# Patient Record
Sex: Female | Born: 1955 | Race: Black or African American | Hispanic: No | Smoking: Former smoker
Health system: Southern US, Community
[De-identification: ages and names within clinical notes are randomized; demographics above are authoritative.]

## PROBLEM LIST (undated history)

## (undated) DIAGNOSIS — N184 Chronic kidney disease, stage 4 (severe): Secondary | ICD-10-CM

## (undated) DIAGNOSIS — M1990 Unspecified osteoarthritis, unspecified site: Secondary | ICD-10-CM

## (undated) DIAGNOSIS — J449 Chronic obstructive pulmonary disease, unspecified: Secondary | ICD-10-CM

## (undated) DIAGNOSIS — E785 Hyperlipidemia, unspecified: Secondary | ICD-10-CM

## (undated) DIAGNOSIS — R05 Cough: Secondary | ICD-10-CM

## (undated) DIAGNOSIS — J209 Acute bronchitis, unspecified: Secondary | ICD-10-CM

## (undated) DIAGNOSIS — R059 Cough, unspecified: Secondary | ICD-10-CM

## (undated) DIAGNOSIS — J45909 Unspecified asthma, uncomplicated: Secondary | ICD-10-CM

## (undated) DIAGNOSIS — E039 Hypothyroidism, unspecified: Secondary | ICD-10-CM

## (undated) DIAGNOSIS — I10 Essential (primary) hypertension: Secondary | ICD-10-CM

## (undated) DIAGNOSIS — G4733 Obstructive sleep apnea (adult) (pediatric): Secondary | ICD-10-CM

## (undated) DIAGNOSIS — Z8669 Personal history of other diseases of the nervous system and sense organs: Secondary | ICD-10-CM

## (undated) DIAGNOSIS — K219 Gastro-esophageal reflux disease without esophagitis: Secondary | ICD-10-CM

## (undated) DIAGNOSIS — Z860100 Personal history of colon polyps, unspecified: Secondary | ICD-10-CM

## (undated) DIAGNOSIS — E119 Type 2 diabetes mellitus without complications: Secondary | ICD-10-CM

## (undated) DIAGNOSIS — D649 Anemia, unspecified: Secondary | ICD-10-CM

## (undated) DIAGNOSIS — G629 Polyneuropathy, unspecified: Secondary | ICD-10-CM

## (undated) DIAGNOSIS — I209 Angina pectoris, unspecified: Secondary | ICD-10-CM

## (undated) HISTORY — DX: Acute bronchitis, unspecified: J20.9

## (undated) HISTORY — PX: COLONOSCOPY: SHX174

## (undated) HISTORY — DX: Obstructive sleep apnea (adult) (pediatric): G47.33

## (undated) HISTORY — PX: TUBAL LIGATION: SHX77

## (undated) HISTORY — DX: Type 2 diabetes mellitus without complications: E11.9

## (undated) HISTORY — PX: ANTERIOR FUSION CERVICAL SPINE: SUR626

## (undated) HISTORY — DX: Cough: R05

## (undated) HISTORY — DX: Cough, unspecified: R05.9

## (undated) HISTORY — PX: CORONARY ANGIOPLASTY: SHX604

## (undated) HISTORY — PX: CARPAL TUNNEL RELEASE: SHX101

## (undated) HISTORY — DX: Hypothyroidism, unspecified: E03.9

## (undated) HISTORY — PX: WISDOM TOOTH EXTRACTION: SHX21

---

## 1988-08-08 HISTORY — PX: TUBAL LIGATION: SHX77

## 1999-08-23 ENCOUNTER — Other Ambulatory Visit: Admission: RE | Admit: 1999-08-23 | Discharge: 1999-08-23 | Payer: Self-pay | Admitting: General Practice

## 1999-10-05 ENCOUNTER — Other Ambulatory Visit: Admission: RE | Admit: 1999-10-05 | Discharge: 1999-10-05 | Payer: Self-pay | Admitting: Gynecology

## 1999-10-05 ENCOUNTER — Encounter (INDEPENDENT_AMBULATORY_CARE_PROVIDER_SITE_OTHER): Payer: Self-pay | Admitting: Specialist

## 2001-10-10 ENCOUNTER — Encounter: Admission: RE | Admit: 2001-10-10 | Discharge: 2001-10-10 | Payer: Self-pay | Admitting: Internal Medicine

## 2003-01-17 ENCOUNTER — Ambulatory Visit (HOSPITAL_BASED_OUTPATIENT_CLINIC_OR_DEPARTMENT_OTHER): Admission: RE | Admit: 2003-01-17 | Discharge: 2003-01-17 | Payer: Self-pay | Admitting: Internal Medicine

## 2003-03-07 ENCOUNTER — Ambulatory Visit (HOSPITAL_COMMUNITY): Admission: RE | Admit: 2003-03-07 | Discharge: 2003-03-07 | Payer: Self-pay | Admitting: Gastroenterology

## 2003-03-07 ENCOUNTER — Ambulatory Visit (HOSPITAL_BASED_OUTPATIENT_CLINIC_OR_DEPARTMENT_OTHER): Admission: RE | Admit: 2003-03-07 | Discharge: 2003-03-07 | Payer: Self-pay | Admitting: Internal Medicine

## 2003-03-07 ENCOUNTER — Encounter (INDEPENDENT_AMBULATORY_CARE_PROVIDER_SITE_OTHER): Payer: Self-pay | Admitting: *Deleted

## 2003-12-22 ENCOUNTER — Ambulatory Visit (HOSPITAL_COMMUNITY): Admission: RE | Admit: 2003-12-22 | Discharge: 2003-12-22 | Payer: Self-pay | Admitting: Gastroenterology

## 2003-12-23 ENCOUNTER — Encounter: Admission: RE | Admit: 2003-12-23 | Discharge: 2003-12-23 | Payer: Self-pay | Admitting: Gastroenterology

## 2004-09-07 ENCOUNTER — Ambulatory Visit: Payer: Self-pay | Admitting: Internal Medicine

## 2004-09-24 ENCOUNTER — Encounter: Admission: RE | Admit: 2004-09-24 | Discharge: 2004-09-24 | Payer: Self-pay | Admitting: Endocrinology

## 2004-11-08 ENCOUNTER — Ambulatory Visit: Payer: Self-pay | Admitting: Internal Medicine

## 2004-12-17 ENCOUNTER — Ambulatory Visit: Payer: Self-pay | Admitting: Internal Medicine

## 2004-12-17 LAB — PULMONARY FUNCTION TEST

## 2005-01-14 ENCOUNTER — Ambulatory Visit: Payer: Self-pay | Admitting: Internal Medicine

## 2005-07-08 ENCOUNTER — Ambulatory Visit: Payer: Self-pay | Admitting: Internal Medicine

## 2005-08-08 HISTORY — PX: CORONARY STENT INTERVENTION: CATH118234

## 2005-12-09 ENCOUNTER — Ambulatory Visit: Payer: Self-pay | Admitting: Internal Medicine

## 2006-05-18 ENCOUNTER — Ambulatory Visit (HOSPITAL_COMMUNITY): Admission: AD | Admit: 2006-05-18 | Discharge: 2006-05-19 | Payer: Self-pay | Admitting: Cardiovascular Disease

## 2006-06-09 ENCOUNTER — Ambulatory Visit: Payer: Self-pay | Admitting: Internal Medicine

## 2006-12-07 ENCOUNTER — Ambulatory Visit: Payer: Self-pay | Admitting: Internal Medicine

## 2007-05-09 ENCOUNTER — Ambulatory Visit: Payer: Self-pay | Admitting: Pulmonary Disease

## 2007-05-28 DIAGNOSIS — I2510 Atherosclerotic heart disease of native coronary artery without angina pectoris: Secondary | ICD-10-CM | POA: Insufficient documentation

## 2007-05-28 DIAGNOSIS — E119 Type 2 diabetes mellitus without complications: Secondary | ICD-10-CM | POA: Insufficient documentation

## 2007-05-28 DIAGNOSIS — J209 Acute bronchitis, unspecified: Secondary | ICD-10-CM | POA: Insufficient documentation

## 2007-05-28 DIAGNOSIS — J302 Other seasonal allergic rhinitis: Secondary | ICD-10-CM | POA: Insufficient documentation

## 2007-05-28 DIAGNOSIS — R059 Cough, unspecified: Secondary | ICD-10-CM | POA: Insufficient documentation

## 2007-05-28 DIAGNOSIS — R05 Cough: Secondary | ICD-10-CM | POA: Insufficient documentation

## 2007-05-28 DIAGNOSIS — J3089 Other allergic rhinitis: Secondary | ICD-10-CM

## 2007-05-28 DIAGNOSIS — G4733 Obstructive sleep apnea (adult) (pediatric): Secondary | ICD-10-CM | POA: Insufficient documentation

## 2007-07-04 ENCOUNTER — Ambulatory Visit: Payer: Self-pay | Admitting: Internal Medicine

## 2007-08-09 HISTORY — PX: ANTERIOR FUSION CERVICAL SPINE: SUR626

## 2007-11-08 ENCOUNTER — Encounter: Payer: Self-pay | Admitting: Internal Medicine

## 2007-11-09 ENCOUNTER — Telehealth: Payer: Self-pay | Admitting: Internal Medicine

## 2007-12-10 ENCOUNTER — Ambulatory Visit: Payer: Self-pay | Admitting: Internal Medicine

## 2007-12-10 DIAGNOSIS — E039 Hypothyroidism, unspecified: Secondary | ICD-10-CM | POA: Insufficient documentation

## 2008-01-04 ENCOUNTER — Encounter: Payer: Self-pay | Admitting: Internal Medicine

## 2008-03-26 ENCOUNTER — Ambulatory Visit (HOSPITAL_COMMUNITY): Admission: RE | Admit: 2008-03-26 | Discharge: 2008-03-27 | Payer: Self-pay | Admitting: Neurological Surgery

## 2008-03-28 ENCOUNTER — Telehealth: Payer: Self-pay | Admitting: Internal Medicine

## 2008-04-29 ENCOUNTER — Encounter: Admission: RE | Admit: 2008-04-29 | Discharge: 2008-04-29 | Payer: Self-pay | Admitting: Neurological Surgery

## 2008-05-31 ENCOUNTER — Inpatient Hospital Stay (HOSPITAL_COMMUNITY): Admission: EM | Admit: 2008-05-31 | Discharge: 2008-06-05 | Payer: Self-pay | Admitting: Internal Medicine

## 2008-05-31 ENCOUNTER — Encounter: Payer: Self-pay | Admitting: Emergency Medicine

## 2008-05-31 ENCOUNTER — Ambulatory Visit: Payer: Self-pay | Admitting: Internal Medicine

## 2008-06-05 ENCOUNTER — Encounter: Payer: Self-pay | Admitting: Internal Medicine

## 2008-06-09 ENCOUNTER — Ambulatory Visit: Payer: Self-pay | Admitting: Internal Medicine

## 2008-06-24 ENCOUNTER — Encounter: Admission: RE | Admit: 2008-06-24 | Discharge: 2008-06-24 | Payer: Self-pay | Admitting: Neurological Surgery

## 2008-06-28 ENCOUNTER — Encounter: Admission: RE | Admit: 2008-06-28 | Discharge: 2008-06-28 | Payer: Self-pay | Admitting: Neurological Surgery

## 2008-07-11 ENCOUNTER — Telehealth: Payer: Self-pay | Admitting: Internal Medicine

## 2008-08-07 ENCOUNTER — Other Ambulatory Visit: Admission: RE | Admit: 2008-08-07 | Discharge: 2008-08-07 | Payer: Self-pay | Admitting: Gynecology

## 2008-09-15 ENCOUNTER — Telehealth (INDEPENDENT_AMBULATORY_CARE_PROVIDER_SITE_OTHER): Payer: Self-pay | Admitting: *Deleted

## 2008-11-06 ENCOUNTER — Telehealth: Payer: Self-pay | Admitting: Internal Medicine

## 2008-12-08 ENCOUNTER — Ambulatory Visit: Payer: Self-pay | Admitting: Internal Medicine

## 2009-01-07 ENCOUNTER — Encounter: Payer: Self-pay | Admitting: Internal Medicine

## 2009-01-13 ENCOUNTER — Telehealth: Payer: Self-pay | Admitting: Internal Medicine

## 2009-01-22 ENCOUNTER — Telehealth (INDEPENDENT_AMBULATORY_CARE_PROVIDER_SITE_OTHER): Payer: Self-pay | Admitting: *Deleted

## 2009-01-22 ENCOUNTER — Ambulatory Visit: Payer: Self-pay | Admitting: Internal Medicine

## 2009-02-12 ENCOUNTER — Encounter: Admission: RE | Admit: 2009-02-12 | Discharge: 2009-02-12 | Payer: Self-pay | Admitting: Cardiology

## 2009-02-13 ENCOUNTER — Ambulatory Visit (HOSPITAL_COMMUNITY): Admission: RE | Admit: 2009-02-13 | Discharge: 2009-02-13 | Payer: Self-pay | Admitting: Cardiovascular Disease

## 2009-04-03 ENCOUNTER — Telehealth: Payer: Self-pay | Admitting: Internal Medicine

## 2009-04-07 ENCOUNTER — Ambulatory Visit: Payer: Self-pay | Admitting: Internal Medicine

## 2009-04-07 DIAGNOSIS — J019 Acute sinusitis, unspecified: Secondary | ICD-10-CM | POA: Insufficient documentation

## 2009-05-04 ENCOUNTER — Telehealth: Payer: Self-pay | Admitting: Internal Medicine

## 2009-05-11 ENCOUNTER — Ambulatory Visit: Payer: Self-pay | Admitting: Internal Medicine

## 2009-06-19 ENCOUNTER — Ambulatory Visit: Payer: Self-pay | Admitting: Diagnostic Radiology

## 2009-06-19 ENCOUNTER — Ambulatory Visit (HOSPITAL_BASED_OUTPATIENT_CLINIC_OR_DEPARTMENT_OTHER): Admission: RE | Admit: 2009-06-19 | Discharge: 2009-06-19 | Payer: Self-pay | Admitting: Endocrinology

## 2009-07-01 ENCOUNTER — Telehealth: Payer: Self-pay | Admitting: Internal Medicine

## 2009-07-10 ENCOUNTER — Telehealth (INDEPENDENT_AMBULATORY_CARE_PROVIDER_SITE_OTHER): Payer: Self-pay | Admitting: *Deleted

## 2009-08-10 ENCOUNTER — Telehealth (INDEPENDENT_AMBULATORY_CARE_PROVIDER_SITE_OTHER): Payer: Self-pay | Admitting: *Deleted

## 2009-09-08 ENCOUNTER — Telehealth (INDEPENDENT_AMBULATORY_CARE_PROVIDER_SITE_OTHER): Payer: Self-pay | Admitting: *Deleted

## 2009-09-25 ENCOUNTER — Telehealth: Payer: Self-pay | Admitting: Internal Medicine

## 2009-11-09 ENCOUNTER — Ambulatory Visit: Payer: Self-pay | Admitting: Internal Medicine

## 2010-01-07 ENCOUNTER — Telehealth (INDEPENDENT_AMBULATORY_CARE_PROVIDER_SITE_OTHER): Payer: Self-pay | Admitting: *Deleted

## 2010-02-03 ENCOUNTER — Telehealth: Payer: Self-pay | Admitting: Internal Medicine

## 2010-03-08 ENCOUNTER — Telehealth (INDEPENDENT_AMBULATORY_CARE_PROVIDER_SITE_OTHER): Payer: Self-pay | Admitting: *Deleted

## 2010-05-10 ENCOUNTER — Ambulatory Visit: Payer: Self-pay | Admitting: Internal Medicine

## 2010-07-09 ENCOUNTER — Telehealth: Payer: Self-pay | Admitting: Internal Medicine

## 2010-08-29 ENCOUNTER — Encounter: Payer: Self-pay | Admitting: Endocrinology

## 2010-09-07 NOTE — Progress Notes (Signed)
Summary: wanting cough syrup  Phone Note Call from Patient Call back at Home Phone [phone]   Caller: Patient Call For: young Reason for Call: Refill Medication Summary of Call: patient is wanting her cough syrup refilled.  hydromet Initial call taken by: Adin Hector,  September 08, 2009 12:29 PM  Follow-up for Phone Call        pt last had rx filled 08-10-2009 for # 363ml x 0 refills.  Please advise. Matthew Folks LPN  February  1, 624THL 1:27 PM   Additional Follow-up for Phone Call Additional follow up Details #1::        Ok this time and one refill, but she must make this last until her next appt in April. Additional Follow-up by: Deneise Lever MD,  September 08, 2009 1:47 PM    Additional Follow-up for Phone Call Additional follow up Details #2::    called, spoke with pt.  Pt informed of above statement per CY and requesting med to be called into [facility].  Med called in-pt aware.  Raymondo Band RN  September 08, 2009 2:02 PM   Prescriptions: HYDROMET 5-1.5 MG/5ML  SYRP (HYDROCODONE-HOMATROPINE) take one teaspoonful every 6 hours as needed  #300 ML x 1   Entered by:   Raymondo Band RN   Authorized by:   Deneise Lever MD   Signed by:   Raymondo Band RN on 09/08/2009   Method used:   Telephoned to ...       [id number] (retail)       [address]       [place],   [address]       Ph: [phone] or [phone]       Fax: [phone]   RxID:   [id number]

## 2010-09-07 NOTE — Progress Notes (Signed)
Summary: rx  Phone Note Call from Patient Call back at Home Phone [phone]   Caller: Patient Call For: Armanie Martine Reason for Call: Talk to Nurse Summary of Call: pt would like her Hydromet cough syrup called in.  She realizes it's a little early, but she doesn't want to wait due to Holiday and she doesnt want to run out over w/e. [facility] [place] Initial call taken by: Zigmund Gottron,  February 03, 2010 10:24 AM  Follow-up for Phone Call        Pt just had hydromet filled on 6/2 # 300 ml.  She is requesting early refill so she does not run out over the w/e since it's a holiday.  Please advise thanks Follow-up by: Tilden Dome,  February 03, 2010 10:31 AM  Additional Follow-up for Phone Call Additional follow up Details #1::        per CY---ok for refill of the hydromet.  this has been called into the pharmacy. Elita Boone CMA  February 03, 2010 1:25 PM     Prescriptions: HYDROMET 5-1.5 MG/5ML  SYRP (HYDROCODONE-HOMATROPINE) take one teaspoonful every 6 hours as needed  #300 ML x 0   Entered by:   Elita Boone CMA   Authorized by:   Deneise Lever MD   Signed by:   Elita Boone CMA on 02/03/2010   Method used:   Telephoned to ...       [id number] (retail)       [address]       [place], [place]  [address]       Ph: [phone] or [phone]       Fax: [phone]   RxID:   [id number]

## 2010-09-07 NOTE — Assessment & Plan Note (Signed)
Summary: rov 6 months///kp   Primary Provider/Referring Provider:  [place]  CC:  6 month follow up visit-sinus headache.  History of Present Illness: 01/22/09- Chronic cough, allergic rhinitis, asthmatic bronchitis, hx OSA 2 weeks dry cough, nasal congestion, burning in chest "like getting bronchitis" without palpitation, radiation or exertional component, fever or change in her chronic hormonal flashes. Denies nausea or vomiting and disputes my questions that this could be reflux.. Easily winded with exertion. She usedup her cough syrup prematurely- discussed. April 07, 2009--Presents for an acute office visit. Complains of hoarseness, sinus congestion/pressure with dark green mucus tinged with bright red blood, PND with sore throat and a sensation that she has something in her throat x10days. Using mucinex without much help. Dizziness w/ bending over. Denies chest pain.  May 11, 2009-  Chronic cough, allergic rhinitis, asthmatic bronchitis, hx OSA Since treated here in August with Augmentin by the NP, she still feels tender pressure ache left paranasal area and can pull a little bloody tinged mucus form nose. No longer blowing brown. Some pain behind left ear and mild vertigo if she blows her nose. Cough persists. If she doesn't take cough syrup she feels a tickle low throat and rough "bronchitis" sense in mid chest. Easily dyspneic climbing stairs without change over past year. She had another negative heart cath by Dr Claiborne Billings because of unexplained left anterior chest pains. She denies any sense of reflux or heart burn.  November 09, 2009 - Chronic cough, allergic rhinitis, asthmatic bronchitis, hx OSA Cpap compliance good- every night-13 cwp. Good control. Uses Advair daily and hasn't needed rescue inhaler in 2 months. Got through winter without significant respiratory infection. Today complains of left maxillary headache/ pressure with little discharge. She is using mucinex and neti  pot. Chronic cough persists. pattern has not changed. Currently it is less obtrusive and she is making cough syrup last.  Current Medications (verified): 1)  Synthroid 175 Mcg  Tabs (Levothyroxine Sodium) .... One Once Daily 2)  Verapamil Hcl Cr 240 Mg  Cp24 (Verapamil Hcl) .... Take 1 Tablet By Mouth Once A Day 3)  Voltaren 75 Mg  Tbec (Diclofenac Sodium) .... 2 Times Weekly As Needed 4)  Advair Diskus 100-50 Mcg/dose  Misc (Fluticasone-Salmeterol) .... Use Twice Daily = Rinse Mouth Well After Use 5)  Cpap 13 Cwp 6)  Nexium 40 Mg  Cpdr (Esomeprazole Magnesium) .Marland Kitchen.. 1 Once Daily 7)  Vytorin 10-80 Mg Tabs (Ezetimibe-Simvastatin) .... Take 1 By Mouth Once Daily 8)  Proair Hfa 108 (90 Base) Mcg/act Aers (Albuterol Sulfate) .... Inhale 2 Puffs Every Four Hours As Needed 9)  Hydromet 5-1.5 Mg/42ml  Syrp (Hydrocodone-Homatropine) .... Take One Teaspoonful Every 6 Hours As Needed 10)  Plavix 75 Mg  Tabs (Clopidogrel Bisulfate) .... Once Daily 11)  Triamterene-Hctz 37.5-25 Mg Tabs (Triamterene-Hctz) .... Take 1 By Mouth Once Daily 12)  Fexofenadine Hcl 180 Mg Tabs (Fexofenadine Hcl) .... Take 1 Tablet By Mouth Once A Day 13)  Mucinex 600 Mg Xr12h-Tab (Guaifenesin) .... Take As Directed 14)  Toprol Xl 25 Mg Xr24h-Tab (Metoprolol Succinate) .... Take 1 By Mouth At Bedtime 15)  Fish Oil 1000 Mg Caps (Omega-3 Fatty Acids) .... 2 By Mouth Two Times A Day 16)  Metformin Hcl 1000 Mg Tabs (Metformin Hcl) .Marland Kitchen.. 1 Two Times A Day 17)  Augmentin 875-125 Mg Tabs (Amoxicillin-Pot Clavulanate) .Marland Kitchen.. 1 By Mouth Two Times A Day 18)  Victoza 18 Mg/74ml Soln (Liraglutide) .... Use As Directed  Allergies (verified): 1)  !  Sulfa 2)  ! 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  Risk Factors: Smoking Status: quit (12/10/2007)  Family History: Mother- died hypertensive heart disease age 43 Father- died hip fracture and pneumonia age 110, with prostate cancer  Review of Systems      See HPI       The patient complains of prolonged cough and headaches.  The patient denies anorexia, fever, weight loss, weight gain, vision loss, decreased hearing, hoarseness, chest pain, syncope, dyspnea on exertion, peripheral edema, hemoptysis, abdominal pain, and severe indigestion/heartburn.    Vital Signs:  Patient profile:   55 year old female Height:      62 inches Weight:      168.13 pounds BMI:     30.86 O2 Sat:      98 % on Room air Pulse rate:   81 / minute BP sitting:   126 / 76  (left arm) Cuff size:   regular  Vitals Entered By: Clayborne Dana CMA December 04, 2009 10:05 AM)  O2 Flow:  Room air  Physical Exam  Additional Exam:  General: A/Ox3; pleasant and cooperative, NAD, overweight SKIN: no rash, lesions NODES: no lymphadenopathy HEENT: /AT, EOM- WNL, M-dull, especially on right, but not retracted, don't see fluid., Nose- turbinates pale, swollen, shiney,  Throat- .voice normal without stridor or erythema, Mallampati III, Tongue and mucosa look normal, NECK: Supple w/ fair ROM, JVD- none, normal carotid impulses w/o  bruits Thyroid-  CHEST: Clear to P&A, without wheeze or rhonchi heard, no cough while here HEART: RRR, no m/g/r heard ABDOMEN:soft [id number], nl pulses, no edema  NEURO: Grossly intact to observation      Impression & Recommendations:  Problem # 1:  SINUSITIS, ACUTE (ICD-461.9)  Acute maxillary sinusitis. Suspect original problem was allergic rhinitis causing obstruction. We will give neb and depo with augmentin. Her updated medication list for this problem includes:    Hydromet 5-1.5 Mg/42ml Syrp (Hydrocodone-homatropine) .Marland Kitchen... Take one teaspoonful every 6 hours as needed    Mucinex 600 Mg Xr12h-tab (Guaifenesin) .Marland Kitchen... Take as directed    Augmentin 875-125 Mg Tabs (Amoxicillin-pot clavulanate) .Marland Kitchen... 1 by mouth two times a day    Augmentin 875-125 Mg Tabs (Amoxicillin-pot clavulanate) .Marland Kitchen... 1 twice daily  Problem # 2:  OBSTRUCTIVE SLEEP APNEA (ICD-327.23) Compliant with CPAP with good control.  Problem # 3:  COUGH, CHRONIC (ICD-786.2) Chronic cough, multifactorial and nonspecific by previous workup. We continue to emphasize careful use of narcotic cough syrup.  Medications Added to Medication List This Visit: 1)  Vytorin 10-80 Mg Tabs (Ezetimibe-simvastatin) .... Take 1 by mouth once daily 2)  Triamterene-hctz 37.5-25 Mg Tabs (Triamterene-hctz) .... Take 1 by mouth once daily 3)  Augmentin 875-125 Mg Tabs (Amoxicillin-pot clavulanate) .Marland Kitchen.. 1 twice daily  Other Orders: Admin of  Therapeutic Inj  intramuscular or subcutaneous [id number]) Depo- Medrol 80mg  (J1040) Nebulizer Tx [id number])  Patient Instructions: 1)  Please schedule a follow-up appointment in 6 months. 2)  Neb neo 3)  Script for cough syrup 4)  Script for antibiotic sent to your drug store 5)  Continue with Neti pot and mucinex. A decongestant like otc Sudafed-PE may also help. Prescriptions: HYDROMET 5-1.5 MG/5ML  SYRP (HYDROCODONE-HOMATROPINE) take one teaspoonful every 6 hours as needed  #300 ML x 1   Entered and  Authorized by:   Deneise Lever MD   Signed by:   Deneise Lever MD on 11/09/2009   Method used:   Print then Give to Patient   RxID:   [id number] AUGMENTIN 875-125 MG TABS (AMOXICILLIN-POT CLAVULANATE) 1 twice daily  #14 x 0   Entered and Authorized by:   Deneise Lever MD   Signed by:   Deneise Lever MD on 11/09/2009   Method used:   Electronically to        [id number] (retail)       [address]       [place], [place]  [address]       Ph: [phone] or [phone]       Fax: [phone]   RxID:   [id number]      Medication Administration  Injection # 1:    Medication: Depo- Medrol 80mg     Diagnosis: SINUSITIS, ACUTE (ICD-461.9)    Route: SQ    Site: RUOQ gluteus    Exp Date: 06/2012    Lot #: [id number]    Mfr: Pharmacia    Patient tolerated injection without complications    Given by: Clayborne Dana CMA (November 09, 2009 1:47 PM)  Medication # 1:    Medication: EMR miscellaneous medications    Diagnosis: SINUSITIS, ACUTE (ICD-461.9)    Dose: 3 drops    Route: intranasal    Exp Date: 12/2010    Lot #: [id number]    Mfr: bayer    Comments: Neo-Synephrine.    Patient tolerated medication without complications    Given by: Clayborne Dana CMA (November 09, 2009 1:47 PM)  Orders Added: 1)  Admin of Therapeutic Inj  intramuscular or subcutaneous [[id number]] 2)  Depo- Medrol 80mg  [J1040] 3)  Nebulizer Tx [id number]

## 2010-09-07 NOTE — Progress Notes (Signed)
Summary: rx  Phone Note Call from Patient Call back at Home Phone [phone]   Caller: Patient Call For: young Reason for Call: Refill Medication, Talk to Nurse Summary of Call: wants a refill on Hydrocodone cough syrup. [facility] [phone] Initial call taken by: Zigmund Gottron,  August 10, 2009 11:37 AM  Follow-up for Phone Call        please advise if Okay to refill. Last filled on 07-01-09. Monterey Bing CMA  August 10, 2009 11:39 AM   Additional Follow-up for Phone Call Additional follow up Details #1::        Per CDY- ok to fill this time. Clayborne Dana CMA  August 10, 2009 12:42 PM   cough syrup called to pharmacy and pt aware Additional Follow-up by: Maryann Conners CMA,  August 10, 2009 2:00 PM    Prescriptions: HYDROMET 5-1.5 MG/5ML  SYRP (HYDROCODONE-HOMATROPINE) take one teaspoonful every 6 hours as needed  #300 ML x 0   Entered by:   Maryann Conners CMA   Authorized by:   Deneise Lever MD   Signed by:   Maryann Conners CMA on 08/10/2009   Method used:   Telephoned to ...       [id number] (retail)       [address]       [place], [place]  [address]       Ph: [phone] or [phone]       Fax: [phone]   RxID:   [id number]

## 2010-09-07 NOTE — Progress Notes (Signed)
Summary: rx sub  Phone Note Call from Patient Call back at Home Phone [phone]   Caller: Patient Call For: Khrystyne Arpin Summary of Call: pt states that the allegra D is no longer covered by her insurance. says that they will cover fexofena HCL. [facility] [place].  Initial call taken by: Cooper Render, CNA,  September 25, 2009 2:54 PM  Follow-up for Phone Call        please advise if ok to change from allegra to fexofenadine hcl. If so what strength and directions. Thanks. Noble Bing CMA  September 25, 2009 3:29 PM   Additional Follow-up for Phone Call Additional follow up Details #1::        Per CDY- fexofenadine 180mg  #30 take 1 by mouth once daily refill as needed Clayborne Dana CMA  September 25, 2009 4:33 PM    called and spoke with pt and she is aware of rx sent to her pharmacy [facility]  September 25, 2009 4:36 PM     New/Updated Medications: FEXOFENADINE HCL 180 MG TABS (FEXOFENADINE HCL) Take 1 tablet by mouth once a day Prescriptions: FEXOFENADINE HCL 180 MG TABS (FEXOFENADINE HCL) Take 1 tablet by mouth once a day  #30 x 5   Entered by:   Elita Boone CMA   Authorized by:   Deneise Lever MD   Signed by:   Elita Boone CMA on 09/25/2009   Method used:   Electronically to        [id number] (retail)       [address]       [place], [place]  [address]       Ph: [phone] or [phone]       Fax: [phone]   RxID:   [id number]

## 2010-09-07 NOTE — Progress Notes (Signed)
Summary: hydromet refill//  Phone Note Call from Patient Call back at Home Phone [phone]   Caller: Patient Call For: young Reason for Call: Refill Medication Summary of Call: Requests refill on hydrocodone ok to [facility].//[facility] Initial call taken by: Netta Neat,  July 09, 2010 1:33 PM  Follow-up for Phone Call        Called, spoke with pt.  She states since last OV on 05/10/10 with CY, she is still having problems with bronchitis.  States she was seen by PCP at the end of oct and was given another abx and a prednisone shot and in the middle of November, PCP called in another abx.  States she is still coughing despite this.  States at times "I cough and can't quit."  Cough is prod at times with green mucus.  States she has appt with PCP next wk and is planning on discussing this with him then but in the meantime requesting rx for hydromet.  Dr. Annamaria Boots, pls advise if this is ok.  Thanks! Follow-up by: Raymondo Band RN,  July 09, 2010 3:44 PM  Additional Follow-up for Phone Call Additional follow up Details #1::        Per CDY-okay to refill Hydromet cough syrup 3360ml 1 tsp qid as needed no refills.Clayborne Dana CMA  July 09, 2010 4:00 PM   pt aware rx sent. Verdon Bing CMA  July 09, 2010 4:30 PM     Prescriptions: HYDROMET 5-1.5 MG/5ML  SYRP (HYDROCODONE-HOMATROPINE) take one teaspoonful every 6 hours as needed  #300 ML x 0   Entered by:   Charma Igo   Authorized by:   Deneise Lever MD   Signed by:   Charma Igo on 07/09/2010   Method used:   Historical   [id number]  [facility]. attempt to call pt to inform her of this. lmomtcb x1 Mindy Silva  July 09, 2010 4:14 PM

## 2010-09-07 NOTE — Progress Notes (Signed)
Summary: rx req  Phone Note Call from Patient Call back at Home Phone [phone]   Caller: Patient Call For: [name] Summary of Call: pt requests a refill of hydromet cough syrup. also requests a rx for diflucan- yeast in mouth/ tongue. [facility] on [address] in [place].  Initial call taken by: Cooper Render, CNA,  January 07, 2010 1:07 PM  Follow-up for Phone Call        called and spoke with pt.  pt requesting refill on Hydromet.  Last filled on 11-09-2009 for # 353ml x 1 refill.  Pt c/o non-productive cough but will occ cough up clear sputum.  Pt also requests rx for Diflucan.   Pt states she has thrush in her mouth d/t Advair.  pt c/o tongue sore and white patches on tongue.  Please advise.  Thanks.  Megan Reynolds LPN  June  2, 624THL D34-534 PM     Additional Follow-up for Phone Call Additional follow up Details #1::        1) OK to refill Hydromet x 1  2) OK to send diflucan generic, 150 mg, # 7,  1 daily x 7 days, no refill. Additional Follow-up by: Deneise Lever MD,  January 07, 2010 1:33 PM    Additional Follow-up for Phone Call Additional follow up Details #2::    Rxs were phoned to [facility] in [place].  Spoke with pt and advised that this was done. Follow-up by: Tilden Dome,  January 07, 2010 1:58 PM  New/Updated Medications: FLUCONAZOLE 150 MG TABS (FLUCONAZOLE) 1 by mouth once daily x 7 days Prescriptions: FLUCONAZOLE 150 MG TABS (FLUCONAZOLE) 1 by mouth once daily x 7 days  #7 x 0   Entered by:   Tilden Dome   Authorized by:   Deneise Lever MD   Signed by:   Tilden Dome on 01/07/2010   Method used:   Telephoned to ...       [facility] (retail)       [address]       [place], [place]  [address]       Ph: [phone] or [phone]       Fax: [phone]   RxID:   [id number] HYDROMET 5-1.5 MG/5ML  SYRP (HYDROCODONE-HOMATROPINE) take one teaspoonful every 6 hours as needed  #300 ML x 0   Entered by:   Tilden Dome  Authorized by:   Deneise Lever MD   Signed by:   Tilden Dome on 01/07/2010   Method used:   Telephoned to ...       [facility] (retail)       [address]       [place], [place]  [address]       Ph: [phone] or [phone]       Fax: [phone]   RxID:   [id number]

## 2010-09-07 NOTE — Assessment & Plan Note (Signed)
Summary: rov 6 months///kp   Primary Provider/Referring Provider:  [place]  CC:  6 month follow up visit-recent Bronchitis.Marland Kitchen  History of Present Illness: May 11, 2009-  Chronic cough, allergic rhinitis, asthmatic bronchitis, hx OSA Since treated here in August with Augmentin by the NP, she still feels tender pressure ache left paranasal area and can pull a little bloody tinged mucus form nose. No longer blowing brown. Some pain behind left ear and mild vertigo if she blows her nose. Cough persists. If she doesn't take cough syrup she feels a tickle low throat and rough "bronchitis" sense in mid chest. Easily dyspneic climbing stairs without change over past year. She had another negative heart cath by Dr Claiborne Billings because of unexplained left anterior chest pains. She denies any sense of reflux or heart burn.  November 09, 2009 - Chronic cough, allergic rhinitis, asthmatic bronchitis, hx OSA Cpap compliance good- every night-13 cwp. Good control. Uses Advair daily and hasn't needed rescue inhaler in 2 months. Got through winter without significant respiratory infection. Today complains of left maxillary headache/ pressure with little discharge. She is using mucinex and neti pot. Chronic cough persists. pattern has not changed. Currently it is less obtrusive and she is making cough syrup last.  May 10, 2010- Chronic cough, allergic rhinitis, asthmatic bronchitis Acute visit- cc Bronchitis. She is driving a school bus. Child was coughing on her all week. By Thursday 5 days ago she felt a cold coming on. Next day she went to her CP, Dr Forde Dandy and was givien augmentin, a neb tx, prednisone taper. Chest tight and burns, nonproductive, no GI upset. Scant suput and sinus mucus are green. No fever.   Preventive Screening-Counseling & Management  Alcohol-Tobacco     Smoking Status: quit     Year Quit: 2007  Current Medications (verified): 1)  Synthroid 175 Mcg  Tabs (Levothyroxine Sodium) .... One  Once Daily 2)  Verapamil Hcl Cr 240 Mg  Cp24 (Verapamil Hcl) .... Take 1 Tablet By Mouth Once A Day 3)  Voltaren 75 Mg  Tbec (Diclofenac Sodium) .... 2 Times Weekly As Needed 4)  Advair Diskus 100-50 Mcg/dose  Misc (Fluticasone-Salmeterol) .... Use Twice Daily = Rinse Mouth Well After Use 5)  Cpap 13 Cwp 6)  Nexium 40 Mg  Cpdr (Esomeprazole Magnesium) .Marland Kitchen.. 1 Once Daily 7)  Vytorin 10-80 Mg Tabs (Ezetimibe-Simvastatin) .... Take 1 By Mouth Once Daily 8)  Proair Hfa 108 (90 Base) Mcg/act Aers (Albuterol Sulfate) .... Inhale 2 Puffs Every Four Hours As Needed 9)  Hydromet 5-1.5 Mg/14ml  Syrp (Hydrocodone-Homatropine) .... Take One Teaspoonful Every 6 Hours As Needed 10)  Plavix 75 Mg  Tabs (Clopidogrel Bisulfate) .... Once Daily 11)  Triamterene-Hctz 37.5-25 Mg Tabs (Triamterene-Hctz) .... Take 1 By Mouth Once Daily 12)  Fexofenadine Hcl 180 Mg Tabs (Fexofenadine Hcl) .... Take 1 Tablet By Mouth Once A Day 13)  Mucinex 600 Mg Xr12h-Tab (Guaifenesin) .... Take As Directed 14)  Toprol Xl 25 Mg Xr24h-Tab (Metoprolol Succinate) .... Take 1 By Mouth At Bedtime 15)  Fish Oil 1000 Mg Caps (Omega-3 Fatty Acids) .... 2 By Mouth Two Times A Day 16)  Metformin Hcl 1000 Mg Tabs (Metformin Hcl) .Marland Kitchen.. 1 Two Times A Day 17)  Victoza 18 Mg/23ml Soln (Liraglutide) .... Use As Directed  Allergies (verified): 1)  ! Sulfa 2)  ! Codeine  Past History:  Past Medical History: Last updated: 06/09/2008 HYPOTHYROIDISM (ICD-244.9) COUGH, CHRONIC (ICD-786.2) ASTHMATIC BRONCHITIS, ACUTE (ICD-466.0) CAD (ICD-414.00) OBSTRUCTIVE  SLEEP APNEA (ICD-327.23) DIABETES MELLITUS, TYPE II (ICD-250.00) ALLERGIC RHINITIS (ICD-477.9) degenerative disk disease in the cervical spine requiring Neurontin Hosp pneumonia 10/09  Past Surgical History: Last updated: 06/09/2008 tubal ligation surgery on her toes wisdom teeth extracted Anterior cervical spine fusion- 03/26/08  Family History: Last updated: 11/23/2009 Mother- died  hypertensive heart disease age 29 Father- died hip fracture and pneumonia age 61, with prostate cancer  Social History: Last updated: 04/07/2009 Patient states former smoker; x42yrs quit in 2005; 1ppd.  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  Risk Factors: Smoking Status: quit (05/10/2010)  Review of Systems      See HPI       The patient complains of shortness of breath with activity, non-productive cough, sore throat, and nasal congestion/difficulty breathing through nose.  The patient denies shortness of breath at rest, coughing up blood, chest pain, irregular heartbeats, acid heartburn, indigestion, loss of appetite, weight change, abdominal pain, difficulty swallowing, tooth/dental problems, headaches, sneezing, itching, ear ache, and change in color of mucus.    Vital Signs:  Patient profile:   55 year old female Height:      62 inches Weight:      156.50 pounds BMI:     28.73 O2 Sat:      97 % on Room air Pulse rate:   78 / minute BP sitting:   150 / 80  (right arm) Cuff size:   regular  Vitals Entered By: Clayborne Dana CMA (May 10, 2010 9:29 AM)  O2 Flow:  Room air CC: 6 month follow up visit-recent Bronchitis.   Physical Exam  Additional Exam:  General: A/Ox3; pleasant and cooperative, NAD, overweight SKIN: no rash, lesions NODES: no lymphadenopathy HEENT: [place]/AT, EOM- WNL, M-dull, especially on right, but not retracted, don't see fluid., Nose- turbinates pale, mucus bridging,  Throat- .voice normal without stridor or erythema, Mallampati III, Tongue and mucosa look normal, NECK: Supple w/ fair ROM, JVD- none, normal carotid impulses w/o bruits Thyroid-  CHEST: Bilateral upper zone wheeze, unlabored. No rhonchii HEART: RRR, no m/g/r heard ABDOMEN:soft [id number], nl pulses, no edema  NEURO: Grossly intact to observation      Impression & Recommendations:  Problem #  1:  ASTHMATIC BRONCHITIS, ACUTE (ICD-466.0)  This began as a viral illness, but the green suggests she might respond to doxycycline and a neb with depo. She is prone to yeast vaginitis and I will give diflucan to hold.  The following medications were removed from the medication list:    Augmentin 875-125 Mg Tabs (Amoxicillin-pot clavulanate) .Marland Kitchen... 1 by mouth two times a day    Augmentin 875-125 Mg Tabs (Amoxicillin-pot clavulanate) .Marland Kitchen... 1 twice daily Her updated medication list for this problem includes:    Advair Diskus 100-50 Mcg/dose Misc (Fluticasone-salmeterol) ..... Use twice daily = rinse mouth well after use    Proair Hfa 108 (90 Base) Mcg/act Aers (Albuterol sulfate) ..... Inhale 2 puffs every four hours as needed    Hydromet 5-1.5 Mg/37ml Syrp (Hydrocodone-homatropine) .Marland Kitchen... Take one teaspoonful every 6 hours as needed    Mucinex 600 Mg Xr12h-tab (Guaifenesin) .Marland Kitchen... Take as directed    Doxycycline Hyclate 100 Mg Caps (Doxycycline hyclate) .Marland Kitchen... 2 today then one daily  Problem # 2:  SINUSITIS, ACUTE (ICD-461.9)  She is headed toward a sinusitis but not definitie yet. Discussed need someties to treat longer , depending how she does.  The following medications were  removed from the medication list:    Augmentin 875-125 Mg Tabs (Amoxicillin-pot clavulanate) .Marland Kitchen... 1 by mouth two times a day    Augmentin 875-125 Mg Tabs (Amoxicillin-pot clavulanate) .Marland Kitchen... 1 twice daily Her updated medication list for this problem includes:    Hydromet 5-1.5 Mg/37ml Syrp (Hydrocodone-homatropine) .Marland Kitchen... Take one teaspoonful every 6 hours as needed    Mucinex 600 Mg Xr12h-tab (Guaifenesin) .Marland Kitchen... Take as directed    Doxycycline Hyclate 100 Mg Caps (Doxycycline hyclate) .Marland Kitchen... 2 today then one daily  Problem # 3:  OBSTRUCTIVE SLEEP APNEA (ICD-327.23)  She continues to use CPAP alll night every night  Medications Added to Medication List This Visit: 1)  Doxycycline Hyclate 100 Mg Caps (Doxycycline hyclate)  .... 2 today then one daily 2)  Fluconazole 150 Mg Tabs (Fluconazole) .Marland Kitchen.. 1 daily for yeast  Other Orders: Est. Patient Level IV [id number]) Prescription Created Electronically [phone]) Admin of Therapeutic Inj  intramuscular or subcutaneous [id number]) Depo- Medrol 80mg  (J1040) Nebulizer Tx [id number])  Patient Instructions: 1)  Please schedule a follow-up appointment in 6 months. 2)  neb xop 1.25 3)  depo 80 4)  Sent script for antibiotic doxycycline 5)  Print scripts for diflucan/ fluconazole, and for cough syrup Prescriptions: HYDROMET 5-1.5 MG/5ML  SYRP (HYDROCODONE-HOMATROPINE) take one teaspoonful every 6 hours as needed  #300 ML x 0   Entered and Authorized by:   Deneise Lever MD   Signed by:   Deneise Lever MD on 05/10/2010   Method used:   Print then Give to Patient   [id number] FLUCONAZOLE 150 MG TABS (FLUCONAZOLE) 1 daily for yeast  #7 x 0   Entered and Authorized by:   Deneise Lever MD   Signed by:   Deneise Lever MD on 05/10/2010   Method used:   Print then Give to Patient   [id number] DOXYCYCLINE HYCLATE 100 MG CAPS (DOXYCYCLINE HYCLATE) 2 today then one daily  #8 x 1   Entered and Authorized by:   Deneise Lever MD   Signed by:   Deneise Lever MD on 05/10/2010   Method used:   Electronically to        [id number] (retail)       [address]       [place], [place]  [address]       Ph: [phone] or [phone]       Fax: [phone]   RxID:   [id number]      Medication Administration  Injection # 1:    Medication: Depo- Medrol 80mg     Diagnosis: ASTHMATIC BRONCHITIS, ACUTE (ICD-466.0)    Route: SQ    Site: RUOQ gluteus    Exp Date: 11/2012    Lot #: [id number]    Mfr: Pharmacia    Patient tolerated injection without complications    Given by: Clayborne Dana CMA (May 10, 2010 12:30 PM)  Medication # 1:    Medication: Xopenex 1.25mg     Diagnosis: ASTHMATIC BRONCHITIS, ACUTE  (ICD-466.0)    Dose: 1 vial    Route: inhaled    Exp Date: 03/2011    Lot #: [id number]    Mfr: Sepracor    Patient tolerated medication without complications    Given by: Clayborne Dana CMA (May 10, 2010 12:31 PM)  Orders Added: 1)  Est. Patient Level IV [id number] 2)  Prescription Created  Electronically [G8553] 3)  Admin of Therapeutic Inj  intramuscular or subcutaneous [[id number]] 4)  Depo- Medrol 80mg  [J1040] 5)  Nebulizer Tx [id number]

## 2010-09-07 NOTE — Progress Notes (Signed)
Summary: cough syrup  Phone Note Call from Patient   Caller: Patient Call For: young Summary of Call: need hydrocodone cough syrup refilled [place] Initial call taken by: Gustavus Bryant,  March 08, 2010 2:28 PM  Follow-up for Phone Call        Spoke with pt.  She states her cough is "the same"- no better or worse.  She would like a refill on her hydromet cough syrup.  Please advise if okay thanks! allergic to sulfa and codiene Follow-up by: Tilden Dome,  March 08, 2010 3:48 PM  Additional Follow-up for Phone Call Additional follow up Details #1::        OK to refill. Ask her to please try to stretch it out as far as possible each time. Additional Follow-up by: Deneise Lever MD,  March 08, 2010 5:16 PM    Additional Follow-up for Phone Call Additional follow up Details #2::    Rx was called to pharm.  Spoke with pt and made aware that this was done and advised per Dr Annamaria Boots, to try ro stretch this out each time she has ot filled.  Pt verbalized understanding. Follow-up by: Tilden Dome,  March 08, 2010 5:27 PM  Prescriptions: HYDROMET 5-1.5 MG/5ML  SYRP (HYDROCODONE-HOMATROPINE) take one teaspoonful every 6 hours as needed  #300 ML x 0   Entered by:   Tilden Dome   Authorized by:   Deneise Lever MD   Signed by:   Tilden Dome on 03/08/2010   Method used:   Telephoned to ...       [id number] (retail)       [address]       [place], [place]  [address]       Ph: [phone] or [phone]       Fax: [phone]   RxID:   [id number]

## 2010-09-22 ENCOUNTER — Telehealth: Payer: Self-pay | Admitting: Internal Medicine

## 2010-09-29 NOTE — Progress Notes (Signed)
Summary: yeast in mouth-requesting meds  Phone Note Call from Patient Call back at Home Phone [phone]   Caller: Patient Call For: Hilja Kintzel Summary of Call: pt c/o yeast in mouth (from advair). requests rx for diflucan. [facility] [place] [facility] [address].  Initial call taken by: Cooper Render, CNA,  September 22, 2010 9:04 AM  Follow-up for Phone Call        Pt c/o mouth feeling raw, burning, sores with white patches on insides and roof of mouth, and tongue x 1 week. Requesting medication called to pharmacy. Please advise. Thanks. Iran Planas CMA  September 22, 2010 9:50 AM  Allergies (verified):  1)  ! Sulfa 2)  ! Codeine  Additional Follow-up for Phone Call Additional follow up Details #1::        Per CDY-okay to give Diflucan 150mg  #3 take 1 by mouth daily x 3 days no refills .Clayborne Dana CMA  September 22, 2010 10:31 AM   Rx sent to pharmacy. lmomtcb x 1. Iran Planas CMA  September 22, 2010 10:48 AM   Pt informed. Iran Planas CMA  September 22, 2010 11:40 AM     New/Updated Medications: FLUCONAZOLE 150 MG TABS (FLUCONAZOLE) Take 1 tablet by mouth once a day x 3 days Prescriptions: FLUCONAZOLE 150 MG TABS (FLUCONAZOLE) Take 1 tablet by mouth once a day x 3 days  #3 x 0   Entered by:   Iran Planas CMA   Authorized by:   Deneise Lever MD   Signed by:   Iran Planas CMA on 09/22/2010   Method used:   Electronically to        [id number] (retail)       [address]       [place], [place]  [address]       Ph: [phone] or [phone]       Fax: [phone]   RxID:   [id number]

## 2010-11-04 ENCOUNTER — Encounter: Payer: Self-pay | Admitting: Internal Medicine

## 2010-11-08 ENCOUNTER — Ambulatory Visit (INDEPENDENT_AMBULATORY_CARE_PROVIDER_SITE_OTHER): Payer: 59 | Admitting: Internal Medicine

## 2010-11-08 ENCOUNTER — Encounter: Payer: Self-pay | Admitting: Internal Medicine

## 2010-11-08 VITALS — BP 112/68 | HR 68 | Ht 62.0 in | Wt 156.4 lb

## 2010-11-08 DIAGNOSIS — R05 Cough: Secondary | ICD-10-CM

## 2010-11-08 DIAGNOSIS — R059 Cough, unspecified: Secondary | ICD-10-CM

## 2010-11-08 DIAGNOSIS — G4733 Obstructive sleep apnea (adult) (pediatric): Secondary | ICD-10-CM

## 2010-11-08 DIAGNOSIS — J209 Acute bronchitis, unspecified: Secondary | ICD-10-CM

## 2010-11-08 MED ORDER — HYDROCODONE-HOMATROPINE 5-1.5 MG/5ML PO SYRP: 5.0000 mL | ORAL_SOLUTION | Freq: Four times a day (QID) | ORAL | Status: AC | PRN

## 2010-11-08 MED ORDER — FLUTICASONE-SALMETEROL 250-50 MCG/DOSE IN AEPB: 1.0000 | INHALATION_SPRAY | Freq: Two times a day (BID) | RESPIRATORY_TRACT | Status: DC

## 2010-11-08 MED ORDER — DOXYCYCLINE HYCLATE 100 MG PO TABS: ORAL_TABLET | ORAL | Status: AC

## 2010-11-08 NOTE — Progress Notes (Signed)
  Subjective:    Patient ID: [name], female    DOB: Oct 24, 1955, 55 y.o.   MRN: [id number]  HPI 55 yo F former smoker, followed here for obstructive sleep apnea and for hx bronchitis.  She was recently treated by her PCP for acute bronchitis. Onset after mowing grass. Still coughing up thick greenish yellow mucus after augmentin they gave her. She thinks she does better with doxycycline. Denies fever, swollen glands, sore throat.  Last seen here May 10, 2010. Hx of allergy testing but never on vaccine. Pollen is bothering with nasal congestion and wheezey cough more this year.  Continues CPAP at 13 cwp all night every night.  Sleeps ok. She asks about alternatives and we discussed palatal surgeries, oral appliances and Provent nasal valves. Fraternal Twin sister had MI.  Review of Systems See HPI Constitutional:   No weight loss, night sweats,  Fevers, chills, fatigue, lassitude. HEENT:   No headaches,  Difficulty swallowing,  Tooth/dental problems,  Sore throat,                No sneezing, itching, ear ache, nasal congestion, post nasal drip,   CV:  No chest pain,  Orthopnea, PND, swelling in lower extremities, anasarca, dizziness, palpitations  GI  No heartburn, indigestion, abdominal pain, nausea, vomiting, diarrhea, change in bowel habits, loss of appetite  Resp: No shortness of breath with exertion or at rest.  No excess mucus, no productive cough,  No non-productive cough,  No coughing up of blood.  No change in color of mucus.  No wheezing.  No chest wall deformity  Skin: no rash or lesions.  GU: no dysuria, change in color of urine, no urgency or frequency.  No flank pain.  MS:  No joint pain or swelling.  No decreased range of motion.  No back pain.  Psych:  No change in mood or affect. No depression or anxiety.  No memory loss.     Objective:   Physical Exam General- Alert, Oriented, Affect-appropriate, Distress- none acute, WDWN  Skin- rash-none, lesions-  none, excoriation- none  Lymphadenopathy- none  Head- atraumatic  Eyes- Gross vision intact, PERRLA, conjunctivae clear, secretions  Ears- Normal-  Hearing, canals, Tm L ,   R ,  Nose- Clear, No- Septal dev, mucus, polyps, erosion, perforation   Throat- Mallampati II , mucosa clear , drainage- none, tonsils- atrophic  Neck- flexible , trachea midline, no stridor , thyroid nl, carotid no bruit  Chest - symmetrical excursion , unlabored     Heart/CV- RRR , no murmur , no gallop  , no rub, nl s1 s2                     - JVD- none , edema- none, stasis changes- none, varices- none     Lung- wheeze heard best over left scapula, cough- none , dullness-none, rub- none     Chest wall-   Abd- tender-no, distended-no, bowel sounds-present, HSM- no  Br/ Gen/ Rectal- Not done, not indicated  Extrem- cyanosis- none, clubbing, none, atrophy- none, strength- nl  Neuro- grossly intact to observation          Assessment & Plan:

## 2010-11-08 NOTE — Patient Instructions (Addendum)
Try increasing Advair to the 250/50 strength, 1 puff and rinse, twice daily  Sample and script  Script for doxycycline  Antihistamine as needed for runny nose and sneeze.

## 2010-11-08 NOTE — Assessment & Plan Note (Signed)
This seems more prersistent or frequently recurrent through this winter. A seasonal pattern is not well defined  All though she does blame pollen currently. We will try increasint her Advair ot 250 to stabilixze.

## 2010-11-08 NOTE — Assessment & Plan Note (Signed)
Good compliance and  Control on CPAP 13

## 2010-11-10 NOTE — Assessment & Plan Note (Signed)
Chronic complaint often contolled with narcotic cough syrup. Common etiologies- allergic rhinitis/ drip, reflux, reactive airway/ bronchitis/ asthma have all been explored.

## 2010-11-26 ENCOUNTER — Encounter: Payer: Self-pay | Admitting: Internal Medicine

## 2010-11-29 ENCOUNTER — Encounter: Payer: Self-pay | Admitting: Internal Medicine

## 2010-12-17 ENCOUNTER — Other Ambulatory Visit: Payer: Self-pay | Admitting: Neurological Surgery

## 2010-12-17 DIAGNOSIS — M542 Cervicalgia: Secondary | ICD-10-CM

## 2010-12-21 NOTE — Cardiovascular Report (Signed)
[name], [name]             ACCOUNT NO.:  [id number]   MEDICAL RECORD NO.:  [id number]          PATIENT TYPE:  OIB   LOCATION:  2899                         FACILITY:  [facility]   PHYSICIAN:  Shelva Majestic, M.D.     DATE OF BIRTH:  18-Dec-1955   DATE OF PROCEDURE:  02/13/2009  DATE OF DISCHARGE:  02/13/2009                            CARDIAC CATHETERIZATION   REFERRING PHYSICIAN:  Annie Main A. [place], MD   INDICATIONS:  [name] is a very pleasant 55 year old African  American female, who has a history of known coronary artery disease.  She is status post stenting of her proximal right coronary artery in  2007 with insertion of a 3.0 x 13 mm Cypher stent.  At that time, she  had a concomitant coronary artery disease of 60-70% with mild  calcification in her LAD and 20% circumflex stenosis.  Essentially,  these other lesions were unchanged from 2003.  Apparently yesterday, she  was seen as an add-on by Dr. Felton Clinton per referral of Dr. Forde Dandy for  recurrent chest pain.  Dr. Felton Clinton felt definitive cardiac  catheterization should be performed, and she is now scheduled for this  procedure today.  She does have a history of a well-controlled  hypertension, type 2 diabetes mellitus, and dyslipidemia on treatment.   PROCEDURE:  After premedication with Versed 2 mg plus fentanyl 25 mcg,  the patient was prepped and draped in usual fashion.  Her right femoral  artery was punctured anteriorly and a 5-French sheath was inserted  without difficulty.  Diagnostic cardiac catheterization was done  utilizing 5-French Judkins 4 left and right coronary catheters.  A 5-  French pigtail catheter was used for biplane cine left ventriculography.  The patient tolerated the procedure well.  Prior to completing the  procedure, her old films were brought up in the catheterization  laboratory and the present films were compared side by side with the old  films, which did not show any difference in  the previous concomitant CAD  involving the LAD and circumflex.  She tolerated the procedure well.  Hemostasis was obtained by direct manual pressure.   HEMODYNAMIC DATA:  Central aortic pressure was 150/64.  Left ventricular  pressure was 150/21.   ANGIOGRAPHIC DATA:  Left main coronary artery was angiographically  normal and bifurcated into an LAD and left circumflex system.   The LAD gave rise to a very proximal small first diagonal vessel.  After  the proximal septal perforating artery, there was previously noted mild  calcification with 60 to at most 60-70% narrowing, somewhat  eccentrically.  There was brisk TIMI 3 flow.  The vessel supplied a  large diagonal vessel beyond this and several small septal perforating  arteries.  The LAD wrapped around the apex.  When compared to the 2003  and 2007 studies, this lesion was identical in appearance and  angiographically, there was not any evidence for progression.   The circumflex vessel is a moderate-sized vessel that gave rise to 2  marginal vessels.  There was mild 20% narrowing in the AV groove  circumflex after the OM1 takeoff.  The right coronary artery was moderate-sized vessel that had a widely  patent stent Cypher stent in its proximal segment without renarrowing.  After a small anterior RV marginal branch, there was mild 20% narrowing.   Biplane cine left ventriculography revealed normal LV contractility  without focal segmental wall motion abnormalities.   IMPRESSION:  1. Normal left ventricular function.  2. Widely patent stent in the proximal right coronary artery with mild      20% mid right coronary artery narrowing.  3. No change in the previously noted 60 to less than 70% mildly      calcified and mildly eccentric stenosis in the left anterior      descending after the first septal perforating artery and evidence      for 20% narrowing in the atrioventricular groove circumflex.   RECOMMENDATION:  Medical  therapy.           ______________________________  Shelva Majestic, M.D.     TK/MEDQ  D:  02/13/2009  T:  02/13/2009  Job:  [id number]   cc:   Annie Main A. Forde Dandy, M.D.  Satira Anis. Amedeo Plenty, M.D.  Quincy Carnes, MD

## 2010-12-21 NOTE — H&P (Signed)
[name], [name]             ACCOUNT NO.:  [id number]   MEDICAL RECORD NO.:  [id number]          PATIENT TYPE:  INP   LOCATION:  6529                         FACILITY:  [facility]   PHYSICIAN:  Berneta Sages, M.D.        DATE OF BIRTH:  1956/05/26   DATE OF ADMISSION:  05/31/2008  DATE OF DISCHARGE:                              HISTORY & PHYSICAL   CURRENT LOCATION:  [facility] [phone], to be moved to step-down unit  this evening.   CHIEF COMPLAINT:  Fever, cough, and shortness of breath.   HISTORY OF PRESENT ILLNESS:  This is a 55 year old African American  female who has multiple comorbidities as dictated below, but include  coronary artery disease, type 2 diabetes mellitus, ongoing tobacco abuse  with asthmatic bronchitis, history of cervical spine fusion, and  obstructive sleep apnea who complains of cough x1 day with increasing  symptoms consisting of body aches, fever, pleuritic discomfort, and  sputum that is somewhat purulent.  The patient's primary care [name],  Dr. Reynold [name] started the patient on Avelox as well as an albuterol  metered-dose inhaler on May 30, 2008, but despite these  interventions, the patient's symptoms progressed with high fever this  morning, and the patient subsequently presented to [facility]  Emergency Room secondary to her increased work of breathing and fever.  At the emergency room, nebulizers for prolonged duration were  administered along with Tylenol and benzodiazepine use.  Despite these  interventions, the patient continued to have increased work of breathing  with shortness of breath and high fevers and the patient was  subsequently transferred to [facility] for further medical  management.  En route the patient's work of breathing worsened requiring  additional nebulizer treatments and 100% face mask using a non-  rebreather.  The patient is now present on telemetry floor given her  improved clinical  disposition prior to leaving [facility]  [facility] Room.   REVIEW OF SYSTEMS:  Positive for compliance with her Advair and recent  worsening of her CBG monitoring, positive for fevers, chills, and mild  rhinitis.  No headaches, no chest pain, no palpitations.  No nausea,  vomiting, diarrhea, constipation, blood per rectum or in urine.  Negative for focal neurological deficits.  Positive for continuation of  a half-pack per day tobacco abuse history.   PROBLEM LIST:  1. Coronary artery disease, status post percutaneous transluminal      coronary angioplasty in October 2007 and history of asthmatic      bronchitis.  2. Hypertension.  3. Type 2 diabetes mellitus.  4. Gastroesophageal reflux disease.  5. Hypothyroidism.  6. Cervical spinal fusion in August 2008 by Dr. Ronnald [name] secondary to      herniated disk and cervical spine instability.  7. Hyperlipidemia.  8. Obstructive sleep apnea with a history of CPAP usage.  9. Multifactorial chronic cough, followed by Dr. Baird [name].   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  Drinks socially.  Smokes half a pack per  day  by her history.   FAMILY HISTORY:  Significant for diabetes and early coronary artery  disease.   CURRENT MEDICATIONS:  1. Advair, dose unknown.  2. Imdur 120 mg p.o. daily.  3. Metformin 1000 mg p.o. b.i.d.  4. Plavix 75 mg p.o. daily.  5. Synthroid 200 mcg p.o. daily.  6. Maxzide 37.5/25 one p.o. daily.  7. Verapamil Extended Release 240 mg p.o. daily.  8. Vytorin 10/40 one p.o. daily.  9. Toprol-XL, dose unknown, half a tablet each day.  10.Aspirin 81 mg each day.  11.Lantus insulin 30 units each day with sliding scale of unknown      quantities.   ALLERGIES:  The patient has allergies to CODEINE and SULFA.   CURRENT LABS:  Sodium 136, potassium 3.6, BUN 12, creatinine 0.9, serum  CO2 of 25, glucose 143, calcium 8.6.  White blood cell  count 5.9,  hemoglobin 10.2, relatively stable from labs 2 years ago, hematocrit  31.6%, platelet count 227.  Chest x-ray reveals increased perihilar  markings consistent with viral pneumonitis.   PHYSICAL EXAMINATION:  GENERAL:  The patient is alert, appropriate, but  very short sentences with increased work of breathing and mild  intercostal retractions.  VITAL SIGNS:  Temperature max with 101.6 degrees Fahrenheit, current  temperature 100.1 degrees Fahrenheit, blood pressure 122/54, pulse 114  and regular, respirations 32-34 on 100% rebreather, oxygen saturation  98% on this 100% rebreather.  HEENT:  Sclerae anicteric.  Extraocular movements are intact.  NECK:  Supple.  There is no cervical lymphadenopathy.  There is moderate  expiratory wheezing bilaterally with sensory muscle usage and mild  retractions.  CARDIOVASCULAR:  Regular rate with tachycardiac rhythm.  ABDOMEN:  Soft, nontender, and nondistended abdomen.  Bowel sounds  present.  EXTREMITIES:  No edema.  Pedal pulses are intact.  There is no active  synovitis.  NEUROLOGIC:  Grossly nonfocal with the patient having short responses to  all sentences asked of her, but is able to follow commands and is  appropriate.   ASSESSMENT/PLAN:  1. Presumed viral pneumonitis plus/minus evolving pneumonia and the      patient with multiple comorbidities including coronary artery      disease, continued tobacco abuse, diabetes, obstructive sleep      apnea, and history of asthmatic bronchitis who presents with      increased work of breathing, bronchospasm, increased respiratory      rate, and worsening hypoxia that has progressed since transfer from      [facility] Emergency Room to this floor.  We will      transfer the patient to the step-down unit for close monitoring.      We will provide intravenous steroids, nebulizers, and oxygen as      needed.  We will monitor closely and entertain the use of BiPAP and      or  intubation if necessary, but hopefully this can be deferred      especially given her cervical spine issues.  Critical care medicine      has agreed to see the patient.  2. Infectious disease.  We will change the Avelox to azithromycin and      Rocephin and we will start Tamiflu given presumed viral pneumonitis      and the high potential of H1N1 given this patient's multiple      comorbidities.  3. Type 2 diabetes mellitus.  We will continue Lantus with sliding      scale insulin and  hold metformin given the current clinical      situation.  4. Coronary artery disease.  We will continue nitrates, calcium      channel blockade, and Plavix therapy.  We will watch for volume      overload with steroid use and given the fact that we are holding      diuretics for the time being, we will also change her Toprol-XL to      Bystolic for increased beta 1 selectivity and defer any potential      issues      with bronchospasm.  5. Hypothyroidism.  We will continue replacement.  6. We will provide deep vein thrombosis and gastroesophageal reflux      disease prophylaxis.      Berneta Sages, M.D.  Electronically Signed     RA/MEDQ  D:  05/31/2008  T:  06/01/2008  Job:  [id number]   cc:   Shelva Majestic, M.D.  Clinton D. Annamaria Boots, MD, FCCP, FACP

## 2010-12-21 NOTE — Op Note (Signed)
[name], [name]             ACCOUNT NO.:  [id number]   MEDICAL RECORD NO.:  [id number]          PATIENT TYPE:  OIB   LOCATION:  [id number]                         FACILITY:  [facility]   PHYSICIAN:  Eustace Moore, MD     DATE OF BIRTH:  12-27-1955   DATE OF PROCEDURE:  03/26/2008  DATE OF DISCHARGE:                               OPERATIVE REPORT   PREOPERATIVE DIAGNOSES:  1. Cervical spondylosis with cervical disk herniation, C4-C5, C5-C6,      and C6-C7.  2. Segmental instability, C4-C5 with anterior listhesis of C4 on C5.  3. Neck and right arm pain.   POSTOPERATIVE DIAGNOSES:  1. Cervical spondylosis with cervical disk herniation, C4-C5, C5-C6,      and C6-C7.  2. Segmental instability, C4-C5 with anterior listhesis of C4 on C5.  3. Neck and right arm pain.   PROCEDURES:  1. Decompressive anterior cervical diskectomy C4-C5, C5-C6, and C6-7      for central canal and nerve root decompression.  2. Anterior cervical arthrodesis C4-C5, C5-C6, and C6-C7 utilizing a 6-      mm corticocancellous allograft at C4-C5, C5-C6, and a 7-mm graft at      C6-C7.  3. Anterior cervical plating, C4-C7 utilizing Atlantis Vision plate.   SURGEON:  Eustace Moore, MD   ASSISTANT:  Kary Kos, MD   ANESTHESIA:  General endotracheal.   COMPLICATIONS:  None apparent.   INDICATIONS FOR PROCEDURE:  [name] is a 55 year old female who was  referred with neck and right arm pain.  She had seen a previous surgeon  who recommended a 3-level ACDF with plating at C4-C5, C5-C6 and C6-C7.  She had an MRI which showed spondylosis at C4-C5 and C5-C6 with midline  disk herniation at C4-C5, right foraminal stenosis at C5-C6 with a broad-  based osteophytic ridge, and a large paracentral disk protrusion to the  right at C6-C7.  I recommended a 3-level ACDF with plating at C4-C5, C5-  C6 and C6-C7.  She understood the risks, benefits, expected outcome, and  wished to proceed.   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  This  was causing significant stenosis and knuckling of the dura into the  cord.  We spent considerable time trying to peel this away from the  surface of the dura.  We never saw CSF leak but at several points left  some of this behind in order to prevent complications given her lack of  preoperative myelopathy and lack of critical stenosis.  We did not feel  that 100% removal of this was absolutely necessary.  We simply wanted to  address her radicular pain on the right with foraminotomies at each  level.  We also felt that significant removal of this with decompression  of central canal followed by fusion would prevent progression of this  and this was necessarily a stenosis case.  We also considered a  corpectomy of C6 but decided against  that as the dura continued to relax  during the surgery signifying adequate decompression of the central  canal at each level.  Therefore, we used our judgment and decided  against corpectomy at C6.   DESCRIPTION OF PROCEDURE:  The patient was taken to the operating room.  After induction of adequate generalized endotracheal anesthesia, she was  placed in supine position on the operating room table.  Her right  anterior cervical region was prepped with DuraPrep and then draped in  usual sterile fashion.  A 5 mL of local anesthesia was injected and  transverse incision was made to the right of midline and carried down to  the platysma.  The platysma was opened and then undermined with  Metzenbaum scissors.  I then dissected a plane medial to the  sternocleidomastoid muscle, internal carotid artery, and lateral to the  trachea and esophagus to expose C4-C5, C5-C6 and C6-C7.  Intraoperative  fluoroscopy confirmed my level at each level.  We took down the longus  colli  muscles and placed the Shadow-Line retractors under this.  We  removed the anterior osteophytes with a Leksell rongeur and then used  the 15 blade scalpel to incise the annulus at each level.  Each disk was  very collapsed and degenerated.  We did initial diskectomy with curved  curettes and pituitary rongeurs.  We then used the high-speed drill to  drill the endplates to prepare for later arthrodesis.  We drilled at C4-  C5, and C5-C6 in a rectangular fashion from foramen to foramen to a  height of 6 mm drilling down to the level over the posterior  longitudinal ligament and posterior osteophytes.  At C6-C7, we knew we  had to widen this even further drilling up under the body of C6 and up  under the body of C7 as the disk herniation preoperatively appeared to  lift the posterior longitudinal ligament both cephalad and caudad.  We  drilled down to the level of the posterior longitudinal ligament once  again.  We then started at C6-C7.  We opened the posterior longitudinal  ligament laterally with a nerve hook and then started removing this with  undercutting the body of C5 and C6.  However, once we got paracentral to  the right, there was a large partially calcified disk herniation and  possible ossification of the posterior longitudinal ligament which was  knuckling into the dura, causing significant cord compression, and  spinal stenosis at this level.  We pulled gentle traction on this with  pituitary rongeur and swept up under this with a micro nerve hook to try  to detach this from its inflammatory attachment to the underlying dura.  No CSF was seen at any time.  We continued to peel this away and bite it  away very carefully trying to make sure we got her dura nicely  decompressed.  Once we were able to undercut the body of C6 and C7 and  removed the majority of this calcified disk herniation and/or  ossification of the posterior longitudinal ligament, the dura was nicely  relaxed,  full and capacious all the way across.  We palpated with a  nerve hook in a circumferential fashion and felt we had an adequate  decompression of the central canal.  We considered a C6 corpectomy, but  decided against this as we felt that our decompression was adequate by  visualization and palpation.  We then turned our attention to C5-C6 and  performed the exact same decompression,  opened the posterior  longitudinal ligament, removing while undercutting the bodies of C5 and  C6.  The posterior longitudinal ligament did not appear ossified here  and there was no calcification or disk herniation.  There was simple  osteophytic material to the right causing foraminal stenosis.  We  marched along the C6 superior endplate to identify the pedicles  bilaterally, marched along the pedicles, decompressed the C6 nerve  roots, and then we undercut the bodies of C5 and C6.  We then palpated  with a nerve hook into the foramen and into the central canal to assure  adequate decompression both visually and with palpation.  We then turned  our attention to C4-C5 and performed the exact same decompression  opening the posterior longitudinal ligament with the nerve hook and then  undercutting the bodies of C4 and C5 and performing bilateral  foraminotomies by marching along the pedicle, identifying the nerve  roots, and decompressing them into the foramen.  We then palpated with a  nerve hook once again in a circumferential fashion to assure adequate  decompression.  We then measured interspace to be 6 mm at C4-C5, C5-C6  and 7 mm at C6-C7.  We used corresponding corticocancellous allograft  and tapped these into position at each level.  We then tried to use a  translational plate, but could not find one short enough in stature to  use and therefore we used the Atlantis Vision plate.  It was about 2 mm  longer than we normally would have used, but it was the shortest 3-level  plate that we could find in  the set.  Therefore, we used this plate and  placed two 13-mm variable angled screws in the bodies of C4 and C7, and  two 12-mm variable angled screws in the bodies of C5 and C6 and we  locked in position with a locking mechanism on the plate.  We then  irrigated with saline solution containing bacitracin, dried up all  bleeding points with bipolar cautery and with Surgifoam.  I spent  considerable time drying the surgical bed and then placed a #7 flat JP  drain through a separate stab incision.  Inspected the wound once again  to assure adequate hemostasis, then closed the platysma with 3-0 Vicryl,  closed the subcuticular tissue with 3-0  Vicryl, and closed the skin with Benzoin and Steri-Strips.  The drapes  were removed.  A sterile dressing was applied.  The patient was awakened  from general anesthesia and transferred to recovery room in stable  condition.  At the end of the procedure, all sponge, needle, and  instrument counts were correct.      Eustace Moore, MD  Electronically Signed     DSJ/MEDQ  D:  03/26/2008  T:  03/26/2008  Job:  [id number]

## 2010-12-21 NOTE — Assessment & Plan Note (Signed)
[name]                             PULMONARY OFFICE NOTE   [name], [name]                    MRN:          [id number]  DATE:07/04/2007                            DOB:          [birth year]    PROBLEM:  1. Obstructive sleep apnea/CPAP.  2. Coronary disease/stents.  3. Allergic rhinitis.  4. Asthmatic bronchitis.  5. Diabetes.  6. Multifactorial chronic cough.  7. GERD.   HISTORY:  She saw Dr. Lenna Gilford October 1 with sinusitis, resolved with  Augmentin.  Continues CPAP at 13 CWP and sleeps well.  Now, in the past  week or two, has had productive cough, brown to green sputum,  occasionally it burns.  I talked again with her about reflux symptoms  and it does sound as though she is feeling that occasionally.  We felt  reflux was the likely cause of at least part of her chronic cough and I  have discussed reflux precautions again.  She continues Nexium.   Medications were reviewed.  She has had flu shot.   OBJECTIVE:  Weight 170 pounds, BP 116/72, pulse 79, room air saturation  100%.  Hoarse.  Throat is not red.  There is no stridor or neck vein  distention, no thyromegaly or adenopathy.  Mild dry cough on forced expiration, a few scattered crackles, no  definite rhonchi or wheeze.  Heart sounds are regular, without murmur.  No edema.   IMPRESSION:  1. Allergic rhinitis.  2. Bronchitis with exacerbation.  3. Obstructive sleep apnea.  4. GERD.  5. Chronic cough, including cyclical cough.   PLAN:  1. Reflux precautions and management were emphasized.  2. Doxycycline 100 mg twice today, then daily for a total of seven      days.  3. Nebulizer treatments.  4. Xopenex 1.25 mg.  5. Depo-Medrol 80 mg IM with steroid talk.  6. Refill cough syrup, Hydromet 300 mL 5 mL q. 6h p.r.n. cough, refill      once.  7. Schedule return six months, earlier p.r.n.     Clinton D. Annamaria Boots, MD, [place], [place]  Electronically Signed    CDY/MedQ  DD:  07/04/2007  DT: 07/04/2007  Job #: [id number]   cc:   Annie Main A. Forde Dandy, M.D.  Shelva Majestic, M.D.

## 2010-12-21 NOTE — Discharge Summary (Signed)
[name], [name]             ACCOUNT NO.:  [id number]   MEDICAL RECORD NO.:  [id number]          PATIENT TYPE:  INP   LOCATION:  5005                         FACILITY:  [facility]   PHYSICIAN:  Berneta Sages, M.D.        DATE OF BIRTH:  1955/10/03   DATE OF ADMISSION:  05/31/2008  DATE OF DISCHARGE:  06/05/2008                               DISCHARGE SUMMARY   LOCATION:  [facility]   DISCHARGE DIAGNOSES:  1. Presumed viral versus bacterial bronchitis associated with      respiratory distress, bronchospasm, and hypoxia, now resolved with      residual dyspnea on exertion and complicated by tobacco abuse.  2. Tobacco abuse with the patient having it packed with her sister to      discontinue any further tobacco upon discharge and indeed without      any evidence of tobacco withdrawal during this hospitalization.  3. Hypertension, controlled albeit slightly high at times, complicated      by prednisone use.  4. Type 2 diabetes mellitus, historically uncontrolled, but improved      with higher doses of Lantus insulin and sliding scale.  5. Coronary artery disease, now hemodynamically stable with no      evidence of unstable angina.  6. Hypokalemia, to be repleted prior to discharge.  7. Anemia consistent with iron deficiency, to be initiated on iron      supplementation and further workup on an outpatient basis   SECONDARY DIAGNOSES:  1. Gastroesophageal reflux disease.  2. Hypothyroidism.  3. History of cervical spine fusion in August 2008 by Dr. Ronnald Ramp      secondary to herniated disk and cervical spine instability.  4. Hyperlipidemia.  5. Obstructive sleep apnea with history of CPAP usage.  6. Multifactorial chronic cough followed by Dr. Baird Lyons.   DISCHARGE MEDICATIONS:  1. Advair inhaler 1 puff twice a day.  2. Isosorbide mononitrate 120 mg daily.  3. Metformin 1000 mg 2 times daily.  4. Plavix 75 mg daily.  5. Synthroid 200 mcg daily.  6.  Triamterene/hydrochlorothiazide 37.5/25 one p.o. daily.  7. Verapamil extended release 240 mg daily.  8. Vytorin 10/40 one p.o. daily.  9. Aspirin 81 mg daily.  10.Toprol-XL 25 mg daily.  11.Lantus insulin 30 units each day.  12.Sliding scale with NovoLog per home regimen.   NEW MEDICATIONS:  1. Levaquin 750 mg each day for 5 days.  2. Mucinex 1 tablet twice daily.  3. Nu-Iron 150 mg each day for anemia.  4. Prednisone 20 mg p.o. q.a.m. for 2 days and then decrease to 10 mg      q.a.m. for 2 days and then to be discontinued.   DISCHARGE LABORATORY DATA:  White blood cell count 8.7, hemoglobin 10.3,  hematocrit 31.4%, and platelet count 265.  Sodium 137, potassium 3.2  prior to his supplementation, BUN 9, creatinine 0.86, glucose 87, serum  CO2 26, ferritin low at 19, serum iron low at 19, total iron-binding  capacity 310, and oxygen saturation low at 6%.  Chest x-ray on June 02, 2008, reveals severe perihilar  thickening consistent with bronchitis  presumably viral versus pneumonia versus asthma with improved aeration  compared to admission chest x-ray.   FOLLOWUP:  The patient will see Dr. Annamaria Boots next Monday for followup and  will see Dr. Forde Dandy in approximately 2-4 weeks for followup of her blood  pressure, type 2 diabetes mellitus, and iron deficiency anemia.   HISTORY OF PRESENT ILLNESS:  Briefly, this is 55 year old African  American female with multiple medical comorbidities as above and also  including coronary artery disease, status post stent placement in  October 2007, who presents with significant respiratory distress  complicated by bronchospasm, hypoxia, and high fever despite prolonged  administration of nebulizers at the [facility] Emergency  Room.  Despite interventions at that outside ER and increased work of  breathing, she was transferred to a telemetry bed at [facility]  where her work of breathing worsened significantly en route with the   need for 100% face mask using a non-rebreather.  She was subsequently  transferred to a step-down unit and given aggressive pulmonary toilet,  IV steroids, IV antibiotics as well as Tamiflu for presumed H1N1 and  monitored closely.   HOSPITAL COURSE:  The patient was seen by both myself as well as  Pulmonary Critical Care Medicine, who helped in her management and also  increased her gastroesophageal reflux disease regimen and followed  closely for any worsening of her clinical picture and need for invasive  management.  Over the next 24 hours, she improved significantly with  chest x-ray never revealing an actual infiltrate, but consistent with  bronchitis.  Her blood pressure did elevate as well as blood sugar  secondary to steroid use, but this did improve significantly with  adjustment of her medication regimen.  Of note, her beta-blocker was  changed over to [facility] given its presumed improved beta-1 selectivity.  This may need to be reinitiated on an outpatient basis.  She was also  continued on DVT and gastroesophageal reflux disease prophylaxis  throughout her hospitalization.  She slowly, but surely improved with  decreased oxygen usage, decreased dyspnea on exertion, and decreased  bronchospasm given aggressive pulmonary toilet and prescribed treatment.  She was deemed appropriate for discharge on room air on June 05, 2008, on the steroid taper, continuation of empiric outpatient oral  antibiotics consisting of Levaquin.  Of note, she was on Rocephin and  azithromycin as well as Tamiflu during her hospitalization.  She is  ambulating in the room without difficulty and will be discharged with  the assistance of her sister.      Berneta Sages, M.D.  Electronically Signed     RA/MEDQ  D:  06/05/2008  T:  06/05/2008  Job:  [id number]   cc:   Annie Main A. Forde Dandy, M.D.  Clinton D. Annamaria Boots, MD, FCCP, FACP

## 2010-12-24 ENCOUNTER — Telehealth: Payer: Self-pay | Admitting: Internal Medicine

## 2010-12-24 MED ORDER — DOXYCYCLINE HYCLATE 100 MG PO TABS: ORAL_TABLET | ORAL | Status: DC

## 2010-12-24 MED ORDER — FLUCONAZOLE 150 MG PO TABS: 150.0000 mg | ORAL_TABLET | Freq: Once | ORAL | Status: AC

## 2010-12-24 MED ORDER — HYDROCODONE-HOMATROPINE 5-1.5 MG/5ML PO SYRP: 5.0000 mL | ORAL_SOLUTION | Freq: Four times a day (QID) | ORAL | Status: AC | PRN

## 2010-12-24 NOTE — Assessment & Plan Note (Signed)
[name]                             PULMONARY OFFICE NOTE   CARAN, MILLSON                    MRN:          [id number]  DATE:12/07/2006                            DOB:          1956/04/10    PROBLEM LIST:  1. Obstructive sleep apnea/CPAP.  2. Coronary disease/stents.  3. Allergic rhinitis.  4. Asthmatic bronchitis.  5. Diabetes.  6. Multifactorial chronic cough.   HISTORY OF PRESENT ILLNESS:  She finds that she needs to stay on Advair  every day, or otherwise she will start to wheeze and cough but with it,  she feels very comfortable. The CPAP remains comfortable, used every  night at 13. She feels her allergic rhinitis is well under control now  at peak spring season. She has had positive allergy skin tests  previously. We have again, reviewed environmental precautions, avoiding  pollen, dust, and mold as able.   MEDICATIONS:  Synthroid, Verapamil, Voltaren, Advair 100/50, Toprol XR,  Triam/HCT, Glucophage, Avapro 150, CPAP 13, Plavix, Vytorin, Nexium 40  mg, p.r.n. use of Flonase, albuterol inhaler, and Hydromet cough syrup.   ALLERGIES:  SULFA, CODEINE.   OBJECTIVE:  VITAL SIGNS:  Weight 173 pounds, blood pressure 126/72,  pulse regular at 82, room air saturation 100%.  GENERAL:  She is alert with no pressure marks on her face from the mask.  HEENT:  There is mucous bridging in the nose without much edema and no  visible polyps. Pharynx is clear.  LUNGS:  Clear to auscultation and percussion.  HEART:  Sounds regular without murmur.   IMPRESSION:  Adequate control of allergic rhinitis, allergic  asthma/bronchitis, and sleep apnea.   PLAN:  Schedule return in 6 months, earlier p.r.n.     Clinton D. Annamaria Boots, MD, [facility] Flood, [place]  Electronically Signed    CDY/MedQ  DD: 12/09/2006  DT: 12/09/2006  Job #: [id number]   cc:   Ishmael Holter. Forde Dandy, M.D.  Shelva Majestic, M.D.

## 2010-12-24 NOTE — Telephone Encounter (Signed)
Spoke with pt. She is c/o sinus pressure/drainage, green nasal d/c and prod cough with minimal green sputum. States onset was 4 days ago, taking saline ns and mucinex w/o relief. Requests rxs for diflucan, doxy and cough med. Pls advise thanks. Pt states if call back and NA okay to [facility]. Allergies  Allergen Reactions  . Codeine     REACTION: hallucinations/"loopy"  . Sulfonamide Derivatives     REACTION: hives

## 2010-12-24 NOTE — Discharge Summary (Signed)
[name], CADIENTE             ACCOUNT NO.:  [id number]   MEDICAL RECORD NO.:  [id number]          PATIENT TYPE:  INP   LOCATION:  6527                         FACILITY:  [facility]   PHYSICIAN:  Shelva Majestic, M.D.     DATE OF BIRTH:  02-18-1956   DATE OF ADMISSION:  05/18/2006  DATE OF DISCHARGE:  05/19/2006                                 DISCHARGE SUMMARY   DISCHARGE DIAGNOSES:  1. Coronary artery disease status post intervention during this admission.  2. Hypertension.  3. Hyperlipidemia.  4. Diabetes mellitus.  5. Hypothyroidism.   HOSPITAL COURSE:  Ms. Goodenough is a 55 year old African American female  patient of Dr. Claiborne Billings presented to the office with complaints of chest pain.  We adjusted her medications, increased her dose of Imdur to 90 mg daily.  A  recent nuclear study was unrevealing, but given her diabetes mellitus and  other risk factors, Dr. Claiborne Billings decided to proceed with catheterization to  delineate her anatomy.  The patient was admitted through short stay unit on  05/18/2006, for cardiac catheterization.   PROCEDURE:  Cardiac catheterization performed by Dr. Claiborne Billings on 05/18/2006,  and it revealed 80% stenosis of the RCA and 60-70% stenosis of the proximal  to mid LAD.  Dr. Claiborne Billings performed intervention with deployment of a Cypher  stent into the proximal RCA with reduction of the lesion from 80 to 0%.  The  patient tolerated the procedure well.   The next morning the patient was evaluated by Dr. Claiborne Billings and found to be in  stable condition for discharge home.  Her groin did not have any signs of  ecchymosis or hematoma, there was no oozing or bleeding from groin puncture  site.  She tolerated ambulation without difficulty.   LABORATORY DATA:  White blood cell count 8.6, hemoglobin 9.7, hematocrit  29.8, platelet count 150,000, sodium 139, potassium 3.5, chloride 106, CO2  of 25, BUN 5, creatinine 1.0, glucose 98.   DISCHARGE MEDICATIONS:  1. Aspirin 325 mg daily.  2. Plavix 75 mg daily.  3. Synthroid 175 mcg daily.  4. Toprol XL 50 mg.  5. __________  150 mg daily.  6. Verapamil 240 mg daily.  7. Imdur 90 mg daily.  8. __________  40 mg daily.  9. Triamterene/HCTZ 87.5/25 mg, half a pill daily.  10.Vytorin 10/20 mg daily.  11.Glucophage 1000 mg daily resumed on Sunday, 05/21/2006.   DISCHARGE DIET:  Low fat, low salt, low cholesterol diet.   DISCHARGE ACTIVITIES:  No driving, no lifting greater than 5 pounds for 3  days post cath.   FOLLOWUP:  Dr. Claiborne Billings will see patient on 06/22/2006, at 11:45.      York Grice, P.A.    ______________________________  Shelva Majestic, M.D.    MK/MEDQ  D:  05/19/2006  T:  05/19/2006  Job:  [id number]   cc:   Ray Church

## 2010-12-24 NOTE — Telephone Encounter (Signed)
Left detailed msg to let pt know prescriptions have been called to [facility] on [address] and she should call the pharmacy to see if prescriptions are ready. She was instructed to call if her symptoms do not improve or get worse.

## 2010-12-24 NOTE — Op Note (Signed)
   NAMEMINAHIL, [name]                       ACCOUNT NO.:  [id number]   MEDICAL RECORD NO.:  [id number]                   PATIENT TYPE:  AMB   LOCATION:  ENDO                                 FACILITY:  [facility]   PHYSICIAN:  Nelwyn Salisbury, M.D.               DATE OF BIRTH:  May 14, 1956   DATE OF PROCEDURE:  03/07/2003  DATE OF DISCHARGE:                                 OPERATIVE REPORT   PROCEDURE PERFORMED:  Esophagogastroduodenoscopy with biopsies.   ENDOSCOPIST:  Nelwyn Salisbury, M.D.   INSTRUMENT USED:  Olympus video panendoscope.   INDICATIONS FOR PROCEDURE:  A 55 year old [place] American female with iron-  deficiency anemia, rule out peptic ulcer disease, esophagitis, gastritis,  etc.   PREPROCEDURE PREPARATION:  Informed consent was procured from the patient.  The patient fasted for eight hours prior to the procedure.   PREPROCEDURE PHYSICAL:  Patient with stable vital signs.  Neck supple.  Chest clear to auscultation.  S1 and S2 regular.  Abdomen soft with normal  bowel sounds.   DESCRIPTION OF PROCEDURE:  The patient was placed in the left lateral  decubitus position and sedated with 100 mg of Demerol and 10 mg of Versed  intravenously.  Once the patient was adequately sedated and maintained on  low flow oxygen and continuous cardiac monitoring, the Olympus video  panendoscope was advanced through the mouthpiece, over the tongue, into the  esophagus under direct vision.  The entire esophagus appeared normal with no  evidence of ring, stricture, masses, esophagitis, or Barrett's mucosa.  The  scope was then advanced into the stomach.  Severe antral gastritis was  noted.  Biopsies were done to rule out Helicobacter pylori by pathology.  Retroflexion in the high cardia revealed no abnormalities.  The cardia and  the mid body of the stomach appeared healthy.  The duodenal bulb and the  proximal small bowel distal to the bulb up to 60 cm appeared normal.   IMPRESSION:   Severe antral gastritis; otherwise normal  esophagogastroduodenoscopy.  Biopsies sent fresh.   RECOMMENDATIONS:  1. Await pathology results.  2.     Proton pump inhibitor of choice.  3. Treat with antibodies if H. pylori present.  4. Proceed with the colonoscopy at this time.                                               Nelwyn Salisbury, M.D.    JNM/MEDQ  D:  03/07/2003  T:  03/08/2003  Job:  [id number]   cc:   Annie Main A. Forde Dandy, M.D.  [address]  [place]  [name] [address]  Fax: [phone]

## 2010-12-24 NOTE — Assessment & Plan Note (Signed)
[facility]                               PULMONARY OFFICE NOTE   LOUANN, HUEBERT                    MRN:          [id number]  DATE:06/09/2006                            DOB:          October 24, 1955    PROBLEMS:  1. Obstructive sleep apnea/CPAP.  2. Coronary disease/stents.  3. Allergic rhinitis.  4. Asthmatic bronchitis.  5. Diabetes.  6. Multifactorial chronic cough.   HISTORY:  Six-month followup.  She has previously demonstrated that a  component of her chronic cough was due to ACE inhibitors.  Since last year,  she has had a stent placed.  It did relieve chest pain and also increased  her sense of strength in her legs.  She is pleased with the result.  She  sneezes occasionally but cough has been better.  It comes and goes, affected  by the weather.  She watches for reflux symptoms. CPAP is used regularly at  13 CWP.  Dr. Forde Dandy treated a sinus infection earlier this year.  Rare need  for albuterol.  Chest x-ray at [facility] she understands was okay.  We will pull  that report.  Has had flu shot and has had a pneumococcal booster.  We  discussed her use of cough syrup which she continues to call for every few  months.   MEDICATIONS:  1. Synthroid.  2. Verapamil.  3. Voltaren.  4. Advair 100/50.  5. Toprol XL.  6. Triamterene/HCTZ.  7. Avandamet.  8. Glucophage.  9. Avapro 150.  10.CPAP 13.  11.Plavix.  12.Vytorin.  13.Nexium 40 mg.  14.Flonase.  15.Allegra D used occasionally.  16.Albuterol, used rarely.  17.Hydromet cough syrup.   DRUG INTOLERANCE:  SULFA AND CODEINE.   OBJECTIVE:  VITAL SIGNS:  Weight 176 pounds.  Blood pressure 120/68.  Pulse  regular, 83.  Room air saturation 100%.  NOSE AND THROAT:  Clear.  LUNGS:  Clear with no cough on deep inspiration or forced expiration.  No  wheeze.  HEART SOUNDS:  Regular without murmur or gallop.  NECK:  There is no neck vein distention.  EXTREMITIES:  No peripheral edema.   IMPRESSION:  Problem list as above.  Best management of multi-factorial  cough was again discussed.   PLAN:  1. We will pull chest x-ray report.  2. Schedule return in six months, earlier p.r.n.     Clinton D. Annamaria Boots, MD, [place] [facility], [place]  Electronically Signed    CDY/MedQ  DD: 06/10/2006  DT: 06/11/2006  Job #: [id number]   cc:   Annie Main A. Forde Dandy, M.D.  Shelva Majestic, M.D.

## 2010-12-24 NOTE — Telephone Encounter (Signed)
Per CY--doxycycline 100mg   #8   2 today then 1 daily until gone, diflucan 150mg    #7  1 daily, hydromet 277ml   1 tsp every 6 hours prn for cough.  No refill. thanks

## 2010-12-24 NOTE — Op Note (Signed)
NAMECLOVER, [name]                       ACCOUNT NO.:  [id number]   MEDICAL RECORD NO.:  [id number]                   PATIENT TYPE:  AMB   LOCATION:  ENDO                                 FACILITY:  [facility]   PHYSICIAN:  Nelwyn Salisbury, M.D.               DATE OF BIRTH:  March 07, 1956   DATE OF PROCEDURE:  03/07/2003  DATE OF DISCHARGE:                                 OPERATIVE REPORT   PROCEDURE PERFORMED:  Colonoscopy.   ENDOSCOPIST:  Nelwyn Salisbury, M.D.   INSTRUMENT USED:  Olympus video colonoscope.   INDICATIONS FOR PROCEDURE:  Iron-deficiency anemia in a 55 year old [place]  American female with a history of diabetes, rule out colonic polyps, masses,  hemorrhoids, etc.   PREPROCEDURE PREPARATION:  Informed consent was procured from the patient.  The patient fasted for eight hours prior to the procedure and prepped with a  bottle of magnesium citrate and a gallon of GoLYTELY the night prior to the  procedure.   PREPROCEDURE PHYSICAL:  Patient had stable vital signs.  Neck supple.  Chest  clear to auscultation.  S1 and S2 regular.  Abdomen soft with normal bowel  sounds.   DESCRIPTION OF THE PROCEDURE:  The patient was placed in the left lateral  decubitus position, sedated with an additional 2 mg of Versed intravenously.  She had received 100 mg of Demerol and 10 mg of Versed for the EGD.  Once  the patient was adequately sedated and maintained on low flow oxygen and  continuous cardiac monitoring, the Olympus video colonoscope was advanced  from the rectum to the cecum with difficulty.  There was a large amount of  residual stool in the colon.  Multiple washings were done.  A small sessile  polyp was removed by cold snare at 20 cm.  Two small sessile polyps were  biopsied of this area as well.  There were internal hemorrhoids seen on  retroflexion in the rectum.  The rest of the colonic mucosa appeared healthy  and without lesions.  Small lesions could have been  missed secondary to  relatively poor prep.   The patient tolerated the procedure well without complications.   IMPRESSION:  1. Small nonbleeding internal hemorrhoid.  2. Two small polyps biopsied at 20 cm.  Another polyp snared at 20 cm.  3. Otherwise unrevealing colonoscopy.   RECOMMENDATIONS:  1. Await pathology results.  2.     Avoid all nonsteroidals including aspirin for the next 2-3 weeks.  3. Outpatient followup in the next two weeks for further recommendations.  4. Serial CBCs will be done and iron supplementation advised as needed.                                               Nelwyn Salisbury, M.D.  JNM/MEDQ  D:  03/07/2003  T:  03/08/2003  Job:  [id number]   cc:   Ishmael Holter. Forde Dandy, M.D.  [address]  [place]  [name] [address]  Fax: [phone]

## 2010-12-24 NOTE — Cardiovascular Report (Signed)
Denise, [name]             ACCOUNT NO.:  [id number]   MEDICAL RECORD NO.:  [id number]          PATIENT TYPE:  INP   LOCATION:  2807                         FACILITY:  [facility]   PHYSICIAN:  Shelva Majestic, M.D.     DATE OF BIRTH:  [birth year]   DATE OF PROCEDURE:  DATE OF DISCHARGE:                              CARDIAC CATHETERIZATION   INDICATIONS:  [name] is a very pleasant 55 year old female who  has history of known coronary artery disease with initial catheterization in  November 2003 revealing a 20% ostial LAD stenosis, 60% proximal LAD  stenosis, 20-30% narrowing in the mid portion of the OM1 vessel, and 50-60%  proximal RCA stenosis with 20% posterolateral stenosis.  Her last Cardiolite  in December 2006 revealed fairly normal perfusion, although mild thinning  was noted anteriorly felt to be due to breast attenuation.   Recently, the patient has developed increasing episodes of exertional chest  tightness, typically relieved with rest.  She was seen, apparently in the  office yesterday, and in light of symptomatology suggestive of increased  angina, catheterization was recommended.   PROCEDURE:  After premedication with Versed 2 mg intravenously, the patient  was prepped and draped __________ .  The right femoral artery was punctured  anteriorly and a 5-French sheath was inserted without difficulty.   Diagnostic catheterization was done utilizing a 5-French Judkins 4 left  coronary catheter.  A 5-French no torque right was used for selective  angiography into the right coronary artery.  A 5-French pigtail catheter was  inserted for biplane selective arteriography as well as distal aortography.   At this time, I broke scrub and reviewed the angiograms.  Although there was  previously noted disease in the LAD and mild disease in the circumflex, it  appeared that the proximal RCA had significantly progressed to approximately  80%.  During the diagnostic  procedure, also 200 mcg of  intracoronary  nitroglycerin had been administered down the RCA without improvement in the  proximal stenosis.  Attempts were therefore made to perform percutaneous  coronary intervention to this right coronary artery.  The sheath was  upgraded to a 6-French sheath.  A bivalve root was administered and the  patient received 600 mg of oral Plavix in addition to Pepcid  drip.  An FR-4  guide with side holes was used for the interventional procedure.  After  documentation of therapeutic anticoagulation, a Forte marker wire was  advanced down the right coronary artery which helped for sizing of lesion  length.  Primary stenting was done with a 3.0 x 13-mm drug-eluting Cypher  stent dilated to 16 atmospheres x2.  A 3.25 x 12 mm Quantum balloon was used  for post stent dilatation with dilatation up to 3.27 mm.  Scout angiography  confirmed an excellent angiographic result.  The patient tolerated procedure  well.  Arterial sheath was sutured in place with plans for sheath removal  later today.   HEMODYNAMIC DATA:  Central aortic pressure is 126/67, biventricular pressure  is 126/11, post __________ 24.   ANGIOGRAPHIC DATA:  Left main coronary artery was angiographically  normal  and bifurcated into an LAD and left circumflex system.   The LAD had calcification proximally in the region of the first septal  perforator takeoff.  There was previously noted 60% to perhaps 60-70%  narrowing which was smooth in this site of calcification.  There was brisk  TIMI III flow beyond this.  The remainder of the LAD was angiographically  normal and gave rise to two additional diagonal vessels.   The circumflex vessel gave rise to two marginal vessels and ended in a  distal marginal/PLA type vessel.  There was some mild 20% tandem  irregularities in the AV groove circumflex between the OM1 and OM2 vessel.   The right coronary artery had evidence for progressive 80% stenosis   proximally.  The initial view also suggested mild narrowing prior to this,  but following IC nitroglycerin, this was spasm and improved, but the 80%  stenosis remained fixed following several doses of intracoronary  nitroglycerin.  There was mild 20% lumen irregularity of the mid RCA, the  PDA and PLA vessel.   Biplane selective arteriography revealed normal aortoiliac system with no  evidence for renal artery stenosis.  Biplane selective arteriography  revealed normal global LV contractility without significant focal wall  motion abnormalities.   Following percutaneous coronary intervention of the right coronary artery  with ultimate insertion of a 3.0 x 13-mm drug-eluting Cypher stent post  dilated with a 3.25 x 12 mm Quantum balloon to 3.27 mm, the 80% stenosis was  reduced to 0%.  There was TIMI III flow.  There was no evidence for  dissection.  The patient tolerated the procedure well.   IMPRESSION:  1. Normal left ventricular function by RAO and LAO ventriculography.  2. Calcification of the proximal left anterior descending with an      eccentric smooth 60-70% narrowing, not significantly changed from prior      study.  3. Mild lumen irregularity of 20% in the AV groove circumflex between the      OM1 and OM-2 vessel.  4. Progressive 80% stenosis in the proximal right coronary artery.  5. Successful primary stenting of the proximal right coronary artery with      insertion of a 3.0 x 13 mm Cypher stent post dilated to 3.27 mm, done      with bivalve __________ [name] mg oral Plavix load.           ______________________________  Shelva Majestic, M.D.     TK/MEDQ  D:  05/18/2006  T:  05/19/2006  Job:  [id number]   cc:   Annie Main A. Forde Dandy, M.D.

## 2011-01-14 ENCOUNTER — Ambulatory Visit
Admission: RE | Admit: 2011-01-14 | Discharge: 2011-01-14 | Disposition: A | Discharge status: Home / Self Care | Payer: 59 | Source: Ambulatory Visit | Attending: Neurological Surgery | Admitting: Neurological Surgery

## 2011-01-14 DIAGNOSIS — M542 Cervicalgia: Secondary | ICD-10-CM

## 2011-04-18 ENCOUNTER — Telehealth: Payer: Self-pay | Admitting: Internal Medicine

## 2011-04-18 MED ORDER — HYDROCODONE-HOMATROPINE 5-1.5 MG/5ML PO SYRP: 5.0000 mL | ORAL_SOLUTION | Freq: Four times a day (QID) | ORAL | Status: AC | PRN

## 2011-04-18 MED ORDER — AZITHROMYCIN 250 MG PO TABS: ORAL_TABLET | ORAL | Status: AC

## 2011-04-18 NOTE — Telephone Encounter (Signed)
Per CY-okay to give Zpak #1 take as directed no refills and Hydromet cough syrup # 297ml 1 tsp every 6 hours prn cough no refills.

## 2011-04-18 NOTE — Telephone Encounter (Signed)
Spoke with pt. She is c/o cough x 2 days, hard to cough up sputum but when does it's greenish yellow. She states that she has had chills, but unsure of fever, wheezing a little. I offered appt and she refused, stating that she will be at work all day. Please advise, thanks Allergies  Allergen Reactions  . Codeine     REACTION: hallucinations/"loopy"  . Sulfonamide Derivatives     REACTION: hives

## 2011-04-18 NOTE — Telephone Encounter (Signed)
rxs called to pharm. LMOM for pt to be made aware per her request.

## 2011-05-06 LAB — APTT: aPTT: 25

## 2011-05-06 LAB — DIFFERENTIAL
Basophils Absolute: 0.1
Basophils Relative: 1
Eosinophils Absolute: 0.3
Monocytes Relative: 7
Neutro Abs: 5.4
Neutrophils Relative %: 54

## 2011-05-06 LAB — BASIC METABOLIC PANEL
BUN: 5 — ABNORMAL LOW
CO2: 26
Calcium: 9.6
Chloride: 102
Creatinine, Ser: 0.79
GFR calc Af Amer: >60

## 2011-05-06 LAB — CBC
MCHC: 32.5
MCV: 80.1
Platelets: 281
RBC: 4.77
RDW: 14.6

## 2011-05-06 LAB — PROTIME-INR
INR: 0.9
Prothrombin Time: 12.7

## 2011-05-09 LAB — CBC
HCT: 30.7 — ABNORMAL LOW
HCT: 31.4 — ABNORMAL LOW
HCT: 31.7 — ABNORMAL LOW
HCT: 32.3 — ABNORMAL LOW
Hemoglobin: 10 — ABNORMAL LOW
Hemoglobin: 10.2 — ABNORMAL LOW
Hemoglobin: 9.9 — ABNORMAL LOW
MCHC: 32.2
MCHC: 32.2
MCHC: 32.2
MCV: 74.7 — ABNORMAL LOW
MCV: 75.7 — ABNORMAL LOW
MCV: 76.2 — ABNORMAL LOW
MCV: 76.4 — ABNORMAL LOW
Platelets: 206
Platelets: 224
Platelets: 265
RBC: 4.1
RBC: 4.17
RDW: 16.2 — ABNORMAL HIGH
RDW: 16.4 — ABNORMAL HIGH
WBC: 8.7

## 2011-05-09 LAB — BASIC METABOLIC PANEL
BUN: 15
BUN: 9
CO2: 26
CO2: 26
Calcium: 8.5
Chloride: 100
Chloride: 103
Chloride: 105
GFR calc Af Amer: >60
GFR calc Af Amer: >60
GFR calc non Af Amer: >60
Glucose, Bld: 208 — ABNORMAL HIGH
Glucose, Bld: 87
Potassium: 3.2 — ABNORMAL LOW
Potassium: 3.7
Potassium: 4
Sodium: 134 — ABNORMAL LOW
Sodium: 139

## 2011-05-09 LAB — GLUCOSE, CAPILLARY
Glucose-Capillary: 114 — ABNORMAL HIGH
Glucose-Capillary: 134 — ABNORMAL HIGH
Glucose-Capillary: 150 — ABNORMAL HIGH
Glucose-Capillary: 155 — ABNORMAL HIGH
Glucose-Capillary: 178 — ABNORMAL HIGH
Glucose-Capillary: 231 — ABNORMAL HIGH
Glucose-Capillary: 286 — ABNORMAL HIGH
Glucose-Capillary: 328 — ABNORMAL HIGH
Glucose-Capillary: 350 — ABNORMAL HIGH
Glucose-Capillary: 357 — ABNORMAL HIGH
Glucose-Capillary: 367 — ABNORMAL HIGH

## 2011-05-09 LAB — COMPREHENSIVE METABOLIC PANEL
Albumin: 3.3 — ABNORMAL LOW
BUN: 7
Calcium: 8 — ABNORMAL LOW
Glucose, Bld: 261 — ABNORMAL HIGH
Sodium: 128 — ABNORMAL LOW
Total Protein: 7.4

## 2011-05-09 LAB — FERRITIN: Ferritin: 19 (ref 10–291)

## 2011-05-09 LAB — SEDIMENTATION RATE: Sed Rate: 44 — ABNORMAL HIGH

## 2011-05-09 LAB — B-NATRIURETIC PEPTIDE (CONVERTED LAB): Pro B Natriuretic peptide (BNP): 55

## 2011-05-10 ENCOUNTER — Ambulatory Visit (INDEPENDENT_AMBULATORY_CARE_PROVIDER_SITE_OTHER): Payer: 59 | Admitting: Internal Medicine

## 2011-05-10 ENCOUNTER — Encounter: Payer: Self-pay | Admitting: Internal Medicine

## 2011-05-10 ENCOUNTER — Ambulatory Visit (INDEPENDENT_AMBULATORY_CARE_PROVIDER_SITE_OTHER)
Admission: RE | Admit: 2011-05-10 | Discharge: 2011-05-10 | Disposition: A | Discharge status: Home / Self Care | Payer: 59 | Source: Ambulatory Visit | Attending: Internal Medicine | Admitting: Internal Medicine

## 2011-05-10 VITALS — BP 100/68 | HR 72 | Ht 62.0 in | Wt 160.8 lb

## 2011-05-10 DIAGNOSIS — G4733 Obstructive sleep apnea (adult) (pediatric): Secondary | ICD-10-CM

## 2011-05-10 DIAGNOSIS — Z23 Encounter for immunization: Secondary | ICD-10-CM

## 2011-05-10 DIAGNOSIS — J209 Acute bronchitis, unspecified: Secondary | ICD-10-CM

## 2011-05-10 LAB — DIFFERENTIAL
Basophils Absolute: 0
Basophils Relative: 0
Eosinophils Absolute: 0.1
Eosinophils Relative: 1
Lymphocytes Relative: 10 — ABNORMAL LOW

## 2011-05-10 LAB — BASIC METABOLIC PANEL
BUN: 12
CO2: 25
GFR calc non Af Amer: >60
Glucose, Bld: 143 — ABNORMAL HIGH
Potassium: 3.6

## 2011-05-10 LAB — CBC
HCT: 31.6 — ABNORMAL LOW
MCHC: 32.4
MCV: 74.4 — ABNORMAL LOW
Platelets: 229
RDW: 15.4

## 2011-05-10 MED ORDER — ALBUTEROL SULFATE (2.5 MG/3ML) 0.083% IN NEBU
2.5000 mg | INHALATION_SOLUTION | Freq: Once | RESPIRATORY_TRACT | Status: AC
  Administered 2011-05-10: 2.5 mg via RESPIRATORY_TRACT

## 2011-05-10 MED ORDER — METHYLPREDNISOLONE ACETATE 80 MG/ML IJ SUSP
80.0000 mg | Freq: Once | INTRAMUSCULAR | Status: AC
  Administered 2011-05-10: 80 mg via INTRAMUSCULAR

## 2011-05-10 MED ORDER — MONTELUKAST SODIUM 10 MG PO TABS: 10.0000 mg | ORAL_TABLET | Freq: Every day | ORAL | Status: DC

## 2011-05-10 MED ORDER — HYDROCODONE-HOMATROPINE 5-1.5 MG/5ML PO SYRP: 5.0000 mL | ORAL_SOLUTION | Freq: Four times a day (QID) | ORAL | Status: DC | PRN

## 2011-05-10 NOTE — Assessment & Plan Note (Signed)
Asks why she feels "raw" mid chest. No overt reflux on nexium, but we will have her try adding otc omeprazole for a month to see if she is feeling esophagitis.  Already on Advair 250. We will give neb and depo today and see if she can feel response.  Flu vax

## 2011-05-10 NOTE — Assessment & Plan Note (Signed)
Good compliance and control on CPAP 13/[facility]. She will take her old mask to [facility] to refit.

## 2011-05-10 NOTE — Progress Notes (Signed)
Patient ID: [name], female    DOB: 25-Jun-1956, 55 y.o.   MRN: [id number]  HPI 11/08/10- 55 yo F former smoker, followed here for obstructive sleep apnea and for hx bronchitis.  She was recently treated by her PCP for acute bronchitis. Onset after mowing grass. Still coughing up thick greenish yellow mucus after augmentin they gave her. She thinks she does better with doxycycline. Denies fever, swollen glands, sore throat.  Last seen here May 10, 2010. Hx of allergy testing but never on vaccine. Pollen is bothering with nasal congestion and wheezey cough more this year.  Continues CPAP at 13 cwp all night every night.  Sleeps ok. She asks about alternatives and we discussed palatal surgeries, oral appliances and Provent nasal valves. [name] Twin sister had MI.  05/10/11- 63 yo F former smoker, followed here for obstructive sleep apnea and for hx bronchitis, allergic rhinitis, complicated by DM, hypothyroid, CAD  Since last here, Dr. Claiborne Billings placed a coronary stent. 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  CPAP compliance is good all night every night. She isn't sure about mask and we discussed going to [facility] to look at alternatives.  Wants flu vac. She still feels tight in the chest that she continues Advair 250. Some wheeze. Uses rescue inhaler and asks about getting a nebulizer machine. We had called in a Z-Pak which she says was not strong enough. She denies discolored sputum or fever. We discussed using cortisone and anticipated impact on her insulin-dependent diabetes temporarily. She complains of a "raw" substernal sensation but thinks Nexium controls her reflux well.   Review of Systems See HPI Constitutional:   No-   weight loss, night sweats, fevers, chills, fatigue, lassitude. HEENT:   No-  headaches, difficulty swallowing, tooth/dental problems, sore throat,       No-  sneezing, itching, ear ache, nasal congestion, post nasal drip,  CV:   No-   chest pain, orthopnea, PND, swelling in lower extremities, anasarca, dizziness, palpitations Resp: No- acute  shortness of breath with exertion or at rest.              No-   productive cough,  No non-productive cough,  No-  coughing up of blood.              No-   change in color of mucus. Occasional No- wheezing.   Skin: No-   rash or lesions. GI:  No-   heartburn, indigestion, abdominal pain, nausea, vomiting, diarrhea,                 change in bowel habits, loss of appetite GU: No-   dysuria, change in color of urine, no urgency or frequency.  No- flank pain. MS:  No-   joint pain or swelling.  No- decreased range of motion.  No- back pain. Neuro-   Psych:  No- change in mood or affect. No depression or anxiety.  No memory loss.      Objective:   Physical Exam General- Alert, Oriented, Affect-appropriate, Distress- none acute, well-developed well-nourished Skin- rash-none, lesions- none, excoriation- none Lymphadenopathy- none Head- atraumatic            Eyes- Gross vision intact, PERRLA, conjunctivae clear secretions            Ears- Hearing, canals            Nose- Clear, Septal dev, mucus, polyps, erosion, perforation  Throat- Mallampati IV , mucosa clear , drainage- none, tonsils- atrophic Neck- flexible , trachea midline, no stridor , thyroid nl, carotid no bruit Chest - symmetrical excursion , unlabored           Heart/CV- RRR , no murmur , no gallop  , no rub, nl s1 s2                           - JVD- none , edema- none, stasis changes- none, varices- none           Lung- clear to P&A, wheeze- trace, cough- none , dullness-none, rub- none           Chest wall-  Abd- tender-no, distended-no, bowel sounds-present, HSM- no Br/ Gen/ Rectal- Not done, not indicated Extrem- cyanosis- none, clubbing, none, atrophy- none, strength- nl Neuro- grossly intact to observation

## 2011-05-10 NOTE — Patient Instructions (Signed)
Flu vax  Order CXR   Dx bronchitis  Neb albut  Depo 80  Script sent for Singulair/ montelukast airway anti-inflammatory- once every day- try it for a month Script for cough syrup  You can try more aggressive acid blocker therapy by adding otc omeprazole 20 mg, each day- for instance take nexium before breakfast and omeprazole before supper. If you try it for a month, you should see improvement if the burning feeling in your chest is from reflux.

## 2011-05-12 ENCOUNTER — Telehealth: Payer: Self-pay | Admitting: Internal Medicine

## 2011-05-12 NOTE — Telephone Encounter (Signed)
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

## 2011-05-12 NOTE — Telephone Encounter (Signed)
LMOMTCB x 1 

## 2011-05-13 MED ORDER — FLUCONAZOLE 150 MG PO TABS: 150.0000 mg | ORAL_TABLET | Freq: Once | ORAL | Status: DC

## 2011-05-13 NOTE — Telephone Encounter (Signed)
Per CY-okay to give Diflucan 150mg  #7 take 1 po qd x 7 days no refills.

## 2011-05-13 NOTE — Telephone Encounter (Signed)
I spoke with pt and she states she was given a depo medrol injection at her ov on 10/2 and now she has a vaginal yeast infection. Pt is requesting diflucan be called into the pharmacy. Please advise Dr. Annamaria Boots. Thanks  Allergies  Allergen Reactions  . Codeine     REACTION: hallucinations/"loopy"  . Sulfonamide Derivatives     REACTION: hives    Charma Igo, CMA

## 2011-05-13 NOTE — Telephone Encounter (Signed)
Pt aware rx has been sent to pharmacy and nothing further was needed

## 2011-05-18 NOTE — Progress Notes (Signed)
Quick Note:  LMTCB 05-18-11 ______

## 2011-05-19 ENCOUNTER — Telehealth: Payer: Self-pay | Admitting: Internal Medicine

## 2011-05-19 NOTE — Telephone Encounter (Signed)
PT RETURNED CALL AGAIN. [name]

## 2011-05-19 NOTE — Telephone Encounter (Signed)
LMTCB

## 2011-05-19 NOTE — Telephone Encounter (Signed)
Pt is aware of her CXR results.

## 2011-05-19 NOTE — Progress Notes (Signed)
Quick Note:  Pt aware of results. ______ 

## 2011-07-04 ENCOUNTER — Telehealth: Payer: Self-pay | Admitting: Internal Medicine

## 2011-07-04 MED ORDER — DOXYCYCLINE HYCLATE 100 MG PO TABS: ORAL_TABLET | ORAL | Status: DC

## 2011-07-04 MED ORDER — HYDROCODONE-HOMATROPINE 5-1.5 MG/5ML PO SYRP: 5.0000 mL | ORAL_SOLUTION | Freq: Four times a day (QID) | ORAL | Status: DC | PRN

## 2011-07-04 NOTE — Telephone Encounter (Signed)
I spoke with pt and she states she forgot to mention that she would like a refill on her hycodan cough syrup as well. Please advise Dr. Annamaria Boots, thanks

## 2011-07-04 NOTE — Telephone Encounter (Signed)
Please offer doxycycline 100 mg, # 8,  2 today then one daily

## 2011-07-04 NOTE — Telephone Encounter (Signed)
Pt aware rx's has been sent to pharmacy

## 2011-07-04 NOTE — Telephone Encounter (Signed)
I spoke with pt and she c/o cough w/ brown to green phlem in the mornings and then just turns to green as the day proceeds. Pt is blowing out green phlem, chest congestion, wheezing, sinus congestion, sinus pressure x last wed. Pt states she has tried nasonex, delsym, Theraflu, coracidium, different types of antihistamines. Pt is requesting further recs from Dr. Annamaria Boots. Please advise, thanks  Allergies  Allergen Reactions  . Codeine     REACTION: hallucinations/"loopy"  . Sulfonamide Derivatives     REACTION: hives

## 2011-07-04 NOTE — Telephone Encounter (Signed)
Ok to refill her hycodan like last time. Thanks.

## 2011-08-06 ENCOUNTER — Other Ambulatory Visit: Payer: Self-pay | Admitting: Internal Medicine

## 2011-08-24 ENCOUNTER — Other Ambulatory Visit: Payer: Self-pay | Admitting: *Deleted

## 2011-08-24 MED ORDER — HYDROCODONE-HOMATROPINE 5-1.5 MG/5ML PO SYRP: 5.0000 mL | ORAL_SOLUTION | Freq: Four times a day (QID) | ORAL | Status: DC | PRN

## 2011-08-24 NOTE — Telephone Encounter (Signed)
Received a fax requesting a refill on patients hycodan, directions take one teaspoon every 6 hours as needed #220ml. Last fill date was 07-05-11. Please advise if ok to refill. Thanks. [place] Bing, CMA

## 2011-08-24 NOTE — Telephone Encounter (Signed)
Er CY ok to refill x 1. Refill sent. [place] Bing, CMA

## 2011-08-25 ENCOUNTER — Telehealth: Payer: Self-pay | Admitting: Internal Medicine

## 2011-08-25 MED ORDER — DOXYCYCLINE HYCLATE 100 MG PO TABS: ORAL_TABLET | ORAL | Status: DC

## 2011-08-25 NOTE — Telephone Encounter (Signed)
Called and spoke with pt.  Informed her of CY's recs.  And rx sent to pharmacy.  Pt verbalized understanding.

## 2011-08-25 NOTE — Telephone Encounter (Signed)
Per CY-okay to give Doxycycline 100 mg #8 take 2 today then 1 daily until gone no refills.

## 2011-08-25 NOTE — Telephone Encounter (Signed)
Called spoke with patient who c/o chills/sweats, prod cough with small amounts of yellow/green mucus, burning in chest, increased SOB, some wheezing, sinus congestion with thick green nasal drainage, PND x4days.  Has been taking coricidin, mucinex DM 1200mg  BID, delsym. *advised pt that mucinex dm and delsym are the same, and to only take one or the other.  Per pt's chart, was given refill on hycodan 1.16.13.  [facility] in [place].  Okay to LM on VM.  Allergies  Allergen Reactions  . Codeine     REACTION: hallucinations/"loopy"  . Sulfonamide Derivatives     REACTION: hives   Dr Annamaria Boots please advise, thanks.  Last ov: 05/2011, next 11/2011.

## 2011-08-26 ENCOUNTER — Telehealth: Payer: Self-pay | Admitting: Internal Medicine

## 2011-08-26 NOTE — Telephone Encounter (Signed)
Spoke with patient-was told by pharmacy that no RX was ever called in. I called and spoke with [facility] pharmacist and was told the same thing; I gave verbal order for RX and patient is aware that pharmacist is working on this order now; if any other questions or concerns patient will call our office.

## 2011-08-27 ENCOUNTER — Emergency Department (HOSPITAL_COMMUNITY): Payer: 59

## 2011-08-27 ENCOUNTER — Emergency Department (INDEPENDENT_AMBULATORY_CARE_PROVIDER_SITE_OTHER): Payer: 59

## 2011-08-27 ENCOUNTER — Emergency Department (HOSPITAL_BASED_OUTPATIENT_CLINIC_OR_DEPARTMENT_OTHER): Payer: 59

## 2011-08-27 ENCOUNTER — Encounter (HOSPITAL_BASED_OUTPATIENT_CLINIC_OR_DEPARTMENT_OTHER): Payer: Self-pay | Admitting: *Deleted

## 2011-08-27 ENCOUNTER — Emergency Department (HOSPITAL_BASED_OUTPATIENT_CLINIC_OR_DEPARTMENT_OTHER)
Admission: EM | Admit: 2011-08-27 | Discharge: 2011-08-27 | Disposition: A | Discharge status: Home / Self Care | Payer: 59 | Attending: Emergency Medicine | Admitting: Emergency Medicine

## 2011-08-27 DIAGNOSIS — Z79899 Other long term (current) drug therapy: Secondary | ICD-10-CM | POA: Insufficient documentation

## 2011-08-27 DIAGNOSIS — J209 Acute bronchitis, unspecified: Secondary | ICD-10-CM

## 2011-08-27 DIAGNOSIS — J42 Unspecified chronic bronchitis: Secondary | ICD-10-CM | POA: Insufficient documentation

## 2011-08-27 DIAGNOSIS — R509 Fever, unspecified: Secondary | ICD-10-CM

## 2011-08-27 DIAGNOSIS — J45901 Unspecified asthma with (acute) exacerbation: Secondary | ICD-10-CM | POA: Insufficient documentation

## 2011-08-27 DIAGNOSIS — E119 Type 2 diabetes mellitus without complications: Secondary | ICD-10-CM | POA: Insufficient documentation

## 2011-08-27 DIAGNOSIS — G4733 Obstructive sleep apnea (adult) (pediatric): Secondary | ICD-10-CM | POA: Insufficient documentation

## 2011-08-27 DIAGNOSIS — E039 Hypothyroidism, unspecified: Secondary | ICD-10-CM | POA: Insufficient documentation

## 2011-08-27 DIAGNOSIS — J3489 Other specified disorders of nose and nasal sinuses: Secondary | ICD-10-CM | POA: Insufficient documentation

## 2011-08-27 DIAGNOSIS — R05 Cough: Secondary | ICD-10-CM

## 2011-08-27 DIAGNOSIS — R059 Cough, unspecified: Secondary | ICD-10-CM

## 2011-08-27 MED ORDER — PREDNISONE 50 MG PO TABS
60.0000 mg | ORAL_TABLET | Freq: Once | ORAL | Status: AC
  Administered 2011-08-27: 60 mg via ORAL
  Filled 2011-08-27: qty 1

## 2011-08-27 MED ORDER — ALBUTEROL SULFATE (5 MG/ML) 0.5% IN NEBU
5.0000 mg | INHALATION_SOLUTION | Freq: Once | RESPIRATORY_TRACT | Status: AC
  Administered 2011-08-27: 5 mg via RESPIRATORY_TRACT

## 2011-08-27 MED ORDER — IPRATROPIUM BROMIDE 0.02 % IN SOLN
RESPIRATORY_TRACT | Status: AC
  Administered 2011-08-27: 0.5 mg via RESPIRATORY_TRACT
  Filled 2011-08-27: qty 2.5

## 2011-08-27 MED ORDER — ALBUTEROL SULFATE (5 MG/ML) 0.5% IN NEBU
INHALATION_SOLUTION | RESPIRATORY_TRACT | Status: AC
  Administered 2011-08-27: 5 mg via RESPIRATORY_TRACT
  Filled 2011-08-27: qty 1

## 2011-08-27 MED ORDER — IPRATROPIUM BROMIDE 0.02 % IN SOLN
0.5000 mg | Freq: Once | RESPIRATORY_TRACT | Status: AC
  Administered 2011-08-27: 0.5 mg via RESPIRATORY_TRACT

## 2011-08-27 MED ORDER — PREDNISONE 20 MG PO TABS: ORAL_TABLET | ORAL | Status: AC

## 2011-08-27 MED ORDER — ALBUTEROL SULFATE (5 MG/ML) 0.5% IN NEBU
INHALATION_SOLUTION | RESPIRATORY_TRACT | Status: AC
  Filled 2011-08-27: qty 1

## 2011-08-27 NOTE — ED Notes (Signed)
Pt has an hx of asthma and uses Advair and Albuterol MDI as needed to tx her asthma.

## 2011-08-27 NOTE — ED Notes (Signed)
Peak flow assessment 20 mins after tx 550 and FEV1-3.2. Pt states that she feels somewhat better but still has some SHOB.

## 2011-08-27 NOTE — ED Notes (Signed)
Pt with cough and congestion states that sx are similar to previous dx of bronchitis

## 2011-08-27 NOTE — ED Notes (Signed)
Pt states that she feels a lot better and that she is no longer feels so tight but still has some expiratory wheezing.

## 2011-08-27 NOTE — ED Provider Notes (Signed)
History    Scribed for Babette Relic, MD, the patient was seen in room MH07/MH07. This chart was scribed by Lyndee Hensen.   CSN: [id number]  Arrival date & time 08/27/11  2151   First MD Initiated Contact with Patient 08/27/11 2310      Chief Complaint  Patient presents with  . Cough  . Nasal Congestion    (Consider location/radiation/quality/duration/timing/severity/associated sxs/prior treatment) HPI [name] is a 56 y.o. female who presents to the Emergency Department complaining of worsening persistent chronic cough and nasal congestion that began 3 to 4 days ago.  Productive cough with yellow green sputum.  Patient reports similar symptoms with bronchitis.  Patient is followed by Pulmonologist Dr. Annamaria Boots.  Patient quit smoking a few months ago.  Patient began taking doxycycline yesterday for worsening of chronic cough.  Patient has used inhaler.  There is no current steroid use.  Patient reports relief with long acting steroid shot.  Symptoms are associated with wheezing, fever 101 F and shortness of breath.  Patient reports vomiting that resolved several days ago.  Symptoms are not associated with abdominal pain or weakness.     Past Medical History  Diagnosis Date  . Unspecified hypothyroidism   . Cough   . Acute bronchitis   . Obstructive sleep apnea (adult) (pediatric)   . Coronary atherosclerosis   . Type II or unspecified type diabetes mellitus without mention of complication, not stated as uncontrolled     Past Surgical History  Procedure Date  . Tubal ligation   . Anterior fusion cervical spine     Family History  Problem Relation Age of Onset  . Hypertension Mother   . Heart disease Mother   . Prostate cancer Father     History  Substance Use Topics  . Smoking status: Former Smoker -- 0.1 packs/day for 30 years    Quit date: 08/09/2003  . Smokeless tobacco: Never Used  . Alcohol Use: Not on file    OB History    Grav Para Term Preterm  Abortions TAB SAB Ect Mult Living                  Review of Systems  Constitutional: Positive for fever.       10 Systems reviewed and are negative for acute change except as noted in the HPI.  HENT: Positive for congestion.   Eyes: Negative for discharge and redness.  Respiratory: Positive for cough, shortness of breath and wheezing.   Cardiovascular: Negative for chest pain.  Gastrointestinal: Negative for vomiting and abdominal pain.  Musculoskeletal: Negative for back pain.  Skin: Negative for rash.  Neurological: Negative for syncope, numbness and headaches.  Psychiatric/Behavioral:       No behavior change.    Allergies  Codeine and Sulfonamide derivatives  Home Medications   Current Outpatient Rx  Name Route Sig Dispense Refill  . ALBUTEROL SULFATE HFA 108 (90 BASE) MCG/ACT IN AERS Inhalation Inhale 2 puffs into the lungs every 6 (six) hours as needed. For shortness of breath and wheezing    . LIPITOR PO Oral Take 1 tablet by mouth daily.    Marland Kitchen CLOPIDOGREL BISULFATE 75 MG PO TABS Oral Take 75 mg by mouth daily.      Marland Kitchen DICLOFENAC SODIUM 75 MG PO TBEC Oral Take 75 mg by mouth daily as needed. For muscle aches    . DOXYCYCLINE HYCLATE 100 MG PO TABS  Take 2 today then 1 daily until gone 8 tablet  0  . ESOMEPRAZOLE MAGNESIUM 40 MG PO CPDR Oral Take 40 mg by mouth daily before breakfast.      . FERROUS SULFATE 325 (65 FE) MG PO TABS Oral Take 325 mg by mouth once a week.    . OMEGA-3 FATTY ACIDS 1000 MG PO CAPS Oral Take 2 g by mouth 2 (two) times daily.      Marland Kitchen FLUTICASONE-SALMETEROL 250-50 MCG/DOSE IN AEPB Inhalation Inhale 1 puff into the lungs 2 (two) times daily. Rinse mouth 1 each prn  . FUROSEMIDE 40 MG PO TABS Oral Take 40 mg by mouth daily as needed. For fluid retention    . GUAIFENESIN ER 600 MG PO TB12 Oral Take 1,200 mg by mouth 2 (two) times daily.      Marland Kitchen HYDROCODONE-HOMATROPINE 5-1.5 MG/5ML PO SYRP Oral Take 5 mLs by mouth every 6 (six) hours as needed for cough.  200 mL 0  . ISOSORBIDE MONONITRATE ER 120 MG PO TB24 Oral Take 120 mg by mouth daily.     Marland Kitchen LEVOTHYROXINE SODIUM 175 MCG PO TABS Oral Take 175 mcg by mouth daily.      Marland Kitchen LIRAGLUTIDE 18 MG/3ML [place] SOLN Subcutaneous Inject 1.2 mLs into the skin daily. Use as directed    . METFORMIN HCL ER (MOD) 1000 MG PO TB24 Oral Take 1,000 mg by mouth 2 (two) times daily with a meal.      . METOPROLOL SUCCINATE ER 25 MG PO TB24 Oral Take 25 mg by mouth daily.      Marland Kitchen MONTELUKAST SODIUM 10 MG PO TABS      . NOVOTWIST 32G X 5 MM MISC      . ONETOUCH ULTRA BLUE VI STRP      . TRIAMTERENE-HCTZ 37.5-25 MG PO CAPS Oral Take 1 capsule by mouth every morning.      Marland Kitchen VERAPAMIL HCL 240 MG (CO) PO TB24 Oral Take 240 mg by mouth daily.     Marland Kitchen VITAMIN B-12 1000 MCG PO TABS Oral Take 1,000 mcg by mouth daily.    Marland Kitchen PREDNISONE 20 MG PO TABS  2 tabs po daily x 4 days 8 tablet 0    BP 106/56  Pulse 72  Temp(Src) 97.6 F (36.4 C) (Oral)  Resp 20  SpO2 95%  Physical Exam  Nursing note and vitals reviewed. Constitutional:       Awake, alert, nontoxic appearance.  HENT:  Head: Atraumatic.  Eyes: Right eye exhibits no discharge. Left eye exhibits no discharge.  Neck: Neck supple.  Cardiovascular: Normal rate, regular rhythm and normal heart sounds.   Pulmonary/Chest: Effort normal. She has wheezes. She has no rales. She exhibits tenderness.       No retractions, no accessory muscle use diffuse wheezing present but able to speak full sentences. Reproducible chest wall tenderness   Abdominal: Soft. There is no tenderness. There is no rebound.  Musculoskeletal: She exhibits no tenderness.       Upper back tender, legs non tender  Lymphadenopathy:    She has no cervical adenopathy.  Neurological:       Mental status and motor strength appears baseline for patient and situation.  Skin: No rash noted.  Psychiatric: She has a normal mood and affect.    ED Course  Procedures (including critical care time)   DIAGNOSTIC  STUDIES: Oxygen Saturation is 96% on room air, normal by my interpretation.    COORDINATION OF CARE: 11:11 PM  CXR negative.  Patient had doxycyline called in by  PMD.  11:18 PM  Physical exam complete.  Will order prednisone.   11:18 PM  Proventil and atrovent nebulizer treatment.   11:45 PM  Patient feel much better and is ready to go home.  Plan to discharge patient.  Patient agrees with plan.       LABS / RADIOLOGY:   Labs Reviewed - No data to display Dg Chest 2 View  08/27/2011  *RADIOLOGY REPORT*  Clinical Data: Cough, fever.  CHEST - 2 VIEW  Comparison: 05/10/2011  Findings: Mild peribronchial thickening. Heart and mediastinal contours are within normal limits.  No focal opacities or effusions.  No acute bony abnormality.  IMPRESSION: Mild bronchitic changes.  Original Report Authenticated By: [name], M.D.     1. Asthma attack   2. Bronchitis, chronic with acute exacerbation       MDM  I doubt any other EMC precluding discharge at this time including, but not necessarily limited to the following:ACS, sepsis.     I personally performed the services described in this documentation, which was scribed in my presence. The recorded information has been reviewed and considered.   Babette Relic, MD 08/28/11 1128

## 2011-08-29 ENCOUNTER — Ambulatory Visit (INDEPENDENT_AMBULATORY_CARE_PROVIDER_SITE_OTHER): Payer: 59 | Admitting: Internal Medicine

## 2011-08-29 ENCOUNTER — Encounter: Payer: Self-pay | Admitting: Internal Medicine

## 2011-08-29 VITALS — BP 126/80 | HR 100 | Ht 62.0 in | Wt 162.2 lb

## 2011-08-29 DIAGNOSIS — R05 Cough: Secondary | ICD-10-CM

## 2011-08-29 DIAGNOSIS — J209 Acute bronchitis, unspecified: Secondary | ICD-10-CM

## 2011-08-29 DIAGNOSIS — R059 Cough, unspecified: Secondary | ICD-10-CM

## 2011-08-29 MED ORDER — PREDNISONE 10 MG PO TABS: ORAL_TABLET | ORAL | Status: DC

## 2011-08-29 MED ORDER — LEVALBUTEROL HCL 0.63 MG/3ML IN NEBU
0.6300 mg | INHALATION_SOLUTION | Freq: Once | RESPIRATORY_TRACT | Status: AC
  Administered 2011-08-29: 0.63 mg via RESPIRATORY_TRACT

## 2011-08-29 MED ORDER — METHYLPREDNISOLONE ACETATE 80 MG/ML IJ SUSP
80.0000 mg | Freq: Once | INTRAMUSCULAR | Status: AC
  Administered 2011-08-29: 80 mg via INTRAMUSCULAR

## 2011-08-29 MED ORDER — CEFDINIR 300 MG PO CAPS: 300.0000 mg | ORAL_CAPSULE | Freq: Two times a day (BID) | ORAL | Status: AC

## 2011-08-29 NOTE — Patient Instructions (Addendum)
Neb xop 0.63  Depo 80  Scripts to hold for cefdinir antibiotic and prednisone taper. Finish the doxycycline and prednisone you have, then start the new ones if not doing well.  Be sparing with the cough syrup.

## 2011-08-29 NOTE — Progress Notes (Signed)
Patient ID: [name], female    DOB: [birth year], 56 y.o.   MRN: [id number]  HPI 11/08/10- 71 yo F former smoker, followed here for obstructive sleep apnea and for hx bronchitis.  She was recently treated by her PCP for acute bronchitis. Onset after mowing grass. Still coughing up thick greenish yellow mucus after augmentin they gave her. She thinks she does better with doxycycline. Denies fever, swollen glands, sore throat.  Last seen here May 10, 2010. Hx of allergy testing but never on vaccine. Pollen is bothering with nasal congestion and wheezey cough more this year.  Continues CPAP at 13 cwp all night every night.  Sleeps ok. She asks about alternatives and we discussed palatal surgeries, oral appliances and Provent nasal valves. [name] Twin sister had MI.  05/10/11- 73 yo F former smoker, followed here for obstructive sleep apnea and for hx bronchitis, allergic rhinitis, complicated by DM, hypothyroid, CAD  Since last here, Dr. Claiborne Billings placed a coronary stent. 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  CPAP compliance is good all night every night. She isn't sure about mask and we discussed going to [facility] to look at alternatives.  Wants flu vac. She still feels tight in the chest that she continues Advair 250. Some wheeze. Uses rescue inhaler and asks about getting a nebulizer machine. We had called in a Z-Pak which she says was not strong enough. She denies discolored sputum or fever. We discussed using cortisone and anticipated impact on her insulin-dependent diabetes temporarily. She complains of a "raw" substernal sensation but thinks Nexium controls her reflux well.  08/29/11-  40 yo F former smoker, followed here for obstructive sleep apnea and for hx bronchitis, allergic rhinitis, complicated by DM, hypothyroid, CAD  Acute visit-  deep cough-yellow in color and streaks of blood at times; SOB and wheezing-went to ER Saturday([facility])Lower  back pain  she did have flu vaccine. Last week had GI upset with vomiting and right upper quadrant pain. She still has her gallbladder. That discomfort has eased off. 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 As her GI upset resolved, she developed chest tightness with cough and cold symptoms, fever and chills 3 days ago now resolved. Head and chest are still congested. [facility] his nebulizer treatment x2 started prednisone taper. She  Is finishing doxycycline with 2 days left. CHEST - 2 VIEW 08/27/11-  Comparison: 05/10/2011  Findings: Mild peribronchial thickening. Heart and mediastinal  contours are within normal limits. No focal opacities or  effusions. No acute bony abnormality.  IMPRESSION:  Mild bronchitic changes.  Original Report Authenticated By: [name], M.D.   Review of Systems See HPI Constitutional:   No-   weight loss, night sweats, fevers, chills, fatigue, lassitude. HEENT:   No-  headaches, difficulty swallowing, tooth/dental problems, sore throat,       No-  sneezing, itching, ear ache,  + nasal congestion, post nasal drip,  CV:  No-   chest pain, orthopnea, PND, swelling in lower extremities, anasarca, dizziness, palpitations Resp: No- acute  shortness of breath with exertion or at rest.              + productive cough,  + non-productive cough,  +  coughing up of blood- has happened before [place] a.              No-   change  in color of mucus. Occasional No- wheezing.   Skin: No-   rash or lesions. GI:  No-   heartburn, indigestion, abdominal pain, nausea, vomiting, diarrhea,                 change in bowel habits, loss of appetite GU: MS:  No-   joint pain or swelling.  No- decreased range of motion.  No- back pain. Neuro-   Psych:  No- change in mood or affect. No depression or anxiety.  No memory loss.      Objective:   Physical Exam General- Alert, Oriented, Affect-appropriate, Distress- none  acute, well-developed well-nourished Skin- rash-none, lesions- none, excoriation- none Lymphadenopathy- none Head- atraumatic            Eyes- Gross vision intact, PERRLA, conjunctivae clear secretions            Ears- Hearing, canals            Nose- Clear, Septal dev, mucus, polyps, erosion, perforation             Throat- Mallampati IV , mucosa clear , drainage- none, tonsils- atrophic Neck- flexible , trachea midline, no stridor , thyroid nl, carotid no bruit Chest - symmetrical excursion , unlabored           Heart/CV- RRR , no murmur , no gallop  , no rub, nl s1 s2                           - JVD- none , edema- none, stasis changes- none, varices- none           Lung- wheeze and rhonchi, cough- none , dullness-none, rub- none           Chest wall-  Abd- slight tenderness RUL Br/ Gen/ Rectal- Not done, not indicated Extrem- cyanosis- none, clubbing, none, atrophy- none, strength- nl Neuro- grossly intact to observation

## 2011-09-01 NOTE — Assessment & Plan Note (Signed)
Upper respiratory infection/ bronchitis pattern typical of the current respiratory illnesses in the community. She describes a resolving GI symptoms complex that may have been a separate viral gastroenteritis. She describes some streak hemoptysis with hard coughing. This has happened before without progression.

## 2011-09-01 NOTE — Assessment & Plan Note (Signed)
We have emphasized reflux precautions and alternatives to reduce use of steroid cough medication.

## 2011-10-12 ENCOUNTER — Telehealth: Payer: Self-pay | Admitting: Internal Medicine

## 2011-10-12 MED ORDER — AZITHROMYCIN 250 MG PO TABS: ORAL_TABLET | ORAL | Status: AC

## 2011-10-12 NOTE — Telephone Encounter (Signed)
Pt returned call

## 2011-10-12 NOTE — Telephone Encounter (Signed)
LMOM for pt TCB to get more details

## 2011-10-12 NOTE — Telephone Encounter (Signed)
Pt had returned call but hung up before the call could be transferred.  Will continue to hold and await her call back.

## 2011-10-12 NOTE — Telephone Encounter (Signed)
Called, spoke with pt.  States last weekend she started with a dry cough and head congestion.  It has now moved into her chest.  She reports head and chest congestion now, cough - prod at time with yellowish green mucus, a rattling in chest, increased SOB, slight wheezing, and slight chest tightness.  Denies f/c/s.  Taking mucinex 1200mg  q12h and using rescue inhaler with relief.  Requesting further recs.  Dr. Annamaria Boots, pls advise.  Thanks!  [facility] [place] Allergies verified Allergies  Allergen Reactions  . Codeine     REACTION: hallucinations/"loopy"  . Sulfonamide Derivatives Hives

## 2011-10-12 NOTE — Telephone Encounter (Signed)
Per CY-okay to give Zpak #1 take as directed no refills; I have sent to [facility] on [address].  Left message on voicemail that we sent Rx to drug store and if any questions or concerns please call the office in the morning.

## 2011-10-13 NOTE — Telephone Encounter (Signed)
Pt returned Katie's call.  Advised that we have sent her a zpak to [facility].  Pt verbalized her understanding and will call if symptoms do not improve or worsen.

## 2011-10-18 ENCOUNTER — Telehealth: Payer: Self-pay | Admitting: Pulmonary Disease

## 2011-10-18 ENCOUNTER — Telehealth: Payer: Self-pay | Admitting: Internal Medicine

## 2011-10-18 MED ORDER — AMOXICILLIN 500 MG PO CAPS: 500.0000 mg | ORAL_CAPSULE | Freq: Three times a day (TID) | ORAL | Status: AC

## 2011-10-18 MED ORDER — PREDNISONE 20 MG PO TABS: 20.0000 mg | ORAL_TABLET | Freq: Every day | ORAL | Status: AC

## 2011-10-18 NOTE — Telephone Encounter (Signed)
Error.[name] ° °

## 2011-10-18 NOTE — Telephone Encounter (Signed)
Per CY-okay to give Amoxicillin 500 mg #21 take 1 po tid no refills and Prednisone 20 mg #4 take 1 daily x 4 days no refills. Pt is aware of Rx's and sent to [facility].

## 2011-10-18 NOTE — Telephone Encounter (Signed)
lmomtcb  

## 2011-10-18 NOTE — Telephone Encounter (Signed)
Called and spoke with pt.  Pt states she finished z pak yesterday and is still not feeling well.  C/o head and chest congestion, wheezing, "rattling in chest" chest feels tight, difficulty getting sputum up but when she does it is light yellow in color.  Denies fever.  Pt is requesting CY's recs.  Pt also wanted to know if she can get an order for a neb machine and neb meds to help her when she has these flare ups.    Cy, please advise. Thanks.  Allergies  Allergen Reactions  . Codeine     REACTION: hallucinations/"loopy"  . Sulfonamide Derivatives Hives

## 2011-10-18 NOTE — Telephone Encounter (Signed)
Pt returned call. Call her back after 5 today please. Mariann Laster

## 2011-10-18 NOTE — Telephone Encounter (Signed)
LMOM for pt to get more details regarding her sx.

## 2011-11-08 ENCOUNTER — Encounter: Payer: Self-pay | Admitting: Internal Medicine

## 2011-11-08 ENCOUNTER — Ambulatory Visit (INDEPENDENT_AMBULATORY_CARE_PROVIDER_SITE_OTHER): Payer: 59 | Admitting: Internal Medicine

## 2011-11-08 VITALS — BP 138/80 | HR 84 | Ht 62.0 in | Wt 161.6 lb

## 2011-11-08 DIAGNOSIS — J45909 Unspecified asthma, uncomplicated: Secondary | ICD-10-CM

## 2011-11-08 DIAGNOSIS — R05 Cough: Secondary | ICD-10-CM

## 2011-11-08 DIAGNOSIS — J309 Allergic rhinitis, unspecified: Secondary | ICD-10-CM

## 2011-11-08 DIAGNOSIS — G4733 Obstructive sleep apnea (adult) (pediatric): Secondary | ICD-10-CM

## 2011-11-08 DIAGNOSIS — R059 Cough, unspecified: Secondary | ICD-10-CM

## 2011-11-08 MED ORDER — ALBUTEROL SULFATE HFA 108 (90 BASE) MCG/ACT IN AERS: 2.0000 | INHALATION_SPRAY | Freq: Four times a day (QID) | RESPIRATORY_TRACT | Status: DC | PRN

## 2011-11-08 MED ORDER — COMPRESSOR/NEBULIZER MISC: 1.0000 | Freq: Once | Status: AC

## 2011-11-08 MED ORDER — ACLIDINIUM BROMIDE 400 MCG/ACT IN AEPB: 1.0000 | INHALATION_SPRAY | Freq: Two times a day (BID) | RESPIRATORY_TRACT | Status: DC

## 2011-11-08 MED ORDER — FLUCONAZOLE 150 MG PO TABS: 150.0000 mg | ORAL_TABLET | Freq: Every day | ORAL | Status: AC

## 2011-11-08 MED ORDER — HYDROCODONE-HOMATROPINE 5-1.5 MG/5ML PO SYRP: 5.0000 mL | ORAL_SOLUTION | Freq: Four times a day (QID) | ORAL | Status: DC | PRN

## 2011-11-08 MED ORDER — ALBUTEROL SULFATE (2.5 MG/3ML) 0.083% IN NEBU: 2.5000 mg | INHALATION_SOLUTION | Freq: Four times a day (QID) | RESPIRATORY_TRACT | Status: DC | PRN

## 2011-11-08 NOTE — Patient Instructions (Addendum)
Script refill cough syrup- please use sparingly, it is addictive  Sample and script to add Tudorza inhaler  1 puff twice daily. This can be used with Advair.    Order- DME home nebulizer compressor  Script for DME nebulizer albuterol solution  Script for Diflucan for thrush  Script for Albuterol HFA rescue inhaler

## 2011-11-08 NOTE — Progress Notes (Signed)
Patient ID: [name], female    DOB: 1956-03-28, 56 y.o.   MRN: [id number]  HPI 11/08/10- 69 yo F former smoker, followed here for obstructive sleep apnea and for hx bronchitis.  She was recently treated by her PCP for acute bronchitis. Onset after mowing grass. Still coughing up thick greenish yellow mucus after augmentin they gave her. She thinks she does better with doxycycline. Denies fever, swollen glands, sore throat.  Last seen here May 10, 2010. Hx of allergy testing but never on vaccine. Pollen is bothering with nasal congestion and wheezey cough more this year.  Continues CPAP at 13 cwp all night every night.  Sleeps ok. She asks about alternatives and we discussed palatal surgeries, oral appliances and Provent nasal valves. [name] Twin sister had MI.  05/10/11- 78 yo F former smoker, followed here for obstructive sleep apnea and for hx bronchitis, allergic rhinitis, complicated by DM, hypothyroid, CAD  Since last here, Dr. Claiborne Billings placed a coronary stent. 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  CPAP compliance is good all night every night. She isn't sure about mask and we discussed going to [facility] to look at alternatives.  Wants flu vac. She still feels tight in the chest that she continues Advair 250. Some wheeze. Uses rescue inhaler and asks about getting a nebulizer machine. We had called in a Z-Pak which she says was not strong enough. She denies discolored sputum or fever. We discussed using cortisone and anticipated impact on her insulin-dependent diabetes temporarily. She complains of a "raw" substernal sensation but thinks Nexium controls her reflux well.  08/29/11-  80 yo F former smoker, followed here for obstructive sleep apnea and for hx bronchitis, allergic rhinitis, complicated by DM, hypothyroid, CAD  Acute visit-  deep cough-yellow in color and streaks of blood at times; SOB and wheezing-went to ER Saturday([facility])Lower  back pain  she did have flu vaccine. Last week had GI upset with vomiting and right upper quadrant pain. She still has her gallbladder. That discomfort has eased off. 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 As her GI upset resolved, she developed chest tightness with cough and cold symptoms, fever and chills 3 days ago now resolved. Head and chest are still congested. [facility] his nebulizer treatment x2 started prednisone taper. She  Is finishing doxycycline with 2 days left. CHEST - 2 VIEW 08/27/11-  Comparison: 05/10/2011  Findings: Mild peribronchial thickening. Heart and mediastinal  contours are within normal limits. No focal opacities or  effusions. No acute bony abnormality.  IMPRESSION:  Mild bronchitic changes.  Original Report Authenticated By: [name], M.D.   11/08/11- 56 yo F former smoker, followed here for obstructive sleep apnea and for hx bronchitis, allergic rhinitis, complicated by DM, hypothyroid, CAD  Continues using CPAP 13 almost every night. No sleeping pills. Nebulizer treatment and Depo-Medrol did help her breathing last visit. She would like a home nebulizer which we discussed. Singulair did not help. Blames her cough on postnasal drip or getting too hot. Has history of occasional thrush which might also contribute to throat discomfort.  Review of Systems-See HPI Constitutional:   No-   weight loss, night sweats, fevers, chills, fatigue, lassitude. HEENT:   No-  headaches, difficulty swallowing, tooth/dental problems, sore throat,       No-  sneezing, itching, ear ache,  + nasal congestion, post nasal  drip,  CV:  No-   chest pain, orthopnea, PND, swelling in lower extremities, anasarca, dizziness, palpitations Resp: No- acute  shortness of breath with exertion or at rest.              + productive cough,  + non-productive cough,  No- hemoptysis             No-   change in color of mucus. Occasional   wheezing.   Skin: No-   rash or lesions. GI:  Controlled heartburn, indigestion, abdominal pain, nausea, vomiting,  GU: MS:  No-   joint pain or swelling.   Neuro-   Psych:  No- change in mood or affect. No depression or anxiety.  No memory loss.      Objective:   Physical Exam General- Alert, Oriented, Affect-appropriate, Distress- none acute, well-developed well-nourished Skin- rash-none, lesions- none, excoriation- none Lymphadenopathy- none Head- atraumatic            Eyes- Gross vision intact, PERRLA, conjunctivae clear secretions, + periorbital edema            Ears- Hearing, canals            Nose- Clear, Septal dev, mucus, polyps, erosion, perforation             Throat- Mallampati III- IV , mucosa clear , drainage- none, tonsils- atrophic, no thrush Neck- flexible , trachea midline, no stridor , thyroid nl, carotid no bruit Chest - symmetrical excursion , unlabored           Heart/CV- RRR , no murmur , no gallop  , no rub, nl s1 s2                           - JVD- none , edema- none, stasis changes- none, varices- none           Lung- wheeze RUL, cough- none , dullness-none, rub- none           Chest wall-  Abd-  Br/ Gen/ Rectal- Not done, not indicated Extrem- cyanosis- none, clubbing, none, atrophy- none, strength- nl Neuro- grossly intact to observation

## 2011-11-16 NOTE — Assessment & Plan Note (Signed)
Perennial and seasonal rhinitis with postnasal drip. We are going to give an anticholinergic inhaler for her chest and see if this dries up the drainage from her upper airway as well.

## 2011-11-16 NOTE — Assessment & Plan Note (Signed)
Good compliance and control 

## 2011-11-16 NOTE — Assessment & Plan Note (Signed)
Chronic asthmatic bronchitis component. 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 Plan home nebulizer. Sample- Tudorza Diflucan in case thrush causes throat irritation.

## 2011-12-14 ENCOUNTER — Other Ambulatory Visit: Payer: Self-pay | Admitting: *Deleted

## 2011-12-14 MED ORDER — HYDROCODONE-HOMATROPINE 5-1.5 MG/5ML PO SYRP: 5.0000 mL | ORAL_SOLUTION | Freq: Four times a day (QID) | ORAL | Status: DC | PRN

## 2011-12-14 NOTE — Telephone Encounter (Signed)
Per CY okay to refill.  

## 2011-12-14 NOTE — Telephone Encounter (Signed)
Received refill request on pt's hycodan cough syrup. Last given 11/08/11 #200 ML x 0 refills. Lart OV 11/08/11. Please advise Dr. Annamaria Boots, thanks

## 2011-12-14 NOTE — Telephone Encounter (Signed)
Refill called to pharmacy per Dr Annamaria Boots

## 2012-02-03 ENCOUNTER — Other Ambulatory Visit: Payer: Self-pay | Admitting: Internal Medicine

## 2012-02-22 ENCOUNTER — Emergency Department (HOSPITAL_BASED_OUTPATIENT_CLINIC_OR_DEPARTMENT_OTHER): Payer: 59

## 2012-02-22 ENCOUNTER — Encounter (HOSPITAL_BASED_OUTPATIENT_CLINIC_OR_DEPARTMENT_OTHER): Payer: Self-pay | Admitting: *Deleted

## 2012-02-22 ENCOUNTER — Emergency Department (HOSPITAL_BASED_OUTPATIENT_CLINIC_OR_DEPARTMENT_OTHER)
Admission: EM | Admit: 2012-02-22 | Discharge: 2012-02-22 | Disposition: A | Discharge status: Home / Self Care | Payer: 59 | Attending: Emergency Medicine | Admitting: Emergency Medicine

## 2012-02-22 DIAGNOSIS — S92402A Displaced unspecified fracture of left great toe, initial encounter for closed fracture: Secondary | ICD-10-CM

## 2012-02-22 DIAGNOSIS — W208XXA Other cause of strike by thrown, projected or falling object, initial encounter: Secondary | ICD-10-CM | POA: Insufficient documentation

## 2012-02-22 DIAGNOSIS — Y998 Other external cause status: Secondary | ICD-10-CM | POA: Insufficient documentation

## 2012-02-22 DIAGNOSIS — Z87891 Personal history of nicotine dependence: Secondary | ICD-10-CM | POA: Insufficient documentation

## 2012-02-22 DIAGNOSIS — E039 Hypothyroidism, unspecified: Secondary | ICD-10-CM | POA: Insufficient documentation

## 2012-02-22 DIAGNOSIS — G4733 Obstructive sleep apnea (adult) (pediatric): Secondary | ICD-10-CM | POA: Insufficient documentation

## 2012-02-22 DIAGNOSIS — S92919A Unspecified fracture of unspecified toe(s), initial encounter for closed fracture: Secondary | ICD-10-CM | POA: Insufficient documentation

## 2012-02-22 DIAGNOSIS — E119 Type 2 diabetes mellitus without complications: Secondary | ICD-10-CM | POA: Insufficient documentation

## 2012-02-22 DIAGNOSIS — Y9389 Activity, other specified: Secondary | ICD-10-CM | POA: Insufficient documentation

## 2012-02-22 MED ORDER — HYDROCODONE-ACETAMINOPHEN 5-500 MG PO TABS: 1.0000 | ORAL_TABLET | Freq: Four times a day (QID) | ORAL | Status: AC | PRN

## 2012-02-22 NOTE — ED Provider Notes (Signed)
History     CSN: [id number]  Arrival date & time 02/22/12  0112   First MD Initiated Contact with Patient 02/22/12 0142      Chief Complaint  Patient presents with  . Toe Pain    (Consider location/radiation/quality/duration/timing/severity/associated sxs/prior treatment) HPI Comments: Dropped a freezer on her toe this afternoon while cleaning.  Patient is a 56 y.o. female presenting with toe pain. The history is provided by the patient.  Toe Pain This is a new problem. Episode onset: this afternoon. The problem occurs constantly. The problem has been gradually worsening. The symptoms are aggravated by walking and standing. Nothing relieves the symptoms. She has tried acetaminophen for the symptoms. The treatment provided no relief.    Past Medical History  Diagnosis Date  . Unspecified hypothyroidism   . Cough   . Acute bronchitis   . Obstructive sleep apnea (adult) (pediatric)   . Coronary atherosclerosis   . Type II or unspecified type diabetes mellitus without mention of complication, not stated as uncontrolled     Past Surgical History  Procedure Date  . Tubal ligation   . Anterior fusion cervical spine     Family History  Problem Relation Age of Onset  . Hypertension Mother   . Heart disease Mother   . Prostate cancer Father     History  Substance Use Topics  . Smoking status: Former Smoker -- 0.1 packs/day for 30 years    Quit date: 08/09/2003  . Smokeless tobacco: Never Used  . Alcohol Use: Not on file    OB History    Grav Para Term Preterm Abortions TAB SAB Ect Mult Living                  Review of Systems  All other systems reviewed and are negative.    Allergies  Codeine and Sulfonamide derivatives  Home Medications   Current Outpatient Rx  Name Route Sig Dispense Refill  . ADVAIR DISKUS 250-50 MCG/DOSE IN AEPB  INHALE ONE (1) PUFF(S) TWICE DAILY RINSE MOUTH 1 each 3  . ALBUTEROL SULFATE HFA 108 (90 BASE) MCG/ACT IN AERS Inhalation  Inhale 2 puffs into the lungs every 6 (six) hours as needed for wheezing or shortness of breath. For shortness of breath and wheezing 1 Inhaler prn  . ALBUTEROL SULFATE (2.5 MG/3ML) 0.083% IN NEBU Nebulization Take 3 mLs (2.5 mg total) by nebulization every 6 (six) hours as needed for wheezing or shortness of breath. 75 mL 12  . LIPITOR PO Oral Take 1 tablet by mouth daily.    Marland Kitchen CLOPIDOGREL BISULFATE 75 MG PO TABS Oral Take 75 mg by mouth daily.      Marland Kitchen DIAZEPAM 5 MG PO TABS Oral Take 0.5 tablets by mouth as needed. .5 to 1 tablet prn for muscle cramps    . DICLOFENAC SODIUM 75 MG PO TBEC Oral Take 75 mg by mouth daily as needed. For muscle aches    . ESOMEPRAZOLE MAGNESIUM 40 MG PO CPDR Oral Take 40 mg by mouth daily before breakfast.      . FERROUS SULFATE 325 (65 FE) MG PO TABS Oral Take 325 mg by mouth once a week.    . OMEGA-3 FATTY ACIDS 1000 MG PO CAPS Oral Take 2 g by mouth 2 (two) times daily.      . FUROSEMIDE 40 MG PO TABS Oral Take 40 mg by mouth daily as needed. For fluid retention    . ISOSORBIDE MONONITRATE ER 120 MG PO  TB24 Oral Take 120 mg by mouth daily.     Marland Kitchen LEVOTHYROXINE SODIUM 175 MCG PO TABS Oral Take 175 mcg by mouth daily.      Marland Kitchen LIRAGLUTIDE 18 MG/3ML [place] SOLN Subcutaneous Inject 1.2 mLs into the skin daily. Use as directed    . LOVAZA 1 G PO CAPS Oral Take 1 tablet by mouth BID times 48H.    Marland Kitchen LYRICA 75 MG PO CAPS Oral Take 1 tablet by mouth as needed.     Marland Kitchen METFORMIN HCL ER (MOD) 1000 MG PO TB24 Oral Take 1,000 mg by mouth 2 (two) times daily with a meal.      . METOPROLOL SUCCINATE ER 25 MG PO TB24 Oral Take 25 mg by mouth daily.      Marland Kitchen PROMETHAZINE HCL 25 MG PO TABS Oral Take 1 tablet by mouth as needed.    . TRIAMTERENE-HCTZ 37.5-25 MG PO CAPS Oral Take 1 capsule by mouth every morning.      Marland Kitchen VERAPAMIL HCL ER (CO) 240 MG PO TB24 Oral Take 240 mg by mouth daily.     . ACLIDINIUM BROMIDE 400 MCG/ACT IN AEPB Inhalation Inhale 1 puff into the lungs 2 (two) times daily. 1  each prn  . DOXYCYCLINE HYCLATE 100 MG PO TABS  Take 2 today then 1 daily until gone 8 tablet 0  . GUAIFENESIN ER 600 MG PO TB12 Oral Take 1,200 mg by mouth 2 (two) times daily.      Marland Kitchen HYDROCODONE-HOMATROPINE 5-1.5 MG/5ML PO SYRP Oral Take 5 mLs by mouth every 6 (six) hours as needed for cough. 200 mL 1  . MONTELUKAST SODIUM 10 MG PO TABS      . COMPRESSOR/NEBULIZER MISC Does not apply 1 Device by Does not apply route once. 1 each 0  . NOVOTWIST 32G X 5 MM MISC      . ONETOUCH ULTRA BLUE VI STRP      . VITAMIN B-12 1000 MCG PO TABS Oral Take 1,000 mcg by mouth daily.      BP 152/96  Pulse 79  Temp 97.5 F (36.4 C) (Oral)  Resp 18  Ht 5\' 2"  (1.575 m)  Wt 160 lb (72.576 kg)  BMI 29.26 kg/m2  SpO2 99%  Physical Exam  Nursing note and vitals reviewed. Constitutional: She is oriented to person, place, and time. She appears well-developed and well-nourished. No distress.  HENT:  Head: Normocephalic and atraumatic.  Neck: Normal range of motion. Neck supple.  Musculoskeletal:       The left great toe is significantly swollen and ecchymotic.  Neurological: She is alert and oriented to person, place, and time.  Skin: Skin is warm and dry. She is not diaphoretic.    ED Course  Procedures (including critical care time)  Labs Reviewed - No data to display Dg Foot Complete Left  02/22/2012  *RADIOLOGY REPORT*  Clinical Data: Pain, bruising, and swelling after injury.  LEFT FOOT - COMPLETE 3+ VIEW  Comparison: None.  Findings: Transverse fracture through the mid shaft of the distal phalanx of the first toe with fracture lines extending vertically to the phalangeal tuft.  No additional fractures demonstrated. Mild diffuse bone demineralization.  Degenerative changes in the first metatarsophalangeal joint.  IMPRESSION: Fracture of the distal phalanx of the first toe.  Original Report Authenticated By: Neale Burly, M.D.     No diagnosis found.    MDM  The xray reveals a fracture  through the middle of the distal phalanx  of the great toe.  She has a comfortable shoe she would like to continue wearing.  She wants something to help her sleep at night.        Veryl Speak, MD 02/22/12 [phone]

## 2012-02-22 NOTE — ED Notes (Signed)
Pt dropped freezer on left great toe this afternoon.

## 2012-02-22 NOTE — ED Notes (Signed)
Patient transported to X-ray 

## 2012-02-23 ENCOUNTER — Emergency Department (HOSPITAL_BASED_OUTPATIENT_CLINIC_OR_DEPARTMENT_OTHER)
Admission: EM | Admit: 2012-02-23 | Discharge: 2012-02-23 | Disposition: A | Discharge status: Home / Self Care | Payer: 59 | Attending: Emergency Medicine | Admitting: Emergency Medicine

## 2012-02-23 ENCOUNTER — Encounter (HOSPITAL_BASED_OUTPATIENT_CLINIC_OR_DEPARTMENT_OTHER): Payer: Self-pay | Admitting: *Deleted

## 2012-02-23 DIAGNOSIS — I2510 Atherosclerotic heart disease of native coronary artery without angina pectoris: Secondary | ICD-10-CM | POA: Insufficient documentation

## 2012-02-23 DIAGNOSIS — M79609 Pain in unspecified limb: Secondary | ICD-10-CM | POA: Insufficient documentation

## 2012-02-23 DIAGNOSIS — E119 Type 2 diabetes mellitus without complications: Secondary | ICD-10-CM | POA: Insufficient documentation

## 2012-02-23 DIAGNOSIS — G4733 Obstructive sleep apnea (adult) (pediatric): Secondary | ICD-10-CM | POA: Insufficient documentation

## 2012-02-23 DIAGNOSIS — M79675 Pain in left toe(s): Secondary | ICD-10-CM

## 2012-02-23 DIAGNOSIS — Z87891 Personal history of nicotine dependence: Secondary | ICD-10-CM | POA: Insufficient documentation

## 2012-02-23 DIAGNOSIS — Z79899 Other long term (current) drug therapy: Secondary | ICD-10-CM | POA: Insufficient documentation

## 2012-02-23 DIAGNOSIS — E039 Hypothyroidism, unspecified: Secondary | ICD-10-CM | POA: Insufficient documentation

## 2012-02-23 LAB — CBC WITH DIFFERENTIAL/PLATELET
Basophils Absolute: 0 10*3/uL (ref 0.0–0.1)
Basophils Relative: 0 % (ref 0–1)
Eosinophils Absolute: 0.3 10*3/uL (ref 0.0–0.7)
Hemoglobin: 11.5 g/dL — ABNORMAL LOW (ref 12.0–15.0)
MCH: 25.6 pg — ABNORMAL LOW (ref 26.0–34.0)
MCHC: 33.2 g/dL (ref 30.0–36.0)
Neutro Abs: 3.9 10*3/uL (ref 1.7–7.7)
Neutrophils Relative %: 46 % (ref 43–77)
Platelets: 256 10*3/uL (ref 150–400)

## 2012-02-23 LAB — GLUCOSE, CAPILLARY: Glucose-Capillary: 79 mg/dL (ref 70–99)

## 2012-02-23 NOTE — ED Provider Notes (Signed)
History     CSN: [id number]  Arrival date & time 02/23/12  50   First MD Initiated Contact with Patient 02/23/12 1145      Chief Complaint  Patient presents with  . Toe Pain    left    (Consider location/radiation/quality/duration/timing/severity/associated sxs/prior treatment) HPI  C/o left toe pain and swelling. Pt states she dropped freezer on her toe yesterday and was seen in ED. XR revealed fracture at that time. Has been taking her pain medication as prescribed, states that she has noticed worsening bruising and tracking of blood down foot, worsening blister. Has been keeping her LLE elevated. Denies new numbness/tingling/weakness (distal tip of toe numb at time of initial injury)   ED Notes, ED Provider Notes from 02/23/12 0000 to 02/23/12 11:20:08       Silvio Pate, RN 02/23/2012 11:17      Patient was seen yesterday for a injury to her left toe. Dropped a freezer on her foot. Xray showed left great toe fracture. Continues to have pain.      Past Medical History  Diagnosis Date  . Unspecified hypothyroidism   . Cough   . Acute bronchitis   . Obstructive sleep apnea (adult) (pediatric)   . Coronary atherosclerosis   . Type II or unspecified type diabetes mellitus without mention of complication, not stated as uncontrolled     Past Surgical History  Procedure Date  . Tubal ligation   . Anterior fusion cervical spine     Family History  Problem Relation Age of Onset  . Hypertension Mother   . Heart disease Mother   . Prostate cancer Father     History  Substance Use Topics  . Smoking status: Former Smoker -- 0.1 packs/day for 30 years    Quit date: 08/09/2003  . Smokeless tobacco: Never Used  . Alcohol Use: No    OB History    Grav Para Term Preterm Abortions TAB SAB Ect Mult Living                  Review of Systems  All other systems reviewed and are negative.  except as noted HPI   Allergies  Codeine and Sulfonamide  derivatives  Home Medications   Current Outpatient Rx  Name Route Sig Dispense Refill  . ACLIDINIUM BROMIDE 400 MCG/ACT IN AEPB Inhalation Inhale 1 puff into the lungs 2 (two) times daily. 1 each prn  . ADVAIR DISKUS 250-50 MCG/DOSE IN AEPB  INHALE ONE (1) PUFF(S) TWICE DAILY RINSE MOUTH 1 each 3  . ALBUTEROL SULFATE HFA 108 (90 BASE) MCG/ACT IN AERS Inhalation Inhale 2 puffs into the lungs every 6 (six) hours as needed for wheezing or shortness of breath. For shortness of breath and wheezing 1 Inhaler prn  . ALBUTEROL SULFATE (2.5 MG/3ML) 0.083% IN NEBU Nebulization Take 3 mLs (2.5 mg total) by nebulization every 6 (six) hours as needed for wheezing or shortness of breath. 75 mL 12  . LIPITOR PO Oral Take 1 tablet by mouth daily.    Marland Kitchen CLOPIDOGREL BISULFATE 75 MG PO TABS Oral Take 75 mg by mouth daily.      Marland Kitchen DIAZEPAM 5 MG PO TABS Oral Take 0.5 tablets by mouth as needed. .5 to 1 tablet prn for muscle cramps    . DICLOFENAC SODIUM 75 MG PO TBEC Oral Take 75 mg by mouth daily as needed. For muscle aches    . DOXYCYCLINE HYCLATE 100 MG PO TABS  Take 2 today then  1 daily until gone 8 tablet 0  . ESOMEPRAZOLE MAGNESIUM 40 MG PO CPDR Oral Take 40 mg by mouth daily before breakfast.      . FERROUS SULFATE 325 (65 FE) MG PO TABS Oral Take 325 mg by mouth once a week.    . OMEGA-3 FATTY ACIDS 1000 MG PO CAPS Oral Take 2 g by mouth 2 (two) times daily.      . FUROSEMIDE 40 MG PO TABS Oral Take 40 mg by mouth daily as needed. For fluid retention    . GUAIFENESIN ER 600 MG PO TB12 Oral Take 1,200 mg by mouth 2 (two) times daily.      Marland Kitchen HYDROCODONE-ACETAMINOPHEN 5-500 MG PO TABS Oral Take 1-2 tablets by mouth every 6 (six) hours as needed for pain. 20 tablet 0  . HYDROCODONE-HOMATROPINE 5-1.5 MG/5ML PO SYRP Oral Take 5 mLs by mouth every 6 (six) hours as needed for cough. 200 mL 1  . ISOSORBIDE MONONITRATE ER 120 MG PO TB24 Oral Take 120 mg by mouth daily.     Marland Kitchen LEVOTHYROXINE SODIUM 175 MCG PO TABS Oral  Take 175 mcg by mouth daily.      Marland Kitchen LIRAGLUTIDE 18 MG/3ML [place] SOLN Subcutaneous Inject 1.2 mLs into the skin daily. Use as directed    . LOVAZA 1 G PO CAPS Oral Take 1 tablet by mouth BID times 48H.    Marland Kitchen LYRICA 75 MG PO CAPS Oral Take 1 tablet by mouth as needed.     Marland Kitchen METFORMIN HCL ER (MOD) 1000 MG PO TB24 Oral Take 1,000 mg by mouth 2 (two) times daily with a meal.      . METOPROLOL SUCCINATE ER 25 MG PO TB24 Oral Take 25 mg by mouth daily.      Marland Kitchen MONTELUKAST SODIUM 10 MG PO TABS      . COMPRESSOR/NEBULIZER MISC Does not apply 1 Device by Does not apply route once. 1 each 0  . NOVOTWIST 32G X 5 MM MISC      . ONETOUCH ULTRA BLUE VI STRP      . PROMETHAZINE HCL 25 MG PO TABS Oral Take 1 tablet by mouth as needed.    . TRIAMTERENE-HCTZ 37.5-25 MG PO CAPS Oral Take 1 capsule by mouth every morning.      Marland Kitchen VERAPAMIL HCL ER (CO) 240 MG PO TB24 Oral Take 240 mg by mouth daily.     Marland Kitchen VITAMIN B-12 1000 MCG PO TABS Oral Take 1,000 mcg by mouth daily.      BP 139/68  Pulse 68  Temp 98.7 F (37.1 C) (Oral)  Resp 20  SpO2 100%  Physical Exam  Nursing note and vitals reviewed. Constitutional: She is oriented to person, place, and time. She appears well-developed.  HENT:  Head: Atraumatic.  Mouth/Throat: Oropharynx is clear and moist.  Eyes: Conjunctivae and EOM are normal. Pupils are equal, round, and reactive to light.  Neck: Normal range of motion. Neck supple.  Cardiovascular: Normal rate, regular rhythm, normal heart sounds and intact distal pulses.   Pulmonary/Chest: Effort normal and breath sounds normal. No respiratory distress. She has no wheezes. She has no rales.  Abdominal: Soft. She exhibits no distension. There is no tenderness. There is no rebound and no guarding.  Musculoskeletal: Normal range of motion. She exhibits edema and tenderness.       Feet:       Left great toe- diffuse swelling and ecchymosis. Gross sensation dec but intact Limited ROM 2/2 pain  Cap refill < 3  sec Blood filled blister without drainage Min warmth, no crepitus  Neurological: She is alert and oriented to person, place, and time.  Skin: Skin is warm and dry. No rash noted.  Psychiatric: She has a normal mood and affect.    ED Course  Procedures (including critical care time)  Labs Reviewed  CBC WITH DIFFERENTIAL - Abnormal; Notable for the following:    Hemoglobin 11.5 (*)     HCT 34.6 (*)     MCV 77.1 (*)     MCH 25.6 (*)     RDW 16.0 (*)     All other components within normal limits  GLUCOSE, CAPILLARY   Dg Foot Complete Left  02/22/2012  *RADIOLOGY REPORT*  Clinical Data: Pain, bruising, and swelling after injury.  LEFT FOOT - COMPLETE 3+ VIEW  Comparison: None.  Findings: Transverse fracture through the mid shaft of the distal phalanx of the first toe with fracture lines extending vertically to the phalangeal tuft.  No additional fractures demonstrated. Mild diffuse bone demineralization.  Degenerative changes in the first metatarsophalangeal joint.  IMPRESSION: Fracture of the distal phalanx of the first toe.  Original Report Authenticated By: Neale Burly, M.D.    1. Toe pain, left     MDM  Closed fracture left great toe with worsening ecchymosis. There is quite a bit of swelling. I do not suspect compartment syndrome. Low likelihood cellulitis given onset and closed fx. The blister appears to be sterile and is likely related to the trauma. Discussed with pt that I will not drain it today and she is ok with that due to risk of introducing infection (she is diabetic). Her WBC and glu are nl today. She will continue to take her pain medication at home and f/u with PMD for wound check. Doubt [facility] precluding discharge at this time. Given Precautions for return. PMD f/u.         Blair Heys, MD 02/23/12 1356

## 2012-02-23 NOTE — ED Notes (Addendum)
Post discharge, patient requested to make sure no antibiotic was ordered.  Reviewed with Dr. Justin Mend, due to [facility] normal, no discharge antibiotic ordered.

## 2012-02-23 NOTE — ED Notes (Signed)
Patient was seen yesterday for a injury to her left toe.  Dropped a freezer on her foot.  Xray showed left great toe fracture.  Continues to have pain.

## 2012-03-07 ENCOUNTER — Telehealth: Payer: Self-pay | Admitting: Internal Medicine

## 2012-03-07 MED ORDER — AZITHROMYCIN 250 MG PO TABS: ORAL_TABLET | ORAL | Status: DC

## 2012-03-07 NOTE — Telephone Encounter (Signed)
Per CY okay to give Zpak #1 take as directed no refills. 

## 2012-03-07 NOTE — Telephone Encounter (Signed)
Pt aware of cdy recs. rx has been sent to the phramacy

## 2012-03-26 ENCOUNTER — Other Ambulatory Visit: Payer: Self-pay | Admitting: Internal Medicine

## 2012-03-27 NOTE — Telephone Encounter (Signed)
Please advise if okay to refill as requested. Thanks.  

## 2012-03-27 NOTE — Telephone Encounter (Signed)
Ok to refill 

## 2012-05-09 ENCOUNTER — Telehealth: Payer: Self-pay | Admitting: Internal Medicine

## 2012-05-09 ENCOUNTER — Ambulatory Visit: Payer: 59 | Admitting: Internal Medicine

## 2012-05-09 NOTE — Telephone Encounter (Signed)
Per CY-okay for patient to come in and see TP Friday as he is booked. Thanks.

## 2012-05-09 NOTE — Telephone Encounter (Signed)
Called, spoke with [name].  We have scheduled her to see TP on Friday, Oct 4 at 10:15 am -- [name] aware and ok with this.  Peter Congo with Dr. Baldwin Crown office also aware.

## 2012-05-09 NOTE — Telephone Encounter (Signed)
Called, spoke with [name] with Dr. Forde Dandy.  States pt was seen by Dr. Forde Dandy today and was "really, really SOB."  [name] states you could see pt having difficult time trying to get her breath.  Reports there is mention of pt having cough and wheezing in the note.  Unsure of f/c/s, chest tightness, or chest pain.  Unsure what Dr. Forde Dandy gave/done for pt today as note is still not complete.  Requesting appt ASAP with Dr. Annamaria Boots but pt is only able to come in on Monday or Fridays from 9:30 am - 1 pm.  Advised will send msg to Dr. Annamaria Boots and Joellen Jersey to see if pt can be worked in.  [name] will fax Dr. Baldwin Crown notes to triage attn: Joellen Jersey once completed so CD[facility] will know what was done today to advise on any further recs prior to  appt.  Dr. Joni Reining, pls advise.  Thank you.  Called, spoke with pt to get more information per [facility] request.  Reports symptoms started in Aug when school started with a sore throat.  Now having wheezing, hoarse, rattling in chest, DOE, nasal congestion and prod cough both with light green mucus.  No fever now.  Has finished prednisone and abx for these symptoms.  Dr. Forde Dandy did not give any meds today as he wants her to see Dr. Annamaria Boots to advise.  Requesting we leave a detailed msg when calling back if no answer.    Dr. Annamaria Boots, pls advise.  Thank you.

## 2012-05-11 ENCOUNTER — Ambulatory Visit (INDEPENDENT_AMBULATORY_CARE_PROVIDER_SITE_OTHER)
Admission: RE | Admit: 2012-05-11 | Discharge: 2012-05-11 | Disposition: A | Discharge status: Home / Self Care | Payer: 59 | Source: Ambulatory Visit | Attending: Adult Health | Admitting: Adult Health

## 2012-05-11 ENCOUNTER — Ambulatory Visit (INDEPENDENT_AMBULATORY_CARE_PROVIDER_SITE_OTHER): Payer: 59 | Admitting: Adult Health

## 2012-05-11 ENCOUNTER — Encounter: Payer: Self-pay | Admitting: Adult Health

## 2012-05-11 VITALS — BP 140/80 | HR 86 | Temp 97.2°F | Ht 62.0 in | Wt 157.4 lb

## 2012-05-11 DIAGNOSIS — R059 Cough, unspecified: Secondary | ICD-10-CM

## 2012-05-11 DIAGNOSIS — R05 Cough: Secondary | ICD-10-CM

## 2012-05-11 DIAGNOSIS — J209 Acute bronchitis, unspecified: Secondary | ICD-10-CM

## 2012-05-11 MED ORDER — HYDROCODONE-HOMATROPINE 5-1.5 MG/5ML PO SYRP: 5.0000 mL | ORAL_SOLUTION | Freq: Four times a day (QID) | ORAL | Status: DC | PRN

## 2012-05-11 MED ORDER — AMOXICILLIN-POT CLAVULANATE 875-125 MG PO TABS: 1.0000 | ORAL_TABLET | Freq: Two times a day (BID) | ORAL | Status: AC

## 2012-05-11 MED ORDER — PREDNISONE 10 MG PO TABS: ORAL_TABLET | ORAL | Status: DC

## 2012-05-11 NOTE — Progress Notes (Signed)
Quick Note:  LMOM TCB x1. ______ 

## 2012-05-11 NOTE — Progress Notes (Signed)
Patient ID: [name], female    DOB: [birth year], 56 y.o.   MRN: [id number]  HPI 11/08/10- 59 yo F former smoker, followed here for obstructive sleep apnea and for hx bronchitis.  She was recently treated by her PCP for acute bronchitis. Onset after mowing grass. Still coughing up thick greenish yellow mucus after augmentin they gave her. She thinks she does better with doxycycline. Denies fever, swollen glands, sore throat.  Last seen here May 10, 2010. Hx of allergy testing but never on vaccine. Pollen is bothering with nasal congestion and wheezey cough more this year.  Continues CPAP at 13 cwp all night every night.  Sleeps ok. She asks about alternatives and we discussed palatal surgeries, oral appliances and Provent nasal valves. [name] Twin sister had MI.  05/10/11- 62 yo F former smoker, followed here for obstructive sleep apnea and for hx bronchitis, allergic rhinitis, complicated by DM, hypothyroid, CAD  Since last here, Dr. Claiborne Billings placed a coronary stent. 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  CPAP compliance is good all night every night. She isn't sure about mask and we discussed going [facility].  Wants flu vac. She still feels tight in the chest that she continues Advair 250. Some wheeze. Uses rescue inhaler and asks about getting a nebulizer machine. We had called in a Z-Pak which she says was not strong enough. She denies discolored sputum or fever. We discussed using cortisone and anticipated impact on her insulin-dependent diabetes temporarily. She complains of a "raw" substernal sensation but thinks Nexium controls her reflux well.  08/29/11-  38 yo F former smoker, followed here for obstructive sleep apnea and for hx bronchitis, allergic rhinitis, complicated by DM, hypothyroid, CAD  Acute visit-  deep cough-yellow in color and streaks of blood at times; SOB and wheezing-went to ER Saturday([facility])Lower  back pain  she did have flu vaccine. Last week had GI upset with vomiting and right upper quadrant pain. She still has her gallbladder. That discomfort has eased off. 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 As her GI upset resolved, she developed chest tightness with cough and cold symptoms, fever and chills 3 days ago now resolved. Head and chest are still congested. [facility] his nebulizer treatment x2 started prednisone taper. She  Is finishing doxycycline with 2 days left. CHEST - 2 VIEW 08/27/11-  Comparison: 05/10/2011  Findings: Mild peribronchial thickening. Heart and mediastinal  contours are within normal limits. No focal opacities or  effusions. No acute bony abnormality.  IMPRESSION:  Mild bronchitic changes.  Original Report Authenticated By: [name], M.D.   11/08/11- 48 yo F former smoker, followed here for obstructive sleep apnea and for hx bronchitis, allergic rhinitis, complicated by DM, hypothyroid, CAD  Continues using CPAP 13 almost every night. No sleeping pills. Nebulizer treatment and Depo-Medrol did help her breathing last visit. She would like a home nebulizer which we discussed. Singulair did not help. Blames her cough on postnasal drip or getting too hot. Has history of occasional thrush which might also contribute to throat discomfort.   05/11/2012 Acute OV  Complains of head congestion w/ green drainage, PND, prod cough with gree mucus, hoarseness, increased SOB, wheezing, some tightness x2 weeks - denies f/c/s.  has finished abx and pred thru PCP w/in last 2 weeks Took keflex and steroid taper with minimal  improvement. Has sinus congestion, ear fullness, hoarseness and drainage.  No hemotpysis or chest pain  No edema.  OTC not helping with cough.     Review of Systems-See HPI Constitutional:   No  weight loss, night sweats,  Fevers, chills, +fatigue, or  lassitude.  HEENT:   No headaches,   Difficulty swallowing,  Tooth/dental problems, or  Sore throat,                No sneezing, itching, ear ache,  +nasal congestion, post nasal drip,   CV:  No chest pain,  Orthopnea, PND, swelling in lower extremities, anasarca, dizziness, palpitations, syncope.   GI  No heartburn, indigestion, abdominal pain, nausea, vomiting, diarrhea, change in bowel habits, loss of appetite, bloody stools.   Resp:    No coughing up of blood.  No change in color of mucus.  No wheezing.  No chest wall deformity  Skin: no rash or lesions.  GU: no dysuria, change in color of urine, no urgency or frequency.  No flank pain, no hematuria   MS:  No joint pain or swelling.  No decreased range of motion.  No back pain.  Psych:  No change in mood or affect. No depression or anxiety.  No memory loss.          Objective:   Physical Exam GEN: A/Ox3; pleasant , NAD  HEENT:  [place]/AT,  EACs-clear, TMs-wnl, NOSE-clear draiange , max tenderness , THROAT-clear, no lesions, no postnasal drip or exudate noted.   NECK:  Supple w/ fair ROM; no JVD; normal carotid impulses w/o bruits; no thyromegaly or nodules palpated; no lymphadenopathy.  RESP  Coarse BS  w/o, wheezes/ rales/ or rhonchi.no accessory muscle use, no dullness to percussion  CARD:  RRR, no m/r/g  , no peripheral edema, pulses intact, no cyanosis or clubbing.  GI:   Soft & nt; nml bowel sounds; no organomegaly or masses detected.  Musco: Warm bil, no deformities or joint swelling noted.   Neuro: alert, no focal deficits noted.    Skin: Warm, no lesions or rashes

## 2012-05-11 NOTE — Assessment & Plan Note (Signed)
AEAB complicated by sinusitis -slow to resolve  cxr today   Plan  Augmentin 875mg  Twice daily  For 10 days -take with food.  Mucinex DM Twice daily  As needed  Cough/congestion  Saline nasal rinses As needed   Prednisone taper over next week.  Hydromet 1-2 tsp every 4-6 hr As needed  Cough-may make you sleepy.  follow up Dr. Annamaria Boots  In 4 weeks  Please contact office for sooner follow up if symptoms do not improve or worsen or seek emergency care  I will call with xray results.

## 2012-05-11 NOTE — Patient Instructions (Addendum)
Augmentin 875mg  Twice daily  For 10 days -take with food.  Mucinex DM Twice daily  As needed  Cough/congestion  Saline nasal rinses As needed   Prednisone taper over next week.  Hydromet 1-2 tsp every 4-6 hr As needed  Cough-may make you sleepy.  follow up Dr. Annamaria Boots  In 4 weeks  Please contact office for sooner follow up if symptoms do not improve or worsen or seek emergency care  I will call with xray results.

## 2012-05-11 NOTE — Progress Notes (Signed)
Quick Note:  Patient returned call. Advised of lab results / recs as stated by TP. Pt verbalized understanding and denied any questions. ______ 

## 2012-05-21 ENCOUNTER — Telehealth: Payer: Self-pay | Admitting: Internal Medicine

## 2012-05-21 DIAGNOSIS — G4733 Obstructive sleep apnea (adult) (pediatric): Secondary | ICD-10-CM

## 2012-05-21 NOTE — Telephone Encounter (Signed)
CY, please advise. Thanks!  

## 2012-05-21 NOTE — Telephone Encounter (Signed)
Spoke with patient-states she read about the product in a health magazine. CY, is there anything else pt should know about product prior to placing order for her to try it? Thanks.

## 2012-05-21 NOTE — Telephone Encounter (Signed)
She can certainly try Provent. Has she been talking with someone about it?

## 2012-05-22 MED ORDER — PROVENT SLEEP APNEA THERAPY MISC: 1.0000 | Freq: Every day | Status: DC

## 2012-05-22 NOTE — Telephone Encounter (Signed)
Per CY-okay to place order to let patient have Provent-gave Rx and order to [facility].

## 2012-06-25 ENCOUNTER — Ambulatory Visit: Payer: 59 | Admitting: Internal Medicine

## 2012-06-29 ENCOUNTER — Encounter: Payer: Self-pay | Admitting: Internal Medicine

## 2012-06-29 ENCOUNTER — Ambulatory Visit (INDEPENDENT_AMBULATORY_CARE_PROVIDER_SITE_OTHER): Payer: 59 | Admitting: Internal Medicine

## 2012-06-29 VITALS — BP 140/80 | HR 81 | Ht 62.0 in | Wt 162.4 lb

## 2012-06-29 DIAGNOSIS — R05 Cough: Secondary | ICD-10-CM

## 2012-06-29 DIAGNOSIS — J209 Acute bronchitis, unspecified: Secondary | ICD-10-CM

## 2012-06-29 DIAGNOSIS — J45909 Unspecified asthma, uncomplicated: Secondary | ICD-10-CM

## 2012-06-29 DIAGNOSIS — R059 Cough, unspecified: Secondary | ICD-10-CM

## 2012-06-29 DIAGNOSIS — G4733 Obstructive sleep apnea (adult) (pediatric): Secondary | ICD-10-CM

## 2012-06-29 MED ORDER — LEVALBUTEROL HCL 0.63 MG/3ML IN NEBU
0.6300 mg | INHALATION_SOLUTION | Freq: Once | RESPIRATORY_TRACT | Status: AC
  Administered 2012-06-29: 0.63 mg via RESPIRATORY_TRACT

## 2012-06-29 MED ORDER — HYDROCODONE-HOMATROPINE 5-1.5 MG/5ML PO SYRP: 5.0000 mL | ORAL_SOLUTION | Freq: Four times a day (QID) | ORAL | Status: AC | PRN

## 2012-06-29 MED ORDER — METHYLPREDNISOLONE ACETATE 80 MG/ML IJ SUSP
80.0000 mg | Freq: Once | INTRAMUSCULAR | Status: AC
  Administered 2012-06-29: 80 mg via INTRAMUSCULAR

## 2012-06-29 NOTE — Progress Notes (Signed)
Patient ID: [name], female    DOB: [birth year], 56 y.o.   MRN: [id number]  HPI 11/08/10- 59 yo F former smoker, followed here for obstructive sleep apnea and for hx bronchitis.  She was recently treated by her PCP for acute bronchitis. Onset after mowing grass. Still coughing up thick greenish yellow mucus after augmentin they gave her. She thinks she does better with doxycycline. Denies fever, swollen glands, sore throat.  Last seen here May 10, 2010. Hx of allergy testing but never on vaccine. Pollen is bothering with nasal congestion and wheezey cough more this year.  Continues CPAP at 13 cwp all night every night.  Sleeps ok. She asks about alternatives and we discussed palatal surgeries, oral appliances and Provent nasal valves. [name] Twin sister had MI.  05/10/11- 62 yo F former smoker, followed here for obstructive sleep apnea and for hx bronchitis, allergic rhinitis, complicated by DM, hypothyroid, CAD  Since last here, Dr. Claiborne Billings placed a coronary stent. 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  CPAP compliance is good all night every night. She isn't sure about mask and we discussed going to [facility] to look at alternatives.  Wants flu vac. She still feels tight in the chest that she continues Advair 250. Some wheeze. Uses rescue inhaler and asks about getting a nebulizer machine. We had called in a Z-Pak which she says was not strong enough. She denies discolored sputum or fever. We discussed using cortisone and anticipated impact on her insulin-dependent diabetes temporarily. She complains of a "raw" substernal sensation but thinks Nexium controls her reflux well.  08/29/11-  38 yo F former smoker, followed here for obstructive sleep apnea and for hx bronchitis, allergic rhinitis, complicated by DM, hypothyroid, CAD  Acute visit-  deep cough-yellow in color and streaks of blood at times; SOB and wheezing-went to ER Saturday([name])Lower  back pain  she did have flu vaccine. Last week had GI upset with vomiting and right upper quadrant pain. She still has her gallbladder. That discomfort has eased off. 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 As her GI upset resolved, she developed chest tightness with cough and cold symptoms, fever and chills 3 days ago now resolved. Head and chest are still congested. [name] his nebulizer treatment x2 started prednisone taper. She  Is finishing doxycycline with 2 days left. CHEST - 2 VIEW 08/27/11-  Comparison: 05/10/2011  Findings: Mild peribronchial thickening. Heart and mediastinal  contours are within normal limits. No focal opacities or  effusions. No acute bony abnormality.  IMPRESSION:  Mild bronchitic changes.  Original Report Authenticated By: [name], M.D.   11/08/11- 48 yo F former smoker, followed here for obstructive sleep apnea and for hx bronchitis, allergic rhinitis, complicated by DM, hypothyroid, CAD  Continues using CPAP 13 almost every night. No sleeping pills. Nebulizer treatment and Depo-Medrol did help her breathing last visit. She would like a home nebulizer which we discussed. Singulair did not help. Blames her cough on postnasal drip or getting too hot. Has history of occasional thrush which might also contribute to throat discomfort.   05/11/2012 Acute OV  Complains of head congestion w/ green drainage, PND, prod cough with gree mucus, hoarseness, increased SOB, wheezing, some tightness x2 weeks - denies f/c/s.  has finished abx and pred thru PCP w/in last 2 weeks Took keflex and steroid taper with minimal  improvement. Has sinus congestion, ear fullness, hoarseness and drainage.  No hemotpysis or chest pain  No edema.  OTC not helping with cough.   06/29/12- 73 yo F former smoker, followed here for obstructive sleep apnea and for hx bronchitis, allergic rhinitis, complicated by DM, hypothyroid,  CAD  FOLLOWS FOR: still having cough and feels fullness in chest coming back since seeign TP.; discomfort on right side lung area CPAP 13 Feeling better after she was seen by the nurse practitioner October 4. Now has caught a new cold. Sneezing. Hard cough caused sharp pain right lateral rib. Area still hurts to cough. Scant phlegm, mild sore throat, no fever but did have some chills originally. Took Augmentin then amoxicillin used for dental prophylaxis. CXR 05/11/12-reviewed IMPRESSION:  No acute cardiopulmonary abnormality.  Original Report Authenticated By: [name], M.D.   ROS-see HPI Constitutional:   No-   weight loss, night sweats, fevers, +chills, fatigue, lassitude. HEENT:   No-  headaches, difficulty swallowing, tooth/dental problems, sore throat,       No-  sneezing, itching, ear ache, nasal congestion, post nasal drip,  CV:  + Atypical chest pain, no-orthopnea, PND, swelling in lower extremities, anasarca,  dizziness, palpitations Resp: No-   shortness of breath with exertion or at rest.              +  productive cough,  + non-productive cough,  No- coughing up of blood.              No-   change in color of mucus.  No- wheezing.   Skin: No-   rash or lesions. GI:  No-   heartburn, indigestion, abdominal pain, nausea, vomiting,  GU: . MS:  No-   joint pain or swelling.  . Neuro-     nothing unusual Psych:  No- change in mood or affect. No depression or anxiety.  No memory loss.  Objective:   Physical Exam OBJ- Physical Exam General- Alert, Oriented, Affect-appropriate, Distress- none acute Skin- rash-none, lesions- none, excoriation- none Lymphadenopathy- none Head- atraumatic            Eyes- Gross vision intact, PERRLA, conjunctivae and secretions clear            Ears- Hearing, canals-normal            Nose- Clear, no-Septal dev, mucus, polyps, erosion, perforation             Throat- Mallampati II , mucosa clear , drainage- none, tonsils- atrophic Neck-  flexible , trachea midline, no stridor , thyroid nl, carotid no bruit Chest - symmetrical excursion , unlabored           Heart/CV- RRR , no murmur , no gallop  , no rub, nl s1 s2                           - JVD- none , edema- none, stasis changes- none, varices- none           Lung- clear to P&A, +wheeze-, cough- none , dullness-none, rub- none           Chest wall- tender right lower anterior rib under breast without rub or crepitus Abd-  Br/ Gen/ Rectal- Not done, not indicated Extrem- cyanosis- none, clubbing, none, atrophy- none, strength- nl Neuro- grossly intact to observation

## 2012-06-29 NOTE — Patient Instructions (Addendum)
Neb Xop  0.63  Depo 80  Refill script for cough syrup

## 2012-07-14 NOTE — Assessment & Plan Note (Signed)
She remains compliant with CPAP 

## 2012-07-14 NOTE — Assessment & Plan Note (Signed)
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

## 2012-07-14 NOTE — Assessment & Plan Note (Signed)
This acute exacerbation is likely viral. We discussed symptom management. Plan-nebulizer treatment and Depo-Medrol.

## 2012-07-30 ENCOUNTER — Telehealth: Payer: Self-pay | Admitting: Internal Medicine

## 2012-07-30 MED ORDER — DOXYCYCLINE HYCLATE 50 MG PO CAPS: ORAL_CAPSULE | ORAL | Status: DC

## 2012-07-30 MED ORDER — HYDROCOD POLST-CHLORPHEN POLST 10-8 MG/5ML PO LQCR: 5.0000 mL | Freq: Every evening | ORAL | Status: DC | PRN

## 2012-07-30 NOTE — Telephone Encounter (Signed)
Per CY-okay to give Doxycycline 100 mg #8 take 2 today then 1 daily no refills; Mucinex DM, and okay to refill her Tussionex if needed.

## 2012-07-30 NOTE — Telephone Encounter (Signed)
Spoke with pt and advised of CYs' recs rx's called to pharmacy  And nothing further needed

## 2012-07-30 NOTE — Telephone Encounter (Signed)
I spoke with pt and she c/o cough w/ green phlem, chest tx, wheezing, slight nasal congestion, and very hoarse x Friday. No f/c/s/n/v/PND. She has been taking different OTC cough syrups and mucinex w/o relief. She is requesting to have something called in. Please advise Dr. Annamaria Boots thanks Last OV 06/29/12 Pending OV 12/28/12  Allergies  Allergen Reactions  . Codeine     REACTION: hallucinations/"loopy"  . Sulfonamide Derivatives Hives

## 2012-10-04 ENCOUNTER — Telehealth: Payer: Self-pay | Admitting: Internal Medicine

## 2012-10-04 ENCOUNTER — Other Ambulatory Visit: Payer: Self-pay | Admitting: *Deleted

## 2012-10-04 MED ORDER — FLUTICASONE-SALMETEROL 250-50 MCG/DOSE IN AEPB: INHALATION_SPRAY | RESPIRATORY_TRACT | Status: DC

## 2012-10-04 NOTE — Telephone Encounter (Signed)
Rx has been sent in, pt is aware. 

## 2012-10-05 ENCOUNTER — Other Ambulatory Visit: Payer: Self-pay | Admitting: Endocrinology

## 2012-10-05 DIAGNOSIS — E041 Nontoxic single thyroid nodule: Secondary | ICD-10-CM

## 2012-10-11 ENCOUNTER — Ambulatory Visit
Admission: RE | Admit: 2012-10-11 | Discharge: 2012-10-11 | Disposition: A | Discharge status: Home / Self Care | Payer: 59 | Source: Ambulatory Visit | Attending: Endocrinology | Admitting: Endocrinology

## 2012-10-11 DIAGNOSIS — E041 Nontoxic single thyroid nodule: Secondary | ICD-10-CM

## 2012-10-18 ENCOUNTER — Other Ambulatory Visit: Payer: Self-pay | Admitting: Gastroenterology

## 2012-10-18 ENCOUNTER — Telehealth: Payer: Self-pay | Admitting: Internal Medicine

## 2012-10-18 DIAGNOSIS — R112 Nausea with vomiting, unspecified: Secondary | ICD-10-CM

## 2012-10-18 DIAGNOSIS — R109 Unspecified abdominal pain: Secondary | ICD-10-CM

## 2012-10-18 MED ORDER — AMOXICILLIN 500 MG PO CAPS: 500.0000 mg | ORAL_CAPSULE | Freq: Three times a day (TID) | ORAL | Status: DC

## 2012-10-18 MED ORDER — NYSTATIN 100000 UNIT/ML MT SUSP: 500000.0000 [IU] | Freq: Four times a day (QID) | OROMUCOSAL | Status: DC

## 2012-10-18 NOTE — Telephone Encounter (Signed)
Pt states that she is having sore throat, itchy, and white patches in back of throat; hoarseness, and sinus drainage; started about 3 days ago.   Allergies  Allergen Reactions  . Codeine     REACTION: hallucinations/"loopy"  . Sulfonamide Derivatives Hives   Please advise.

## 2012-10-18 NOTE — Telephone Encounter (Signed)
Pt returned triage's call.  Holly D Pryor ° °

## 2012-10-18 NOTE — Telephone Encounter (Signed)
Per CY: Amox 500mg  #21 Take 1 tid Nystatin oral susp  100,000 unit ML take 30ml po qid x 7days #119ml  Patient aware meds sent to pharm. [employer].

## 2012-10-18 NOTE — Telephone Encounter (Signed)
lmomtcb  

## 2012-10-18 NOTE — Telephone Encounter (Signed)
[name]

## 2012-10-19 ENCOUNTER — Other Ambulatory Visit: Payer: Self-pay | Admitting: Gastroenterology

## 2012-10-19 DIAGNOSIS — R109 Unspecified abdominal pain: Secondary | ICD-10-CM

## 2012-10-19 DIAGNOSIS — R112 Nausea with vomiting, unspecified: Secondary | ICD-10-CM

## 2012-11-05 ENCOUNTER — Telehealth: Payer: Self-pay | Admitting: Internal Medicine

## 2012-11-05 NOTE — Telephone Encounter (Signed)
Pt c/o chest congestion, wheezing, sinus congestion, cough, sore throat and hoarseness and is requesting an appt with CY tomorrow. Per Joellen Jersey ok to offer 11:15 tomorrow. Pt advised and appt set. [place] Bing, CMA

## 2012-11-06 ENCOUNTER — Encounter: Payer: Self-pay | Admitting: Internal Medicine

## 2012-11-06 ENCOUNTER — Ambulatory Visit (INDEPENDENT_AMBULATORY_CARE_PROVIDER_SITE_OTHER): Payer: 59 | Admitting: Internal Medicine

## 2012-11-06 VITALS — BP 122/76 | HR 90 | Temp 99.7°F | Ht 62.0 in | Wt 168.4 lb

## 2012-11-06 DIAGNOSIS — J019 Acute sinusitis, unspecified: Secondary | ICD-10-CM

## 2012-11-06 DIAGNOSIS — J309 Allergic rhinitis, unspecified: Secondary | ICD-10-CM

## 2012-11-06 DIAGNOSIS — J302 Other seasonal allergic rhinitis: Secondary | ICD-10-CM

## 2012-11-06 DIAGNOSIS — J209 Acute bronchitis, unspecified: Secondary | ICD-10-CM

## 2012-11-06 MED ORDER — HYDROCOD POLST-CHLORPHEN POLST 10-8 MG/5ML PO LQCR: 5.0000 mL | Freq: Two times a day (BID) | ORAL | Status: DC | PRN

## 2012-11-06 MED ORDER — PHENYLEPHRINE HCL 1 % NA SOLN
3.0000 [drp] | Freq: Once | NASAL | Status: AC
  Administered 2012-11-06: 3 [drp] via NASAL

## 2012-11-06 MED ORDER — METHYLPREDNISOLONE ACETATE 80 MG/ML IJ SUSP
80.0000 mg | Freq: Once | INTRAMUSCULAR | Status: AC
  Administered 2012-11-06: 80 mg via INTRAMUSCULAR

## 2012-11-06 MED ORDER — CEFDINIR 300 MG PO CAPS: 300.0000 mg | ORAL_CAPSULE | Freq: Two times a day (BID) | ORAL | Status: DC

## 2012-11-06 NOTE — Progress Notes (Signed)
Patient ID: [name], female    DOB: [birth year], 57 y.o.   MRN: [id number]  HPI 11/08/10- 59 yo F former smoker, followed here for obstructive sleep apnea and for hx bronchitis.  She was recently treated by her PCP for acute bronchitis. Onset after mowing grass. Still coughing up thick greenish yellow mucus after augmentin they gave her. She thinks she does better with doxycycline. Denies fever, swollen glands, sore throat.  Last seen here May 10, 2010. Hx of allergy testing but never on vaccine. Pollen is bothering with nasal congestion and wheezey cough more this year.  Continues CPAP at 13 cwp all night every night.  Sleeps ok. She asks about alternatives and we discussed palatal surgeries, oral appliances and Provent nasal valves. [name] Twin sister had MI.  05/10/11- 62 yo F former smoker, followed here for obstructive sleep apnea and for hx bronchitis, allergic rhinitis, complicated by DM, hypothyroid, CAD  Since last here, Dr. Claiborne Billings placed a coronary stent. 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  CPAP compliance is good all night every night. She isn't sure about mask and we discussed going to [facility] to look at alternatives.  Wants flu vac. She still feels tight in the chest that she continues Advair 250. Some wheeze. Uses rescue inhaler and asks about getting a nebulizer machine. We had called in a Z-Pak which she says was not strong enough. She denies discolored sputum or fever. We discussed using cortisone and anticipated impact on her insulin-dependent diabetes temporarily. She complains of a "raw" substernal sensation but thinks Nexium controls her reflux well.  08/29/11-  38 yo F former smoker, followed here for obstructive sleep apnea and for hx bronchitis, allergic rhinitis, complicated by DM, hypothyroid, CAD  Acute visit-  deep cough-yellow in color and streaks of blood at times; SOB and wheezing-went to ER Saturday([facility])Lower  back pain  she did have flu vaccine. Last week had GI upset with vomiting and right upper quadrant pain. She still has her gallbladder. That discomfort has eased off. 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 As her GI upset resolved, she developed chest tightness with cough and cold symptoms, fever and chills 3 days ago now resolved. Head and chest are still congested. [facility] his nebulizer treatment x2 started prednisone taper. She  Is finishing doxycycline with 2 days left. CHEST - 2 VIEW 08/27/11-  Comparison: 05/10/2011  Findings: Mild peribronchial thickening. Heart and mediastinal  contours are within normal limits. No focal opacities or  effusions. No acute bony abnormality.  IMPRESSION:  Mild bronchitic changes.  Original Report Authenticated By: [name], M.D.   11/08/11- 48 yo F former smoker, followed here for obstructive sleep apnea and for hx bronchitis, allergic rhinitis, complicated by DM, hypothyroid, CAD  Continues using CPAP 13 almost every night. No sleeping pills. Nebulizer treatment and Depo-Medrol did help her breathing last visit. She would like a home nebulizer which we discussed. Singulair did not help. Blames her cough on postnasal drip or getting too hot. Has history of occasional thrush which might also contribute to throat discomfort.   05/11/2012 Acute OV  Complains of head congestion w/ green drainage, PND, prod cough with gree mucus, hoarseness, increased SOB, wheezing, some tightness x2 weeks - denies f/c/s.  has finished abx and pred thru PCP w/in last 2 weeks Took keflex and steroid taper with minimal  improvement. Has sinus congestion, ear fullness, hoarseness and drainage.  No hemotpysis or chest pain  No edema.  OTC not helping with cough.   06/29/12- 70 yo F former smoker, followed here for obstructive sleep apnea and for hx bronchitis, allergic rhinitis, complicated by DM, hypothyroid,  CAD  FOLLOWS FOR: still having cough and feels fullness in chest coming back since seeign TP.; discomfort on right side lung area CPAP 13 Feeling better after she was seen by the nurse practitioner October 4. Now has caught a new cold. Sneezing. Hard cough caused sharp pain right lateral rib. Area still hurts to cough. Scant phlegm, mild sore throat, no fever but did have some chills originally. Took Augmentin then amoxicillin used for dental prophylaxis. CXR 05/11/12-reviewed IMPRESSION:  No acute cardiopulmonary abnormality.  Original Report Authenticated By: [name], M.D.   11/06/12- 61 yo F former smoker, followed here for obstructive sleep apnea and for hx bronchitis, allergic rhinitis, complicated by DM, hypothyroid, CAD , [facility] FOLLOW FOR:c/o sinus congestion,chest tightness x 4 days,mild sob w exertion,,cough and nasal congestion yellow,,right ear stopped up and hurting,Finished mouthwash that was called in 2 wks. Ago Eyes itch, postnasal drip, malaise x 4 days. Amoxacillin 2 weeks ago for ?strep throat. Diazepam for cramps.  CPAP 13/ [facility].   ROS-see HPI Constitutional:   No-   weight loss, night sweats, fevers, +chills, fatigue, lassitude. HEENT:   No-  headaches, difficulty swallowing, tooth/dental problems, sore throat,      +sneezing, itching, ear ache, nasal congestion, post nasal drip,  CV:  No- chest pain, no-orthopnea, PND, swelling in lower extremities, anasarca,  dizziness, palpitations Resp: No-   shortness of breath with exertion or at rest.              +  productive cough,  + non-productive cough,  No- coughing up of blood.              +  change in color of mucus.  No- wheezing.   Skin: No-   rash or lesions. GI:  No-   heartburn, indigestion, abdominal pain, nausea, vomiting,  GU: . MS:  No-   joint pain or swelling.  . Neuro-     nothing unusual Psych:  No- change in mood or affect. No depression or anxiety.  No memory loss.  Objective:   Physical  Exam OBJ- Physical Exam General- Alert, Oriented, Affect-appropriate, Distress- none acute Skin- rash-none, lesions- none, excoriation- none Lymphadenopathy- none Head- atraumatic            Eyes- + periorbital edemaGross vision intact, PERRLA, conjunctivae and secretions clear            Ears- Hearing, canals-normal            Nose- + turb edema,+ hoarse no-Septal dev, mucus, polyps, erosion, perforation             Throat- Mallampati II , mucosa clear , drainage- none, tonsils- atrophic Neck- flexible , trachea midline, no stridor , thyroid nl, carotid no bruit Chest - symmetrical excursion , unlabored           Heart/CV- RRR , no murmur , no gallop  , no rub, nl s1 s2                           - JVD- none , edema- none, stasis changes- none, varices- none           Lung-  No-wheeze-, +rattle cough , dullness-none, rub- none           Chest wall- tender right lower anterior rib under breast without rub or crepitus Abd-  Br/ Gen/ Rectal- Not done, not indicated Extrem- cyanosis- none, clubbing, none, atrophy- none, strength- nl Neuro- grossly intact to observation

## 2012-11-06 NOTE — Patient Instructions (Addendum)
Script refill cough syrup  Script for antibiotic omnicef  Neb neo nasal  Depo 80

## 2012-11-07 ENCOUNTER — Telehealth: Payer: Self-pay | Admitting: Internal Medicine

## 2012-11-07 NOTE — Telephone Encounter (Signed)
ATC patient, no answer [id number] 

## 2012-11-08 MED ORDER — FLUCONAZOLE 150 MG PO TABS: 150.0000 mg | ORAL_TABLET | Freq: Every day | ORAL | Status: DC

## 2012-11-08 NOTE — Telephone Encounter (Signed)
Pt returned call. She is aware rx has been called in to pharmacy -she has been given instructions re: directions for Diflucan - also aware that pcc's have msg re: nasal patch. Nothing further needed per pt. Mariann Laster

## 2012-11-08 NOTE — Telephone Encounter (Signed)
lmomtcb x1 for pt--RX has been sent and also will forward message to [facility] regarding provent nasal patch

## 2012-11-08 NOTE — Telephone Encounter (Signed)
LMTCB

## 2012-11-08 NOTE — Telephone Encounter (Signed)
Last OV 11/06/12 Pending 06/06/13

## 2012-11-08 NOTE — Telephone Encounter (Signed)
Per CY-we are not getting reliable service from Provent people-may have to drop this; give Diflucan 150 mg #4 take 1 po qd x 4 days no refills.

## 2012-11-08 NOTE — Telephone Encounter (Signed)
I spoke with pt. She stated she has developed a vaginal yeast infection from the depo injection from ysterday and abx. Requesting RX for this. Also per pt she never heard back regarding the provent nose patch for her CPAP. Look like PCC's had sent this order over back in Oct but per pt she never heard from anyone. Please advise Dr. Annamaria Boots and PCC's thanks  Allergies  Allergen Reactions  . Codeine     REACTION: hallucinations/"loopy"  . Sulfonamide Derivatives Hives

## 2012-11-14 NOTE — Assessment & Plan Note (Addendum)
Either viral syndrome URI or seasonal allergy or both. Plan- fluids, sudafed-pe, nasal neb decongestant, depo 80, omnicef

## 2012-11-14 NOTE — Assessment & Plan Note (Addendum)
URI with tracheobronchitis Plan- omnicef. Depo.

## 2012-11-21 ENCOUNTER — Ambulatory Visit (HOSPITAL_COMMUNITY)
Admission: RE | Admit: 2012-11-21 | Discharge: 2012-11-21 | Disposition: A | Discharge status: Home / Self Care | Payer: 59 | Source: Ambulatory Visit | Attending: Gastroenterology | Admitting: Gastroenterology

## 2012-11-21 ENCOUNTER — Encounter (HOSPITAL_COMMUNITY)
Admission: RE | Admit: 2012-11-21 | Discharge: 2012-11-21 | Disposition: A | Discharge status: Home / Self Care | Payer: 59 | Source: Ambulatory Visit | Attending: Gastroenterology | Admitting: Gastroenterology

## 2012-11-21 DIAGNOSIS — R112 Nausea with vomiting, unspecified: Secondary | ICD-10-CM

## 2012-11-21 DIAGNOSIS — R109 Unspecified abdominal pain: Secondary | ICD-10-CM | POA: Insufficient documentation

## 2012-11-21 MED ORDER — SINCALIDE 5 MCG IJ SOLR: 0.0200 ug/kg | Freq: Once | INTRAMUSCULAR | Status: AC

## 2012-11-21 MED ORDER — TECHNETIUM TC 99M MEBROFENIN IV KIT
5.0000 | PACK | Freq: Once | INTRAVENOUS | Status: AC | PRN
  Administered 2012-11-21: 5 via INTRAVENOUS

## 2012-11-21 MED ORDER — SINCALIDE 5 MCG IJ SOLR
INTRAMUSCULAR | Status: AC
  Administered 2012-11-21: 1.45 ug via INTRAVENOUS
  Filled 2012-11-21: qty 5

## 2012-11-23 ENCOUNTER — Encounter (HOSPITAL_COMMUNITY)
Admission: RE | Admit: 2012-11-23 | Discharge: 2012-11-23 | Disposition: A | Discharge status: Home / Self Care | Payer: 59 | Source: Ambulatory Visit | Attending: Gastroenterology | Admitting: Gastroenterology

## 2012-11-23 DIAGNOSIS — E119 Type 2 diabetes mellitus without complications: Secondary | ICD-10-CM | POA: Insufficient documentation

## 2012-11-23 DIAGNOSIS — R141 Gas pain: Secondary | ICD-10-CM | POA: Insufficient documentation

## 2012-11-23 DIAGNOSIS — R143 Flatulence: Secondary | ICD-10-CM | POA: Insufficient documentation

## 2012-11-23 DIAGNOSIS — R112 Nausea with vomiting, unspecified: Secondary | ICD-10-CM | POA: Insufficient documentation

## 2012-11-23 DIAGNOSIS — R109 Unspecified abdominal pain: Secondary | ICD-10-CM | POA: Insufficient documentation

## 2012-11-23 DIAGNOSIS — R142 Eructation: Secondary | ICD-10-CM | POA: Insufficient documentation

## 2012-11-23 MED ORDER — TECHNETIUM TC 99M SULFUR COLLOID
2.0000 | Freq: Once | INTRAVENOUS | Status: AC | PRN
  Administered 2012-11-23: 2 via ORAL

## 2012-12-19 ENCOUNTER — Telehealth: Payer: Self-pay | Admitting: Internal Medicine

## 2012-12-19 MED ORDER — HYDROCOD POLST-CHLORPHEN POLST 10-8 MG/5ML PO LQCR: 5.0000 mL | Freq: Two times a day (BID) | ORAL | Status: DC | PRN

## 2012-12-19 NOTE — Telephone Encounter (Signed)
Please advise if okay to refill medication. Thanks.

## 2012-12-19 NOTE — Telephone Encounter (Signed)
Ok to refill cough syrup 

## 2012-12-28 ENCOUNTER — Ambulatory Visit: Payer: 59 | Admitting: Internal Medicine

## 2013-01-10 ENCOUNTER — Other Ambulatory Visit: Payer: Self-pay | Admitting: Internal Medicine

## 2013-01-15 ENCOUNTER — Telehealth: Payer: Self-pay | Admitting: Internal Medicine

## 2013-01-15 MED ORDER — HYDROCOD POLST-CHLORPHEN POLST 10-8 MG/5ML PO LQCR: 5.0000 mL | Freq: Two times a day (BID) | ORAL | Status: DC | PRN

## 2013-01-15 MED ORDER — AMOXICILLIN-POT CLAVULANATE 875-125 MG PO TABS: 1.0000 | ORAL_TABLET | Freq: Two times a day (BID) | ORAL | Status: DC

## 2013-01-15 NOTE — Telephone Encounter (Signed)
Per CY-give Augmentin 875 mg #14 take 1 po BID no refills and okay to refill cough syrup. Thanks.

## 2013-01-15 NOTE — Telephone Encounter (Signed)
Left detailed message on pt VM advising of CDY recs. [id number] called in. Advised to call back if she had any questions

## 2013-01-15 NOTE — Telephone Encounter (Signed)
i spoke with [name]. She c/o cough w/ green phlem, hoarseness, sinus drainage, nasal congestion, chest congestion x 1 week. She has been taking mucinex 1 tab BID and using saline nasal spray. She is requesting to have something called in and hydrocodone cough syrup. Please advise Dr. Annamaria Boots thanks Last OV 11/06/12 Pending 06/06/13 Allergies  Allergen Reactions  . Codeine     REACTION: hallucinations/"loopy"  . Sulfonamide Derivatives Hives

## 2013-01-16 ENCOUNTER — Telehealth: Payer: Self-pay | Admitting: Internal Medicine

## 2013-01-16 NOTE — Telephone Encounter (Signed)
Spoke with patient-aware that CY is out of the office this afternoon but I'll send this message to him to see if we can change the RX to Hydromet cough syrup instead. Pt is okay with a call back tomorrow morning. Please advise. Thanks.

## 2013-01-17 MED ORDER — HYDROCODONE-HOMATROPINE 5-1.5 MG/5ML PO SYRP: 5.0000 mL | ORAL_SOLUTION | Freq: Four times a day (QID) | ORAL | Status: DC | PRN

## 2013-01-17 NOTE — Telephone Encounter (Signed)
Per CY-okay to have Hydromet cough syrup as requested.

## 2013-01-17 NOTE — Telephone Encounter (Signed)
Per CY: okay for #289mL as last refilled Called spoke with patient, she is aware that [place] for refill on the [employer], spoke with pharmacist Jenny Reichmann and gave verbal order for hydromet - also made John aware that pt did just get Tussionex and that CY is aware of this as pt did not like that medication. Nothing further needed; will sign off

## 2013-04-15 ENCOUNTER — Telehealth: Payer: Self-pay | Admitting: Internal Medicine

## 2013-04-15 MED ORDER — HYDROCODONE-HOMATROPINE 5-1.5 MG/5ML PO SYRP: 5.0000 mL | ORAL_SOLUTION | Freq: Four times a day (QID) | ORAL | Status: DC | PRN

## 2013-04-15 MED ORDER — CLARITHROMYCIN 500 MG PO TABS: 500.0000 mg | ORAL_TABLET | Freq: Two times a day (BID) | ORAL | Status: DC

## 2013-04-15 NOTE — Telephone Encounter (Signed)
I spoke with pt. She c/o cough w/ green phlem, hoarseness, sore throat, slight wheezing and chest tx, nasal congestion and chest congestion, blowing out green phlem x 1 week. She has been taking OTC mucinex. She is requesting cough syrup and ABX. Please advise Dr. Annamaria Boots thanks Last OV 11/16/12 Pending 06/06/13  Allergies  Allergen Reactions  . Codeine     REACTION: hallucinations/"loopy"  . Sulfonamide Derivatives Hives

## 2013-04-15 NOTE — Telephone Encounter (Signed)
I spoke with pt. Aware of recs. Rx's have been called in for pt.

## 2013-04-15 NOTE — Telephone Encounter (Signed)
Per CY-okay to refill her Tussionex; Also Rx Biaxin 500 mg #14 take 1 po BID after meals.

## 2013-04-19 ENCOUNTER — Telehealth: Payer: Self-pay | Admitting: *Deleted

## 2013-04-19 MED ORDER — DOXYCYCLINE HYCLATE 100 MG PO TABS: ORAL_TABLET | ORAL | Status: DC

## 2013-04-19 NOTE — Telephone Encounter (Signed)
Cancel Biaxin script- too many medication interactions. Instead offer doxycycline 100 mg, # 8, 2 today then one daily

## 2013-04-19 NOTE — Telephone Encounter (Signed)
Called pharm and cancelled rx and sent in rx for doxy

## 2013-04-19 NOTE — Telephone Encounter (Signed)
Received fax from [facility] regarding two major interactions and moderate interactions between clarithromycin and current meds she is taking.  They are requesting CDY review this information and advise if he would like to continue with the clarithromycin rx.  I have placed this fax on Dr. Janee Morn cart along with phone msg to be addressed today as pharmacists states this medication still has not been filled.  Dr. Annamaria Boots, pls advise.  Thank you.

## 2013-05-08 ENCOUNTER — Telehealth: Payer: Self-pay | Admitting: Internal Medicine

## 2013-05-08 MED ORDER — FLUCONAZOLE 150 MG PO TABS: 150.0000 mg | ORAL_TABLET | Freq: Every day | ORAL | Status: DC

## 2013-05-08 NOTE — Telephone Encounter (Signed)
Diflucan 150 mg, # 4, 1 daily

## 2013-05-08 NOTE — Telephone Encounter (Signed)
Pt aware of recs. RX has been sent. Nothing further needed

## 2013-05-08 NOTE — Telephone Encounter (Signed)
I spoke with pt. She stated she has thrush. She has white patches on tongue and back of throat. It started last week but she also had a cold last week. She had an ABX giving from PCP and thinks that is what it came from. She is requesting to have something called in for her thrush. Please advise Dr. Annamaria Boots thanks Last OV 11/06/12 Allergies  Allergen Reactions  . Codeine     REACTION: hallucinations/"loopy"  . Sulfonamide Derivatives Hives

## 2013-06-04 ENCOUNTER — Telehealth: Payer: Self-pay | Admitting: Internal Medicine

## 2013-06-04 NOTE — Telephone Encounter (Signed)
Discussed with Joellen Jersey, unable to work pt in on her requested date of 11.4.14 Called spoke with patient, apologized that we are unable to work her in on 11.4.14 d/t no openings  Patient asked if we are open for [facility] Day or the day before Thanksgiving Ok per Joellen Jersey to use blocked spoke for 11.11.14 @ 0945. Pt okay with this time; appt scheduled and nothing further needed

## 2013-06-06 ENCOUNTER — Ambulatory Visit: Payer: 59 | Admitting: Internal Medicine

## 2013-06-18 ENCOUNTER — Ambulatory Visit (INDEPENDENT_AMBULATORY_CARE_PROVIDER_SITE_OTHER): Payer: 59 | Admitting: Internal Medicine

## 2013-06-18 ENCOUNTER — Encounter (INDEPENDENT_AMBULATORY_CARE_PROVIDER_SITE_OTHER): Payer: Self-pay

## 2013-06-18 ENCOUNTER — Encounter: Payer: Self-pay | Admitting: Internal Medicine

## 2013-06-18 VITALS — BP 130/84 | HR 67 | Ht 62.0 in | Wt 165.0 lb

## 2013-06-18 DIAGNOSIS — J209 Acute bronchitis, unspecified: Secondary | ICD-10-CM

## 2013-06-18 DIAGNOSIS — G4733 Obstructive sleep apnea (adult) (pediatric): Secondary | ICD-10-CM

## 2013-06-18 DIAGNOSIS — J019 Acute sinusitis, unspecified: Secondary | ICD-10-CM

## 2013-06-18 MED ORDER — ALBUTEROL SULFATE (2.5 MG/3ML) 0.083% IN NEBU: 2.5000 mg | INHALATION_SOLUTION | Freq: Four times a day (QID) | RESPIRATORY_TRACT | Status: DC | PRN

## 2013-06-18 MED ORDER — METHYLPREDNISOLONE ACETATE 80 MG/ML IJ SUSP
80.0000 mg | Freq: Once | INTRAMUSCULAR | Status: AC
  Administered 2013-06-18: 80 mg via INTRAMUSCULAR

## 2013-06-18 MED ORDER — PHENYLEPHRINE HCL 1 % NA SOLN: 1.0000 [drp] | Freq: Four times a day (QID) | NASAL | Status: DC | PRN

## 2013-06-18 MED ORDER — AMOXICILLIN-POT CLAVULANATE 875-125 MG PO TABS: 1.0000 | ORAL_TABLET | Freq: Two times a day (BID) | ORAL | Status: DC

## 2013-06-18 MED ORDER — PHENYLEPHRINE HCL 1 % NA SOLN
3.0000 [drp] | Freq: Four times a day (QID) | NASAL | Status: DC | PRN
  Administered 2013-06-18: 3 [drp] via NASAL

## 2013-06-18 NOTE — Patient Instructions (Signed)
Script printed for augmentin antibiotic in case sinus infection gets worse  Mucinex and salt water rinse are fine if they seem to help  Neb neo nasal, depo 80   For acute sinusitis

## 2013-06-18 NOTE — Progress Notes (Signed)
Patient ID: [name], female    DOB: 1955/12/06, 57 y.o.   MRN: [id number]  HPI 11/08/10- 40 yo F former smoker, followed here for obstructive sleep apnea and for hx bronchitis.  She was recently treated by her PCP for acute bronchitis. Onset after mowing grass. Still coughing up thick greenish yellow mucus after augmentin they gave her. She thinks she does better with doxycycline. Denies fever, swollen glands, sore throat.  Last seen here May 10, 2010. Hx of allergy testing but never on vaccine. Pollen is bothering with nasal congestion and wheezey cough more this year.  Continues CPAP at 13 cwp all night every night.  Sleeps ok. She asks about alternatives and we discussed palatal surgeries, oral appliances and Provent nasal valves. [name] Twin sister had MI.  05/10/11- 53 yo F former smoker, followed here for obstructive sleep apnea and for hx bronchitis, allergic rhinitis, complicated by DM, hypothyroid, CAD  Since last here, Dr. Claiborne Billings placed a coronary stent. 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  CPAP compliance is good all night every night. She isn't sure about mask and we discussed going to [facility] to look at alternatives.  Wants flu vac. She still feels tight in the chest that she continues Advair 250. Some wheeze. Uses rescue inhaler and asks about getting a nebulizer machine. We had called in a Z-Pak which she says was not strong enough. She denies discolored sputum or fever. We discussed using cortisone and anticipated impact on her insulin-dependent diabetes temporarily. She complains of a "raw" substernal sensation but thinks Nexium controls her reflux well.  08/29/11-  52 yo F former smoker, followed here for obstructive sleep apnea and for hx bronchitis, allergic rhinitis, complicated by DM, hypothyroid, CAD  Acute visit-  deep cough-yellow in color and streaks of blood at times; SOB and wheezing-went to ER Saturday([facility])Lower  back pain  she did have flu vaccine. Last week had GI upset with vomiting and right upper quadrant pain. She still has her gallbladder. That discomfort has eased off. 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 As her GI upset resolved, she developed chest tightness with cough and cold symptoms, fever and chills 3 days ago now resolved. Head and chest are still congested. [facility] his nebulizer treatment x2 started prednisone taper. She  Is finishing doxycycline with 2 days left. CHEST - 2 VIEW 08/27/11-  Comparison: 05/10/2011  Findings: Mild peribronchial thickening. Heart and mediastinal  contours are within normal limits. No focal opacities or  effusions. No acute bony abnormality.  IMPRESSION:  Mild bronchitic changes.  Original Report Authenticated By: [name], M.D.   11/08/11- 25 yo F former smoker, followed here for obstructive sleep apnea and for hx bronchitis, allergic rhinitis, complicated by DM, hypothyroid, CAD  Continues using CPAP 13 almost every night. No sleeping pills. Nebulizer treatment and Depo-Medrol did help her breathing last visit. She would like a home nebulizer which we discussed. Singulair did not help. Blames her cough on postnasal drip or getting too hot. Has history of occasional thrush which might also contribute to throat discomfort.   05/11/2012 Acute OV  Complains of head congestion w/ green drainage, PND, prod cough with gree mucus, hoarseness, increased SOB, wheezing, some tightness x2 weeks - denies f/c/s.  has finished abx and pred thru PCP w/in last 2 weeks Took keflex and steroid taper with minimal  improvement. Has sinus congestion, ear fullness, hoarseness and drainage.  No hemotpysis or chest pain  No edema.  OTC not helping with cough.   06/29/12- 73 yo F former smoker, followed here for obstructive sleep apnea and for hx bronchitis, allergic rhinitis, complicated by DM, hypothyroid,  CAD  FOLLOWS FOR: still having cough and feels fullness in chest coming back since seeign TP.; discomfort on right side lung area CPAP 13 Feeling better after she was seen by the nurse practitioner October 4. Now has caught a new cold. Sneezing. Hard cough caused sharp pain right lateral rib. Area still hurts to cough. Scant phlegm, mild sore throat, no fever but did have some chills originally. Took Augmentin then amoxicillin used for dental prophylaxis. CXR 05/11/12-reviewed IMPRESSION:  No acute cardiopulmonary abnormality.  Original Report Authenticated By: [name], M.D.   11/06/12- 26 yo F former smoker, followed here for obstructive sleep apnea and for hx bronchitis, allergic rhinitis, complicated by DM, hypothyroid, CAD , OSA FOLLOW FOR:c/o sinus congestion,chest tightness x 4 days,mild sob w exertion,,cough and nasal congestion yellow,,right ear stopped up and hurting,Finished mouthwash that was called in 2 wks. Ago Eyes itch, postnasal drip, malaise x 4 days. Amoxacillin 2 weeks ago for ?strep throat. Diazepam for cramps.  CPAP 13/ [facility].   06/18/13- 6 yo F former smoker, followed here for obstructive sleep apnea and for hx bronchitis, allergic rhinitis, complicated by DM, hypothyroid, CAD , OSA FOLLOWS FOR: Breathing is slightly improved. Reports SOB and wheezing. Denies chest tightness today. Is producing green mucus from nose. Denies fever or sore throat. Using Mucinex and saline rinse. Had flu vaccine. CPAP 13/ [facility] is not being used as much as she should. Using Nexium, feels no reflux or heartburn  ROS-see HPI Constitutional:   No-   weight loss, night sweats, fevers, chills, fatigue, lassitude. HEENT:   No-  headaches, difficulty swallowing, tooth/dental problems, sore throat,      +sneezing, itching, ear ache, +nasal congestion, post nasal drip,  CV:  No- chest pain, no-orthopnea, PND, swelling in lower extremities, anasarca,  dizziness, palpitations Resp: No-    shortness of breath with exertion or at rest.              No-  productive cough,  + non-productive cough,  No- coughing up of blood.              +  change in color of mucus.  No- wheezing.   Skin: No-   rash or lesions. GI:  No-   heartburn, indigestion, abdominal pain, nausea, vomiting,  GU: . MS:  No-   joint pain or swelling.  . Neuro-     nothing unusual Psych:  No- change in mood or affect. No depression or anxiety.  No memory loss.  Objective:   Physical Exam OBJ- Physical Exam General- Alert, Oriented, Affect-appropriate, Distress- none acute Skin- rash-none, lesions- none, excoriation- none Lymphadenopathy- none Head- atraumatic            Eyes- + periorbital edema, Gross vision intact, PERRLA, conjunctivae and secretions clear            Ears- Hearing, canals-normal            Nose- + turb edema,  no-Septal dev, mucus, polyps, erosion, perforation             Throat- Mallampati II , mucosa clear , drainage- none, tonsils- atrophic Neck- flexible , trachea midline, no stridor , thyroid nl, carotid no bruit  Chest - symmetrical excursion , unlabored           Heart/CV- RRR , no murmur , no gallop  , no rub, nl s1 s2                           - JVD- none , edema- none, stasis changes- none, varices- none           Lung-  No-wheeze-, no- cough , dullness-none, rub- none           Chest wall- tender right lower anterior rib under breast without rub or crepitus Abd-  Br/ Gen/ Rectal- Not done, not indicated Extrem- cyanosis- none, clubbing, none, atrophy- none, strength- nl Neuro- grossly intact to observation

## 2013-06-24 ENCOUNTER — Telehealth: Payer: Self-pay | Admitting: Internal Medicine

## 2013-06-24 MED ORDER — HYDROCODONE-HOMATROPINE 5-1.5 MG/5ML PO SYRP: 5.0000 mL | ORAL_SOLUTION | Freq: Four times a day (QID) | ORAL | Status: DC | PRN

## 2013-06-24 NOTE — Telephone Encounter (Signed)
I spoke with pt. She is requesting a refill on hydromet cough syrup. This was last refilled 04/25/13 #240 ml X 0 Refills. Pt is asking for larger quantity. Please advise Dr. Annamaria Boots thanks

## 2013-06-24 NOTE — Telephone Encounter (Signed)
Ok to refill, but this is a narcotic and can be addictive, so i won't increase the amount.

## 2013-06-24 NOTE — Telephone Encounter (Signed)
Pt aware rx left for pick up

## 2013-07-04 NOTE — Assessment & Plan Note (Signed)
Recent increased nasal discharge. It sounds as if we may be able to handle this with Mucinex and saline rinse but she will call for antibiotic if needed. Plan-Neti Nasal Decongestant, Depo-Medrol, discussion of steroids and diabetes

## 2013-07-04 NOTE — Assessment & Plan Note (Signed)
Plan-refill rescue inhaler

## 2013-07-04 NOTE — Assessment & Plan Note (Addendum)
Encouraged routine uses CPAP and asked her to notice specific reasons if she takes it off at night She will talk with DME about replacement machine

## 2013-07-10 ENCOUNTER — Encounter: Payer: Self-pay | Admitting: *Deleted

## 2013-07-15 ENCOUNTER — Telehealth: Payer: Self-pay | Admitting: Internal Medicine

## 2013-07-15 MED ORDER — FLUCONAZOLE 150 MG PO TABS: 150.0000 mg | ORAL_TABLET | Freq: Every day | ORAL | Status: DC

## 2013-07-15 NOTE — Telephone Encounter (Signed)
Questionnaire copied below:   [name] - Questionnaire Submission ','[facility] Detail >>          Questionnaire Submission  Kiah V Shavelle Tep  MRN: [id number] DOB: 05/06/1956     Pt Work: [phone] Pt Home: [phone]   Sent: Sat July 13, 2013 11:33 AM   Entered: [phone]                           Current View: Showing all answers Show Only Relevant Answers    Legend: Scores, Non-relevant Questions     Patient Responses     Chl Mychart After Visit Questionnaire    Question 07/13/2013 11:33 AM   How are you feeling after your recent visit? Some better, but still not all there yet.   Does the recommended course of treatment seem to be helping your symptoms? I findly had to get the antibotic filled. Still not feeling the best after completing the antibotic.   Are you experiencing any side effects from your recommended treatment? Yeast infection.   is there anything else you would like to ask your physician? No.         Message    Patient Questionnaire Submission   --------------------------------      Questionnaire: Questionnaire      Question: How are you feeling after your recent visit?   Answer: Some better, but still not all there yet.      Question: Does the recommended course of treatment seem to be helping your symptoms?   Answer: I findly had to get the antibotic filled. Still not feeling the best after completing the antibotic.      Question: Are you experiencing any side effects from your recommended treatment?   Answer: Yeast infection.      Question: is there anything else you would like to ask your physician?   Answer: No.      ----- Message -----   From: Deneise Lever, MD   Sent: 07/04/2013 1:18 PM   To: [name]   Subject: Questionnaire       To ensure we are providing you the highest quality healthcare, we'd like to know how you are feeling after your recent visit. At your earliest convenience,  please complete the brief follow-up assessment by clicking the Task: Questionnaire link listed above.      Thank you for your time in helping [place] improve our services and for partnering with [place] in your wellness and care.      Sincerely,      Your Care Team

## 2013-07-15 NOTE — Telephone Encounter (Signed)
Called and spoke with pt and she stated that she has finished the abx and the wheezing in some better but still has some at times.  She stated that she does have some yeast now but has been using the monistat otc and this has helped some but has not cleared up all the way.    Spoke with CY and he stated ok to call in diflucan 150 mg  #3  1 daily.  Pt is aware of the medication sent to her pharmacy and pt was advised to call back for any increase in wheezing, cough with green or yellow sputum, chest tightness.  Pt voiced her understanding and nothing further is needed.

## 2013-07-23 ENCOUNTER — Other Ambulatory Visit: Payer: Self-pay | Admitting: Internal Medicine

## 2013-08-05 ENCOUNTER — Ambulatory Visit (INDEPENDENT_AMBULATORY_CARE_PROVIDER_SITE_OTHER): Payer: 59 | Admitting: Cardiovascular Disease

## 2013-08-05 ENCOUNTER — Encounter: Payer: Self-pay | Admitting: Cardiovascular Disease

## 2013-08-05 VITALS — BP 128/70 | HR 62 | Ht 62.0 in | Wt 168.7 lb

## 2013-08-05 DIAGNOSIS — I10 Essential (primary) hypertension: Secondary | ICD-10-CM

## 2013-08-05 DIAGNOSIS — E039 Hypothyroidism, unspecified: Secondary | ICD-10-CM

## 2013-08-05 DIAGNOSIS — E785 Hyperlipidemia, unspecified: Secondary | ICD-10-CM | POA: Insufficient documentation

## 2013-08-05 DIAGNOSIS — R071 Chest pain on breathing: Secondary | ICD-10-CM

## 2013-08-05 DIAGNOSIS — I25810 Atherosclerosis of coronary artery bypass graft(s) without angina pectoris: Secondary | ICD-10-CM

## 2013-08-05 DIAGNOSIS — J209 Acute bronchitis, unspecified: Secondary | ICD-10-CM

## 2013-08-05 DIAGNOSIS — E119 Type 2 diabetes mellitus without complications: Secondary | ICD-10-CM

## 2013-08-05 DIAGNOSIS — E782 Mixed hyperlipidemia: Secondary | ICD-10-CM

## 2013-08-05 DIAGNOSIS — R0789 Other chest pain: Secondary | ICD-10-CM | POA: Insufficient documentation

## 2013-08-05 DIAGNOSIS — G4733 Obstructive sleep apnea (adult) (pediatric): Secondary | ICD-10-CM

## 2013-08-05 DIAGNOSIS — I2510 Atherosclerotic heart disease of native coronary artery without angina pectoris: Secondary | ICD-10-CM

## 2013-08-05 MED ORDER — OMEGA-3-ACID ETHYL ESTERS 1 G PO CAPS: 2.0000 g | ORAL_CAPSULE | Freq: Two times a day (BID) | ORAL | Status: DC

## 2013-08-05 NOTE — Progress Notes (Signed)
Patient ID: [name], female   DOB: 1956/07/20, 57 y.o.   MRN: [id number]     HPI: [name] is a 57 y.o. female who presents to the office for one year cardiology evaluation.  This Noyer has a history of coronary artery disease and in 2007 underwent insertion of a 3.0x13 mm Cypher DES stent to her proximal RCA. She did have concomitant CAD with 60-70% stenosis of her LAD and 20% circumflex stenosis. In July 2010 she develop recurrent chest pain and repeat catheterization showed 60% LAD stenosis, 20-30% circumflex stenosis and a widely patent RCA stent.  She has a history of hypertension, hyperlipidemia, hypothyroidism, diabetes mellitus, asthma, GERD, as well as intermittent leg swelling. Recently, she has noticed some vague superficial chest sensation which she has attributed to possible indigestion. She does drive a school bus full-time. She denies any exertional precipitation of this chest discomfort. She has felt fairly stable over the past year. She presents for evaluation.  She did have laboratory done by Dr. Forde Dandy and I reviewed these from June. Of note, her total cholesterol at that time was 157 triglycerides were still elevated at 229 HDL 32 LDL 79 glucose was 122. She had normal LFTs and renal function. She did not have microalbuminuria.  Past Medical History  Diagnosis Date  . Unspecified hypothyroidism   . Cough   . Acute bronchitis   . Obstructive sleep apnea (adult) (pediatric)   . Coronary atherosclerosis   . Type II or unspecified type diabetes mellitus without mention of complication, not stated as uncontrolled     Past Surgical History  Procedure Laterality Date  . Tubal ligation    . Anterior fusion cervical spine      Allergies  Allergen Reactions  . Codeine     REACTION: hallucinations/"loopy"  . Sulfonamide Derivatives Hives  . Sulfa Antibiotics Hives    Current Outpatient Prescriptions  Medication Sig Dispense Refill  . ADVAIR DISKUS  250-50 MCG/DOSE AEPB INHALE ONE (1) PUFF(S) TWICE DAILY RINSE MOUTH  60 each  5  . albuterol (PROVENTIL HFA) 108 (90 BASE) MCG/ACT inhaler Inhale 2 puffs into the lungs every 6 (six) hours as needed for wheezing or shortness of breath. For shortness of breath and wheezing  1 Inhaler  prn  . albuterol (PROVENTIL) (2.5 MG/3ML) 0.083% nebulizer solution Take 3 mLs (2.5 mg total) by nebulization every 6 (six) hours as needed for wheezing or shortness of breath.  75 mL  12  . atorvastatin (LIPITOR) 80 MG tablet Take 80 mg by mouth daily.      . diazepam (VALIUM) 5 MG tablet Take 0.5 tablets by mouth as needed. .5 to 1 tablet prn for muscle cramps      . diclofenac (VOLTAREN) 75 MG EC tablet Take 75 mg by mouth daily as needed. For muscle aches      . esomeprazole (NEXIUM) 40 MG capsule Take 40 mg by mouth daily before breakfast.        . furosemide (LASIX) 40 MG tablet Take 40 mg by mouth daily as needed. For fluid retention      . guaiFENesin (MUCINEX) 600 MG 12 hr tablet Take 1,200 mg by mouth 2 (two) times daily as needed.       Marland Kitchen HYDROcodone-homatropine (HYDROMET) 5-1.5 MG/5ML syrup Take 5 mLs by mouth every 6 (six) hours as needed for cough.  240 mL  0  . insulin glargine (LANTUS) 100 UNIT/ML injection Inject into the skin at bedtime. 15 units  at night x 10 wks.      . isosorbide mononitrate (IMDUR) 120 MG 24 hr tablet Take 120 mg by mouth daily.       Marland Kitchen levothyroxine (SYNTHROID, LEVOTHROID) 200 MCG tablet Take 200 mcg by mouth daily.      Marland Kitchen LOVAZA 1 G capsule Take 1 tablet by mouth BID times 48H.      . metFORMIN (GLUMETZA) 1000 MG (MOD) 24 hr tablet Take 1,000 mg by mouth 2 (two) times daily with a meal.        . metoprolol succinate (TOPROL-XL) 25 MG 24 hr tablet Take 25 mg by mouth daily.        Marland Kitchen NOVOTWIST 32G X 5 MM MISC Use as directed      . ONE TOUCH ULTRA TEST test strip Use as directed      . triamterene-hydrochlorothiazide (DYAZIDE) 37.5-25 MG per capsule Take 1 capsule by mouth every  morning.        . verapamil (COVERA HS) 240 MG (CO) 24 hr tablet Take 240 mg by mouth daily.       . INVOKANA 100 MG TABS Take 100 mg by mouth daily before breakfast.        No current facility-administered medications for this visit.    History   Social History  . Marital Status: Widowed    Spouse Name: N/A    Number of Children: N/A  . Years of Education: N/A   Occupational History  . retired    Social History Main Topics  . Smoking status: Former Smoker -- 0.10 packs/day for 30 years    Quit date: 08/09/2003  . Smokeless tobacco: Never Used  . Alcohol Use: No  . Drug Use: No  . Sexual Activity: Not on file   Other Topics Concern  . Not on file   Social History Narrative  . No narrative on file   Social she is widowed. She does try to be active. There is no tobacco use. She does drink occasional alcohol.  Family History  Problem Relation Age of Onset  . Hypertension Mother   . Heart disease Mother   . Prostate cancer Father   . Heart disease Brother     ROS is negative for fevers, chills or night sweats. She denies skin rash. She denies change in vision or hearing. She denies lymphadenopathy. She denies PND or orthopnea. She does take Advair and when necessary Proventil for intermittent wheezing. She does have arthritis involving her knees and hips for which she takes voltaren on an as needed basis. She does admit to some nonexertional vague chest sensation which she attributes to possible indigestion she denies exertional precipitate chest pain. There is no presyncope or syncope. She denies nausea vomiting or diarrhea. There is a history of GERD. She is unaware of blood in her stool or urine. She denies any bleeding. She denies claudication. She denies myalgias. Times there is some intermittent leg swelling for which he takes furosemide on an as-needed basis. She does have diabetes and recently was started on interval, and to a medical regimen. There also is a history of  hypothyroidism for which she is on Synthroid replacement. She is unaware of sleep disorder breathing. Other comprehensive 14 point system review is negative.   PE BP 128/70  Pulse 62  Ht 5\' 2"  (1.575 m)  Wt 168 lb 11.2 oz (76.522 kg)  BMI 30.85 kg/m2  General: Alert, oriented, no distress.  Skin: normal turgor, no rashes HEENT:  Normocephalic, atraumatic. Pupils round and reactive; sclera anicteric;no lid lag.  Nose without nasal septal hypertrophy Mouth/Parynx benign; Mallinpatti scale 2 Neck: No JVD, no carotid briuts Lungs: clear to ausculatation and percussion; no wheezing or rales Chest wall: mild tenderness to palpitation particularly over the costochondral region left greater than right. Heart: RRR, s1 s2 normal 1/6 systolic murmur. Abdomen: soft, nontender; no hepatosplenomehaly, BS+; abdominal aorta nontender and not dilated by palpation. Back: no CVA tenderness Pulses 2+ Extremities: no clubbing cyanosis or edema, Homan's sign negative  Neurologic: grossly nonfocal Psychologic: normal affect and mood.  ECG: Normal sinus rhythm at 62 beats per minute. Nonspecific inferolateral T wave changes.  LABS:  BMET    Component Value Date/Time   NA 137 06/05/2008 0645   K 3.2* 06/05/2008 0645   CL 103 06/05/2008 0645   CO2 26 06/05/2008 0645   GLUCOSE 87 06/05/2008 0645   BUN 9 06/05/2008 0645   CREATININE 0.86 06/05/2008 0645   CALCIUM 8.6 06/05/2008 0645   GFRNONAA >60 06/05/2008 0645   GFRAA  Value: >60        The eGFR has been calculated using the MDRD equation. This calculation has not been validated in all clinical 06/05/2008 0645     Hepatic Function Panel     Component Value Date/Time   PROT 7.4 06/01/2008 0820   ALBUMIN 3.3* 06/01/2008 0820   AST 22 06/01/2008 0820   ALT 17 06/01/2008 0820   ALKPHOS 65 06/01/2008 0820   BILITOT 0.5 06/01/2008 0820     CBC    Component Value Date/Time   WBC 8.5 02/23/2012 1218   RBC 4.49 02/23/2012 1218   HGB 11.5*  02/23/2012 1218   HCT 34.6* 02/23/2012 1218   PLT 256 02/23/2012 1218   MCV 77.1* 02/23/2012 1218   MCH 25.6* 02/23/2012 1218   MCHC 33.2 02/23/2012 1218   RDW 16.0* 02/23/2012 1218   LYMPHSABS 3.6 02/23/2012 1218   MONOABS 0.7 02/23/2012 1218   EOSABS 0.3 02/23/2012 1218   BASOSABS 0.0 02/23/2012 1218     BNP    Component Value Date/Time   PROBNP 55.0 06/01/2008 0152    Lipid Panel  No results found for this basename: chol, trig, hdl, cholhdl, vldl, ldlcalc     RADIOLOGY: No results found.    ASSESSMENT AND PLAN: My impression is that Ms. Bandera is a 57 year old female was established coronary artery disease and is now 7-1/2 years status post percutaneous coronary intervention to her proximal right coronary artery. At that time, she did have concomitant CAD involving a 60 - 70% stenosis of her LAD as well as 20-30%.  On physical exam today, she does have chest wall tenderness to palpation suggestive of costochondral type chest pain. I have suggested that she can take her Voltaren or other nonsteroidal anti-inflammatory medicine for this but she should take this with her dose of Nexium which he takes on a when necessary basis for indigestion. I am also recommending she increase her  Lovaza to 2 capsules twice a day in light of her persistent hypertriglyceridemia. She does have an atherogenic dyslipidemia pattern and also is on atorvastatin 80 mg. Dr. Forde Dandy is managing her diabetes medication. We discussed the importance of increased activity. Her last stress test was in August 2012. I am recommending that I see her back in followup in one year and prior to that office visit she undergo a greater than three-year followup nuclear perfusion study to reassess her coronary obstructive disease and make  certain she is not having significant ischemia. I'll see her back in followup of her exercise Myoview scan and further recommendations will be made at that time.    Troy Sine, MD, [facility]    08/05/2013 10:28 AM

## 2013-08-05 NOTE — Patient Instructions (Addendum)
Your physician has requested that you have en exercise stress myoview.It WILL BE SCHEDULE IN 12 MONTHS 07/2014.  For further information please visit [email or web address]. Please follow instruction sheet, as given.    Increase  Lovaza 2 gm twice a day  Your physician wants you to follow-up in 12 months Dr Claiborne Billings. You will receive a reminder letter in the mail two months in advance. If you don't receive a letter, please call our office to schedule the follow-up appointment.

## 2013-08-16 MED ORDER — ISOSORBIDE MONONITRATE ER 120 MG PO TB24: 120.0000 mg | ORAL_TABLET | Freq: Every day | ORAL | Status: DC

## 2013-08-16 NOTE — Addendum Note (Signed)
Addended by: Erasmo Downer R on: 08/16/2013 08:00 AM   Modules accepted: Orders

## 2013-08-19 ENCOUNTER — Encounter: Payer: Self-pay | Admitting: Cardiovascular Disease

## 2013-09-02 ENCOUNTER — Encounter (HOSPITAL_BASED_OUTPATIENT_CLINIC_OR_DEPARTMENT_OTHER): Payer: Self-pay | Admitting: Emergency Medicine

## 2013-09-02 ENCOUNTER — Telehealth: Payer: Self-pay | Admitting: Internal Medicine

## 2013-09-02 ENCOUNTER — Emergency Department (HOSPITAL_BASED_OUTPATIENT_CLINIC_OR_DEPARTMENT_OTHER)
Admission: EM | Admit: 2013-09-02 | Discharge: 2013-09-02 | Disposition: A | Discharge status: Home / Self Care | Payer: 59 | Attending: Emergency Medicine | Admitting: Emergency Medicine

## 2013-09-02 ENCOUNTER — Emergency Department (HOSPITAL_BASED_OUTPATIENT_CLINIC_OR_DEPARTMENT_OTHER): Payer: 59

## 2013-09-02 DIAGNOSIS — Z87891 Personal history of nicotine dependence: Secondary | ICD-10-CM | POA: Insufficient documentation

## 2013-09-02 DIAGNOSIS — Z794 Long term (current) use of insulin: Secondary | ICD-10-CM | POA: Insufficient documentation

## 2013-09-02 DIAGNOSIS — Z8669 Personal history of other diseases of the nervous system and sense organs: Secondary | ICD-10-CM | POA: Insufficient documentation

## 2013-09-02 DIAGNOSIS — E039 Hypothyroidism, unspecified: Secondary | ICD-10-CM | POA: Insufficient documentation

## 2013-09-02 DIAGNOSIS — J40 Bronchitis, not specified as acute or chronic: Secondary | ICD-10-CM | POA: Insufficient documentation

## 2013-09-02 DIAGNOSIS — J029 Acute pharyngitis, unspecified: Secondary | ICD-10-CM | POA: Insufficient documentation

## 2013-09-02 DIAGNOSIS — E119 Type 2 diabetes mellitus without complications: Secondary | ICD-10-CM | POA: Insufficient documentation

## 2013-09-02 DIAGNOSIS — R630 Anorexia: Secondary | ICD-10-CM | POA: Insufficient documentation

## 2013-09-02 DIAGNOSIS — Z9861 Coronary angioplasty status: Secondary | ICD-10-CM | POA: Insufficient documentation

## 2013-09-02 DIAGNOSIS — I2510 Atherosclerotic heart disease of native coronary artery without angina pectoris: Secondary | ICD-10-CM | POA: Insufficient documentation

## 2013-09-02 DIAGNOSIS — Z79899 Other long term (current) drug therapy: Secondary | ICD-10-CM | POA: Insufficient documentation

## 2013-09-02 LAB — COMPREHENSIVE METABOLIC PANEL
ALBUMIN: 3.9 g/dL (ref 3.5–5.2)
ALT: 10 U/L (ref 0–35)
AST: 17 U/L (ref 0–37)
Alkaline Phosphatase: 79 U/L (ref 39–117)
BUN: 13 mg/dL (ref 6–23)
CO2: 21 mEq/L (ref 19–32)
CREATININE: 1.2 mg/dL — AB (ref 0.50–1.10)
Calcium: 9.2 mg/dL (ref 8.4–10.5)
Chloride: 96 mEq/L (ref 96–112)
GFR calc Af Amer: 57 mL/min — ABNORMAL LOW (ref >90)
GFR calc non Af Amer: 49 mL/min — ABNORMAL LOW (ref >90)
Glucose, Bld: 114 mg/dL — ABNORMAL HIGH (ref 70–99)
POTASSIUM: 4.2 meq/L (ref 3.7–5.3)
Sodium: 137 mEq/L (ref 137–147)
TOTAL PROTEIN: 8.8 g/dL — AB (ref 6.0–8.3)
Total Bilirubin: 0.3 mg/dL (ref 0.3–1.2)

## 2013-09-02 LAB — CBC WITH DIFFERENTIAL/PLATELET
BASOS ABS: 0 10*3/uL (ref 0.0–0.1)
BASOS PCT: 0 % (ref 0–1)
EOS ABS: 0.1 10*3/uL (ref 0.0–0.7)
Eosinophils Relative: 1 % (ref 0–5)
HCT: 36.8 % (ref 36.0–46.0)
HEMOGLOBIN: 11.7 g/dL — AB (ref 12.0–15.0)
Lymphocytes Relative: 15 % (ref 12–46)
Lymphs Abs: 1.1 10*3/uL (ref 0.7–4.0)
MCH: 24.7 pg — AB (ref 26.0–34.0)
MCHC: 31.8 g/dL (ref 30.0–36.0)
MCV: 77.8 fL — ABNORMAL LOW (ref 78.0–100.0)
MONO ABS: 0.9 10*3/uL (ref 0.1–1.0)
MONOS PCT: 12 % (ref 3–12)
NEUTROS ABS: 5.5 10*3/uL (ref 1.7–7.7)
NEUTROS PCT: 72 % (ref 43–77)
Platelets: 275 10*3/uL (ref 150–400)
RBC: 4.73 MIL/uL (ref 3.87–5.11)
RDW: 17 % — AB (ref 11.5–15.5)
WBC: 7.7 10*3/uL (ref 4.0–10.5)

## 2013-09-02 LAB — TROPONIN I

## 2013-09-02 LAB — LIPASE, BLOOD: LIPASE: 44 U/L (ref 11–59)

## 2013-09-02 MED ORDER — IPRATROPIUM BROMIDE 0.02 % IN SOLN
0.5000 mg | Freq: Once | RESPIRATORY_TRACT | Status: AC
  Administered 2013-09-02: 0.5 mg via RESPIRATORY_TRACT
  Filled 2013-09-02: qty 2.5

## 2013-09-02 MED ORDER — ALBUTEROL SULFATE (2.5 MG/3ML) 0.083% IN NEBU
5.0000 mg | INHALATION_SOLUTION | Freq: Once | RESPIRATORY_TRACT | Status: AC
  Administered 2013-09-02: 5 mg via RESPIRATORY_TRACT
  Filled 2013-09-02: qty 6

## 2013-09-02 MED ORDER — ALBUTEROL SULFATE HFA 108 (90 BASE) MCG/ACT IN AERS
1.0000 | INHALATION_SPRAY | Freq: Four times a day (QID) | RESPIRATORY_TRACT | Status: DC | PRN
Start: 2013-09-02 — End: 2014-01-23

## 2013-09-02 MED ORDER — HYDROCODONE-HOMATROPINE 5-1.5 MG/5ML PO SYRP: 5.0000 mL | ORAL_SOLUTION | Freq: Four times a day (QID) | ORAL | Status: DC | PRN

## 2013-09-02 MED ORDER — DOXYCYCLINE HYCLATE 100 MG PO TABS: ORAL_TABLET | ORAL | Status: DC

## 2013-09-02 NOTE — ED Provider Notes (Signed)
CSN: [id number]     Arrival date & time 09/02/13  1649 History   First MD Initiated Contact with Patient 09/02/13 1921     Chief Complaint  Patient presents with  . Influenza   (Consider location/radiation/quality/duration/timing/severity/associated sxs/prior Treatment) HPI Comments: Patient presents with a two-day history of cough, congestion, body aches, chills, headache. Temperature at home today was 100.9. Her abdomen and chest are sore from coughing. She has had multiple sick contacts. She has a history of bronchitis, diabetes, CAD with stents. She called her pulmonologist today was prescribed doxycycline and a cough medicine. He comes in tonight because her cough is severe he is feeling short of breath. She denies any abdominal pain, nausea or vomiting. She denies any chest pain. She did receive a flu shot.  The history is provided by the patient.    Past Medical History  Diagnosis Date  . Unspecified hypothyroidism   . Cough   . Acute bronchitis   . Obstructive sleep apnea (adult) (pediatric)   . Coronary atherosclerosis   . Type II or unspecified type diabetes mellitus without mention of complication, not stated as uncontrolled    Past Surgical History  Procedure Laterality Date  . Tubal ligation    . Anterior fusion cervical spine     Family History  Problem Relation Age of Onset  . Hypertension Mother   . Heart disease Mother   . Prostate cancer Father   . Heart disease Brother    History  Substance Use Topics  . Smoking status: Former Smoker -- 0.10 packs/day for 30 years    Quit date: 08/09/2003  . Smokeless tobacco: Never Used  . Alcohol Use: No   OB History   Grav Para Term Preterm Abortions TAB SAB Ect Mult Living                 Review of Systems  Constitutional: Positive for fever, activity change, appetite change and fatigue.  HENT: Positive for congestion, rhinorrhea and sore throat.   Respiratory: Positive for cough, chest tightness and shortness  of breath.   Cardiovascular: Positive for chest pain.  Gastrointestinal: Negative for nausea, vomiting and abdominal pain.  Genitourinary: Negative for dysuria and hematuria.  Musculoskeletal: Positive for arthralgias and myalgias.  Skin: Negative for rash.  Neurological: Positive for weakness. Negative for dizziness and headaches.  A complete 10 system review of systems was obtained and all systems are negative except as noted in the HPI and PMH.    Allergies  Codeine; Sulfonamide derivatives; and Sulfa antibiotics  Home Medications   Current Outpatient Rx  Name  Route  Sig  Dispense  Refill  . ADVAIR DISKUS 250-50 MCG/DOSE AEPB      INHALE ONE (1) PUFF(S) TWICE DAILY RINSE MOUTH   60 each   5     CYCLE FILL MEDICATION. Authorization is required f ...   . albuterol (PROVENTIL HFA) 108 (90 BASE) MCG/ACT inhaler   Inhalation   Inhale 2 puffs into the lungs every 6 (six) hours as needed for wheezing or shortness of breath. For shortness of breath and wheezing   1 Inhaler   prn   . albuterol (PROVENTIL HFA;VENTOLIN HFA) 108 (90 BASE) MCG/ACT inhaler   Inhalation   Inhale 1-2 puffs into the lungs every 6 (six) hours as needed for wheezing or shortness of breath.   1 Inhaler   0   . albuterol (PROVENTIL) (2.5 MG/3ML) 0.083% nebulizer solution   Nebulization   Take 3 mLs (2.5  mg total) by nebulization every 6 (six) hours as needed for wheezing or shortness of breath.   75 mL   12   . atorvastatin (LIPITOR) 80 MG tablet   Oral   Take 80 mg by mouth daily.         . diazepam (VALIUM) 5 MG tablet   Oral   Take 0.5 tablets by mouth as needed. .5 to 1 tablet prn for muscle cramps         . diclofenac (VOLTAREN) 75 MG EC tablet   Oral   Take 75 mg by mouth daily as needed. For muscle aches         . doxycycline (VIBRA-TABS) 100 MG tablet      Take 2 today then one daily until gone   8 tablet   0   . esomeprazole (NEXIUM) 40 MG capsule   Oral   Take 40 mg by  mouth daily before breakfast.           . furosemide (LASIX) 40 MG tablet   Oral   Take 40 mg by mouth daily as needed. For fluid retention         . guaiFENesin (MUCINEX) 600 MG 12 hr tablet   Oral   Take 1,200 mg by mouth 2 (two) times daily as needed.          Marland Kitchen HYDROcodone-homatropine (HYDROMET) 5-1.5 MG/5ML syrup   Oral   Take 5 mLs by mouth every 6 (six) hours as needed for cough.   240 mL   0   . insulin glargine (LANTUS) 100 UNIT/ML injection   Subcutaneous   Inject into the skin at bedtime. 15 units at night x 10 wks.         . INVOKANA 100 MG TABS   Oral   Take 100 mg by mouth daily before breakfast.          . isosorbide mononitrate (IMDUR) 120 MG 24 hr tablet   Oral   Take 1 tablet (120 mg total) by mouth daily.   30 tablet   11   . levothyroxine (SYNTHROID, LEVOTHROID) 200 MCG tablet   Oral   Take 200 mcg by mouth daily.         . metFORMIN (GLUMETZA) 1000 MG (MOD) 24 hr tablet   Oral   Take 1,000 mg by mouth 2 (two) times daily with a meal.           . metoprolol succinate (TOPROL-XL) 25 MG 24 hr tablet   Oral   Take 25 mg by mouth daily.           Marland Kitchen NOVOTWIST 32G X 5 MM MISC      Use as directed         . omega-3 acid ethyl esters (LOVAZA) 1 G capsule   Oral   Take 2 capsules (2 g total) by mouth 2 (two) times daily.   30 capsule   11   . ONE TOUCH ULTRA TEST test strip      Use as directed         . triamterene-hydrochlorothiazide (DYAZIDE) 37.5-25 MG per capsule   Oral   Take 1 capsule by mouth every morning.           . verapamil (COVERA HS) 240 MG (CO) 24 hr tablet   Oral   Take 240 mg by mouth daily.           BP 127/68  Pulse 103  Temp(Src) 99.7 F (37.6 C) (Oral)  Resp 20  Ht 5\' 2"  (1.575 m)  Wt 172 lb (78.019 kg)  BMI 31.45 kg/m2  SpO2 97% Physical Exam  Constitutional: She is oriented to person, place, and time. She appears well-developed and well-nourished. No distress.  HENT:  Head:  Normocephalic and atraumatic.  Mouth/Throat: Oropharynx is clear and moist. No oropharyngeal exudate.  Eyes: Conjunctivae and EOM are normal. Pupils are equal, round, and reactive to light.  Neck: Normal range of motion. Neck supple.  No meningismus  Cardiovascular: Normal rate, regular rhythm, normal heart sounds and intact distal pulses.   No murmur heard. Pulmonary/Chest: Effort normal. No respiratory distress. She has wheezes.  Abdominal: Soft. Bowel sounds are normal. There is no tenderness. There is no rebound and no guarding.  Musculoskeletal: Normal range of motion. She exhibits no edema and no tenderness.  Neurological: She is alert and oriented to person, place, and time. No cranial nerve deficit. Coordination normal.  Skin: Skin is warm.    ED Course  Procedures (including critical care time) Labs Review Labs Reviewed  CBC WITH DIFFERENTIAL - Abnormal; Notable for the following:    Hemoglobin 11.7 (*)    MCV 77.8 (*)    MCH 24.7 (*)    RDW 17.0 (*)    All other components within normal limits  COMPREHENSIVE METABOLIC PANEL - Abnormal; Notable for the following:    Glucose, Bld 114 (*)    Creatinine, Ser 1.20 (*)    Total Protein 8.8 (*)    GFR calc non Af Amer 49 (*)    GFR calc Af Amer 57 (*)    All other components within normal limits  LIPASE, BLOOD  TROPONIN I   Imaging Review Dg Chest 2 View  09/02/2013   CLINICAL DATA:  Wheezing and cough.  EXAM: CHEST  2 VIEW  COMPARISON:  PA and lateral chest 05/11/2012 and 08/27/2011.  FINDINGS: The lungs are clear. Heart size is normal. No pneumothorax or pleural effusion. The patient is status post lower cervical fusion.  IMPRESSION: No acute disease.   Electronically Signed   By: Inge Rise M.D.   On: 09/02/2013 20:28    EKG Interpretation    Date/Time:  Monday September 02 2013 19:38:00 EST Ventricular Rate:  82 PR Interval:  146 QRS Duration: 80 QT Interval:  370 QTC Calculation: 432 R Axis:   56 Text  Interpretation:  Normal sinus rhythm Nonspecific T wave abnormality Abnormal ECG No significant change was found Confirmed by Wyvonnia Dusky  MD, Drue Camera ([id number]) on 09/02/2013 7:44:08 PM            MDM   1. Bronchitis    Two-day history of body aches, congestion, shortness of breath, abdominal soreness from coughing. Subjective fevers. Called pulmonologist today and started on doxycycline and cough medicine.  Nebs given. CXR negative. Lab unremarkable, EKG unchanged. Ambulatory, maintaining saturations >97%.   Not wheezing, will not start steroids.  Likely viral syndrome, but continue antibiotics prescribed by Dr. Annamaria Boots. Return precautions discussed.    Ezequiel Essex, MD 09/02/13 2108

## 2013-09-02 NOTE — Discharge Instructions (Signed)

## 2013-09-02 NOTE — Telephone Encounter (Signed)
Hydromet rx signed by CDY and placed up front in brown folder.

## 2013-09-02 NOTE — ED Notes (Signed)
Pt c/o flu like symptoms  x 2 days

## 2013-09-02 NOTE — Telephone Encounter (Signed)
Spoke with pt.a ware of recs. She will pick up cough syrup RX from office. Nothing further needed

## 2013-09-02 NOTE — Telephone Encounter (Signed)
Ok doxycycline 100 mg, # 8, 2 today then one daily Ok refill hydromet

## 2013-09-02 NOTE — Telephone Encounter (Signed)
Spoke with pt. C/o heavy prod cough w/ yellow-green phlem and wheezing x couple days. No increase SOB, no chest tx. She is using her inhalers. Pt also is requesting to have cough syrup called in for her. Hydromet last refilled 06/24/13 #240 ML Please advise Dr. Annamaria Boots thanks Last OV 06/18/13  Allergies  Allergen Reactions  . Codeine     REACTION: hallucinations/"loopy"  . Sulfonamide Derivatives Hives  . Sulfa Antibiotics Hives    Current Outpatient Prescriptions on File Prior to Visit  Medication Sig Dispense Refill  . ADVAIR DISKUS 250-50 MCG/DOSE AEPB INHALE ONE (1) PUFF(S) TWICE DAILY RINSE MOUTH  60 each  5  . albuterol (PROVENTIL HFA) 108 (90 BASE) MCG/ACT inhaler Inhale 2 puffs into the lungs every 6 (six) hours as needed for wheezing or shortness of breath. For shortness of breath and wheezing  1 Inhaler  prn  . albuterol (PROVENTIL) (2.5 MG/3ML) 0.083% nebulizer solution Take 3 mLs (2.5 mg total) by nebulization every 6 (six) hours as needed for wheezing or shortness of breath.  75 mL  12  . atorvastatin (LIPITOR) 80 MG tablet Take 80 mg by mouth daily.      . diazepam (VALIUM) 5 MG tablet Take 0.5 tablets by mouth as needed. .5 to 1 tablet prn for muscle cramps      . diclofenac (VOLTAREN) 75 MG EC tablet Take 75 mg by mouth daily as needed. For muscle aches      . esomeprazole (NEXIUM) 40 MG capsule Take 40 mg by mouth daily before breakfast.        . furosemide (LASIX) 40 MG tablet Take 40 mg by mouth daily as needed. For fluid retention      . guaiFENesin (MUCINEX) 600 MG 12 hr tablet Take 1,200 mg by mouth 2 (two) times daily as needed.       Marland Kitchen HYDROcodone-homatropine (HYDROMET) 5-1.5 MG/5ML syrup Take 5 mLs by mouth every 6 (six) hours as needed for cough.  240 mL  0  . insulin glargine (LANTUS) 100 UNIT/ML injection Inject into the skin at bedtime. 15 units at night x 10 wks.      . INVOKANA 100 MG TABS Take 100 mg by mouth daily before breakfast.       . isosorbide  mononitrate (IMDUR) 120 MG 24 hr tablet Take 1 tablet (120 mg total) by mouth daily.  30 tablet  11  . levothyroxine (SYNTHROID, LEVOTHROID) 200 MCG tablet Take 200 mcg by mouth daily.      . metFORMIN (GLUMETZA) 1000 MG (MOD) 24 hr tablet Take 1,000 mg by mouth 2 (two) times daily with a meal.        . metoprolol succinate (TOPROL-XL) 25 MG 24 hr tablet Take 25 mg by mouth daily.        Marland Kitchen NOVOTWIST 32G X 5 MM MISC Use as directed      . omega-3 acid ethyl esters (LOVAZA) 1 G capsule Take 2 capsules (2 g total) by mouth 2 (two) times daily.  30 capsule  11  . ONE TOUCH ULTRA TEST test strip Use as directed      . triamterene-hydrochlorothiazide (DYAZIDE) 37.5-25 MG per capsule Take 1 capsule by mouth every morning.        . verapamil (COVERA HS) 240 MG (CO) 24 hr tablet Take 240 mg by mouth daily.        No current facility-administered medications on file prior to visit.

## 2013-09-25 ENCOUNTER — Other Ambulatory Visit: Payer: Self-pay | Admitting: *Deleted

## 2013-09-25 ENCOUNTER — Telehealth: Payer: Self-pay | Admitting: Internal Medicine

## 2013-09-25 DIAGNOSIS — I10 Essential (primary) hypertension: Secondary | ICD-10-CM

## 2013-09-25 MED ORDER — OMEGA-3-ACID ETHYL ESTERS 1 G PO CAPS: 2.0000 g | ORAL_CAPSULE | Freq: Two times a day (BID) | ORAL | Status: DC

## 2013-09-25 MED ORDER — DOXYCYCLINE HYCLATE 100 MG PO TABS: ORAL_TABLET | ORAL | Status: DC

## 2013-09-25 NOTE — Telephone Encounter (Signed)
Called spoke with patient who c/o sinus congestion with dark green mucus, wheezing, some chest congestion, PND, throat and both ears itching x5 days.  Pt has been taking plain mucinex and alka-seltzer.  Pt denies any f/c/s, dyspnea, chest tightness, hemoptysis, nausea, vomiting.  Pt is requesting an antibiotic and nasal spray > generic preferably. Dr Annamaria Boots please advise, thank you. [place] in [employer] Last ov w/ CDY 11.11.14  Allergies  Allergen Reactions  . Codeine     REACTION: hallucinations/"loopy"  . Sulfonamide Derivatives Hives  . Sulfa Antibiotics Hives   Current Outpatient Prescriptions on File Prior to Visit  Medication Sig Dispense Refill  . ADVAIR DISKUS 250-50 MCG/DOSE AEPB INHALE ONE (1) PUFF(S) TWICE DAILY RINSE MOUTH  60 each  5  . albuterol (PROVENTIL HFA) 108 (90 BASE) MCG/ACT inhaler Inhale 2 puffs into the lungs every 6 (six) hours as needed for wheezing or shortness of breath. For shortness of breath and wheezing  1 Inhaler  prn  . albuterol (PROVENTIL HFA;VENTOLIN HFA) 108 (90 BASE) MCG/ACT inhaler Inhale 1-2 puffs into the lungs every 6 (six) hours as needed for wheezing or shortness of breath.  1 Inhaler  0  . albuterol (PROVENTIL) (2.5 MG/3ML) 0.083% nebulizer solution Take 3 mLs (2.5 mg total) by nebulization every 6 (six) hours as needed for wheezing or shortness of breath.  75 mL  12  . atorvastatin (LIPITOR) 80 MG tablet Take 80 mg by mouth daily.      . diazepam (VALIUM) 5 MG tablet Take 0.5 tablets by mouth as needed. .5 to 1 tablet prn for muscle cramps      . diclofenac (VOLTAREN) 75 MG EC tablet Take 75 mg by mouth daily as needed. For muscle aches      . doxycycline (VIBRA-TABS) 100 MG tablet Take 2 today then one daily until gone  8 tablet  0  . esomeprazole (NEXIUM) 40 MG capsule Take 40 mg by mouth daily before breakfast.        . furosemide (LASIX) 40 MG tablet Take 40 mg by mouth daily as needed. For fluid retention      . guaiFENesin  (MUCINEX) 600 MG 12 hr tablet Take 1,200 mg by mouth 2 (two) times daily as needed.       Marland Kitchen HYDROcodone-homatropine (HYDROMET) 5-1.5 MG/5ML syrup Take 5 mLs by mouth every 6 (six) hours as needed for cough.  240 mL  0  . insulin glargine (LANTUS) 100 UNIT/ML injection Inject into the skin at bedtime. 15 units at night x 10 wks.      . INVOKANA 100 MG TABS Take 100 mg by mouth daily before breakfast.       . isosorbide mononitrate (IMDUR) 120 MG 24 hr tablet Take 1 tablet (120 mg total) by mouth daily.  30 tablet  11  . levothyroxine (SYNTHROID, LEVOTHROID) 200 MCG tablet Take 200 mcg by mouth daily.      . metFORMIN (GLUMETZA) 1000 MG (MOD) 24 hr tablet Take 1,000 mg by mouth 2 (two) times daily with a meal.        . metoprolol succinate (TOPROL-XL) 25 MG 24 hr tablet Take 25 mg by mouth daily.        Marland Kitchen NOVOTWIST 32G X 5 MM MISC Use as directed      . omega-3 acid ethyl esters (LOVAZA) 1 G capsule Take 2 capsules (2 g total) by mouth 2 (two) times daily.  30 capsule  11  . ONE  TOUCH ULTRA TEST test strip Use as directed      . triamterene-hydrochlorothiazide (DYAZIDE) 37.5-25 MG per capsule Take 1 capsule by mouth every morning.        . verapamil (COVERA HS) 240 MG (CO) 24 hr tablet Take 240 mg by mouth daily.        No current facility-administered medications on file prior to visit.

## 2013-09-25 NOTE — Telephone Encounter (Signed)
Rx was sent to pharmacy electronically. 

## 2013-09-25 NOTE — Telephone Encounter (Signed)
Per CY called in Doxy 100mg  X8, 2 po today, 1 po X7 days, and OTC nasacort spray.  Doxy has been called in. Pt aware, nothing further needed.

## 2013-12-09 ENCOUNTER — Ambulatory Visit: Payer: 59 | Admitting: Internal Medicine

## 2014-01-03 ENCOUNTER — Other Ambulatory Visit: Payer: Self-pay | Admitting: Cardiovascular Disease

## 2014-01-03 NOTE — Telephone Encounter (Signed)
Rx was sent to pharmacy electronically. 

## 2014-01-10 ENCOUNTER — Telehealth: Payer: Self-pay | Admitting: Internal Medicine

## 2014-01-10 MED ORDER — FLUCONAZOLE 150 MG PO TABS: 150.0000 mg | ORAL_TABLET | Freq: Every day | ORAL | Status: DC

## 2014-01-10 NOTE — Telephone Encounter (Signed)
LMTCB

## 2014-01-10 NOTE — Telephone Encounter (Signed)
Don't know what the UC gave for thrush. Offer diflucan 150 mg, # 7, 1 daily

## 2014-01-10 NOTE — Telephone Encounter (Signed)
Pt called back and she stated that she is still sick with sinus infection and thrush.  Pt stated that she has been seen by UC x 2 and has been treated for the sinus infection and thrush but is still not cleared up.  Pt stated that she has white patches on her throat and she has a sore throat.  Pt has had this since May 25 th.  Pt is also having some wheezing at times.  Denies any fever or body aches.  Pt is requesting that something be called to the pharmacy for this.  CY please advise. Thanks  Last ov--06/2013 Next ov--01/23/14  Allergies  Allergen Reactions  . Codeine     REACTION: hallucinations/"loopy"  . Sulfonamide Derivatives Hives  . Sulfa Antibiotics Hives     Current Outpatient Prescriptions on File Prior to Visit  Medication Sig Dispense Refill  . ADVAIR DISKUS 250-50 MCG/DOSE AEPB INHALE ONE (1) PUFF(S) TWICE DAILY RINSE MOUTH  60 each  5  . albuterol (PROVENTIL HFA) 108 (90 BASE) MCG/ACT inhaler Inhale 2 puffs into the lungs every 6 (six) hours as needed for wheezing or shortness of breath. For shortness of breath and wheezing  1 Inhaler  prn  . albuterol (PROVENTIL HFA;VENTOLIN HFA) 108 (90 BASE) MCG/ACT inhaler Inhale 1-2 puffs into the lungs every 6 (six) hours as needed for wheezing or shortness of breath.  1 Inhaler  0  . albuterol (PROVENTIL) (2.5 MG/3ML) 0.083% nebulizer solution Take 3 mLs (2.5 mg total) by nebulization every 6 (six) hours as needed for wheezing or shortness of breath.  75 mL  12  . atorvastatin (LIPITOR) 80 MG tablet Take 80 mg by mouth daily.      . diazepam (VALIUM) 5 MG tablet Take 0.5 tablets by mouth as needed. .5 to 1 tablet prn for muscle cramps      . diclofenac (VOLTAREN) 75 MG EC tablet Take 75 mg by mouth daily as needed. For muscle aches      . doxycycline (VIBRA-TABS) 100 MG tablet Take 2 today then one daily until gone  8 tablet  0  . doxycycline (VIBRA-TABS) 100 MG tablet 2 po today, then 1 po qd.  8 tablet  0  . esomeprazole (NEXIUM) 40  MG capsule Take 40 mg by mouth daily before breakfast.        . furosemide (LASIX) 40 MG tablet Take 40 mg by mouth daily as needed. For fluid retention      . guaiFENesin (MUCINEX) 600 MG 12 hr tablet Take 1,200 mg by mouth 2 (two) times daily as needed.       Marland Kitchen HYDROcodone-homatropine (HYDROMET) 5-1.5 MG/5ML syrup Take 5 mLs by mouth every 6 (six) hours as needed for cough.  240 mL  0  . insulin glargine (LANTUS) 100 UNIT/ML injection Inject into the skin at bedtime. 15 units at night x 10 wks.      . INVOKANA 100 MG TABS Take 100 mg by mouth daily before breakfast.       . isosorbide mononitrate (IMDUR) 120 MG 24 hr tablet Take 1 tablet (120 mg total) by mouth daily.  30 tablet  11  . levothyroxine (SYNTHROID, LEVOTHROID) 200 MCG tablet Take 200 mcg by mouth daily.      . metFORMIN (GLUMETZA) 1000 MG (MOD) 24 hr tablet Take 1,000 mg by mouth 2 (two) times daily with a meal.        . metoprolol succinate (TOPROL-XL) 25 MG 24 hr tablet  Take 25 mg by mouth daily.        Marland Kitchen NOVOTWIST 32G X 5 MM MISC Use as directed      . omega-3 acid ethyl esters (LOVAZA) 1 G capsule TAKE 2 CAPSULES (2 G TOTAL) BY MOUTH 2 (TWO) TIMES DAILY.  120 capsule  7  . ONE TOUCH ULTRA TEST test strip Use as directed      . triamterene-hydrochlorothiazide (DYAZIDE) 37.5-25 MG per capsule Take 1 capsule by mouth every morning.        . verapamil (COVERA HS) 240 MG (CO) 24 hr tablet Take 240 mg by mouth daily.        No current facility-administered medications on file prior to visit.

## 2014-01-10 NOTE — Telephone Encounter (Signed)
Called spoke with pt. She is aware recs. RX sent into [facility]. Nothing further needed

## 2014-01-10 NOTE — Telephone Encounter (Signed)
Pt returned call.  Pt is currently on the bus & believes around 5:00 PM will be the best time to call her back.  [name]

## 2014-01-23 ENCOUNTER — Encounter: Payer: Self-pay | Admitting: Internal Medicine

## 2014-01-23 ENCOUNTER — Ambulatory Visit (INDEPENDENT_AMBULATORY_CARE_PROVIDER_SITE_OTHER): Payer: 59 | Admitting: Internal Medicine

## 2014-01-23 VITALS — BP 128/76 | HR 83 | Ht 62.0 in | Wt 166.0 lb

## 2014-01-23 DIAGNOSIS — G4733 Obstructive sleep apnea (adult) (pediatric): Secondary | ICD-10-CM

## 2014-01-23 DIAGNOSIS — J3089 Other allergic rhinitis: Secondary | ICD-10-CM

## 2014-01-23 DIAGNOSIS — J302 Other seasonal allergic rhinitis: Secondary | ICD-10-CM

## 2014-01-23 DIAGNOSIS — J309 Allergic rhinitis, unspecified: Secondary | ICD-10-CM

## 2014-01-23 DIAGNOSIS — J019 Acute sinusitis, unspecified: Secondary | ICD-10-CM

## 2014-01-23 MED ORDER — FLUCONAZOLE 150 MG PO TABS: 150.0000 mg | ORAL_TABLET | Freq: Every day | ORAL | Status: DC

## 2014-01-23 MED ORDER — PHENYLEPHRINE HCL 1 % NA SOLN
1.0000 [drp] | Freq: Once | NASAL | Status: AC
  Administered 2014-01-23: 1 [drp] via NASAL

## 2014-01-23 MED ORDER — CLARITHROMYCIN 500 MG PO TABS: ORAL_TABLET | ORAL | Status: DC

## 2014-01-23 NOTE — Patient Instructions (Addendum)
Script for biaxin antibiotic sent  Script for diflucan sent  Neb neo nasal  Ok to continue the nasal saline rinse and the decongestant as neded

## 2014-01-23 NOTE — Progress Notes (Signed)
Patient ID: [name], female    DOB: 1956/07/12, 58 y.o.   MRN: [id number]  HPI 11/08/10- 32 yo F former smoker, followed here for obstructive sleep apnea and for hx bronchitis.  She was recently treated by her PCP for acute bronchitis. Onset after mowing grass. Still coughing up thick greenish yellow mucus after augmentin they gave her. She thinks she does better with doxycycline. Denies fever, swollen glands, sore throat.  Last seen here May 10, 2010. Hx of allergy testing but never on vaccine. Pollen is bothering with nasal congestion and wheezey cough more this year.  Continues CPAP at 13 cwp all night every night.  Sleeps ok. She asks about alternatives and we discussed palatal surgeries, oral appliances and Provent nasal valves. [name] Twin sister had MI.  05/10/11- 18 yo F former smoker, followed here for obstructive sleep apnea and for hx bronchitis, allergic rhinitis, complicated by DM, hypothyroid, CAD  Since last here, Dr. Claiborne Billings placed a coronary stent. 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  CPAP compliance is good all night every night. She isn't sure about mask and we discussed going to [facility] to look at alternatives.  Wants flu vac. She still feels tight in the chest that she continues Advair 250. Some wheeze. Uses rescue inhaler and asks about getting a nebulizer machine. We had called in a Z-Pak which she says was not strong enough. She denies discolored sputum or fever. We discussed using cortisone and anticipated impact on her insulin-dependent diabetes temporarily. She complains of a "raw" substernal sensation but thinks Nexium controls her reflux well.  08/29/11-  27 yo F former smoker, followed here for obstructive sleep apnea and for hx bronchitis, allergic rhinitis, complicated by DM, hypothyroid, CAD  Acute visit-  deep cough-yellow in color and streaks of blood at times; SOB and wheezing-went to ER Saturday([facility])Lower  back pain  she did have flu vaccine. Last week had GI upset with vomiting and right upper quadrant pain. She still has her gallbladder. That discomfort has eased off. 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 As her GI upset resolved, she developed chest tightness with cough and cold symptoms, fever and chills 3 days ago now resolved. Head and chest are still congested. [facility] his nebulizer treatment x2 started prednisone taper. She  Is finishing doxycycline with 2 days left. CHEST - 2 VIEW 08/27/11-  Comparison: 05/10/2011  Findings: Mild peribronchial thickening. Heart and mediastinal  contours are within normal limits. No focal opacities or  effusions. No acute bony abnormality.  IMPRESSION:  Mild bronchitic changes.  Original Report Authenticated By: [name], M.D.   11/08/11- 33 yo F former smoker, followed here for obstructive sleep apnea and for hx bronchitis, allergic rhinitis, complicated by DM, hypothyroid, CAD  Continues using CPAP 13 almost every night. No sleeping pills. Nebulizer treatment and Depo-Medrol did help her breathing last visit. She would like a home nebulizer which we discussed. Singulair did not help. Blames her cough on postnasal drip or getting too hot. Has history of occasional thrush which might also contribute to throat discomfort.   05/11/2012 Acute OV  Complains of head congestion w/ green drainage, PND, prod cough with gree mucus, hoarseness, increased SOB, wheezing, some tightness x2 weeks - denies f/c/s.  has finished abx and pred thru PCP w/in last 2 weeks Took keflex and steroid taper with minimal  improvement. Has sinus congestion, ear fullness, hoarseness and drainage.  No hemotpysis or chest pain  No edema.  OTC not helping with cough.   06/29/12- 62 yo F former smoker, followed here for obstructive sleep apnea and for hx bronchitis, allergic rhinitis, complicated by DM, hypothyroid,  CAD  FOLLOWS FOR: still having cough and feels fullness in chest coming back since seeign TP.; discomfort on right side lung area CPAP 13 Feeling better after she was seen by the nurse practitioner October 4. Now has caught a new cold. Sneezing. Hard cough caused sharp pain right lateral rib. Area still hurts to cough. Scant phlegm, mild sore throat, no fever but did have some chills originally. Took Augmentin then amoxicillin used for dental prophylaxis. CXR 05/11/12-reviewed IMPRESSION:  No acute cardiopulmonary abnormality.  Original Report Authenticated By: [name], M.D.   11/06/12- 76 yo F former smoker, followed here for obstructive sleep apnea and for hx bronchitis, allergic rhinitis, complicated by DM, hypothyroid, CAD , OSA FOLLOW FOR:c/o sinus congestion,chest tightness x 4 days,mild sob w exertion,,cough and nasal congestion yellow,,right ear stopped up and hurting,Finished mouthwash that was called in 2 wks. Ago Eyes itch, postnasal drip, malaise x 4 days. Amoxacillin 2 weeks ago for ?strep throat. Diazepam for cramps.  CPAP 13/ [facility].   06/18/13- 77 yo F former smoker, followed here for obstructive sleep apnea and for hx bronchitis, allergic rhinitis, complicated by DM, hypothyroid, CAD , OSA FOLLOWS FOR: Breathing is slightly improved. Reports SOB and wheezing. Denies chest tightness today. Is producing green mucus from nose. Denies fever or sore throat. Using Mucinex and saline rinse. Had flu vaccine. CPAP 13/ [facility] is not being used as much as she should. Using Nexium, feels no reflux or heartburn  01/23/14- 66 yo F former smoker, followed here for obstructive sleep apnea and for hx bronchitis, allergic rhinitis, complicated by DM, hypothyroid, CAD , OSA FOLLOWS FOR:  Having increased sob with exertion, wheezing and cough with yellow mucus x1 month.  Pt has done round of anitbiotics and pred shot twice. CPAP/13/[facility] Onset of sinusitis in March with extension to  chest/bronchitis. Urgent care treated with steroid shot, antibiotic shot and then cephalosporin. She started itching attributed to the antibiotic. Chest x-ray was clear. She was given repeat steroid and antibiotic injections. Still blowing green from her nose. Using Sudafed and saline nasal rinse.no fever or headache recognized CXR 09/02/13 IMPRESSION:  No acute disease.  Electronically Signed  By: Inge Rise M.D.  On: 09/02/2013 20:28  ROS-see HPI Constitutional:   No-   weight loss, night sweats, fevers, chills, fatigue, lassitude. HEENT:   No-  headaches, difficulty swallowing, tooth/dental problems, sore throat,      +sneezing, itching, ear ache, +nasal congestion, post nasal drip,  CV:  No- chest pain, no-orthopnea, PND, swelling in lower extremities, anasarca,  dizziness, palpitations Resp: No-   shortness of breath with exertion or at rest.              No-  productive cough,  + non-productive cough,  No- coughing up of blood.              +  change in color of mucus.  No- wheezing.   Skin: No-   rash or lesions. GI:  No-   heartburn, indigestion, abdominal pain, nausea, vomiting,  GU: . MS:  No-   joint pain or swelling.  . Neuro-     nothing unusual Psych:  No- change in mood or affect. No  depression or anxiety.  No memory loss.  Objective:   Physical Exam OBJ- Physical Exam General- Alert, Oriented, Affect-appropriate, Distress- none acute Skin- rash-none, lesions- none, excoriation- none Lymphadenopathy- none Head- atraumatic            Eyes- + periorbital edema, Gross vision intact, PERRLA, conjunctivae and secretions clear            Ears- Hearing, canals-normal, +R TM retracted            Nose- + turb edema,  no-Septal dev, mucus, polyps, erosion, perforation             Throat- Mallampati II , mucosa clear , drainage- none, tonsils- atrophic Neck- flexible , trachea midline, no stridor , thyroid nl, carotid no bruit Chest - symmetrical excursion , unlabored            Heart/CV- RRR , no murmur , no gallop  , no rub, nl s1 s2                           - JVD- none , edema- none, stasis changes- none, varices- none           Lung-  No-wheeze-, no- cough , dullness-none, rub- none           Chest wall-  Abd-  Br/ Gen/ Rectal- Not done, not indicated Extrem- cyanosis- none, clubbing, none, atrophy- none, strength- nl Neuro- grossly intact to observation

## 2014-02-06 ENCOUNTER — Other Ambulatory Visit: Payer: Self-pay | Admitting: Internal Medicine

## 2014-02-18 ENCOUNTER — Other Ambulatory Visit (HOSPITAL_BASED_OUTPATIENT_CLINIC_OR_DEPARTMENT_OTHER): Payer: Self-pay | Admitting: Endocrinology

## 2014-02-18 ENCOUNTER — Telehealth: Payer: Self-pay | Admitting: Cardiovascular Disease

## 2014-02-18 DIAGNOSIS — Z1231 Encounter for screening mammogram for malignant neoplasm of breast: Secondary | ICD-10-CM

## 2014-02-18 NOTE — Telephone Encounter (Signed)
Spoke to patient. She states she not having any epsiode at present. She is aware of how to use NTG. She is aware if NTG x 3 is used call 911. Or syptoms not relieved by NTG go to ER. Patient wanted to know if Dr Claiborne Billings need to see her or an extender att present time.  RN informed patient will inform Dr Claiborne Billings - future appointmentt made.

## 2014-02-18 NOTE — Telephone Encounter (Signed)
Pt wanted you to know she have been having chest pain that radiates all way to her back bone. She saw Dr Forde Dandy today,he gave her nitroglycerin.She have not had to take any.

## 2014-02-19 NOTE — Telephone Encounter (Signed)
agree

## 2014-02-24 ENCOUNTER — Ambulatory Visit (HOSPITAL_BASED_OUTPATIENT_CLINIC_OR_DEPARTMENT_OTHER)
Admission: RE | Admit: 2014-02-24 | Discharge: 2014-02-24 | Disposition: A | Discharge status: Home / Self Care | Payer: 59 | Source: Ambulatory Visit | Attending: Endocrinology | Admitting: Endocrinology

## 2014-02-24 DIAGNOSIS — Z1231 Encounter for screening mammogram for malignant neoplasm of breast: Secondary | ICD-10-CM | POA: Insufficient documentation

## 2014-04-03 ENCOUNTER — Other Ambulatory Visit: Payer: Self-pay | Admitting: Internal Medicine

## 2014-04-06 NOTE — Assessment & Plan Note (Signed)
Acute maxillary sinusitis and bronchitis. She may have had a rash earlier, secondary to cephalosporin Plan-nasal nebulizer decongestion, Biaxin

## 2014-04-06 NOTE — Assessment & Plan Note (Signed)
Reminded to importance of long-term compliance.

## 2014-05-06 ENCOUNTER — Encounter: Payer: Self-pay | Admitting: Cardiovascular Disease

## 2014-05-06 ENCOUNTER — Ambulatory Visit (INDEPENDENT_AMBULATORY_CARE_PROVIDER_SITE_OTHER): Payer: 59 | Admitting: Cardiovascular Disease

## 2014-05-06 VITALS — BP 140/74 | HR 61 | Ht 62.0 in | Wt 166.4 lb

## 2014-05-06 DIAGNOSIS — R079 Chest pain, unspecified: Secondary | ICD-10-CM

## 2014-05-06 DIAGNOSIS — I209 Angina pectoris, unspecified: Secondary | ICD-10-CM

## 2014-05-06 DIAGNOSIS — E039 Hypothyroidism, unspecified: Secondary | ICD-10-CM

## 2014-05-06 DIAGNOSIS — E785 Hyperlipidemia, unspecified: Secondary | ICD-10-CM

## 2014-05-06 DIAGNOSIS — R071 Chest pain on breathing: Secondary | ICD-10-CM

## 2014-05-06 DIAGNOSIS — R0602 Shortness of breath: Secondary | ICD-10-CM

## 2014-05-06 DIAGNOSIS — I25119 Atherosclerotic heart disease of native coronary artery with unspecified angina pectoris: Secondary | ICD-10-CM

## 2014-05-06 DIAGNOSIS — R0789 Other chest pain: Secondary | ICD-10-CM

## 2014-05-06 DIAGNOSIS — I10 Essential (primary) hypertension: Secondary | ICD-10-CM

## 2014-05-06 DIAGNOSIS — E119 Type 2 diabetes mellitus without complications: Secondary | ICD-10-CM

## 2014-05-06 DIAGNOSIS — E782 Mixed hyperlipidemia: Secondary | ICD-10-CM

## 2014-05-06 DIAGNOSIS — I2510 Atherosclerotic heart disease of native coronary artery without angina pectoris: Secondary | ICD-10-CM

## 2014-05-06 NOTE — Patient Instructions (Signed)
Your physician has requested that you have en exercise stress myoview. For further information please visit [email or web address]. Please follow instruction sheet, as given.  Your physician recommends that you return for lab work fasting.  Your physician recommends that you schedule a follow-up appointment in: 6 weeks.

## 2014-05-06 NOTE — Progress Notes (Signed)
Patient ID: [name], female   DOB: 1956-07-29, 58 y.o.   MRN: [id number]     HPI: [name] is a 58 y.o. female who presents to the office for a 10 month cardiology evaluation.  Chief complaint is that occasional episodes of chest discomfort.  Ms Ramsaroop has known coronary artery disease and in 2007 underwent insertion of a 3.0x13 mm Cypher DES stent to her proximal RCA. She did have concomitant CAD with 60-70% stenosis of her LAD and 20% circumflex stenosis. In July 2010 she develop recurrent chest pain and repeat catheterization showed 60% LAD stenosis, 20-30% circumflex stenosis and a widely patent RCA stent.  She has a history of hypertension, hyperlipidemia, hypothyroidism, diabetes mellitus, asthma, GERD, as well as intermittent leg swelling.  When I saw her last December, 2014, she had experienced some episodes of chest pain, which sounded musculoskeletal.  However, with her cardiac risk factor profile, and known CAD.  I did recommend a nuclear stress test.  She never had this done.  She does take Voltaren but still experiences episodes of throbbing-like chest discomfort with some back radiation.  She also has noticed some increasing shortness of breath with activity, particularly with walking up steps.  She now presents for evaluation.  Past Medical History  Diagnosis Date  . Unspecified hypothyroidism   . Cough   . Acute bronchitis   . Obstructive sleep apnea (adult) (pediatric)   . Coronary atherosclerosis   . Type II or unspecified type diabetes mellitus without mention of complication, not stated as uncontrolled     Past Surgical History  Procedure Laterality Date  . Tubal ligation    . Anterior fusion cervical spine      Allergies  Allergen Reactions  . Codeine     REACTION: hallucinations/"loopy"  . Sulfonamide Derivatives Hives  . Sulfa Antibiotics Hives    Current Outpatient Prescriptions  Medication Sig Dispense Refill  . ADVAIR DISKUS 250-50  MCG/DOSE AEPB INHALE ONE (1) PUFF(S) TWICE DAILY RINSE MOUTH  60 each  4  . albuterol (PROVENTIL HFA) 108 (90 BASE) MCG/ACT inhaler Inhale 2 puffs into the lungs every 6 (six) hours as needed for wheezing or shortness of breath. For shortness of breath and wheezing  1 Inhaler  prn  . albuterol (PROVENTIL) (2.5 MG/3ML) 0.083% nebulizer solution Take 3 mLs (2.5 mg total) by nebulization every 6 (six) hours as needed for wheezing or shortness of breath.  75 mL  12  . atorvastatin (LIPITOR) 80 MG tablet Take 80 mg by mouth daily.      . clopidogrel (PLAVIX) 75 MG tablet Take 1 tablet by mouth daily.      . diazepam (VALIUM) 5 MG tablet Take 0.5 tablets by mouth as needed. .5 to 1 tablet prn for muscle cramps      . diclofenac (VOLTAREN) 75 MG EC tablet Take 75 mg by mouth daily as needed. For muscle aches      . esomeprazole (NEXIUM) 40 MG capsule Take 40 mg by mouth daily before breakfast.        . fluconazole (DIFLUCAN) 150 MG tablet Take 150 mg by mouth as needed.      . furosemide (LASIX) 40 MG tablet Take 40 mg by mouth daily as needed. For fluid retention      . guaiFENesin (MUCINEX) 600 MG 12 hr tablet Take 1,200 mg by mouth 2 (two) times daily as needed.       Marland Kitchen HYDROcodone-homatropine (HYDROMET) 5-1.5 MG/5ML syrup Take  5 mLs by mouth every 6 (six) hours as needed for cough.  240 mL  0  . Insulin Detemir (LEVEMIR FLEXPEN [place]) Inject into the skin.      . INVOKANA 100 MG TABS Take 100 mg by mouth daily before breakfast.       . isosorbide mononitrate (IMDUR) 120 MG 24 hr tablet Take 1 tablet (120 mg total) by mouth daily.  30 tablet  11  . levothyroxine (SYNTHROID, LEVOTHROID) 200 MCG tablet Take 200 mcg by mouth daily.      . metFORMIN (GLUMETZA) 1000 MG (MOD) 24 hr tablet Take 1,000 mg by mouth 2 (two) times daily with a meal.        . metoprolol succinate (TOPROL-XL) 25 MG 24 hr tablet Take 25 mg by mouth daily.        Marland Kitchen NITROSTAT 0.4 MG SL tablet Place 1 tablet under the tongue as needed.       Marland Kitchen NOVOTWIST 32G X 5 MM MISC Use as directed      . omega-3 acid ethyl esters (LOVAZA) 1 G capsule TAKE 2 CAPSULES (2 G TOTAL) BY MOUTH 2 (TWO) TIMES DAILY.  120 capsule  7  . ONE TOUCH ULTRA TEST test strip Use as directed      . triamterene-hydrochlorothiazide (DYAZIDE) 37.5-25 MG per capsule Take 1 capsule by mouth every morning.        . verapamil (COVERA HS) 240 MG (CO) 24 hr tablet Take 240 mg by mouth daily.        No current facility-administered medications for this visit.    History   Social History  . Marital Status: Widowed    Spouse Name: N/A    Number of Children: N/A  . Years of Education: N/A   Occupational History  . retired    Social History Main Topics  . Smoking status: Former Smoker -- 0.10 packs/day for 30 years    Quit date: 08/09/2003  . Smokeless tobacco: Never Used  . Alcohol Use: No  . Drug Use: No  . Sexual Activity: Not on file   Other Topics Concern  . Not on file   Social History Narrative  . No narrative on file   Social she is widowed. She does try to be active. There is no tobacco use. She does drink occasional alcohol.  Family History  Problem Relation Age of Onset  . Hypertension Mother   . Heart disease Mother   . Prostate cancer Father   . Heart disease Brother    ROS General: Negative; No fevers, chills, or night sweats;  HEENT: Negative; No changes in vision or hearing, sinus congestion, difficulty swallowing Pulmonary: Negative; No cough, wheezing, shortness of breath, hemoptysis Cardiovascular: Negative; No chest pain, presyncope, syncope, palpitations Positive for rare, intermittent leg swelling GI: Positive for GERD; No nausea, vomiting, diarrhea, or abdominal pain GU: Negative; No dysuria, hematuria, or difficulty voiding Musculoskeletal: Positive for arthritis in her knees and hips; no myalgias, joint pain, or weakness Hematologic/Oncology: Negative; no easy bruising, bleeding Endocrine: Positive for hypothyroidism,  and type 2 diabetes mellitus; Neuro: Negative; no changes in balance, headaches Skin: Negative; No rashes or skin lesions Psychiatric: Negative; No behavioral problems, depression Sleep: Negative; No snoring, daytime sleepiness, hypersomnolence, bruxism, restless legs, hypnogognic hallucinations, no cataplexy Other comprehensive 14 point system review is negative.   PE BP 140/74  Pulse 61  Ht 5\' 2"  (1.575 m)  Wt 166 lb 6.4 oz (75.479 kg)  BMI 30.43 kg/m2  General: Alert, oriented, no distress.  Skin: normal turgor, no rashes HEENT: Normocephalic, atraumatic. Pupils round and reactive; sclera anicteric;no lid lag.  Nose without nasal septal hypertrophy Mouth/Parynx benign; Mallinpatti scale 2 Neck: No JVD, no carotid bruits with normal carotid upstroke Lungs: clear to ausculatation and percussion; no wheezing or rales Chest wall: Nontender to palpation Heart: RRR, s1 s2 normal 1/6 systolic murmur. Abdomen: soft, nontender; no hepatosplenomehaly, BS+; abdominal aorta nontender and not dilated by palpation. Back: no CVA tenderness Pulses 2+ Extremities: no clubbing cyanosis or edema, Homan's sign negative  Neurologic: grossly nonfocal Psychologic: normal affect and mood.  ECG (independently read by me): Normal sinus rhythm at 61 beats per minute.  QTc interval 4-6 ms.  Nonspecific ST-T changes inferolaterally  December 2014 ECG: Normal sinus rhythm at 62 beats per minute. Nonspecific inferolateral T wave changes.  LABS:  BMET    Component Value Date/Time   NA 137 09/02/2013 1928   K 4.2 09/02/2013 1928   CL 96 09/02/2013 1928   CO2 21 09/02/2013 1928   GLUCOSE 114* 09/02/2013 1928   BUN 13 09/02/2013 1928   CREATININE 1.20* 09/02/2013 1928   CALCIUM 9.2 09/02/2013 1928   GFRNONAA 49* 09/02/2013 1928   GFRAA 57* 09/02/2013 1928     Hepatic Function Panel     Component Value Date/Time   PROT 8.8* 09/02/2013 1928   ALBUMIN 3.9 09/02/2013 1928   AST 17 09/02/2013 1928   ALT 10  09/02/2013 1928   ALKPHOS 79 09/02/2013 1928   BILITOT 0.3 09/02/2013 1928     CBC    Component Value Date/Time   WBC 7.7 09/02/2013 1928   RBC 4.73 09/02/2013 1928   HGB 11.7* 09/02/2013 1928   HCT 36.8 09/02/2013 1928   PLT 275 09/02/2013 1928   MCV 77.8* 09/02/2013 1928   MCH 24.7* 09/02/2013 1928   MCHC 31.8 09/02/2013 1928   RDW 17.0* 09/02/2013 1928   LYMPHSABS 1.1 09/02/2013 1928   MONOABS 0.9 09/02/2013 1928   EOSABS 0.1 09/02/2013 1928   BASOSABS 0.0 09/02/2013 1928     BNP    Component Value Date/Time   PROBNP 55.0 06/01/2008 0152    Lipid Panel  No results found for this basename: chol,  trig,  hdl,  cholhdl,  vldl,  ldlcalc     RADIOLOGY: No results found.    ASSESSMENT AND PLAN: Ms. Alfred is a 58 year old female with established coronary artery disease and is now 8 years status post percutaneous coronary intervention to her proximal right coronary artery. At that time, she did have concomitant CAD involving a 60 - 70% stenosis of her LAD as well as 20-30%.  She has experienced episodes of chest discomfort.  At times these can occur without exertional precipitation and can be associated with a "throbbing" sensation suggesting probable noncardiac etiology.  However, she has noticed a significant change with shortness of breath with activity, particularly with walking up steps.  Her last nuclear perfusion study was in August 2012.  In light of her established CAD, I am recommending she undergo a three-year followup evaluation.  Her ECG shows nonspecific ST-T changes inferolaterally.  She tells me she's not had laboratory done this year.  I will check a complete set of laboratory in a fasting state and adjustments will be made as necessary to her medical regimen.  Her blood pressure today is controlled on her current medical regimen consisting of Toprol-XL 25 mg, verapamil 240 mg she is on isosorbide.  She  continues to take Voltaren for nonsteroidal anti-inflammatory medication  for arthritic symptomatology, but still has experienced.  Her chest pain, despite taking this.  She is on metformin, and invokana for her diabetes mellitus.  She is on Synthroid for her hypothyroidism.  She is tolerating atorvastatin for  lipid management.  She denies recent wheezing on her current dose of Advair.  I will see her in the office in 6 weeks for followup evaluation following the above studies and nuclear evaluation.  Troy Sine, MD, [facility]  05/06/2014 10:07 AM

## 2014-05-21 ENCOUNTER — Encounter (HOSPITAL_COMMUNITY): Payer: 59

## 2014-05-29 ENCOUNTER — Other Ambulatory Visit: Payer: Self-pay

## 2014-05-29 ENCOUNTER — Ambulatory Visit (INDEPENDENT_AMBULATORY_CARE_PROVIDER_SITE_OTHER): Payer: 59 | Admitting: Cardiology

## 2014-05-29 ENCOUNTER — Encounter (HOSPITAL_COMMUNITY)
Admission: AD | Disposition: A | Discharge status: Home / Self Care | Payer: Self-pay | Source: Ambulatory Visit | Attending: Cardiology

## 2014-05-29 ENCOUNTER — Encounter: Payer: Self-pay | Admitting: Cardiology

## 2014-05-29 ENCOUNTER — Ambulatory Visit (HOSPITAL_COMMUNITY)
Admission: AD | Admit: 2014-05-29 | Discharge: 2014-05-30 | Disposition: A | Discharge status: Home / Self Care | Payer: 59 | Source: Ambulatory Visit | Attending: Cardiology | Admitting: Cardiology

## 2014-05-29 ENCOUNTER — Ambulatory Visit (HOSPITAL_BASED_OUTPATIENT_CLINIC_OR_DEPARTMENT_OTHER)
Admission: RE | Admit: 2014-05-29 | Discharge: 2014-05-29 | Disposition: A | Discharge status: Home / Self Care | Payer: 59 | Source: Ambulatory Visit | Attending: Cardiovascular Disease | Admitting: Cardiovascular Disease

## 2014-05-29 ENCOUNTER — Encounter (HOSPITAL_COMMUNITY): Payer: Self-pay | Admitting: General Practice

## 2014-05-29 VITALS — BP 124/68 | HR 68 | Ht 62.0 in | Wt 166.0 lb

## 2014-05-29 DIAGNOSIS — Z7902 Long term (current) use of antithrombotics/antiplatelets: Secondary | ICD-10-CM | POA: Diagnosis not present

## 2014-05-29 DIAGNOSIS — E785 Hyperlipidemia, unspecified: Secondary | ICD-10-CM

## 2014-05-29 DIAGNOSIS — Z79899 Other long term (current) drug therapy: Secondary | ICD-10-CM | POA: Insufficient documentation

## 2014-05-29 DIAGNOSIS — I25110 Atherosclerotic heart disease of native coronary artery with unstable angina pectoris: Secondary | ICD-10-CM | POA: Diagnosis not present

## 2014-05-29 DIAGNOSIS — I10 Essential (primary) hypertension: Secondary | ICD-10-CM

## 2014-05-29 DIAGNOSIS — Z955 Presence of coronary angioplasty implant and graft: Secondary | ICD-10-CM | POA: Diagnosis not present

## 2014-05-29 DIAGNOSIS — D649 Anemia, unspecified: Secondary | ICD-10-CM | POA: Insufficient documentation

## 2014-05-29 DIAGNOSIS — R0789 Other chest pain: Secondary | ICD-10-CM

## 2014-05-29 DIAGNOSIS — E119 Type 2 diabetes mellitus without complications: Secondary | ICD-10-CM | POA: Insufficient documentation

## 2014-05-29 DIAGNOSIS — I200 Unstable angina: Secondary | ICD-10-CM

## 2014-05-29 DIAGNOSIS — Z791 Long term (current) use of non-steroidal anti-inflammatories (NSAID): Secondary | ICD-10-CM | POA: Insufficient documentation

## 2014-05-29 DIAGNOSIS — I2510 Atherosclerotic heart disease of native coronary artery without angina pectoris: Secondary | ICD-10-CM

## 2014-05-29 DIAGNOSIS — R9439 Abnormal result of other cardiovascular function study: Secondary | ICD-10-CM | POA: Diagnosis present

## 2014-05-29 DIAGNOSIS — I25119 Atherosclerotic heart disease of native coronary artery with unspecified angina pectoris: Secondary | ICD-10-CM

## 2014-05-29 HISTORY — DX: Angina pectoris, unspecified: I20.9

## 2014-05-29 HISTORY — DX: Anemia, unspecified: D64.9

## 2014-05-29 HISTORY — PX: LEFT HEART CATHETERIZATION WITH CORONARY ANGIOGRAM: SHX5451

## 2014-05-29 HISTORY — DX: Essential (primary) hypertension: I10

## 2014-05-29 HISTORY — DX: Unspecified osteoarthritis, unspecified site: M19.90

## 2014-05-29 LAB — CBC
HEMATOCRIT: 32.2 % — AB (ref 36.0–46.0)
HEMOGLOBIN: 10.3 g/dL — AB (ref 12.0–15.0)
MCH: 24 pg — ABNORMAL LOW (ref 26.0–34.0)
MCHC: 32 g/dL (ref 30.0–36.0)
MCV: 74.9 fL — ABNORMAL LOW (ref 78.0–100.0)
Platelets: 295 10*3/uL (ref 150–400)
RBC: 4.3 MIL/uL (ref 3.87–5.11)
RDW: 16.8 % — AB (ref 11.5–15.5)
WBC: 8.4 10*3/uL (ref 4.0–10.5)

## 2014-05-29 LAB — PROTIME-INR
INR: 1.04 (ref 0.00–1.49)
Prothrombin Time: 13.7 seconds (ref 11.6–15.2)

## 2014-05-29 LAB — GLUCOSE, CAPILLARY
GLUCOSE-CAPILLARY: 184 mg/dL — AB (ref 70–99)
Glucose-Capillary: 86 mg/dL (ref 70–99)
Glucose-Capillary: 72 mg/dL (ref 70–99)

## 2014-05-29 LAB — BASIC METABOLIC PANEL
Anion gap: 11 (ref 5–15)
BUN: 6 mg/dL (ref 6–23)
CHLORIDE: 102 meq/L (ref 96–112)
CO2: 27 meq/L (ref 19–32)
CREATININE: 0.85 mg/dL (ref 0.50–1.10)
Calcium: 9.2 mg/dL (ref 8.4–10.5)
GFR calc Af Amer: 86 mL/min — ABNORMAL LOW (ref >90)
GFR calc non Af Amer: 74 mL/min — ABNORMAL LOW (ref >90)
GLUCOSE: 66 mg/dL — AB (ref 70–99)
Potassium: 3.7 mEq/L (ref 3.7–5.3)
Sodium: 140 mEq/L (ref 137–147)

## 2014-05-29 LAB — HEMOGLOBIN A1C
Hgb A1c MFr Bld: 6.6 % — ABNORMAL HIGH (ref <5.7)
Mean Plasma Glucose: 143 mg/dL — ABNORMAL HIGH (ref <117)

## 2014-05-29 LAB — POCT ACTIVATED CLOTTING TIME: Activated Clotting Time: 247 seconds

## 2014-05-29 SURGERY — LEFT HEART CATHETERIZATION WITH CORONARY ANGIOGRAM
Anesthesia: LOCAL

## 2014-05-29 MED ORDER — ALBUTEROL SULFATE HFA 108 (90 BASE) MCG/ACT IN AERS: 2.0000 | INHALATION_SPRAY | Freq: Four times a day (QID) | RESPIRATORY_TRACT | Status: DC | PRN

## 2014-05-29 MED ORDER — ACETAMINOPHEN 325 MG PO TABS: 650.0000 mg | ORAL_TABLET | ORAL | Status: DC | PRN

## 2014-05-29 MED ORDER — SODIUM CHLORIDE 0.9 % IV SOLN: 250.0000 mL | INTRAVENOUS | Status: DC | PRN

## 2014-05-29 MED ORDER — PANTOPRAZOLE SODIUM 40 MG PO TBEC: 40.0000 mg | DELAYED_RELEASE_TABLET | Freq: Every day | ORAL | Status: DC

## 2014-05-29 MED ORDER — SODIUM CHLORIDE 0.9 % IV SOLN
1.0000 mL/kg/h | INTRAVENOUS | Status: DC
  Administered 2014-05-29: 1 mL/kg/h via INTRAVENOUS

## 2014-05-29 MED ORDER — HEPARIN BOLUS VIA INFUSION
4000.0000 [IU] | Freq: Once | INTRAVENOUS | Status: AC
  Administered 2014-05-29: 4000 [IU] via INTRAVENOUS
  Filled 2014-05-29: qty 4000

## 2014-05-29 MED ORDER — FENTANYL CITRATE 0.05 MG/ML IJ SOLN
INTRAMUSCULAR | Status: AC
  Filled 2014-05-29: qty 2

## 2014-05-29 MED ORDER — ALPRAZOLAM 0.25 MG PO TABS: 0.2500 mg | ORAL_TABLET | Freq: Two times a day (BID) | ORAL | Status: DC | PRN

## 2014-05-29 MED ORDER — SODIUM CHLORIDE 0.9 % IJ SOLN: 3.0000 mL | INTRAMUSCULAR | Status: DC | PRN

## 2014-05-29 MED ORDER — TECHNETIUM TC 99M SESTAMIBI GENERIC - CARDIOLITE
30.0000 | Freq: Once | INTRAVENOUS | Status: AC | PRN
Start: 2014-05-29 — End: 2014-05-29
  Administered 2014-05-29: 30 via INTRAVENOUS

## 2014-05-29 MED ORDER — CANAGLIFLOZIN 100 MG PO TABS
100.0000 mg | ORAL_TABLET | Freq: Every day | ORAL | Status: DC
  Filled 2014-05-29 (×2): qty 1

## 2014-05-29 MED ORDER — ISOSORBIDE MONONITRATE ER 60 MG PO TB24
120.0000 mg | ORAL_TABLET | Freq: Every day | ORAL | Status: DC
  Filled 2014-05-29 (×2): qty 2

## 2014-05-29 MED ORDER — NITROGLYCERIN 0.4 MG SL SUBL: 0.4000 mg | SUBLINGUAL_TABLET | SUBLINGUAL | Status: DC | PRN

## 2014-05-29 MED ORDER — ATORVASTATIN CALCIUM 80 MG PO TABS
80.0000 mg | ORAL_TABLET | Freq: Every day | ORAL | Status: DC
  Filled 2014-05-29 (×2): qty 1

## 2014-05-29 MED ORDER — ASPIRIN 81 MG PO CHEW
81.0000 mg | CHEWABLE_TABLET | Freq: Every day | ORAL | Status: DC
  Administered 2014-05-29: 81 mg via ORAL
  Filled 2014-05-29: qty 1

## 2014-05-29 MED ORDER — SODIUM CHLORIDE 0.9 % IJ SOLN: 3.0000 mL | Freq: Two times a day (BID) | INTRAMUSCULAR | Status: DC

## 2014-05-29 MED ORDER — ONDANSETRON HCL 4 MG/2ML IJ SOLN: 4.0000 mg | Freq: Four times a day (QID) | INTRAMUSCULAR | Status: DC | PRN

## 2014-05-29 MED ORDER — ASPIRIN 81 MG PO CHEW
324.0000 mg | CHEWABLE_TABLET | ORAL | Status: AC
  Administered 2014-05-29: 324 mg via ORAL
  Filled 2014-05-29: qty 4

## 2014-05-29 MED ORDER — TECHNETIUM TC 99M SESTAMIBI GENERIC - CARDIOLITE
10.0000 | Freq: Once | INTRAVENOUS | Status: AC | PRN
  Administered 2014-05-29: 10 via INTRAVENOUS

## 2014-05-29 MED ORDER — SODIUM CHLORIDE 0.9 % IJ SOLN
3.0000 mL | Freq: Two times a day (BID) | INTRAMUSCULAR | Status: DC
  Administered 2014-05-29: 3 mL via INTRAVENOUS

## 2014-05-29 MED ORDER — OMEGA-3-ACID ETHYL ESTERS 1 G PO CAPS
2.0000 g | ORAL_CAPSULE | Freq: Two times a day (BID) | ORAL | Status: DC
  Filled 2014-05-29 (×3): qty 2

## 2014-05-29 MED ORDER — SODIUM CHLORIDE 0.9 % IV SOLN: INTRAVENOUS | Status: AC

## 2014-05-29 MED ORDER — NITROGLYCERIN 1 MG/10 ML FOR IR/CATH LAB
INTRA_ARTERIAL | Status: AC
  Filled 2014-05-29: qty 10

## 2014-05-29 MED ORDER — LEVOTHYROXINE SODIUM 200 MCG PO TABS
200.0000 ug | ORAL_TABLET | Freq: Every day | ORAL | Status: DC
  Filled 2014-05-29 (×2): qty 1

## 2014-05-29 MED ORDER — MOMETASONE FURO-FORMOTEROL FUM 100-5 MCG/ACT IN AERO
2.0000 | INHALATION_SPRAY | Freq: Two times a day (BID) | RESPIRATORY_TRACT | Status: DC
  Filled 2014-05-29: qty 8.8

## 2014-05-29 MED ORDER — HEPARIN (PORCINE) IN NACL 2-0.9 UNIT/ML-% IJ SOLN
INTRAMUSCULAR | Status: AC
  Filled 2014-05-29: qty 1500

## 2014-05-29 MED ORDER — HEPARIN (PORCINE) IN NACL 100-0.45 UNIT/ML-% IJ SOLN
700.0000 [IU]/h | INTRAMUSCULAR | Status: DC
  Administered 2014-05-29: 700 [IU]/h via INTRAVENOUS
  Filled 2014-05-29: qty 250

## 2014-05-29 MED ORDER — ASPIRIN 300 MG RE SUPP
300.0000 mg | RECTAL | Status: AC
  Filled 2014-05-29: qty 1

## 2014-05-29 MED ORDER — VERAPAMIL HCL 240 MG (CO) PO TB24: 240.0000 mg | ORAL_TABLET | Freq: Every day | ORAL | Status: DC

## 2014-05-29 MED ORDER — CLOPIDOGREL BISULFATE 75 MG PO TABS
75.0000 mg | ORAL_TABLET | Freq: Every day | ORAL | Status: DC
  Filled 2014-05-29 (×2): qty 1

## 2014-05-29 MED ORDER — LIDOCAINE HCL (PF) 1 % IJ SOLN
INTRAMUSCULAR | Status: AC
  Filled 2014-05-29: qty 30

## 2014-05-29 MED ORDER — TRIAMTERENE-HCTZ 37.5-25 MG PO CAPS
1.0000 | ORAL_CAPSULE | Freq: Every morning | ORAL | Status: DC
  Filled 2014-05-29: qty 1

## 2014-05-29 MED ORDER — HEPARIN SODIUM (PORCINE) 1000 UNIT/ML IJ SOLN
INTRAMUSCULAR | Status: AC
  Filled 2014-05-29: qty 1

## 2014-05-29 MED ORDER — ALBUTEROL SULFATE (2.5 MG/3ML) 0.083% IN NEBU: 2.5000 mg | INHALATION_SOLUTION | Freq: Four times a day (QID) | RESPIRATORY_TRACT | Status: DC | PRN

## 2014-05-29 MED ORDER — ADENOSINE 12 MG/4ML IV SOLN
12.0000 mL | Freq: Once | INTRAVENOUS | Status: AC
  Administered 2014-05-29: 36 mg via INTRAVENOUS
  Filled 2014-05-29: qty 12

## 2014-05-29 MED ORDER — INSULIN ASPART 100 UNIT/ML ~~LOC~~ SOLN: 0.0000 [IU] | Freq: Three times a day (TID) | SUBCUTANEOUS | Status: DC

## 2014-05-29 MED ORDER — MIDAZOLAM HCL 2 MG/2ML IJ SOLN
INTRAMUSCULAR | Status: AC
  Filled 2014-05-29: qty 2

## 2014-05-29 MED ORDER — METOPROLOL SUCCINATE ER 25 MG PO TB24
25.0000 mg | ORAL_TABLET | Freq: Every day | ORAL | Status: DC
  Filled 2014-05-29 (×2): qty 1

## 2014-05-29 MED ORDER — HYDROCODONE-HOMATROPINE 5-1.5 MG/5ML PO SYRP: 5.0000 mL | ORAL_SOLUTION | Freq: Four times a day (QID) | ORAL | Status: DC | PRN

## 2014-05-29 MED ORDER — DIAZEPAM 5 MG PO TABS: 2.5000 mg | ORAL_TABLET | Freq: Four times a day (QID) | ORAL | Status: DC | PRN

## 2014-05-29 MED ORDER — VERAPAMIL HCL 2.5 MG/ML IV SOLN
INTRAVENOUS | Status: AC
  Filled 2014-05-29: qty 2

## 2014-05-29 MED ORDER — ZOLPIDEM TARTRATE 5 MG PO TABS: 5.0000 mg | ORAL_TABLET | Freq: Every evening | ORAL | Status: DC | PRN

## 2014-05-29 NOTE — Assessment & Plan Note (Signed)
Blood pressure controlled. Continue present medications. 

## 2014-05-29 NOTE — Assessment & Plan Note (Signed)
Patient presents with chest pain concerning for angina. A nuclear study today is markedly abnormal with EKG changes and ischemia in the distal anterior wall, apex and distal inferior wall. There is also possible transient ischemic dilatation noted. She had chest pain at rest 2 days ago. She had mild pain while exercising today. I will admit to telemetry. Proceed with cardiac catheterization. The risks and benefits were discussed and she agrees to proceed. Hold Glucophage for 48 hours following procedure. Hold diuretics prior to procedure.

## 2014-05-29 NOTE — Care Management Note (Signed)
    Page 1 of 1   05/29/2014     2:46:29 PM CARE MANAGEMENT NOTE 05/29/2014  Patient:  [name], [name]   Account Number:  [id number]  Date Initiated:  05/29/2014  Documentation initiated by:  [facility]  Subjective/Objective Assessment:   Patient presents with chest pain concerning for angina. A nuclear study today is markedly abnormal with EKG changes and ischemia.//Home with sister.     Action/Plan:   I will admit to telemetry. Proceed with cardiac catheterization. The risks and benefits were discussed and she agrees to proceed.//Access for disposition needs.   Anticipated DC Date:  06/02/2014   Anticipated DC Plan:  HOME/SELF CARE         Choice offered to / List presented to:             Status of service:  In process, will continue to follow Medicare Important Message given?   (If response is "NO", the following Medicare IM given date fields will be blank) Date Medicare IM given:   Medicare IM given by:   Date Additional Medicare IM given:   Additional Medicare IM given by:    Discharge Disposition:    Per UR Regulation:  Reviewed for med. necessity/level of care/duration of stay  If discussed at [place] of Stay Meetings, dates discussed:    Comments:

## 2014-05-29 NOTE — H&P (Signed)
[name]  05/29/2014 10:30 AM   Office Visit  MRN:  [id number]   Description: Female DOB: 1955-12-25  Provider: Lelon Perla, MD  Department: [facility]          Vital Signs Most recent update: 05/29/2014 10:40 AM by Dionne Bucy Truitt, CMA      BP Pulse Ht Wt BMI      124/68 68 5\' 2"  (1.575 m) 166 lb (75.297 kg) 30.35 kg/m2             Progress Notes      Lelon Perla, MD at 05/29/2014 10:53 AM      Status: Signed                     HPI: FU chest pain. Patient is followed by Dr. Claiborne [name]. Seen recently with chest pain. She had a nuclear study in the office today. Preliminary results reveal severe ischemia in the mid and distal anterior wall and apex. There is also possible transient ischemic dilatation. There were also electric cardiographic changes. She was added to my schedule for further evaluation. She has known coronary artery disease and in 2007 underwent insertion of a 3.0x13 mm Cypher DES stent to her proximal RCA. She did have concomitant CAD with 60-70% stenosis of her LAD and 20% circumflex stenosis. In July 2010 she develop recurrent chest pain and repeat catheterization showed 60% LAD stenosis, 20-30% circumflex stenosis and a widely patent RCA stent.   She has a history of hypertension, hyperlipidemia, hypothyroidism, diabetes mellitus, asthma, GERD, as well as intermittent leg swelling. Patient states she has had chest pain intermittently since the summer. It is in the left chest area. It occurs both with exertion and at rest. She had some chest pain on the treadmill today. Her last chest pain at rest was 2 days ago. She has mild dyspnea on exertion but no orthopnea, PND or pedal edema. No syncope.      Current Outpatient Prescriptions   Medication  Sig  Dispense  Refill   .  ADVAIR DISKUS 250-50 MCG/DOSE AEPB  INHALE ONE (1) PUFF(S) TWICE DAILY RINSE MOUTH   60 each   4   .  albuterol (PROVENTIL HFA) 108 (90 BASE) MCG/ACT inhaler  Inhale 2 puffs  into the lungs every 6 (six) hours as needed for wheezing or shortness of breath. For shortness of breath and wheezing   1 Inhaler   prn   .  albuterol (PROVENTIL) (2.5 MG/3ML) 0.083% nebulizer solution  Take 3 mLs (2.5 mg total) by nebulization every 6 (six) hours as needed for wheezing or shortness of breath.   75 mL   12   .  atorvastatin (LIPITOR) 80 MG tablet  Take 80 mg by mouth daily.         .  clopidogrel (PLAVIX) 75 MG tablet  Take 1 tablet by mouth daily.         .  diazepam (VALIUM) 5 MG tablet  Take 0.5 tablets by mouth as needed. .5 to 1 tablet prn for muscle cramps         .  diclofenac (VOLTAREN) 75 MG EC tablet  Take 75 mg by mouth daily as needed. For muscle aches         .  esomeprazole (NEXIUM) 40 MG capsule  Take 40 mg by mouth daily before breakfast.           .  fluconazole (DIFLUCAN) 150 MG tablet  Take 150 mg by mouth as needed.         .  furosemide (LASIX) 40 MG tablet  Take 40 mg by mouth daily as needed. For fluid retention         .  guaiFENesin (MUCINEX) 600 MG 12 hr tablet  Take 1,200 mg by mouth 2 (two) times daily as needed.          Marland Kitchen  HYDROcodone-homatropine (HYDROMET) 5-1.5 MG/5ML syrup  Take 5 mLs by mouth every 6 (six) hours as needed for cough.   240 mL   0   .  Insulin Detemir (LEVEMIR FLEXPEN [place])  Inject 15 Units into the skin at bedtime.          .  INVOKANA 100 MG TABS  Take 100 mg by mouth daily before breakfast.          .  isosorbide mononitrate (IMDUR) 120 MG 24 hr tablet  Take 1 tablet (120 mg total) by mouth daily.   30 tablet   11   .  levothyroxine (SYNTHROID, LEVOTHROID) 200 MCG tablet  Take 200 mcg by mouth daily.         .  metFORMIN (GLUMETZA) 1000 MG (MOD) 24 hr tablet  Take 1,000 mg by mouth 2 (two) times daily with a meal.           .  metoprolol succinate (TOPROL-XL) 25 MG 24 hr tablet  Take 25 mg by mouth daily.           Marland Kitchen  NITROSTAT 0.4 MG SL tablet  Place 1 tablet under the tongue as needed.         Marland Kitchen  NOVOTWIST 32G X 5 MM MISC  Use as  directed         .  omega-3 acid ethyl esters (LOVAZA) 1 G capsule  TAKE 2 CAPSULES (2 G TOTAL) BY MOUTH 2 (TWO) TIMES DAILY.   120 capsule   7   .  ONE TOUCH ULTRA TEST test strip  Use as directed         .  triamterene-hydrochlorothiazide (DYAZIDE) 37.5-25 MG per capsule  Take 1 capsule by mouth every morning.           .  verapamil (COVERA HS) 240 MG (CO) 24 hr tablet  Take 240 mg by mouth daily.              No current facility-administered medications for this visit.           Past Medical History   Diagnosis  Date   .  Unspecified hypothyroidism     .  Cough     .  Acute bronchitis     .  Obstructive sleep apnea (adult) (pediatric)     .  Coronary atherosclerosis     .  Type II or unspecified type diabetes mellitus without mention of complication, not stated as uncontrolled           Past Surgical History   Procedure  Laterality  Date   .  Tubal ligation       .  Anterior fusion cervical spine             History       Social History   .  Marital Status:  Widowed       Spouse Name:  N/A       Number of Children:  N/A   .  Years of Education:  N/A  Occupational History   .  retired         Social History Main Topics   .  Smoking status:  Former Smoker -- 0.10 packs/day for 30 years       Quit date:  08/09/2003   .  Smokeless tobacco:  Never Used   .  Alcohol Use:  No   .  Drug Use:  No   .  Sexual Activity:  Not on file       Other Topics  Concern   .  Not on file       Social History Narrative   .  No narrative on file        ROS: no fevers or chills, productive cough, hemoptysis, dysphasia, odynophagia, melena, hematochezia, dysuria, hematuria, rash, seizure activity, orthopnea, PND, pedal edema, claudication. Remaining systems are negative.   Physical Exam: Well-developed well-nourished in no acute distress.   Skin is warm and dry.   HEENT is normal.   Neck is supple.   Chest is clear to auscultation with normal expansion.    Cardiovascular exam is regular rate and rhythm.   Abdominal exam nontender or distended. No masses palpated. Extremities show no edema. neuro grossly intact     Electrocardiogram shows sinus rhythm with nonspecific ST changes.              Coronary atherosclerosis - Lelon Perla, MD at 05/29/2014 11:12 AM      Status: Written Related Problem: Coronary atherosclerosis    Continue aspirin, Plavix and statin.         HTN (hypertension) - Lelon Perla, MD at 05/29/2014 11:12 AM      Status: Written Related Problem: HTN (hypertension)    Blood pressure controlled. Continue present medications.         Hyperlipidemia with target LDL less than 70 - Lelon Perla, MD at 05/29/2014 11:12 AM      Status: Written Related Problem: Hyperlipidemia with target LDL less than 70    Continue statin.         Unstable angina - Lelon Perla, MD at 05/29/2014 11:13 AM      Status: Written Related Problem: Unstable angina    Patient presents with chest pain concerning for angina. A nuclear study today is markedly abnormal with EKG changes and ischemia in the distal anterior wall, apex and distal inferior wall. There is also possible transient ischemic dilatation noted. She had chest pain at rest 2 days ago. She had mild pain while exercising today. I will admit to telemetry. Proceed with cardiac catheterization. The risks and benefits were discussed and she agrees to proceed. Hold Glucophage for 48 hours following procedure. Hold diuretics prior to procedure.

## 2014-05-29 NOTE — CV Procedure (Signed)
Left Heart Catheterization with Coronary Angiography and FFR Report  [name]  58 y.o.  female [birth year]  Procedure Date: 05/29/2014 Referring Physician: Kirk Ruths, M.D. Primary Cardiologist: Shelva Majestic, M.D.  INDICATIONS: High risk myocardial perfusion study with anterior and apical ischemia  PROCEDURE: 1. Left heart catheterization; 2. Coronary angiography; 3. Left ventriculography; 4. FFR LAD  CONSENT:  The risks, benefits, and details of the procedure were explained in detail to the patient. Risks including death, stroke, heart attack, kidney injury, allergy, limb ischemia, bleeding and radiation injury were discussed.  The patient verbalized understanding and wanted to proceed.  Informed written consent was obtained.  PROCEDURE TECHNIQUE:  After Xylocaine anesthesia a 5 French Slender sheath was placed in the right radial artery with an angiocath and the modified Seldinger technique.  Coronary angiography was done using a 5 F JR 4, JL 3.5 cm, and 3.0 cm EBU (Guide) catheter.  Left ventriculography was done using the JR 4 catheter and hand injection.   Review of the images demonstrated anatomy very similar to that noted at the time of last catheterization performed by Dr. Claiborne Billings in 2010. The right coronary stent is widely patent. There is a segmental eccentric 50-60% proximal to mid LAD stenosis. Circumflex is widely patent with minimal irregularities noted in the distal vessel. Because of the abnormalities noted on the nuclear perfusion study, FFR was performed of the LAD lesion. This was done after adequate anticoagulation he get an ACT greater than 250. FFR was noted to be 0.94. The case was then terminated. Hemostasis was achieved with a Vasc Band. No complications occurred. The patient did develop significant dyspnea with adenosine infusion during the FFR protocol.   CONTRAST:  Total of 100 cc.  COMPLICATIONS:  Dyspnea during adenosine infusion    HEMODYNAMICS:  Aortic pressure 159/56 mmHg; LV pressure 159/9 mmHg; LVEDP 18 mm mercury  ANGIOGRAPHIC DATA:   The left main coronary artery is widely patent.  The left anterior descending artery is moderately calcified in the proximal and mid segment there is an eccentric shelflike region of narrowing extending from the first of perforator to the mid vessel. This is similar to prior angiographic appearance with 50% narrowing. Scattered irregularities are noted further distally and near the apex. The first diagonal contains eccentric 70% narrowing. The territory supplied a small..  The left circumflex artery is a large vessel. It gives origin to 3 obtuse marginal branches. Eccentric narrowing is noted in the circumflex beyond the first obtuse marginal that obstructs the vessel by up to 30%. Further distally beyond the origin of the second obtuse marginal there is 40% narrowing. The proximal portion of the second obtuse marginal contains 30-40% narrowing..  The right coronary artery is widely patent including the stent that is present in the proximal segment. Small PDA arises.  PCI RESULTS: As described above, PCI was not performed. FFR of the proximal to mid segmental LAD stenosis was 0.94.  LEFT VENTRICULOGRAM:  Left ventricular angiogram was done in the 30 RAO projection and revealed a normal cavity size with estimated ejection fraction of 60%.   IMPRESSIONS:  1. Widely patent right coronary stent 2. Segmental 50% proximal to mid LAD stenosis with FFR of 0.94. The angiographic appearance is unchanged from 2010. 3. False positive myocardial perfusion study 4. Normal left ventricular function 5. Irregularities noted in the circumflex    RECOMMENDATION:  Continued medical therapy, which include aggressive risk factor modification. There is no indication for intervention on the  LAD which supplies a territory that caused the appearance of ischemia on nuclear scintigraphy. It would appear this  time that the study was false positive. Other explanations might include vasovagal reactive blood flow disturbance during the procedure with coronary spasm being induced. In the cath lab adenosine perfusion did not demonstrate evidence of hemodynamic significance in the proximal to mid LAD stenosis.  Plan discharge in a.m.Marland Kitchen

## 2014-05-29 NOTE — Progress Notes (Signed)
HPI: FU chest pain. Patient is followed by Dr. Claiborne Billings. Seen recently with chest pain. She had a nuclear study in the office today. Preliminary results reveal severe ischemia in the mid and distal anterior wall and apex. There is also possible transient ischemic dilatation. There were also electric cardiographic changes. She was added to my schedule for further evaluation. She has known coronary artery disease and in 2007 underwent insertion of a 3.0x13 mm Cypher DES stent to her proximal RCA. She did have concomitant CAD with 60-70% stenosis of her LAD and 20% circumflex stenosis. In July 2010 she develop recurrent chest pain and repeat catheterization showed 60% LAD stenosis, 20-30% circumflex stenosis and a widely patent RCA stent.  She has a history of hypertension, hyperlipidemia, hypothyroidism, diabetes mellitus, asthma, GERD, as well as intermittent leg swelling. Patient states she has had chest pain intermittently since the summer. It is in the left chest area. It occurs both with exertion and at rest. She had some chest pain on the treadmill today. Her last chest pain at rest was 2 days ago. She has mild dyspnea on exertion but no orthopnea, PND or pedal edema. No syncope.   Current Outpatient Prescriptions  Medication Sig Dispense Refill  . ADVAIR DISKUS 250-50 MCG/DOSE AEPB INHALE ONE (1) PUFF(S) TWICE DAILY RINSE MOUTH  60 each  4  . albuterol (PROVENTIL HFA) 108 (90 BASE) MCG/ACT inhaler Inhale 2 puffs into the lungs every 6 (six) hours as needed for wheezing or shortness of breath. For shortness of breath and wheezing  1 Inhaler  prn  . albuterol (PROVENTIL) (2.5 MG/3ML) 0.083% nebulizer solution Take 3 mLs (2.5 mg total) by nebulization every 6 (six) hours as needed for wheezing or shortness of breath.  75 mL  12  . atorvastatin (LIPITOR) 80 MG tablet Take 80 mg by mouth daily.      . clopidogrel (PLAVIX) 75 MG tablet Take 1 tablet by mouth daily.      . diazepam (VALIUM) 5 MG  tablet Take 0.5 tablets by mouth as needed. .5 to 1 tablet prn for muscle cramps      . diclofenac (VOLTAREN) 75 MG EC tablet Take 75 mg by mouth daily as needed. For muscle aches      . esomeprazole (NEXIUM) 40 MG capsule Take 40 mg by mouth daily before breakfast.        . fluconazole (DIFLUCAN) 150 MG tablet Take 150 mg by mouth as needed.      . furosemide (LASIX) 40 MG tablet Take 40 mg by mouth daily as needed. For fluid retention      . guaiFENesin (MUCINEX) 600 MG 12 hr tablet Take 1,200 mg by mouth 2 (two) times daily as needed.       Marland Kitchen HYDROcodone-homatropine (HYDROMET) 5-1.5 MG/5ML syrup Take 5 mLs by mouth every 6 (six) hours as needed for cough.  240 mL  0  . Insulin Detemir (LEVEMIR FLEXPEN [place]) Inject 15 Units into the skin at bedtime.       . INVOKANA 100 MG TABS Take 100 mg by mouth daily before breakfast.       . isosorbide mononitrate (IMDUR) 120 MG 24 hr tablet Take 1 tablet (120 mg total) by mouth daily.  30 tablet  11  . levothyroxine (SYNTHROID, LEVOTHROID) 200 MCG tablet Take 200 mcg by mouth daily.      . metFORMIN (GLUMETZA) 1000 MG (MOD) 24 hr tablet Take 1,000 mg by mouth 2 (two)  times daily with a meal.        . metoprolol succinate (TOPROL-XL) 25 MG 24 hr tablet Take 25 mg by mouth daily.        Marland Kitchen NITROSTAT 0.4 MG SL tablet Place 1 tablet under the tongue as needed.      Marland Kitchen NOVOTWIST 32G X 5 MM MISC Use as directed      . omega-3 acid ethyl esters (LOVAZA) 1 G capsule TAKE 2 CAPSULES (2 G TOTAL) BY MOUTH 2 (TWO) TIMES DAILY.  120 capsule  7  . ONE TOUCH ULTRA TEST test strip Use as directed      . triamterene-hydrochlorothiazide (DYAZIDE) 37.5-25 MG per capsule Take 1 capsule by mouth every morning.        . verapamil (COVERA HS) 240 MG (CO) 24 hr tablet Take 240 mg by mouth daily.        No current facility-administered medications for this visit.     Past Medical History  Diagnosis Date  . Unspecified hypothyroidism   . Cough   . Acute bronchitis   .  Obstructive sleep apnea (adult) (pediatric)   . Coronary atherosclerosis   . Type II or unspecified type diabetes mellitus without mention of complication, not stated as uncontrolled     Past Surgical History  Procedure Laterality Date  . Tubal ligation    . Anterior fusion cervical spine      History   Social History  . Marital Status: Widowed    Spouse Name: N/A    Number of Children: N/A  . Years of Education: N/A   Occupational History  . retired    Social History Main Topics  . Smoking status: Former Smoker -- 0.10 packs/day for 30 years    Quit date: 08/09/2003  . Smokeless tobacco: Never Used  . Alcohol Use: No  . Drug Use: No  . Sexual Activity: Not on file   Other Topics Concern  . Not on file   Social History Narrative  . No narrative on file    ROS: no fevers or chills, productive cough, hemoptysis, dysphasia, odynophagia, melena, hematochezia, dysuria, hematuria, rash, seizure activity, orthopnea, PND, pedal edema, claudication. Remaining systems are negative.  Physical Exam: Well-developed well-nourished in no acute distress.  Skin is warm and dry.  HEENT is normal.  Neck is supple.  Chest is clear to auscultation with normal expansion.  Cardiovascular exam is regular rate and rhythm.  Abdominal exam nontender or distended. No masses palpated. Extremities show no edema. neuro grossly intact   Electrocardiogram shows sinus rhythm with nonspecific ST changes.

## 2014-05-29 NOTE — Assessment & Plan Note (Signed)
Continue aspirin, Plavix and statin. 

## 2014-05-29 NOTE — Interval H&P Note (Signed)
Cath Lab Visit (complete for each Cath Lab visit)  Clinical Evaluation Leading to the Procedure:   ACS: No.  Non-ACS:    Anginal Classification: CCS III  Anti-ischemic medical therapy: Maximal Therapy (2 or more classes of medications)  Non-Invasive Test Results: High-risk stress test findings: cardiac mortality >3%/year  Prior CABG: No previous CABG      History and Physical Interval Note:  05/29/2014 4:36 PM  [name]  has presented today for surgery, with the diagnosis of cp  The various methods of treatment have been discussed with the patient and family. After consideration of risks, benefits and other options for treatment, the patient has consented to  Procedure(s): LEFT HEART CATHETERIZATION WITH CORONARY ANGIOGRAM (N/A) as a surgical intervention .  The patient's history has been reviewed, patient examined, no change in status, stable for surgery.  I have reviewed the patient's chart and labs.  Questions were answered to the patient's satisfaction.     Sinclair Grooms

## 2014-05-29 NOTE — Procedures (Addendum)
[facility] [address] [place] [address] [id number]  Cardiology Nuclear Med Study  [name] is a 58 y.o. female     MRN : [id number]     DOB: [birth year]  Procedure Date: 05/29/2014  Nuclear Med Background Indication for Stress Test:  Evaluation for Ischemia, Stent Patency and Abnormal EKG History:  CAD-2007 Stent, PTCA Cardiac Risk Factors: Family History - CAD, History of Smoking, Hypertension and Lipids  Symptoms:  Chest Pain and DOE   Nuclear Pre-Procedure Caffeine/Decaff Intake:  None NPO After: 6 pm   IV Site: R Antecubital  IV 0.9% NS with Angio Cath:  22g  Chest Size (in):  42  IV Started by: Crissie Figures, RN  Height: 5\' 2"  (1.575 m)  Cup Size: C  BMI:  Body mass index is 30.35 kg/(m^2). Weight:  166 lb (75.297 kg)   Tech Comments:  N/A    Nuclear Med Study 1 or 2 day study: 1 day  Stress Test Type:  Stress  Order Authorizing Provider:  Shelva Majestic, MD   Resting Radionuclide: Technetium 11m Sestamibi  Resting Radionuclide Dose: 10.5 mCi   Stress Radionuclide:  Technetium 81m Sestamibi  Stress Radionuclide Dose: 32.0 mCi           Stress Protocol Rest HR: 63 Stress HR: 141  Rest BP: 173/77 Stress BP: 169/78  Exercise Time (min): 6 METS: 7.10   Predicted Max HR: 162 bpm % Max HR: 87.04 bpm Rate Pressure Product: 27354  Dose of Adenosine (mg):  n/a Dose of Lexiscan: n/a mg  Dose of Atropine (mg): n/a Dose of Dobutamine: n/a mcg/kg/min (at max HR)  Stress Test Technologist: Leane Para, CCT Nuclear Technologist: Otho Perl, CNMT   Rest Procedure:  Myocardial perfusion imaging was performed at rest 45 minutes following the intravenous administration of Technetium 46m Sestamibi. Stress Procedure:  The patient performed treadmill exercise using a Bruce  Protocol for 6 minutes. The patient stopped due to Chest Pain, SOB and ST-T Changes and denied any chest pain.  There were no  significant ST-T wave changes.  Technetium 33m Sestamibi was injected IV at peak exercise and myocardial perfusion imaging was performed after a brief delay.  Transient Ischemic Dilatation (Normal <1.22):  0.99 QGS EDV:  79 ml QGS ESV:  26 ml LV Ejection Fraction: 67%        Rest ECG: NSR with non-specific ST-T wave changes  Stress ECG: Significant ST abnormalities consistent with ischemia.  QPS Raw Data Images:  Acquisition technically good; normal left ventricular size. Stress Images:  There is decreased uptake in the distal anterior wall, apex and inferior wall. Rest Images:  Normal homogeneous uptake in all areas of the myocardium. Subtraction (SDS):  These findings are consistent with ischemia.  Impression Exercise Capacity:  Fair exercise capacity. BP Response:  Normal blood pressure response. Clinical Symptoms:  There is chest pain. ECG Impression:  Significant ST abnormalities consistent with ischemia. Comparison with Prior Nuclear Study: No images to compare  Overall Impression:  High risk stress nuclear study with a large, moderate intensity, reversible distal anterior, apical and inferior defect consistent with severe ischemia; also with possible transient ischemic dilatation of LV cavity; note CP and ECG changes as well. Patient admitted for cardiac catheterization.  LV Wall Motion:  NL LV Function; NL Wall Motion   Kirk Ruths, MD  05/29/2014 1:18 PM

## 2014-05-29 NOTE — Assessment & Plan Note (Signed)
Continue statin. 

## 2014-05-29 NOTE — Progress Notes (Signed)
ANTICOAGULATION CONSULT NOTE - Initial Consult  Pharmacy Consult for Heparin Indication: chest pain/ACS  Allergies  Allergen Reactions  . Codeine     REACTION: hallucinations/"loopy"  . Sulfonamide Derivatives Hives  . Sulfa Antibiotics Hives    Patient Measurements: Height: 5\' 2"  (157.5 cm) Weight: 165 lb (74.844 kg) IBW/kg (Calculated) : 50.1 Heparin Dosing Weight:   66 kg  Vital Signs: Temp: 98.2 F (36.8 C) (10/22 1308) Temp Source: Oral (10/22 1308) BP: 151/70 mmHg (10/22 1308) Pulse Rate: 68 (10/22 1308)  Labs: No results found for this basename: HGB, HCT, PLT, APTT, LABPROT, INR, HEPARINUNFRC, CREATININE, CKTOTAL, CKMB, TROPONINI,  in the last 72 hours  Estimated Creatinine Clearance: 48.4 ml/min (by C-G formula based on Cr of 1.2).   Medical History: Past Medical History  Diagnosis Date  . Unspecified hypothyroidism   . Cough   . Acute bronchitis   . Obstructive sleep apnea (adult) (pediatric)   . Coronary atherosclerosis   . Anginal pain   . Hypertension   . Type II or unspecified type diabetes mellitus without mention of complication, not stated as uncontrolled     type 2  . Arthritis     osteo  . Anemia     hx of anemia    Medications:  Prescriptions prior to admission  Medication Sig Dispense Refill  . ADVAIR DISKUS 250-50 MCG/DOSE AEPB INHALE ONE (1) PUFF(S) TWICE DAILY RINSE MOUTH  60 each  4  . albuterol (PROVENTIL HFA) 108 (90 BASE) MCG/ACT inhaler Inhale 2 puffs into the lungs every 6 (six) hours as needed for wheezing or shortness of breath. For shortness of breath and wheezing  1 Inhaler  prn  . albuterol (PROVENTIL) (2.5 MG/3ML) 0.083% nebulizer solution Take 3 mLs (2.5 mg total) by nebulization every 6 (six) hours as needed for wheezing or shortness of breath.  75 mL  12  . atorvastatin (LIPITOR) 80 MG tablet Take 80 mg by mouth daily.      . clopidogrel (PLAVIX) 75 MG tablet Take 1 tablet by mouth daily.      . diazepam (VALIUM) 5 MG  tablet Take 0.5 tablets by mouth as needed. .5 to 1 tablet prn for muscle cramps      . diclofenac (VOLTAREN) 75 MG EC tablet Take 75 mg by mouth daily as needed. For muscle aches      . esomeprazole (NEXIUM) 40 MG capsule Take 40 mg by mouth daily before breakfast.        . fluconazole (DIFLUCAN) 150 MG tablet Take 150 mg by mouth as needed.      . furosemide (LASIX) 40 MG tablet Take 40 mg by mouth daily as needed. For fluid retention      . guaiFENesin (MUCINEX) 600 MG 12 hr tablet Take 1,200 mg by mouth 2 (two) times daily as needed.       Marland Kitchen HYDROcodone-homatropine (HYDROMET) 5-1.5 MG/5ML syrup Take 5 mLs by mouth every 6 (six) hours as needed for cough.  240 mL  0  . Insulin Detemir (LEVEMIR FLEXPEN [place]) Inject 15 Units into the skin at bedtime.       . INVOKANA 100 MG TABS Take 100 mg by mouth daily before breakfast.       . isosorbide mononitrate (IMDUR) 120 MG 24 hr tablet Take 1 tablet (120 mg total) by mouth daily.  30 tablet  11  . levothyroxine (SYNTHROID, LEVOTHROID) 200 MCG tablet Take 200 mcg by mouth daily.      Marland Kitchen  metFORMIN (GLUMETZA) 1000 MG (MOD) 24 hr tablet Take 1,000 mg by mouth 2 (two) times daily with a meal.        . metoprolol succinate (TOPROL-XL) 25 MG 24 hr tablet Take 25 mg by mouth daily.        Marland Kitchen NITROSTAT 0.4 MG SL tablet Place 1 tablet under the tongue as needed.      Marland Kitchen NOVOTWIST 32G X 5 MM MISC Use as directed      . omega-3 acid ethyl esters (LOVAZA) 1 G capsule TAKE 2 CAPSULES (2 G TOTAL) BY MOUTH 2 (TWO) TIMES DAILY.  120 capsule  7  . ONE TOUCH ULTRA TEST test strip Use as directed      . triamterene-hydrochlorothiazide (DYAZIDE) 37.5-25 MG per capsule Take 1 capsule by mouth every morning.        . verapamil (COVERA HS) 240 MG (CO) 24 hr tablet Take 240 mg by mouth daily.         Assessment: 58 y/o F underwent nuclear stress test 10/22 and experienced CP .  Preliminary results reveal severe ischemia in the mid and distal anterior wall and apex. There is  also possible transient ischemic dilatation. She has known h/o CAD with stend in 2007. Admit for ischemic w/u and start IV heparin.   Goal of Therapy:  Heparin level 0.3-0.7 units/ml Monitor platelets by anticoagulation protocol: Yes   Plan:  Baseline labs. Heparin 4000 unit IV bolus Heparin infusion 700 units/hr Check heparin level and CBC in 6 hrs and daily.   Chinenye Katzenberger S. Alford [place], PharmD, BCPS Clinical Staff Pharmacist Pager [phone]  Wayland Salinas 05/29/2014,2:13 PM

## 2014-05-29 NOTE — H&P (View-Only) (Signed)
HPI: FU chest pain. Patient is followed by Dr. Claiborne Billings. Seen recently with chest pain. She had a nuclear study in the office today. Preliminary results reveal severe ischemia in the mid and distal anterior wall and apex. There is also possible transient ischemic dilatation. There were also electric cardiographic changes. She was added to my schedule for further evaluation. She has known coronary artery disease and in 2007 underwent insertion of a 3.0x13 mm Cypher DES stent to her proximal RCA. She did have concomitant CAD with 60-70% stenosis of her LAD and 20% circumflex stenosis. In July 2010 she develop recurrent chest pain and repeat catheterization showed 60% LAD stenosis, 20-30% circumflex stenosis and a widely patent RCA stent.  She has a history of hypertension, hyperlipidemia, hypothyroidism, diabetes mellitus, asthma, GERD, as well as intermittent leg swelling. Patient states she has had chest pain intermittently since the summer. It is in the left chest area. It occurs both with exertion and at rest. She had some chest pain on the treadmill today. Her last chest pain at rest was 2 days ago. She has mild dyspnea on exertion but no orthopnea, PND or pedal edema. No syncope.   Current Outpatient Prescriptions  Medication Sig Dispense Refill  . ADVAIR DISKUS 250-50 MCG/DOSE AEPB INHALE ONE (1) PUFF(S) TWICE DAILY RINSE MOUTH  60 each  4  . albuterol (PROVENTIL HFA) 108 (90 BASE) MCG/ACT inhaler Inhale 2 puffs into the lungs every 6 (six) hours as needed for wheezing or shortness of breath. For shortness of breath and wheezing  1 Inhaler  prn  . albuterol (PROVENTIL) (2.5 MG/3ML) 0.083% nebulizer solution Take 3 mLs (2.5 mg total) by nebulization every 6 (six) hours as needed for wheezing or shortness of breath.  75 mL  12  . atorvastatin (LIPITOR) 80 MG tablet Take 80 mg by mouth daily.      . clopidogrel (PLAVIX) 75 MG tablet Take 1 tablet by mouth daily.      . diazepam (VALIUM) 5 MG  tablet Take 0.5 tablets by mouth as needed. .5 to 1 tablet prn for muscle cramps      . diclofenac (VOLTAREN) 75 MG EC tablet Take 75 mg by mouth daily as needed. For muscle aches      . esomeprazole (NEXIUM) 40 MG capsule Take 40 mg by mouth daily before breakfast.        . fluconazole (DIFLUCAN) 150 MG tablet Take 150 mg by mouth as needed.      . furosemide (LASIX) 40 MG tablet Take 40 mg by mouth daily as needed. For fluid retention      . guaiFENesin (MUCINEX) 600 MG 12 hr tablet Take 1,200 mg by mouth 2 (two) times daily as needed.       Marland Kitchen HYDROcodone-homatropine (HYDROMET) 5-1.5 MG/5ML syrup Take 5 mLs by mouth every 6 (six) hours as needed for cough.  240 mL  0  . Insulin Detemir (LEVEMIR FLEXPEN [place]) Inject 15 Units into the skin at bedtime.       . INVOKANA 100 MG TABS Take 100 mg by mouth daily before breakfast.       . isosorbide mononitrate (IMDUR) 120 MG 24 hr tablet Take 1 tablet (120 mg total) by mouth daily.  30 tablet  11  . levothyroxine (SYNTHROID, LEVOTHROID) 200 MCG tablet Take 200 mcg by mouth daily.      . metFORMIN (GLUMETZA) 1000 MG (MOD) 24 hr tablet Take 1,000 mg by mouth 2 (two)  times daily with a meal.        . metoprolol succinate (TOPROL-XL) 25 MG 24 hr tablet Take 25 mg by mouth daily.        Marland Kitchen NITROSTAT 0.4 MG SL tablet Place 1 tablet under the tongue as needed.      Marland Kitchen NOVOTWIST 32G X 5 MM MISC Use as directed      . omega-3 acid ethyl esters (LOVAZA) 1 G capsule TAKE 2 CAPSULES (2 G TOTAL) BY MOUTH 2 (TWO) TIMES DAILY.  120 capsule  7  . ONE TOUCH ULTRA TEST test strip Use as directed      . triamterene-hydrochlorothiazide (DYAZIDE) 37.5-25 MG per capsule Take 1 capsule by mouth every morning.        . verapamil (COVERA HS) 240 MG (CO) 24 hr tablet Take 240 mg by mouth daily.        No current facility-administered medications for this visit.     Past Medical History  Diagnosis Date  . Unspecified hypothyroidism   . Cough   . Acute bronchitis   .  Obstructive sleep apnea (adult) (pediatric)   . Coronary atherosclerosis   . Type II or unspecified type diabetes mellitus without mention of complication, not stated as uncontrolled     Past Surgical History  Procedure Laterality Date  . Tubal ligation    . Anterior fusion cervical spine      History   Social History  . Marital Status: Widowed    Spouse Name: N/A    Number of Children: N/A  . Years of Education: N/A   Occupational History  . retired    Social History Main Topics  . Smoking status: Former Smoker -- 0.10 packs/day for 30 years    Quit date: 08/09/2003  . Smokeless tobacco: Never Used  . Alcohol Use: No  . Drug Use: No  . Sexual Activity: Not on file   Other Topics Concern  . Not on file   Social History Narrative  . No narrative on file    ROS: no fevers or chills, productive cough, hemoptysis, dysphasia, odynophagia, melena, hematochezia, dysuria, hematuria, rash, seizure activity, orthopnea, PND, pedal edema, claudication. Remaining systems are negative.  Physical Exam: Well-developed well-nourished in no acute distress.  Skin is warm and dry.  HEENT is normal.  Neck is supple.  Chest is clear to auscultation with normal expansion.  Cardiovascular exam is regular rate and rhythm.  Abdominal exam nontender or distended. No masses palpated. Extremities show no edema. neuro grossly intact   Electrocardiogram shows sinus rhythm with nonspecific ST changes.

## 2014-05-30 DIAGNOSIS — R931 Abnormal findings on diagnostic imaging of heart and coronary circulation: Secondary | ICD-10-CM

## 2014-05-30 DIAGNOSIS — I25110 Atherosclerotic heart disease of native coronary artery with unstable angina pectoris: Secondary | ICD-10-CM | POA: Diagnosis not present

## 2014-05-30 DIAGNOSIS — I200 Unstable angina: Secondary | ICD-10-CM

## 2014-05-30 DIAGNOSIS — I10 Essential (primary) hypertension: Secondary | ICD-10-CM

## 2014-05-30 DIAGNOSIS — E785 Hyperlipidemia, unspecified: Secondary | ICD-10-CM

## 2014-05-30 LAB — CBC
HCT: 33.1 % — ABNORMAL LOW (ref 36.0–46.0)
Hemoglobin: 10.4 g/dL — ABNORMAL LOW (ref 12.0–15.0)
MCH: 23.5 pg — AB (ref 26.0–34.0)
MCHC: 31.4 g/dL (ref 30.0–36.0)
MCV: 74.9 fL — AB (ref 78.0–100.0)
PLATELETS: 302 10*3/uL (ref 150–400)
RBC: 4.42 MIL/uL (ref 3.87–5.11)
RDW: 16.7 % — ABNORMAL HIGH (ref 11.5–15.5)
WBC: 7.5 10*3/uL (ref 4.0–10.5)

## 2014-05-30 LAB — COMPREHENSIVE METABOLIC PANEL
ALK PHOS: 72 U/L (ref 39–117)
ALT: 12 U/L (ref 0–35)
AST: 17 U/L (ref 0–37)
Albumin: 3.3 g/dL — ABNORMAL LOW (ref 3.5–5.2)
Anion gap: 14 (ref 5–15)
BUN: 8 mg/dL (ref 6–23)
CALCIUM: 8.8 mg/dL (ref 8.4–10.5)
CO2: 23 meq/L (ref 19–32)
Chloride: 104 mEq/L (ref 96–112)
Creatinine, Ser: 0.86 mg/dL (ref 0.50–1.10)
GFR calc non Af Amer: 73 mL/min — ABNORMAL LOW (ref >90)
GFR, EST AFRICAN AMERICAN: 85 mL/min — AB (ref >90)
GLUCOSE: 100 mg/dL — AB (ref 70–99)
POTASSIUM: 4 meq/L (ref 3.7–5.3)
SODIUM: 141 meq/L (ref 137–147)
TOTAL PROTEIN: 7.6 g/dL (ref 6.0–8.3)
Total Bilirubin: 0.2 mg/dL — ABNORMAL LOW (ref 0.3–1.2)

## 2014-05-30 LAB — GLUCOSE, CAPILLARY: Glucose-Capillary: 104 mg/dL — ABNORMAL HIGH (ref 70–99)

## 2014-05-30 LAB — LIPID PANEL
Cholesterol: 108 mg/dL (ref 0–200)
HDL: 40 mg/dL (ref >39)
LDL CALC: 37 mg/dL (ref 0–99)
TRIGLYCERIDES: 155 mg/dL — AB (ref <150)
Total CHOL/HDL Ratio: 2.7 RATIO
VLDL: 31 mg/dL (ref 0–40)

## 2014-05-30 LAB — HEPARIN LEVEL (UNFRACTIONATED): Heparin Unfractionated: <0.1 IU/mL — ABNORMAL LOW (ref 0.30–0.70)

## 2014-05-30 MED ORDER — ASPIRIN 81 MG PO CHEW: 81.0000 mg | CHEWABLE_TABLET | Freq: Every day | ORAL | Status: DC

## 2014-05-30 MED ORDER — METFORMIN HCL ER (MOD) 1000 MG PO TB24: 1000.0000 mg | ORAL_TABLET | Freq: Two times a day (BID) | ORAL | Status: DC

## 2014-05-30 MED ORDER — LEVOTHYROXINE SODIUM 200 MCG PO TABS
200.0000 ug | ORAL_TABLET | Freq: Every day | ORAL | Status: DC
  Filled 2014-05-30 (×2): qty 1

## 2014-05-30 NOTE — Discharge Summary (Signed)
CARDIOLOGY DISCHARGE SUMMARY   Patient ID: [name] MRN: [id number] DOB/AGE: July 09, 1956 58 y.o.  Admit date: 05/29/2014 Discharge date: 05/30/2014  PCP: Sheela Stack, MD Primary Cardiologist: Dr. Claiborne Billings  Primary Discharge Diagnosis:    Unstable angina - s/p cath w/ med rx for CAD, no PCI this admit Secondary Discharge Diagnosis:    Abnormal nuclear stress test - false positive   Anemia, low MCV  Procedures: 1. Left heart catheterization; 2. Coronary angiography; 3. Left ventriculography; 4. FFR LAD   Hospital Course: [name] is a 58 y.o. female with a history of CAD. Her RCA had been previously stented and she had moderate LAD disease by previous catheterization. She had a stress test on the day of admission and it showed severe ischemia in the mid and distal anterior wall and apex as well as electrocardiographic changes. She was seen by Dr. Stanford Breed in the office and cardiac catheterization was recommended. [facility] and taken to the Cath Lab.  Cardiac catheterization results are below. Her RCA stent was patent and she had moderate LAD disease. Fractional flow reserve was performed on the LAD but was not significantly abnormal at 0.94. Therefore, PCI as not indicated. She tolerated the procedure well. She was held overnight for hydration.  On 10/23, she was seen by Dr. Claiborne Billings and all data were reviewed. She was feeling much better and ambulating without chest pain or shortness of breath. She was noted to be mildly anemic and her hemoglobin/hematocrit were in line with previous values. Her MCV was noted to be slightly decreased and she is encouraged to follow up with primary care for this.  Her chest pain had resolved and no further inpatient workup is indicated. She is considered stable for discharge, to follow up as an outpatient.  Labs:   Lab Results  Component Value Date   WBC 7.5 05/30/2014   HGB 10.4* 05/30/2014   HCT 33.1*  05/30/2014   MCV 74.9* 05/30/2014   PLT 302 05/30/2014     Recent Labs Lab 05/30/14 0400  NA 141  K 4.0  CL 104  CO2 23  BUN 8  CREATININE 0.86  CALCIUM 8.8  PROT 7.6  BILITOT 0.2*  ALKPHOS 72  ALT 12  AST 17  GLUCOSE 100*   Lipid Panel     Component Value Date/Time   CHOL 108 05/30/2014 0400   TRIG 155* 05/30/2014 0400   HDL 40 05/30/2014 0400   CHOLHDL 2.7 05/30/2014 0400   VLDL 31 05/30/2014 0400   LDLCALC 37 05/30/2014 0400    Pro B Natriuretic peptide (BNP)  Date/Time Value Ref Range Status  06/01/2008  1:52 AM 55.0   Final    Recent Labs  05/29/14 1458  INR 1.04   Cardiac Cath: 05/29/2014  HEMODYNAMICS: Aortic pressure 159/56 mmHg; LV pressure 159/9 mmHg; LVEDP 18 mm mercury  ANGIOGRAPHIC DATA: The left main coronary artery is widely patent.  The left anterior descending artery is moderately calcified in the proximal and mid segment there is an eccentric shelflike region of narrowing extending from the first of perforator to the mid vessel. This is similar to prior angiographic appearance with 50% narrowing. Scattered irregularities are noted further distally and near the apex. The first diagonal contains eccentric 70% narrowing. The territory supplied a small..  The left circumflex artery is a large vessel. It gives origin to 3 obtuse marginal branches. Eccentric narrowing is noted in the circumflex beyond the first obtuse  marginal that obstructs the vessel by up to 30%. Further distally beyond the origin of the second obtuse marginal there is 40% narrowing. The proximal portion of the second obtuse marginal contains 30-40% narrowing..  The right coronary artery is widely patent including the stent that is present in the proximal segment. Small PDA arises.  PCI RESULTS: As described above, PCI was not performed. FFR of the proximal to mid segmental LAD stenosis was 0.94.  LEFT VENTRICULOGRAM: Left ventricular angiogram was done in the 30 RAO projection and  revealed a normal cavity size with estimated ejection fraction of 60%.  IMPRESSIONS: 1. Widely patent right coronary stent  2. Segmental 50% proximal to mid LAD stenosis with FFR of 0.94. The angiographic appearance is unchanged from 2010.  3. False positive myocardial perfusion study  4. Normal left ventricular function  5. Irregularities noted in the circumflex  RECOMMENDATION: Continued medical therapy, which include aggressive risk factor modification. There is no indication for intervention on the LAD which supplies a territory that caused the appearance of ischemia on nuclear scintigraphy. It would appear this time that the study was false positive. Other explanations might include vasovagal reactive blood flow disturbance during the procedure with coronary spasm being induced. In the cath lab adenosine perfusion did not demonstrate evidence of hemodynamic significance in the proximal to mid LAD stenosis.   EKG: 05/29/2014 SR, no acute ischemic changes  FOLLOW UP PLANS AND APPOINTMENTS Allergies  Allergen Reactions  . Codeine     REACTION: hallucinations/"loopy"  . Sulfonamide Derivatives Hives  . Sulfa Antibiotics Hives     Medication List         albuterol 108 (90 BASE) MCG/ACT inhaler  Commonly known as:  PROVENTIL HFA  Inhale 2 puffs into the lungs every 6 (six) hours as needed for wheezing or shortness of breath. For shortness of breath and wheezing     albuterol (2.5 MG/3ML) 0.083% nebulizer solution  Commonly known as:  PROVENTIL  Take 3 mLs (2.5 mg total) by nebulization every 6 (six) hours as needed for wheezing or shortness of breath.     aspirin 81 MG chewable tablet  Chew 1 tablet (81 mg total) by mouth daily.     atorvastatin 80 MG tablet  Commonly known as:  LIPITOR  Take 80 mg by mouth every evening.     clopidogrel 75 MG tablet  Commonly known as:  PLAVIX  Take 1 tablet by mouth daily.     diazepam 5 MG tablet  Commonly known as:  VALIUM  Take 0.5  tablets by mouth as needed. .5 to 1 tablet prn for muscle cramps     diclofenac 75 MG EC tablet  Commonly known as:  VOLTAREN  Take 75 mg by mouth daily as needed. For muscle aches     esomeprazole 40 MG capsule  Commonly known as:  NEXIUM  Take 40 mg by mouth daily before breakfast.     Fluticasone-Salmeterol 250-50 MCG/DOSE Aepb  Commonly known as:  ADVAIR  Inhale 2 puffs into the lungs 2 (two) times daily.     INVOKANA 100 MG Tabs  Generic drug:  Canagliflozin  Take 100 mg by mouth daily before breakfast.     isosorbide mononitrate 120 MG 24 hr tablet  Commonly known as:  IMDUR  Take 1 tablet (120 mg total) by mouth daily.     LEVEMIR FLEXPEN [place]  Inject 15 Units into the skin at bedtime.     levothyroxine 200 MCG tablet  Commonly known as:  SYNTHROID, LEVOTHROID  Take 200 mcg by mouth daily.     metFORMIN 1000 MG (MOD) 24 hr tablet  Commonly known as:  GLUMETZA  Take 1 tablet (1,000 mg total) by mouth 2 (two) times daily with a meal. HOLD for 48 hours, restart on 06/01/2014.     metoprolol succinate 25 MG 24 hr tablet  Commonly known as:  TOPROL-XL  Take 25 mg by mouth daily.     NITROSTAT 0.4 MG SL tablet  Generic drug:  nitroGLYCERIN  Place 1 tablet under the tongue every 5 (five) minutes as needed for chest pain.     NOVOTWIST 32G X 5 MM Misc  Generic drug:  Insulin Pen Needle  Use as directed     omega-3 acid ethyl esters 1 G capsule  Commonly known as:  LOVAZA  Take 2 g by mouth 2 (two) times daily.     ONE TOUCH ULTRA TEST test strip  Generic drug:  glucose blood  Use as directed     triamterene-hydrochlorothiazide 37.5-25 MG per capsule  Commonly known as:  DYAZIDE  Take 1 capsule by mouth every morning.        Discharge Instructions   Diet - low sodium heart healthy    Complete by:  As directed      Diet Carb Modified    Complete by:  As directed      Increase activity slowly    Complete by:  As directed           Follow-up Information     Follow up with Troy Sine, MD On 06/04/2014. (Keep appt.)    Specialty:  Cardiology   Contact information:   [address] [place] [address] [place] [address] [phone]       BRING ALL MEDICATIONS WITH YOU TO FOLLOW UP APPOINTMENTS  Time spent with patient to include physician time: 36 min Signed: Rosaria Ferries, PA-C 05/30/2014, 3:56 PM Co-Sign MD

## 2014-05-30 NOTE — Discharge Instructions (Signed)
PLEASE REMEMBER TO BRING ALL OF YOUR MEDICATIONS TO EACH OF YOUR FOLLOW-UP OFFICE VISITS. ° °PLEASE ATTEND ALL SCHEDULED FOLLOW-UP APPOINTMENTS.  ° °Activity: Increase activity slowly as tolerated. You may shower, but no soaking baths (or swimming) for 1 week. No driving for 2 days. No lifting over 5 lbs for 1 week. No sexual activity for 1 week.  ° °You May Return to Work: in 1 week (if applicable) ° °Wound Care: You may wash cath site gently with soap and water. Keep cath site clean and dry. If you notice pain, swelling, bleeding or pus at your cath site, please call [phone]. ° ° ° °Cardiac Cath Site Care °Refer to this sheet in the next few weeks. These instructions provide you with information on caring for yourself after your procedure. Your caregiver may also give you more specific instructions. Your treatment has been planned according to current medical practices, but problems sometimes occur. Call your caregiver if you have any problems or questions after your procedure. °HOME CARE INSTRUCTIONS °· You may shower 24 hours after the procedure. Remove the bandage (dressing) and gently wash the site with plain soap and water. Gently pat the site dry.  °· Do not apply powder or lotion to the site.  °· Do not sit in a bathtub, swimming pool, or whirlpool for 5 to 7 days.  °· No bending, squatting, or lifting anything over 10 pounds (4.5 kg) as directed by your caregiver.  °· Inspect the site at least twice daily.  °· Do not drive home if you are discharged the same day of the procedure. Have someone else drive you.  °· You may drive 24 hours after the procedure unless otherwise instructed by your caregiver.  °What to expect: °· Any bruising will usually fade within 1 to 2 weeks.  °· Blood that collects in the tissue (hematoma) may be painful to the touch. It should usually decrease in size and tenderness within 1 to 2 weeks.  °SEEK IMMEDIATE MEDICAL CARE IF: °· You have unusual pain at the site or down the  affected limb.  °· You have redness, warmth, swelling, or pain at the site.  °· You have drainage (other than a small amount of blood on the dressing).  °· You have chills.  °· You have a fever or persistent symptoms for more than 72 hours.  °· You have a fever and your symptoms suddenly get worse.  °· Your leg becomes pale, cool, tingly, or numb.  °· You have heavy bleeding from the site. Hold pressure on the site.  °Document Released: 08/27/2010 Document Revised: 07/14/2011 Document Reviewed: 08/27/2010 °[employer]® Patient Information ©2012 [employer]. ° °

## 2014-05-30 NOTE — Progress Notes (Signed)
Pt refused HS meds (except Aspirin) during this shift. Pt states some of these meds are taken in the AM, not at night. Pt also states she does not want to pay for these meds because she will be discharged in the AM and will take them at home. Will continue to monitor BP.

## 2014-05-30 NOTE — Progress Notes (Signed)
Patient [name] Date of Encounter: 05/30/2014  Active Problems:   Unstable angina   Abnormal nuclear stress test    Patient Profile: 58 yo female admitted from office 10/22 w/ abnl nuc stress, cath w/ FFR LAD, no critical CAD, med rx  SUBJECTIVE: Feels well, ready for d/c.  OBJECTIVE Filed Vitals:   05/29/14 2234 05/29/14 2329 05/30/14 0213 05/30/14 0537  BP: 152/78 131/46 119/80 139/60  Pulse: 69 80 62 66  Temp: 98.4 F (36.9 C) 98.2 F (36.8 C) 98 F (36.7 C) 97.8 F (36.6 C)  TempSrc:   Oral Oral  Resp:   18 18  Height:      Weight:    164 lb 3.9 oz (74.5 kg)  SpO2:  97% 100% 97%    Intake/Output Summary (Last 24 hours) at 05/30/14 0923 Last data filed at 05/30/14 0540  Gross per 24 hour  Intake 275.38 ml  Output   1850 ml  Net -1574.62 ml   Filed Weights   05/29/14 1308 05/30/14 0537  Weight: 165 lb (74.844 kg) 164 lb 3.9 oz (74.5 kg)    PHYSICAL EXAM General: Well developed, well nourished, female in no acute distress. Head: Normocephalic, atraumatic.  Neck: Supple without bruits, JVD not elevated. Lungs:  Resp regular and unlabored, CTA. Heart: RRR, S1, S2, no S3, S4, or murmur; no rub. Abdomen: Soft, non-tender, non-distended, BS + x 4.  Extremities: No clubbing, cyanosis, no edema. Right radial w/ some ecchymosis, no hematoma Neuro: Alert and oriented X 3. Moves all extremities spontaneously. Psych: Normal affect.  LABS: CBC: Recent Labs  05/29/14 1458 05/30/14 0400  WBC 8.4 7.5  HGB 10.3* 10.4*  HCT 32.2* 33.1*  MCV 74.9* 74.9*  PLT 295 302   INR: Recent Labs  05/29/14 1458  INR [id number]   Basic Metabolic Panel: Recent Labs  05/29/14 1458 05/30/14 0400  NA 140 141  K 3.7 4.0  CL 102 104  CO2 27 23  GLUCOSE 66* 100*  BUN 6 8  CREATININE 0.85 0.86  CALCIUM 9.2 8.8   Liver Function Tests: Recent Labs  05/30/14 0400  AST 17  ALT 12  ALKPHOS 72  BILITOT 0.2*  PROT 7.6  ALBUMIN 3.3*   Hemoglobin  A1C: Recent Labs  05/29/14 1458  HGBA1C 6.6*   Fasting Lipid Panel: Recent Labs  05/30/14 0400  CHOL 108  HDL 40  LDLCALC 37  TRIG 155*  CHOLHDL 2.7   TELE: SR     Cardiac Cath: 05/29/2014 HEMODYNAMICS: Aortic pressure 159/56 mmHg; LV pressure 159/9 mmHg; LVEDP 18 mm mercury  ANGIOGRAPHIC DATA: The left main coronary artery is widely patent.  The left anterior descending artery is moderately calcified in the proximal and mid segment there is an eccentric shelflike region of narrowing extending from the first of perforator to the mid vessel. This is similar to prior angiographic appearance with 50% narrowing. Scattered irregularities are noted further distally and near the apex. The first diagonal contains eccentric 70% narrowing. The territory supplied a small..  The left circumflex artery is a large vessel. It gives origin to 3 obtuse marginal branches. Eccentric narrowing is noted in the circumflex beyond the first obtuse marginal that obstructs the vessel by up to 30%. Further distally beyond the origin of the second obtuse marginal there is 40% narrowing. The proximal portion of the second obtuse marginal contains 30-40% narrowing..  The right coronary artery is widely patent including the stent that is present  in the proximal segment. Small PDA arises.  PCI RESULTS: As described above, PCI was not performed. FFR of the proximal to mid segmental LAD stenosis was 0.94.  LEFT VENTRICULOGRAM: Left ventricular angiogram was done in the 30 RAO projection and revealed a normal cavity size with estimated ejection fraction of 60%.  IMPRESSIONS: 1. Widely patent right coronary stent  2. Segmental 50% proximal to mid LAD stenosis with FFR of 0.94. The angiographic appearance is unchanged from 2010.  3. False positive myocardial perfusion study  4. Normal left ventricular function  5. Irregularities noted in the circumflex  RECOMMENDATION: Continued medical therapy, which include aggressive  risk factor modification. There is no indication for intervention on the LAD which supplies a territory that caused the appearance of ischemia on nuclear scintigraphy. It would appear this time that the study was false positive. Other explanations might include vasovagal reactive blood flow disturbance during the procedure with coronary spasm being induced. In the cath lab adenosine perfusion did not demonstrate evidence of hemodynamic significance in the proximal to mid LAD stenosis.  Current Medications:  . aspirin  81 mg Oral Daily  . atorvastatin  80 mg Oral Daily  . Canagliflozin  100 mg Oral QAC breakfast  . clopidogrel  75 mg Oral Daily  . insulin aspart  0-15 Units Subcutaneous TID WC  . isosorbide mononitrate  120 mg Oral Daily  . levothyroxine  200 mcg Oral Daily  . metoprolol succinate  25 mg Oral Daily  . mometasone-formoterol  2 puff Inhalation BID  . omega-3 acid ethyl esters  2 g Oral BID  . pantoprazole  40 mg Oral Daily  . triamterene-hydrochlorothiazide  1 capsule Oral q morning - 10a   ASSESSMENT AND PLAN: Active Problems:   Unstable angina - s/p cath w/ non-critical dz, results above, med rx w/ good CRF control. MD advise on increasing Toprol XL to 25 mg BID, HR rarely 50s, SBP generally > 140. Continue statin.    Abnormal nuclear stress test  Plan - d/c today.  SignedRosaria Ferries , PA-C 9:23 AM 05/30/2014   Patient seen and examined. Agree with assessment and plan. Nuclear study and cath reviewed. Mod LAD disease with FFR .94; medical therapy. RCA stent patent. DC today.   Troy Sine, MD, [facility] 05/30/2014 9:57 AM

## 2014-05-30 NOTE — Progress Notes (Signed)
Patient given discharge instructions and all questions answered.  Pt. Taken out with all belongings.

## 2014-06-04 ENCOUNTER — Ambulatory Visit: Payer: 59 | Admitting: Cardiovascular Disease

## 2014-07-04 ENCOUNTER — Telehealth: Payer: Self-pay | Admitting: Internal Medicine

## 2014-07-04 MED ORDER — AZITHROMYCIN 250 MG PO TABS: ORAL_TABLET | ORAL | Status: DC

## 2014-07-04 NOTE — Telephone Encounter (Signed)
I spoke with the pt and she states she is having chest congestion, head congestion, laryngitis, productive cough with yellow phelgm, and wheezing x 1.5 weeks. She denies any chest tightness or SOB. Pt states she has been taking mucinex, sudafed, and tylenol without relief. Pt is requesting an rx for antibiotics and a cough syrup that can be called in. Please advise. [place], CMA Allergies  Allergen Reactions  . Codeine     REACTION: hallucinations/"loopy"  . Sulfonamide Derivatives Hives  . Sulfa Antibiotics Hives    Current Outpatient Prescriptions on File Prior to Visit  Medication Sig Dispense Refill  . albuterol (PROVENTIL HFA) 108 (90 BASE) MCG/ACT inhaler Inhale 2 puffs into the lungs every 6 (six) hours as needed for wheezing or shortness of breath. For shortness of breath and wheezing 1 Inhaler prn  . albuterol (PROVENTIL) (2.5 MG/3ML) 0.083% nebulizer solution Take 3 mLs (2.5 mg total) by nebulization every 6 (six) hours as needed for wheezing or shortness of breath. 75 mL 12  . aspirin 81 MG chewable tablet Chew 1 tablet (81 mg total) by mouth daily.    Marland Kitchen atorvastatin (LIPITOR) 80 MG tablet Take 80 mg by mouth every evening.     . clopidogrel (PLAVIX) 75 MG tablet Take 1 tablet by mouth daily.    . diazepam (VALIUM) 5 MG tablet Take 0.5 tablets by mouth as needed. .5 to 1 tablet prn for muscle cramps    . diclofenac (VOLTAREN) 75 MG EC tablet Take 75 mg by mouth daily as needed. For muscle aches    . esomeprazole (NEXIUM) 40 MG capsule Take 40 mg by mouth daily before breakfast.      . Fluticasone-Salmeterol (ADVAIR) 250-50 MCG/DOSE AEPB Inhale 2 puffs into the lungs 2 (two) times daily.    . Insulin Detemir (LEVEMIR FLEXPEN [place]) Inject 15 Units into the skin at bedtime.     . INVOKANA 100 MG TABS Take 100 mg by mouth daily before breakfast.     . isosorbide mononitrate (IMDUR) 120 MG 24 hr tablet Take 1 tablet (120 mg total) by mouth daily. 30 tablet 11  . levothyroxine  (SYNTHROID, LEVOTHROID) 200 MCG tablet Take 200 mcg by mouth daily.    . metFORMIN (GLUMETZA) 1000 MG (MOD) 24 hr tablet Take 1 tablet (1,000 mg total) by mouth 2 (two) times daily with a meal. HOLD for 48 hours, restart on 06/01/2014.    . metoprolol succinate (TOPROL-XL) 25 MG 24 hr tablet Take 25 mg by mouth daily.      Marland Kitchen NITROSTAT 0.4 MG SL tablet Place 1 tablet under the tongue every 5 (five) minutes as needed for chest pain.     Marland Kitchen NOVOTWIST 32G X 5 MM MISC Use as directed    . omega-3 acid ethyl esters (LOVAZA) 1 G capsule Take 2 g by mouth 2 (two) times daily.    . ONE TOUCH ULTRA TEST test strip Use as directed    . triamterene-hydrochlorothiazide (DYAZIDE) 37.5-25 MG per capsule Take 1 capsule by mouth every morning.       No current facility-administered medications on file prior to visit.

## 2014-07-04 NOTE — Telephone Encounter (Signed)
Pt aware of recs.  zpak sent in.  Nothing further needed.

## 2014-07-04 NOTE — Telephone Encounter (Signed)
Offer Zpak   Only codeine cough syrups can be called in, and she is allergic to codeine. Suggest otc Delsym this weekend while we are closed.

## 2014-07-17 ENCOUNTER — Encounter (HOSPITAL_COMMUNITY): Payer: Self-pay | Admitting: Interventional Cardiology

## 2014-07-28 ENCOUNTER — Ambulatory Visit: Payer: 59 | Admitting: Internal Medicine

## 2014-07-28 ENCOUNTER — Other Ambulatory Visit: Payer: Self-pay | Admitting: Cardiovascular Disease

## 2014-07-28 NOTE — Telephone Encounter (Signed)
Rx refill sent to patient pharmacy   

## 2014-08-04 ENCOUNTER — Ambulatory Visit (INDEPENDENT_AMBULATORY_CARE_PROVIDER_SITE_OTHER): Payer: 59 | Admitting: Internal Medicine

## 2014-08-04 VITALS — BP 110/60 | HR 80 | Temp 98.2°F | Ht 62.0 in | Wt 167.8 lb

## 2014-08-04 DIAGNOSIS — R059 Cough, unspecified: Secondary | ICD-10-CM

## 2014-08-04 DIAGNOSIS — G4733 Obstructive sleep apnea (adult) (pediatric): Secondary | ICD-10-CM

## 2014-08-04 DIAGNOSIS — R05 Cough: Secondary | ICD-10-CM

## 2014-08-04 DIAGNOSIS — J209 Acute bronchitis, unspecified: Secondary | ICD-10-CM

## 2014-08-04 NOTE — Progress Notes (Signed)
Patient ID: [name], female    DOB: 06/18/1956, 58 y.o.   MRN: [id number]  HPI 11/08/10- 31 yo F former smoker, followed here for obstructive sleep apnea and for hx bronchitis.  She was recently treated by her PCP for acute bronchitis. Onset after mowing grass. Still coughing up thick greenish yellow mucus after augmentin they gave her. She thinks she does better with doxycycline. Denies fever, swollen glands, sore throat.  Last seen here May 10, 2010. Hx of allergy testing but never on vaccine. Pollen is bothering with nasal congestion and wheezey cough more this year.  Continues CPAP at 13 cwp all night every night.  Sleeps ok. She asks about alternatives and we discussed palatal surgeries, oral appliances and Provent nasal valves. [name] Twin sister had MI.  05/10/11- 15 yo F former smoker, followed here for obstructive sleep apnea and for hx bronchitis, allergic rhinitis, complicated by DM, hypothyroid, CAD  Since last here, Dr. Claiborne Billings placed a coronary stent. 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  CPAP compliance is good all night every night. She isn't sure about mask and we discussed going [facility].  Wants flu vac. She still feels tight in the chest that she continues Advair 250. Some wheeze. Uses rescue inhaler and asks about getting a nebulizer machine. We had called in a Z-Pak which she says was not strong enough. She denies discolored sputum or fever. We discussed using cortisone and anticipated impact on her insulin-dependent diabetes temporarily. She complains of a "raw" substernal sensation but thinks Nexium controls her reflux well.  08/29/11-  37 yo F former smoker, followed here for obstructive sleep apnea and for hx bronchitis, allergic rhinitis, complicated by DM, hypothyroid, CAD  Acute visit-  deep cough-yellow in color and streaks of blood at times; SOB and wheezing-went to ER Saturday([facility])Lower  back pain  she did have flu vaccine. Last week had GI upset with vomiting and right upper quadrant pain. She still has her gallbladder. That discomfort has eased off. 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 As her GI upset resolved, she developed chest tightness with cough and cold symptoms, fever and chills 3 days ago now resolved. Head and chest are still congested. [facility] his nebulizer treatment x2 started prednisone taper. She  Is finishing doxycycline with 2 days left. CHEST - 2 VIEW 08/27/11-  Comparison: 05/10/2011  Findings: Mild peribronchial thickening. Heart and mediastinal  contours are within normal limits. No focal opacities or  effusions. No acute bony abnormality.  IMPRESSION:  Mild bronchitic changes.  Original Report Authenticated By: [name], M.D.   11/08/11- 62 yo F former smoker, followed here for obstructive sleep apnea and for hx bronchitis, allergic rhinitis, complicated by DM, hypothyroid, CAD  Continues using CPAP 13 almost every night. No sleeping pills. Nebulizer treatment and Depo-Medrol did help her breathing last visit. She would like a home nebulizer which we discussed. Singulair did not help. Blames her cough on postnasal drip or getting too hot. Has history of occasional thrush which might also contribute to throat discomfort.   05/11/2012 Acute OV  Complains of head congestion w/ green drainage, PND, prod cough with gree mucus, hoarseness, increased SOB, wheezing, some tightness x2 weeks - denies f/c/s.  has finished abx and pred thru PCP w/in last 2 weeks Took keflex and steroid taper with minimal  improvement. Has sinus congestion, ear fullness, hoarseness and drainage.  No hemotpysis or chest pain  No edema.  OTC not helping with cough.   06/29/12- 30 yo F former smoker, followed here for obstructive sleep apnea and for hx bronchitis, allergic rhinitis, complicated by DM, hypothyroid,  CAD  FOLLOWS FOR: still having cough and feels fullness in chest coming back since seeign TP.; discomfort on right side lung area CPAP 13 Feeling better after she was seen by the nurse practitioner October 4. Now has caught a new cold. Sneezing. Hard cough caused sharp pain right lateral rib. Area still hurts to cough. Scant phlegm, mild sore throat, no fever but did have some chills originally. Took Augmentin then amoxicillin used for dental prophylaxis. CXR 05/11/12-reviewed IMPRESSION:  No acute cardiopulmonary abnormality.  Original Report Authenticated By: [name], M.D.   11/06/12- 55 yo F former smoker, followed here for obstructive sleep apnea and for hx bronchitis, allergic rhinitis, complicated by DM, hypothyroid, CAD , OSA FOLLOW FOR:c/o sinus congestion,chest tightness x 4 days,mild sob w exertion,,cough and nasal congestion yellow,,right ear stopped up and hurting,Finished mouthwash that was called in 2 wks. Ago Eyes itch, postnasal drip, malaise x 4 days. Amoxacillin 2 weeks ago for ?strep throat. Diazepam for cramps.  CPAP 13/ [facility].   06/18/13- 17 yo F former smoker, followed here for obstructive sleep apnea and for hx bronchitis, allergic rhinitis, complicated by DM, hypothyroid, CAD , OSA FOLLOWS FOR: Breathing is slightly improved. Reports SOB and wheezing. Denies chest tightness today. Is producing green mucus from nose. Denies fever or sore throat. Using Mucinex and saline rinse. Had flu vaccine. CPAP 13/ [facility] is not being used as much as she should. Using Nexium, feels no reflux or heartburn  01/23/14- 38 yo F former smoker, followed here for obstructive sleep apnea and for hx bronchitis, allergic rhinitis, complicated by DM, hypothyroid, CAD , OSA FOLLOWS FOR:  Having increased sob with exertion, wheezing and cough with yellow mucus x1 month.  Pt has done round of anitbiotics and pred shot twice. CPAP/13/[facility] Onset of sinusitis in March with extension to  chest/bronchitis. Urgent care treated with steroid shot, antibiotic shot and then cephalosporin. She started itching attributed to the antibiotic. Chest x-ray was clear. She was given repeat steroid and antibiotic injections. Still blowing green from her nose. Using Sudafed and saline nasal rinse.no fever or headache recognized CXR 09/02/13 IMPRESSION:  No acute disease.  Electronically Signed  By: Inge Rise M.D.  On: 09/02/2013 20:28  08/04/14- 63 yo F former smoker, followed here for obstructive sleep apnea and for hx bronchitis, allergic rhinitis, complicated by DM, hypothyroid, CAD , OSA FOLLOWS FOR: DME [id number] had respiratory infection over Thanksgiving. Dr. Forde Dandy gave abx (Augmentin) last week and she feels better. She got steroid injection by Dr. Amedeo Plenty last week as well. Patient still on abx, she still has sl cough and wheeze at times. She denies sob at this time . Abnl nuclear stress test then cath- CAD/ stents, no progression CPAP 13/ [facility]- all night every night  ROS-see HPI Constitutional:   No-   weight loss, night sweats, fevers, chills, fatigue, lassitude. HEENT:   No-  headaches, difficulty swallowing, tooth/dental problems, sore throat,      Nosneezing, itching, ear ache, +nasal congestion, post nasal drip,  CV:  No- chest pain, no-orthopnea, PND, swelling in lower extremities, anasarca,  dizziness, palpitations Resp: No-   shortness of breath with exertion or at rest.  No-  productive cough,  + non-productive cough,  No- coughing up of blood.              No-change in color of mucus.  No- wheezing.   Skin: No-   rash or lesions. GI:  No-   heartburn, indigestion, abdominal pain, nausea, vomiting,  GU: . MS:  No-   joint pain or swelling.  . Neuro-     nothing unusual Psych:  No- change in mood or affect. No depression or anxiety.  No memory loss.  Objective:   Physical Exam OBJ- Physical Exam General- Alert, Oriented, Affect-appropriate,  Distress- none acute Skin- rash-none, lesions- none, excoriation- none Lymphadenopathy- none Head- atraumatic            Eyes- + periorbital edema, Gross vision intact, PERRLA, conjunctivae and secretions clear            Ears- Hearing, canals-normal,             Nose- No- turb edema,  no-Septal dev, mucus, polyps, erosion, perforation             Throat- Mallampati II , mucosa clear , drainage- none, tonsils- atrophic Neck- flexible , trachea midline, no stridor , thyroid nl, carotid no bruit Chest - symmetrical excursion , unlabored           Heart/CV- RRR , no murmur , no gallop  , no rub, nl s1 s2                           - JVD- none , edema- none, stasis changes- none, varices- none           Lung-  +Coarse breath sounds, but No-wheeze-, no- cough , dullness-none, rub- none           Chest wall-  Abd-  Br/ Gen/ Rectal- Not done, not indicated Extrem- cyanosis- none, clubbing, none, atrophy- none, strength- nl Neuro- grossly intact to observation

## 2014-08-04 NOTE — Patient Instructions (Signed)
Finish the augmentin, get enough fluids and rest. Ok to use the mucinex and to use your asthma meds if you need to.  Please call if we can help.

## 2014-08-06 ENCOUNTER — Telehealth: Payer: Self-pay | Admitting: Cardiovascular Disease

## 2014-08-06 NOTE — Telephone Encounter (Signed)
Closed Encounter  °

## 2014-08-07 ENCOUNTER — Ambulatory Visit: Payer: 59 | Admitting: Cardiovascular Disease

## 2014-08-10 ENCOUNTER — Encounter: Payer: Self-pay | Admitting: Internal Medicine

## 2014-08-10 DIAGNOSIS — J209 Acute bronchitis, unspecified: Secondary | ICD-10-CM | POA: Insufficient documentation

## 2014-08-10 NOTE — Assessment & Plan Note (Signed)
Recent acute bronchitis component is usually better controlled She still requests narcotic cough syrup-discussed

## 2014-08-10 NOTE — Assessment & Plan Note (Signed)
Treated by her primary physician. Slowly resolving cough consistent with a post viral bronchitis.

## 2014-08-10 NOTE — Assessment & Plan Note (Signed)
Good compliance and control. She works with [place] on mask fit.

## 2014-10-02 ENCOUNTER — Other Ambulatory Visit: Payer: Self-pay | Admitting: Cardiovascular Disease

## 2014-10-02 NOTE — Telephone Encounter (Signed)
Rx has been sent to the pharmacy electronically. ° °

## 2014-10-03 ENCOUNTER — Other Ambulatory Visit: Payer: Self-pay | Admitting: Internal Medicine

## 2014-12-04 ENCOUNTER — Telehealth: Payer: Self-pay | Admitting: Internal Medicine

## 2014-12-04 MED ORDER — GABAPENTIN 100 MG PO CAPS: 100.0000 mg | ORAL_CAPSULE | Freq: Three times a day (TID) | ORAL | Status: DC

## 2014-12-04 NOTE — Telephone Encounter (Signed)
Spoke with pt and advised of Dr Janee Morn recommendations.  Rx for Gabapentin sent to pharmacy.

## 2014-12-04 NOTE — Telephone Encounter (Signed)
Pt c/o cough (occas prod- light yellow) for a month.  Coughs so hard she almost throws up.  Hoarseness from coughing, wheezing, mild sob.  Taking Mucinex and Mucus Relief.  Diff sleeping due to cough.  Please advise.  Allergies  Allergen Reactions  . Codeine     REACTION: hallucinations/"loopy"  . Sulfonamide Derivatives Hives  . Sulfa Antibiotics Hives    Current Outpatient Prescriptions on File Prior to Visit  Medication Sig Dispense Refill  . ADVAIR DISKUS 250-50 MCG/DOSE AEPB INHALE ONE (1) PUFF(S) TWICE DAILY, RINSE MOUTH. 60 each 3  . albuterol (PROVENTIL HFA) 108 (90 BASE) MCG/ACT inhaler Inhale 2 puffs into the lungs every 6 (six) hours as needed for wheezing or shortness of breath. For shortness of breath and wheezing 1 Inhaler prn  . albuterol (PROVENTIL) (2.5 MG/3ML) 0.083% nebulizer solution Take 3 mLs (2.5 mg total) by nebulization every 6 (six) hours as needed for wheezing or shortness of breath. 75 mL 12  . aspirin 81 MG chewable tablet Chew 1 tablet (81 mg total) by mouth daily.    Marland Kitchen atorvastatin (LIPITOR) 80 MG tablet Take 80 mg by mouth every evening.     . clopidogrel (PLAVIX) 75 MG tablet Take 1 tablet by mouth daily.    . diazepam (VALIUM) 5 MG tablet Take 0.5 tablets by mouth as needed. .5 to 1 tablet prn for muscle cramps    . diclofenac (VOLTAREN) 75 MG EC tablet Take 75 mg by mouth daily as needed. For muscle aches    . Diclofenac Sodium CR 100 MG 24 hr tablet Take 100 mg by mouth 2 (two) times daily.    Marland Kitchen esomeprazole (NEXIUM) 40 MG capsule Take 40 mg by mouth daily before breakfast.      . Insulin Detemir (LEVEMIR FLEXPEN [place]) Inject 15 Units into the skin at bedtime.     . INVOKANA 100 MG TABS Take 100 mg by mouth daily before breakfast.     . isosorbide mononitrate (IMDUR) 120 MG 24 hr tablet Take 1 tablet (120 mg total) by mouth daily. 30 tablet 9  . levothyroxine (SYNTHROID, LEVOTHROID) 200 MCG tablet Take 200 mcg by mouth daily.    . metFORMIN (GLUMETZA) 1000  MG (MOD) 24 hr tablet Take 1 tablet (1,000 mg total) by mouth 2 (two) times daily with a meal. HOLD for 48 hours, restart on 06/01/2014.    . metoprolol succinate (TOPROL-XL) 25 MG 24 hr tablet Take 25 mg by mouth daily.      Marland Kitchen NITROSTAT 0.4 MG SL tablet Place 1 tablet under the tongue every 5 (five) minutes as needed for chest pain.     Marland Kitchen NOVOTWIST 32G X 5 MM MISC Use as directed    . omega-3 acid ethyl esters (LOVAZA) 1 G capsule Take 2 g by mouth 2 (two) times daily.    Marland Kitchen omega-3 acid ethyl esters (LOVAZA) 1 G capsule TAKE 2 CAPSULES (2 G TOTAL) BY MOUTH 2 (TWO) TIMES DAILY. 120 capsule 6  . ONE TOUCH ULTRA TEST test strip Use as directed    . triamterene-hydrochlorothiazide (DYAZIDE) 37.5-25 MG per capsule Take 1 capsule by mouth every morning.      . triamterene-hydrochlorothiazide (MAXZIDE-25) 37.5-25 MG per tablet Take 1 tablet by mouth daily.    . verapamil (CALAN-SR) 240 MG CR tablet Take 1 tablet by mouth daily.     No current facility-administered medications on file prior to visit.

## 2014-12-04 NOTE — Telephone Encounter (Signed)
Coughing worse in past month suggests that pollen is bothering her. I would suggest she try otc Zyrtec, 1 daily And Rx  Gabapentin 100 mg, # 90, 1 three times daily to calm nerves in upper airway.

## 2014-12-08 ENCOUNTER — Ambulatory Visit (INDEPENDENT_AMBULATORY_CARE_PROVIDER_SITE_OTHER): Payer: 59 | Admitting: Internal Medicine

## 2014-12-08 ENCOUNTER — Encounter: Payer: Self-pay | Admitting: Internal Medicine

## 2014-12-08 VITALS — BP 128/70 | HR 97 | Ht 62.0 in | Wt 169.0 lb

## 2014-12-08 DIAGNOSIS — G4733 Obstructive sleep apnea (adult) (pediatric): Secondary | ICD-10-CM

## 2014-12-08 DIAGNOSIS — J302 Other seasonal allergic rhinitis: Secondary | ICD-10-CM

## 2014-12-08 DIAGNOSIS — J309 Allergic rhinitis, unspecified: Secondary | ICD-10-CM

## 2014-12-08 DIAGNOSIS — R05 Cough: Secondary | ICD-10-CM | POA: Diagnosis not present

## 2014-12-08 DIAGNOSIS — J3089 Other allergic rhinitis: Secondary | ICD-10-CM

## 2014-12-08 DIAGNOSIS — J209 Acute bronchitis, unspecified: Secondary | ICD-10-CM | POA: Diagnosis not present

## 2014-12-08 DIAGNOSIS — R059 Cough, unspecified: Secondary | ICD-10-CM

## 2014-12-08 MED ORDER — HYDROCODONE-HOMATROPINE 5-1.5 MG/5ML PO SYRP: 5.0000 mL | ORAL_SOLUTION | Freq: Four times a day (QID) | ORAL | Status: DC | PRN

## 2014-12-08 MED ORDER — MOMETASONE FURO-FORMOTEROL FUM 200-5 MCG/ACT IN AERO: 2.0000 | INHALATION_SPRAY | Freq: Two times a day (BID) | RESPIRATORY_TRACT | Status: DC

## 2014-12-08 MED ORDER — METHYLPREDNISOLONE ACETATE 80 MG/ML IJ SUSP
80.0000 mg | Freq: Once | INTRAMUSCULAR | Status: AC
  Administered 2014-12-08: 80 mg via INTRAMUSCULAR

## 2014-12-08 MED ORDER — PHENYLEPHRINE HCL 1 % NA SOLN
3.0000 [drp] | Freq: Once | NASAL | Status: AC
  Administered 2014-12-08: 3 [drp] via NASAL

## 2014-12-08 NOTE — Progress Notes (Signed)
Patient ID: [name], female    DOB: [birth year], 59 y.o.   MRN: [id number]  HPI 11/08/10- 75 yo F former smoker, followed here for obstructive sleep apnea and for hx bronchitis.  She was recently treated by her PCP for acute bronchitis. Onset after mowing grass. Still coughing up thick greenish yellow mucus after augmentin they gave her. She thinks she does better with doxycycline. Denies fever, swollen glands, sore throat.  Last seen here May 10, 2010. Hx of allergy testing but never on vaccine. Pollen is bothering with nasal congestion and wheezey cough more this year.  Continues CPAP at 13 cwp all night every night.  Sleeps ok. She asks about alternatives and we discussed palatal surgeries, oral appliances and Provent nasal valves. [name] Twin sister had MI.  05/10/11- 28 yo F former smoker, followed here for obstructive sleep apnea and for hx bronchitis, allergic rhinitis, complicated by DM, hypothyroid, CAD  Since last here, Dr. Claiborne Billings placed a coronary stent. 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  CPAP compliance is good all night every night. She isn't sure about mask and we discussed going to [facility] to look at alternatives.  Wants flu vac. She still feels tight in the chest that she continues Advair 250. Some wheeze. Uses rescue inhaler and asks about getting a nebulizer machine. We had called in a Z-Pak which she says was not strong enough. She denies discolored sputum or fever. We discussed using cortisone and anticipated impact on her insulin-dependent diabetes temporarily. She complains of a "raw" substernal sensation but thinks Nexium controls her reflux well.  08/29/11-  30 yo F former smoker, followed here for obstructive sleep apnea and for hx bronchitis, allergic rhinitis, complicated by DM, hypothyroid, CAD  Acute visit-  deep cough-yellow in color and streaks of blood at times; SOB and wheezing-went to ER Saturday([name])Lower  back pain  she did have flu vaccine. Last week had GI upset with vomiting and right upper quadrant pain. She still has her gallbladder. That discomfort has eased off. 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 As her GI upset resolved, she developed chest tightness with cough and cold symptoms, fever and chills 3 days ago now resolved. Head and chest are still congested. [name] his nebulizer treatment x2 started prednisone taper. She  Is finishing doxycycline with 2 days left. CHEST - 2 VIEW 08/27/11-  Comparison: 05/10/2011  Findings: Mild peribronchial thickening. Heart and mediastinal  contours are within normal limits. No focal opacities or  effusions. No acute bony abnormality.  IMPRESSION:  Mild bronchitic changes.  Original Report Authenticated By: [name], M.D.   11/08/11- 55 yo F former smoker, followed here for obstructive sleep apnea and for hx bronchitis, allergic rhinitis, complicated by DM, hypothyroid, CAD  Continues using CPAP 13 almost every night. No sleeping pills. Nebulizer treatment and Depo-Medrol did help her breathing last visit. She would like a home nebulizer which we discussed. Singulair did not help. Blames her cough on postnasal drip or getting too hot. Has history of occasional thrush which might also contribute to throat discomfort.   05/11/2012 Acute OV  Complains of head congestion w/ green drainage, PND, prod cough with gree mucus, hoarseness, increased SOB, wheezing, some tightness x2 weeks - denies f/c/s.  has finished abx and pred thru PCP w/in last 2 weeks Took keflex and steroid taper with minimal  improvement. Has sinus congestion, ear fullness, hoarseness and drainage.  No hemotpysis or chest pain  No edema.  OTC not helping with cough.   06/29/12- 9 yo F former smoker, followed here for obstructive sleep apnea and for hx bronchitis, allergic rhinitis, complicated by DM, hypothyroid,  CAD  FOLLOWS FOR: still having cough and feels fullness in chest coming back since seeign TP.; discomfort on right side lung area CPAP 13 Feeling better after she was seen by the nurse practitioner October 4. Now has caught a new cold. Sneezing. Hard cough caused sharp pain right lateral rib. Area still hurts to cough. Scant phlegm, mild sore throat, no fever but did have some chills originally. Took Augmentin then amoxicillin used for dental prophylaxis. CXR 05/11/12-reviewed IMPRESSION:  No acute cardiopulmonary abnormality.  Original Report Authenticated By: [name], M.D.   11/06/12- 7 yo F former smoker, followed here for obstructive sleep apnea and for hx bronchitis, allergic rhinitis, complicated by DM, hypothyroid, CAD , OSA FOLLOW FOR:c/o sinus congestion,chest tightness x 4 days,mild sob w exertion,,cough and nasal congestion yellow,,right ear stopped up and hurting,Finished mouthwash that was called in 2 wks. Ago Eyes itch, postnasal drip, malaise x 4 days. Amoxacillin 2 weeks ago for ?strep throat. Diazepam for cramps.  CPAP 13/ [facility].   06/18/13- 74 yo F former smoker, followed here for obstructive sleep apnea and for hx bronchitis, allergic rhinitis, complicated by DM, hypothyroid, CAD , OSA FOLLOWS FOR: Breathing is slightly improved. Reports SOB and wheezing. Denies chest tightness today. Is producing green mucus from nose. Denies fever or sore throat. Using Mucinex and saline rinse. Had flu vaccine. CPAP 13/ [facility] is not being used as much as she should. Using Nexium, feels no reflux or heartburn  01/23/14- 40 yo F former smoker, followed here for obstructive sleep apnea and for hx bronchitis, allergic rhinitis, complicated by DM, hypothyroid, CAD , OSA FOLLOWS FOR:  Having increased sob with exertion, wheezing and cough with yellow mucus x1 month.  Pt has done round of anitbiotics and pred shot twice. CPAP/13/[facility] Onset of sinusitis in March with extension to  chest/bronchitis. Urgent care treated with steroid shot, antibiotic shot and then cephalosporin. She started itching attributed to the antibiotic. Chest x-ray was clear. She was given repeat steroid and antibiotic injections. Still blowing green from her nose. Using Sudafed and saline nasal rinse.no fever or headache recognized CXR 09/02/13 IMPRESSION:  No acute disease.  Electronically Signed  By: Inge Rise M.D.  On: 09/02/2013 20:28  08/04/14- 34 yo F former smoker, followed here for obstructive sleep apnea and for hx bronchitis, allergic rhinitis, complicated by DM, hypothyroid, CAD , OSA FOLLOWS FOR: DME [id number] had respiratory infection over Thanksgiving. Dr. Forde Dandy gave abx (Augmentin) last week and she feels better. She got steroid injection by Dr. Amedeo Plenty last week as well. Patient still on abx, she still has sl cough and wheeze at times. She denies sob at this time . Abnl nuclear stress test then cath- CAD/ stents, no progression CPAP 13/ [facility]- all night every night  12/08/14- 89 yo F former smoker, followed here for obstructive sleep apnea and for hx bronchitis, allergic rhinitis, complicated by DM, hypothyroid, CAD , OSA Follows for: prod cough at times w/yellow mucus; hoarseness; feels like back of throat is "gritty"; feels R side stopped up; x 1 mth CPAP 13/ [facility]- all night every night Gabapentin some help with chronic cough at 100 mg 3 times a day. Right ear itches. Cough worse at night,  lying down or if hot or humid.  ROS-see HPI Constitutional:   No-   weight loss, night sweats, fevers, chills, fatigue, lassitude. HEENT:   No-  headaches, difficulty swallowing, tooth/dental problems, sore throat,      No-sneezing, itching, ear ache, +nasal congestion, post nasal drip,  CV:  No- chest pain, no-orthopnea, PND, swelling in lower extremities, anasarca,  dizziness, palpitations Resp: No-   shortness of breath with exertion or at rest.              No-  productive  cough,  + non-productive cough,  No- coughing up of blood.              No-change in color of mucus.  No- wheezing.   Skin: No-   rash or lesions. GI:  No-   heartburn, indigestion, abdominal pain, nausea, vomiting,  GU: . MS:  No-   joint pain or swelling.  . Neuro-     nothing unusual Psych:  No- change in mood or affect. No depression or anxiety.  No memory loss.  Objective:   Physical Exam OBJ- Physical Exam General- Alert, Oriented, Affect-appropriate, Distress- none acute Skin- rash-none, lesions- none, excoriation- none Lymphadenopathy- none Head- atraumatic            Eyes- + periorbital edema, Gross vision intact, PERRLA, conjunctivae and secretions clear            Ears- Hearing, canals-normal,             Nose- No- turb edema,  no-Septal dev, mucus, polyps, erosion, perforation             Throat- Mallampati II , mucosa clear , drainage- none, tonsils- atrophic Neck- flexible , trachea midline, no stridor , thyroid nl, carotid no bruit Chest - symmetrical excursion , unlabored           Heart/CV- RRR , no murmur , no gallop  , no rub, nl s1 s2                           - JVD- none , edema- none, stasis changes- none, varices- none           Lung-  +Coarse breath sounds, but No-wheeze-, no- cough , dullness-none, rub- none           Chest wall-  Abd-  Br/ Gen/ Rectal- Not done, not indicated Extrem- cyanosis- none, clubbing, none, atrophy- none, strength- nl Neuro- grossly intact to observation

## 2014-12-08 NOTE — Patient Instructions (Addendum)
Try increasing gabapentin to 200 mg three times daily  Neb neo nasal   Depo 80  Sample Dulera 200   2 puffs then rinse mouth twice daily.    When you run out of this, go back to Advair for comparison.  See if your cough and the feeling in your throat get any better with [facility].  Script for cough syrup

## 2014-12-12 ENCOUNTER — Telehealth: Payer: Self-pay | Admitting: Internal Medicine

## 2014-12-12 NOTE — Telephone Encounter (Signed)
Pt was inquiring about Dulera samples. Advised her that we do not have any Dulera 200 samples at this time. Nothing further was needed.

## 2014-12-19 ENCOUNTER — Telehealth: Payer: Self-pay | Admitting: Internal Medicine

## 2014-12-19 NOTE — Telephone Encounter (Signed)
Samples have been placed up front for pick up of Dulera 200. Pt is aware. Nothing further was needed.

## 2014-12-19 NOTE — Telephone Encounter (Signed)
Spoke with pt. Advised her that she needs to contact her PCP about this matter. She agreed. Nothing further was needed.

## 2014-12-21 NOTE — Assessment & Plan Note (Signed)
Increased nasal congestion consistent with spring pollen season. Plan-nasal nebulizer decongestant, Depo-Medrol, continue OTC antihistamine and nasal sprays.

## 2014-12-21 NOTE — Assessment & Plan Note (Signed)
Plan-try increasing gabapentin to 200 mg 3 times a day with discussion. Try sample Dulera 200 mg twice daily, refill cough syrup

## 2014-12-21 NOTE — Assessment & Plan Note (Signed)
She describes good compliance and control still with CPAP 13/[facility]. We discussed comfort measures. No changes appropriate at this time.

## 2015-02-02 ENCOUNTER — Other Ambulatory Visit: Payer: Self-pay

## 2015-02-03 ENCOUNTER — Ambulatory Visit: Payer: 59 | Admitting: Internal Medicine

## 2015-02-25 ENCOUNTER — Other Ambulatory Visit (HOSPITAL_BASED_OUTPATIENT_CLINIC_OR_DEPARTMENT_OTHER): Payer: Self-pay | Admitting: Endocrinology

## 2015-02-25 DIAGNOSIS — Z1231 Encounter for screening mammogram for malignant neoplasm of breast: Secondary | ICD-10-CM

## 2015-02-27 ENCOUNTER — Ambulatory Visit (HOSPITAL_BASED_OUTPATIENT_CLINIC_OR_DEPARTMENT_OTHER)
Admission: RE | Admit: 2015-02-27 | Discharge: 2015-02-27 | Disposition: A | Discharge status: Home / Self Care | Payer: 59 | Source: Ambulatory Visit | Attending: Endocrinology | Admitting: Endocrinology

## 2015-02-27 DIAGNOSIS — Z1231 Encounter for screening mammogram for malignant neoplasm of breast: Secondary | ICD-10-CM | POA: Diagnosis present

## 2015-03-02 ENCOUNTER — Encounter: Payer: Self-pay | Admitting: Physical Therapy

## 2015-03-02 ENCOUNTER — Ambulatory Visit: Payer: 59 | Attending: Orthopedic Surgery | Admitting: Physical Therapy

## 2015-03-02 DIAGNOSIS — R29898 Other symptoms and signs involving the musculoskeletal system: Secondary | ICD-10-CM | POA: Insufficient documentation

## 2015-03-02 DIAGNOSIS — M25512 Pain in left shoulder: Secondary | ICD-10-CM

## 2015-03-02 DIAGNOSIS — M542 Cervicalgia: Secondary | ICD-10-CM | POA: Diagnosis present

## 2015-03-02 NOTE — Therapy (Signed)
[facility] [address]  [place], [name], [address] Phone: [phone]   Fax:  [phone]  Physical Therapy Evaluation  Patient Details  [name] MRN: [id number] Date of Birth: 07/05/1956 Referring Provider:  Roseanne Kaufman, MD  Encounter Date: 03/02/2015      PT End of Session - 03/02/15 0909    Visit Number 1   Number of Visits 12   Date for PT Re-Evaluation 03/30/15   PT Start Time [id number]   PT Stop Time 0939   PT Time Calculation (min) 52 min      Past Medical History  Diagnosis Date  . Unspecified hypothyroidism   . Cough   . Acute bronchitis   . Obstructive sleep apnea (adult) (pediatric)   . Coronary atherosclerosis   . Anginal pain   . Hypertension   . Type II or unspecified type diabetes mellitus without mention of complication, not stated as uncontrolled     type 2  . Arthritis     osteo  . Anemia     hx of anemia    Past Surgical History  Procedure Laterality Date  . Tubal ligation    . Anterior fusion cervical spine    . Wisdom tooth extraction    . Left heart catheterization with coronary angiogram N/A 05/29/2014    Procedure: LEFT HEART CATHETERIZATION WITH CORONARY ANGIOGRAM;  Surgeon: Sinclair Grooms, MD;  Location: [facility] CATH LAB;  Service: Cardiovascular;  Laterality: N/A;    There were no vitals filed for this visit.  Visit Diagnosis:  Left shoulder pain - Plan: PT plan of care cert/re-cert  Shoulder weakness - Plan: PT plan of care cert/re-cert  Neck pain - Plan: PT plan of care cert/re-cert      Subjective Assessment - 03/02/15 0853    Subjective pt with c/o L upper back and scapular pain for the past several weeks for unknown reason.  She states she has history of L shoulder pain and states was told is due to OA.  She states she has received 2 injections to L shoulder with good benefit.  Most recent injection was January 2016.  Also states has R RC tear  but unwilling to undergo surgery due to too busy.  Notes N/T into L finger tips at night for past  couple years.   Pertinent History c-spine fusion C4-C7 in 2008 or 2009   Diagnostic tests MRI from 2012 notes facet and disc issues above and  below c-spine fusion   Patient Stated Goals hoping to avoid additional injections into L shoulder   Currently in Pain? Yes   Pain Score 5   5/10 on average, 10/10 at worst   Pain Location Scapula   Pain Orientation Left;Posterior   Pain Descriptors / Indicators Aching;Throbbing   Pain Radiating Towards pain throughout L scapula and extends into L UE at times   Pain Frequency Constant   Aggravating Factors  over use, reaching, lifting/carrying with L UE   Pain Relieving Factors medication            [facility] PT Assessment - 03/02/15 0001    Assessment   Medical Diagnosis neck and shoulder pain   Onset Date/Surgical Date 08/08/06   Hand Dominance Right   Balance Screen   Has the patient fallen in the past 6 months No   Has the patient had a decrease in activity level because of a fear of falling?  No  Is the patient reluctant to leave their home because of a fear of falling?  No   Home Environment   Living Environment Private residence   Living Arrangements Other relatives   Type of Home House   Prior Function   Vocation Full time employment   Vocation Requirements school bus driver   Leisure enjoys yardwork, enjoys walking   Observation/Other Assessments   Focus on Therapeutic Outcomes (FOTO)  48% limitation   Posture/Postural Control   Posture Comments mild forward flexed posture (flexed at hips), rounded shoulders and forward head   ROM / Strength   AROM / PROM / Strength AROM;Strength   AROM   Overall AROM Comments pt c/o B Shoulder pain with AROM   AROM Assessment Site Cervical   Right/Left Shoulder Left;Right   Right Shoulder Flexion 125 Degrees   Right Shoulder ABduction 95 Degrees   Right Shoulder Internal Rotation --  reach  to T9   Right Shoulder External Rotation --  reach to T4   Left Shoulder Flexion 125 Degrees   Left Shoulder ABduction 100 Degrees   Left Shoulder Internal Rotation --  reach to T8   Left Shoulder External Rotation --  reach to T5   Cervical Extension 46  no pain   Cervical - Right Side Bend 18   Cervical - Left Side Bend 12  L lower neck / upper scapular pain   Cervical - Right Rotation 55   Cervical - Left Rotation 55  notes mild L upper scapular pain   Strength   Overall Strength Comments L Shoulder limited by pain   Strength Assessment Site Shoulder   Right/Left Shoulder Left   Left Shoulder Flexion 4-/5   Left Shoulder Extension 4+/5   Left Shoulder ABduction 4-/5   Left Shoulder Internal Rotation 4/5   Left Shoulder External Rotation 4-/5   Special Tests    Special Tests Rotator Cuff Impingement   Rotator Cuff Impingment tests HawkinsMerrilyn Puma test;Belly Press;Empty Can test;Full Can test   Hawkins-Kennedy test   Findings Positive   Side Left   Belly Press   Findings Negative   Side Left   Empty Can test   Findings Positive   Side Left   Full Can test   Findings Positive   Side Left           TODAY'S TREATMENT Manual - TPR L upper trap in R side-lying followed by gentle L scapular mobes.  Pt states felt improvement in pain and mobility following treatment.                     PT Long Term Goals - 03/02/15 1158    PT LONG TERM GOAL #1   Title pt reports able to perform ADLs, chores, and recreational activities without limitation by L shoulder or neck pain by 03/30/15   Status New   PT LONG TERM GOAL #2   Title L Shoulder AROM WFL and MMT 4+/5 or better grossly by 03/30/15   Status New               Plan - 03/02/15 1201    Clinical Impression Statement pt with c/o L shoulder pain and L neck pain.  Pains are chronic in nature but she states pain has worsened lately.  PMHx includes c-spine fusion C4-7 in 2008 and she states she  was told she has OA in L shoulder and has received at least 2 injections to the shoulder.  She wants  to avoid further injections.  Assessment today indicates L shoulder impingment with likley supraspinatus injury.  Pt with large trigger point in L upper trapezius which could be related to neck or RC issues but pt noted significant benefit with several minutes of TPR today.  Neck assessment reveals pretty good ROM for C4-7 fusion but she notes L neck pain with L rotation and L side-bending which she states feels like a crick.  Pt's posture includes forward head and rounded shoulders which likely contributes to her symptoms.  L shoulder MMT limited to 4-/5 to 4+/5 due to pain and AROM limited into flexion and ABD due to pain.    Pt will benefit from skilled therapeutic intervention in order to improve on the following deficits Pain;Decreased strength;Postural dysfunction;Improper body mechanics;Decreased range of motion   Rehab Potential Good   PT Frequency 3x / week   PT Duration 4 weeks   PT Treatment/Interventions Therapeutic exercise;Therapeutic activities;Manual techniques;Taping;Dry needling;Patient/family education;Neuromuscular re-education;Electrical Stimulation;Vasopneumatic Device   PT Next Visit Plan manual TPR and trial dry needling when seen by PT; scapular retraction and RC strengthening as able without increased pain.  Foam roll exercises as able.   Consulted and Agree with Plan of Care Patient          G-Codes - March 20, 2015 0910    Functional Assessment Tool Used foto 48% limitation   Functional Limitation Changing and maintaining body position   Changing and Maintaining Body Position Current Status [phone]) At least 40 percent but less than 60 percent impaired, limited or restricted   Changing and Maintaining Body Position Goal Status [id number]) At least 20 percent but less than 40 percent impaired, limited or restricted       Problem List Patient Active Problem List   Diagnosis Date  Noted  . Acute bronchitis 08/10/2014  . Unstable angina 05/29/2014  . Abnormal nuclear stress test 05/29/2014  . Chest pain 05/06/2014  . Exertional shortness of breath 05/06/2014  . Mixed hyperlipidemia 05/06/2014  . Hyperlipidemia with target LDL less than 70 08/05/2013  . HTN (hypertension) 08/05/2013  . Chest wall pain 08/05/2013  . SINUSITIS, ACUTE 04/07/2009  . HYPOTHYROIDISM 12/10/2007  . DIABETES MELLITUS, TYPE II 05/28/2007  . Obstructive sleep apnea 05/28/2007  . Coronary atherosclerosis 05/28/2007  . Seasonal and perennial allergic rhinitis 05/28/2007  . COUGH, CHRONIC 05/28/2007    Jeffrie Stander PT, OCS 03/20/15, 12:18 PM  [facility] [address]  [place], [name], [address] Phone: [phone]   Fax:  [phone]

## 2015-03-03 ENCOUNTER — Ambulatory Visit: Payer: 59 | Admitting: Rehabilitation

## 2015-03-03 DIAGNOSIS — R29898 Other symptoms and signs involving the musculoskeletal system: Secondary | ICD-10-CM

## 2015-03-03 DIAGNOSIS — M25512 Pain in left shoulder: Secondary | ICD-10-CM

## 2015-03-03 DIAGNOSIS — M542 Cervicalgia: Secondary | ICD-10-CM

## 2015-03-03 NOTE — Therapy (Signed)
[facility] [address]  [place], [name], [address] Phone: [phone]   Fax:  [phone]  Physical Therapy Treatment  Patient Details  [name] MRN: [id number] Date of Birth: 11/25/1955 Referring Provider:  Roseanne Kaufman, MD  Encounter Date: 03/03/2015      PT End of Session - 03/03/15 1016    Visit Number 2   Number of Visits 12   Date for PT Re-Evaluation 03/30/15   PT Start Time 1016   PT Stop Time 1055   PT Time Calculation (min) 39 min      Past Medical History  Diagnosis Date  . Unspecified hypothyroidism   . Cough   . Acute bronchitis   . Obstructive sleep apnea (adult) (pediatric)   . Coronary atherosclerosis   . Anginal pain   . Hypertension   . Type II or unspecified type diabetes mellitus without mention of complication, not stated as uncontrolled     type 2  . Arthritis     osteo  . Anemia     hx of anemia    Past Surgical History  Procedure Laterality Date  . Tubal ligation    . Anterior fusion cervical spine    . Wisdom tooth extraction    . Left heart catheterization with coronary angiogram N/A 05/29/2014    Procedure: LEFT HEART CATHETERIZATION WITH CORONARY ANGIOGRAM;  Surgeon: Sinclair Grooms, MD;  Location: [facility] CATH LAB;  Service: Cardiovascular;  Laterality: N/A;    There were no vitals filed for this visit.  Visit Diagnosis:  Left shoulder pain  Shoulder weakness  Neck pain      Subjective Assessment - 03/03/15 1018    Subjective Reports feeling great after yesterday and didn't have to take pain medication last night.    Currently in Pain? Yes   Pain Score 2    Pain Location Scapula   Pain Orientation Left;Proximal      TODAY'S TREATMENT TherEx - UBE level 1.0 90"/90" Warehouse manager 3x20" Corner Retraction 10x3" Low Row Black TB 10x  Manual - TPR L upper trap in R side-lying followed by gentle L scapular mobes STM to Lt upper trap in  supine to pt tolerance with and without upper trap stretch Manual upper trap stretch then levator stretch  TherEx - Hooklying on 1/2 FR: Pec stretch x60", Bil pullover 3# 10x (instructed pt to only move in comfortable ROM)           PT Long Term Goals - 03/03/15 1017    PT LONG TERM GOAL #1   Title pt reports able to perform ADLs, chores, and recreational activities without limitation by L shoulder or neck pain by 03/30/15   Status On-going   PT LONG TERM GOAL #2   Title L Shoulder AROM WFL and MMT 4+/5 or better grossly by 03/30/15   Status On-going               Plan - 03/03/15 1056    Clinical Impression Statement Good tolerance to manual as well as exercises today. No pain, just difficulty with foam roll exercises and maintaing position (pullover). Gave initial HEP today including corner stretch, low row and upper trap stretch.    PT Next Visit Plan manual TPR and trial dry needling when seen by PT; scapular retraction and RC strengthening as able without increased pain.  Foam roll exercises as able.   Consulted and Agree with Plan of Care  Patient          G-Codes - 03/02/15 [id number]    Functional Assessment Tool Used foto 48% limitation   Functional Limitation Changing and maintaining body position   Changing and Maintaining Body Position Current Status [phone]) At least 40 percent but less than 60 percent impaired, limited or restricted   Changing and Maintaining Body Position Goal Status [id number]) At least 20 percent but less than 40 percent impaired, limited or restricted      Problem List Patient Active Problem List   Diagnosis Date Noted  . Acute bronchitis 08/10/2014  . Unstable angina 05/29/2014  . Abnormal nuclear stress test 05/29/2014  . Chest pain 05/06/2014  . Exertional shortness of breath 05/06/2014  . Mixed hyperlipidemia 05/06/2014  . Hyperlipidemia with target LDL less than 70 08/05/2013  . HTN (hypertension) 08/05/2013  . Chest wall pain  08/05/2013  . SINUSITIS, ACUTE 04/07/2009  . HYPOTHYROIDISM 12/10/2007  . DIABETES MELLITUS, TYPE II 05/28/2007  . Obstructive sleep apnea 05/28/2007  . Coronary atherosclerosis 05/28/2007  . Seasonal and perennial allergic rhinitis 05/28/2007  . COUGH, CHRONIC 05/28/2007    Barbette Hair, PTA 03/03/2015, 10:58 AM  [facility] [address]  [place], [name], [address] Phone: [phone]   Fax:  [phone]

## 2015-03-04 ENCOUNTER — Ambulatory Visit: Payer: 59 | Admitting: Physical Therapy

## 2015-03-04 ENCOUNTER — Other Ambulatory Visit: Payer: Self-pay | Admitting: Gynecology

## 2015-03-04 DIAGNOSIS — M25512 Pain in left shoulder: Secondary | ICD-10-CM

## 2015-03-04 DIAGNOSIS — M542 Cervicalgia: Secondary | ICD-10-CM

## 2015-03-04 DIAGNOSIS — R29898 Other symptoms and signs involving the musculoskeletal system: Secondary | ICD-10-CM

## 2015-03-04 NOTE — Therapy (Signed)
[facility] [address]  [place], [name], [address] Phone: [phone]   Fax:  [phone]  Physical Therapy Treatment  Patient Details  [name] MRN: [id number] Date of Birth: 25-May-1956 Referring Provider:  Roseanne Kaufman, MD  Encounter Date: 03/04/2015      PT End of Session - 03/04/15 0856    Visit Number 3   Number of Visits 12   Date for PT Re-Evaluation 03/30/15   PT Start Time 0853   PT Stop Time 0942   PT Time Calculation (min) 49 min      Past Medical History  Diagnosis Date  . Unspecified hypothyroidism   . Cough   . Acute bronchitis   . Obstructive sleep apnea (adult) (pediatric)   . Coronary atherosclerosis   . Anginal pain   . Hypertension   . Type II or unspecified type diabetes mellitus without mention of complication, not stated as uncontrolled     type 2  . Arthritis     osteo  . Anemia     hx of anemia    Past Surgical History  Procedure Laterality Date  . Tubal ligation    . Anterior fusion cervical spine    . Wisdom tooth extraction    . Left heart catheterization with coronary angiogram N/A 05/29/2014    Procedure: LEFT HEART CATHETERIZATION WITH CORONARY ANGIOGRAM;  Surgeon: Sinclair Grooms, MD;  Location: [facility] CATH LAB;  Service: Cardiovascular;  Laterality: N/A;    There were no vitals filed for this visit.  Visit Diagnosis:  Left shoulder pain  Shoulder weakness  Neck pain      Subjective Assessment - 03/04/15 0855    Subjective states has noted relief since initial treatment stating has not required pain medication for past 2 days and is sleeping okay.   Currently in Pain? Yes   Pain Score --  "no where near as bad, like a tingle in L upper scapula"           TODAY'S TREATMENT Manual - R Side-lying L scapular mobes and stretching to L mid trap Prone, TPR throughout L mid and upper trapezius (able to produce twitch response during TPR  to mid trapezius) Supine, L 1st rib mobes grade 2 and c-spine B SB mobes/UT stretching  Kinesiology taping: 5 strips: "X" 50% B lateral delt along mid traps to spine, 1 strip each to anterior and posterior L delt at 30%, 1 strip lateral delt and upper trap at 50%.                           PT Long Term Goals - 03/03/15 1017    PT LONG TERM GOAL #1   Title pt reports able to perform ADLs, chores, and recreational activities without limitation by L shoulder or neck pain by 03/30/15   Status On-going   PT LONG TERM GOAL #2   Title L Shoulder AROM WFL and MMT 4+/5 or better grossly by 03/30/15   Status On-going               Plan - 03/04/15 0943    Clinical Impression Statement pt is making excellent progress with current POC focusing on TPR throughout L upper and mid trapezius.  No needling today due to good progress to date. Will assess L GH and c-spine more thoroughly as her pain improves to see if underlying inflammatory issue is  causing the trigger points throughout L scapula.   PT Next Visit Plan manual TPR and trial dry needling PRN when seen by PT; scapular retraction and RC strengthening as able without increased pain.  Foam roll exercises as able.   Consulted and Agree with Plan of Care Patient        Problem List Patient Active Problem List   Diagnosis Date Noted  . Acute bronchitis 08/10/2014  . Unstable angina 05/29/2014  . Abnormal nuclear stress test 05/29/2014  . Chest pain 05/06/2014  . Exertional shortness of breath 05/06/2014  . Mixed hyperlipidemia 05/06/2014  . Hyperlipidemia with target LDL less than 70 08/05/2013  . HTN (hypertension) 08/05/2013  . Chest wall pain 08/05/2013  . SINUSITIS, ACUTE 04/07/2009  . HYPOTHYROIDISM 12/10/2007  . DIABETES MELLITUS, TYPE II 05/28/2007  . Obstructive sleep apnea 05/28/2007  . Coronary atherosclerosis 05/28/2007  . Seasonal and perennial allergic rhinitis 05/28/2007  . COUGH, CHRONIC  05/28/2007    Deanne Bedgood PT, OCS 03/04/2015, 9:46 AM  [facility] [address] [place], [name], [address] Phone: [phone]   Fax:  [phone]

## 2015-03-05 LAB — CYTOLOGY - PAP

## 2015-03-09 ENCOUNTER — Ambulatory Visit: Payer: 59 | Admitting: Physical Therapy

## 2015-03-11 ENCOUNTER — Ambulatory Visit: Payer: 59 | Attending: Orthopedic Surgery | Admitting: Rehabilitation

## 2015-03-11 DIAGNOSIS — M25512 Pain in left shoulder: Secondary | ICD-10-CM | POA: Insufficient documentation

## 2015-03-11 DIAGNOSIS — M542 Cervicalgia: Secondary | ICD-10-CM | POA: Insufficient documentation

## 2015-03-11 DIAGNOSIS — R29898 Other symptoms and signs involving the musculoskeletal system: Secondary | ICD-10-CM | POA: Diagnosis present

## 2015-03-11 NOTE — Therapy (Signed)
[facility] [address]  [place], [name], [address] Phone: [phone]   Fax:  [phone]  Physical Therapy Treatment  Patient Details  [name] MRN: [id number] Date of Birth: 1956/01/06 Referring Provider:  Roseanne Kaufman, MD  Encounter Date: 03/11/2015      PT End of Session - 03/11/15 0845    Visit Number 4   Number of Visits 12   Date for PT Re-Evaluation 03/30/15   PT Start Time 0845   PT Stop Time 0928   PT Time Calculation (min) 43 min      Past Medical History  Diagnosis Date  . Unspecified hypothyroidism   . Cough   . Acute bronchitis   . Obstructive sleep apnea (adult) (pediatric)   . Coronary atherosclerosis   . Anginal pain   . Hypertension   . Type II or unspecified type diabetes mellitus without mention of complication, not stated as uncontrolled     type 2  . Arthritis     osteo  . Anemia     hx of anemia    Past Surgical History  Procedure Laterality Date  . Tubal ligation    . Anterior fusion cervical spine    . Wisdom tooth extraction    . Left heart catheterization with coronary angiogram N/A 05/29/2014    Procedure: LEFT HEART CATHETERIZATION WITH CORONARY ANGIOGRAM;  Surgeon: Sinclair Grooms, MD;  Location: [facility] CATH LAB;  Service: Cardiovascular;  Laterality: N/A;    There were no vitals filed for this visit.  Visit Diagnosis:  Left shoulder pain  Shoulder weakness  Neck pain      Subjective Assessment - 03/11/15 0847    Subjective Reports no pain today and pain has been much better since starting therapy. Also states the tape seems to help.    Currently in Pain? No/denies       TODAY'S TREATMENT TherEx - UBE level 1.0 90"/90" Hooklying on 1/2 FR: Pec stretch x60", Bil pullover 3# 10x, Bil Horiz Abd with red TB 10x, Bil ER with red TB 10x  Manual - Rt Side-lying Lt scapular mobes and stretching to Lt mid trap, STM to upper trap,  Rhomboids STM/TPR to Lt upper trap in supine to pt tolerance with and without upper trap stretch Manual upper trap stretch then levator stretch  Kinesiology taping: 5 strips: "X" 50% B lateral delt along mid traps to spine, 1 strip each to anterior and posterior L delt at 30%, 1 strip lateral delt and upper trap at 50%.       PT Long Term Goals - 03/03/15 1017    PT LONG TERM GOAL #1   Title pt reports able to perform ADLs, chores, and recreational activities without limitation by L shoulder or neck pain by 03/30/15   Status On-going   PT LONG TERM GOAL #2   Title L Shoulder AROM WFL and MMT 4+/5 or better grossly by 03/30/15   Status On-going               Plan - 03/11/15 0928    Clinical Impression Statement Good tolerance with increase foam roll exercises and manual work today. Still note TPR throughout Lt upper/mid trap, rhomboids and will continue to focus on these areas. Continued with taping as well since pt reported good relief.    PT Next Visit Plan manual TPR and trial dry needling PRN when seen by PT; scapular retraction and RC strengthening  as able without increased pain. Foam roll exercises as able. Continue kinesiotape   Consulted and Agree with Plan of Care Patient        Problem List Patient Active Problem List   Diagnosis Date Noted  . Acute bronchitis 08/10/2014  . Unstable angina 05/29/2014  . Abnormal nuclear stress test 05/29/2014  . Chest pain 05/06/2014  . Exertional shortness of breath 05/06/2014  . Mixed hyperlipidemia 05/06/2014  . Hyperlipidemia with target LDL less than 70 08/05/2013  . HTN (hypertension) 08/05/2013  . Chest wall pain 08/05/2013  . SINUSITIS, ACUTE 04/07/2009  . HYPOTHYROIDISM 12/10/2007  . DIABETES MELLITUS, TYPE II 05/28/2007  . Obstructive sleep apnea 05/28/2007  . Coronary atherosclerosis 05/28/2007  . Seasonal and perennial allergic rhinitis 05/28/2007  . COUGH, CHRONIC 05/28/2007    Barbette Hair,  PTA 03/11/2015, 9:31 AM  [facility] [address]  [place], [name], [address] Phone: [phone]   Fax:  [phone]

## 2015-03-12 ENCOUNTER — Ambulatory Visit: Payer: 59 | Admitting: Physical Therapy

## 2015-03-12 DIAGNOSIS — M25512 Pain in left shoulder: Secondary | ICD-10-CM | POA: Diagnosis not present

## 2015-03-12 DIAGNOSIS — M542 Cervicalgia: Secondary | ICD-10-CM

## 2015-03-12 DIAGNOSIS — R29898 Other symptoms and signs involving the musculoskeletal system: Secondary | ICD-10-CM

## 2015-03-12 NOTE — Therapy (Signed)
[facility] [address]  [place], [name], [address] Phone: [phone]   Fax:  [phone]  Physical Therapy Treatment  Patient Details  [name] MRN: [id number] Date of Birth: [birth year] Referring Provider:  Roseanne Kaufman, MD  Encounter Date: 03/12/2015      PT End of Session - 03/12/15 0851    Visit Number 5   Number of Visits 12   Date for PT Re-Evaluation 03/30/15   PT Start Time 0851   PT Stop Time 0929   PT Time Calculation (min) 38 min      Past Medical History  Diagnosis Date  . Unspecified hypothyroidism   . Cough   . Acute bronchitis   . Obstructive sleep apnea (adult) (pediatric)   . Coronary atherosclerosis   . Anginal pain   . Hypertension   . Type II or unspecified type diabetes mellitus without mention of complication, not stated as uncontrolled     type 2  . Arthritis     osteo  . Anemia     hx of anemia    Past Surgical History  Procedure Laterality Date  . Tubal ligation    . Anterior fusion cervical spine    . Wisdom tooth extraction    . Left heart catheterization with coronary angiogram N/A 05/29/2014    Procedure: LEFT HEART CATHETERIZATION WITH CORONARY ANGIOGRAM;  Surgeon: Sinclair Grooms, MD;  Location: [facility] CATH LAB;  Service: Cardiovascular;  Laterality: N/A;    There were no vitals filed for this visit.  Visit Diagnosis:  Left shoulder pain  Shoulder weakness  Neck pain      Subjective Assessment - 03/12/15 0852    Subjective states is pain-free and has not required pain meds lately.   Currently in Pain? No/denies          TODAY'S TREATMENT TherEx -  Hooklying on 1/2 FR: Pec / T-spine Extension stretch x2', B ER Red TB 15x, B Horiz ABD Red TB 15x, UE Flex with contra Ext Red TB 10x each Low Row 25# 15x Standing ER Red TB 15x each (added to HEP)  Manual - c-spine B Rot and B SB mobes grade 2 and some 3; L first rib mobes grade 2;  c-spine retraction and extension grade 2 to 3.                      PT Education - 03/12/15 0909    Education provided Yes   Education Details addition to HEP   Person(s) Educated Patient   Methods Explanation;Demonstration;Handout   Comprehension Verbalized understanding;Returned demonstration             PT Long Term Goals - 03/03/15 1017    PT LONG TERM GOAL #1   Title pt reports able to perform ADLs, chores, and recreational activities without limitation by L shoulder or neck pain by 03/30/15   Status On-going   PT LONG TERM GOAL #2   Title L Shoulder AROM WFL and MMT 4+/5 or better grossly by 03/30/15   Status On-going               Plan - 03/12/15 0935    Clinical Impression Statement excellent pain reduction now focusing on scapular strength/stability and improving c-spine mobility.   PT Next Visit Plan manual TPR and trial dry needling PRN when seen by PT; scapular retraction and RC strengthening as able without increased pain. Foam  roll exercises as able. Continue kinesiotape   Consulted and Agree with Plan of Care Patient        Problem List Patient Active Problem List   Diagnosis Date Noted  . Acute bronchitis 08/10/2014  . Unstable angina 05/29/2014  . Abnormal nuclear stress test 05/29/2014  . Chest pain 05/06/2014  . Exertional shortness of breath 05/06/2014  . Mixed hyperlipidemia 05/06/2014  . Hyperlipidemia with target LDL less than 70 08/05/2013  . HTN (hypertension) 08/05/2013  . Chest wall pain 08/05/2013  . SINUSITIS, ACUTE 04/07/2009  . HYPOTHYROIDISM 12/10/2007  . DIABETES MELLITUS, TYPE II 05/28/2007  . Obstructive sleep apnea 05/28/2007  . Coronary atherosclerosis 05/28/2007  . Seasonal and perennial allergic rhinitis 05/28/2007  . COUGH, CHRONIC 05/28/2007    Elisavet Buehrer PT, OCS 03/12/2015, 10:34 AM  [facility] [address]  [place], [name],  [address] Phone: [phone]   Fax:  [phone]

## 2015-03-16 ENCOUNTER — Ambulatory Visit: Payer: 59 | Admitting: Rehabilitation

## 2015-03-16 DIAGNOSIS — M25512 Pain in left shoulder: Secondary | ICD-10-CM | POA: Diagnosis not present

## 2015-03-16 DIAGNOSIS — M542 Cervicalgia: Secondary | ICD-10-CM

## 2015-03-16 DIAGNOSIS — R29898 Other symptoms and signs involving the musculoskeletal system: Secondary | ICD-10-CM

## 2015-03-16 NOTE — Therapy (Signed)
[name], [name], [address] Phone: [phone]   Fax:  [phone]  Physical Therapy Treatment  Patient Details  [name] MRN: [id number] Date of Birth: 12-19-1955 Referring Provider:  Roseanne Kaufman, MD  Encounter Date: 03/16/2015      PT End of Session - 03/16/15 0851    Visit Number 6   Number of Visits 12   Date for PT Re-Evaluation 03/30/15   PT Start Time 0845   PT Stop Time 0930   PT Time Calculation (min) 45 min   Activity Tolerance Patient tolerated treatment well      Past Medical History  Diagnosis Date  . Unspecified hypothyroidism   . Cough   . Acute bronchitis   . Obstructive sleep apnea (adult) (pediatric)   . Coronary atherosclerosis   . Anginal pain   . Hypertension   . Type II or unspecified type diabetes mellitus without mention of complication, not stated as uncontrolled     type 2  . Arthritis     osteo  . Anemia     hx of anemia    Past Surgical History  Procedure Laterality Date  . Tubal ligation    . Anterior fusion cervical spine    . Wisdom tooth extraction    . Left heart catheterization with coronary angiogram N/A 05/29/2014    Procedure: LEFT HEART CATHETERIZATION WITH CORONARY ANGIOGRAM;  Surgeon: Sinclair Grooms, MD;  Location: [facility] CATH LAB;  Service: Cardiovascular;  Laterality: N/A;    There were no vitals filed for this visit.  Visit Diagnosis:  Left shoulder pain  Shoulder weakness  Neck pain      Subjective Assessment - 03/16/15 0846    Subjective feeling much better.  no pain today   Currently in Pain? No/denies      TODAY'S TREATMENT TherEx -  Hooklying on 1/2 FR: Pec / T-spine Extension stretch x2' bent and straight elbows, B ER Red TB 20x,          B Horiz ABD Red TB 20x, UE Flex with contra Ext Red TB 10x each, cervical retraction 10"x10 Low Row 25# 15x Standing ER Red TB 15x each   Manual - R  sidelying STM L UT and parascapular muscles with scapular PROM incorporated.  c-spine B Rot and B SB mobes grade 2 and some 3; L first rib mobes grade 2;  KT into scapular retraction bilaterally and deltoid Y over RC anterior and posterior shoulder 75% pull all strips                                PT Long Term Goals - 03/03/15 1017    PT LONG TERM GOAL #1   Title pt reports able to perform ADLs, chores, and recreational activities without limitation by L shoulder or neck pain by 03/30/15   Status On-going   PT LONG TERM GOAL #2   Title L Shoulder AROM WFL and MMT 4+/5 or better grossly by 03/30/15   Status On-going               Plan - 03/16/15 1011    Clinical Impression Statement Pt tolerated treatment well. does still have some tenderness in the L Upper trapezius and rhomboids but minimal.  tolerates TE well   PT Next Visit Plan manual TPR and trial dry needling PRN when  seen by PT; scapular retraction and RC strengthening as able without increased pain. Foam roll exercises as able. Continue kinesiotape        Problem List Patient Active Problem List   Diagnosis Date Noted  . Acute bronchitis 08/10/2014  . Unstable angina 05/29/2014  . Abnormal nuclear stress test 05/29/2014  . Chest pain 05/06/2014  . Exertional shortness of breath 05/06/2014  . Mixed hyperlipidemia 05/06/2014  . Hyperlipidemia with target LDL less than 70 08/05/2013  . HTN (hypertension) 08/05/2013  . Chest wall pain 08/05/2013  . SINUSITIS, ACUTE 04/07/2009  . HYPOTHYROIDISM 12/10/2007  . DIABETES MELLITUS, TYPE II 05/28/2007  . Obstructive sleep apnea 05/28/2007  . Coronary atherosclerosis 05/28/2007  . Seasonal and perennial allergic rhinitis 05/28/2007  . COUGH, CHRONIC 05/28/2007    Stark Bray 03/16/2015, 10:13 AM  [facility] [address]  [place], [name], [address] Phone: [phone]   Fax:   [phone]

## 2015-03-18 ENCOUNTER — Ambulatory Visit: Payer: 59 | Admitting: Physical Therapy

## 2015-03-18 DIAGNOSIS — M25512 Pain in left shoulder: Secondary | ICD-10-CM | POA: Diagnosis not present

## 2015-03-18 DIAGNOSIS — R29898 Other symptoms and signs involving the musculoskeletal system: Secondary | ICD-10-CM

## 2015-03-18 DIAGNOSIS — M542 Cervicalgia: Secondary | ICD-10-CM

## 2015-03-18 NOTE — Therapy (Signed)
[facility] [address]  [place], [name], [address] Phone: [phone]   Fax:  [phone]  Physical Therapy Treatment  Patient Details  [name] MRN: [id number] Date of Birth: Mar 14, 1956 Referring Provider:  Roseanne Kaufman, MD  Encounter Date: 03/18/2015      PT End of Session - 03/18/15 1128    Visit Number 7   Number of Visits 12   Date for PT Re-Evaluation 03/30/15   PT Start Time 1127   PT Stop Time 1218   PT Time Calculation (min) 51 min      Past Medical History  Diagnosis Date  . Unspecified hypothyroidism   . Cough   . Acute bronchitis   . Obstructive sleep apnea (adult) (pediatric)   . Coronary atherosclerosis   . Anginal pain   . Hypertension   . Type II or unspecified type diabetes mellitus without mention of complication, not stated as uncontrolled     type 2  . Arthritis     osteo  . Anemia     hx of anemia    Past Surgical History  Procedure Laterality Date  . Tubal ligation    . Anterior fusion cervical spine    . Wisdom tooth extraction    . Left heart catheterization with coronary angiogram N/A 05/29/2014    Procedure: LEFT HEART CATHETERIZATION WITH CORONARY ANGIOGRAM;  Surgeon: Sinclair Grooms, MD;  Location: [facility] CATH LAB;  Service: Cardiovascular;  Laterality: N/A;    There were no vitals filed for this visit.  Visit Diagnosis:  Left shoulder pain  Neck pain  Shoulder weakness      Subjective Assessment - 03/18/15 1128    Subjective states continues to feel good, has not taken pain meds.  Noted some ache in upper L scapula 4/10 last night believing due to weather.  Slept fine and today is pain-free.   Currently in Pain? No/denies            [facility] PT Assessment - 03/18/15 0001    AROM   Cervical Extension 42 / 53 after manual  L upper scap pain extending down L UE   Cervical - Right Side Bend 22 / 28 after manual   Cervical - Left Side Bend  18 / 22 after manual  L upper scap pain   Cervical - Right Rotation 45 / 55 after manual   Cervical - Left Rotation 57 / 52 after manual  L upper scap pain          TODAY'S TREATMENT C-Spine AROM assessment Manual - C-spine Grade 3 mobes into Retraction, B Rot, and B SB mobes with emphasis to lower c-spine, L first rib mobes grade 2 and TPR to L UT and LS.   Mechanical Traction - c-spine, 20dg pull, 10#/5#, 60"/20", 12'                        PT Long Term Goals - 03/03/15 1017    PT LONG TERM GOAL #1   Title pt reports able to perform ADLs, chores, and recreational activities without limitation by L shoulder or neck pain by 03/30/15   Status On-going   PT LONG TERM GOAL #2   Title L Shoulder AROM WFL and MMT 4+/5 or better grossly by 03/30/15   Status On-going               Plan - 03/18/15 1221  Clinical Impression Statement pt with improved L shoulder pain but notes L UE pain with c-spine AROM at start of today's treatment.  Treatment therefore focused on c-spine mobility followed by mechanical traction to decrease apparent neural impingement.  Pt reports feeling much better following treatment and displayed improved c-spine AROM following manual.  Continues to note L UE radicular pain with c-spine extension but less intense and stopped at elbow whereas prior to manual pain extended to hand.   PT Next Visit Plan c-spine manual and mechanical traction, scapular retraction and RC strengthening   Consulted and Agree with Plan of Care Patient        Problem List Patient Active Problem List   Diagnosis Date Noted  . Acute bronchitis 08/10/2014  . Unstable angina 05/29/2014  . Abnormal nuclear stress test 05/29/2014  . Chest pain 05/06/2014  . Exertional shortness of breath 05/06/2014  . Mixed hyperlipidemia 05/06/2014  . Hyperlipidemia with target LDL less than 70 08/05/2013  . HTN (hypertension) 08/05/2013  . Chest wall pain 08/05/2013  .  SINUSITIS, ACUTE 04/07/2009  . HYPOTHYROIDISM 12/10/2007  . DIABETES MELLITUS, TYPE II 05/28/2007  . Obstructive sleep apnea 05/28/2007  . Coronary atherosclerosis 05/28/2007  . Seasonal and perennial allergic rhinitis 05/28/2007  . COUGH, CHRONIC 05/28/2007    Sweta Halseth PT, OCS 03/18/2015, 12:24 PM  [facility] [address]  [place], [name], [address] Phone: [phone]   Fax:  [phone]

## 2015-03-23 ENCOUNTER — Ambulatory Visit: Payer: 59 | Admitting: Rehabilitation

## 2015-03-23 DIAGNOSIS — M542 Cervicalgia: Secondary | ICD-10-CM

## 2015-03-23 DIAGNOSIS — M25512 Pain in left shoulder: Secondary | ICD-10-CM

## 2015-03-23 DIAGNOSIS — R29898 Other symptoms and signs involving the musculoskeletal system: Secondary | ICD-10-CM

## 2015-03-23 NOTE — Therapy (Signed)
[facility] [address]  [place], [name], [address] Phone: [phone]   Fax:  [phone]  Physical Therapy Treatment  Patient Details  [name] MRN: [id number] Date of Birth: 05/28/1956 Referring Provider:  Roseanne Kaufman, MD  Encounter Date: 03/23/2015      PT End of Session - 03/23/15 0851    Visit Number 8   Number of Visits 12   Date for PT Re-Evaluation 03/30/15   PT Start Time 0851  pt not on the schedule but able to work in   PT Stop Time 0942   PT Time Calculation (min) 51 min      Past Medical History  Diagnosis Date  . Unspecified hypothyroidism   . Cough   . Acute bronchitis   . Obstructive sleep apnea (adult) (pediatric)   . Coronary atherosclerosis   . Anginal pain   . Hypertension   . Type II or unspecified type diabetes mellitus without mention of complication, not stated as uncontrolled     type 2  . Arthritis     osteo  . Anemia     hx of anemia    Past Surgical History  Procedure Laterality Date  . Tubal ligation    . Anterior fusion cervical spine    . Wisdom tooth extraction    . Left heart catheterization with coronary angiogram N/A 05/29/2014    Procedure: LEFT HEART CATHETERIZATION WITH CORONARY ANGIOGRAM;  Surgeon: Sinclair Grooms, MD;  Location: [facility] CATH LAB;  Service: Cardiovascular;  Laterality: N/A;    There were no vitals filed for this visit.  Visit Diagnosis:  Neck pain  Left shoulder pain  Shoulder weakness      Subjective Assessment - 03/23/15 0853    Subjective Felt real good after last time but noted increased pain Saturday and Sunday needing to take pain medication last night.    Currently in Pain? Yes   Pain Score --  5-6/10   Pain Location Scapula   Pain Orientation Left;Proximal      TODAY'S TREATMENT TherEx -  UBE level 2.0 90"/90" Hooklying on 1/2 FR: Pec / T-spine Extension stretch x2' bent and straight elbows, B ER  Red TB 15x, B Horiz ABD Green TB 15x, UE Flex with contra Ext Green TB 10x each Low Row 25# 15x  Manual - R sidelying STM L UT and parascapular muscles with scapular PROM incorporated. KT into scapular retraction bilaterally, Lt UT, Lt posterior shoulder 75% pull all strips  Mechanical Traction - c-spine, 20dg pull, 12#/6#, 60"/20", 12'      PT Long Term Goals - 03/03/15 1017    PT LONG TERM GOAL #1   Title pt reports able to perform ADLs, chores, and recreational activities without limitation by L shoulder or neck pain by 03/30/15   Status On-going   PT LONG TERM GOAL #2   Title L Shoulder AROM WFL and MMT 4+/5 or better grossly by 03/30/15   Status On-going               Plan - 03/23/15 0930    Clinical Impression Statement More manual work to [employer] scapula today due to increased pain over the weekend. Returned to original tape today per pt preference.    PT Next Visit Plan c-spine manual and mechanical traction, scapular retraction and RC strengthening   Consulted and Agree with Plan of Care Patient        Problem  List Patient Active Problem List   Diagnosis Date Noted  . Acute bronchitis 08/10/2014  . Unstable angina 05/29/2014  . Abnormal nuclear stress test 05/29/2014  . Chest pain 05/06/2014  . Exertional shortness of breath 05/06/2014  . Mixed hyperlipidemia 05/06/2014  . Hyperlipidemia with target LDL less than 70 08/05/2013  . HTN (hypertension) 08/05/2013  . Chest wall pain 08/05/2013  . SINUSITIS, ACUTE 04/07/2009  . HYPOTHYROIDISM 12/10/2007  . DIABETES MELLITUS, TYPE II 05/28/2007  . Obstructive sleep apnea 05/28/2007  . Coronary atherosclerosis 05/28/2007  . Seasonal and perennial allergic rhinitis 05/28/2007  . COUGH, CHRONIC 05/28/2007    Barbette Hair, PTA 03/23/2015, 9:32 AM  [facility] [place], [name], [address] Phone: [phone]   Fax:   [phone]

## 2015-03-25 ENCOUNTER — Ambulatory Visit: Payer: 59 | Admitting: Physical Therapy

## 2015-03-26 ENCOUNTER — Ambulatory Visit: Payer: 59 | Admitting: Rehabilitation

## 2015-03-26 DIAGNOSIS — M542 Cervicalgia: Secondary | ICD-10-CM

## 2015-03-26 DIAGNOSIS — M25512 Pain in left shoulder: Secondary | ICD-10-CM

## 2015-03-26 DIAGNOSIS — R29898 Other symptoms and signs involving the musculoskeletal system: Secondary | ICD-10-CM

## 2015-03-26 NOTE — Therapy (Signed)
[facility] [address]  [place], [name], [address] Phone: [phone]   Fax:  [phone]  Physical Therapy Treatment  Patient Details  [name] MRN: [id number] Date of Birth: [birth year] Referring Provider:  Roseanne Kaufman, MD  Encounter Date: 03/26/2015      PT End of Session - 03/26/15 0847    Visit Number 9   Number of Visits 12   Date for PT Re-Evaluation 03/30/15   PT Start Time 0845   PT Stop Time 0924   PT Time Calculation (min) 39 min      Past Medical History  Diagnosis Date  . Unspecified hypothyroidism   . Cough   . Acute bronchitis   . Obstructive sleep apnea (adult) (pediatric)   . Coronary atherosclerosis   . Anginal pain   . Hypertension   . Type II or unspecified type diabetes mellitus without mention of complication, not stated as uncontrolled     type 2  . Arthritis     osteo  . Anemia     hx of anemia    Past Surgical History  Procedure Laterality Date  . Tubal ligation    . Anterior fusion cervical spine    . Wisdom tooth extraction    . Left heart catheterization with coronary angiogram N/A 05/29/2014    Procedure: LEFT HEART CATHETERIZATION WITH CORONARY ANGIOGRAM;  Surgeon: Sinclair Grooms, MD;  Location: [facility] CATH LAB;  Service: Cardiovascular;  Laterality: N/A;    There were no vitals filed for this visit.  Visit Diagnosis:  Neck pain  Left shoulder pain  Shoulder weakness      Subjective Assessment - 03/26/15 0847    Subjective Reports feeling good after las ttime and noting pain around her shoulder blade today. Noted some Lt arm tingling yesterday.    Currently in Pain? Yes   Pain Score 5    Pain Location Scapula   Pain Orientation Left;Proximal   Pain Descriptors / Indicators Aching            [facility] PT Assessment - 03/26/15 0001    AROM   Right Shoulder Flexion 150 Degrees   Right Shoulder ABduction 110 Degrees  posterior arm pain    Left Shoulder Flexion 145 Degrees  pain (pt noted a torn rotator cuff)   Left Shoulder ABduction 100 Degrees   Cervical Extension 45   Cervical - Right Side Bend 25   Cervical - Left Side Bend 22   Cervical - Right Rotation 50   Cervical - Left Rotation 55      TODAY'S TREATMENT TherEx -  UBE level 2.0 90"/90" Hooklying on 1/2 FR: Pec / T-spine Extension stretch x2' straight elbows, B ER Green TB 15x, B Horiz ABD Green TB 15x, UE Flex with contra Ext Green TB 15x each Rt Side-Lying Lt ER 2# 10x, Circles at 90 degrees abduction 2# 10x CW/CCW, Open Book/Horizontal Abduction 10x3" Low Row 25# 15x, Alt Low Row 15# 10x Standing ball on wall Circles at 90 degrees flexion 10x CW/CCW  Manual - R sidelying STM Lt UT and parascapular muscles with scapular PROM incorporated. Supine STM to Lt UT, median nerve glides.        PT Long Term Goals - 03/03/15 1017    PT LONG TERM GOAL #1   Title pt reports able to perform ADLs, chores, and recreational activities without limitation by L shoulder or neck pain by 03/30/15  Status On-going   PT LONG TERM GOAL #2   Title L Shoulder AROM WFL and MMT 4+/5 or better grossly by 03/30/15   Status On-going               Plan - 03/26/15 0855    Clinical Impression Statement Improved motion noted with most directions but not as good as when manual is performed by PT. Pt continues to report improvements with pain and demonstrates improved tolerance to exercise. Held on replacing tape today due to pt coming in tomorrow and traction was in sure. Will return to traction tomorrow.     PT Next Visit Plan c-spine manual and mechanical traction, scapular retraction and RC strengthening   Consulted and Agree with Plan of Care Patient        Problem List Patient Active Problem List   Diagnosis Date Noted  . Acute bronchitis 08/10/2014  . Unstable angina 05/29/2014  . Abnormal nuclear stress test 05/29/2014  . Chest pain 05/06/2014  . Exertional  shortness of breath 05/06/2014  . Mixed hyperlipidemia 05/06/2014  . Hyperlipidemia with target LDL less than 70 08/05/2013  . HTN (hypertension) 08/05/2013  . Chest wall pain 08/05/2013  . SINUSITIS, ACUTE 04/07/2009  . HYPOTHYROIDISM 12/10/2007  . DIABETES MELLITUS, TYPE II 05/28/2007  . Obstructive sleep apnea 05/28/2007  . Coronary atherosclerosis 05/28/2007  . Seasonal and perennial allergic rhinitis 05/28/2007  . COUGH, CHRONIC 05/28/2007    Barbette Hair, PTA 03/26/2015, 9:25 AM  [facility] [place], [name], [address] Phone: [phone]   Fax:  [phone]

## 2015-03-27 ENCOUNTER — Ambulatory Visit: Payer: 59 | Admitting: Physical Therapy

## 2015-03-27 DIAGNOSIS — M542 Cervicalgia: Secondary | ICD-10-CM

## 2015-03-27 DIAGNOSIS — M25512 Pain in left shoulder: Secondary | ICD-10-CM | POA: Diagnosis not present

## 2015-03-27 DIAGNOSIS — R29898 Other symptoms and signs involving the musculoskeletal system: Secondary | ICD-10-CM

## 2015-03-27 NOTE — Therapy (Signed)
[facility] [address]  [place], [name], [address] Phone: [phone]   Fax:  [phone]  Physical Therapy Treatment  Patient Details  [name] MRN: [id number] Date of Birth: 24-Sep-1955 Referring Provider:  Roseanne Kaufman, MD  Encounter Date: 04-Apr-2015      PT End of Session - 2015-04-04 1109    Visit Number 10   Number of Visits 12   Date for PT Re-Evaluation 03/30/15   PT Start Time 1107   PT Stop Time 1155   PT Time Calculation (min) 48 min      Past Medical History  Diagnosis Date  . Unspecified hypothyroidism   . Cough   . Acute bronchitis   . Obstructive sleep apnea (adult) (pediatric)   . Coronary atherosclerosis   . Anginal pain   . Hypertension   . Type II or unspecified type diabetes mellitus without mention of complication, not stated as uncontrolled     type 2  . Arthritis     osteo  . Anemia     hx of anemia    Past Surgical History  Procedure Laterality Date  . Tubal ligation    . Anterior fusion cervical spine    . Wisdom tooth extraction    . Left heart catheterization with coronary angiogram N/A 05/29/2014    Procedure: LEFT HEART CATHETERIZATION WITH CORONARY ANGIOGRAM;  Surgeon: Sinclair Grooms, MD;  Location: [facility] CATH LAB;  Service: Cardiovascular;  Laterality: N/A;    There were no vitals filed for this visit.  Visit Diagnosis:  Neck pain  Left shoulder pain  Shoulder weakness      Subjective Assessment - April 04, 2015 1108    Subjective states feeling much better in L scapula today   Currently in Pain? Yes   Pain Score 2    Pain Location Scapula   Pain Orientation Left         TODAY'S TREATMENT TherEx - Hooklying on Foam Roll: pec/t-spine ext stretch 2'; Pullover 7# 12x; B ER Green TB 20x; B Horiz ABD Blue TB 15x; UE Flex with Contra Ext Green TB 15x each  Manual -  R Side-lying, L scapular mobes and L 1st rib mobes grade 2-3, TPR R UT/LS and  mid t-spine paraspinals; Prone PA mobes grade 3 throughout t-spine.  Vasopneumatic compression L shoulder, low pressure, 15', 38 dg                PT Long Term Goals - 03/03/15 1017    PT LONG TERM GOAL #1   Title pt reports able to perform ADLs, chores, and recreational activities without limitation by L shoulder or neck pain by 03/30/15   Status On-going   PT LONG TERM GOAL #2   Title L Shoulder AROM WFL and MMT 4+/5 or better grossly by 03/30/15   Status On-going               Plan - 04-Apr-2015 1150    Clinical Impression Statement pt with L scap/t-spine pain lately which seems more related to shoulder than neck.  Addressed today with manual relaxation techniques and trial of vaso to shoulder.  Will re-assess shoulder vs neck next week.   PT Next Visit Plan scapular retraction and RC strengthening assess shoulder vs c-spine pain   Consulted and Agree with Plan of Care Patient          G-Codes - 04-Apr-2015 1107    Functional Assessment Tool  Used foto 40% limitation   Functional Limitation Changing and maintaining body position   Changing and Maintaining Body Position Current Status [phone]) At least 40 percent but less than 60 percent impaired, limited or restricted   Changing and Maintaining Body Position Goal Status [id number]) At least 40 percent but less than 60 percent impaired, limited or restricted      Problem List Patient Active Problem List   Diagnosis Date Noted  . Acute bronchitis 08/10/2014  . Unstable angina 05/29/2014  . Abnormal nuclear stress test 05/29/2014  . Chest pain 05/06/2014  . Exertional shortness of breath 05/06/2014  . Mixed hyperlipidemia 05/06/2014  . Hyperlipidemia with target LDL less than 70 08/05/2013  . HTN (hypertension) 08/05/2013  . Chest wall pain 08/05/2013  . SINUSITIS, ACUTE 04/07/2009  . HYPOTHYROIDISM 12/10/2007  . DIABETES MELLITUS, TYPE II 05/28/2007  . Obstructive sleep apnea 05/28/2007  . Coronary atherosclerosis  05/28/2007  . Seasonal and perennial allergic rhinitis 05/28/2007  . COUGH, CHRONIC 05/28/2007    Blessen Kimbrough PT, OCS 03/27/2015, 11:57 AM  [facility] [place], [name], [address] Phone: [phone]   Fax:  [phone]

## 2015-03-30 ENCOUNTER — Encounter: Payer: 59 | Admitting: Physical Therapy

## 2015-04-01 ENCOUNTER — Ambulatory Visit: Payer: 59 | Admitting: Rehabilitation

## 2015-04-01 DIAGNOSIS — R29898 Other symptoms and signs involving the musculoskeletal system: Secondary | ICD-10-CM

## 2015-04-01 DIAGNOSIS — M25512 Pain in left shoulder: Secondary | ICD-10-CM

## 2015-04-01 DIAGNOSIS — M542 Cervicalgia: Secondary | ICD-10-CM

## 2015-04-01 NOTE — Therapy (Signed)
[facility] [address]  [place], [name], [address] Phone: [phone]   Fax:  [phone]  Physical Therapy Treatment  Patient Details  [name] MRN: [id number] Date of Birth: 10/31/1955 Referring Provider:  Roseanne Kaufman, MD  Encounter Date: 04/01/2015      PT End of Session - 04/01/15 0841    Visit Number 11   Number of Visits 12   Date for PT Re-Evaluation 03/30/15   PT Start Time 0839   PT Stop Time 0917   PT Time Calculation (min) 38 min      Past Medical History  Diagnosis Date  . Unspecified hypothyroidism   . Cough   . Acute bronchitis   . Obstructive sleep apnea (adult) (pediatric)   . Coronary atherosclerosis   . Anginal pain   . Hypertension   . Type II or unspecified type diabetes mellitus without mention of complication, not stated as uncontrolled     type 2  . Arthritis     osteo  . Anemia     hx of anemia    Past Surgical History  Procedure Laterality Date  . Tubal ligation    . Anterior fusion cervical spine    . Wisdom tooth extraction    . Left heart catheterization with coronary angiogram N/A 05/29/2014    Procedure: LEFT HEART CATHETERIZATION WITH CORONARY ANGIOGRAM;  Surgeon: Sinclair Grooms, MD;  Location: [facility] CATH LAB;  Service: Cardiovascular;  Laterality: N/A;    There were no vitals filed for this visit.  Visit Diagnosis:  Left shoulder pain  Neck pain  Shoulder weakness      Subjective Assessment - 04/01/15 0839    Subjective Reports she has been having pain all week and needed a pain pill last night. Does state that her neck and the lower pain spot are feeling much better. Puts pain at a 10/10 last night. Felt much better after last time.    Currently in Pain? Yes   Pain Score 7    Pain Location Scapula   Pain Orientation Left      TODAY'S TREATMENT TherEx - UBE level 2.0 90"/90" Hooklying on Foam Roll: pec/t-spine ext stretch 2';  Pullover 7# 15x; B ER Blue TB 12x; B Horiz ABD Blue TB 20x; UE Flex with Contra Ext Green TB 15x each Supine Circles at 90 degrees flexion 5# 2x15 CW/CCW, bilateral Rt Side-Lying Lt Open Book/Horizontal Abduction 10x5"  Manual - R Side-lying, L scapular mobes grade 2-3,  TPR Lt UT/LS, medial scapular border, and mid t-spine paraspinals       PT Long Term Goals - 03/03/15 1017    PT LONG TERM GOAL #1   Title pt reports able to perform ADLs, chores, and recreational activities without limitation by L shoulder or neck pain by 03/30/15   Status On-going   PT LONG TERM GOAL #2   Title L Shoulder AROM WFL and MMT 4+/5 or better grossly by 03/30/15   Status On-going               Plan - 04/01/15 0850    Clinical Impression Statement No complaint of pain with any exercises and good tolerance to increased weight/reps. Continues to noted tenderness/pain along Lt medial scapular border. Pt reports feeling much better after manual work. Pt declined ice today.   PT Next Visit Plan scapular retraction and RC strengthening, assess shoulder vs c-spine pain, renewal next week  Consulted and Agree with Plan of Care Patient        Problem List Patient Active Problem List   Diagnosis Date Noted  . Acute bronchitis 08/10/2014  . Unstable angina 05/29/2014  . Abnormal nuclear stress test 05/29/2014  . Chest pain 05/06/2014  . Exertional shortness of breath 05/06/2014  . Mixed hyperlipidemia 05/06/2014  . Hyperlipidemia with target LDL less than 70 08/05/2013  . HTN (hypertension) 08/05/2013  . Chest wall pain 08/05/2013  . SINUSITIS, ACUTE 04/07/2009  . HYPOTHYROIDISM 12/10/2007  . DIABETES MELLITUS, TYPE II 05/28/2007  . Obstructive sleep apnea 05/28/2007  . Coronary atherosclerosis 05/28/2007  . Seasonal and perennial allergic rhinitis 05/28/2007  . COUGH, CHRONIC 05/28/2007    Barbette Hair, PTA 04/01/2015, 9:15 AM  [facility] [address]  [place], [name], [address] Phone: [phone]   Fax:  [phone]

## 2015-04-03 ENCOUNTER — Ambulatory Visit: Payer: 59 | Admitting: Physical Therapy

## 2015-04-03 DIAGNOSIS — R29898 Other symptoms and signs involving the musculoskeletal system: Secondary | ICD-10-CM

## 2015-04-03 DIAGNOSIS — M25512 Pain in left shoulder: Secondary | ICD-10-CM

## 2015-04-03 DIAGNOSIS — M542 Cervicalgia: Secondary | ICD-10-CM

## 2015-04-03 NOTE — Therapy (Signed)
[facility] [address]  [place], [name], [address] Phone: [phone]   Fax:  [phone]  Physical Therapy Treatment  Patient Details  [name] MRN: [id number] Date of Birth: November 29, 1955 Referring Provider:  Roseanne Kaufman, MD  Encounter Date: 04/03/2015      PT End of Session - 04/03/15 0902    Visit Number 12   Number of Visits 20   Date for PT Re-Evaluation 05/01/15   PT Start Time 3734   PT Stop Time 1007   PT Time Calculation (min) 72 min      Past Medical History  Diagnosis Date  . Unspecified hypothyroidism   . Cough   . Acute bronchitis   . Obstructive sleep apnea (adult) (pediatric)   . Coronary atherosclerosis   . Anginal pain   . Hypertension   . Type II or unspecified type diabetes mellitus without mention of complication, not stated as uncontrolled     type 2  . Arthritis     osteo  . Anemia     hx of anemia    Past Surgical History  Procedure Laterality Date  . Tubal ligation    . Anterior fusion cervical spine    . Wisdom tooth extraction    . Left heart catheterization with coronary angiogram N/A 05/29/2014    Procedure: LEFT HEART CATHETERIZATION WITH CORONARY ANGIOGRAM;  Surgeon: Sinclair Grooms, MD;  Location: [facility] CATH LAB;  Service: Cardiovascular;  Laterality: N/A;    There were no vitals filed for this visit.  Visit Diagnosis:  Left shoulder pain  Neck pain  Shoulder weakness      Subjective Assessment - 04/03/15 0856    Subjective Pt with increase in L shoulder/scapula pain over the past week.  Following last treatment pt went home and pulled weeds and noted increased pain following this (2 days ago).  States noted great deal of pain yesterday so didn't do much stating pain was 10/10.  Currently 9/10 today.   Currently in Pain? Yes   Pain Score 9    Pain Location Scapula   Pain Orientation Left   Pain Radiating Towards extends down L UE  (anterior upper arm and at times down entire UE to back of hand and lateral 2 fingers), also notes intemittent N/T into lateral L hand.            [facility] PT Assessment - 04/03/15 0001    Assessment   Next MD Visit 04/08/15   AROM   Overall AROM Comments Neck AROM produces L upper medial scapular pain, Shoulder AROM produces shoulder and UE pain   Right Shoulder Flexion 157 Degrees   Right Shoulder ABduction 145 Degrees   Left Shoulder Flexion 150 Degrees  posterior arm pain   Left Shoulder ABduction 135 Degrees  top of shoulder and posterior arm pain   Cervical Extension 50  L lower neck pain   Cervical - Right Side Bend 19   Cervical - Left Side Bend 17  L Neck pain   Cervical - Right Rotation 52   Cervical - Left Rotation 47  L neck pain   Strength   Left Shoulder Flexion 4-/5  pain   Left Shoulder ABduction 4-/5  pain   Palpation   Palpation comment very TTP L supraspin tendon   Special Tests    Special Tests --  POS L UE ULTT (radian and median tests but pain is ulnar dis  Hawkins-Kennedy test   Findings Positive   Side Left   Belly Press   Findings Negative   Empty Can test   Findings Positive   Side Left   Full Can test   Findings Positive   Side Left         TODAY'S TREATMENT C-spine and Shoulder AROM and special testing  Manual - R Side-Lying STM and TPR throughout R scap mms (especially mid traps and rhomboid) along with scap mobes up to grade 3.  TherEx -  R Side-Lying L ER AROM 20x 4 strips kinesiotape to L shoulder: 30% anterior and posterior deltoid, 75% subacromial space, 50% lateral deltoid  Vasopneumatic Compression - L Shoulder, Low Pressure, 15', 38dg             PT Long Term Goals - 04/03/15 1012    PT LONG TERM GOAL #1   Title pt reports able to perform ADLs, chores, and recreational activities without limitation by L shoulder or neck pain by 05/01/15   Status On-going   PT LONG TERM GOAL #2   Title L Shoulder AROM WFL  and MMT 4+/5 or better grossly by 05/01/15  AROM is painful but [facility], MMT still limited to 4-/5 due to pain   Status Partially Met               Plan - 04/03/15 0957    Clinical Impression Statement pt made great progress initially with PT noting near pain-free for 2 weeks but then pain began to return mid August and has increased in intensity lately with pt noting up to 10/10 pain in L shoulder/scapula at times.  Pt denies knowing reason for increased pain in general, but does note increased pain with performing yard work.  B shoulder AROM has improved a great deal and is WFL at this time but still with c/o L shoulder pain with all end-ranges.  States pain extends down L arm to hand and notes intermittent n/t into lateral hand and digits #4-5.  Able to reproduce these symptoms with shoulder mobes at times.  C-spine AROM displays mild improvement into extension but continues to note L Lower neck pai with L Side-bending and L rotation.  C-spine AROM does not produce L UE symptoms, jut lower neck and upper scapular pain.  Pt TTP throughout lateral aspect of L supraspinatus as well as to medial and superior scapular muscles (high tone noted in rhomboid and portions of upper trap).  Bulk of UE pain seems related to L Shoulder (RC strain) but there is a component of c-spine involvement as well (upper scap pain with neck AROM and POS L UE ULTT testing).  Whereas pt with increased pain lately, she still displays improved mobility and we are therefore hopeful that she with continue to progress.  She has attended 12 treatments to date but I would like her to continue with PT 2x/wk for up to 4 more weeks to work towards remaining goals.   Pt will benefit from skilled therapeutic intervention in order to improve on the following deficits Pain;Decreased strength;Postural dysfunction;Improper body mechanics;Decreased range of motion   Rehab Potential Good   PT Frequency 2x / week   PT Duration 4 weeks   PT  Treatment/Interventions Therapeutic exercise;Therapeutic activities;Manual techniques;Taping;Dry needling;Patient/family education;Neuromuscular re-education;Electrical Stimulation;Vasopneumatic Device   PT Next Visit Plan scapular retraction and RC strengthening, taping PRN, manual PRN   Consulted and Agree with Plan of Care Patient        Problem List Patient  Active Problem List   Diagnosis Date Noted  . Acute bronchitis 08/10/2014  . Unstable angina 05/29/2014  . Abnormal nuclear stress test 05/29/2014  . Chest pain 05/06/2014  . Exertional shortness of breath 05/06/2014  . Mixed hyperlipidemia 05/06/2014  . Hyperlipidemia with target LDL less than 70 08/05/2013  . HTN (hypertension) 08/05/2013  . Chest wall pain 08/05/2013  . SINUSITIS, ACUTE 04/07/2009  . HYPOTHYROIDISM 12/10/2007  . DIABETES MELLITUS, TYPE II 05/28/2007  . Obstructive sleep apnea 05/28/2007  . Coronary atherosclerosis 05/28/2007  . Seasonal and perennial allergic rhinitis 05/28/2007  . COUGH, CHRONIC 05/28/2007    Martiza Speth PT, OCS 04/03/2015, 10:15 AM  [facility] [place], [name], [address] Phone: [phone]   Fax:  [phone]

## 2015-04-07 ENCOUNTER — Ambulatory Visit: Payer: 59 | Admitting: Physical Therapy

## 2015-04-07 DIAGNOSIS — M542 Cervicalgia: Secondary | ICD-10-CM

## 2015-04-07 DIAGNOSIS — R29898 Other symptoms and signs involving the musculoskeletal system: Secondary | ICD-10-CM

## 2015-04-07 DIAGNOSIS — M25512 Pain in left shoulder: Secondary | ICD-10-CM

## 2015-04-07 NOTE — Therapy (Signed)
[facility] [address]  [place], [name], [address] Phone: [phone]   Fax:  [phone]  Physical Therapy Treatment  Patient Details  [name] MRN: [id number] Date of Birth: 1956-01-11 Referring Provider:  Roseanne Kaufman, MD  Encounter Date: 04/07/2015      PT End of Session - 04/07/15 1030    Visit Number 13   Number of Visits 20   Date for PT Re-Evaluation 05/01/15   PT Start Time 1026   PT Stop Time 1101   PT Time Calculation (min) 35 min      Past Medical History  Diagnosis Date  . Unspecified hypothyroidism   . Cough   . Acute bronchitis   . Obstructive sleep apnea (adult) (pediatric)   . Coronary atherosclerosis   . Anginal pain   . Hypertension   . Type II or unspecified type diabetes mellitus without mention of complication, not stated as uncontrolled     type 2  . Arthritis     osteo  . Anemia     hx of anemia    Past Surgical History  Procedure Laterality Date  . Tubal ligation    . Anterior fusion cervical spine    . Wisdom tooth extraction    . Left heart catheterization with coronary angiogram N/A 05/29/2014    Procedure: LEFT HEART CATHETERIZATION WITH CORONARY ANGIOGRAM;  Surgeon: Sinclair Grooms, MD;  Location: [facility] CATH LAB;  Service: Cardiovascular;  Laterality: N/A;    There were no vitals filed for this visit.  Visit Diagnosis:  Left shoulder pain  Neck pain  Shoulder weakness      Subjective Assessment - 04/07/15 1026    Subjective States was sore approx one day following last treatment but then pain began to decrease and has been feeling much better past 2-3 days.  Has returned to work past 2 days and has not noted increased pain related to this.  Pain was 9/10 at last treatment and pt rates pain 2/10 today.  Pain noted inferior aspect of posterior shoulder.  States is sleeping well and does not require meds.   Currently in Pain? Yes   Pain Score 2     Pain Location Scapula   Pain Orientation Left        TODAY'S TREATMENT Manual - R Side-Lying STM and TPR L infraspinatus and teres mms.  TherEx -  R Side-Lying L ER AROM 12x R Side-Lying L Horiz ABD AROM 12x R Side-Lying L ABD AROM 12x  Manual - Supine L GH AP and Caudal glides grade 3, L UE nerve glides (median bias), L ACJ mobes grade 3 caudal and AP mobes, L 1st rib mobes              PT Long Term Goals - 04/03/15 1012    PT LONG TERM GOAL #1   Title pt reports able to perform ADLs, chores, and recreational activities without limitation by L shoulder or neck pain by 05/01/15   Status On-going   PT LONG TERM GOAL #2   Title L Shoulder AROM WFL and MMT 4+/5 or better grossly by 05/01/15  AROM is painful but WFL, MMT still limited to 4-/5 due to pain   Status Partially Met               Plan - 04/07/15 1106    Clinical Impression Statement pt good progress after last treatment and has been feeling much  better past few days.  Still with TTP L 1st rib and lateral aspect of infraspin vs teres but improved.   PT Next Visit Plan scapular retraction and RC strengthening, taping PRN, manual PRN   Consulted and Agree with Plan of Care Patient        Problem List Patient Active Problem List   Diagnosis Date Noted  . Acute bronchitis 08/10/2014  . Unstable angina 05/29/2014  . Abnormal nuclear stress test 05/29/2014  . Chest pain 05/06/2014  . Exertional shortness of breath 05/06/2014  . Mixed hyperlipidemia 05/06/2014  . Hyperlipidemia with target LDL less than 70 08/05/2013  . HTN (hypertension) 08/05/2013  . Chest wall pain 08/05/2013  . SINUSITIS, ACUTE 04/07/2009  . HYPOTHYROIDISM 12/10/2007  . DIABETES MELLITUS, TYPE II 05/28/2007  . Obstructive sleep apnea 05/28/2007  . Coronary atherosclerosis 05/28/2007  . Seasonal and perennial allergic rhinitis 05/28/2007  . COUGH, CHRONIC 05/28/2007    Tennyson Wacha PT, OCS 04/07/2015, 11:08 AM  [facility] [place], [name], [address] Phone: [phone]   Fax:  [phone]

## 2015-04-10 ENCOUNTER — Ambulatory Visit: Payer: 59 | Attending: Orthopedic Surgery | Admitting: Rehabilitation

## 2015-04-10 DIAGNOSIS — R29898 Other symptoms and signs involving the musculoskeletal system: Secondary | ICD-10-CM | POA: Diagnosis present

## 2015-04-10 DIAGNOSIS — M25512 Pain in left shoulder: Secondary | ICD-10-CM | POA: Insufficient documentation

## 2015-04-10 DIAGNOSIS — M542 Cervicalgia: Secondary | ICD-10-CM | POA: Diagnosis present

## 2015-04-10 NOTE — Therapy (Addendum)
[facility] [address]  [place], [name], [address] Phone: [phone]   Fax:  [phone]  Physical Therapy Treatment  Patient Details  [name] MRN: [id number] Date of Birth: 1955-11-10 Referring Provider:  Roseanne Kaufman, MD  Encounter Date: 04/10/2015      PT End of Session - 04/10/15 1021    Visit Number 14   Number of Visits 20   Date for PT Re-Evaluation 05/01/15   PT Start Time 1021   PT Stop Time 1057   PT Time Calculation (min) 36 min      Past Medical History  Diagnosis Date  . Unspecified hypothyroidism   . Cough   . Acute bronchitis   . Obstructive sleep apnea (adult) (pediatric)   . Coronary atherosclerosis   . Anginal pain   . Hypertension   . Type II or unspecified type diabetes mellitus without mention of complication, not stated as uncontrolled     type 2  . Arthritis     osteo  . Anemia     hx of anemia    Past Surgical History  Procedure Laterality Date  . Tubal ligation    . Anterior fusion cervical spine    . Wisdom tooth extraction    . Left heart catheterization with coronary angiogram N/A 05/29/2014    Procedure: LEFT HEART CATHETERIZATION WITH CORONARY ANGIOGRAM;  Surgeon: Sinclair Grooms, MD;  Location: [facility] CATH LAB;  Service: Cardiovascular;  Laterality: N/A;    There were no vitals filed for this visit.  Visit Diagnosis:  Left shoulder pain  Neck pain  Shoulder weakness      Subjective Assessment - 04/10/15 1022    Subjective Reports doing ok today. Felt wonderful after last time.    Currently in Pain? Yes   Pain Score --  4-5/10   Pain Location Neck   Pain Orientation Left       TODAY'S TREATMENT TherEx -  UBE level 2.0 90"/90"  Manual -  R Side-Lying STM and TPR L infraspinatus and teres mms. L UE nerve glides (median bias) Supine STM to Lt scalenes, UT with and without stretch   TherEx -  R Side-Lying L ER AROM 12x R  Side-Lying L Horiz ABD AROM 12x R Side-Lying L ABD AROM 12x       PT Long Term Goals - 04/10/15 1056    PT LONG TERM GOAL #1   Title pt reports able to perform ADLs, chores, and recreational activities without limitation by L shoulder or neck pain by 05/01/15   Status On-going   PT LONG TERM GOAL #2   Title L Shoulder AROM WFL and MMT 4+/5 or better grossly by 05/01/15   Status Partially Met        G-Code: Change/Maintain body position - Current and Discharge status 20-40% limitation (CJ); Goal was CJ       Plan - 04/10/15 1057    Clinical Impression Statement Focused more on neck with manual work today per pt request due to increased pain here. Very tender upper trap and scalenes today but relieved with manual work per pt. Pt also declined tape to see how she was doing.    PT Next Visit Plan scapular retraction and RC strengthening, taping PRN, manual PRN   Consulted and Agree with Plan of Care Patient        Problem List Patient Active Problem List   Diagnosis Date Noted  .  Acute bronchitis 08/10/2014  . Unstable angina 05/29/2014  . Abnormal nuclear stress test 05/29/2014  . Chest pain 05/06/2014  . Exertional shortness of breath 05/06/2014  . Mixed hyperlipidemia 05/06/2014  . Hyperlipidemia with target LDL less than 70 08/05/2013  . HTN (hypertension) 08/05/2013  . Chest wall pain 08/05/2013  . SINUSITIS, ACUTE 04/07/2009  . HYPOTHYROIDISM 12/10/2007  . DIABETES MELLITUS, TYPE II 05/28/2007  . Obstructive sleep apnea 05/28/2007  . Coronary atherosclerosis 05/28/2007  . Seasonal and perennial allergic rhinitis 05/28/2007  . COUGH, CHRONIC 05/28/2007    Barbette Hair, PTA 04/10/2015, 10:59 AM  [facility] [address]  [place], [name], [address] Phone: [phone]   Fax:  [phone]     PHYSICAL THERAPY DISCHARGE SUMMARY  Visits from Start of Care: 14  Current functional level  related to goals / functional outcomes: Goals met or nearly met   Remaining deficits: Intermittent neck pain   Education / Equipment: HEP Plan: Patient agrees to discharge.  Patient goals were partially met. Patient is being discharged due to being pleased with the current functional level.  ?????       [name] was last seen on 04/10/15 at which time she had shown good progress but still with intermittent neck pain.  She was contacted via phone on 04/22/15 and she states she is doing well and no longer requires PT.  We are therefore discharging her from our care at this time.  Leonette Most PT, OCS 04/27/2015 10:48 AM

## 2015-04-15 ENCOUNTER — Other Ambulatory Visit: Payer: Self-pay | Admitting: Internal Medicine

## 2015-06-09 ENCOUNTER — Other Ambulatory Visit: Payer: Self-pay | Admitting: Cardiovascular Disease

## 2015-06-10 ENCOUNTER — Ambulatory Visit: Payer: 59 | Admitting: Internal Medicine

## 2015-06-11 ENCOUNTER — Telehealth: Payer: Self-pay | Admitting: Internal Medicine

## 2015-06-11 ENCOUNTER — Ambulatory Visit: Payer: 59 | Admitting: Internal Medicine

## 2015-06-11 MED ORDER — AZITHROMYCIN 250 MG PO TABS: ORAL_TABLET | ORAL | Status: DC

## 2015-06-11 MED ORDER — HYDROCODONE-HOMATROPINE 5-1.5 MG/5ML PO SYRP: 5.0000 mL | ORAL_SOLUTION | Freq: Four times a day (QID) | ORAL | Status: DC | PRN

## 2015-06-11 NOTE — Telephone Encounter (Signed)
Called spoke with pt. She c/o chest congestion, prod cough ( dark green phlem), blowing out green from nose, wheezing, chest tx as well. NO f/c/s/n/v.  Requesting something to be called in Pt also written RX for hycodan. Last refilled 12/08/14 #240 ml x 0 refills. Take 5 mLs by mouth every 6 (six) hours as needed for cough Please advise Dr. Annamaria Boots thanks  Allergies  Allergen Reactions  . Codeine     REACTION: hallucinations/"loopy"  . Levaquin [Levofloxacin] Nausea And Vomiting  . Sulfonamide Derivatives Hives  . Sulfa Antibiotics Hives    Current Outpatient Prescriptions on File Prior to Visit  Medication Sig Dispense Refill  . ADVAIR DISKUS 250-50 MCG/DOSE AEPB INHALE ONE (1) PUFF(S) TWICE DAILY, RINSE MOUTH. 60 each 6  . albuterol (PROVENTIL HFA) 108 (90 BASE) MCG/ACT inhaler Inhale 2 puffs into the lungs every 6 (six) hours as needed for wheezing or shortness of breath. For shortness of breath and wheezing 1 Inhaler prn  . albuterol (PROVENTIL) (2.5 MG/3ML) 0.083% nebulizer solution Take 3 mLs (2.5 mg total) by nebulization every 6 (six) hours as needed for wheezing or shortness of breath. 75 mL 12  . aspirin 81 MG chewable tablet Chew 1 tablet (81 mg total) by mouth daily.    Marland Kitchen atorvastatin (LIPITOR) 80 MG tablet Take 80 mg by mouth every evening.     . clopidogrel (PLAVIX) 75 MG tablet Take 1 tablet by mouth daily.    . diazepam (VALIUM) 5 MG tablet Take 0.5 tablets by mouth as needed. .5 to 1 tablet prn for muscle cramps    . diclofenac (VOLTAREN) 75 MG EC tablet Take 75 mg by mouth daily as needed. For muscle aches    . Diclofenac Sodium CR 100 MG 24 hr tablet Take 100 mg by mouth 2 (two) times daily.    Marland Kitchen esomeprazole (NEXIUM) 40 MG capsule Take 40 mg by mouth daily before breakfast.      . gabapentin (NEURONTIN) 100 MG capsule Take 1 capsule (100 mg total) by mouth 3 (three) times daily. (Patient not taking: Reported on 03/02/2015) 90 capsule 0  . HYDROcodone-homatropine (HYCODAN)  5-1.5 MG/5ML syrup Take 5 mLs by mouth every 6 (six) hours as needed for cough. 240 mL 0  . Insulin Detemir (LEVEMIR FLEXPEN [place]) Inject 15 Units into the skin at bedtime.     . INVOKANA 100 MG TABS Take 100 mg by mouth daily before breakfast.     . isosorbide mononitrate (IMDUR) 120 MG 24 hr tablet Take 1 tablet (120 mg total) by mouth daily. 30 tablet 9  . levothyroxine (SYNTHROID, LEVOTHROID) 200 MCG tablet Take 200 mcg by mouth daily.    . metFORMIN (GLUMETZA) 1000 MG (MOD) 24 hr tablet Take 1 tablet (1,000 mg total) by mouth 2 (two) times daily with a meal. HOLD for 48 hours, restart on 06/01/2014.    . metoprolol succinate (TOPROL-XL) 25 MG 24 hr tablet Take 25 mg by mouth daily.      . mometasone-formoterol (DULERA) 200-5 MCG/ACT AERO Inhale 2 puffs into the lungs 2 (two) times daily.    Marland Kitchen NITROSTAT 0.4 MG SL tablet Place 1 tablet under the tongue every 5 (five) minutes as needed for chest pain.     Marland Kitchen NOVOTWIST 32G X 5 MM MISC Use as directed    . omega-3 acid ethyl esters (LOVAZA) 1 G capsule TAKE 2 CAPSULES (2 G TOTAL) BY MOUTH 2 (TWO) TIMES DAILY. 120 capsule 0  . ONE TOUCH ULTRA  TEST test strip Use as directed    . triamterene-hydrochlorothiazide (DYAZIDE) 37.5-25 MG per capsule Take 1 capsule by mouth every morning.      . triamterene-hydrochlorothiazide (MAXZIDE-25) 37.5-25 MG per tablet Take 1 tablet by mouth daily.    Marland Kitchen UNABLE TO FIND Med Name: [place]  16 units QHS    . verapamil (CALAN-SR) 240 MG CR tablet Take 1 tablet by mouth daily.     No current facility-administered medications on file prior to visit.

## 2015-06-11 NOTE — Telephone Encounter (Signed)
Offer Z pak  Ok to refill cough syrup

## 2015-06-11 NOTE — Telephone Encounter (Signed)
Spoke with the pt and notified of recs per CDY  Pt verbalized understanding  Nothing further needed  Zpack sent to pharm and hycodan rx up front for pick up

## 2015-06-30 ENCOUNTER — Telehealth: Payer: Self-pay | Admitting: Internal Medicine

## 2015-06-30 MED ORDER — ALBUTEROL SULFATE (2.5 MG/3ML) 0.083% IN NEBU: 2.5000 mg | INHALATION_SOLUTION | Freq: Four times a day (QID) | RESPIRATORY_TRACT | Status: DC | PRN

## 2015-06-30 NOTE — Telephone Encounter (Signed)
Spoke with pt. Needs refill on Albuterol nebs. This has been sent in. Nothing further was needed.

## 2015-07-09 ENCOUNTER — Other Ambulatory Visit: Payer: Self-pay | Admitting: Cardiovascular Disease

## 2015-07-09 NOTE — Telephone Encounter (Signed)
REFILL 

## 2015-07-10 ENCOUNTER — Encounter: Payer: Self-pay | Admitting: Internal Medicine

## 2015-07-10 ENCOUNTER — Ambulatory Visit (INDEPENDENT_AMBULATORY_CARE_PROVIDER_SITE_OTHER): Payer: 59 | Admitting: Internal Medicine

## 2015-07-10 ENCOUNTER — Ambulatory Visit (INDEPENDENT_AMBULATORY_CARE_PROVIDER_SITE_OTHER)
Admission: RE | Admit: 2015-07-10 | Discharge: 2015-07-10 | Disposition: A | Discharge status: Home / Self Care | Payer: 59 | Source: Ambulatory Visit | Attending: Internal Medicine | Admitting: Internal Medicine

## 2015-07-10 VITALS — BP 126/70 | HR 90 | Ht 62.0 in | Wt 166.6 lb

## 2015-07-10 DIAGNOSIS — J019 Acute sinusitis, unspecified: Secondary | ICD-10-CM

## 2015-07-10 DIAGNOSIS — G4733 Obstructive sleep apnea (adult) (pediatric): Secondary | ICD-10-CM | POA: Diagnosis not present

## 2015-07-10 DIAGNOSIS — J209 Acute bronchitis, unspecified: Secondary | ICD-10-CM

## 2015-07-10 MED ORDER — HYDROCODONE-HOMATROPINE 5-1.5 MG/5ML PO SYRP: 5.0000 mL | ORAL_SOLUTION | Freq: Four times a day (QID) | ORAL | Status: DC | PRN

## 2015-07-10 MED ORDER — LEVALBUTEROL HCL 0.63 MG/3ML IN NEBU
0.6300 mg | INHALATION_SOLUTION | Freq: Once | RESPIRATORY_TRACT | Status: AC
  Administered 2015-07-10: 0.63 mg via RESPIRATORY_TRACT

## 2015-07-10 MED ORDER — METHYLPREDNISOLONE ACETATE 80 MG/ML IJ SUSP
80.0000 mg | Freq: Once | INTRAMUSCULAR | Status: AC
  Administered 2015-07-10: 80 mg via INTRAMUSCULAR

## 2015-07-10 MED ORDER — MOXIFLOXACIN HCL 400 MG PO TABS: 400.0000 mg | ORAL_TABLET | Freq: Every day | ORAL | Status: DC

## 2015-07-10 NOTE — Assessment & Plan Note (Signed)
Plan-Avelox, chest x-ray, Depo-Medrol, nebulizer Xopenex, cough syrup

## 2015-07-10 NOTE — Patient Instructions (Addendum)
Neb xop 0.63  Depo 80  Order- CXR    Dx acute bronchitis  Neb xop 0.63  Depo 80  Script refilling hycodan cough syrup  Script avelox   Sometimes it helps while on an antibiotic to add a probiotic like Florastor, Align, or Activia yogurt

## 2015-07-10 NOTE — Progress Notes (Signed)
Patient ID: [name], female    DOB: [birth year], 59 y.o.   MRN: [id number]  HPI 11/08/10- 59 yo F former smoker, followed here for obstructive sleep apnea and for hx bronchitis.  She was recently treated by her PCP for acute bronchitis. Onset after mowing grass. Still coughing up thick greenish yellow mucus after augmentin they gave her. She thinks she does better with doxycycline. Denies fever, swollen glands, sore throat.  Last seen here May 10, 2010. Hx of allergy testing but never on vaccine. Pollen is bothering with nasal congestion and wheezey cough more this year.  Continues CPAP at 13 cwp all night every night.  Sleeps ok. She asks about alternatives and we discussed palatal surgeries, oral appliances and Provent nasal valves. [name] Twin sister had MI.  05/10/11- 62 yo F former smoker, followed here for obstructive sleep apnea and for hx bronchitis, allergic rhinitis, complicated by DM, hypothyroid, CAD  Since last here, Dr. Claiborne Billings placed a coronary stent. 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  CPAP compliance is good all night every night. She isn't sure about mask and we discussed going to [facility] to look at alternatives.  Wants flu vac. She still feels tight in the chest that she continues Advair 250. Some wheeze. Uses rescue inhaler and asks about getting a nebulizer machine. We had called in a Z-Pak which she says was not strong enough. She denies discolored sputum or fever. We discussed using cortisone and anticipated impact on her insulin-dependent diabetes temporarily. She complains of a "raw" substernal sensation but thinks Nexium controls her reflux well.  08/29/11-  38 yo F former smoker, followed here for obstructive sleep apnea and for hx bronchitis, allergic rhinitis, complicated by DM, hypothyroid, CAD  Acute visit-  deep cough-yellow in color and streaks of blood at times; SOB and wheezing-went to ER Saturday([facility])Lower  back pain  she did have flu vaccine. Last week had GI upset with vomiting and right upper quadrant pain. She still has her gallbladder. That discomfort has eased off. 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 As her GI upset resolved, she developed chest tightness with cough and cold symptoms, fever and chills 3 days ago now resolved. Head and chest are still congested. [facility] his nebulizer treatment x2 started prednisone taper. She  Is finishing doxycycline with 2 days left. CHEST - 2 VIEW 08/27/11-  Comparison: 05/10/2011  Findings: Mild peribronchial thickening. Heart and mediastinal  contours are within normal limits. No focal opacities or  effusions. No acute bony abnormality.  IMPRESSION:  Mild bronchitic changes.  Original Report Authenticated By: [name], M.D.   11/08/11- 48 yo F former smoker, followed here for obstructive sleep apnea and for hx bronchitis, allergic rhinitis, complicated by DM, hypothyroid, CAD  Continues using CPAP 13 almost every night. No sleeping pills. Nebulizer treatment and Depo-Medrol did help her breathing last visit. She would like a home nebulizer which we discussed. Singulair did not help. Blames her cough on postnasal drip or getting too hot. Has history of occasional thrush which might also contribute to throat discomfort.   05/11/2012 Acute OV  Complains of head congestion w/ green drainage, PND, prod cough with gree mucus, hoarseness, increased SOB, wheezing, some tightness x2 weeks - denies f/c/s.  has finished abx and pred thru PCP w/in last 2 weeks Took keflex and steroid taper with minimal  improvement. Has sinus congestion, ear fullness, hoarseness and drainage.  No hemotpysis or chest pain  No edema.  OTC not helping with cough.   06/29/12- 72 yo F former smoker, followed here for obstructive sleep apnea and for hx bronchitis, allergic rhinitis, complicated by DM, hypothyroid,  CAD  FOLLOWS FOR: still having cough and feels fullness in chest coming back since seeign TP.; discomfort on right side lung area CPAP 13 Feeling better after she was seen by the nurse practitioner October 4. Now has caught a new cold. Sneezing. Hard cough caused sharp pain right lateral rib. Area still hurts to cough. Scant phlegm, mild sore throat, no fever but did have some chills originally. Took Augmentin then amoxicillin used for dental prophylaxis. CXR 05/11/12-reviewed IMPRESSION:  No acute cardiopulmonary abnormality.  Original Report Authenticated By: [name], M.D.   11/06/12- 54 yo F former smoker, followed here for obstructive sleep apnea and for hx bronchitis, allergic rhinitis, complicated by DM, hypothyroid, CAD , OSA FOLLOW FOR:c/o sinus congestion,chest tightness x 4 days,mild sob w exertion,,cough and nasal congestion yellow,,right ear stopped up and hurting,Finished mouthwash that was called in 2 wks. Ago Eyes itch, postnasal drip, malaise x 4 days. Amoxacillin 2 weeks ago for ?strep throat. Diazepam for cramps.  CPAP 13/ [facility].   06/18/13- 59 yo F former smoker, followed here for obstructive sleep apnea and for hx bronchitis, allergic rhinitis, complicated by DM, hypothyroid, CAD , OSA FOLLOWS FOR: Breathing is slightly improved. Reports SOB and wheezing. Denies chest tightness today. Is producing green mucus from nose. Denies fever or sore throat. Using Mucinex and saline rinse. Had flu vaccine. CPAP 13/ [facility] is not being used as much as she should. Using Nexium, feels no reflux or heartburn  01/23/14- 82 yo F former smoker, followed here for obstructive sleep apnea and for hx bronchitis, allergic rhinitis, complicated by DM, hypothyroid, CAD , OSA FOLLOWS FOR:  Having increased sob with exertion, wheezing and cough with yellow mucus x1 month.  Pt has done round of anitbiotics and pred shot twice. CPAP/13/[facility] Onset of sinusitis in March with extension to  chest/bronchitis. Urgent care treated with steroid shot, antibiotic shot and then cephalosporin. She started itching attributed to the antibiotic. Chest x-ray was clear. She was given repeat steroid and antibiotic injections. Still blowing green from her nose. Using Sudafed and saline nasal rinse.no fever or headache recognized CXR 09/02/13 IMPRESSION:  No acute disease.  Electronically Signed  By: Inge Rise M.D.  On: 09/02/2013 20:28  08/04/14- 74 yo F former smoker, followed here for obstructive sleep apnea and for hx bronchitis, allergic rhinitis, complicated by DM, hypothyroid, CAD , OSA FOLLOWS FOR: DME [id number] had respiratory infection over Thanksgiving. Dr. Forde Dandy gave abx (Augmentin) last week and she feels better. She got steroid injection by Dr. Amedeo Plenty last week as well. Patient still on abx, she still has sl cough and wheeze at times. She denies sob at this time . Abnl nuclear stress test then cath- CAD/ stents, no progression CPAP 13/ [facility]- all night every night  12/08/14- 58 yo F former smoker, followed here for obstructive sleep apnea and for hx bronchitis, allergic rhinitis, complicated by DM, hypothyroid, CAD , OSA Follows for: prod cough at times w/yellow mucus; hoarseness; feels like back of throat is "gritty"; feels R side stopped up; x 1 mth CPAP 13/ [facility]- all night every night Gabapentin some help with chronic cough at 100 mg 3 times a day. Right ear itches. Cough worse at night,  lying down or if hot or humid.  07/10/2015-59 year old female former smoker followed for OSA, chronic bronchitis/chronic cough, allergic rhinitis, complicated by DM, hypothyroid, CAD CPAP 13/[facility] FOLLOWS FOR: Pt states cough-productive-yellow in color has come back; SOB and wheezing as well. Had chills Wednesday night but none since-did not check for fever. Waxing and waning malaise and cough, some recurrent chills and sneezing without fever or purulent sputum. Augmentin had seemed  to help but symptoms never completely cleared and Zithromax did little. Cough with scant phlegm. This all began as a sinus infection. Continues CPAP with good compliance and control described, 13  ROS-see HPI Constitutional:   No-   weight loss, night sweats, fevers, chills, fatigue, lassitude. HEENT:   No-  headaches, difficulty swallowing, tooth/dental problems, sore throat,      No-sneezing, itching, ear ache, +nasal congestion, + post nasal drip,  CV:  No- chest pain, no-orthopnea, PND, swelling in lower extremities, anasarca,  dizziness, palpitations Resp: No-   shortness of breath with exertion or at rest.            +  productive cough,  + non-productive cough,  No- coughing up of blood.              No-change in color of mucus.  No- wheezing.   Skin: No-   rash or lesions. GI:  No-   heartburn, indigestion, abdominal pain, nausea, vomiting,  GU: . MS:  No-   joint pain or swelling.  . Neuro-     nothing unusual Psych:  No- change in mood or affect. No depression or anxiety.  No memory loss.  Objective:   Physical Exam OBJ- Physical Exam General- Alert, Oriented, Affect-appropriate, Distress- none acute Skin- rash-none, lesions- none, excoriation- none Lymphadenopathy- none Head- atraumatic            Eyes- + periorbital edema, Gross vision intact, PERRLA, conjunctivae and secretions clear            Ears- Hearing, canals-normal,             Nose- No- turb edema,  no-Septal dev, mucus, polyps, erosion, perforation             Throat- Mallampati II , mucosa clear , drainage- none, tonsils- atrophic Neck- flexible , trachea midline, no stridor , thyroid nl, carotid no bruit Chest - symmetrical excursion , unlabored           Heart/CV- RRR , no murmur , no gallop  , no rub, nl s1 s2                           - JVD- none , edema- none, stasis changes- none, varices- none           Lung-  +Coarse breath sounds, but No-wheeze,  cough +, dullness-none, rub- none           Chest  wall-  Abd-  Br/ Gen/ Rectal- Not done, not indicated Extrem- cyanosis- none, clubbing, none, atrophy- none, strength- nl Neuro- grossly intact to observation

## 2015-07-10 NOTE — Assessment & Plan Note (Signed)
Suspect current complaint relates to a persistent sinus infection after acute onset about 3 weeks ago. If she doesn't respond to antibiotic she may need CT of sinuses Plan-refill cough syrup, Avelox 10 days

## 2015-07-10 NOTE — Assessment & Plan Note (Signed)
Continues to use and depend on CPAP 13 with good compliance and control

## 2015-07-12 ENCOUNTER — Emergency Department (HOSPITAL_BASED_OUTPATIENT_CLINIC_OR_DEPARTMENT_OTHER): Payer: 59

## 2015-07-12 ENCOUNTER — Emergency Department (HOSPITAL_BASED_OUTPATIENT_CLINIC_OR_DEPARTMENT_OTHER)
Admission: EM | Admit: 2015-07-12 | Discharge: 2015-07-12 | Disposition: A | Discharge status: Home / Self Care | Payer: 59 | Attending: Emergency Medicine | Admitting: Emergency Medicine

## 2015-07-12 ENCOUNTER — Encounter (HOSPITAL_BASED_OUTPATIENT_CLINIC_OR_DEPARTMENT_OTHER): Payer: Self-pay | Admitting: *Deleted

## 2015-07-12 DIAGNOSIS — J42 Unspecified chronic bronchitis: Secondary | ICD-10-CM | POA: Diagnosis not present

## 2015-07-12 DIAGNOSIS — M1990 Unspecified osteoarthritis, unspecified site: Secondary | ICD-10-CM | POA: Diagnosis not present

## 2015-07-12 DIAGNOSIS — I25119 Atherosclerotic heart disease of native coronary artery with unspecified angina pectoris: Secondary | ICD-10-CM | POA: Diagnosis not present

## 2015-07-12 DIAGNOSIS — E876 Hypokalemia: Secondary | ICD-10-CM | POA: Diagnosis not present

## 2015-07-12 DIAGNOSIS — Z794 Long term (current) use of insulin: Secondary | ICD-10-CM | POA: Insufficient documentation

## 2015-07-12 DIAGNOSIS — Z87891 Personal history of nicotine dependence: Secondary | ICD-10-CM | POA: Insufficient documentation

## 2015-07-12 DIAGNOSIS — Z79899 Other long term (current) drug therapy: Secondary | ICD-10-CM | POA: Diagnosis not present

## 2015-07-12 DIAGNOSIS — Z7982 Long term (current) use of aspirin: Secondary | ICD-10-CM | POA: Insufficient documentation

## 2015-07-12 DIAGNOSIS — F17200 Nicotine dependence, unspecified, uncomplicated: Secondary | ICD-10-CM

## 2015-07-12 DIAGNOSIS — E119 Type 2 diabetes mellitus without complications: Secondary | ICD-10-CM | POA: Insufficient documentation

## 2015-07-12 DIAGNOSIS — E039 Hypothyroidism, unspecified: Secondary | ICD-10-CM | POA: Insufficient documentation

## 2015-07-12 DIAGNOSIS — R05 Cough: Secondary | ICD-10-CM | POA: Diagnosis present

## 2015-07-12 DIAGNOSIS — Z862 Personal history of diseases of the blood and blood-forming organs and certain disorders involving the immune mechanism: Secondary | ICD-10-CM | POA: Diagnosis not present

## 2015-07-12 DIAGNOSIS — Z7951 Long term (current) use of inhaled steroids: Secondary | ICD-10-CM | POA: Diagnosis not present

## 2015-07-12 DIAGNOSIS — I10 Essential (primary) hypertension: Secondary | ICD-10-CM | POA: Insufficient documentation

## 2015-07-12 DIAGNOSIS — Z79890 Hormone replacement therapy (postmenopausal): Secondary | ICD-10-CM | POA: Diagnosis not present

## 2015-07-12 LAB — CBC WITH DIFFERENTIAL/PLATELET
BASOS ABS: 0 10*3/uL (ref 0.0–0.1)
BASOS PCT: 0 %
EOS ABS: 0.2 10*3/uL (ref 0.0–0.7)
EOS PCT: 2 %
HCT: 35.1 % — ABNORMAL LOW (ref 36.0–46.0)
Hemoglobin: 11 g/dL — ABNORMAL LOW (ref 12.0–15.0)
LYMPHS ABS: 1.7 10*3/uL (ref 0.7–4.0)
Lymphocytes Relative: 22 %
MCH: 25.1 pg — AB (ref 26.0–34.0)
MCHC: 31.3 g/dL (ref 30.0–36.0)
MCV: 80 fL (ref 78.0–100.0)
Monocytes Absolute: 0.6 10*3/uL (ref 0.1–1.0)
Monocytes Relative: 8 %
NEUTROS PCT: 68 %
Neutro Abs: 5.2 10*3/uL (ref 1.7–7.7)
PLATELETS: 246 10*3/uL (ref 150–400)
RBC: 4.39 MIL/uL (ref 3.87–5.11)
RDW: 16.4 % — ABNORMAL HIGH (ref 11.5–15.5)
WBC: 7.6 10*3/uL (ref 4.0–10.5)

## 2015-07-12 LAB — BASIC METABOLIC PANEL
Anion gap: 12 (ref 5–15)
BUN: 7 mg/dL (ref 6–20)
CALCIUM: 8.6 mg/dL — AB (ref 8.9–10.3)
CO2: 25 mmol/L (ref 22–32)
CREATININE: 0.91 mg/dL (ref 0.44–1.00)
Chloride: 101 mmol/L (ref 101–111)
Glucose, Bld: 143 mg/dL — ABNORMAL HIGH (ref 65–99)
Potassium: 2.9 mmol/L — ABNORMAL LOW (ref 3.5–5.1)
SODIUM: 138 mmol/L (ref 135–145)

## 2015-07-12 MED ORDER — PREDNISONE 20 MG PO TABS: ORAL_TABLET | ORAL | Status: DC

## 2015-07-12 MED ORDER — IPRATROPIUM-ALBUTEROL 0.5-2.5 (3) MG/3ML IN SOLN
3.0000 mL | Freq: Once | RESPIRATORY_TRACT | Status: AC
  Administered 2015-07-12: 3 mL via RESPIRATORY_TRACT
  Filled 2015-07-12: qty 3

## 2015-07-12 MED ORDER — ALBUTEROL SULFATE (2.5 MG/3ML) 0.083% IN NEBU
2.5000 mg | INHALATION_SOLUTION | Freq: Once | RESPIRATORY_TRACT | Status: AC
  Administered 2015-07-12: 2.5 mg via RESPIRATORY_TRACT
  Filled 2015-07-12: qty 3

## 2015-07-12 MED ORDER — POTASSIUM CHLORIDE CRYS ER 20 MEQ PO TBCR
40.0000 meq | EXTENDED_RELEASE_TABLET | Freq: Once | ORAL | Status: AC
  Administered 2015-07-12: 40 meq via ORAL
  Filled 2015-07-12: qty 2

## 2015-07-12 MED ORDER — IPRATROPIUM BROMIDE 0.02 % IN SOLN: 0.5000 mg | Freq: Four times a day (QID) | RESPIRATORY_TRACT | Status: DC

## 2015-07-12 MED ORDER — PREDNISONE 20 MG PO TABS
40.0000 mg | ORAL_TABLET | Freq: Once | ORAL | Status: AC
  Administered 2015-07-12: 40 mg via ORAL
  Filled 2015-07-12: qty 2

## 2015-07-12 MED ORDER — POTASSIUM CHLORIDE CRYS ER 20 MEQ PO TBCR: 40.0000 meq | EXTENDED_RELEASE_TABLET | Freq: Once | ORAL | Status: DC

## 2015-07-12 NOTE — ED Notes (Signed)
MD at bedside. 

## 2015-07-12 NOTE — ED Notes (Signed)
States cough is strong, having thick sputum, unable to produce sec

## 2015-07-12 NOTE — ED Notes (Signed)
Cough, congestion, states having very thick sec, has had a cough for over a month, noted improvement, but now has has returned

## 2015-07-12 NOTE — ED Provider Notes (Addendum)
CSN: [id number]     Arrival date & time 07/12/15  0714 History   First MD Initiated Contact with Patient 07/12/15 0735     Chief Complaint  Patient presents with  . Cough     (Consider location/radiation/quality/duration/timing/severity/associated sxs/prior Treatment) HPI Patient symptoms started with nasal congestion and sinus pressure on the month ago. To date the patient has had a 7 day course of Augmentin, 5 day course of Zithromax, another 7 days of Augmentin, and now 2 days of Avelox. She has developed a harsh cough productive of thick mucus. She reports she's having difficulty coughing the mucus out. No fever. Patient poor she feels short of breath. No chest pain. Some burning with coughing. With deep inspiration patient poor she gets into coughing episodes that make her feel short of breath. No lower extremity swelling or calf pain. Patient has history of recurrent bronchitis. She saw her pulmonologist 2 days ago and has had 2 doses of Avelox. She reports she got a steroid shot in the office and is on Hydromet for cough suppression. She reports this is not helping and she is getting increasing short of breath and coughing more. Patient does however continue to smoke. She reports some diarrhea associated with the illness but no vomiting. Past Medical History  Diagnosis Date  . Unspecified hypothyroidism   . Cough   . Acute bronchitis   . Obstructive sleep apnea (adult) (pediatric)   . Coronary atherosclerosis   . Anginal pain ([place])   . Hypertension   . Type II or unspecified type diabetes mellitus without mention of complication, not stated as uncontrolled     type 2  . Arthritis     osteo  . Anemia     hx of anemia   Past Surgical History  Procedure Laterality Date  . Tubal ligation    . Anterior fusion cervical spine    . [place] tooth extraction    . Left heart catheterization with coronary angiogram N/A 05/29/2014    Procedure: LEFT HEART CATHETERIZATION WITH CORONARY  ANGIOGRAM;  Surgeon: Sinclair Grooms, MD;  Location: [facility] CATH LAB;  Service: Cardiovascular;  Laterality: N/A;   Family History  Problem Relation Age of Onset  . Hypertension Mother   . Heart disease Mother   . Prostate cancer Father   . Heart disease Brother    Social History  Substance Use Topics  . Smoking status: Former Smoker -- 0.10 packs/day for 30 years    Quit date: 08/09/2003  . Smokeless tobacco: Never Used  . Alcohol Use: No   OB History    No data available     Review of Systems 10 Systems reviewed and are negative for acute change except as noted in the HPI.    Allergies  Codeine; Levaquin; Sulfonamide derivatives; and Sulfa antibiotics  Home Medications   Prior to Admission medications   Medication Sig Start Date End Date Taking? Authorizing Provider  ADVAIR DISKUS 250-50 MCG/DOSE AEPB INHALE ONE (1) PUFF(S) TWICE DAILY, RINSE MOUTH. 04/16/15   Deneise Lever, MD  albuterol (PROVENTIL HFA) 108 (90 BASE) MCG/ACT inhaler Inhale 2 puffs into the lungs every 6 (six) hours as needed for wheezing or shortness of breath. For shortness of breath and wheezing 11/08/11 07/10/15  Deneise Lever, MD  albuterol (PROVENTIL) (2.5 MG/3ML) 0.083% nebulizer solution Take 3 mLs (2.5 mg total) by nebulization every 6 (six) hours as needed for wheezing or shortness of breath. 06/30/15 02/07/18  Deneise Lever, MD  aspirin 81 MG chewable tablet Chew 1 tablet (81 mg total) by mouth daily. 05/30/14   Rhonda G Barrett, PA-C  atorvastatin (LIPITOR) 80 MG tablet Take 80 mg by mouth every evening.     Historical Provider, MD  clopidogrel (PLAVIX) 75 MG tablet Take 1 tablet by mouth daily. 05/01/14   Historical Provider, MD  diazepam (VALIUM) 5 MG tablet Take 0.5 tablets by mouth as needed. .5 to 1 tablet prn for muscle cramps 10/28/11   Historical Provider, MD  diclofenac (VOLTAREN) 75 MG EC tablet Take 75 mg by mouth daily as needed. For muscle aches    Historical Provider, MD   HYDROcodone-homatropine (HYCODAN) 5-1.5 MG/5ML syrup Take 5 mLs by mouth every 6 (six) hours as needed for cough. 07/10/15   Deneise Lever, MD  Insulin Detemir (LEVEMIR FLEXPEN [place]) Inject 15 Units into the skin at bedtime.     Historical Provider, MD  INVOKANA 100 MG TABS Take 100 mg by mouth daily before breakfast.  07/03/13   Historical Provider, MD  ipratropium (ATROVENT) 0.02 % nebulizer solution Take 2.5 mLs (0.5 mg total) by nebulization 4 (four) times daily. 07/12/15   Charlesetta Shanks, MD  isosorbide mononitrate (IMDUR) 120 MG 24 hr tablet Take 1 tablet (120 mg total) by mouth daily. 07/28/14   Troy Sine, MD  levothyroxine (SYNTHROID, LEVOTHROID) 200 MCG tablet Take 200 mcg by mouth daily.    Historical Provider, MD  metFORMIN (GLUMETZA) 1000 MG (MOD) 24 hr tablet Take 1 tablet (1,000 mg total) by mouth 2 (two) times daily with a meal. HOLD for 48 hours, restart on 06/01/2014. 05/30/14   Evelene Croon Barrett, PA-C  metoprolol succinate (TOPROL-XL) 25 MG 24 hr tablet Take 25 mg by mouth daily.      Historical Provider, MD  mometasone-formoterol (DULERA) 200-5 MCG/ACT AERO Inhale 2 puffs into the lungs 2 (two) times daily.    Historical Provider, MD  moxifloxacin (AVELOX) 400 MG tablet Take 1 tablet (400 mg total) by mouth daily. 07/10/15   Deneise Lever, MD  NITROSTAT 0.4 MG SL tablet Place 1 tablet under the tongue every 5 (five) minutes as needed for chest pain.  02/18/14   Historical Provider, MD  NOVOTWIST 32G X 5 MM MISC Use as directed 05/04/11   Historical Provider, MD  omega-3 acid ethyl esters (LOVAZA) 1 G capsule TAKE 2 CAPSULES (2 G TOTAL) BY MOUTH 2 (TWO) TIMES DAILY. 07/09/15   Troy Sine, MD  ONE TOUCH ULTRA TEST test strip Use as directed 05/05/11   Historical Provider, MD  potassium chloride SA (K-DUR,KLOR-CON) 20 MEQ tablet Take 2 tablets (40 mEq total) by mouth once. 07/12/15   Charlesetta Shanks, MD  predniSONE (DELTASONE) 20 MG tablet 3 tabs po day one, then 2 po daily x 4 days  07/12/15   Charlesetta Shanks, MD  triamterene-hydrochlorothiazide (MAXZIDE-25) 37.5-25 MG per tablet Take 1 tablet by mouth daily. 05/29/14   Historical Provider, MD  verapamil (CALAN-SR) 240 MG CR tablet Take 1 tablet by mouth daily. 05/26/14   Historical Provider, MD   BP 153/80 mmHg  Pulse 96  Temp(Src) 99.4 F (37.4 C) (Oral)  Resp 20  Ht 5\' 2"  (1.575 m)  Wt 166 lb (75.297 kg)  BMI 30.35 kg/m2  SpO2 99% Physical Exam  Constitutional: She is oriented to person, place, and time. She appears well-developed and well-nourished.  Patient is alert and nontoxic. Mild increased work of breathing at rest. Mental status is clear. Color is good.  HENT:  Head: Normocephalic and atraumatic.  Eyes: EOM are normal. Pupils are equal, round, and reactive to light.  Neck: Neck supple.  Cardiovascular: Normal rate, regular rhythm, normal heart sounds and intact distal pulses.   Pulmonary/Chest:  Mild increased work of breathing at rest. Diffuse expiratory wheeze throughout lung fields. Harsh cough with deep inspiration.  Abdominal: Soft. Bowel sounds are normal. She exhibits no distension. There is no tenderness.  Musculoskeletal: Normal range of motion. She exhibits no edema or tenderness.  Neurological: She is alert and oriented to person, place, and time. She has normal strength. Coordination normal. GCS eye subscore is 4. GCS verbal subscore is 5. GCS motor subscore is 6.  Skin: Skin is warm, dry and intact.  Psychiatric: She has a normal mood and affect.    ED Course  Procedures (including critical care time) Labs Review Labs Reviewed  BASIC METABOLIC PANEL - Abnormal; Notable for the following:    Potassium 2.9 (*)    Glucose, Bld 143 (*)    Calcium 8.6 (*)    All other components within normal limits  CBC WITH DIFFERENTIAL/PLATELET - Abnormal; Notable for the following:    Hemoglobin 11.0 (*)    HCT 35.1 (*)    MCH 25.1 (*)    RDW 16.4 (*)    All other components within normal limits     Imaging Review Dg Chest 2 View  07/12/2015  CLINICAL DATA:  Chest tightness, congestion, cough for 3 days. History of asthma. EXAM: CHEST  2 VIEW COMPARISON:  Chest x-rays dated 07/10/2015 did 09/02/2013. FINDINGS: Heart size is upper normal, stable. Overall cardiomediastinal silhouette remains within normal limits in size and configuration. Probable scarring again identified within the right middle lobe. Lungs otherwise clear. Lung volumes are normal. No evidence of pneumonia. No pleural effusion seen. No pneumothorax. Anterior cervical fusion hardware appears stable in position. Osseous and soft tissue structures about the chest are otherwise unremarkable. IMPRESSION: Stable chest x-ray. Probable chronic scarring within the right middle lobe. No evidence of acute cardiopulmonary abnormality. Electronically Signed   By: Franki Cabot M.D.   On: 07/12/2015 08:24   Dg Chest 2 View  07/10/2015  CLINICAL DATA:  Cough and congestion. EXAM: CHEST  2 VIEW COMPARISON:  09/02/2013.  10/04/ 2013. FINDINGS: Mediastinum and hilar structures are normal. Lungs are clear. Borderline cardiomegaly. No pulmonary venous congestion. No acute infiltrate. Changes of right middle lobe and low left base pleural-parenchymal scarring . No pleural effusion or pneumothorax. Prior cervical spine fusion. IMPRESSION: 1. Borderline cardiomegaly. 2. No acute pulmonary disease. Bilateral pleural-parenchymal scarring. Electronically Signed   By: Marcello Moores  Register   On: 07/10/2015 14:14   I have personally reviewed and evaluated these images and lab results as part of my medical decision-making.   EKG Interpretation None      MDM   Final diagnoses:  Chronic bronchitis, unspecified chronic bronchitis type (HCC)  Smoking addiction  Hypokalemia    Patient continues to have harsh cough despite multiple treatments for bronchitis with antibiotics. Patient does have chronic bronchitis. At this time I do not feel this is likely to  be infectious in etiology. She did have a steroid injection in her pulmonologist office by her report. At this time with continued wheezing I will have her initiate a burst of prednisone. Patient will be also instructed she can increase her Hydromet to 2 teaspoons as needed. He is to continue her DuoNeb's at home every 4 hours. At this time patient is alert  and nontoxic. She does not show signs of acute respiratory failure. She is not hypoxic on room air and does not show signs of hypercapnia. Patient is instructed to have close follow-up with her pulmonologist this week for recheck. Potassium is low today. Patient does not have associated symptoms. She will be started on a supplemental potassium for the next several days with instructions to follow-up with her physician this week. She is instructed to return if symptoms are worsening. Patient has been counseled on smoking cessation and the difficulty\and possibility of improvement with continued smoke exposure. She is counseled on the possible progression to respiratory failure.    Charlesetta Shanks, MD 07/12/15 [id number]  Charlesetta Shanks, MD 07/12/15 [phone]

## 2015-07-12 NOTE — ED Notes (Signed)
RT at bedside.

## 2015-07-12 NOTE — Discharge Instructions (Signed)
Chronic Bronchitis Chronic bronchitis is a lasting inflammation of the bronchial tubes, which are the tubes that carry air into your lungs. This is inflammation that occurs:   On most days of the week.   For at least three months at a time.   Over a period of two years in a row. When the bronchial tubes are inflamed, they start to produce mucus. The inflammation and buildup of mucus make it more difficult to breathe. Chronic bronchitis is usually a permanent problem and is one type of chronic obstructive pulmonary disease (COPD). People with chronic bronchitis are at greater risk for getting repeated colds, or respiratory infections. CAUSES  Chronic bronchitis most often occurs in people who have:  Long-standing, severe asthma.  A history of smoking.  Asthma and who also smoke. SIGNS AND SYMPTOMS  Chronic bronchitis may cause the following:   A cough that brings up mucus (productive cough).  Shortness of breath.  Early morning headache.  Wheezing.  Chest discomfort.   Recurring respiratory infections. DIAGNOSIS  Your health care provider may confirm the diagnosis by:  Taking your medical history.  Performing a physical exam.  Taking a chest X-ray.   Performing pulmonary function tests. TREATMENT  Treatment involves controlling symptoms with medicines, oxygen therapy, or making lifestyle changes, such as exercising and eating a healthy, well-balanced diet. Medicines could include:  Inhalers to improve air flow in and out of your lungs.  Antibiotics to treat bacterial infections, such as pneumonia, sinus infections, and acute bronchitis. As a preventative measure, your health care provider may recommend routine vaccinations for influenza and pneumonia. This is to prevent infection and hospitalization since you may be more at risk for these types of infections.  HOME CARE INSTRUCTIONS  Take medicines only as directed by your health care provider.   If you smoke  cigarettes, chew tobacco, or use electronic cigarettes, quit. If you need help quitting, ask your health care provider.  Avoid pollen, dust, animal dander, molds, smoke, and other things that cause shortness of breath or wheezing attacks.  Talk to your health care provider about possible exercise routines. Regular exercise is very important to help you feel better.  If you are prescribed oxygen use at home follow these guidelines:  Never smoke while using oxygen. Oxygen does not burn or explode, but flammable materials will burn faster in the presence of oxygen.  Keep a Data processing manager close by. Let your fire department know that you have oxygen in your home.  Warn visitors not to smoke near you when you are using oxygen. Put up "no smoking" signs in your home where you most often use the oxygen.  Regularly test your smoke detectors at home to make sure they work. If you receive care in your home from a nurse or other health care provider, he or she may also check to make sure your smoke detectors work.  Ask your health care provider whether you would benefit from a pulmonary rehabilitation program.  Do not wait to get medical care if you have any concerning symptoms. Delays could cause permanent injury and may be life threatening. SEEK MEDICAL CARE IF:  You have increased coughing or shortness of breath or both.  You have muscle aches.  You have chest pain.  Your mucus gets thicker.  Your mucus changes from clear or white to yellow, green, gray, or bloody. SEEK IMMEDIATE MEDICAL CARE IF:  Your usual medicines do not stop your wheezing.   You have increased difficulty breathing.  You have any problems with the medicine you are taking, such as a rash, itching, swelling, or trouble breathing. MAKE SURE YOU:   Understand these instructions.  Will watch your condition.  Will get help right away if you are not doing well or get worse.   This information is not intended to  replace advice given to you by your health care provider. Make sure you discuss any questions you have with your health care provider.   Document Released: 05/12/2006 Document Revised: 08/15/2014 Document Reviewed: 09/02/2013 Elsevier Interactive Patient Education 2016 [employer]. Smoking Cessation, Tips for Success If you are ready to quit smoking, congratulations! You have chosen to help yourself be healthier. Cigarettes bring nicotine, tar, carbon monoxide, and other irritants into your body. Your lungs, heart, and blood vessels will be able to work better without these poisons. There are many different ways to quit smoking. Nicotine gum, nicotine patches, a nicotine inhaler, or nicotine nasal spray can help with physical craving. Hypnosis, support groups, and medicines help break the habit of smoking. WHAT THINGS CAN I DO TO MAKE QUITTING EASIER?  Here are some tips to help you quit for good:  Pick a date when you will quit smoking completely. Tell all of your friends and family about your plan to quit on that date.  Do not try to slowly cut down on the number of cigarettes you are smoking. Pick a quit date and quit smoking completely starting on that day.  Throw away all cigarettes.   Clean and remove all ashtrays from your home, work, and car.  On a card, write down your reasons for quitting. Carry the card with you and read it when you get the urge to smoke.  Cleanse your body of nicotine. Drink enough water and fluids to keep your urine clear or pale yellow. Do this after quitting to flush the nicotine from your body.  Learn to predict your moods. Do not let a bad situation be your excuse to have a cigarette. Some situations in your life might tempt you into wanting a cigarette.  Never have "just one" cigarette. It leads to wanting another and another. Remind yourself of your decision to quit.  Change habits associated with smoking. If you smoked while driving or when feeling  stressed, try other activities to replace smoking. Stand up when drinking your coffee. Brush your teeth after eating. Sit in a different chair when you read the paper. Avoid alcohol while trying to quit, and try to drink fewer caffeinated beverages. Alcohol and caffeine may urge you to smoke.  Avoid foods and drinks that can trigger a desire to smoke, such as sugary or spicy foods and alcohol.  Ask people who smoke not to smoke around you.  Have something planned to do right after eating or having a cup of coffee. For example, plan to take a walk or exercise.  Try a relaxation exercise to calm you down and decrease your stress. Remember, you may be tense and nervous for the first 2 weeks after you quit, but this will pass.  Find new activities to keep your hands busy. Play with a pen, coin, or rubber band. Doodle or draw things on paper.  Brush your teeth right after eating. This will help cut down on the craving for the taste of tobacco after meals. You can also try mouthwash.   Use oral substitutes in place of cigarettes. Try using lemon drops, carrots, cinnamon sticks, or chewing gum. Keep them handy so  they are available when you have the urge to smoke.  When you have the urge to smoke, try deep breathing.  Designate your home as a nonsmoking area.  If you are a heavy smoker, ask your health care provider about a prescription for nicotine chewing gum. It can ease your withdrawal from nicotine.  Reward yourself. Set aside the cigarette money you save and buy yourself something nice.  Look for support from others. Join a support group or smoking cessation program. Ask someone at home or at work to help you with your plan to quit smoking.  Always ask yourself, "Do I need this cigarette or is this just a reflex?" Tell yourself, "Today, I choose not to smoke," or "I do not want to smoke." You are reminding yourself of your decision to quit.  Do not replace cigarette smoking with  electronic cigarettes (commonly called e-cigarettes). The safety of e-cigarettes is unknown, and some may contain harmful chemicals.  If you relapse, do not give up! Plan ahead and think about what you will do the next time you get the urge to smoke. HOW WILL I FEEL WHEN I QUIT SMOKING? You may have symptoms of withdrawal because your body is used to nicotine (the addictive substance in cigarettes). You may crave cigarettes, be irritable, feel very hungry, cough often, get headaches, or have difficulty concentrating. The withdrawal symptoms are only temporary. They are strongest when you first quit but will go away within 10-14 days. When withdrawal symptoms occur, stay in control. Think about your reasons for quitting. Remind yourself that these are signs that your body is healing and getting used to being without cigarettes. Remember that withdrawal symptoms are easier to treat than the major diseases that smoking can cause.  Even after the withdrawal is over, expect periodic urges to smoke. However, these cravings are generally short lived and will go away whether you smoke or not. Do not smoke! WHAT RESOURCES ARE AVAILABLE TO HELP ME QUIT SMOKING? Your health care provider can direct you to community resources or hospitals for support, which may include:  Group support.  Education.  Hypnosis.  Therapy.   This information is not intended to replace advice given to you by your health care provider. Make sure you discuss any questions you have with your health care provider.   Document Released: 04/22/2004 Document Revised: 08/15/2014 Document Reviewed: 01/10/2013 Elsevier Interactive Patient Education 2016 [employer]. Hypokalemia Hypokalemia means that the amount of potassium in the blood is lower than normal.Potassium is a chemical, called an electrolyte, that helps regulate the amount of fluid in the body. It also stimulates muscle contraction and helps nerves function properly.Most of  the body's potassium is inside of cells, and only a very small amount is in the blood. Because the amount in the blood is so small, minor changes can be life-threatening. CAUSES  Antibiotics.  Diarrhea or vomiting.  Using laxatives too much, which can cause diarrhea.  Chronic kidney disease.  Water pills (diuretics).  Eating disorders (bulimia).  Low magnesium level.  Sweating a lot. SIGNS AND SYMPTOMS  Weakness.  Constipation.  Fatigue.  Muscle cramps.  Mental confusion.  Skipped heartbeats or irregular heartbeat (palpitations).  Tingling or numbness. DIAGNOSIS  Your health care provider can diagnose hypokalemia with blood tests. In addition to checking your potassium level, your health care provider may also check other lab tests. TREATMENT Hypokalemia can be treated with potassium supplements taken by mouth or adjustments in your current medicines. If your potassium  level is very low, you may need to get potassium through a vein (IV) and be monitored in the hospital. A diet high in potassium is also helpful. Foods high in potassium are:  Nuts, such as peanuts and pistachios.  Seeds, such as sunflower seeds and pumpkin seeds.  Peas, lentils, and lima beans.  Whole grain and bran cereals and breads.  Fresh fruit and vegetables, such as apricots, avocado, bananas, cantaloupe, kiwi, oranges, tomatoes, asparagus, and potatoes.  Orange and tomato juices.  Red meats.  Fruit yogurt. HOME CARE INSTRUCTIONS  Take all medicines as prescribed by your health care provider.  Maintain a healthy diet by including nutritious food, such as fruits, vegetables, nuts, whole grains, and lean meats.  If you are taking a laxative, be sure to follow the directions on the label. SEEK MEDICAL CARE IF:  Your weakness gets worse.  You feel your heart pounding or racing.  You are vomiting or having diarrhea.  You are diabetic and having trouble keeping your blood glucose in  the normal range. SEEK IMMEDIATE MEDICAL CARE IF:  You have chest pain, shortness of breath, or dizziness.  You are vomiting or having diarrhea for more than 2 days.  You faint. MAKE SURE YOU:   Understand these instructions.  Will watch your condition.  Will get help right away if you are not doing well or get worse.   This information is not intended to replace advice given to you by your health care provider. Make sure you discuss any questions you have with your health care provider.   Document Released: 07/25/2005 Document Revised: 08/15/2014 Document Reviewed: 01/25/2013 Elsevier Interactive Patient Education [employer].

## 2015-07-12 NOTE — ED Notes (Signed)
Placed on cont POX monitoring

## 2015-07-13 ENCOUNTER — Telehealth: Payer: Self-pay | Admitting: Internal Medicine

## 2015-07-13 NOTE — Telephone Encounter (Signed)
Notes Recorded by Deneise Lever, MD on 07/10/2015 at 3:38 PM CXR-some old scarring but no pneumonia or new process. --  I spoke with patient about results and she verbalized understanding and had no questions. She reports she was in the ED 07/12/15 and told to follow up with Dr. Annamaria Boots this week Pt reports so far she is doing okay. Please advise Dr. Annamaria Boots thanks

## 2015-07-14 NOTE — Telephone Encounter (Signed)
lmtcb x1 for pt. 

## 2015-07-14 NOTE — Telephone Encounter (Signed)
Per Cy-okay to have patient see TP this Thursday 07/16/15 afternoon as he has no openings this week. Thanks.

## 2015-07-14 NOTE — Telephone Encounter (Signed)
Pt could not make appt on Thursday afternoon, but was scheduled next Tuesday 12/13 at 10:30 with TP. Nothing further needed.

## 2015-07-20 ENCOUNTER — Telehealth: Payer: Self-pay | Admitting: Cardiovascular Disease

## 2015-07-20 NOTE — Telephone Encounter (Signed)
Called patient and informed her the form is ready for her to pick up. She states that she will send her sister to pick it up.

## 2015-07-20 NOTE — Telephone Encounter (Signed)
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 Please f/u with her as soon as possible. She says that this paper work time sensitive.   Thanks

## 2015-07-21 ENCOUNTER — Ambulatory Visit (INDEPENDENT_AMBULATORY_CARE_PROVIDER_SITE_OTHER): Payer: 59 | Admitting: Adult Health

## 2015-07-21 ENCOUNTER — Encounter: Payer: Self-pay | Admitting: Adult Health

## 2015-07-21 VITALS — BP 134/70 | HR 71 | Temp 97.9°F | Ht 62.0 in | Wt 164.0 lb

## 2015-07-21 DIAGNOSIS — J209 Acute bronchitis, unspecified: Secondary | ICD-10-CM | POA: Diagnosis not present

## 2015-07-21 MED ORDER — FLUCONAZOLE 100 MG PO TABS: 100.0000 mg | ORAL_TABLET | Freq: Once | ORAL | Status: AC

## 2015-07-21 NOTE — Addendum Note (Signed)
Addended by: Osa Craver on: 07/21/2015 11:32 AM   Modules accepted: Orders, Medications

## 2015-07-21 NOTE — Patient Instructions (Addendum)
Continue on current regimen  Mucinex DM Twice daily  As needed   Diflucan daily for 1 week.  Hold Lipitor for 2 weeks while on Diflucan .  Please contact office for sooner follow up if symptoms do not improve or worsen or seek emergency care  follow up Dr. Annamaria Boots  In 6 months and As needed

## 2015-07-21 NOTE — Assessment & Plan Note (Addendum)
Recent slow to resolve asthmatic bronchitis and sinusitis , improved with steroids and antibiotics  Plan  Continue on current regimen  Mucinex DM Twice daily  As needed   Diflucan daily for 1 week. -pt requested for vaginal yeast infection - follow up with GYN is not resolved.  Hold Lipitor for 2 weeks while on Diflucan .  Please contact office for sooner follow up if symptoms do not improve or worsen or seek emergency care  follow up Dr. Annamaria Boots  In 6 months and As needed

## 2015-07-21 NOTE — Progress Notes (Signed)
Subjective:    Patient ID: [name], female    DOB: 03-Feb-1956, 59 y.o.   MRN: [id number]  HPI 59 yo female former smoker followed for obstructive sleep apnea, recurrent bronchitis, allergic rhinitis  07/21/2015 ER follow up : Bronchitis Patient returns for a follow-up for recent emergency room visit. Patient was recently seen in the office on December 2 with an acute sinusitis and bronchitis flare He was treated with 10 days of Avelox and a Medrol injection. She says, that her symptoms worsen and she ended up in the ER on December 4. Chest x-ray showed no acute process.Marland Kitchen She was treated with a prednisone taper and a nebulizer treatment. Patient is starting to feel better. She has finished her antibody can prednisone taper. Denies any fever, hemoptysis, orthopnea, PND, or increased leg swelling. Remains on Advair twice daily Patient complains of a yeast infection. Request a prescription for Diflucan.  Past Medical History  Diagnosis Date  . Unspecified hypothyroidism   . Cough   . Acute bronchitis   . Obstructive sleep apnea (adult) (pediatric)   . Coronary atherosclerosis   . Anginal pain ([place])   . Hypertension   . Type II or unspecified type diabetes mellitus without mention of complication, not stated as uncontrolled     type 2  . Arthritis     osteo  . Anemia     hx of anemia      Review of Systems Constitutional:   No  weight loss, night sweats,  Fevers, chills, + fatigue, or  lassitude.  HEENT:   No headaches,  Difficulty swallowing,  Tooth/dental problems, or  Sore throat,                No sneezing, itching, ear ache,  +nasal congestion, post nasal drip,   CV:  No chest pain,  Orthopnea, PND, swelling in lower extremities, anasarca, dizziness, palpitations, syncope.   GI  No heartburn, indigestion, abdominal pain, nausea, vomiting, diarrhea, change in bowel habits, loss of appetite, bloody stools.   Resp:    No chest wall deformity  Skin: no rash  or lesions.  GU: no dysuria, change in color of urine, no urgency or frequency.  No flank pain, no hematuria   MS:  No joint pain or swelling.  No decreased range of motion.  No back pain.  Psych:  No change in mood or affect. No depression or anxiety.  No memory loss.         Objective:   Physical Exam  Filed Vitals:   07/21/15 1020  BP: 134/70  Pulse: 71  Temp: 97.9 F (36.6 C)  TempSrc: Oral  Height: 5\' 2"  (1.575 m)  Weight: 164 lb (74.39 kg)  SpO2: 97%   GEN: A/Ox3; pleasant , NAD   HEENT:  [place]/AT,  EACs-clear, TMs-wnl, NOSE-clear, THROAT-clear, no lesions, no postnasal drip or exudate noted.   NECK:  Supple w/ fair ROM; no JVD; normal carotid impulses w/o bruits; no thyromegaly or nodules palpated; no lymphadenopathy.  RESP  Few trace rhonchi , no accessory muscle use, no dullness to percussion  CARD:  RRR, no m/r/g  , no peripheral edema, pulses intact, no cyanosis or clubbing.  GI:   Soft & nt; nml bowel sounds; no organomegaly or masses detected.  Musco: Warm bil, no deformities or joint swelling noted.   Neuro: alert, no focal deficits noted.    Skin: Warm, no lesions or rashes       Assessment & Plan:

## 2015-07-31 ENCOUNTER — Telehealth: Payer: Self-pay | Admitting: Internal Medicine

## 2015-07-31 MED ORDER — AMOXICILLIN-POT CLAVULANATE 875-125 MG PO TABS: 1.0000 | ORAL_TABLET | Freq: Two times a day (BID) | ORAL | Status: DC

## 2015-07-31 NOTE — Telephone Encounter (Signed)
Spoke with pt. States that she has an URI. Reports severe sinus congestion. When mucus is produced it's dark green. Denies SOB, chest tightness, wheezing or coughing. Advised her that typically this would need to be addressed by her PCP since she is not having any pulmonary symptoms. She would like still like for CY to address this message.  Allergies  Allergen Reactions  . Codeine     REACTION: hallucinations/"loopy"  . Levaquin [Levofloxacin] Nausea And Vomiting  . Sulfonamide Derivatives Hives  . Sulfa Antibiotics Hives   Current Outpatient Prescriptions on File Prior to Visit  Medication Sig Dispense Refill  . ADVAIR DISKUS 250-50 MCG/DOSE AEPB INHALE ONE (1) PUFF(S) TWICE DAILY, RINSE MOUTH. 60 each 6  . albuterol (PROVENTIL HFA) 108 (90 BASE) MCG/ACT inhaler Inhale 2 puffs into the lungs every 6 (six) hours as needed for wheezing or shortness of breath. For shortness of breath and wheezing 1 Inhaler prn  . albuterol (PROVENTIL) (2.5 MG/3ML) 0.083% nebulizer solution Take 3 mLs (2.5 mg total) by nebulization every 6 (six) hours as needed for wheezing or shortness of breath. 75 mL 5  . aspirin 81 MG chewable tablet Chew 1 tablet (81 mg total) by mouth daily.    Marland Kitchen atorvastatin (LIPITOR) 80 MG tablet Take 1 tablet Monday, Wednesday, and Friday    . clopidogrel (PLAVIX) 75 MG tablet Take 1 tablet by mouth daily.    . diazepam (VALIUM) 5 MG tablet Take 0.5 tablets by mouth as needed. .5 to 1 tablet prn for muscle cramps    . diclofenac (VOLTAREN) 75 MG EC tablet Take 75 mg by mouth daily as needed. For muscle aches    . fluconazole (DIFLUCAN) 100 MG tablet Take 1 tablet (100 mg total) by mouth once. 7 tablet 0  . HYDROcodone-homatropine (HYCODAN) 5-1.5 MG/5ML syrup Take 5 mLs by mouth every 6 (six) hours as needed for cough. 240 mL 0  . Insulin Detemir (LEVEMIR FLEXPEN Waves) Inject 15 Units into the skin at bedtime.     . INVOKANA 100 MG TABS Take 100 mg by mouth daily before breakfast.      . ipratropium (ATROVENT) 0.02 % nebulizer solution Take 2.5 mLs (0.5 mg total) by nebulization 4 (four) times daily. 75 mL 12  . isosorbide mononitrate (IMDUR) 120 MG 24 hr tablet Take 1 tablet (120 mg total) by mouth daily. 30 tablet 9  . levothyroxine (SYNTHROID, LEVOTHROID) 200 MCG tablet Take 200 mcg by mouth daily.    . metFORMIN (GLUMETZA) 1000 MG (MOD) 24 hr tablet Take 1 tablet (1,000 mg total) by mouth 2 (two) times daily with a meal. HOLD for 48 hours, restart on 06/01/2014.    . metoprolol succinate (TOPROL-XL) 25 MG 24 hr tablet Take 25 mg by mouth daily.      Marland Kitchen NITROSTAT 0.4 MG SL tablet Place 1 tablet under the tongue every 5 (five) minutes as needed for chest pain.     Marland Kitchen NOVOTWIST 32G X 5 MM MISC Use as directed    . omega-3 acid ethyl esters (LOVAZA) 1 G capsule TAKE 2 CAPSULES (2 G TOTAL) BY MOUTH 2 (TWO) TIMES DAILY. 120 capsule 0  . ONE TOUCH ULTRA TEST test strip Use as directed    . potassium chloride SA (K-DUR,KLOR-CON) 20 MEQ tablet Take 2 tablets (40 mEq total) by mouth once. (Patient not taking: Reported on 07/21/2015) 7 tablet 0  . triamterene-hydrochlorothiazide (MAXZIDE-25) 37.5-25 MG per tablet Take 1 tablet by mouth daily.    Marland Kitchen  verapamil (CALAN-SR) 240 MG CR tablet Take 1 tablet by mouth daily.     No current facility-administered medications on file prior to visit.   CY - please advise.  Thanks.

## 2015-07-31 NOTE — Telephone Encounter (Signed)
Offer augmentin 875 mg, # 14, 1 twice daily  Mucinex may also help

## 2015-07-31 NOTE — Telephone Encounter (Signed)
Patient notified of CY's recommendations. Rx sent to pharmacy. Nothing further needed.    amoxicillin-clavulanate (AUGMENTIN) 875-125 MG tablet 14 tablet 0 07/31/2015       Sig - Route: Take 1 tablet by mouth 2 (two) times daily. - Oral     E-Prescribing Status: Receipt confirmed by pharmacy (07/31/2015 3:37 PM EST)

## 2015-08-13 ENCOUNTER — Telehealth: Payer: Self-pay | Admitting: Cardiovascular Disease

## 2015-08-13 MED ORDER — OMEGA-3-ACID ETHYL ESTERS 1 G PO CAPS: 2.0000 g | ORAL_CAPSULE | Freq: Two times a day (BID) | ORAL | Status: DC

## 2015-08-13 MED ORDER — ISOSORBIDE MONONITRATE ER 120 MG PO TB24: 120.0000 mg | ORAL_TABLET | Freq: Every day | ORAL | Status: DC

## 2015-08-13 NOTE — Telephone Encounter (Signed)
°*  STAT* If patient is at the pharmacy, call can be transferred to refill team.   1. Which medications need to be refilled? (please list name of each medication and dose if known) Imdur 120mg , Lovaza 1gm capsule( Generic)    2. Which pharmacy/location (including street and city if local pharmacy) is medication to be sent to?[facility] [address] in [place] .  3. Do they need a 30 day or 90 day supply? [place]

## 2015-08-13 NOTE — Telephone Encounter (Signed)
Rx(s) sent to pharmacy electronically.  

## 2015-09-10 NOTE — Progress Notes (Signed)
Cardiology Office Note   Date:  09/11/2015   ID:  [name], DOB 07-16-1956, MRN [id number]  PCP:  Denise Stack, MD  Cardiologist:   Dr. Claiborne [name]   Medication refills    History of Present Illness: [name] is a 60 y.o. female with a history of CAD s/p DES to pRCA (2007), OSA, HTN , DMT2, hypothyroidism, GERD, asthma and intermittent LE edema who presents to clinic for cardiology follow up and medication refills.   [name] has known coronary artery disease and in 2007 underwent insertion of a 3.0x13 mm Cypher DES stent to her proximal RCA. She did have concomitant CAD with 60-70% stenosis of her LAD and 20% circumflex stenosis. In July 2010 she develop recurrent chest pain and repeat catheterization showed 60% LAD stenosis, 20-30% circumflex stenosis and a widely patent RCA stent.  She saw Dr. Claiborne [name] on 05/06/14 for chest pain. He ordered a nuclear stress test.   She was last seen by Dr. Stanford [name] on 05/29/14 (as a work in for Dr. Claiborne [name]) for evaluation after a nuclear stress test showed severe ischemia. Preliminary results revealed severe ischemia in the mid and distal anterior wall and apex. There was also possible transient ischemic dilatation and worrisome ECG changes.  She was admitted for unstable angina and coronary angiography. LHC on 05/29/14 showed RCA stent was patent and she had moderate LAD disease. FFR was performed on the LAD but was not significantly abnormal at 0.94. Therefore, PCI was not indicated. She was continued on medical therapy.   She has not been seen in cardiology follow up since that time.   Today she presents to clinic for follow up and medication refills. She has no chest pain. She has had a little SOB but this is due to a couple URIs since last October. She is going to see [name] about the recurrent infections. No LE edema, PND, orthopnea. No blood in her stool in her urine. She would like to come off the Imdur since she is having no  problems.    Past Medical History  Diagnosis Date  . Unspecified hypothyroidism   . Cough   . Acute bronchitis   . Obstructive sleep apnea (adult) (pediatric)   . Coronary atherosclerosis   . Anginal pain ([name])   . Hypertension   . Type II or unspecified type diabetes mellitus without mention of complication, not stated as uncontrolled     type 2  . Arthritis     osteo  . Anemia     hx of anemia    Past Surgical History  Procedure Laterality Date  . Tubal ligation    . Anterior fusion cervical spine    . Wisdom tooth extraction    . Left heart catheterization with coronary angiogram N/A 05/29/2014    Procedure: LEFT HEART CATHETERIZATION WITH CORONARY ANGIOGRAM;  Surgeon: Denise Grooms, MD;  Location: [facility] CATH LAB;  Service: Cardiovascular;  Laterality: N/A;     Current Outpatient Prescriptions  Medication Sig Dispense Refill  . ADVAIR DISKUS 250-50 MCG/DOSE AEPB INHALE ONE (1) PUFF(S) TWICE DAILY, RINSE MOUTH. 60 each 6  . albuterol (PROVENTIL HFA) 108 (90 BASE) MCG/ACT inhaler Inhale 2 puffs into the lungs every 6 (six) hours as needed for wheezing or shortness of breath. For shortness of breath and wheezing 1 Inhaler prn  . albuterol (PROVENTIL) (2.5 MG/3ML) 0.083% nebulizer solution Take 3 mLs (2.5 mg total) by nebulization every 6 (six) hours as needed for  wheezing or shortness of breath. 75 mL 5  . amoxicillin-clavulanate (AUGMENTIN) 875-125 MG tablet Take 1 tablet by mouth 2 (two) times daily. 14 tablet 0  . aspirin 81 MG chewable tablet Chew 1 tablet (81 mg total) by mouth daily.    Marland Kitchen atorvastatin (LIPITOR) 80 MG tablet Take 1 tablet Monday, Wednesday, and Friday    . clopidogrel (PLAVIX) 75 MG tablet Take 1 tablet by mouth daily.    . diazepam (VALIUM) 5 MG tablet Take 0.5 tablets by mouth as needed. .5 to 1 tablet prn for muscle cramps    . diclofenac (VOLTAREN) 75 MG EC tablet Take 75 mg by mouth daily as needed. For muscle aches    . HYDROcodone-homatropine  (HYCODAN) 5-1.5 MG/5ML syrup Take 5 mLs by mouth every 6 (six) hours as needed for cough. 240 mL 0  . Insulin Detemir (LEVEMIR FLEXPEN Ulen) Inject 15 Units into the skin at bedtime.     . INVOKANA 100 MG TABS Take 100 mg by mouth daily before breakfast.     . ipratropium (ATROVENT) 0.02 % nebulizer solution Take 2.5 mLs (0.5 mg total) by nebulization 4 (four) times daily. 75 mL 12  . isosorbide mononitrate (IMDUR) 120 MG 24 hr tablet Take 1 tablet (120 mg total) by mouth daily. NEEDS APPOINTMENT FOR FUTURE REFILLS OR 90-DAY SUPPLY 30 tablet 0  . levothyroxine (SYNTHROID, LEVOTHROID) 200 MCG tablet Take 200 mcg by mouth daily.    . metFORMIN (GLUMETZA) 1000 MG (MOD) 24 hr tablet Take 1 tablet (1,000 mg total) by mouth 2 (two) times daily with a meal. HOLD for 48 hours, restart on 06/01/2014.    . metoprolol succinate (TOPROL-XL) 25 MG 24 hr tablet Take 25 mg by mouth daily.      Marland Kitchen NITROSTAT 0.4 MG SL tablet Place 1 tablet under the tongue every 5 (five) minutes as needed for chest pain.     Marland Kitchen NOVOTWIST 32G X 5 MM MISC Use as directed    . omega-3 acid ethyl esters (LOVAZA) 1 g capsule Take 2 capsules (2 g total) by mouth 2 (two) times daily. NEEDS APPOINTMENT FOR FUTURE REFILLS OR 90-DAY SUPPLY 120 capsule 0  . ONE TOUCH ULTRA TEST test strip Use as directed    . potassium chloride SA (K-DUR,KLOR-CON) 20 MEQ tablet Take 2 tablets (40 mEq total) by mouth once. 7 tablet 0  . triamterene-hydrochlorothiazide (MAXZIDE-25) 37.5-25 MG per tablet Take 1 tablet by mouth daily.    . verapamil (CALAN-SR) 240 MG CR tablet Take 1 tablet by mouth daily.     No current facility-administered medications for this visit.    Allergies:   Codeine; Levaquin; Sulfonamide derivatives; and Sulfa antibiotics    Social History:  The patient  reports that she quit smoking about 12 years ago. She has never used smokeless tobacco. She reports that she does not drink alcohol or use illicit drugs.   Family History:  The  patient's family history includes Heart attack in her sister; Heart disease in her brother and mother; Hypertension in her mother; Prostate cancer in her father; Stroke in her mother.    ROS:  Please see the history of present illness.   Otherwise, review of systems are positive for none.   All other systems are reviewed and negative.    PHYSICAL EXAM: VS:  BP 130/70 mmHg  Pulse 68  Ht 5\' 2"  (1.575 m)  Wt 169 lb 6.4 oz (76.839 kg)  BMI 30.98 kg/m2 , BMI Body mass  index is 30.98 kg/(m^2). GEN: Well nourished, well developed, in no acute distress HEENT: normal Neck: no JVD, carotid bruits, or masses Cardiac: RRR; no murmurs, rubs, or gallops,no edema  Respiratory:  clear to auscultation bilaterally, normal work of breathing GI: soft, nontender, nondistended, + BS MS: no deformity or atrophy Skin: warm and dry, no rash Neuro:  Strength and sensation are intact Psych: euthymic mood, full affect   EKG:  EKG is ordered today. The ekg ordered today demonstrates NSR HR 64. Some non specific ST changes similar to previous.    Recent Labs: 07/12/2015: BUN 7; Creatinine, Ser 0.91; Hemoglobin 11.0*; Platelets 246; Potassium 2.9*; Sodium 138    Lipid Panel    Component Value Date/Time   CHOL 108 05/30/2014 0400   TRIG 155* 05/30/2014 0400   HDL 40 05/30/2014 0400   CHOLHDL 2.7 05/30/2014 0400   VLDL 31 05/30/2014 0400   LDLCALC 37 05/30/2014 0400      Wt Readings from Last 3 Encounters:  09/11/15 169 lb 6.4 oz (76.839 kg)  07/21/15 164 lb (74.39 kg)  07/12/15 166 lb (75.297 kg)      Other studies Reviewed: Additional studies/ records that were reviewed today include: LHC. Review of the above records demonstrates:    Cardiac Cath: 05/29/2014  HEMODYNAMICS: Aortic pressure 159/56 mmHg; LV pressure 159/9 mmHg; LVEDP 18 mm mercury  ANGIOGRAPHIC DATA: The left main coronary artery is widely patent.  The left anterior descending artery is moderately calcified in the proximal  and mid segment there is an eccentric shelflike region of narrowing extending from the first of perforator to the mid vessel. This is similar to prior angiographic appearance with 50% narrowing. Scattered irregularities are noted further distally and near the apex. The first diagonal contains eccentric 70% narrowing. The territory supplied a small..  The left circumflex artery is a large vessel. It gives origin to 3 obtuse marginal branches. Eccentric narrowing is noted in the circumflex beyond the first obtuse marginal that obstructs the vessel by up to 30%. Further distally beyond the origin of the second obtuse marginal there is 40% narrowing. The proximal portion of the second obtuse marginal contains 30-40% narrowing..  The right coronary artery is widely patent including the stent that is present in the proximal segment. Small PDA arises.  PCI RESULTS: As described above, PCI was not performed. FFR of the proximal to mid segmental LAD stenosis was 0.94.  LEFT VENTRICULOGRAM: Left ventricular angiogram was done in the 30 RAO projection and revealed a normal cavity size with estimated ejection fraction of 60%.  IMPRESSIONS: 1. Widely patent right coronary stent  2. Segmental 50% proximal to mid LAD stenosis with FFR of 0.94. The angiographic appearance is unchanged from 2010.  3. False positive myocardial perfusion study  4. Normal left ventricular function  5. Irregularities noted in the circumflex  RECOMMENDATION: Continued medical therapy, which include aggressive risk factor modification. There is no indication for intervention on the LAD which supplies a territory that caused the appearance of ischemia on nuclear scintigraphy. It would appear this time that the study was false positive. Other explanations might include vasovagal reactive blood flow disturbance during the procedure with coronary spasm being induced. In the cath lab adenosine perfusion did not demonstrate evidence of  hemodynamic significance in the proximal to mid LAD stenosis.     ASSESSMENT AND PLAN:  [name] is a 60 y.o. female with a history of CAD s/p DES to pRCA (2007), OSA, HTN , DMT2,  hypothyroidism, GERD, asthma and intermittent LE edema who presents to clinic for cardiology follow up and medication refills.   CAD: stable. Continue ASA/plavix, atorvastatin 80mg , and Toprol XL 25mg  daily.   HTN: BP 130/70 today. Continue Toprol XL 25mg  daily, Maxide 37.5-25mg , verapamil SR 240mg  daily and imdur 120mg  daily. She does not want to take the imdur anymore since she has no chest pain. She is on a big dose of this and I am afraid her BP will go up. I have told her to go down to half and see what her BP does. She can slowly titrate off if her BP stays stable. However, if it becomes too elevated we will have to add another BP med ( cant go up on BB or dilt due to bradycardia), so she might as well take the imdur.   HLD: continue HD statin and fish oil. Last lipid panel with LDL 37. Followed by PCP. She will have this sent to Dr. Claiborne [name].   Hypothyroidism: continue synthroid  DMT2: continue current regimen   Current medicines are reviewed at length with the patient today.  The patient has concerns regarding medicines.  The following changes have been made:  no change except she will try to go down on the imdur. I have a feeling she will need to stay on some dose of this.   Labs/ tests ordered today include:   Orders Placed This Encounter  Procedures  . EKG 12-Lead     Disposition:   FU with Dr. Claiborne [name] in 1 year.  Renea Ee  09/11/2015 11:26 AM    [facility] [address], [place], [place]  [address] Phone: [phone]; Fax: [phone]

## 2015-09-11 ENCOUNTER — Other Ambulatory Visit: Payer: Self-pay | Admitting: *Deleted

## 2015-09-11 ENCOUNTER — Encounter: Payer: Self-pay | Admitting: Physician Assistant

## 2015-09-11 ENCOUNTER — Ambulatory Visit (INDEPENDENT_AMBULATORY_CARE_PROVIDER_SITE_OTHER): Payer: 59 | Admitting: Physician Assistant

## 2015-09-11 VITALS — BP 130/70 | HR 68 | Ht 62.0 in | Wt 169.4 lb

## 2015-09-11 DIAGNOSIS — I10 Essential (primary) hypertension: Secondary | ICD-10-CM

## 2015-09-11 DIAGNOSIS — R0602 Shortness of breath: Secondary | ICD-10-CM

## 2015-09-11 DIAGNOSIS — R0789 Other chest pain: Secondary | ICD-10-CM

## 2015-09-11 DIAGNOSIS — I25119 Atherosclerotic heart disease of native coronary artery with unspecified angina pectoris: Secondary | ICD-10-CM | POA: Diagnosis not present

## 2015-09-11 DIAGNOSIS — R079 Chest pain, unspecified: Secondary | ICD-10-CM | POA: Diagnosis not present

## 2015-09-11 DIAGNOSIS — I200 Unstable angina: Secondary | ICD-10-CM

## 2015-09-11 MED ORDER — CLOPIDOGREL BISULFATE 75 MG PO TABS: 75.0000 mg | ORAL_TABLET | Freq: Every day | ORAL | Status: DC

## 2015-09-11 MED ORDER — ATORVASTATIN CALCIUM 80 MG PO TABS: ORAL_TABLET | ORAL | Status: DC

## 2015-09-11 MED ORDER — POTASSIUM CHLORIDE CRYS ER 20 MEQ PO TBCR: 40.0000 meq | EXTENDED_RELEASE_TABLET | Freq: Once | ORAL | Status: DC

## 2015-09-11 MED ORDER — METOPROLOL SUCCINATE ER 25 MG PO TB24: 25.0000 mg | ORAL_TABLET | Freq: Every day | ORAL | Status: DC

## 2015-09-11 MED ORDER — ISOSORBIDE MONONITRATE ER 120 MG PO TB24: 120.0000 mg | ORAL_TABLET | Freq: Every day | ORAL | Status: DC

## 2015-09-11 NOTE — Patient Instructions (Addendum)
Medication Instructions:  Your physician recommends that you continue on your current medications as directed. Please refer to the Current Medication list given to you today.  Labwork: None ordered  Testing/Procedures: None ordered  Follow-Up: Your physician wants you to follow-up in: [place].  You will receive a reminder letter in the mail two months in advance. If you don't receive a letter, please call our office to schedule the follow-up appointment.    Any Other Special Instructions Will Be Listed Below (If Applicable).     If you need a refill on your cardiac medications before your next appointment, please call your pharmacy.

## 2015-09-14 ENCOUNTER — Ambulatory Visit (INDEPENDENT_AMBULATORY_CARE_PROVIDER_SITE_OTHER): Payer: 59 | Admitting: Internal Medicine

## 2015-09-14 ENCOUNTER — Encounter: Payer: Self-pay | Admitting: Internal Medicine

## 2015-09-14 ENCOUNTER — Other Ambulatory Visit (INDEPENDENT_AMBULATORY_CARE_PROVIDER_SITE_OTHER): Payer: 59

## 2015-09-14 VITALS — BP 132/74 | HR 87 | Ht 62.0 in | Wt 169.4 lb

## 2015-09-14 DIAGNOSIS — J019 Acute sinusitis, unspecified: Secondary | ICD-10-CM | POA: Diagnosis not present

## 2015-09-14 DIAGNOSIS — Z23 Encounter for immunization: Secondary | ICD-10-CM | POA: Diagnosis not present

## 2015-09-14 DIAGNOSIS — G4733 Obstructive sleep apnea (adult) (pediatric): Secondary | ICD-10-CM | POA: Diagnosis not present

## 2015-09-14 DIAGNOSIS — J209 Acute bronchitis, unspecified: Secondary | ICD-10-CM | POA: Diagnosis not present

## 2015-09-14 LAB — CBC WITH DIFFERENTIAL/PLATELET
BASOS ABS: 0 10*3/uL (ref 0.0–0.1)
Basophils Relative: 0.4 % (ref 0.0–3.0)
EOS ABS: 0.5 10*3/uL (ref 0.0–0.7)
Eosinophils Relative: 5.1 % — ABNORMAL HIGH (ref 0.0–5.0)
HCT: 40.5 % (ref 36.0–46.0)
Hemoglobin: 12.6 g/dL (ref 12.0–15.0)
LYMPHS ABS: 2.8 10*3/uL (ref 0.7–4.0)
LYMPHS PCT: 26.4 % (ref 12.0–46.0)
MCHC: 31.1 g/dL (ref 30.0–36.0)
MCV: 78.9 fl (ref 78.0–100.0)
MONOS PCT: 7 % (ref 3.0–12.0)
Monocytes Absolute: 0.7 10*3/uL (ref 0.1–1.0)
NEUTROS PCT: 61.1 % (ref 43.0–77.0)
Neutro Abs: 6.5 10*3/uL (ref 1.4–7.7)
PLATELETS: 308 10*3/uL (ref 150.0–400.0)
RBC: 5.14 Mil/uL — AB (ref 3.87–5.11)
RDW: 15.5 % (ref 11.5–15.5)
WBC: 10.6 10*3/uL — ABNORMAL HIGH (ref 4.0–10.5)

## 2015-09-14 MED ORDER — HYDROCODONE-HOMATROPINE 5-1.5 MG/5ML PO SYRP: 5.0000 mL | ORAL_SOLUTION | Freq: Four times a day (QID) | ORAL | Status: DC | PRN

## 2015-09-14 MED ORDER — AMOXICILLIN-POT CLAVULANATE 875-125 MG PO TABS: 1.0000 | ORAL_TABLET | Freq: Two times a day (BID) | ORAL | Status: DC

## 2015-09-14 MED ORDER — FLUCONAZOLE 150 MG PO TABS: 150.0000 mg | ORAL_TABLET | Freq: Every day | ORAL | Status: DC

## 2015-09-14 NOTE — Progress Notes (Signed)
Patient ID: [name], female    DOB: 07-Jan-1956, 60 y.o.   MRN: [id number]  HPI 11/08/10- 74 yo F former smoker, followed here for obstructive sleep apnea and for hx bronchitis.  She was recently treated by her PCP for acute bronchitis. Onset after mowing grass. Still coughing up thick greenish yellow mucus after augmentin they gave her. She thinks she does better with doxycycline. Denies fever, swollen glands, sore throat.  Last seen here May 10, 2010. Hx of allergy testing but never on vaccine. Pollen is bothering with nasal congestion and wheezey cough more this year.  Continues CPAP at 13 cwp all night every night.  Sleeps ok. She asks about alternatives and we discussed palatal surgeries, oral appliances and Provent nasal valves. [name] Twin sister had MI.  05/10/11- 64 yo F former smoker, followed here for obstructive sleep apnea and for hx bronchitis, allergic rhinitis, complicated by DM, hypothyroid, CAD  Since last here, Dr. Claiborne Billings placed a coronary stent. 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  CPAP compliance is good all night every night. She isn't sure about mask and we discussed going to [facility] to look at alternatives.  Wants flu vac. She still feels tight in the chest that she continues Advair 250. Some wheeze. Uses rescue inhaler and asks about getting a nebulizer machine. We had called in a Z-Pak which she says was not strong enough. She denies discolored sputum or fever. We discussed using cortisone and anticipated impact on her insulin-dependent diabetes temporarily. She complains of a "raw" substernal sensation but thinks Nexium controls her reflux well.  08/29/11-  41 yo F former smoker, followed here for obstructive sleep apnea and for hx bronchitis, allergic rhinitis, complicated by DM, hypothyroid, CAD  Acute visit-  deep cough-yellow in color and streaks of blood at times; SOB and wheezing-went to ER Saturday([facility])Lower  back pain  she did have flu vaccine. Last week had GI upset with vomiting and right upper quadrant pain. She still has her gallbladder. That discomfort has eased off. 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 As her GI upset resolved, she developed chest tightness with cough and cold symptoms, fever and chills 3 days ago now resolved. Head and chest are still congested. [facility] his nebulizer treatment x2 started prednisone taper. She  Is finishing doxycycline with 2 days left. CHEST - 2 VIEW 08/27/11-  Comparison: 05/10/2011  Findings: Mild peribronchial thickening. Heart and mediastinal  contours are within normal limits. No focal opacities or  effusions. No acute bony abnormality.  IMPRESSION:  Mild bronchitic changes.  Original Report Authenticated By: [name], M.D.   11/08/11- 50 yo F former smoker, followed here for obstructive sleep apnea and for hx bronchitis, allergic rhinitis, complicated by DM, hypothyroid, CAD  Continues using CPAP 13 almost every night. No sleeping pills. Nebulizer treatment and Depo-Medrol did help her breathing last visit. She would like a home nebulizer which we discussed. Singulair did not help. Blames her cough on postnasal drip or getting too hot. Has history of occasional thrush which might also contribute to throat discomfort.   05/11/2012 Acute OV  Complains of head congestion w/ green drainage, PND, prod cough with gree mucus, hoarseness, increased SOB, wheezing, some tightness x2 weeks - denies f/c/s.  has finished abx and pred thru PCP w/in last 2 weeks Took keflex and steroid taper with minimal  improvement. Has sinus congestion, ear fullness, hoarseness and drainage.  No hemotpysis or chest pain  No edema.  OTC not helping with cough.   06/29/12- 65 yo F former smoker, followed here for obstructive sleep apnea and for hx bronchitis, allergic rhinitis, complicated by DM, hypothyroid,  CAD  FOLLOWS FOR: still having cough and feels fullness in chest coming back since seeign TP.; discomfort on right side lung area CPAP 13 Feeling better after she was seen by the nurse practitioner October 4. Now has caught a new cold. Sneezing. Hard cough caused sharp pain right lateral rib. Area still hurts to cough. Scant phlegm, mild sore throat, no fever but did have some chills originally. Took Augmentin then amoxicillin used for dental prophylaxis. CXR 05/11/12-reviewed IMPRESSION:  No acute cardiopulmonary abnormality.  Original Report Authenticated By: [name], M.D.   11/06/12- 31 yo F former smoker, followed here for obstructive sleep apnea and for hx bronchitis, allergic rhinitis, complicated by DM, hypothyroid, CAD , OSA FOLLOW FOR:c/o sinus congestion,chest tightness x 4 days,mild sob w exertion,,cough and nasal congestion yellow,,right ear stopped up and hurting,Finished mouthwash that was called in 2 wks. Ago Eyes itch, postnasal drip, malaise x 4 days. Amoxacillin 2 weeks ago for ?strep throat. Diazepam for cramps.  CPAP 13/ [facility].   06/18/13- 37 yo F former smoker, followed here for obstructive sleep apnea and for hx bronchitis, allergic rhinitis, complicated by DM, hypothyroid, CAD , OSA FOLLOWS FOR: Breathing is slightly improved. Reports SOB and wheezing. Denies chest tightness today. Is producing green mucus from nose. Denies fever or sore throat. Using Mucinex and saline rinse. Had flu vaccine. CPAP 13/ [facility] is not being used as much as she should. Using Nexium, feels no reflux or heartburn  01/23/14- 54 yo F former smoker, followed here for obstructive sleep apnea and for hx bronchitis, allergic rhinitis, complicated by DM, hypothyroid, CAD , OSA FOLLOWS FOR:  Having increased sob with exertion, wheezing and cough with yellow mucus x1 month.  Pt has done round of anitbiotics and pred shot twice. CPAP/13/[facility] Onset of sinusitis in March with extension to  chest/bronchitis. Urgent care treated with steroid shot, antibiotic shot and then cephalosporin. She started itching attributed to the antibiotic. Chest x-ray was clear. She was given repeat steroid and antibiotic injections. Still blowing green from her nose. Using Sudafed and saline nasal rinse.no fever or headache recognized CXR 09/02/13 IMPRESSION:  No acute disease.  Electronically Signed  By: Inge Rise M.D.  On: 09/02/2013 20:28  08/04/14- 73 yo F former smoker, followed here for obstructive sleep apnea and for hx bronchitis, allergic rhinitis, complicated by DM, hypothyroid, CAD , OSA FOLLOWS FOR: DME [id number] had respiratory infection over Thanksgiving. Dr. Forde Dandy gave abx (Augmentin) last week and she feels better. She got steroid injection by Dr. Amedeo Plenty last week as well. Patient still on abx, she still has sl cough and wheeze at times. She denies sob at this time . Abnl nuclear stress test then cath- CAD/ stents, no progression CPAP 13/ [facility]- all night every night  12/08/14- 25 yo F former smoker, followed here for obstructive sleep apnea and for hx bronchitis, allergic rhinitis, complicated by DM, hypothyroid, CAD , OSA Follows for: prod cough at times w/yellow mucus; hoarseness; feels like back of throat is "gritty"; feels R side stopped up; x 1 mth CPAP 13/ [facility]- all night every night Gabapentin some help with chronic cough at 100 mg 3 times a day. Right ear itches. Cough worse at night,  lying down or if hot or humid.  07/10/2015-60 year old female former smoker followed for OSA, chronic bronchitis/chronic cough, allergic rhinitis, complicated by DM, hypothyroid, CAD CPAP 13/[facility] FOLLOWS FOR: Pt states cough-productive-yellow in color has come back; SOB and wheezing as well. Had chills Wednesday night but none since-did not check for fever. Waxing and waning malaise and cough, some recurrent chills and sneezing without fever or purulent sputum. Augmentin had seemed  to help but symptoms never completely cleared and Zithromax did little. Cough with scant phlegm. This all began as a sinus infection. Continues CPAP with good compliance and control described, 61  09/14/2015-60 year old female former smoker followed for OSA, chronic bronchitis/chronic cough, allergic rhinitis, complicated by DM, hypothyroid, CAD CPAP 13/[facility] Seen by nurse practitioner 07/21/2015 after ER visit for sinusitis and bronchitis treated with Avelox, prednisone, then required Diflucan for yeast. ACUTE VISIT: Per Dr Luan Moore continues to have nasal issues with it draining to her chest causing a cough-was given Hydrocodone cough syrup to help with cough at night. Pt takes abx and clears up for about 1 week then comes right back and worsens each time. CXR 07/12/2015-stable with chronic scarring right middle lobe, NAD. Recurrent head congestion, green nasal discharge, full feeling with some frontal and maxillary sinus pressure pain if she leans over. Scant yellow sputum from chest. No distinct fever or chills. Ears little stuffy, hearing okay. We spent some time discussing previous treatments for this issue. Extended visit  ROS-see HPI Constitutional:   No-   weight loss, night sweats, fevers, chills, fatigue, lassitude. HEENT:   No-  headaches, difficulty swallowing, tooth/dental problems, sore throat,      No-sneezing, itching, ear ache, +nasal congestion, + post nasal drip,  CV:  No- chest pain, no-orthopnea, PND, swelling in lower extremities, anasarca,  dizziness, palpitations Resp: No-   shortness of breath with exertion or at rest.            +  productive cough,  + non-productive cough,  No- coughing up of blood.              No-change in color of mucus.  No- wheezing.   Skin: No-   rash or lesions. GI:  No-   heartburn, indigestion, abdominal pain, nausea, vomiting,  GU: . MS:  No-   joint pain or swelling.  . Neuro-     nothing unusual Psych:  No- change in mood or  affect. No depression or anxiety.  No memory loss.  Objective:   Physical Exam OBJ- Physical Exam General- Alert, Oriented, Affect-appropriate, Distress- none acute Skin- rash-none, lesions- none, excoriation- none Lymphadenopathy- none Head- atraumatic            Eyes- + periorbital edema, Gross vision intact, PERRLA, conjunctivae and secretions clear            Ears- Hearing, canals-normal,             Nose- No- turb edema,  no-Septal dev, mucus bridging +, polyps, erosion, perforation             Throat- Mallampati II , mucosa clear , drainage- none, tonsils- atrophic Neck- flexible , trachea midline, no stridor , thyroid nl, carotid no bruit Chest - symmetrical excursion , unlabored           Heart/CV- RRR , no murmur , no gallop  , no rub, nl s1 s2                           -  JVD- none , edema- none, stasis changes- none, varices- none           Lung-  +Coarse breath sounds, but No-wheeze,  cough +, dullness-none, rub- none           Chest wall-  Abd-  Br/ Gen/ Rectal- Not done, not indicated Extrem- cyanosis- none, clubbing, none, atrophy- none, strength- nl Neuro- grossly intact to observation

## 2015-09-14 NOTE — Patient Instructions (Addendum)
Script for augmentin antibiotic sent  Scripts printed for cough syrup and diflucan  It may help your stomach to take an otc probiotic while you are on the antibiotic: Floarastor, Align, or Activia yogurt   Order- lab- CBC w diff     Dx acute maxillary sinusitis  Order- limited CT max fac               "

## 2015-09-15 NOTE — Assessment & Plan Note (Signed)
Need status clarification-download

## 2015-09-15 NOTE — Assessment & Plan Note (Signed)
Treated as subacute pansinusitis Plan-Augmentin, sputum culture, limited CT sinuses

## 2015-09-15 NOTE — Assessment & Plan Note (Signed)
Chest x-ray in December showed no active disease. We will repeat if needed. Plan-Augmentin, CBC with differential

## 2015-09-16 ENCOUNTER — Telehealth: Payer: Self-pay | Admitting: Internal Medicine

## 2015-09-16 NOTE — Telephone Encounter (Signed)
Notes Recorded by Deneise Lever, MD on 09/14/2015 at 8:38 PM CBC- mild elevation of white blood count would be consistent with infection.  Called and spoke with patient. Reviewed results and recs. Pt voiced understanding and had no further questions. Nothing further needed.

## 2015-09-16 NOTE — Progress Notes (Signed)
Quick Note:  Called and spoke with pt. Reviewed results and recs. Pt voiced understanding and had no further questions. ______ 

## 2015-09-17 ENCOUNTER — Other Ambulatory Visit: Payer: Self-pay | Admitting: Cardiovascular Disease

## 2015-09-17 NOTE — Telephone Encounter (Signed)
REFILL 

## 2015-09-18 ENCOUNTER — Ambulatory Visit (HOSPITAL_BASED_OUTPATIENT_CLINIC_OR_DEPARTMENT_OTHER)
Admission: RE | Admit: 2015-09-18 | Discharge: 2015-09-18 | Disposition: A | Discharge status: Home / Self Care | Payer: 59 | Source: Ambulatory Visit | Attending: Internal Medicine | Admitting: Internal Medicine

## 2015-09-18 DIAGNOSIS — J019 Acute sinusitis, unspecified: Secondary | ICD-10-CM | POA: Insufficient documentation

## 2015-09-23 ENCOUNTER — Encounter (HOSPITAL_BASED_OUTPATIENT_CLINIC_OR_DEPARTMENT_OTHER): Payer: Self-pay | Admitting: *Deleted

## 2015-09-23 ENCOUNTER — Emergency Department (HOSPITAL_BASED_OUTPATIENT_CLINIC_OR_DEPARTMENT_OTHER): Payer: 59

## 2015-09-23 ENCOUNTER — Emergency Department (HOSPITAL_BASED_OUTPATIENT_CLINIC_OR_DEPARTMENT_OTHER)
Admission: EM | Admit: 2015-09-23 | Discharge: 2015-09-23 | Disposition: A | Discharge status: Home / Self Care | Payer: 59 | Attending: Emergency Medicine | Admitting: Emergency Medicine

## 2015-09-23 DIAGNOSIS — Z7902 Long term (current) use of antithrombotics/antiplatelets: Secondary | ICD-10-CM | POA: Insufficient documentation

## 2015-09-23 DIAGNOSIS — Z79899 Other long term (current) drug therapy: Secondary | ICD-10-CM | POA: Insufficient documentation

## 2015-09-23 DIAGNOSIS — Z8669 Personal history of other diseases of the nervous system and sense organs: Secondary | ICD-10-CM | POA: Insufficient documentation

## 2015-09-23 DIAGNOSIS — E039 Hypothyroidism, unspecified: Secondary | ICD-10-CM | POA: Insufficient documentation

## 2015-09-23 DIAGNOSIS — E119 Type 2 diabetes mellitus without complications: Secondary | ICD-10-CM | POA: Insufficient documentation

## 2015-09-23 DIAGNOSIS — M1990 Unspecified osteoarthritis, unspecified site: Secondary | ICD-10-CM | POA: Diagnosis not present

## 2015-09-23 DIAGNOSIS — Z862 Personal history of diseases of the blood and blood-forming organs and certain disorders involving the immune mechanism: Secondary | ICD-10-CM | POA: Insufficient documentation

## 2015-09-23 DIAGNOSIS — J069 Acute upper respiratory infection, unspecified: Secondary | ICD-10-CM | POA: Diagnosis not present

## 2015-09-23 DIAGNOSIS — R079 Chest pain, unspecified: Secondary | ICD-10-CM | POA: Insufficient documentation

## 2015-09-23 DIAGNOSIS — Z7984 Long term (current) use of oral hypoglycemic drugs: Secondary | ICD-10-CM | POA: Diagnosis not present

## 2015-09-23 DIAGNOSIS — I25119 Atherosclerotic heart disease of native coronary artery with unspecified angina pectoris: Secondary | ICD-10-CM | POA: Insufficient documentation

## 2015-09-23 DIAGNOSIS — Z791 Long term (current) use of non-steroidal anti-inflammatories (NSAID): Secondary | ICD-10-CM | POA: Insufficient documentation

## 2015-09-23 DIAGNOSIS — Z87891 Personal history of nicotine dependence: Secondary | ICD-10-CM | POA: Diagnosis not present

## 2015-09-23 DIAGNOSIS — I10 Essential (primary) hypertension: Secondary | ICD-10-CM | POA: Insufficient documentation

## 2015-09-23 DIAGNOSIS — R0602 Shortness of breath: Secondary | ICD-10-CM | POA: Diagnosis present

## 2015-09-23 LAB — CBC WITH DIFFERENTIAL/PLATELET
BASOS ABS: 0 10*3/uL (ref 0.0–0.1)
Basophils Relative: 0 %
Eosinophils Absolute: 0.6 10*3/uL (ref 0.0–0.7)
Eosinophils Relative: 6 %
HEMATOCRIT: 36.7 % (ref 36.0–46.0)
HEMOGLOBIN: 11.6 g/dL — AB (ref 12.0–15.0)
LYMPHS PCT: 37 %
Lymphs Abs: 3.6 10*3/uL (ref 0.7–4.0)
MCH: 24.9 pg — ABNORMAL LOW (ref 26.0–34.0)
MCHC: 31.6 g/dL (ref 30.0–36.0)
MCV: 78.9 fL (ref 78.0–100.0)
Monocytes Absolute: 0.8 10*3/uL (ref 0.1–1.0)
Monocytes Relative: 8 %
NEUTROS ABS: 4.8 10*3/uL (ref 1.7–7.7)
NEUTROS PCT: 49 %
PLATELETS: 323 10*3/uL (ref 150–400)
RBC: 4.65 MIL/uL (ref 3.87–5.11)
RDW: 15.3 % (ref 11.5–15.5)
WBC: 9.7 10*3/uL (ref 4.0–10.5)

## 2015-09-23 LAB — COMPREHENSIVE METABOLIC PANEL
ALT: 16 U/L (ref 14–54)
AST: 17 U/L (ref 15–41)
Albumin: 3.8 g/dL (ref 3.5–5.0)
Alkaline Phosphatase: 80 U/L (ref 38–126)
Anion gap: 11 (ref 5–15)
BILIRUBIN TOTAL: 0.4 mg/dL (ref 0.3–1.2)
BUN: 19 mg/dL (ref 6–20)
CHLORIDE: 99 mmol/L — AB (ref 101–111)
CO2: 27 mmol/L (ref 22–32)
CREATININE: 1.08 mg/dL — AB (ref 0.44–1.00)
Calcium: 8.9 mg/dL (ref 8.9–10.3)
GFR calc Af Amer: >60 mL/min (ref >60)
GFR, EST NON AFRICAN AMERICAN: 55 mL/min — AB (ref >60)
Glucose, Bld: 211 mg/dL — ABNORMAL HIGH (ref 65–99)
Potassium: 4.2 mmol/L (ref 3.5–5.1)
Sodium: 137 mmol/L (ref 135–145)
Total Protein: 8.4 g/dL — ABNORMAL HIGH (ref 6.5–8.1)

## 2015-09-23 LAB — BRAIN NATRIURETIC PEPTIDE: B NATRIURETIC PEPTIDE 5: 11.1 pg/mL (ref 0.0–100.0)

## 2015-09-23 LAB — TROPONIN I: Troponin I: <0.03 ng/mL (ref <0.031)

## 2015-09-23 MED ORDER — LORATADINE 10 MG PO TABS: 10.0000 mg | ORAL_TABLET | Freq: Every day | ORAL | Status: DC

## 2015-09-23 MED ORDER — METHYLPREDNISOLONE SODIUM SUCC 125 MG IJ SOLR
125.0000 mg | Freq: Once | INTRAMUSCULAR | Status: AC
  Administered 2015-09-23: 125 mg via INTRAVENOUS

## 2015-09-23 MED ORDER — PREDNISONE 20 MG PO TABS: 40.0000 mg | ORAL_TABLET | Freq: Every day | ORAL | Status: AC

## 2015-09-23 MED ORDER — ALBUTEROL SULFATE (2.5 MG/3ML) 0.083% IN NEBU
5.0000 mg | INHALATION_SOLUTION | Freq: Once | RESPIRATORY_TRACT | Status: AC
  Administered 2015-09-23: 5 mg via RESPIRATORY_TRACT
  Filled 2015-09-23: qty 6

## 2015-09-23 MED ORDER — METHYLPREDNISOLONE SODIUM SUCC 125 MG IJ SOLR
125.0000 mg | Freq: Once | INTRAMUSCULAR | Status: DC
  Filled 2015-09-23: qty 2

## 2015-09-23 NOTE — Discharge Instructions (Signed)
Upper Respiratory Infection, Adult  Most upper respiratory infections (URIs) are a viral infection of the air passages leading to the lungs. A URI affects the nose, throat, and upper air passages. The most common type of URI is nasopharyngitis and is typically referred to as "the common cold."  URIs run their course and usually go away on their own. Most of the time, a URI does not require medical attention, but sometimes a bacterial infection in the upper airways can follow a viral infection. This is called a secondary infection. Sinus and middle ear infections are common types of secondary upper respiratory infections.  Bacterial pneumonia can also complicate a URI. A URI can worsen asthma and chronic obstructive pulmonary disease (COPD). Sometimes, these complications can require emergency medical care and may be life threatening.   CAUSES  Almost all URIs are caused by viruses. A virus is a type of germ and can spread from one person to another.   RISKS FACTORS  You may be at risk for a URI if:    You smoke.    You have chronic heart or lung disease.   You have a weakened defense (immune) system.    You are very young or very old.    You have nasal allergies or asthma.   You work in crowded or poorly ventilated areas.   You work in health care facilities or schools.  SIGNS AND SYMPTOMS   Symptoms typically develop 2-3 days after you come in contact with a cold virus. Most viral URIs last 7-10 days. However, viral URIs from the influenza virus (flu virus) can last 14-18 days and are typically more severe. Symptoms may include:    Runny or stuffy (congested) nose.    Sneezing.    Cough.    Sore throat.    Headache.    Fatigue.    Fever.    Loss of appetite.    Pain in your forehead, behind your eyes, and over your cheekbones (sinus pain).   Muscle aches.   DIAGNOSIS   Your health care provider may diagnose a URI by:   Physical exam.   Tests to check that your symptoms are not due to  another condition such as:   Strep throat.   Sinusitis.   Pneumonia.   Asthma.  TREATMENT   A URI goes away on its own with time. It cannot be cured with medicines, but medicines may be prescribed or recommended to relieve symptoms. Medicines may help:   Reduce your fever.   Reduce your cough.   Relieve nasal congestion.  HOME CARE INSTRUCTIONS    Take medicines only as directed by your health care provider.    Gargle warm saltwater or take cough drops to comfort your throat as directed by your health care provider.   Use a warm mist humidifier or inhale steam from a shower to increase air moisture. This may make it easier to breathe.   Drink enough fluid to keep your urine clear or pale yellow.    Eat soups and other clear broths and maintain good nutrition.    Rest as needed.    Return to work when your temperature has returned to normal or as your health care provider advises. You may need to stay home longer to avoid infecting others. You can also use a face mask and careful hand washing to prevent spread of the virus.   Increase the usage of your inhaler if you have asthma.    Do not   use any tobacco products, including cigarettes, chewing tobacco, or electronic cigarettes. If you need help quitting, ask your health care provider.  PREVENTION   The best way to protect yourself from getting a cold is to practice good hygiene.    Avoid oral or hand contact with people with cold symptoms.    Wash your hands often if contact occurs.   There is no clear evidence that vitamin C, vitamin E, echinacea, or exercise reduces the chance of developing a cold. However, it is always recommended to get plenty of rest, exercise, and practice good nutrition.   SEEK MEDICAL CARE IF:    You are getting worse rather than better.    Your symptoms are not controlled by medicine.    You have chills.   You have worsening shortness of breath.   You have brown or red mucus.   You have yellow or brown nasal  discharge.   You have pain in your face, especially when you bend forward.   You have a fever.   You have swollen neck glands.   You have pain while swallowing.   You have white areas in the back of your throat.  SEEK IMMEDIATE MEDICAL CARE IF:    You have severe or persistent:    Headache.    Ear pain.    Sinus pain.    Chest pain.   You have chronic lung disease and any of the following:    Wheezing.    Prolonged cough.    Coughing up blood.    A change in your usual mucus.   You have a stiff neck.   You have changes in your:    Vision.    Hearing.    Thinking.    Mood.  MAKE SURE YOU:    Understand these instructions.   Will watch your condition.   Will get help right away if you are not doing well or get worse.     This information is not intended to replace advice given to you by your health care provider. Make sure you discuss any questions you have with your health care provider.     Document Released: 01/18/2001 Document Revised: 12/09/2014 Document Reviewed: 10/30/2013  Elsevier Interactive Patient Education 2016 [employer]    Bronchospasm, Adult  A bronchospasm is a spasm or tightening of the airways going into the lungs. During a bronchospasm breathing becomes more difficult because the airways get smaller. When this happens there can be coughing, a whistling sound when breathing (wheezing), and difficulty breathing. Bronchospasm is often associated with asthma, but not all patients who experience a bronchospasm have asthma.  CAUSES   A bronchospasm is caused by inflammation or irritation of the airways. The inflammation or irritation may be triggered by:    Allergies (such as to animals, pollen, food, or mold). Allergens that cause bronchospasm may cause wheezing immediately after exposure or many hours later.    Infection. Viral infections are believed to be the most common cause of bronchospasm.    Exercise.    Irritants (such as pollution, cigarette smoke, strong odors, aerosol  sprays, and paint fumes).    Weather changes. Winds increase molds and pollens in the air. Rain refreshes the air by washing irritants out. Cold air may cause inflammation.    Stress and emotional upset.   SIGNS AND SYMPTOMS    Wheezing.    Excessive nighttime coughing.    Frequent or severe coughing with a simple cold.      Chest tightness.    Shortness of breath.   DIAGNOSIS   Bronchospasm is usually diagnosed through a history and physical exam. Tests, such as chest X-rays, are sometimes done to look for other conditions.  TREATMENT    Inhaled medicines can be given to open up your airways and help you breathe. The medicines can be given using either an inhaler or a nebulizer machine.   Corticosteroid medicines may be given for severe bronchospasm, usually when it is associated with asthma.  HOME CARE INSTRUCTIONS    Always have a plan prepared for seeking medical care. Know when to call your health care provider and local emergency services (911 in the [place] Know where you can access local emergency care.   Only take medicines as directed by your health care provider.   If you were prescribed an inhaler or nebulizer machine, ask your health care provider to explain how to use it correctly. Always use a spacer with your inhaler if you were given one.   It is necessary to remain calm during an attack. Try to relax and breathe more slowly.   Control your home environment in the following ways:     Change your heating and air conditioning filter at least once a month.     Limit your use of fireplaces and wood stoves.    Do not smoke and do not allow smoking in your home.     Avoid exposure to perfumes and fragrances.     Get rid of pests (such as roaches and mice) and their droppings.     Throw away plants if you see mold on them.     Keep your house clean and dust free.     Replace carpet with wood, tile, or vinyl flooring. Carpet can trap dander and dust.     Use allergy-proof  pillows, mattress covers, and box spring covers.     Wash bed sheets and blankets every week in hot water and dry them in a dryer.     Use blankets that are made of polyester or cotton.     Wash hands frequently.  SEEK MEDICAL CARE IF:    You have muscle aches.    You have chest pain.    The sputum changes from clear or white to yellow, green, gray, or bloody.    The sputum you cough up gets thicker.    There are problems that may be related to the medicine you are given, such as a rash, itching, swelling, or trouble breathing.   SEEK IMMEDIATE MEDICAL CARE IF:    You have worsening wheezing and coughing even after taking your prescribed medicines.    You have increased difficulty breathing.    You develop severe chest pain.  MAKE SURE YOU:    Understand these instructions.   Will watch your condition.   Will get help right away if you are not doing well or get worse.     This information is not intended to replace advice given to you by your health care provider. Make sure you discuss any questions you have with your health care provider.     Document Released: 07/28/2003 Document Revised: 08/15/2014 Document Reviewed: 01/14/2013  Elsevier Interactive Patient Education 2016 Elsevier Inc.

## 2015-09-23 NOTE — ED Provider Notes (Signed)
CSN: [id number]     Arrival date & time 09/23/15  1801 History  By signing my name below, I, Terrance Branch, attest that this documentation has been prepared under the direction and in the presence of Julianne Rice, MD. Electronically Signed: Randa Evens, ED Scribe. 09/23/2015. 6:50 PM.      Chief Complaint  Patient presents with  . Shortness of Breath    Patient is a 60 y.o. female presenting with shortness of breath. The history is provided by the patient. No language interpreter was used.  Shortness of Breath Associated symptoms: cough and wheezing   Associated symptoms: no abdominal pain, no chest pain, no fever, no headaches, no neck pain, no rash and no sore throat    HPI Comments: MERLYN WIKER is a 60 y.o. female who presents to the Emergency Department complaining of chest tightness onset today. Pt describes her symptoms as if she cant get a full breath. She reports associated fatigue, cough, congestion, sinus pressure and wheezing. She also reports right sided chest pain. Intermittent cramping around "rib cage and bilateral legs. Pt states that her symptoms are worse with exertion. Pt states that she tried her inhaler and nebulizer with no relief. Pt states she has tried Mucinex with no relief. She does report recently starting Augmentin 1 day prior. She does report recent sick contacts at work. 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   Past Medical History  Diagnosis Date  . Unspecified hypothyroidism   . Cough   . Acute bronchitis   . Obstructive sleep apnea (adult) (pediatric)   . Coronary atherosclerosis   . Anginal pain ([place])   . Hypertension   . Type II or unspecified type diabetes mellitus without mention of complication, not stated as uncontrolled     type 2  . Arthritis     osteo  . Anemia     hx of anemia   Past Surgical History  Procedure Laterality Date  . Tubal ligation    . Anterior fusion cervical spine    .  Wisdom tooth extraction    . Left heart catheterization with coronary angiogram N/A 05/29/2014    Procedure: LEFT HEART CATHETERIZATION WITH CORONARY ANGIOGRAM;  Surgeon: Sinclair Grooms, MD;  Location: [facility] C[facility] LAB;  Service: Cardiovascular;  Laterality: N/A;   Family History  Problem Relation Age of Onset  . Hypertension Mother   . Heart disease Mother   . Prostate cancer Father   . Heart disease Brother   . Heart attack Sister   . Stroke Mother    Social History  Substance Use Topics  . Smoking status: Former Smoker -- 0.10 packs/day for 30 years    Quit date: 08/09/2003  . Smokeless tobacco: Never Used  . Alcohol Use: No   OB History    No data available     Review of Systems  Constitutional: Negative for fever and chills.  HENT: Positive for rhinorrhea and sinus pressure. Negative for sore throat.   Respiratory: Positive for cough, chest tightness, shortness of breath and wheezing.   Cardiovascular: Negative for chest pain, palpitations and leg swelling.  Gastrointestinal: Negative for nausea, abdominal pain, diarrhea and constipation.  Genitourinary: Negative for dysuria, frequency and flank pain.  Musculoskeletal: Positive for myalgias. Negative for back pain, neck pain and neck stiffness.  Skin: Negative for rash and wound.  Neurological: Negative for dizziness, weakness, light-headedness, numbness and headaches.  All other systems reviewed  and are negative.     Allergies  Codeine; Levaquin; Sulfonamide derivatives; and Sulfa antibiotics  Home Medications   Prior to Admission medications   Medication Sig Start Date End Date Taking? Authorizing Provider  ADVAIR DISKUS 250-50 MCG/DOSE AEPB INHALE ONE (1) PUFF(S) TWICE DAILY, RINSE MOUTH. 04/16/15   Deneise Lever, MD  albuterol (PROVENTIL HFA) 108 (90 BASE) MCG/ACT inhaler Inhale 2 puffs into the lungs every 6 (six) hours as needed for wheezing or shortness of breath. For shortness of breath and wheezing 11/08/11  09/14/15  Deneise Lever, MD  albuterol (PROVENTIL) (2.5 MG/3ML) 0.083% nebulizer solution Take 3 mLs (2.5 mg total) by nebulization every 6 (six) hours as needed for wheezing or shortness of breath. 06/30/15 02/07/18  Deneise Lever, MD  amoxicillin-clavulanate (AUGMENTIN) 875-125 MG tablet Take 1 tablet by mouth 2 (two) times daily. 09/14/15   Deneise Lever, MD  aspirin 81 MG chewable tablet Chew 1 tablet (81 mg total) by mouth daily. 05/30/14   Evelene Croon Barrett, PA-C  atorvastatin (LIPITOR) 80 MG tablet Take 1 tablet Monday, Wednesday, and Friday 09/11/15   Eileen Stanford, PA-C  clopidogrel (PLAVIX) 75 MG tablet Take 1 tablet (75 mg total) by mouth daily. 09/11/15   Eileen Stanford, PA-C  diazepam (VALIUM) 5 MG tablet Take 0.5 tablets by mouth as needed. .5 to 1 tablet prn for muscle cramps 10/28/11   Historical Provider, MD  diclofenac (VOLTAREN) 75 MG EC tablet Take 75 mg by mouth daily as needed. For muscle aches    Historical Provider, MD  fluconazole (DIFLUCAN) 150 MG tablet Take 1 tablet (150 mg total) by mouth daily. 09/14/15   Deneise Lever, MD  HYDROcodone-homatropine (HYCODAN) 5-1.5 MG/5ML syrup Take 5 mLs by mouth every 6 (six) hours as needed for cough. 09/14/15   Deneise Lever, MD  Insulin Detemir (LEVEMIR FLEXPEN [place]) Inject 15 Units into the skin at bedtime.     Historical Provider, MD  INVOKANA 100 MG TABS Take 100 mg by mouth daily before breakfast.  07/03/13   Historical Provider, MD  ipratropium (ATROVENT) 0.02 % nebulizer solution Take 2.5 mLs (0.5 mg total) by nebulization 4 (four) times daily. 07/12/15   Charlesetta Shanks, MD  levothyroxine (SYNTHROID, LEVOTHROID) 200 MCG tablet Take 200 mcg by mouth daily.    Historical Provider, MD  loratadine (CLARITIN) 10 MG tablet Take 1 tablet (10 mg total) by mouth daily. 09/23/15   Julianne Rice, MD  metFORMIN (GLUMETZA) 1000 MG (MOD) 24 hr tablet Take 1 tablet (1,000 mg total) by mouth 2 (two) times daily with a meal. HOLD for 48 hours,  restart on 06/01/2014. 05/30/14   Evelene Croon Barrett, PA-C  metoprolol succinate (TOPROL-XL) 25 MG 24 hr tablet Take 1 tablet (25 mg total) by mouth daily. 09/11/15   Eileen Stanford, PA-C  NOVOTWIST 32G X 5 MM MISC Use as directed 05/04/11   Historical Provider, MD  omega-3 acid ethyl esters (LOVAZA) 1 g capsule TAKE TWO CAPSULES BY MOUTH TWICE A DAY 09/17/15   Troy Sine, MD  ONE TOUCH ULTRA TEST test strip Use as directed 05/05/11   Historical Provider, MD  predniSONE (DELTASONE) 20 MG tablet Take 2 tablets (40 mg total) by mouth daily. 09/24/15 09/28/15  Julianne Rice, MD  triamterene-hydrochlorothiazide (MAXZIDE-25) 37.5-25 MG per tablet Take 1 tablet by mouth daily. 05/29/14   Historical Provider, MD  verapamil (CALAN-SR) 240 MG CR tablet Take 1 tablet by mouth daily. 05/26/14  Historical Provider, MD   BP 138/89 mmHg  Pulse 76  Temp(Src) 98.5 F (36.9 C) (Oral)  Resp 18  Ht 5\' 2"  (1.575 m)  Wt 167 lb (75.751 kg)  BMI 30.54 kg/m2  SpO2 97%   Physical Exam  Constitutional: She is oriented to person, place, and time. She appears well-developed and well-nourished. No distress.  HENT:  Head: Normocephalic and atraumatic.  Mouth/Throat: Oropharynx is clear and moist. No oropharyngeal exudate.  Bilateral nasal mucosal edema. No sinus tenderness to percussion  Eyes: EOM are normal. Pupils are equal, round, and reactive to light.  Neck: Normal range of motion. Neck supple. No JVD present.  Cardiovascular: Normal rate and regular rhythm.  Exam reveals no gallop and no friction rub.   No murmur heard. Pulmonary/Chest: Effort normal. No stridor. No respiratory distress. She has wheezes. She has no rales. She exhibits no tenderness.  Decreased air movement and expiratory wheezing in the right upper lobe  Abdominal: Soft. Bowel sounds are normal. She exhibits no distension and no mass. There is no tenderness. There is no rebound and no guarding.  Musculoskeletal: Normal range of motion. She  exhibits no edema or tenderness.  No lower extremity swelling or pain. Distal pulses equal and intact.  Neurological: She is alert and oriented to person, place, and time.  Patient is alert and oriented x3 with clear, goal oriented speech. Patient has 5/5 motor in all extremities. Sensation is intact to light touch. Bilateral finger-to-nose is normal with no signs of dysmetria.  Skin: Skin is warm and dry. No rash noted. No erythema.  Psychiatric: She has a normal mood and affect. Her behavior is normal.  Nursing note and vitals reviewed.   ED Course  Procedures (including critical care time) DIAGNOSTIC STUDIES: Oxygen Saturation is 96% on RA, normal by my interpretation.    COORDINATION OF CARE: 6:49 PM-Discussed treatment plan with pt at bedside and pt agreed to plan.     Labs Review Labs Reviewed  CBC WITH DIFFERENTIAL/PLATELET - Abnormal; Notable for the following:    Hemoglobin 11.6 (*)    MCH 24.9 (*)    All other components within normal limits  COMPREHENSIVE METABOLIC PANEL - Abnormal; Notable for the following:    Chloride 99 (*)    Glucose, Bld 211 (*)    Creatinine, Ser 1.08 (*)    Total Protein 8.4 (*)    GFR calc non Af Amer 55 (*)    All other components within normal limits  BRAIN NATRIURETIC PEPTIDE  TROPONIN I    Imaging Review Dg Chest 2 View  09/23/2015  CLINICAL DATA:  Shortness of breath. EXAM: CHEST  2 VIEW COMPARISON:  07/12/2015 FINDINGS: The lungs are clear wiithout focal pneumonia, edema, pneumothorax or pleural effusion. Cardiopericardial silhouette is at upper limits of normal for size. The visualized bony structures of the thorax are intact. Telemetry leads overlie the chest. IMPRESSION: No active cardiopulmonary disease. Electronically Signed   By: Misty Stanley M.D.   On: 09/23/2015 19:22      EKG Interpretation   Date/Time:  Wednesday September 23 2015 18:10:05 EST Ventricular Rate:  63 PR Interval:  164 QRS Duration: 78 QT Interval:   448 QTC Calculation: 458 R Axis:   58 Text Interpretation:  Normal sinus rhythm Normal ECG Confirmed by  Lita Mains  MD, Lynisha Osuch ([id number]) on 09/23/2015 8:52:11 PM      MDM   Final diagnoses:  URI (upper respiratory infection)      I personally performed  the services described in this documentation, which was scribed in my presence. The recorded information has been reviewed and is accurate.   She says she's feeling much better. As can be discharged home. Her symptoms have significantly improved. Lungs are clear. The symptoms are likely allergy related. Will start on Claritin and short course of present. She's been instructed to follow-up with her pulmonologist.  Patient's chest tightness is been constant throughout the day. She has a normal troponin and EKG without ischemic changes. This is related to her respiratory symptoms. She's been advised to follow-up with her cardiologist. Return precautions given.  Julianne Rice, MD 09/23/15 2053

## 2015-09-23 NOTE — ED Notes (Signed)
Pt to triage room 1 reporting cough, congestion and sinus pain x October, with pains in her chest, "like cold is in there". Pt states she also feels sob at times, ekg in progress at triage.

## 2015-09-24 ENCOUNTER — Telehealth: Payer: Self-pay | Admitting: Internal Medicine

## 2015-09-24 NOTE — Telephone Encounter (Signed)
lmomtcb x1 for pt to offer appt below

## 2015-09-24 NOTE — Telephone Encounter (Signed)
Per CY-okay to have patient come in within 2 weeks-pt can be seen by CY on Thursday 10-08-15 at 4:15pm slot. Thanks.

## 2015-09-24 NOTE — Telephone Encounter (Signed)
Spoke with pt, states she was hospitalized yesterday for URI, was told to follow up with CY.  Pt aware that CY has no openings until May.  Pt ok with seeing TP if needed.  Pt needs appt between 10:45 and 12:00.   CY/Katie please advise on appt for pt.  Thanks!

## 2015-09-25 NOTE — Telephone Encounter (Signed)
Spoke with pt, states she saw PCP yesterday who told her she did not need to follow up with CY at this time as she followed up with him.  Nothing further needed.

## 2015-10-05 ENCOUNTER — Telehealth: Payer: Self-pay | Admitting: Internal Medicine

## 2015-10-05 DIAGNOSIS — R059 Cough, unspecified: Secondary | ICD-10-CM

## 2015-10-05 DIAGNOSIS — R05 Cough: Secondary | ICD-10-CM

## 2015-10-05 DIAGNOSIS — J019 Acute sinusitis, unspecified: Secondary | ICD-10-CM

## 2015-10-05 NOTE — Telephone Encounter (Signed)
Patient says that as long as she stays on antibiotics, her sinuses are clear, she used Flonase, but it did not help at all.  Patient says that she tried Cloricidin and it helped a little, but she said that now the drainage is going down into the chest.  Patient says that she would like referral to ENT to find out why she is having so much sinus issues.  Dr. Annamaria Boots, ok to refer to ENT?  Please advise.  Current Outpatient Prescriptions on File Prior to Visit  Medication Sig Dispense Refill  . ADVAIR DISKUS 250-50 MCG/DOSE AEPB INHALE ONE (1) PUFF(S) TWICE DAILY, RINSE MOUTH. 60 each 6  . albuterol (PROVENTIL HFA) 108 (90 BASE) MCG/ACT inhaler Inhale 2 puffs into the lungs every 6 (six) hours as needed for wheezing or shortness of breath. For shortness of breath and wheezing 1 Inhaler prn  . albuterol (PROVENTIL) (2.5 MG/3ML) 0.083% nebulizer solution Take 3 mLs (2.5 mg total) by nebulization every 6 (six) hours as needed for wheezing or shortness of breath. 75 mL 5  . amoxicillin-clavulanate (AUGMENTIN) 875-125 MG tablet Take 1 tablet by mouth 2 (two) times daily. 20 tablet 0  . aspirin 81 MG chewable tablet Chew 1 tablet (81 mg total) by mouth daily.    Marland Kitchen atorvastatin (LIPITOR) 80 MG tablet Take 1 tablet Monday, Wednesday, and Friday 36 tablet 3  . clopidogrel (PLAVIX) 75 MG tablet Take 1 tablet (75 mg total) by mouth daily. 90 tablet 3  . diazepam (VALIUM) 5 MG tablet Take 0.5 tablets by mouth as needed. .5 to 1 tablet prn for muscle cramps    . diclofenac (VOLTAREN) 75 MG EC tablet Take 75 mg by mouth daily as needed. For muscle aches    . fluconazole (DIFLUCAN) 150 MG tablet Take 1 tablet (150 mg total) by mouth daily. 7 tablet 1  . HYDROcodone-homatropine (HYCODAN) 5-1.5 MG/5ML syrup Take 5 mLs by mouth every 6 (six) hours as needed for cough. 240 mL 0  . Insulin Detemir (LEVEMIR FLEXPEN [place]) Inject 15 Units into the skin at bedtime.     . INVOKANA 100 MG TABS Take 100 mg by mouth daily before  breakfast.     . ipratropium (ATROVENT) 0.02 % nebulizer solution Take 2.5 mLs (0.5 mg total) by nebulization 4 (four) times daily. 75 mL 12  . levothyroxine (SYNTHROID, LEVOTHROID) 200 MCG tablet Take 200 mcg by mouth daily.    Marland Kitchen loratadine (CLARITIN) 10 MG tablet Take 1 tablet (10 mg total) by mouth daily. 30 tablet 0  . metFORMIN (GLUMETZA) 1000 MG (MOD) 24 hr tablet Take 1 tablet (1,000 mg total) by mouth 2 (two) times daily with a meal. HOLD for 48 hours, restart on 06/01/2014.    . metoprolol succinate (TOPROL-XL) 25 MG 24 hr tablet Take 1 tablet (25 mg total) by mouth daily. 90 tablet 3  . NOVOTWIST 32G X 5 MM MISC Use as directed    . omega-3 acid ethyl esters (LOVAZA) 1 g capsule TAKE TWO CAPSULES BY MOUTH TWICE A DAY 180 capsule 3  . ONE TOUCH ULTRA TEST test strip Use as directed    . triamterene-hydrochlorothiazide (MAXZIDE-25) 37.5-25 MG per tablet Take 1 tablet by mouth daily.    . verapamil (CALAN-SR) 240 MG CR tablet Take 1 tablet by mouth daily.     No current facility-administered medications on file prior to visit.   Allergies  Allergen Reactions  . Codeine     REACTION: hallucinations/"loopy"  . Levaquin [  Levofloxacin] Nausea And Vomiting  . Sulfonamide Derivatives Hives  . Sulfa Antibiotics Hives

## 2015-10-05 NOTE — Telephone Encounter (Signed)
Absolutely- offer referral to [facility] ENT

## 2015-10-05 NOTE — Telephone Encounter (Signed)
Patient notified that order has been placed for ENT referral. Patient says that she can be reached after 3:45, she is driving bus right now.  Fyi to [facility]

## 2015-10-05 NOTE — Telephone Encounter (Signed)
Order placed - lmomtcb for pt

## 2015-10-05 NOTE — Telephone Encounter (Signed)
I have tried to call ENT office twice this afternoon - on hold 10 minutes the first time & 5 1/2 minutes the second time.  Called pt on both phone #'s listed & left her vm letting her know of my attempts & that I will contact ENT's office & let her know once I do.

## 2015-10-05 NOTE — Telephone Encounter (Signed)
Pt returning call.[name] ° °

## 2015-10-06 NOTE — Telephone Encounter (Signed)
I tried to call [facility] ENT this morning & was on hold another 6 minutes. I decided to fax referral to them with form/records. I called pt's home # & spoke to her sister Hilda Blades. I told her I faxed referral & [facility] ENT should contact pt with appt or they will contact me & then I will let her know when appt is. She stated she would give pt the message.  Nothing further needed at this time.  I will call pt with appt info when I hear from [facility] ENT.

## 2015-10-22 ENCOUNTER — Other Ambulatory Visit (HOSPITAL_BASED_OUTPATIENT_CLINIC_OR_DEPARTMENT_OTHER): Payer: Self-pay | Admitting: Endocrinology

## 2015-10-22 DIAGNOSIS — R1011 Right upper quadrant pain: Secondary | ICD-10-CM

## 2015-10-23 ENCOUNTER — Ambulatory Visit (HOSPITAL_BASED_OUTPATIENT_CLINIC_OR_DEPARTMENT_OTHER)
Admission: RE | Admit: 2015-10-23 | Discharge: 2015-10-23 | Disposition: A | Discharge status: Home / Self Care | Payer: 59 | Source: Ambulatory Visit | Attending: Endocrinology | Admitting: Endocrinology

## 2015-10-23 DIAGNOSIS — R1011 Right upper quadrant pain: Secondary | ICD-10-CM

## 2015-11-05 ENCOUNTER — Telehealth: Payer: Self-pay | Admitting: *Deleted

## 2015-11-05 NOTE — Telephone Encounter (Signed)
Requesting surgical clearance:   1. Type of surgery:Colonoscopy  2. Surgeon: [facility]  3. Surgical date:  11/23/15  4. Medications that need to be held:  Clopidogrel  5-7 days                       Special instructions:   please advise on clopidogrel as well as clear for procedure.

## 2015-11-06 NOTE — Telephone Encounter (Signed)
Ok to hold for 5 days

## 2015-11-12 NOTE — Telephone Encounter (Signed)
Colonoscopy clearance clearance faxed to [facility] via Epic.

## 2015-11-19 ENCOUNTER — Telehealth: Payer: Self-pay | Admitting: Internal Medicine

## 2015-11-19 MED ORDER — HYDROCODONE-HOMATROPINE 5-1.5 MG/5ML PO SYRP: 5.0000 mL | ORAL_SOLUTION | Freq: Four times a day (QID) | ORAL | Status: DC | PRN

## 2015-11-19 MED ORDER — AMOXICILLIN 500 MG PO TABS: 500.0000 mg | ORAL_TABLET | Freq: Three times a day (TID) | ORAL | Status: DC

## 2015-11-19 NOTE — Telephone Encounter (Signed)
Ok to refill hycodan 

## 2015-11-19 NOTE — Telephone Encounter (Signed)
Offer amoxacillin 500 mg, # 21, 1 tab, three times daily

## 2015-11-19 NOTE — Telephone Encounter (Signed)
Rx refilled and placed on [facility] cart.  Placed in envelope with pt name and placed up front. Pt is on her way to pick up Rx. Nothing further needed.

## 2015-11-19 NOTE — Telephone Encounter (Signed)
Pt c/o chest cold which started with head congestion for the past few days.  Pt c/o chest tightness, cough and mucus production which is very thick and yellow-green in color.  Pt reports this is worse at night. Using Mucinex, Coricidin, Tylenol Sinus, Robitussin CF and DM.  Denies fever.   Please advise Dr Annamaria Boots. Thanks.   Allergies  Allergen Reactions  . Codeine     REACTION: hallucinations/"loopy"  . Levaquin [Levofloxacin] Nausea And Vomiting  . Sulfonamide Derivatives Hives  . Sulfa Antibiotics Hives     Medication List       This list is accurate as of: 11/19/15  2:29 PM.  Always use your most recent med list.               ADVAIR DISKUS 250-50 MCG/DOSE Aepb  Generic drug:  Fluticasone-Salmeterol  INHALE ONE (1) PUFF(S) TWICE DAILY, RINSE MOUTH.     albuterol 108 (90 Base) MCG/ACT inhaler  Commonly known as:  PROVENTIL HFA  Inhale 2 puffs into the lungs every 6 (six) hours as needed for wheezing or shortness of breath. For shortness of breath and wheezing     albuterol (2.5 MG/3ML) 0.083% nebulizer solution  Commonly known as:  PROVENTIL  Take 3 mLs (2.5 mg total) by nebulization every 6 (six) hours as needed for wheezing or shortness of breath.     amoxicillin-clavulanate 875-125 MG tablet  Commonly known as:  AUGMENTIN  Take 1 tablet by mouth 2 (two) times daily.     aspirin 81 MG chewable tablet  Chew 1 tablet (81 mg total) by mouth daily.     atorvastatin 80 MG tablet  Commonly known as:  LIPITOR  Take 1 tablet Monday, Wednesday, and Friday     clopidogrel 75 MG tablet  Commonly known as:  PLAVIX  Take 1 tablet (75 mg total) by mouth daily.     diazepam 5 MG tablet  Commonly known as:  VALIUM  Take 0.5 tablets by mouth as needed. .5 to 1 tablet prn for muscle cramps     diclofenac 75 MG EC tablet  Commonly known as:  VOLTAREN  Take 75 mg by mouth daily as needed. For muscle aches     fluconazole 150 MG tablet  Commonly known as:  DIFLUCAN  Take 1  tablet (150 mg total) by mouth daily.     HYDROcodone-homatropine 5-1.5 MG/5ML syrup  Commonly known as:  HYCODAN  Take 5 mLs by mouth every 6 (six) hours as needed for cough.     INVOKANA 100 MG Tabs tablet  Generic drug:  canagliflozin  Take 100 mg by mouth daily before breakfast.     ipratropium 0.02 % nebulizer solution  Commonly known as:  ATROVENT  Take 2.5 mLs (0.5 mg total) by nebulization 4 (four) times daily.     LEVEMIR FLEXPEN [place]  Inject 15 Units into the skin at bedtime.     levothyroxine 200 MCG tablet  Commonly known as:  SYNTHROID, LEVOTHROID  Take 200 mcg by mouth daily.     loratadine 10 MG tablet  Commonly known as:  CLARITIN  Take 1 tablet (10 mg total) by mouth daily.     metFORMIN 1000 MG (MOD) 24 hr tablet  Commonly known as:  GLUMETZA  Take 1 tablet (1,000 mg total) by mouth 2 (two) times daily with a meal. HOLD for 48 hours, restart on 06/01/2014.     metoprolol succinate 25 MG 24 hr tablet  Commonly known as:  TOPROL-XL  Take 1 tablet (25 mg total) by mouth daily.     NOVOTWIST 32G X 5 MM Misc  Generic drug:  Insulin Pen Needle  Use as directed     omega-3 acid ethyl esters 1 g capsule  Commonly known as:  LOVAZA  TAKE TWO CAPSULES BY MOUTH TWICE A DAY     ONE TOUCH ULTRA TEST test strip  Generic drug:  glucose blood  Use as directed     triamterene-hydrochlorothiazide 37.5-25 MG tablet  Commonly known as:  MAXZIDE-25  Take 1 tablet by mouth daily.     verapamil 240 MG CR tablet  Commonly known as:  CALAN-SR  Take 1 tablet by mouth daily.

## 2015-11-19 NOTE — Telephone Encounter (Signed)
Pt aware of med being called into pharmacy.  Pt requesting Rx cough syrup - unable to take Codeine based syrup d/t allergy Pt states that she knows she will have to come pick the Rx up if [facility] approves it.  Please advise. Thanks.

## 2015-12-08 ENCOUNTER — Telehealth: Payer: Self-pay | Admitting: Internal Medicine

## 2015-12-08 ENCOUNTER — Emergency Department (HOSPITAL_BASED_OUTPATIENT_CLINIC_OR_DEPARTMENT_OTHER): Payer: 59

## 2015-12-08 ENCOUNTER — Emergency Department (HOSPITAL_BASED_OUTPATIENT_CLINIC_OR_DEPARTMENT_OTHER)
Admission: EM | Admit: 2015-12-08 | Discharge: 2015-12-08 | Disposition: A | Discharge status: Home / Self Care | Payer: 59 | Attending: Emergency Medicine | Admitting: Emergency Medicine

## 2015-12-08 ENCOUNTER — Encounter (HOSPITAL_BASED_OUTPATIENT_CLINIC_OR_DEPARTMENT_OTHER): Payer: Self-pay | Admitting: Emergency Medicine

## 2015-12-08 DIAGNOSIS — R0602 Shortness of breath: Secondary | ICD-10-CM | POA: Insufficient documentation

## 2015-12-08 DIAGNOSIS — R079 Chest pain, unspecified: Secondary | ICD-10-CM

## 2015-12-08 DIAGNOSIS — R0600 Dyspnea, unspecified: Secondary | ICD-10-CM

## 2015-12-08 DIAGNOSIS — E039 Hypothyroidism, unspecified: Secondary | ICD-10-CM | POA: Insufficient documentation

## 2015-12-08 DIAGNOSIS — Z87891 Personal history of nicotine dependence: Secondary | ICD-10-CM | POA: Diagnosis not present

## 2015-12-08 DIAGNOSIS — I2510 Atherosclerotic heart disease of native coronary artery without angina pectoris: Secondary | ICD-10-CM | POA: Diagnosis not present

## 2015-12-08 DIAGNOSIS — R609 Edema, unspecified: Secondary | ICD-10-CM

## 2015-12-08 DIAGNOSIS — E119 Type 2 diabetes mellitus without complications: Secondary | ICD-10-CM | POA: Diagnosis not present

## 2015-12-08 DIAGNOSIS — I10 Essential (primary) hypertension: Secondary | ICD-10-CM | POA: Diagnosis not present

## 2015-12-08 LAB — CBC WITH DIFFERENTIAL/PLATELET
Basophils Absolute: 0 10*3/uL (ref 0.0–0.1)
Basophils Relative: 0 %
EOS PCT: 6 %
Eosinophils Absolute: 0.6 10*3/uL (ref 0.0–0.7)
HCT: 34 % — ABNORMAL LOW (ref 36.0–46.0)
Hemoglobin: 10.9 g/dL — ABNORMAL LOW (ref 12.0–15.0)
LYMPHS ABS: 4.2 10*3/uL — AB (ref 0.7–4.0)
LYMPHS PCT: 48 %
MCH: 25.1 pg — AB (ref 26.0–34.0)
MCHC: 32.1 g/dL (ref 30.0–36.0)
MCV: 78.3 fL (ref 78.0–100.0)
MONO ABS: 0.8 10*3/uL (ref 0.1–1.0)
Monocytes Relative: 8 %
Neutro Abs: 3.5 10*3/uL (ref 1.7–7.7)
Neutrophils Relative %: 38 %
PLATELETS: 243 10*3/uL (ref 150–400)
RBC: 4.34 MIL/uL (ref 3.87–5.11)
RDW: 15 % (ref 11.5–15.5)
WBC: 9 10*3/uL (ref 4.0–10.5)

## 2015-12-08 LAB — BASIC METABOLIC PANEL
Anion gap: 7 (ref 5–15)
BUN: 24 mg/dL — AB (ref 6–20)
CALCIUM: 8.3 mg/dL — AB (ref 8.9–10.3)
CO2: 26 mmol/L (ref 22–32)
Chloride: 104 mmol/L (ref 101–111)
Creatinine, Ser: 1.19 mg/dL — ABNORMAL HIGH (ref 0.44–1.00)
GFR calc Af Amer: 56 mL/min — ABNORMAL LOW (ref >60)
GFR calc non Af Amer: 49 mL/min — ABNORMAL LOW (ref >60)
GLUCOSE: 144 mg/dL — AB (ref 65–99)
Potassium: 3.2 mmol/L — ABNORMAL LOW (ref 3.5–5.1)
Sodium: 137 mmol/L (ref 135–145)

## 2015-12-08 LAB — BRAIN NATRIURETIC PEPTIDE: B NATRIURETIC PEPTIDE 5: 81.5 pg/mL (ref 0.0–100.0)

## 2015-12-08 LAB — TROPONIN I

## 2015-12-08 MED ORDER — GI COCKTAIL ~~LOC~~
30.0000 mL | Freq: Once | ORAL | Status: AC
  Administered 2015-12-08: 30 mL via ORAL
  Filled 2015-12-08: qty 30

## 2015-12-08 MED ORDER — POTASSIUM CHLORIDE CRYS ER 20 MEQ PO TBCR
40.0000 meq | EXTENDED_RELEASE_TABLET | Freq: Once | ORAL | Status: AC
  Administered 2015-12-08: 40 meq via ORAL
  Filled 2015-12-08: qty 2

## 2015-12-08 NOTE — Telephone Encounter (Signed)
She has been having a real concern- recommend-  Schedule PFT, echocardiogram, D-dimer, and FENO (our office, outpatient)   Dx dyspnea

## 2015-12-08 NOTE — ED Notes (Signed)
Pt states she has had SOB off and on for the last several weeks

## 2015-12-08 NOTE — ED Provider Notes (Signed)
CSN: [id number]     Arrival date & time 12/08/15  0148 History   First MD Initiated Contact with Patient 12/08/15 0150     Chief Complaint  Patient presents with  . Shortness of Breath     (Consider location/radiation/quality/duration/timing/severity/associated sxs/prior Treatment) Patient is a 60 y.o. female presenting with shortness of breath. The history is provided by the patient.  Shortness of Breath Severity:  Moderate Onset quality:  Gradual Timing:  Constant Progression:  Waxing and waning Chronicity:  Recurrent Context: not URI   Relieved by:  Nothing Worsened by:  Nothing tried Ineffective treatments:  Diuretics Associated symptoms: no abdominal pain, no chest pain, no claudication, no cough, no diaphoresis, no fever and no neck pain   Risk factors: no prolonged immobilization and no recent surgery     Past Medical History  Diagnosis Date  . Unspecified hypothyroidism   . Cough   . Acute bronchitis   . Obstructive sleep apnea (adult) (pediatric)   . Coronary atherosclerosis   . Anginal pain ([place])   . Hypertension   . Type II or unspecified type diabetes mellitus without mention of complication, not stated as uncontrolled     type 2  . Arthritis     osteo  . Anemia     hx of anemia   Past Surgical History  Procedure Laterality Date  . Tubal ligation    . Anterior fusion cervical spine    . Wisdom tooth extraction    . Left heart catheterization with coronary angiogram N/A 05/29/2014    Procedure: LEFT HEART CATHETERIZATION WITH CORONARY ANGIOGRAM;  Surgeon: Sinclair Grooms, MD;  Location: [facility] CATH LAB;  Service: Cardiovascular;  Laterality: N/A;   Family History  Problem Relation Age of Onset  . Hypertension Mother   . Heart disease Mother   . Prostate cancer Father   . Heart disease Brother   . Heart attack Sister   . Stroke Mother    Social History  Substance Use Topics  . Smoking status: Former Smoker -- 0.10 packs/day for 30 years    Quit  date: 08/09/2003  . Smokeless tobacco: Never Used  . Alcohol Use: No   OB History    No data available     Review of Systems  Constitutional: Negative for fever and diaphoresis.  Respiratory: Positive for shortness of breath. Negative for cough and chest tightness.   Cardiovascular: Negative for chest pain, palpitations and claudication.  Gastrointestinal: Negative for abdominal pain.  Musculoskeletal: Negative for neck pain.  All other systems reviewed and are negative.     Allergies  Codeine; Levaquin; Sulfonamide derivatives; and Sulfa antibiotics  Home Medications   Prior to Admission medications   Medication Sig Start Date End Date Taking? Authorizing Provider  ADVAIR DISKUS 250-50 MCG/DOSE AEPB INHALE ONE (1) PUFF(S) TWICE DAILY, RINSE MOUTH. 04/16/15   Deneise Lever, MD  albuterol (PROVENTIL HFA) 108 (90 BASE) MCG/ACT inhaler Inhale 2 puffs into the lungs every 6 (six) hours as needed for wheezing or shortness of breath. For shortness of breath and wheezing 11/08/11 09/14/15  Deneise Lever, MD  albuterol (PROVENTIL) (2.5 MG/3ML) 0.083% nebulizer solution Take 3 mLs (2.5 mg total) by nebulization every 6 (six) hours as needed for wheezing or shortness of breath. 06/30/15 02/07/18  Deneise Lever, MD  amoxicillin (AMOXIL) 500 MG tablet Take 1 tablet (500 mg total) by mouth 3 (three) times daily. 11/19/15   Deneise Lever, MD  amoxicillin-clavulanate (AUGMENTIN) 875-125 MG  tablet Take 1 tablet by mouth 2 (two) times daily. 09/14/15   Deneise Lever, MD  aspirin 81 MG chewable tablet Chew 1 tablet (81 mg total) by mouth daily. 05/30/14   Evelene Croon Barrett, PA-C  atorvastatin (LIPITOR) 80 MG tablet Take 1 tablet Monday, Wednesday, and Friday 09/11/15   Eileen Stanford, PA-C  clopidogrel (PLAVIX) 75 MG tablet Take 1 tablet (75 mg total) by mouth daily. 09/11/15   Eileen Stanford, PA-C  diazepam (VALIUM) 5 MG tablet Take 0.5 tablets by mouth as needed. .5 to 1 tablet prn for muscle  cramps 10/28/11   Historical Provider, MD  diclofenac (VOLTAREN) 75 MG EC tablet Take 75 mg by mouth daily as needed. For muscle aches    Historical Provider, MD  fluconazole (DIFLUCAN) 150 MG tablet Take 1 tablet (150 mg total) by mouth daily. 09/14/15   Deneise Lever, MD  HYDROcodone-homatropine (HYCODAN) 5-1.5 MG/5ML syrup Take 5 mLs by mouth every 6 (six) hours as needed for cough. 11/19/15   Deneise Lever, MD  Insulin Detemir (LEVEMIR FLEXPEN [place]) Inject 15 Units into the skin at bedtime.     Historical Provider, MD  INVOKANA 100 MG TABS Take 100 mg by mouth daily before breakfast.  07/03/13   Historical Provider, MD  ipratropium (ATROVENT) 0.02 % nebulizer solution Take 2.5 mLs (0.5 mg total) by nebulization 4 (four) times daily. 07/12/15   Charlesetta Shanks, MD  levothyroxine (SYNTHROID, LEVOTHROID) 200 MCG tablet Take 200 mcg by mouth daily.    Historical Provider, MD  loratadine (CLARITIN) 10 MG tablet Take 1 tablet (10 mg total) by mouth daily. 09/23/15   Julianne Rice, MD  metFORMIN (GLUMETZA) 1000 MG (MOD) 24 hr tablet Take 1 tablet (1,000 mg total) by mouth 2 (two) times daily with a meal. HOLD for 48 hours, restart on 06/01/2014. 05/30/14   Evelene Croon Barrett, PA-C  metoprolol succinate (TOPROL-XL) 25 MG 24 hr tablet Take 1 tablet (25 mg total) by mouth daily. 09/11/15   Eileen Stanford, PA-C  NOVOTWIST 32G X 5 MM MISC Use as directed 05/04/11   Historical Provider, MD  omega-3 acid ethyl esters (LOVAZA) 1 g capsule TAKE TWO CAPSULES BY MOUTH TWICE A DAY 09/17/15   Troy Sine, MD  ONE TOUCH ULTRA TEST test strip Use as directed 05/05/11   Historical Provider, MD  triamterene-hydrochlorothiazide (MAXZIDE-25) 37.5-25 MG per tablet Take 1 tablet by mouth daily. 05/29/14   Historical Provider, MD  verapamil (CALAN-SR) 240 MG CR tablet Take 1 tablet by mouth daily. 05/26/14   Historical Provider, MD   BP 146/80 mmHg  Pulse 60  Temp(Src) 97.8 F (36.6 C) (Oral)  Resp 18  Ht 5\' 2"  (1.575 m)   Wt 168 lb (76.204 kg)  BMI 30.72 kg/m2  SpO2 98% Physical Exam  Constitutional: She is oriented to person, place, and time. She appears well-developed and well-nourished. No distress.  HENT:  Head: Normocephalic and atraumatic.  Mouth/Throat: Oropharynx is clear and moist.  Eyes: Conjunctivae are normal. Pupils are equal, round, and reactive to light.  Neck: Normal range of motion. Neck supple.  Cardiovascular: Normal rate, regular rhythm and intact distal pulses.   Pulmonary/Chest: Effort normal and breath sounds normal. No stridor. No respiratory distress. She has no wheezes. She has no rales.  Abdominal: Soft. Bowel sounds are increased. There is no tenderness. There is no rigidity, no rebound, no guarding, no tenderness at McBurney's point and negative Murphy's sign.  Musculoskeletal: Normal range  of motion. She exhibits no edema or tenderness.  Neurological: She is alert and oriented to person, place, and time. She has normal reflexes.  Skin: Skin is warm and dry.  Psychiatric: She has a normal mood and affect.    ED Course  Procedures (including critical care time) Labs Review Labs Reviewed  CBC WITH DIFFERENTIAL/PLATELET  BASIC METABOLIC PANEL  TROPONIN I  BRAIN NATRIURETIC PEPTIDE    Imaging Review No results found. I have personally reviewed and evaluated these images and lab results as part of my medical decision-making.   EKG Interpretation None      MDM   Final diagnoses:  None   Filed Vitals:   12/08/15 0151  BP: 146/80  Pulse: 60  Temp: 97.8 F (36.6 C)  Resp: 18    EKG Interpretation  Date/Time:  Tuesday Dec 08 2015 02:13:30 EDT Ventricular Rate:  56 PR Interval:  167 QRS Duration: 94 QT Interval:  474 QTC Calculation: 457 R Axis:   46 Text Interpretation:  Sinus rhythm Confirmed by [facility]  MD, Sherrita Riederer ([id number]) on 12/08/2015 2:19:50 AM        Results for orders placed or performed during the hospital encounter of 12/08/15  CBC with  Differential/Platelet  Result Value Ref Range   WBC 9.0 4.0 - 10.5 K/uL   RBC 4.34 3.87 - 5.11 MIL/uL   Hemoglobin 10.9 (L) 12.0 - 15.0 g/dL   HCT 34.0 (L) 36.0 - 46.0 %   MCV 78.3 78.0 - 100.0 fL   MCH 25.1 (L) 26.0 - 34.0 pg   MCHC 32.1 30.0 - 36.0 g/dL   RDW 15.0 11.5 - 15.5 %   Platelets 243 150 - 400 K/uL   Neutrophils Relative % 38 %   Neutro Abs 3.5 1.7 - 7.7 K/uL   Lymphocytes Relative 48 %   Lymphs Abs 4.2 (H) 0.7 - 4.0 K/uL   Monocytes Relative 8 %   Monocytes Absolute 0.8 0.1 - 1.0 K/uL   Eosinophils Relative 6 %   Eosinophils Absolute 0.6 0.0 - 0.7 K/uL   Basophils Relative 0 %   Basophils Absolute 0.0 0.0 - 0.1 K/uL  Basic metabolic panel  Result Value Ref Range   Sodium 137 135 - 145 mmol/L   Potassium 3.2 (L) 3.5 - 5.1 mmol/L   Chloride 104 101 - 111 mmol/L   CO2 26 22 - 32 mmol/L   Glucose, Bld 144 (H) 65 - 99 mg/dL   BUN 24 (H) 6 - 20 mg/dL   Creatinine, Ser 1.19 (H) 0.44 - 1.00 mg/dL   Calcium 8.3 (L) 8.9 - 10.3 mg/dL   GFR calc non Af Amer 49 (L) >60 mL/min   GFR calc Af Amer 56 (L) >60 mL/min   Anion gap 7 5 - 15  Troponin I  Result Value Ref Range   Troponin I <0.03 <0.031 ng/mL  Brain natriuretic peptide  Result Value Ref Range   B Natriuretic Peptide 81.5 0.0 - 100.0 pg/mL   Dg Chest 2 View  12/08/2015  CLINICAL DATA:  Intermittent dyspnea over the past several weeks. EXAM: CHEST  2 VIEW COMPARISON:  09/23/2015 FINDINGS: Borderline cardiomegaly, unchanged. The lungs are clear. The pulmonary vasculature is normal. No pleural effusion. IMPRESSION: No active cardiopulmonary disease. Electronically Signed   By: Andreas Newport M.D.   On: 12/08/2015 02:17    Medications  gi cocktail (Maalox,Lidocaine,Donnatal) (30 mLs Oral Given 12/08/15 0225)  potassium chloride SA (K-DUR,KLOR-CON) CR tablet 40 mEq (40 mEq Oral  Given 12/08/15 0253)    I suspect this is actually GERD related as it happens laying flat and happened tonight without any fluid on board. She  does not take her Protonix for GERD as she was directed. I think she is a bit anxious and keeps taking lasix which I told her is not a good thing as it is driving up her BUN and creatinine and contributing to her hypokalemia.  Start retaking your Protonix adhere to a GERD friendly diet and her swelling which is not present now is likely dependent in nature.  Will write for compression stockings.  Follow up with your PMD this week.  Strict return precautions given  Mylinda Brook, MD 12/08/15 [id number]

## 2015-12-08 NOTE — Telephone Encounter (Signed)
Spoke with [name] to clarify the two messages below-pt is coming in tomorrow for d-dimer (order placed), and is coming in Thursday at 11:15 for acute ov with CY. Per [name] the pft and echo can be scheduled at this visit.  Forwarding back to KW to make aware for Thursday.  Nothing further needed.

## 2015-12-08 NOTE — Telephone Encounter (Signed)
Spoke with pt, states feels "like she is being smothered", chest tightness, anxiety from shortness of breath, nonprod cough, difficulty falling asleep.  Pt has used nebulizer treatment which helped.  Pt went to ED this morning for this, was told that her lungs were clear.   Denies mucus production, fever, chills.   Pt requesting further recs.    Last ov: 09/14/15 Next ov: 01/27/16  CY please advise.  Thanks!   Allergies  Allergen Reactions  . Codeine     REACTION: hallucinations/"loopy"  . Levaquin [Levofloxacin] Nausea And Vomiting  . Sulfonamide Derivatives Hives  . Sulfa Antibiotics Hives   Current Outpatient Prescriptions on File Prior to Visit  Medication Sig Dispense Refill  . ADVAIR DISKUS 250-50 MCG/DOSE AEPB INHALE ONE (1) PUFF(S) TWICE DAILY, RINSE MOUTH. 60 each 6  . albuterol (PROVENTIL HFA) 108 (90 BASE) MCG/ACT inhaler Inhale 2 puffs into the lungs every 6 (six) hours as needed for wheezing or shortness of breath. For shortness of breath and wheezing 1 Inhaler prn  . albuterol (PROVENTIL) (2.5 MG/3ML) 0.083% nebulizer solution Take 3 mLs (2.5 mg total) by nebulization every 6 (six) hours as needed for wheezing or shortness of breath. 75 mL 5  . amoxicillin (AMOXIL) 500 MG tablet Take 1 tablet (500 mg total) by mouth 3 (three) times daily. 21 tablet 0  . amoxicillin-clavulanate (AUGMENTIN) 875-125 MG tablet Take 1 tablet by mouth 2 (two) times daily. 20 tablet 0  . aspirin 81 MG chewable tablet Chew 1 tablet (81 mg total) by mouth daily.    Marland Kitchen atorvastatin (LIPITOR) 80 MG tablet Take 1 tablet Monday, Wednesday, and Friday 36 tablet 3  . clopidogrel (PLAVIX) 75 MG tablet Take 1 tablet (75 mg total) by mouth daily. 90 tablet 3  . diazepam (VALIUM) 5 MG tablet Take 0.5 tablets by mouth as needed. .5 to 1 tablet prn for muscle cramps    . diclofenac (VOLTAREN) 75 MG EC tablet Take 75 mg by mouth daily as needed. For muscle aches    . fluconazole (DIFLUCAN) 150 MG tablet Take 1  tablet (150 mg total) by mouth daily. 7 tablet 1  . HYDROcodone-homatropine (HYCODAN) 5-1.5 MG/5ML syrup Take 5 mLs by mouth every 6 (six) hours as needed for cough. 240 mL 0  . Insulin Detemir (LEVEMIR FLEXPEN ) Inject 15 Units into the skin at bedtime.     . INVOKANA 100 MG TABS Take 100 mg by mouth daily before breakfast.     . ipratropium (ATROVENT) 0.02 % nebulizer solution Take 2.5 mLs (0.5 mg total) by nebulization 4 (four) times daily. 75 mL 12  . levothyroxine (SYNTHROID, LEVOTHROID) 200 MCG tablet Take 200 mcg by mouth daily.    Marland Kitchen loratadine (CLARITIN) 10 MG tablet Take 1 tablet (10 mg total) by mouth daily. 30 tablet 0  . metFORMIN (GLUMETZA) 1000 MG (MOD) 24 hr tablet Take 1 tablet (1,000 mg total) by mouth 2 (two) times daily with a meal. HOLD for 48 hours, restart on 06/01/2014.    . metoprolol succinate (TOPROL-XL) 25 MG 24 hr tablet Take 1 tablet (25 mg total) by mouth daily. 90 tablet 3  . NOVOTWIST 32G X 5 MM MISC Use as directed    . omega-3 acid ethyl esters (LOVAZA) 1 g capsule TAKE TWO CAPSULES BY MOUTH TWICE A DAY 180 capsule 3  . ONE TOUCH ULTRA TEST test strip Use as directed    . triamterene-hydrochlorothiazide (MAXZIDE-25) 37.5-25 MG per tablet Take 1 tablet by mouth  daily.    . verapamil (CALAN-SR) 240 MG CR tablet Take 1 tablet by mouth daily.     No current facility-administered medications on file prior to visit.

## 2015-12-08 NOTE — ED Notes (Signed)
MD at bedside. 

## 2015-12-08 NOTE — ED Notes (Signed)
Patient transported to X-ray 

## 2015-12-08 NOTE — Telephone Encounter (Signed)
Sorry for any miscommunication-PFT([place] as we are booked here) and ECHO order need to be placed today please.

## 2015-12-08 NOTE — Telephone Encounter (Signed)
Complete

## 2015-12-08 NOTE — Telephone Encounter (Signed)
Orders entered per Katie's request. Nothing further needed.

## 2015-12-08 NOTE — Telephone Encounter (Signed)
Please advise if the ECHO order needs to be placed with or without Enhancement, Limited or Complete?

## 2015-12-08 NOTE — Telephone Encounter (Signed)
Okay to add patient in RNA slot as OV per CY for [place]. Thanks.

## 2015-12-08 NOTE — ED Notes (Signed)
Pt states she took a lasix at 5pm and one at 68mid due to feeling like feet where swelling.

## 2015-12-09 ENCOUNTER — Ambulatory Visit: Payer: 59 | Attending: Orthopedic Surgery | Admitting: Occupational Therapy

## 2015-12-09 ENCOUNTER — Other Ambulatory Visit: Payer: 59

## 2015-12-09 ENCOUNTER — Encounter: Payer: Self-pay | Admitting: Occupational Therapy

## 2015-12-09 ENCOUNTER — Telehealth: Payer: Self-pay | Admitting: Internal Medicine

## 2015-12-09 DIAGNOSIS — R293 Abnormal posture: Secondary | ICD-10-CM | POA: Insufficient documentation

## 2015-12-09 DIAGNOSIS — M6281 Muscle weakness (generalized): Secondary | ICD-10-CM

## 2015-12-09 DIAGNOSIS — M542 Cervicalgia: Secondary | ICD-10-CM

## 2015-12-09 DIAGNOSIS — R0600 Dyspnea, unspecified: Secondary | ICD-10-CM

## 2015-12-09 DIAGNOSIS — M25512 Pain in left shoulder: Secondary | ICD-10-CM | POA: Insufficient documentation

## 2015-12-09 DIAGNOSIS — R079 Chest pain, unspecified: Secondary | ICD-10-CM

## 2015-12-09 DIAGNOSIS — M25511 Pain in right shoulder: Secondary | ICD-10-CM | POA: Diagnosis present

## 2015-12-09 NOTE — Therapy (Signed)
[facility] [address]  [place], [name], [address] Phone: [phone]   Fax:  [phone]  Occupational Therapy Evaluation  Patient Details  [name] MRN: [id number] Date of Birth: 1956/02/09 Referring Provider: Dr. Amedeo Plenty  Encounter Date: 12/09/2015      OT End of Session - 12/09/15 1121    Visit Number 1   Number of Visits 9   Date for OT Re-Evaluation 01/09/16   Authorization Type UHC/TRICARE   Authorization - Visit Number 1   Authorization - Number of Visits 10   OT Start Time [id number]   OT Stop Time 1110   OT Time Calculation (min) 52 min   Activity Tolerance Patient tolerated treatment well      Past Medical History  Diagnosis Date  . Unspecified hypothyroidism   . Cough   . Acute bronchitis   . Obstructive sleep apnea (adult) (pediatric)   . Coronary atherosclerosis   . Anginal pain ([place])   . Hypertension   . Type II or unspecified type diabetes mellitus without mention of complication, not stated as uncontrolled     type 2  . Arthritis     osteo  . Anemia     hx of anemia    Past Surgical History  Procedure Laterality Date  . Tubal ligation    . Anterior fusion cervical spine    . Wisdom tooth extraction    . Left heart catheterization with coronary angiogram N/A 05/29/2014    Procedure: LEFT HEART CATHETERIZATION WITH CORONARY ANGIOGRAM;  Surgeon: Sinclair Grooms, MD;  Location: [facility] CATH LAB;  Service: Cardiovascular;  Laterality: N/A;    There were no vitals filed for this visit.      Subjective Assessment - 12/09/15 1029    Subjective  My Rt shoulder is worse now   Pertinent History Anterior cervical fusion 2009, Rt rotator cuff tear (chronic)    Patient Stated Goals reduce pain   Currently in Pain? Yes   Pain Score 8    Pain Location Shoulder  and neck   Pain Orientation Right   Pain Descriptors / Indicators Aching   Pain Type Chronic pain   Pain Frequency  Constant   Aggravating Factors  driving bus, overuse, worse at night   Pain Relieving Factors rest, heat           [facility] OT Assessment - 12/09/15 0001    Assessment   Diagnosis cervicalagia, chronic bilateral shoulder pain  Rt shoulder is worse now d/t rotator cuff tear   Referring Provider Dr. Amedeo Plenty   Onset Date --  2009   Prior Therapy outpatient P.T. 2016   Precautions   Precautions None   Balance Screen   Has the patient fallen in the past 6 months No   Has the patient had a decrease in activity level because of a fear of falling?  No   Is the patient reluctant to leave their home because of a fear of falling?  No   Home  Environment   Bathroom Shower/Tub Tub/Shower unit;Curtain   Additional Comments Lives in 2 story home.    Lives With --  SISTER   Prior Function   Level of Independence Independent   Vocation Full time employment   Vocation Requirements school bus driver   Leisure enjoys yardwork, enjoys walking   ADL   Eating/Feeding Independent   Grooming Independent   Scientist, clinical (histocompatibility and immunogenetics) Independent   Lower Body  Bathing Independent   Upper Body Dressing Independent   Lower Body Dressing Independent   Engineering geologist - Water engineer -  Development worker, community Independent   IADL   Shopping Takes care of all shopping needs independently   Light Housekeeping --  independent   Meal Prep Plans, prepares and serves adequate meals independently   Programmer, applications own vehicle   Medication Management Is responsible for taking medication in correct dosages at correct time   Mobility   Mobility Status Independent   Written Expression   Dominant Hand Right   Handwriting --  denies change   Vision - History   Baseline Vision Wears glasses for distance only   Additional Comments denies change   Observation/Other Assessments   Observations Pt slightly kyphotic posture with forward  head. Pt able to perform trunk extension, but unable to perform cervical retraction. Pt also with Lt elevated scapula and noted tightness in upper traps   Quick DASH  39% disability   Sensation   Additional Comments intact, only reports numbness occasionally d/t positioning at night   Coordination   9 Hole Peg Test Right;Left   Right 9 Hole Peg Test 29.56 sec   Left 9 Hole Peg Test 24.06 sec   Edema   Edema mild Rt hand d/t OA   ROM / Strength   AROM / PROM / Strength AROM   AROM   Right Shoulder Flexion 160 Degrees   Right Shoulder ABduction 158 Degrees   Right Shoulder Internal Rotation --  WFL's   Right Shoulder External Rotation --  WFL's   Left Shoulder Flexion 165 Degrees   Left Shoulder ABduction 165 Degrees   Left Shoulder Internal Rotation --  WFL's   Left Shoulder External Rotation --  WFL's   Cervical - Right Rotation --  WFL's   Cervical - Left Rotation --  limited slightly with pain   Strength   Right/Left Shoulder Right   Right Shoulder Flexion 4/5   Right Shoulder ABduction 4-/5   Right Shoulder Internal Rotation 4+/5   Right Shoulder External Rotation 4/5   Left Shoulder Flexion 4/5   Left Shoulder ABduction 4-/5   Left Shoulder Internal Rotation 5/5   Left Shoulder External Rotation 4/5   Hand Function   Right Hand Grip (lbs) 52   Left Hand Grip (lbs) 50                         OT Education - 12/09/15 1118    Education provided Yes   Education Details bed positioning at night to decr. pain Rt shoulder   Person(s) Educated Patient   Methods Explanation   Comprehension Verbalized understanding             OT Long Term Goals - 12/09/15 1126    OT LONG TERM GOAL #1   Title Independent with updated HEP (All LTG's due 01/08/16)    Time 4   Period Weeks   Status New   OT LONG TERM GOAL #2   Title Pt to report pain less than or equal to 5/10 with work related tasks and lesiure activities   Baseline 8/10   Time 4   Period  Weeks   Status New   OT LONG TERM GOAL #3   Title Pt to decrease disability from 38% to 25% or less on Quick Dash   Time 4  Period Weeks   Status New               Plan - 12/09/15 1122    Clinical Impression Statement Pt is a 60 y.o. female who presents to outpatient rehab with chronic bilateral shoulder pain (Rt worse than Lt with current admission) and cervicalgia. Pt with overall increased pain and decreased strength limiting work and leisure activities.    Rehab Potential Good   Clinical Impairments Affecting Rehab Potential chronic (time since onset)   OT Frequency 2x / week   OT Duration 4 weeks  plus evaluation   OT Treatment/Interventions Therapeutic exercise;Patient/family education;Neuromuscular education;Ultrasound;Manual Therapy;DME and/or AE instruction;Cryotherapy;Therapeutic activities;Electrical Stimulation;Moist Heat;Passive range of motion   Plan postural exercises, ultrasound, ? taping for upper traps and ER   Consulted and Agree with Plan of Care Patient      Patient will benefit from skilled therapeutic intervention in order to improve the following deficits and impairments:  Decreased range of motion, Improper spinal/pelvic alignment, Impaired UE functional use, Pain, Decreased strength  Visit Diagnosis: Pain in right shoulder - Plan: Ot plan of care cert/re-cert  Pain in left shoulder - Plan: Ot plan of care cert/re-cert  Cervicalgia - Plan: Ot plan of care cert/re-cert  Abnormal posture - Plan: Ot plan of care cert/re-cert  Muscle weakness (generalized) - Plan: Ot plan of care cert/re-cert      G-Codes - [id number] November 26, 1127    Functional Assessment Tool Used Quick Dash 38%   Functional Limitation Carrying, moving and handling objects   Carrying, Moving and Handling Objects Current Status [id number]) At least 20 percent but less than 40 percent impaired, limited or restricted   Carrying, Moving and Handling Objects Goal Status [id number]) At least 1 percent  but less than 20 percent impaired, limited or restricted      Problem List Patient Active Problem List   Diagnosis Date Noted  . Acute bronchitis 08/10/2014  . Unstable angina ([place]) 05/29/2014  . Abnormal nuclear stress test 05/29/2014  . Chest pain 05/06/2014  . Exertional shortness of breath 05/06/2014  . Mixed hyperlipidemia 05/06/2014  . Hyperlipidemia with target LDL less than 70 08/05/2013  . HTN (hypertension) 08/05/2013  . Chest wall pain 08/05/2013  . SINUSITIS, ACUTE 04/07/2009  . HYPOTHYROIDISM 12/10/2007  . DIABETES MELLITUS, TYPE II 05/28/2007  . Obstructive sleep apnea 05/28/2007  . Coronary atherosclerosis 05/28/2007  . Seasonal and perennial allergic rhinitis 05/28/2007  . COUGH, CHRONIC 05/28/2007    Carey Bullocks, OTR/L  12/09/2015, 11:31 AM  [facility] [address]  [place], [name], [address] Phone: [phone]   Fax:  [phone]  Name: LASHIA FREELAND MRN: [id number] Date of Birth: [birth year]

## 2015-12-09 NOTE — Telephone Encounter (Signed)
Spoke with pt and informed that I spoke with [facility] lab and they cannot add the D Dimer to the tests that were drawn there yesterday.  Pt voiced understanding and will come here for labs today.

## 2015-12-10 ENCOUNTER — Ambulatory Visit: Payer: 59 | Admitting: Internal Medicine

## 2015-12-10 LAB — D-DIMER, QUANTITATIVE: D-Dimer, Quant: 0.42 ug/mL-FEU (ref 0.00–0.48)

## 2015-12-11 ENCOUNTER — Encounter: Payer: Self-pay | Admitting: Internal Medicine

## 2015-12-11 ENCOUNTER — Telehealth: Payer: Self-pay | Admitting: Internal Medicine

## 2015-12-11 NOTE — Telephone Encounter (Signed)
Spoke with pt. States that she was told by someone here at our office she needed to come see CY today for an appointment. She could not tell who she spoke to. There is no documentation of this conversation. She has been made aware of her D-dimer results. Pt will keep her appointment with CY in June. PT WAS NOT IN ANY DISTRESS that warranted an appointment with CY.

## 2015-12-14 ENCOUNTER — Ambulatory Visit (INDEPENDENT_AMBULATORY_CARE_PROVIDER_SITE_OTHER): Payer: 59 | Admitting: Internal Medicine

## 2015-12-14 ENCOUNTER — Encounter: Payer: Self-pay | Admitting: Occupational Therapy

## 2015-12-14 ENCOUNTER — Encounter: Payer: Self-pay | Admitting: Internal Medicine

## 2015-12-14 ENCOUNTER — Ambulatory Visit: Payer: 59 | Admitting: Occupational Therapy

## 2015-12-14 VITALS — BP 112/60 | HR 64 | Ht 62.0 in | Wt 170.6 lb

## 2015-12-14 DIAGNOSIS — M25511 Pain in right shoulder: Secondary | ICD-10-CM

## 2015-12-14 DIAGNOSIS — M25512 Pain in left shoulder: Secondary | ICD-10-CM

## 2015-12-14 DIAGNOSIS — J309 Allergic rhinitis, unspecified: Secondary | ICD-10-CM

## 2015-12-14 DIAGNOSIS — R293 Abnormal posture: Secondary | ICD-10-CM

## 2015-12-14 DIAGNOSIS — M542 Cervicalgia: Secondary | ICD-10-CM

## 2015-12-14 MED ORDER — PHENYLEPHRINE HCL 1 % NA SOLN
3.0000 [drp] | Freq: Once | NASAL | Status: AC
  Administered 2015-12-14: 3 [drp] via NASAL

## 2015-12-14 MED ORDER — UMECLIDINIUM BROMIDE 62.5 MCG/INH IN AEPB: 1.0000 | INHALATION_SPRAY | Freq: Every day | RESPIRATORY_TRACT | Status: DC

## 2015-12-14 MED ORDER — METHYLPREDNISOLONE ACETATE 80 MG/ML IJ SUSP
80.0000 mg | Freq: Once | INTRAMUSCULAR | Status: AC
  Administered 2015-12-14: 80 mg via INTRAMUSCULAR

## 2015-12-14 MED ORDER — ESZOPICLONE 2 MG PO TABS: 2.0000 mg | ORAL_TABLET | Freq: Every evening | ORAL | Status: DC | PRN

## 2015-12-14 NOTE — Patient Instructions (Addendum)
Keep appointments for PFT and Echocardiogram  If your breathing problems keep up, I would like you to go b ack to see Dr Claiborne Billings for cardiology  Instead of Advair, try sample and printed script for Incruse    Inhale 1 puff, once daily   See if it helps you breathe and with a little less stimulant feeling.  For insomnia try lunesta script    1 at bedtime if needed for sleep  Nasal neb neo       Dx allergic rhinitis  Depo 80             "  Keep June 21 appointment with Dr Annamaria Boots

## 2015-12-14 NOTE — Patient Instructions (Signed)
Scapular Retraction (Prone)    Lie with arms at sides. Pinch shoulder blades together and raise arms a few inches from floor. Repeat _10___ times per set, holding 5 sec. . Do __1__ sets per session. Do _2-3___ sessions per day.  http://orth.exer.us/955   Shoulder (Scapula) Retraction    Pull shoulders back, squeezing shoulder blades together. Repeat __10__ times per session. Do _several times throughout day    Axial Extension (Chin Tuck)    Pull chin in and lengthen back of neck. Hold __5__ seconds while counting out loud. Repeat __10__ times. Do _2-3___ sessions per day.  http://gt2.exer.us/450    TOWEL STRETCH: lie on back on floor or firm bed with towel roll vertical (length ways) for 10 minutes at least 1x/day

## 2015-12-14 NOTE — Telephone Encounter (Signed)
Spoke with pt on the phone. She has been scheduled to see CY today at 11:15am. Someone had canceled for this time today. Nothing further was needed.

## 2015-12-14 NOTE — Therapy (Signed)
[facility] [address]  [place], [name], [address] Phone: [phone]   Fax:  [phone]  Occupational Therapy Treatment  Patient Details  [name] MRN: [id number] Date of Birth: 1955/08/29 Referring Provider: Dr. Amedeo Plenty  Encounter Date: 12/14/2015      OT End of Session - 12/14/15 1034    Visit Number 2   Number of Visits 9   Date for OT Re-Evaluation 01/09/16   Authorization Type UHC/TRICARE   Authorization - Visit Number 2   Authorization - Number of Visits 10   OT Start Time 1005   OT Stop Time 52  Pt had to leave early for another appointment in [place]   OT Time Calculation (min) 25 min   Activity Tolerance Patient tolerated treatment well      Past Medical History  Diagnosis Date  . Unspecified hypothyroidism   . Cough   . Acute bronchitis   . Obstructive sleep apnea (adult) (pediatric)   . Coronary atherosclerosis   . Anginal pain ([place])   . Hypertension   . Type II or unspecified type diabetes mellitus without mention of complication, not stated as uncontrolled     type 2  . Arthritis     osteo  . Anemia     hx of anemia    Past Surgical History  Procedure Laterality Date  . Tubal ligation    . Anterior fusion cervical spine    . Wisdom tooth extraction    . Left heart catheterization with coronary angiogram N/A 05/29/2014    Procedure: LEFT HEART CATHETERIZATION WITH CORONARY ANGIOGRAM;  Surgeon: Sinclair Grooms, MD;  Location: [facility] CATH LAB;  Service: Cardiovascular;  Laterality: N/A;    There were no vitals filed for this visit.      Subjective Assessment - 12/14/15 1007    Subjective  I slept on it wrong last night. But the positioning on my Lt side has helped   Pertinent History Anterior cervical fusion 2009, Rt rotator cuff tear (chronic)    Patient Stated Goals reduce pain   Currently in Pain? Yes   Pain Score 8    Pain Location Shoulder  upper traps   Pain Orientation Right   Pain Descriptors / Indicators Aching   Pain Type Chronic pain   Pain Frequency Intermittent   Aggravating Factors  driving bus, overuse, worse at night                      OT Treatments/Exercises (OP) - 12/14/15 0001    Exercises   Exercises --  see pt instructions/HEP                OT Education - 12/14/15 1033    Education provided Yes   Education Details Postural HEP (scapula retraction, stretching, and cervical retraction)   Person(s) Educated Patient   Methods Explanation;Demonstration;Handout   Comprehension Verbalized understanding;Returned demonstration             OT Long Term Goals - 12/09/15 1126    OT LONG TERM GOAL #1   Title Independent with updated HEP (All LTG's due 01/08/16)    Time 4   Period Weeks   Status New   OT LONG TERM GOAL #2   Title Pt to report pain less than or equal to 5/10 with work related tasks and lesiure activities   Baseline 8/10   Time 4   Period Weeks  Status New   OT LONG TERM GOAL #3   Title Pt to decrease disability from 38% to 25% or less on Quick Dash   Time 4   Period Weeks   Status New               Plan - 12/14/15 1035    Clinical Impression Statement Pt is aware of posture during work related activities and agrees to work on throughout day   Rehab Potential Good   Clinical Impairments Affecting Rehab Potential chronic (time since onset)   OT Frequency 2x / week   OT Duration 4 weeks   OT Treatment/Interventions Therapeutic exercise;Patient/family education;Neuromuscular education;Ultrasound;Manual Therapy;DME and/or AE instruction;Cryotherapy;Therapeutic activities;Electrical Stimulation;Moist Heat;Passive range of motion   Plan review HEP prn, ultrasound, kinesiotaping to relax upper traps and activate ER and trunk extension, STM's. (progress to strengthening following session)   Consulted and Agree with Plan of Care Patient      Patient will benefit from  skilled therapeutic intervention in order to improve the following deficits and impairments:  Decreased range of motion, Improper spinal/pelvic alignment, Impaired UE functional use, Pain, Decreased strength  Visit Diagnosis: Pain in right shoulder  Pain in left shoulder  Cervicalgia  Abnormal posture    Problem List Patient Active Problem List   Diagnosis Date Noted  . Acute bronchitis 08/10/2014  . Unstable angina ([place]) 05/29/2014  . Abnormal nuclear stress test 05/29/2014  . Chest pain 05/06/2014  . Exertional shortness of breath 05/06/2014  . Mixed hyperlipidemia 05/06/2014  . Hyperlipidemia with target LDL less than 70 08/05/2013  . HTN (hypertension) 08/05/2013  . Chest wall pain 08/05/2013  . SINUSITIS, ACUTE 04/07/2009  . HYPOTHYROIDISM 12/10/2007  . DIABETES MELLITUS, TYPE II 05/28/2007  . Obstructive sleep apnea 05/28/2007  . Coronary atherosclerosis 05/28/2007  . Seasonal and perennial allergic rhinitis 05/28/2007  . COUGH, CHRONIC 05/28/2007    Carey Bullocks, OTR/L 12/14/2015, 10:37 AM  [facility] [address]  [place], [name], [address] Phone: [phone]   Fax:  [phone]  [name] MRN: [id number] Date of Birth: 1956/02/24

## 2015-12-14 NOTE — Progress Notes (Signed)
Patient ID: [name], female    DOB: Jul 06, 1956, 60 y.o.   MRN: [id number]  HPI 11/08/10- 34 yo F former smoker, followed here for obstructive sleep apnea and for hx bronchitis.  She was recently treated by her PCP for acute bronchitis. Onset after mowing grass. Still coughing up thick greenish yellow mucus after augmentin they gave her. She thinks she does better with doxycycline. Denies fever, swollen glands, sore throat.  Last seen here May 10, 2010. Hx of allergy testing but never on vaccine. Pollen is bothering with nasal congestion and wheezey cough more this year.  Continues CPAP at 13 cwp all night every night.  Sleeps ok. She asks about alternatives and we discussed palatal surgeries, oral appliances and Provent nasal valves. [name] Twin sister had MI.  05/10/11- 49 yo F former smoker, followed here for obstructive sleep apnea and for hx bronchitis, allergic rhinitis, complicated by DM, hypothyroid, CAD  Since last here, Dr. Claiborne Billings placed a coronary stent. 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  CPAP compliance is good all night every night. She isn't sure about mask and we discussed going to [facility] to look at alternatives.  Wants flu vac. She still feels tight in the chest that she continues Advair 250. Some wheeze. Uses rescue inhaler and asks about getting a nebulizer machine. We had called in a Z-Pak which she says was not strong enough. She denies discolored sputum or fever. We discussed using cortisone and anticipated impact on her insulin-dependent diabetes temporarily. She complains of a "raw" substernal sensation but thinks Nexium controls her reflux well.  08/29/11-  63 yo F former smoker, followed here for obstructive sleep apnea and for hx bronchitis, allergic rhinitis, complicated by DM, hypothyroid, CAD  Acute visit-  deep cough-yellow in color and streaks of blood at times; SOB and wheezing-went to ER Saturday([facility])Lower  back pain  she did have flu vaccine. Last week had GI upset with vomiting and right upper quadrant pain. She still has her gallbladder. That discomfort has eased off. 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 As her GI upset resolved, she developed chest tightness with cough and cold symptoms, fever and chills 3 days ago now resolved. Head and chest are still congested. [facility] his nebulizer treatment x2 started prednisone taper. She  Is finishing doxycycline with 2 days left. CHEST - 2 VIEW 08/27/11-  Comparison: 05/10/2011  Findings: Mild peribronchial thickening. Heart and mediastinal  contours are within normal limits. No focal opacities or  effusions. No acute bony abnormality.  IMPRESSION:  Mild bronchitic changes.  Original Report Authenticated By: [name], M.D.   11/08/11- 47 yo F former smoker, followed here for obstructive sleep apnea and for hx bronchitis, allergic rhinitis, complicated by DM, hypothyroid, CAD  Continues using CPAP 13 almost every night. No sleeping pills. Nebulizer treatment and Depo-Medrol did help her breathing last visit. She would like a home nebulizer which we discussed. Singulair did not help. Blames her cough on postnasal drip or getting too hot. Has history of occasional thrush which might also contribute to throat discomfort.   05/11/2012 Acute OV  Complains of head congestion w/ green drainage, PND, prod cough with gree mucus, hoarseness, increased SOB, wheezing, some tightness x2 weeks - denies f/c/s.  has finished abx and pred thru PCP w/in last 2 weeks Took keflex and steroid taper with minimal  improvement. Has sinus congestion, ear fullness, hoarseness and drainage.  No hemotpysis or chest pain  No edema.  OTC not helping with cough.   06/29/12- 31 yo F former smoker, followed here for obstructive sleep apnea and for hx bronchitis, allergic rhinitis, complicated by DM, hypothyroid,  CAD  FOLLOWS FOR: still having cough and feels fullness in chest coming back since seeign TP.; discomfort on right side lung area CPAP 13 Feeling better after she was seen by the nurse practitioner October 4. Now has caught a new cold. Sneezing. Hard cough caused sharp pain right lateral rib. Area still hurts to cough. Scant phlegm, mild sore throat, no fever but did have some chills originally. Took Augmentin then amoxicillin used for dental prophylaxis. CXR 05/11/12-reviewed IMPRESSION:  No acute cardiopulmonary abnormality.  Original Report Authenticated By: [name], M.D.   11/06/12- 37 yo F former smoker, followed here for obstructive sleep apnea and for hx bronchitis, allergic rhinitis, complicated by DM, hypothyroid, CAD , OSA FOLLOW FOR:c/o sinus congestion,chest tightness x 4 days,mild sob w exertion,,cough and nasal congestion yellow,,right ear stopped up and hurting,Finished mouthwash that was called in 2 wks. Ago Eyes itch, postnasal drip, malaise x 4 days. Amoxacillin 2 weeks ago for ?strep throat. Diazepam for cramps.  CPAP 13/ [facility].   06/18/13- 73 yo F former smoker, followed here for obstructive sleep apnea and for hx bronchitis, allergic rhinitis, complicated by DM, hypothyroid, CAD , OSA FOLLOWS FOR: Breathing is slightly improved. Reports SOB and wheezing. Denies chest tightness today. Is producing green mucus from nose. Denies fever or sore throat. Using Mucinex and saline rinse. Had flu vaccine. CPAP 13/ [facility] is not being used as much as she should. Using Nexium, feels no reflux or heartburn  01/23/14- 28 yo F former smoker, followed here for obstructive sleep apnea and for hx bronchitis, allergic rhinitis, complicated by DM, hypothyroid, CAD , OSA FOLLOWS FOR:  Having increased sob with exertion, wheezing and cough with yellow mucus x1 month.  Pt has done round of anitbiotics and pred shot twice. CPAP/13/[facility] Onset of sinusitis in March with extension to  chest/bronchitis. Urgent care treated with steroid shot, antibiotic shot and then cephalosporin. She started itching attributed to the antibiotic. Chest x-ray was clear. She was given repeat steroid and antibiotic injections. Still blowing green from her nose. Using Sudafed and saline nasal rinse.no fever or headache recognized CXR 09/02/13 IMPRESSION:  No acute disease.  Electronically Signed  By: Inge Rise M.D.  On: 09/02/2013 20:28  08/04/14- 52 yo F former smoker, followed here for obstructive sleep apnea and for hx bronchitis, allergic rhinitis, complicated by DM, hypothyroid, CAD , OSA FOLLOWS FOR: DME [id number] had respiratory infection over Thanksgiving. Dr. Forde Dandy gave abx (Augmentin) last week and she feels better. She got steroid injection by Dr. Amedeo Plenty last week as well. Patient still on abx, she still has sl cough and wheeze at times. She denies sob at this time . Abnl nuclear stress test then cath- CAD/ stents, no progression CPAP 13/ [facility]- all night every night  12/08/14- 53 yo F former smoker, followed here for obstructive sleep apnea and for hx bronchitis, allergic rhinitis, complicated by DM, hypothyroid, CAD , OSA Follows for: prod cough at times w/yellow mucus; hoarseness; feels like back of throat is "gritty"; feels R side stopped up; x 1 mth CPAP 13/ [facility]- all night every night Gabapentin some help with chronic cough at 100 mg 3 times a day. Right ear itches. Cough worse at night,  lying down or if hot or humid.  07/10/2015-60 year old female former smoker followed for OSA, chronic bronchitis/chronic cough, allergic rhinitis, complicated by DM, hypothyroid, CAD CPAP 13/[facility] FOLLOWS FOR: Pt states cough-productive-yellow in color has come back; SOB and wheezing as well. Had chills Wednesday night but none since-did not check for fever. Waxing and waning malaise and cough, some recurrent chills and sneezing without fever or purulent sputum. Augmentin had seemed  to help but symptoms never completely cleared and Zithromax did little. Cough with scant phlegm. This all began as a sinus infection. Continues CPAP with good compliance and control described, 75  09/14/2015-60 year old female former smoker followed for OSA, chronic bronchitis/chronic cough, allergic rhinitis, complicated by DM, hypothyroid, CAD CPAP 13/[facility] Seen by nurse practitioner 07/21/2015 after ER visit for sinusitis and bronchitis treated with Avelox, prednisone, then required Diflucan for yeast. ACUTE VISIT: Per Dr Luan Moore continues to have nasal issues with it draining to her chest causing a cough-was given Hydrocodone cough syrup to help with cough at night. Pt takes abx and clears up for about 1 week then comes right back and worsens each time. CXR 07/12/2015-stable with chronic scarring right middle lobe, NAD. Recurrent head congestion, green nasal discharge, full feeling with some frontal and maxillary sinus pressure pain if she leans over. Scant yellow sputum from chest. No distinct fever or chills. Ears little stuffy, hearing okay. We spent some time discussing previous treatments for this issue. Extended visit  12/14/2015-60 year old female former smoker followed for OSA, chronic bronchitis/chronic cough, allergic rhinitis, currently by DM, hypothyroid, CAD CPAP 13/[facility] FENO 12/14/15- 22 ( nl 0-25_ PFT-pending 5/12 CXR 12/08/2015-NAD, borderline cardiomegaly, lungs clear ACUTE VISIT: Pt here for Feno test. Pt states she continues to have SOB with heart rate issues, feet swelling as well as her hands. Pt has PFT and ECHO scheduled for 12-22-15.   ROS-see HPI Constitutional:   No-   weight loss, night sweats, fevers, chills, fatigue, lassitude. HEENT:   No-  headaches, difficulty swallowing, tooth/dental problems, sore throat,      No-sneezing, itching, ear ache, +nasal congestion, + post nasal drip,  CV:  No- chest pain, no-orthopnea, PND, swelling in lower  extremities, anasarca,  dizziness, palpitations Resp: No-   shortness of breath with exertion or at rest.            +  productive cough,  + non-productive cough,  No- coughing up of blood.              No-change in color of mucus.  No- wheezing.   Skin: No-   rash or lesions. GI:  No-   heartburn, indigestion, abdominal pain, nausea, vomiting,  GU: . MS:  No-   joint pain or swelling.  . Neuro-     nothing unusual Psych:  No- change in mood or affect. No depression or anxiety.  No memory loss.  Objective:   Physical Exam OBJ- Physical Exam General- Alert, Oriented, Affect-appropriate, Distress- none acute Skin- rash-none, lesions- none, excoriation- none Lymphadenopathy- none Head- atraumatic            Eyes- + periorbital edema, Gross vision intact, PERRLA, conjunctivae and secretions clear            Ears- Hearing, canals-normal,             Nose- No- turb edema,  no-Septal dev, mucus bridging +, polyps, erosion, perforation             Throat- Mallampati II , mucosa clear ,  drainage- none, tonsils- atrophic Neck- flexible , trachea midline, no stridor , thyroid nl, carotid no bruit Chest - symmetrical excursion , unlabored           Heart/CV- RRR , no murmur , no gallop  , no rub, nl s1 s2                           - JVD- none , edema- none, stasis changes- none, varices- none           Lung-  +Coarse breath sounds, but No-wheeze,  cough +, dullness-none, rub- none           Chest wall-  Abd-  Br/ Gen/ Rectal- Not done, not indicated Extrem- cyanosis- none, clubbing, none, atrophy- none, strength- nl Neuro- grossly intact to observation

## 2015-12-16 ENCOUNTER — Encounter: Payer: Self-pay | Admitting: Occupational Therapy

## 2015-12-16 ENCOUNTER — Ambulatory Visit: Payer: 59 | Admitting: Occupational Therapy

## 2015-12-16 DIAGNOSIS — M542 Cervicalgia: Secondary | ICD-10-CM

## 2015-12-16 DIAGNOSIS — M25512 Pain in left shoulder: Secondary | ICD-10-CM

## 2015-12-16 DIAGNOSIS — M25511 Pain in right shoulder: Secondary | ICD-10-CM | POA: Diagnosis not present

## 2015-12-16 NOTE — Therapy (Signed)
[facility] [address]  [place], [name], [address] Phone: [phone]   Fax:  [phone]  Occupational Therapy Treatment  Patient Details  [name] MRN: [id number] Date of Birth: June 30, 1956 Referring Provider: Dr. Amedeo Plenty  Encounter Date: 12/16/2015      OT End of Session - 12/16/15 1201    Visit Number 3   Number of Visits 9   Date for OT Re-Evaluation 01/09/16   Authorization Type UHC/TRICARE   Authorization - Visit Number 3   Authorization - Number of Visits 10   OT Start Time 1100   OT Stop Time 1145   OT Time Calculation (min) 45 min   Activity Tolerance Patient tolerated treatment well      Past Medical History  Diagnosis Date  . Unspecified hypothyroidism   . Cough   . Acute bronchitis   . Obstructive sleep apnea (adult) (pediatric)   . Coronary atherosclerosis   . Anginal pain ([place])   . Hypertension   . Type II or unspecified type diabetes mellitus without mention of complication, not stated as uncontrolled     type 2  . Arthritis     osteo  . Anemia     hx of anemia    Past Surgical History  Procedure Laterality Date  . Tubal ligation    . Anterior fusion cervical spine    . Wisdom tooth extraction    . Left heart catheterization with coronary angiogram N/A 05/29/2014    Procedure: LEFT HEART CATHETERIZATION WITH CORONARY ANGIOGRAM;  Surgeon: Sinclair Grooms, MD;  Location: [facility] CATH LAB;  Service: Cardiovascular;  Laterality: N/A;    There were no vitals filed for this visit.      Subjective Assessment - 12/16/15 1156    Subjective  This feels much better (re: Rt shoulder after ultrasound, and Lt shoulder after STM's)   Pertinent History Anterior cervical fusion 2009, Rt rotator cuff tear (chronic)    Patient Stated Goals reduce pain   Currently in Pain? Yes   Pain Location Shoulder   Pain Orientation Right;Left   Pain Descriptors / Indicators Aching   Pain  Type Chronic pain   Pain Onset More than a month ago   Pain Frequency Intermittent   Aggravating Factors  driving bus, overuse   Pain Relieving Factors rest, heat                      OT Treatments/Exercises (OP) - 12/16/15 0001    Modalities   Modalities Ultrasound   Ultrasound   Ultrasound Location Rt upper traps (no contraindications or adverse reactions)   Ultrasound Parameters 1.2 wts/cm2, 1 Mhz, continuous x 8 minutes   Ultrasound Goals Pain   Manual Therapy   Manual Therapy Soft tissue mobilization;Taping   Soft tissue mobilization STM's to RT/LT upper traps, levator scapulae, and rhomboids followed by trigger point release   Kinesiotex Inhibit Muscle;Facilitate Muscle  relax upper traps, activate ER, scapula retraction, trunk ext on Rt                 OT Education - 12/16/15 1200    Education provided Yes   Education Details kinesiotape wear and care, info on how/where to purchase self cane massager   Person(s) Educated Patient   Methods Explanation;Handout   Comprehension Verbalized understanding             OT Long Term Goals - 12/09/15 1126  OT LONG TERM GOAL #1   Title Independent with updated HEP (All LTG's due 01/08/16)    Time 4   Period Weeks   Status New   OT LONG TERM GOAL #2   Title Pt to report pain less than or equal to 5/10 with work related tasks and lesiure activities   Baseline 8/10   Time 4   Period Weeks   Status New   OT LONG TERM GOAL #3   Title Pt to decrease disability from 38% to 25% or less on Quick Dash   Time 4   Period Weeks   Status New               Plan - 12/16/15 1201    Clinical Impression Statement Pt with good response to [place] and manual therapy   Rehab Potential Good   Clinical Impairments Affecting Rehab Potential chronic (time since onset)   OT Frequency 2x / week   OT Duration 4 weeks   OT Treatment/Interventions Therapeutic exercise;Patient/family education;Neuromuscular  education;Ultrasound;Manual Therapy;DME and/or AE instruction;Cryotherapy;Therapeutic activities;Electrical Stimulation;Moist Heat;Passive range of motion   Plan combo US/Estim for trigger release, strengthening HEP    Consulted and Agree with Plan of Care Patient      Patient will benefit from skilled therapeutic intervention in order to improve the following deficits and impairments:  Decreased range of motion, Improper spinal/pelvic alignment, Impaired UE functional use, Pain, Decreased strength  Visit Diagnosis: Pain in right shoulder  Pain in left shoulder  Cervicalgia    Problem List Patient Active Problem List   Diagnosis Date Noted  . Acute bronchitis 08/10/2014  . Unstable angina ([place]) 05/29/2014  . Abnormal nuclear stress test 05/29/2014  . Chest pain 05/06/2014  . Exertional shortness of breath 05/06/2014  . Mixed hyperlipidemia 05/06/2014  . Hyperlipidemia with target LDL less than 70 08/05/2013  . HTN (hypertension) 08/05/2013  . Chest wall pain 08/05/2013  . SINUSITIS, ACUTE 04/07/2009  . HYPOTHYROIDISM 12/10/2007  . DIABETES MELLITUS, TYPE II 05/28/2007  . Obstructive sleep apnea 05/28/2007  . Coronary atherosclerosis 05/28/2007  . Seasonal and perennial allergic rhinitis 05/28/2007  . COUGH, CHRONIC 05/28/2007    Carey Bullocks, OTR/L 12/16/2015, 12:03 PM  [facility] [address]  [place], [name], [address] Phone: [phone]   Fax:  [phone]  [name] MRN: [id number] Date of Birth: 1956/05/01

## 2015-12-21 ENCOUNTER — Encounter: Payer: Self-pay | Admitting: Occupational Therapy

## 2015-12-21 ENCOUNTER — Ambulatory Visit: Payer: 59 | Admitting: Occupational Therapy

## 2015-12-21 DIAGNOSIS — M542 Cervicalgia: Secondary | ICD-10-CM

## 2015-12-21 DIAGNOSIS — M25511 Pain in right shoulder: Secondary | ICD-10-CM

## 2015-12-21 DIAGNOSIS — M25512 Pain in left shoulder: Secondary | ICD-10-CM

## 2015-12-21 NOTE — Therapy (Signed)
[facility] [address]  [place], [name], [address] Phone: [phone]   Fax:  [phone]  Occupational Therapy Treatment  Patient Details  [name] MRN: [id number] Date of Birth: 15-Feb-1956 Referring Provider: Dr. Amedeo Plenty  Encounter Date: 12/21/2015      OT End of Session - 12/21/15 1313    Visit Number 4   Number of Visits 9   Date for OT Re-Evaluation 01/09/16   Authorization Type UHC/TRICARE   Authorization - Visit Number 4   Authorization - Number of Visits 10   OT Start Time 1010   OT Stop Time 1100   OT Time Calculation (min) 50 min   Activity Tolerance Patient tolerated treatment well      Past Medical History  Diagnosis Date  . Unspecified hypothyroidism   . Cough   . Acute bronchitis   . Obstructive sleep apnea (adult) (pediatric)   . Coronary atherosclerosis   . Anginal pain ([place])   . Hypertension   . Type II or unspecified type diabetes mellitus without mention of complication, not stated as uncontrolled     type 2  . Arthritis     osteo  . Anemia     hx of anemia    Past Surgical History  Procedure Laterality Date  . Tubal ligation    . Anterior fusion cervical spine    . Wisdom tooth extraction    . Left heart catheterization with coronary angiogram N/A 05/29/2014    Procedure: LEFT HEART CATHETERIZATION WITH CORONARY ANGIOGRAM;  Surgeon: Sinclair Grooms, MD;  Location[facility] CATH LAB;  Service: Cardiovascular;  Laterality: N/A;    There were no vitals filed for this visit.      Subjective Assessment - 12/21/15 1009    Subjective  The tape stayed on till Saturday. My Rt shoulder feels much better now, but my Lt shoulder is now hurting   Pertinent History Anterior cervical fusion 2009, Rt rotator cuff tear (chronic)    Currently in Pain? Yes   Pain Score 9    Pain Location Shoulder   Pain Orientation Left   Pain Descriptors / Indicators Aching   Pain Type  Chronic pain   Pain Onset More than a month ago   Pain Frequency Intermittent   Aggravating Factors  driving bus, overuse   Pain Relieving Factors rest, heat, [place]                      OT Treatments/Exercises (OP) - 12/21/15 0001    Exercises   Exercises Neck   Neck Exercises: Stretches   Upper Trapezius Stretch 5 reps;10 seconds  bilaterally   Other Neck Stretches cervical retraction and cervical rotation x 5 reps holding 10 sec.    Modalities   Modalities Ultrasound;Buyer, retail Rt upper traps   Chartered certified accountant trigger point release simulataneously with ultrasound   Electrical Stimulation Parameters 80/150 mHz x 10 minutes   Electrical Stimulation Goals Pain   Ultrasound   Ultrasound Location Lt upper traps, Rt upper traps   Ultrasound Parameters 1.2 wts/cm2, 1 Mhz continuous x 8 minutes on Lt; then on Rt x 10 minutes simultaneously with premod estim   Ultrasound Goals Pain   Manual Therapy   Manual Therapy Myofascial release   Manual therapy comments Lt upper traps   Kinesiotex Inhibit Muscle;Facilitate Muscle  to relax/inhibit Lt upper traps,  activate sh. ER Lt                     OT Long Term Goals - 12/09/15 1126    OT LONG TERM GOAL #1   Title Independent with updated HEP (All LTG's due 01/08/16)    Time 4   Period Weeks   Status New   OT LONG TERM GOAL #2   Title Pt to report pain less than or equal to 5/10 with work related tasks and lesiure activities   Baseline 8/10   Time 4   Period Weeks   Status New   OT LONG TERM GOAL #3   Title Pt to decrease disability from 38% to 25% or less on Quick Dash   Time 4   Period Weeks   Status New               Plan - 12/21/15 1314    Clinical Impression Statement Pt with positive response to [place], taping, and manual therapy to Rt shoulder from last session. Pt also with positive response to Lt shoulder pain  from 9/10 pain at beginning of session to no pain at end of session.    Rehab Potential Good   Clinical Impairments Affecting Rehab Potential chronic (time since onset)   OT Frequency 2x / week   OT Duration 4 weeks   OT Treatment/Interventions Therapeutic exercise;Patient/family education;Neuromuscular education;Ultrasound;Manual Therapy;DME and/or AE instruction;Cryotherapy;Therapeutic activities;Electrical Stimulation;Moist Heat;Passive range of motion   Plan continue modalities, manual therapy prn, stregnthening HEP    Consulted and Agree with Plan of Care Patient      Patient will benefit from skilled therapeutic intervention in order to improve the following deficits and impairments:  Decreased range of motion, Improper spinal/pelvic alignment, Impaired UE functional use, Pain, Decreased strength  Visit Diagnosis: Pain in right shoulder  Pain in left shoulder  Cervicalgia    Problem List Patient Active Problem List   Diagnosis Date Noted  . Acute bronchitis 08/10/2014  . Unstable angina ([place]) 05/29/2014  . Abnormal nuclear stress test 05/29/2014  . Chest pain 05/06/2014  . Exertional shortness of breath 05/06/2014  . Mixed hyperlipidemia 05/06/2014  . Hyperlipidemia with target LDL less than 70 08/05/2013  . HTN (hypertension) 08/05/2013  . Chest wall pain 08/05/2013  . SINUSITIS, ACUTE 04/07/2009  . HYPOTHYROIDISM 12/10/2007  . DIABETES MELLITUS, TYPE II 05/28/2007  . Obstructive sleep apnea 05/28/2007  . Coronary atherosclerosis 05/28/2007  . Seasonal and perennial allergic rhinitis 05/28/2007  . COUGH, CHRONIC 05/28/2007    Carey Bullocks, OTR/L 12/21/2015, 1:17 PM  [facility] [address]  [place], [name], [address] Phone: [phone]   Fax:  [phone]  [name] MRN: [id number] Date of Birth: 28-Oct-1955

## 2015-12-22 ENCOUNTER — Ambulatory Visit (HOSPITAL_COMMUNITY)
Admission: RE | Admit: 2015-12-22 | Discharge: 2015-12-22 | Disposition: A | Discharge status: Home / Self Care | Payer: 59 | Source: Ambulatory Visit | Attending: Internal Medicine | Admitting: Internal Medicine

## 2015-12-22 ENCOUNTER — Ambulatory Visit (HOSPITAL_COMMUNITY): Payer: 59

## 2015-12-22 DIAGNOSIS — R079 Chest pain, unspecified: Secondary | ICD-10-CM

## 2015-12-22 DIAGNOSIS — R0600 Dyspnea, unspecified: Secondary | ICD-10-CM | POA: Insufficient documentation

## 2015-12-22 LAB — PULMONARY FUNCTION TEST
DL/VA % pred: 66 %
DL/VA: 3.03 ml/min/mmHg/L
DLCO unc % pred: 60 %
DLCO unc: 13.06 ml/min/mmHg
FEF 25-75 Post: 1.11 L/s
FEF 25-75 Pre: 1.3 L/s
FEF2575-%Change-Post: -14 %
FEF2575-%Pred-Post: 57 %
FEF2575-%Pred-Pre: 67 %
FEV1-%Change-Post: -10 %
FEV1-%Pred-Post: 87 %
FEV1-%Pred-Pre: 97 %
FEV1-Post: 1.67 L
FEV1-Pre: 1.87 L
FEV1FVC-%Change-Post: -6 %
FEV1FVC-%Pred-Pre: 90 %
FEV6-%Change-Post: -4 %
FEV6-%Pred-Post: 106 %
FEV6-%Pred-Pre: 111 %
FEV6-Post: 2.48 L
FEV6-Pre: 2.6 L
FEV6FVC-%Pred-Post: 104 %
FEV6FVC-%Pred-Pre: 104 %
FVC-%Change-Post: -4 %
FVC-%Pred-Post: 102 %
FVC-%Pred-Pre: 106 %
FVC-Post: 2.48 L
FVC-Pre: 2.6 L
Post FEV1/FVC ratio: 67 %
Post FEV6/FVC ratio: 100 %
Pre FEV1/FVC ratio: 72 %
Pre FEV6/FVC Ratio: 100 %
RV % pred: 118 %
RV: 2.25 L
TLC % pred: 100 %
TLC: 4.76 L

## 2015-12-22 MED ORDER — ALBUTEROL SULFATE (2.5 MG/3ML) 0.083% IN NEBU
2.5000 mg | INHALATION_SOLUTION | Freq: Once | RESPIRATORY_TRACT | Status: AC
  Administered 2015-12-22: 2.5 mg via RESPIRATORY_TRACT

## 2015-12-23 ENCOUNTER — Ambulatory Visit: Payer: 59 | Admitting: Occupational Therapy

## 2015-12-23 DIAGNOSIS — M25512 Pain in left shoulder: Secondary | ICD-10-CM

## 2015-12-23 DIAGNOSIS — M25511 Pain in right shoulder: Secondary | ICD-10-CM | POA: Diagnosis not present

## 2015-12-23 DIAGNOSIS — R293 Abnormal posture: Secondary | ICD-10-CM

## 2015-12-23 DIAGNOSIS — M6281 Muscle weakness (generalized): Secondary | ICD-10-CM

## 2015-12-23 NOTE — Therapy (Signed)
[facility] [address]  [place], [name], [address] Phone: [phone]   Fax:  [phone]  Occupational Therapy Treatment  Patient Details  [name] MRN: [id number] Date of Birth: July 02, 1956 Referring Provider: Dr. Amedeo Plenty  Encounter Date: 12/23/2015      OT End of Session - 12/23/15 1212    Visit Number 5   Number of Visits 9   Date for OT Re-Evaluation 01/09/16   Authorization Type UHC/TRICARE   Authorization - Visit Number 5   Authorization - Number of Visits 10   OT Start Time [id number]   OT Stop Time 1100   OT Time Calculation (min) 45 min   Activity Tolerance Patient tolerated treatment well      Past Medical History  Diagnosis Date  . Unspecified hypothyroidism   . Cough   . Acute bronchitis   . Obstructive sleep apnea (adult) (pediatric)   . Coronary atherosclerosis   . Anginal pain ([place])   . Hypertension   . Type II or unspecified type diabetes mellitus without mention of complication, not stated as uncontrolled     type 2  . Arthritis     osteo  . Anemia     hx of anemia    Past Surgical History  Procedure Laterality Date  . Tubal ligation    . Anterior fusion cervical spine    . Wisdom tooth extraction    . Left heart catheterization with coronary angiogram N/A 05/29/2014    Procedure: LEFT HEART CATHETERIZATION WITH CORONARY ANGIOGRAM;  Surgeon: Sinclair Grooms, MD;  Location: [facility] CATH LAB;  Service: Cardiovascular;  Laterality: N/A;    There were no vitals filed for this visit.      Subjective Assessment - 12/23/15 1026    Subjective  My Rt side is fine, but my Lt is still hurting   Pertinent History Anterior cervical fusion 2009, Rt rotator cuff tear (chronic)    Patient Stated Goals reduce pain   Currently in Pain? Yes   Pain Score 8    Pain Location Shoulder   Pain Orientation Left   Pain Descriptors / Indicators Dull   Pain Type Chronic pain   Pain Onset  More than a month ago   Pain Frequency Constant   Aggravating Factors  driving bus, sleep   Pain Relieving Factors heat, [place]                      OT Treatments/Exercises (OP) - 12/23/15 0001    Exercises   Exercises Shoulder   Shoulder Exercises: ROM/Strengthening   Other ROM/Strengthening Exercises Prone: scapula retraction x 10 reps   Other ROM/Strengthening Exercises Bilateral shoulder rows, shoulder ext x 10 reps each with yellow theraband   Electrical Stimulation   Electrical Stimulation Location Lt upper traps   Electrical Stimulation Action premod setting for pain relief and trigger point release simultaneously with ultrasound   Electrical Stimulation Parameters 80/150 mHz x 10 min   Electrical Stimulation Goals Pain   Ultrasound   Ultrasound Location Lt upper traps and levator scapulae   Ultrasound Parameters 1.2 wts/cm2, 1 Mhz, continuous x 10 min   Ultrasound Goals Pain   Manual Therapy   Kinesiotex Inhibit Muscle  Lt upper traps and levator scapulae                OT Education - 12/23/15 1211    Education provided Yes   Education Details  Theraband HEP (rows, shoulder ext)   Person(s) Educated Patient   Methods Explanation;Demonstration   Comprehension Verbalized understanding;Returned demonstration             OT Long Term Goals - 12/09/15 1126    OT LONG TERM GOAL #1   Title Independent with updated HEP (All LTG's due 01/08/16)    Time 4   Period Weeks   Status New   OT LONG TERM GOAL #2   Title Pt to report pain less than or equal to 5/10 with work related tasks and lesiure activities   Baseline 8/10   Time 4   Period Weeks   Status New   OT LONG TERM GOAL #3   Title Pt to decrease disability from 38% to 25% or less on Quick Dash   Time 4   Period Weeks   Status New               Plan - 12/23/15 1212    Clinical Impression Statement Pt remains pain free RT shoulder, however pt reports Lt shoulder/scapula began  bothering her again yesterday evening.    Rehab Potential Good   Clinical Impairments Affecting Rehab Potential chronic (time since onset)   OT Frequency 2x / week   OT Duration 4 weeks   OT Treatment/Interventions Therapeutic exercise;Patient/family education;Neuromuscular education;Ultrasound;Manual Therapy;DME and/or AE instruction;Cryotherapy;Therapeutic activities;Electrical Stimulation;Moist Heat;Passive range of motion   Plan continue modalities, manual therapy, strengthening   Consulted and Agree with Plan of Care Patient      Patient will benefit from skilled therapeutic intervention in order to improve the following deficits and impairments:  Decreased range of motion, Improper spinal/pelvic alignment, Impaired UE functional use, Pain, Decreased strength  Visit Diagnosis: Pain in left shoulder  Abnormal posture  Muscle weakness (generalized)    Problem List Patient Active Problem List   Diagnosis Date Noted  . Acute bronchitis 08/10/2014  . Unstable angina ([place]) 05/29/2014  . Abnormal nuclear stress test 05/29/2014  . Chest pain 05/06/2014  . Exertional shortness of breath 05/06/2014  . Mixed hyperlipidemia 05/06/2014  . Hyperlipidemia with target LDL less than 70 08/05/2013  . HTN (hypertension) 08/05/2013  . Chest wall pain 08/05/2013  . SINUSITIS, ACUTE 04/07/2009  . HYPOTHYROIDISM 12/10/2007  . DIABETES MELLITUS, TYPE II 05/28/2007  . Obstructive sleep apnea 05/28/2007  . Coronary atherosclerosis 05/28/2007  . Seasonal and perennial allergic rhinitis 05/28/2007  . COUGH, CHRONIC 05/28/2007    [name], OTR/L 12/23/2015, 12:15 PM  [facility] [address]  [place], [name], [address] Phone: [phone]   Fax:  [phone]  [name] MRN: [id number] Date of Birth: 1956/03/17

## 2015-12-28 ENCOUNTER — Encounter: Payer: Self-pay | Admitting: Occupational Therapy

## 2015-12-28 ENCOUNTER — Ambulatory Visit: Payer: 59 | Admitting: Occupational Therapy

## 2015-12-28 DIAGNOSIS — M25512 Pain in left shoulder: Secondary | ICD-10-CM

## 2015-12-28 DIAGNOSIS — R293 Abnormal posture: Secondary | ICD-10-CM

## 2015-12-28 DIAGNOSIS — M6281 Muscle weakness (generalized): Secondary | ICD-10-CM

## 2015-12-28 DIAGNOSIS — M25511 Pain in right shoulder: Secondary | ICD-10-CM | POA: Diagnosis not present

## 2015-12-28 NOTE — Therapy (Signed)
[facility] [address]  [place], [name], [address] Phone: [phone]   Fax:  [phone]  Occupational Therapy Treatment  Patient Details  [name] MRN: [id number] Date of Birth: Apr 05, 1956 Referring Provider: Dr. Amedeo Plenty  Encounter Date: 12/28/2015      OT End of Session - 12/28/15 1125    Visit Number 6   Number of Visits 9   Date for OT Re-Evaluation 01/09/16   Authorization Type UHC/TRICARE   Authorization - Visit Number 6   Authorization - Number of Visits 10   OT Start Time [id number]   OT Stop Time 1100   OT Time Calculation (min) 45 min   Activity Tolerance Patient tolerated treatment well      Past Medical History  Diagnosis Date  . Unspecified hypothyroidism   . Cough   . Acute bronchitis   . Obstructive sleep apnea (adult) (pediatric)   . Coronary atherosclerosis   . Anginal pain ([place])   . Hypertension   . Type II or unspecified type diabetes mellitus without mention of complication, not stated as uncontrolled     type 2  . Arthritis     osteo  . Anemia     hx of anemia    Past Surgical History  Procedure Laterality Date  . Tubal ligation    . Anterior fusion cervical spine    . Wisdom tooth extraction    . Left heart catheterization with coronary angiogram N/A 05/29/2014    Procedure: LEFT HEART CATHETERIZATION WITH CORONARY ANGIOGRAM;  Surgeon: Sinclair Grooms, MD;  Location: [facility] CATH LAB;  Service: Cardiovascular;  Laterality: N/A;    There were no vitals filed for this visit.      Subjective Assessment - 12/28/15 1023    Pertinent History Anterior cervical fusion 2009, Rt rotator cuff tear (chronic)    Patient Stated Goals reduce pain   Currently in Pain? Yes   Pain Score 5    Pain Location Shoulder   Pain Orientation Left   Pain Descriptors / Indicators Aching   Pain Type Chronic pain   Pain Onset More than a month ago   Pain Frequency Constant   Aggravating Factors  Driving bus, sleep   Pain Relieving Factors estim, heat, [place]                      OT Treatments/Exercises (OP) - 12/28/15 0001    Shoulder Exercises: ROM/Strengthening   Other ROM/Strengthening Exercises Quadraped: assessed Lt scapula with A-P rocks and disengaging RUE. Pt noted to have mild winging and elevation of Lt scapula   Other ROM/Strengthening Exercises Bilateral shoulder rows, shoulder ext x 15 reps each with yellow theraband. Attempted bilateral horizontal abd. but noted increased pain and Lt scapula elevation with this. Pt then performed chair push ups for scapula depression x 5 reps. Pt noted to be very weak on LUE and compensating with Rt side.    Data processing manager Pre-mod, with hot pack to Lt shoulder simultaneously   Electrical Stimulation Parameters 80/150 beats, continuous x 15 min   Electrical Stimulation Goals Pain   Manual Therapy   Soft tissue mobilization STM's to LT upper traps, levator scapulae, and rhomboids followed by trigger point release                OT Education - 12/28/15 1124  Education provided Yes   Education Details Review of theraband HEP, added chair push ups   Person(s) Educated Patient   Methods Explanation;Demonstration   Comprehension Verbalized understanding;Returned demonstration             OT Long Term Goals - 12/09/15 1126    OT LONG TERM GOAL #1   Title Independent with updated HEP (All LTG's due 01/08/16)    Time 4   Period Weeks   Status New   OT LONG TERM GOAL #2   Title Pt to report pain less than or equal to 5/10 with work related tasks and lesiure activities   Baseline 8/10   Time 4   Period Weeks   Status New   OT LONG TERM GOAL #3   Title Pt to decrease disability from 38% to 25% or less on Quick Dash   Time 4   Period Weeks   Status New               Plan - 12/28/15  1125    Clinical Impression Statement Pt with overall less pain in Lt shoulder girdle (from 8/10 to 5/10). Pt remains pain free Rt shoulder. Pt responding well to modalities and manual therapy   Rehab Potential Good   Clinical Impairments Affecting Rehab Potential chronic (time since onset)   OT Frequency 2x / week   OT Treatment/Interventions Therapeutic exercise;Patient/family education;Neuromuscular education;Ultrasound;Manual Therapy;DME and/or AE instruction;Cryotherapy;Therapeutic activities;Electrical Stimulation;Moist Heat;Passive range of motion   Plan continue modalities, STM's, scapula stabilization ex's supine with weight and quadraped   Consulted and Agree with Plan of Care Patient      Patient will benefit from skilled therapeutic intervention in order to improve the following deficits and impairments:  Decreased range of motion, Improper spinal/pelvic alignment, Impaired UE functional use, Pain, Decreased strength  Visit Diagnosis: Pain in left shoulder  Abnormal posture  Muscle weakness (generalized)    Problem List Patient Active Problem List   Diagnosis Date Noted  . Acute bronchitis 08/10/2014  . Unstable angina ([place]) 05/29/2014  . Abnormal nuclear stress test 05/29/2014  . Chest pain 05/06/2014  . Exertional shortness of breath 05/06/2014  . Mixed hyperlipidemia 05/06/2014  . Hyperlipidemia with target LDL less than 70 08/05/2013  . HTN (hypertension) 08/05/2013  . Chest wall pain 08/05/2013  . SINUSITIS, ACUTE 04/07/2009  . HYPOTHYROIDISM 12/10/2007  . DIABETES MELLITUS, TYPE II 05/28/2007  . Obstructive sleep apnea 05/28/2007  . Coronary atherosclerosis 05/28/2007  . Seasonal and perennial allergic rhinitis 05/28/2007  . COUGH, CHRONIC 05/28/2007    Carey Bullocks, OTR/L 12/28/2015, 11:27 AM  [facility] [address]  [place], [name], [address] Phone: [phone]   Fax:   [phone]  Name: SHELVY FERRITER MRN: [id number] Date of Birth: 10-29-1955

## 2015-12-30 ENCOUNTER — Ambulatory Visit: Payer: 59 | Admitting: Occupational Therapy

## 2016-01-06 ENCOUNTER — Ambulatory Visit (HOSPITAL_COMMUNITY): Payer: 59 | Attending: Cardiovascular Disease

## 2016-01-06 ENCOUNTER — Other Ambulatory Visit: Payer: Self-pay

## 2016-01-06 ENCOUNTER — Ambulatory Visit: Payer: 59 | Admitting: Occupational Therapy

## 2016-01-06 DIAGNOSIS — E119 Type 2 diabetes mellitus without complications: Secondary | ICD-10-CM | POA: Diagnosis not present

## 2016-01-06 DIAGNOSIS — R0600 Dyspnea, unspecified: Secondary | ICD-10-CM | POA: Insufficient documentation

## 2016-01-06 DIAGNOSIS — R079 Chest pain, unspecified: Secondary | ICD-10-CM

## 2016-01-06 DIAGNOSIS — G4733 Obstructive sleep apnea (adult) (pediatric): Secondary | ICD-10-CM | POA: Insufficient documentation

## 2016-01-06 DIAGNOSIS — E785 Hyperlipidemia, unspecified: Secondary | ICD-10-CM | POA: Insufficient documentation

## 2016-01-06 DIAGNOSIS — I351 Nonrheumatic aortic (valve) insufficiency: Secondary | ICD-10-CM | POA: Insufficient documentation

## 2016-01-06 DIAGNOSIS — Z87891 Personal history of nicotine dependence: Secondary | ICD-10-CM | POA: Insufficient documentation

## 2016-01-06 DIAGNOSIS — I10 Essential (primary) hypertension: Secondary | ICD-10-CM | POA: Insufficient documentation

## 2016-01-11 ENCOUNTER — Ambulatory Visit: Payer: 59 | Admitting: Occupational Therapy

## 2016-01-13 ENCOUNTER — Encounter: Payer: Self-pay | Admitting: Occupational Therapy

## 2016-01-13 ENCOUNTER — Ambulatory Visit: Payer: 59 | Attending: Orthopedic Surgery | Admitting: Occupational Therapy

## 2016-01-13 DIAGNOSIS — M6281 Muscle weakness (generalized): Secondary | ICD-10-CM | POA: Insufficient documentation

## 2016-01-13 DIAGNOSIS — R293 Abnormal posture: Secondary | ICD-10-CM | POA: Diagnosis present

## 2016-01-13 DIAGNOSIS — M25512 Pain in left shoulder: Secondary | ICD-10-CM

## 2016-01-13 NOTE — Therapy (Signed)
[facility] [address]  [place], [name], [address] Phone: [phone]   Fax:  [phone]  Occupational Therapy Treatment  Patient Details  [name] MRN: [id number] Date of Birth: [birth year] Referring Provider: Dr. Amedeo Plenty  Encounter Date: 01/13/2016      OT End of Session - 01/13/16 1121    Visit Number 7   Number of Visits 9   Date for OT Re-Evaluation 01/09/16   Authorization Type UHC/TRICARE   Authorization - Visit Number 7   Authorization - Number of Visits 10   OT Start Time 1025   OT Stop Time 1110   OT Time Calculation (min) 45 min   Activity Tolerance Patient tolerated treatment well      Past Medical History  Diagnosis Date  . Unspecified hypothyroidism   . Cough   . Acute bronchitis   . Obstructive sleep apnea (adult) (pediatric)   . Coronary atherosclerosis   . Anginal pain ([place])   . Hypertension   . Type II or unspecified type diabetes mellitus without mention of complication, not stated as uncontrolled     type 2  . Arthritis     osteo  . Anemia     hx of anemia    Past Surgical History  Procedure Laterality Date  . Tubal ligation    . Anterior fusion cervical spine    . Wisdom tooth extraction    . Left heart catheterization with coronary angiogram N/A 05/29/2014    Procedure: LEFT HEART CATHETERIZATION WITH CORONARY ANGIOGRAM;  Surgeon: Sinclair Grooms, MD;  Location: [facility] CATH LAB;  Service: Cardiovascular;  Laterality: N/A;    There were no vitals filed for this visit.      Subjective Assessment - 01/13/16 1034    Subjective  My Rt shoulder is still doing pretty good. My Lt shoulder was doing pretty good until yesterday   Pertinent History Anterior cervical fusion 2009, Rt rotator cuff tear (chronic)    Patient Stated Goals reduce pain   Currently in Pain? Yes   Pain Score 6    Pain Location Shoulder   Pain Orientation Left   Pain Descriptors /  Indicators Aching   Pain Type Chronic pain   Pain Onset More than a month ago   Pain Frequency Constant   Aggravating Factors  driving bus, sleep   Pain Relieving Factors estim, heat, ultrasound, exercises                      OT Treatments/Exercises (OP) - 01/13/16 0001    Neurological Re-education Exercises   Other Exercises 1 Wall push ups x 10 reps for scapula strengthening and stabalization. Pt with noted weakenss Lt scapula   Other Exercises 2 Supine: scapula stregnthening LUE while performing small circumduction shoulder movements at 90* with 3 lb. weight (clockwise and counterclockwise x 10 reps each way)    Other Weight-Bearing Exercises 1 Quadraped: A/P wt shifts; pt noted to demo scapula winging Lt side with further anterior wt. shifts, therefore pt instructed to stop at certain point before winging occured.  Progressed to disengaging RUE to increase wt. over LUE and for scapula stabalization.    Data processing manager Pre-mod, with hot pack to Lt shoulder    Electrical Stimulation Parameters 80/150 Hz, continuous x 15 minutes   Electrical Stimulation Goals Pain   Manual Therapy  Soft tissue mobilization STM's to LT upper traps, levator scapulae, and rhomboids followed by trigger point release                     OT Long Term Goals - 12/09/15 1126    OT LONG TERM GOAL #1   Title Independent with updated HEP (All LTG's due 01/08/16)    Time 4   Period Weeks   Status New   OT LONG TERM GOAL #2   Title Pt to report pain less than or equal to 5/10 with work related tasks and lesiure activities   Baseline 8/10   Time 4   Period Weeks   Status New   OT LONG TERM GOAL #3   Title Pt to decrease disability from 38% to 25% or less on Quick Dash   Time 4   Period Weeks   Status New               Plan - 01/13/16 1122    Clinical Impression Statement Pt  continues to make slow progress with Lt shoulder pain. Pt reports overall decreased pain since last session, but pain came back yesterday.    Rehab Potential Good   Clinical Impairments Affecting Rehab Potential chronic (time since onset)   OT Frequency 2x / week   OT Duration 4 weeks   OT Treatment/Interventions Therapeutic exercise;Patient/family education;Neuromuscular education;Ultrasound;Manual Therapy;DME and/or AE instruction;Cryotherapy;Therapeutic activities;Electrical Stimulation;Moist Heat;Passive range of motion   Plan assess LTG's and Quick Dash, renew next visit, issue updated HEP    Consulted and Agree with Plan of Care Patient      Patient will benefit from skilled therapeutic intervention in order to improve the following deficits and impairments:  Decreased range of motion, Improper spinal/pelvic alignment, Impaired UE functional use, Pain, Decreased strength  Visit Diagnosis: Pain in left shoulder  Abnormal posture  Muscle weakness (generalized)    Problem List Patient Active Problem List   Diagnosis Date Noted  . Acute bronchitis 08/10/2014  . Unstable angina ([place]) 05/29/2014  . Abnormal nuclear stress test 05/29/2014  . Chest pain 05/06/2014  . Exertional shortness of breath 05/06/2014  . Mixed hyperlipidemia 05/06/2014  . Hyperlipidemia with target LDL less than 70 08/05/2013  . HTN (hypertension) 08/05/2013  . Chest wall pain 08/05/2013  . SINUSITIS, ACUTE 04/07/2009  . HYPOTHYROIDISM 12/10/2007  . DIABETES MELLITUS, TYPE II 05/28/2007  . Obstructive sleep apnea 05/28/2007  . Coronary atherosclerosis 05/28/2007  . Seasonal and perennial allergic rhinitis 05/28/2007  . COUGH, CHRONIC 05/28/2007    Carey Bullocks, OTR/L 01/13/2016, 11:26 AM  [facility] [address]  [place], [name], [address] Phone: [phone]   Fax:  [phone]  [name] MRN:  [id number] Date of Birth: Aug 31, 1955

## 2016-01-18 ENCOUNTER — Encounter: Payer: Self-pay | Admitting: Occupational Therapy

## 2016-01-18 ENCOUNTER — Ambulatory Visit: Payer: 59 | Admitting: Occupational Therapy

## 2016-01-18 DIAGNOSIS — M25512 Pain in left shoulder: Secondary | ICD-10-CM | POA: Diagnosis not present

## 2016-01-18 DIAGNOSIS — M6281 Muscle weakness (generalized): Secondary | ICD-10-CM

## 2016-01-18 DIAGNOSIS — R293 Abnormal posture: Secondary | ICD-10-CM

## 2016-01-18 NOTE — Patient Instructions (Signed)
1. Scapular Retraction (Prone)    Lie with arms at sides. Pinch shoulder blades together and raise arms a few inches from floor. Repeat _10___ times per set.  Do __3__ sessions per day.     2. All Fours Shoulder Flexion / Extension    Place hands and knees shoulder-width apart, rock back and sit on legs, then rock slowly forward over hands and forearms. Repeat _10___ times or for __2__ minutes. Do _2___ sessions per day.  Then progress to lifting Rt arm up to reach in front while placing weight over Lt arm.  **Concentrate on stabalizing Lt shoulder blade during above activities    3. Wall Push-Up    With feet and hands shoulder-width apart, lean into wall, then push away from wall. Repeat _10-15___ times. Do _2-3___ sessions per day. **Concentrate on keeping shoulder blades controlled    4. Scapular Retraction: Bilateral    Facing anchor, pull arms back, bringing shoulder blades together. Repeat _10___ times per set.  Do __2__ sessions per day.    5. Strengthening: Resisted Extension    Hold tubing in both arms, arms forward. Pull arms back, elbow straight. Repeat _10___ times per set.  Do __2__ sessions per day.   6. Lay on back with 2-3 lb weight held with Lt arm straight at eye level (90 degrees) and perform small controlled circles clockwise and counterclockwise), x 10 reps each way. Do 2 times per day

## 2016-01-18 NOTE — Therapy (Signed)
[facility] [address]  [place], [name], [address] Phone: [phone]   Fax:  [phone]  Occupational Therapy Treatment  Patient Details  [name] MRN: [id number] Date of Birth: [birth year] Referring Provider: Dr. Amedeo Plenty  Encounter Date: 01/18/2016      OT End of Session - 01/18/16 1122    Visit Number 8   Number of Visits 16   Date for OT Re-Evaluation 02/17/16   Authorization Type UHC/TRICARE - G code needed   Authorization Time Period week 1/4   Authorization - Visit Number 8   Authorization - Number of Visits 10   OT Start Time 9169   OT Stop Time 1100   OT Time Calculation (min) 45 min   Activity Tolerance Patient tolerated treatment well      Past Medical History  Diagnosis Date  . Unspecified hypothyroidism   . Cough   . Acute bronchitis   . Obstructive sleep apnea (adult) (pediatric)   . Coronary atherosclerosis   . Anginal pain ([place])   . Hypertension   . Type II or unspecified type diabetes mellitus without mention of complication, not stated as uncontrolled     type 2  . Arthritis     osteo  . Anemia     hx of anemia    Past Surgical History  Procedure Laterality Date  . Tubal ligation    . Anterior fusion cervical spine    . Wisdom tooth extraction    . Left heart catheterization with coronary angiogram N/A 05/29/2014    Procedure: LEFT HEART CATHETERIZATION WITH CORONARY ANGIOGRAM;  Surgeon: Sinclair Grooms, MD;  Location: [facility] CATH LAB;  Service: Cardiovascular;  Laterality: N/A;    There were no vitals filed for this visit.      Subjective Assessment - 01/18/16 1024    Subjective  The exercises are helping. My pain isn't too bad today - I don't feel like I need the heat, just the estim.    Pertinent History Anterior cervical fusion 2009, Rt rotator cuff tear (chronic)    Patient Stated Goals reduce pain   Currently in Pain? Yes   Pain Score 3    Pain  Location Shoulder   Pain Orientation Left   Pain Descriptors / Indicators Aching   Pain Type Chronic pain   Pain Onset More than a month ago   Pain Frequency Constant   Aggravating Factors  driving bus, sleeping on Lt side too long   Pain Relieving Factors estim, heat, ultrasound, exercises                      OT Treatments/Exercises (OP) - 01/18/16 0001    ADLs   ADL Comments Assessed Quick Dash (32%), LTG's, and progress to date while pt simultaneously on Pre mod estim for renewal.    Neurological Re-education Exercises   Scapular Stabilization Prone;Supine;Quadraped;Standing   Other Exercises 1 Prone - bilateral scapula retraction x 10 reps. Supine: LUE holding 3 lb. weight at 90* shoulder flexion performing small circumduction ex's clockwise and counterclockwise for scapula stabalization   Other Exercises 2 Standing: wall push ups x 10 reps with min v.c's and tactile cues to perform correctly (to prevent shoulder hiking and winging Lt side). Followed by bilateral shoulder rows, and bilateral shoulder extension with yellow theraband x 10 reps each for neuro re-educ. to prevent compensations and use correct muscles to stabalize/strengthen Lt scapula   Other  Weight-Bearing Exercises 1 Quadraped: A/P wt shifts; pt noted to demo scapula winging Lt side with further anterior wt. shifts, therefore pt instructed to stop at certain point before winging occured.  Progressed to disengaging RUE to increase wt. over LUE and for scapula stabalization.    Research officer, political party Parameters 80/150 Hz, continuous x 15 minutes   Electrical Stimulation Goals Pain                OT Education - 01/18/16 1112    Education provided Yes   Education Details Updated/consolidated HEP    Person(s) Educated Patient   Methods Explanation;Demonstration;Handout    Comprehension Verbalized understanding;Returned demonstration;Verbal cues required             OT Long Term Goals - 01/18/16 1123    OT LONG TERM GOAL #1   Title Independent with updated HEP (All LTG's due7/12/17)    Time 4   Period Weeks   Status On-going  issued, needs re-inforcement for renewal beginning 01/18/16   OT LONG TERM GOAL #2   Title Pt to report pain less than or equal to 5/10 with work related tasks and lesiure activities   Baseline 8/10   Time 4   Period Weeks   Status Achieved   OT LONG TERM GOAL #3   Title Pt to decrease disability from 38% to 25% or less on Quick Dash   Baseline eval = 38%, renewal 01/18/16 = 32%   Time 4   Period Weeks   Status On-going  ongoing for renewal period beginning 01/18/16   OT LONG TERM GOAL #4   Title Pain Lt shoulder to be 3/10 or under for daily activities    Baseline up to 5/10    Time 4   Period Weeks   Status New  new goal for renewal period beginning 01/18/16   OT LONG TERM GOAL #5   Title Pt to perform bilateral overhead reaching to retrieve/replace 5 lb. or > objects from high shelf consistently   Time 4   Period Weeks   Status New  new goal for renewal period beginning 01/18/16               Plan - 01/18/16 1128    Clinical Impression Statement Pt met LTG #2. Pt approximating LTG's #1 and #3. Added new goals (LTG #4 and #5 for renewal period beginning today). Renewal completed today   Rehab Potential Good   OT Frequency 2x / week   OT Duration 4 weeks  additional 4 weeks beginning today   OT Treatment/Interventions Therapeutic exercise;Patient/family education;Neuromuscular education;Ultrasound;Manual Therapy;DME and/or AE instruction;Cryotherapy;Therapeutic activities;Electrical Stimulation;Moist Heat;Passive range of motion   Plan Renewal completed today for 2x/wk for additional 4 weeks. Continue modalities, scapula strengthening (attempt bilateral horizontal abd again and full shoulder flex with scap  retraction)      Patient will benefit from skilled therapeutic intervention in order to improve the following deficits and impairments:  Decreased range of motion, Improper spinal/pelvic alignment, Impaired UE functional use, Pain, Decreased strength  Visit Diagnosis: Pain in left shoulder - Plan: Ot plan of care cert/re-cert  Abnormal posture - Plan: Ot plan of care cert/re-cert  Muscle weakness (generalized) - Plan: Ot plan of care cert/re-cert    Problem List Patient Active Problem List   Diagnosis Date Noted  . Acute bronchitis 08/10/2014  . Unstable angina ([place]) 05/29/2014  . Abnormal  nuclear stress test 05/29/2014  . Chest pain 05/06/2014  . Exertional shortness of breath 05/06/2014  . Mixed hyperlipidemia 05/06/2014  . Hyperlipidemia with target LDL less than 70 08/05/2013  . HTN (hypertension) 08/05/2013  . Chest wall pain 08/05/2013  . SINUSITIS, ACUTE 04/07/2009  . HYPOTHYROIDISM 12/10/2007  . DIABETES MELLITUS, TYPE II 05/28/2007  . Obstructive sleep apnea 05/28/2007  . Coronary atherosclerosis 05/28/2007  . Seasonal and perennial allergic rhinitis 05/28/2007  . COUGH, CHRONIC 05/28/2007    Carey Bullocks, OTR/L 01/18/2016, 11:38 AM  [facility] [address]  [place], [name], [address] Phone: [phone]   Fax:  [phone]  [name] MRN: [id number] Date of Birth: Aug 02, 1956

## 2016-01-20 ENCOUNTER — Ambulatory Visit: Payer: 59 | Admitting: Occupational Therapy

## 2016-01-20 DIAGNOSIS — M25512 Pain in left shoulder: Secondary | ICD-10-CM | POA: Diagnosis not present

## 2016-01-20 DIAGNOSIS — M6281 Muscle weakness (generalized): Secondary | ICD-10-CM

## 2016-01-20 DIAGNOSIS — R293 Abnormal posture: Secondary | ICD-10-CM

## 2016-01-20 NOTE — Therapy (Signed)
[facility] [address]  [place], [name], [address] Phone: [phone]   Fax:  [phone]  Occupational Therapy Treatment  Patient Details  [name] MRN: [id number] Date of Birth: 05/05/1956 Referring Provider: Dr. Amedeo Plenty  Encounter Date: 01/20/2016      OT End of Session - 01/20/16 1244    Visit Number 9   Number of Visits 15   Date for OT Re-Evaluation 02/17/16   Authorization Type UHC/TRICARE - G code needed   Authorization Time Period week 1/4   Authorization - Visit Number 9   Authorization - Number of Visits 10   OT Start Time [id number]   OT Stop Time 1100   OT Time Calculation (min) 45 min   Activity Tolerance Patient tolerated treatment well      Past Medical History  Diagnosis Date  . Unspecified hypothyroidism   . Cough   . Acute bronchitis   . Obstructive sleep apnea (adult) (pediatric)   . Coronary atherosclerosis   . Anginal pain ([place])   . Hypertension   . Type II or unspecified type diabetes mellitus without mention of complication, not stated as uncontrolled     type 2  . Arthritis     osteo  . Anemia     hx of anemia    Past Surgical History  Procedure Laterality Date  . Tubal ligation    . Anterior fusion cervical spine    . Wisdom tooth extraction    . Left heart catheterization with coronary angiogram N/A 05/29/2014    Procedure: LEFT HEART CATHETERIZATION WITH CORONARY ANGIOGRAM;  Surgeon: Sinclair Grooms, MD;  Location: [facility] CATH LAB;  Service: Cardiovascular;  Laterality: N/A;    There were no vitals filed for this visit.      Subjective Assessment - 01/20/16 1025    Subjective  The pain was about a 6/10 last night and kept me awake   Pertinent History Anterior cervical fusion 2009, Rt rotator cuff tear (chronic)    Patient Stated Goals reduce pain   Currently in Pain? Yes   Pain Score 3    Pain Location Shoulder   Pain Orientation Left   Pain  Descriptors / Indicators Aching   Pain Type Chronic pain   Pain Onset More than a month ago   Pain Frequency Constant   Aggravating Factors  driving bus, sleeping on Lt side too long   Pain Relieving Factors estim, heat, [place], exercises                      OT Treatments/Exercises (OP) - 01/20/16 0001    Neurological Re-education Exercises   Other Exercises 1 Pt able to perform bilateral horizontal abduction with yellow theraband x 10 reps with min tactile cues for Lt scapula to prevent compensation. However, pt with noted less compensations than when attempting initially a few weeks ago. Progressed to chair push ups for scapula depression - pt favored RUE minimally but when cued to perform both sides equally, pt could control.    Other Exercises 2 Standing with BUE high shoulder flexion on physioball against wall for scapula retraction ex's during high flexion. Pt able to perform, however noted increased use of upper traps/levator scapulae muscles to perform   Electrical Stimulation   Electrical Stimulation Location Lt levator scapulae   Electrical Stimulation Action Pre-mod with hot pack to Lt shoulder simultaneously   Electrical Stimulation Parameters 80/150 Hz, continuous  x 15 min.    Electrical Stimulation Goals Pain   Manual Therapy   Manual Therapy Soft tissue mobilization   Manual therapy comments Lt upper traps. levator scapulae, and rhomboids   Soft tissue mobilization STM's to LT upper traps, levator scapulae, and rhomboids followed by trigger point release                     OT Long Term Goals - 01/18/16 1123    OT LONG TERM GOAL #1   Title Independent with updated HEP (All LTG's due7/12/17)    Time 4   Period Weeks   Status On-going  issued, needs re-inforcement for renewal beginning 01/18/16   OT LONG TERM GOAL #2   Title Pt to report pain less than or equal to 5/10 with work related tasks and lesiure activities   Baseline 8/10   Time 4    Period Weeks   Status Achieved   OT LONG TERM GOAL #3   Title Pt to decrease disability from 38% to 25% or less on Quick Dash   Baseline eval = 38%, renewal 01/18/16 = 32%   Time 4   Period Weeks   Status On-going  ongoing for renewal period beginning 01/18/16   OT LONG TERM GOAL #4   Title Pain Lt shoulder to be 3/10 or under for daily activities    Baseline up to 5/10    Time 4   Period Weeks   Status New  new goal for renewal period beginning 01/18/16   OT LONG TERM GOAL #5   Title Pt to perform bilateral overhead reaching to retrieve/replace 5 lb. or > objects from high shelf consistently   Time 4   Period Weeks   Status New  new goal for renewal period beginning 01/18/16               Plan - 01/20/16 1245    Clinical Impression Statement Pt with less compensations at Lt scapula - noted less winging and elevation with exercises today. Pt reports overall decreased pain Lt shoulder   Rehab Potential Good   Clinical Impairments Affecting Rehab Potential chronic (time since onset)   OT Frequency 2x / week   OT Duration 4 weeks   OT Treatment/Interventions Therapeutic exercise;Patient/family education;Neuromuscular education;Ultrasound;Manual Therapy;DME and/or AE instruction;Cryotherapy;Therapeutic activities;Electrical Stimulation;Moist Heat;Passive range of motion   Plan G code and PN due - (see 01/18/16 note for latest updates)    Consulted and Agree with Plan of Care Patient      Patient will benefit from skilled therapeutic intervention in order to improve the following deficits and impairments:  Decreased range of motion, Improper spinal/pelvic alignment, Impaired UE functional use, Pain, Decreased strength  Visit Diagnosis: Pain in left shoulder  Abnormal posture  Muscle weakness (generalized)    Problem List Patient Active Problem List   Diagnosis Date Noted  . Acute bronchitis 08/10/2014  . Unstable angina ([place]) 05/29/2014  . Abnormal nuclear stress  test 05/29/2014  . Chest pain 05/06/2014  . Exertional shortness of breath 05/06/2014  . Mixed hyperlipidemia 05/06/2014  . Hyperlipidemia with target LDL less than 70 08/05/2013  . HTN (hypertension) 08/05/2013  . Chest wall pain 08/05/2013  . SINUSITIS, ACUTE 04/07/2009  . HYPOTHYROIDISM 12/10/2007  . DIABETES MELLITUS, TYPE II 05/28/2007  . Obstructive sleep apnea 05/28/2007  . Coronary atherosclerosis 05/28/2007  . Seasonal and perennial allergic rhinitis 05/28/2007  . COUGH, CHRONIC 05/28/2007    Carey Bullocks, OTR/L 01/20/2016, 12:47  PM  [facility] [address]  [place], [name], [address] Phone: [phone]   Fax:  [phone]  [name] MRN: [id number] Date of Birth: July 16, 1956

## 2016-01-25 ENCOUNTER — Encounter: Payer: Self-pay | Admitting: Occupational Therapy

## 2016-01-25 ENCOUNTER — Ambulatory Visit: Payer: 59 | Admitting: Occupational Therapy

## 2016-01-25 DIAGNOSIS — R293 Abnormal posture: Secondary | ICD-10-CM

## 2016-01-25 DIAGNOSIS — M25512 Pain in left shoulder: Secondary | ICD-10-CM | POA: Diagnosis not present

## 2016-01-25 DIAGNOSIS — M6281 Muscle weakness (generalized): Secondary | ICD-10-CM

## 2016-01-25 NOTE — Therapy (Signed)
[facility] [address]  [place], [name], [address] Phone: [phone]   Fax:  [phone]  Occupational Therapy Treatment  Patient Details  [name] MRN: [id number] Date of Birth: 04-Feb-1956 Referring Provider: Dr. Amedeo Plenty  Encounter Date: 01/25/2016      OT End of Session - 01/25/16 1108    Visit Number 10   Number of Visits 15   Date for OT Re-Evaluation 02/17/16   Authorization Type UHC/TRICARE - G code needed   Authorization Time Period week 2/4   Authorization - Visit Number 10   Authorization - Number of Visits 10   OT Start Time [id number]   OT Stop Time 1100   OT Time Calculation (min) 45 min   Activity Tolerance Patient tolerated treatment well      Past Medical History  Diagnosis Date  . Unspecified hypothyroidism   . Cough   . Acute bronchitis   . Obstructive sleep apnea (adult) (pediatric)   . Coronary atherosclerosis   . Anginal pain ([place])   . Hypertension   . Type II or unspecified type diabetes mellitus without mention of complication, not stated as uncontrolled     type 2  . Arthritis     osteo  . Anemia     hx of anemia    Past Surgical History  Procedure Laterality Date  . Tubal ligation    . Anterior fusion cervical spine    . Wisdom tooth extraction    . Left heart catheterization with coronary angiogram N/A 05/29/2014    Procedure: LEFT HEART CATHETERIZATION WITH CORONARY ANGIOGRAM;  Surgeon: Sinclair Grooms, MD;  Location: [facility] CATH LAB;  Service: Cardiovascular;  Laterality: N/A;    There were no vitals filed for this visit.      Subjective Assessment - 01/25/16 1016    Subjective  I don't have any pain right now, but it kept me up last night after working a lot yesterday   Pertinent History Anterior cervical fusion 2009, Rt rotator cuff tear (chronic)    Patient Stated Goals reduce pain   Currently in Pain? Yes   Pain Score 7    Pain Location Shoulder   Pain Descriptors / Indicators Aching   Pain Type Chronic pain   Pain Onset More than a month ago   Pain Frequency Constant   Aggravating Factors  driving bus, sleeping on Lt side too long   Pain Relieving Factors estim, heat, exercises                      OT Treatments/Exercises (OP) - 01/25/16 0001    Neck Exercises: Stretches   Upper Trapezius Stretch 5 reps;20 seconds   Other Neck Stretches levator scapulae stretch 5 reps, holding 20 sec.    Data processing manager Pre-mod with hot pack to Lt shoulder simultaneously   Electrical Stimulation Parameters 80/150 Hz, continuous, x 15 min   Electrical Stimulation Goals Pain   Manual Therapy   Soft tissue mobilization STM's to LT upper traps, levator scapulae, and rhomboids followed by trigger point release                OT Education - 01/25/16 1055    Education provided Yes   Education Details Upper traps and levator scapulae stretch HEP    Person(s) Educated Patient   Methods Explanation;Demonstration;Handout   Comprehension  Verbalized understanding;Returned demonstration             OT Long Term Goals - 01/18/16 1123    OT LONG TERM GOAL #1   Title Independent with updated HEP (All LTG's due7/12/17)    Time 4   Period Weeks   Status On-going  issued, needs re-inforcement for renewal beginning 01/18/16   OT LONG TERM GOAL #2   Title Pt to report pain less than or equal to 5/10 with work related tasks and lesiure activities   Baseline 8/10   Time 4   Period Weeks   Status Achieved   OT LONG TERM GOAL #3   Title Pt to decrease disability from 38% to 25% or less on Quick Dash   Baseline eval = 38%, renewal 01/18/16 = 32%   Time 4   Period Weeks   Status On-going  ongoing for renewal period beginning 01/18/16   OT LONG TERM GOAL #4   Title Pain Lt shoulder to be 3/10 or under for daily activities    Baseline  up to 5/10    Time 4   Period Weeks   Status New  new goal for renewal period beginning 01/18/16   OT LONG TERM GOAL #5   Title Pt to perform bilateral overhead reaching to retrieve/replace 5 lb. or > objects from high shelf consistently   Time 4   Period Weeks   Status New  new goal for renewal period beginning 01/18/16               Plan - 02-11-16 1109    Clinical Impression Statement Pt still reports overall decreased pain, but pain increases with overuse and/or sleeping on Lt side (which is way pt normally sleeps)    Clinical Impairments Affecting Rehab Potential chronic (time since onset)   OT Frequency 2x / week   OT Duration 4 weeks   OT Treatment/Interventions Therapeutic exercise;Patient/family education;Neuromuscular education;Ultrasound;Manual Therapy;DME and/or AE instruction;Cryotherapy;Therapeutic activities;Electrical Stimulation;Moist Heat;Passive range of motion   Plan ultrasound Lt upper traps/levator area, continue strengthening Lt scapula   Consulted and Agree with Plan of Care Patient      Patient will benefit from skilled therapeutic intervention in order to improve the following deficits and impairments:  Decreased range of motion, Improper spinal/pelvic alignment, Impaired UE functional use, Pain, Decreased strength  Visit Diagnosis: Pain in left shoulder  Abnormal posture  Muscle weakness (generalized)      G-Codes - Feb 11, 2016 1111    Functional Assessment Tool Used Quick Dash 32%   Functional Limitation Carrying, moving and handling objects   Carrying, Moving and Handling Objects Current Status [id number]) At least 20 percent but less than 40 percent impaired, limited or restricted   Carrying, Moving and Handling Objects Goal Status [id number]) At least 1 percent but less than 20 percent impaired, limited or restricted      Problem List Patient Active Problem List   Diagnosis Date Noted  . Acute bronchitis 08/10/2014  . Unstable angina ([place])  05/29/2014  . Abnormal nuclear stress test 05/29/2014  . Chest pain 05/06/2014  . Exertional shortness of breath 05/06/2014  . Mixed hyperlipidemia 05/06/2014  . Hyperlipidemia with target LDL less than 70 08/05/2013  . HTN (hypertension) 08/05/2013  . Chest wall pain 08/05/2013  . SINUSITIS, ACUTE 04/07/2009  . HYPOTHYROIDISM 12/10/2007  . DIABETES MELLITUS, TYPE II 05/28/2007  . Obstructive sleep apnea 05/28/2007  . Coronary atherosclerosis 05/28/2007  . Seasonal and perennial allergic rhinitis 05/28/2007  . COUGH, CHRONIC  05/28/2007      Occupational Therapy Progress Note  Dates of Reporting Period: 12/09/15 to 01/25/16  Objective Reports of Subjective Statement: Pt reports pain is overall better Lt shoulder and positive response to modalities and exercises in O.T.   Objective Measurements: Pain now average 3/10 (at eval 8/10), can go up to 7/10 but only when sleeping on it wrong or overdoing functional tasks. Pt has also reduced disability from 38% to 32% on Quick Dash  Goal Update: see above LTG section for latest updates from renewal on 01/18/16  Plan: Continue modalities, manual therapy, stretches and exercises  Reason Skilled Services are Required: O.T. Continues to make progress towards pain reduction, and scapula strengthening/stabalization to reduce compensations on Lt side. Pt continues to show progress in pain and less scapula winging noted.    Carey Bullocks, OTR/L  01/25/2016, 11:12 AM  [facility] [address]  [place], [name], [address] Phone: [phone]   Fax:  [phone]  [name] MRN: [id number] Date of Birth: Mar 19, 1956

## 2016-01-25 NOTE — Patient Instructions (Signed)
Flexibility: Upper Trapezius Stretch    Gently grasp right side of head while reaching behind back with other hand. Tilt head away until a gentle stretch is felt. Hold _20___ seconds. Repeat __5__ times per set.  Do __3__ sessions per day.   Neck Forward Flexion    Exhaling, lower chin toward chest and turn slightly to RT. Hold position for _5__ breaths. Inhaling, lift head back to center. Repeat __5_ times. Do _3__ times per day.  Copyright  VHI. All rights reserved.

## 2016-01-27 ENCOUNTER — Ambulatory Visit (INDEPENDENT_AMBULATORY_CARE_PROVIDER_SITE_OTHER): Payer: 59 | Admitting: Internal Medicine

## 2016-01-27 ENCOUNTER — Other Ambulatory Visit (INDEPENDENT_AMBULATORY_CARE_PROVIDER_SITE_OTHER): Payer: 59

## 2016-01-27 ENCOUNTER — Encounter: Payer: Self-pay | Admitting: Internal Medicine

## 2016-01-27 VITALS — BP 114/76 | HR 71 | Ht 62.0 in | Wt 172.6 lb

## 2016-01-27 DIAGNOSIS — R0602 Shortness of breath: Secondary | ICD-10-CM | POA: Diagnosis not present

## 2016-01-27 DIAGNOSIS — G4733 Obstructive sleep apnea (adult) (pediatric): Secondary | ICD-10-CM

## 2016-01-27 DIAGNOSIS — J309 Allergic rhinitis, unspecified: Secondary | ICD-10-CM | POA: Diagnosis not present

## 2016-01-27 DIAGNOSIS — J310 Chronic rhinitis: Secondary | ICD-10-CM

## 2016-01-27 DIAGNOSIS — J3089 Other allergic rhinitis: Secondary | ICD-10-CM

## 2016-01-27 DIAGNOSIS — J302 Other seasonal allergic rhinitis: Secondary | ICD-10-CM

## 2016-01-27 LAB — SEDIMENTATION RATE: SED RATE: 31 mm/h — AB (ref 0–30)

## 2016-01-27 NOTE — Assessment & Plan Note (Signed)
She describes a persistent rhinosinusitis with "olive" colored nasal discharge, but reports no findings by Dr. Constance Holster and CT scan was unremarkable. This may be some other inflammation. Plan-Allergy profile, sedimentation rate, CBC, ANCA ACE level considering possibilities of sarcoid or granulomatous inflammation. I suggested we ask Dr. Radene Journey for his opinion.

## 2016-01-27 NOTE — Progress Notes (Signed)
Patient ID: [name], female    DOB: 03-Feb-1956, 60 y.o.   MRN: [id number]  HPI 11/08/10- 72 yo F former smoker, followed here for obstructive sleep apnea and for hx bronchitis.  She was recently treated by her PCP for acute bronchitis. Onset after mowing grass. Still coughing up thick greenish yellow mucus after augmentin they gave her. She thinks she does better with doxycycline. Denies fever, swollen glands, sore throat.  Last seen here May 10, 2010. Hx of allergy testing but never on vaccine. Pollen is bothering with nasal congestion and wheezey cough more this year.  Continues CPAP at 13 cwp all night every night.  Sleeps ok. She asks about alternatives and we discussed palatal surgeries, oral appliances and Provent nasal valves. [name] Twin sister had MI.  05/10/11- 89 yo F former smoker, followed here for obstructive sleep apnea and for hx bronchitis, allergic rhinitis, complicated by DM, hypothyroid, CAD  Since last here, Dr. Claiborne Billings placed a coronary stent. 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  CPAP compliance is good all night every night. She isn't sure about mask and we discussed going to [facility] to look at alternatives.  Wants flu vac. She still feels tight in the chest that she continues Advair 250. Some wheeze. Uses rescue inhaler and asks about getting a nebulizer machine. We had called in a Z-Pak which she says was not strong enough. She denies discolored sputum or fever. We discussed using cortisone and anticipated impact on her insulin-dependent diabetes temporarily. She complains of a "raw" substernal sensation but thinks Nexium controls her reflux well.  08/29/11-  59 yo F former smoker, followed here for obstructive sleep apnea and for hx bronchitis, allergic rhinitis, complicated by DM, hypothyroid, CAD  Acute visit-  deep cough-yellow in color and streaks of blood at times; SOB and wheezing-went to ER Saturday([name])Lower  back pain  she did have flu vaccine. Last week had GI upset with vomiting and right upper quadrant pain. She still has her gallbladder. That discomfort has eased off. 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 As her GI upset resolved, she developed chest tightness with cough and cold symptoms, fever and chills 3 days ago now resolved. Head and chest are still congested. [name] his nebulizer treatment x2 started prednisone taper. She  Is finishing doxycycline with 2 days left. CHEST - 2 VIEW 08/27/11-  Comparison: 05/10/2011  Findings: Mild peribronchial thickening. Heart and mediastinal  contours are within normal limits. No focal opacities or  effusions. No acute bony abnormality.  IMPRESSION:  Mild bronchitic changes.  Original Report Authenticated By: [name], M.D.   11/08/11- 32 yo F former smoker, followed here for obstructive sleep apnea and for hx bronchitis, allergic rhinitis, complicated by DM, hypothyroid, CAD  Continues using CPAP 13 almost every night. No sleeping pills. Nebulizer treatment and Depo-Medrol did help her breathing last visit. She would like a home nebulizer which we discussed. Singulair did not help. Blames her cough on postnasal drip or getting too hot. Has history of occasional thrush which might also contribute to throat discomfort.   05/11/2012 Acute OV  Complains of head congestion w/ green drainage, PND, prod cough with gree mucus, hoarseness, increased SOB, wheezing, some tightness x2 weeks - denies f/c/s.  has finished abx and pred thru PCP w/in last 2 weeks Took keflex and steroid taper with minimal  improvement. Has sinus congestion, ear fullness, hoarseness and drainage.  No hemotpysis or chest pain  No edema.  OTC not helping with cough.   06/29/12- 89 yo F former smoker, followed here for obstructive sleep apnea and for hx bronchitis, allergic rhinitis, complicated by DM, hypothyroid,  CAD  FOLLOWS FOR: still having cough and feels fullness in chest coming back since seeign TP.; discomfort on right side lung area CPAP 13 Feeling better after she was seen by the nurse practitioner October 4. Now has caught a new cold. Sneezing. Hard cough caused sharp pain right lateral rib. Area still hurts to cough. Scant phlegm, mild sore throat, no fever but did have some chills originally. Took Augmentin then amoxicillin used for dental prophylaxis. CXR 05/11/12-reviewed IMPRESSION:  No acute cardiopulmonary abnormality.  Original Report Authenticated By: [name], M.D.   11/06/12- 19 yo F former smoker, followed here for obstructive sleep apnea and for hx bronchitis, allergic rhinitis, complicated by DM, hypothyroid, CAD , OSA FOLLOW FOR:c/o sinus congestion,chest tightness x 4 days,mild sob w exertion,,cough and nasal congestion yellow,,right ear stopped up and hurting,Finished mouthwash that was called in 2 wks. Ago Eyes itch, postnasal drip, malaise x 4 days. Amoxacillin 2 weeks ago for ?strep throat. Diazepam for cramps.  CPAP 13/ [facility].   06/18/13- 25 yo F former smoker, followed here for obstructive sleep apnea and for hx bronchitis, allergic rhinitis, complicated by DM, hypothyroid, CAD , OSA FOLLOWS FOR: Breathing is slightly improved. Reports SOB and wheezing. Denies chest tightness today. Is producing green mucus from nose. Denies fever or sore throat. Using Mucinex and saline rinse. Had flu vaccine. CPAP 13/ [facility] is not being used as much as she should. Using Nexium, feels no reflux or heartburn  01/23/14- 40 yo F former smoker, followed here for obstructive sleep apnea and for hx bronchitis, allergic rhinitis, complicated by DM, hypothyroid, CAD , OSA FOLLOWS FOR:  Having increased sob with exertion, wheezing and cough with yellow mucus x1 month.  Pt has done round of anitbiotics and pred shot twice. CPAP/13/[facility] Onset of sinusitis in March with extension to  chest/bronchitis. Urgent care treated with steroid shot, antibiotic shot and then cephalosporin. She started itching attributed to the antibiotic. Chest x-ray was clear. She was given repeat steroid and antibiotic injections. Still blowing green from her nose. Using Sudafed and saline nasal rinse.no fever or headache recognized CXR 09/02/13 IMPRESSION:  No acute disease.  Electronically Signed  By: Inge Rise M.D.  On: 09/02/2013 20:28  08/04/14- 44 yo F former smoker, followed here for obstructive sleep apnea and for hx bronchitis, allergic rhinitis, complicated by DM, hypothyroid, CAD , OSA FOLLOWS FOR: DME [id number] had respiratory infection over Thanksgiving. Dr. Forde Dandy gave abx (Augmentin) last week and she feels better. She got steroid injection by Dr. Amedeo Plenty last week as well. Patient still on abx, she still has sl cough and wheeze at times. She denies sob at this time . Abnl nuclear stress test then cath- CAD/ stents, no progression CPAP 13/ [facility]- all night every night  12/08/14- 52 yo F former smoker, followed here for obstructive sleep apnea and for hx bronchitis, allergic rhinitis, complicated by DM, hypothyroid, CAD , OSA Follows for: prod cough at times w/yellow mucus; hoarseness; feels like back of throat is "gritty"; feels R side stopped up; x 1 mth CPAP 13/ [facility]- all night every night Gabapentin some help with chronic cough at 100 mg 3 times a day. Right ear itches. Cough worse at night,  lying down or if hot or humid.  07/10/2015-60 year old female former smoker followed for OSA, chronic bronchitis/chronic cough, allergic rhinitis, complicated by DM, hypothyroid, CAD CPAP 13/[facility] FOLLOWS FOR: Pt states cough-productive-yellow in color has come back; SOB and wheezing as well. Had chills Wednesday night but none since-did not check for fever. Waxing and waning malaise and cough, some recurrent chills and sneezing without fever or purulent sputum. Augmentin had seemed  to help but symptoms never completely cleared and Zithromax did little. Cough with scant phlegm. This all began as a sinus infection. Continues CPAP with good compliance and control described, 21  09/14/2015-60 year old female former smoker followed for OSA, chronic bronchitis/chronic cough, allergic rhinitis, complicated by DM, hypothyroid, CAD CPAP 13/[facility] Seen by nurse practitioner 07/21/2015 after ER visit for sinusitis and bronchitis treated with Avelox, prednisone, then required Diflucan for yeast. ACUTE VISIT: Per Dr Luan Moore continues to have nasal issues with it draining to her chest causing a cough-was given Hydrocodone cough syrup to help with cough at night. Pt takes abx and clears up for about 1 week then comes right back and worsens each time. CXR 07/12/2015-stable with chronic scarring right middle lobe, NAD. Recurrent head congestion, green nasal discharge, full feeling with some frontal and maxillary sinus pressure pain if she leans over. Scant yellow sputum from chest. No distinct fever or chills. Ears little stuffy, hearing okay. We spent some time discussing previous treatments for this issue. Extended visit  12/14/2015-60 year old female former smoker followed for OSA, chronic bronchitis/chronic cough, allergic rhinitis, currently by DM, hypothyroid, CAD CPAP 13/[facility] FENO 12/14/15- 22 ( nl 0-25) PFT-pending 5/12 CXR 12/08/2015-NAD, borderline cardiomegaly, lungs clear ACUTE VISIT: Pt here for Feno test. Pt states she continues to have SOB with heart rate issues, feet swelling as well as her hands. Pt has PFT and ECHO scheduled for 12-22-15.   01/27/2016-60 year old female former smoker followed for OSA, chronic bronchitis/chronic cough, allergic rhinitis, complicated by DM 2, hypothyroid, CAD CPAP 13/[facility] FOLLOWS FOR: Review PFT and Echo results with patient; Pt states she is having nasal concerns-has been on several abx's and nothing helps. Continues to feel  congested. Echo-mild diastolic dysfunction [id number] 2017-minimal obstructive airways disease. Moderate diffusion defect 60%. Insignificant response to bronchodilator. She complains of persistent pressure/stuffiness and eustachian, retro-orbital and nasal areas. Describes blowing "olive green" mucus from nose. No response to Augmentin. Mucus began to clear near the end of 10 day course of clindamycin, but then got dark again before she finished. She saw ENT/Dr. Constance Holster who reportedly didn't find anything wrong. CT of sinuses in February was clear. She is convinced there is something wrong. She continues Flonase, nasal saline rinse with a syringe bulb.  ROS-see HPI Constitutional:   No-   weight loss, night sweats, fevers, chills, fatigue, lassitude. HEENT:   No-  headaches, difficulty swallowing, tooth/dental problems, sore throat,      No-sneezing, itching, ear ache, +nasal congestion, + post nasal drip,  CV:  No- chest pain, no-orthopnea, PND, swelling in lower extremities, anasarca,  dizziness, palpitations Resp: No-   shortness of breath with exertion or at rest.            +  productive cough,  + non-productive cough,  No- coughing up of blood.              No-change in color of mucus.  No- wheezing.   Skin: No-   rash or lesions. GI:  No-   heartburn, indigestion, abdominal pain, nausea, vomiting,  GU: .  MS:  No-   joint pain or swelling.  . Neuro-     nothing unusual Psych:  No- change in mood or affect. No depression or anxiety.  No memory loss.  Objective:   Physical Exam OBJ- Physical Exam General- Alert, Oriented, Affect-appropriate, Distress- none acute Skin- rash-none, lesions- none, excoriation- none Lymphadenopathy- none Head- atraumatic            Eyes- + periorbital edema, Gross vision intact, PERRLA, conjunctivae and secretions clear            Ears- Hearing, canals-normal,             Nose- No- turb edema,  no-Septal dev, mucus bridging +, polyps, erosion, perforation              Throat- Mallampati II , mucosa clear , drainage- none, tonsils- atrophic Neck- flexible , trachea midline, no stridor , thyroid nl, carotid no bruit Chest - symmetrical excursion , unlabored           Heart/CV- RRR , no murmur , no gallop  , no rub, nl s1 s2                           - JVD- none , edema- none, stasis changes- none, varices- none           Lung-  +Coarse breath sounds, but No-wheeze,  cough +, dullness-none, rub- none           Chest wall-  Abd-  Br/ Gen/ Rectal- Not done, not indicated Extrem- cyanosis- none, clubbing, none, atrophy- none, strength- nl Neuro- grossly intact to observation

## 2016-01-27 NOTE — Assessment & Plan Note (Signed)
She reports that she remains compliant with CPAP and is comfortable wearing it with no breakthrough snoring. Has skipped it only during head colds.

## 2016-01-27 NOTE — Patient Instructions (Signed)
Order- lab- Allergy profile, Sed rate, ANA, ANCA, ACE level       Dx chronic rhinitis  Order- referral to ENT Dr Radene Journey     Second opinion chronic rhinosinu  Download or AirView   For CPAP review

## 2016-01-27 NOTE — Assessment & Plan Note (Signed)
Minimal obstructive airways disease, mild diastolic dysfunction, no major abnormalities. She needs to continue to follow with her cardiologist and maintaining some exercise for stamina.

## 2016-01-28 LAB — RESPIRATORY ALLERGY PROFILE REGION II ~~LOC~~
ALLERGEN, CEDAR TREE, T6: 6.89 kU/L — AB
ALLERGEN, COTTONWOOD, T14: 9.66 kU/L — AB
ALLERGEN, D PTERNOYSSINUS, D1: 0.21 kU/L — AB
ALLERGEN, MOUSE U PROTEIN, E72: 0.14 kU/L — AB
ASPERGILLUS FUMIGATUS M3: 0.13 kU/L — AB
Allergen, Comm Silver Birch, t9: 6.31 kU/L — ABNORMAL HIGH
Allergen, Mulberry, t76: 5.68 kU/L — ABNORMAL HIGH
Allergen, Oak,t7: 8.36 kU/L — ABNORMAL HIGH
Alternaria Alternata: 7.91 kU/L — ABNORMAL HIGH
BERMUDA GRASS: 9.46 kU/L — AB
Box Elder IgE: 8.77 kU/L — ABNORMAL HIGH
CAT DANDER: 0.21 kU/L — AB
COMMON RAGWEED: 8.89 kU/L — AB
Cladosporium Herbarum: 0.11 kU/L — ABNORMAL HIGH
Cockroach: 4.78 kU/L — ABNORMAL HIGH
D. farinae: 1.13 kU/L — ABNORMAL HIGH
Dog Dander: 0.36 kU/L — ABNORMAL HIGH
Elm IgE: 8.89 kU/L — ABNORMAL HIGH
IgE (Immunoglobulin E), Serum: 412 kU/L — ABNORMAL HIGH (ref <115)
Johnson Grass: 8.58 kU/L — ABNORMAL HIGH
PENICILLIUM NOTATUM: 0.11 kU/L — AB
Pecan/Hickory Tree IgE: 7.48 kU/L — ABNORMAL HIGH
Rough Pigweed  IgE: 8.53 kU/L — ABNORMAL HIGH
Sheep Sorrel IgE: 8.12 kU/L — ABNORMAL HIGH
TIMOTHY GRASS: 9.22 kU/L — AB

## 2016-01-28 LAB — ANCA SCREEN W REFLEX TITER: ANCA Screen: NEGATIVE

## 2016-01-28 LAB — ANA: Anti Nuclear Antibody(ANA): NEGATIVE

## 2016-01-28 LAB — ANGIOTENSIN CONVERTING ENZYME: Angiotensin-Converting Enzyme: 23 U/L (ref 8–52)

## 2016-02-10 ENCOUNTER — Encounter: Payer: 59 | Admitting: Occupational Therapy

## 2016-02-15 ENCOUNTER — Ambulatory Visit: Payer: 59 | Admitting: Occupational Therapy

## 2016-02-17 ENCOUNTER — Ambulatory Visit: Payer: 59 | Admitting: Occupational Therapy

## 2016-02-29 ENCOUNTER — Encounter: Payer: Self-pay | Admitting: Occupational Therapy

## 2016-02-29 ENCOUNTER — Ambulatory Visit: Payer: 59 | Attending: Orthopedic Surgery | Admitting: Occupational Therapy

## 2016-02-29 DIAGNOSIS — M25512 Pain in left shoulder: Secondary | ICD-10-CM | POA: Diagnosis present

## 2016-02-29 DIAGNOSIS — M25511 Pain in right shoulder: Secondary | ICD-10-CM | POA: Insufficient documentation

## 2016-02-29 DIAGNOSIS — R293 Abnormal posture: Secondary | ICD-10-CM | POA: Diagnosis present

## 2016-02-29 NOTE — Therapy (Signed)
[facility] [address]  [place], [name], [address] Phone: [phone]   Fax:  [phone]  Occupational Therapy Treatment  Patient Details  [name] MRN: [id number] Date of Birth: Aug 16, 1955 Referring Provider: Dr. Amedeo Plenty  Encounter Date: 02/29/2016      OT End of Session - 02/29/16 1022    Visit Number 11   Number of Visits 15   Date for OT Re-Evaluation 03/17/16   Authorization Type [employer] - G code needed   Authorization Time Period week 3/4   Authorization - Visit Number 11   Authorization - Number of Visits 20   OT Start Time [id number]   OT Stop Time 1030   OT Time Calculation (min) 55 min   Activity Tolerance Patient tolerated treatment well      Past Medical History:  Diagnosis Date  . Acute bronchitis   . Anemia    hx of anemia  . Anginal pain ([place])   . Arthritis    osteo  . Coronary atherosclerosis   . Cough   . Hypertension   . Obstructive sleep apnea (adult) (pediatric)   . Type II or unspecified type diabetes mellitus without mention of complication, not stated as uncontrolled    type 2  . Unspecified hypothyroidism     Past Surgical History:  Procedure Laterality Date  . ANTERIOR FUSION CERVICAL SPINE    . LEFT HEART CATHETERIZATION WITH CORONARY ANGIOGRAM N/A 05/29/2014   Procedure: LEFT HEART CATHETERIZATION WITH CORONARY ANGIOGRAM;  Surgeon: Sinclair Grooms, MD;  Location: [facility] CATH LAB;  Service: Cardiovascular;  Laterality: N/A;  . TUBAL LIGATION    . WISDOM TOOTH EXTRACTION      There were no vitals filed for this visit.      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 Yes   Pain Score 8   Rt shoulder 6-7/10   Pain Location Shoulder    Pain Orientation Left   Pain Descriptors / Indicators Aching   Pain Type Chronic pain   Pain Onset More than a month ago   Pain Frequency Constant   Aggravating Factors  driving bus   Pain Relieving Factors estim, heat, exercises                      OT Treatments/Exercises (OP) - 02/29/16 0001      Electrical Stimulation   Electrical Stimulation Location Lt levator scapulae   Electrical Stimulation Action Pre-mod with hot pack to Lt shoulder (hot pack to Rt shoulder simultaneously)    Electrical Stimulation Parameters 80/150 Hz, continous, x 15 minutes   Electrical Stimulation Goals Pain     Ultrasound   Ultrasound Location upper traps bilaterally   Ultrasound Parameters 1.2 wts/cm2, 1 Mhz, continuous x 12 minutes (6 minutes on each side)    Ultrasound Goals Pain     Manual Therapy   Soft tissue mobilization STM's to LT upper traps and levator scapulae   Kinesiotex Inhibit Muscle;Facilitate Muscle  to relax Lt upper traps and activate scapula retraction & ER                     OT Long Term Goals -  01/18/16 1123      OT LONG TERM GOAL #1   Title Independent with updated HEP (All LTG's due7/12/17)    Time 4   Period Weeks   Status On-going  issued, needs re-inforcement for renewal beginning 01/18/16     OT LONG TERM GOAL #2   Title Pt to report pain less than or equal to 5/10 with work related tasks and lesiure activities   Baseline 8/10   Time 4   Period Weeks   Status Achieved     OT LONG TERM GOAL #3   Title Pt to decrease disability from 38% to 25% or less on Quick Dash   Baseline eval = 38%, renewal 01/18/16 = 32%   Time 4   Period Weeks   Status On-going  ongoing for renewal period beginning 01/18/16     OT LONG TERM GOAL #4   Title Pain Lt shoulder to be 3/10 or under for daily activities    Baseline up to 5/10    Time 4   Period Weeks   Status New  new goal for renewal period beginning 01/18/16     OT LONG TERM GOAL #5    Title Pt to perform bilateral overhead reaching to retrieve/replace 5 lb. 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    Rehab Potential Good   Clinical Impairments Affecting Rehab Potential chronic (time since onset)   OT Frequency 2x / week   OT Duration 4 weeks   OT Treatment/Interventions Therapeutic exercise;Patient/family education;Neuromuscular education;Ultrasound;Manual Therapy;DME and/or AE instruction;Cryotherapy;Therapeutic activities;Electrical Stimulation;Moist Heat;Passive range of motion   Plan continue modalities prn, continue strengthening Lt scapula   Consulted and Agree with Plan of Care Patient      Patient will benefit from skilled therapeutic intervention in order to improve the following deficits and impairments:  Decreased range of motion, Improper spinal/pelvic alignment, Impaired UE functional use, Pain, Decreased strength  Visit Diagnosis: Pain in left shoulder  Pain in right shoulder  Abnormal posture    Problem List Patient Active Problem List   Diagnosis Date Noted  . Acute bronchitis 08/10/2014  . Unstable angina ([place]) 05/29/2014  . Abnormal nuclear stress test 05/29/2014  . Chest pain 05/06/2014  . Exertional shortness of breath 05/06/2014  . Mixed hyperlipidemia 05/06/2014  . Hyperlipidemia with target LDL less than 70 08/05/2013  . HTN (hypertension) 08/05/2013  . Chest wall pain 08/05/2013  . SINUSITIS, ACUTE 04/07/2009  . HYPOTHYROIDISM 12/10/2007  . DIABETES MELLITUS, TYPE II 05/28/2007  . Obstructive sleep apnea 05/28/2007  . Coronary atherosclerosis 05/28/2007  . Seasonal and perennial allergic rhinitis 05/28/2007  . COUGH, CHRONIC 05/28/2007     Carey Bullocks, OTR/L 02/29/2016, 10:45 AM  [facility] [address]  [place], [name], [address] Phone: [phone]   Fax:  [phone]  [name] MRN: [id number] Date of Birth: 1955/11/14

## 2016-03-02 ENCOUNTER — Ambulatory Visit: Payer: 59 | Admitting: Occupational Therapy

## 2016-03-07 ENCOUNTER — Ambulatory Visit: Payer: 59 | Admitting: Occupational Therapy

## 2016-03-09 ENCOUNTER — Ambulatory Visit: Payer: 59 | Admitting: Occupational Therapy

## 2016-03-21 ENCOUNTER — Encounter: Payer: Self-pay | Admitting: Occupational Therapy

## 2016-03-21 NOTE — Therapy (Signed)
[facility] [address]  [place], [name], [address] Phone: [phone]   Fax:  [phone]  Patient Details  [name] MRN: [id number] Date of Birth: [birth year] Referring Provider:  Dr. Amedeo Plenty  Encounter Date: 03/21/2016   OCCUPATIONAL THERAPY DISCHARGE SUMMARY  Visits from Start of Care: 11  Current functional level related to goals / functional outcomes:     OT Long Term Goals - 01/18/16 1123      OT LONG TERM GOAL #1   Title Independent with updated HEP (All LTG's due7/12/17)    Time 4   Period Weeks   Status Achieved     OT LONG TERM GOAL #2   Title Pt to report pain less than or equal to 5/10 with work related tasks and lesiure activities   Baseline 8/10   Time 4   Period Weeks   Status Achieved     OT LONG TERM GOAL #3   Title Pt to decrease disability from 38% to 25% or less on Quick Dash   Baseline eval = 38%, renewal 01/18/16 = 32%   Time 4   Period Weeks   Status Unable to assess secondary to not returning last week of therapy     OT LONG TERM GOAL #4   Title Pain Lt shoulder to be 3/10 or under for daily activities    Baseline up to 5/10    Time 4   Period Weeks   Status Not consistent     OT LONG TERM GOAL #5   Title Pt to perform bilateral overhead reaching to retrieve/replace 5 lb. or > objects from high shelf consistently   Time 4   Period Weeks   Status Unknown - unable to assess secondary to not returning last week of therapy        Remaining deficits: Pain left shoulder and neck Strength Lt scapula   Education / Equipment: HEP's, pain management strategies  Plan: Patient agrees to discharge.  Patient goals were partially met. Patient is being discharged due to not returning since the last visit.  Pt called on 03/16/16 and therapist discussed with patient that we are now outside [place] and Medicare timeframe. (Plan was to d/c the next attended week within the  timeframe anyway, however pt had cancelled several times d/t work schedule). Recommended to patient that she get P.T. Referral from MD to address neck pain and possibly traction to neck if appropriate. Pt agreed?????           Carey Bullocks, OTR/L 03/21/2016, 8:20 AM  [facility] [address]  [place], [name], [address] Phone: [phone]   Fax:  [phone]

## 2016-03-23 ENCOUNTER — Other Ambulatory Visit: Payer: Self-pay | Admitting: Internal Medicine

## 2016-03-25 ENCOUNTER — Telehealth: Payer: Self-pay | Admitting: Internal Medicine

## 2016-03-25 MED ORDER — BENZONATATE 200 MG PO CAPS: 200.0000 mg | ORAL_CAPSULE | Freq: Three times a day (TID) | ORAL | 1 refills | Status: DC | PRN

## 2016-03-25 NOTE — Telephone Encounter (Signed)
Patient notified of Dr. Janee Morn recommendations. Rx for tessalon perles sent to pharmacy. Nothing further needed.

## 2016-03-25 NOTE — Telephone Encounter (Signed)
Pt c/o increased cough with wheezing and SOB x 3-4 days. Pt reports burning sensation in her chest when she coughs. Pt taking Pseudofed and Flonase for sinus congestion and Mucinex for cough and chest burning.  Please advise Dr Annamaria Boots. Thanks.    Medication List       Accurate as of 03/25/16 11:40 AM. Always use your most recent med list.          ADVAIR DISKUS 250-50 MCG/DOSE Aepb Generic drug:  Fluticasone-Salmeterol INHALE ONE (1) PUFF(S) TWICE DAILY, RINSE MOUTH.   albuterol 108 (90 Base) MCG/ACT inhaler Commonly known as:  PROVENTIL HFA Inhale 2 puffs into the lungs every 6 (six) hours as needed for wheezing or shortness of breath. For shortness of breath and wheezing   albuterol (2.5 MG/3ML) 0.083% nebulizer solution Commonly known as:  PROVENTIL Take 3 mLs (2.5 mg total) by nebulization every 6 (six) hours as needed for wheezing or shortness of breath.   aspirin 81 MG chewable tablet Chew 1 tablet (81 mg total) by mouth daily.   atorvastatin 80 MG tablet Commonly known as:  LIPITOR Take 1 tablet Monday, Wednesday, and Friday   clopidogrel 75 MG tablet Commonly known as:  PLAVIX Take 1 tablet (75 mg total) by mouth daily.   diazepam 5 MG tablet Commonly known as:  VALIUM Take 0.5 tablets by mouth as needed. .5 to 1 tablet prn for muscle cramps   diclofenac 75 MG EC tablet Commonly known as:  VOLTAREN Take 75 mg by mouth daily as needed. For muscle aches   eszopiclone 2 MG Tabs tablet Commonly known as:  LUNESTA Take 1 tablet (2 mg total) by mouth at bedtime as needed for sleep. Take immediately before bedtime   INVOKANA 100 MG Tabs tablet Generic drug:  canagliflozin Take 100 mg by mouth daily before breakfast.   ipratropium 0.02 % nebulizer solution Commonly known as:  ATROVENT Take 2.5 mLs (0.5 mg total) by nebulization 4 (four) times daily.   LEVEMIR FLEXPEN [place] Inject 15 Units into the skin at bedtime.   levothyroxine 200 MCG tablet Commonly known as:   SYNTHROID, LEVOTHROID Take 200 mcg by mouth daily.   loratadine 10 MG tablet Commonly known as:  CLARITIN Take 1 tablet (10 mg total) by mouth daily.   metFORMIN 1000 MG (MOD) 24 hr tablet Commonly known as:  GLUMETZA Take 1 tablet (1,000 mg total) by mouth 2 (two) times daily with a meal. HOLD for 48 hours, restart on 06/01/2014.   metoprolol succinate 25 MG 24 hr tablet Commonly known as:  TOPROL-XL Take 1 tablet (25 mg total) by mouth daily.   NOVOTWIST 32G X 5 MM Misc Generic drug:  Insulin Pen Needle Use as directed   omega-3 acid ethyl esters 1 g capsule Commonly known as:  LOVAZA TAKE TWO CAPSULES BY MOUTH TWICE A DAY   ONE TOUCH ULTRA TEST test strip Generic drug:  glucose blood Use as directed   triamterene-hydrochlorothiazide 37.5-25 MG tablet Commonly known as:  MAXZIDE-25 Take 1 tablet by mouth daily.   verapamil 240 MG CR tablet Commonly known as:  CALAN-SR Take 1 tablet by mouth daily.      Allergies  Allergen Reactions  . Codeine     REACTION: hallucinations/"loopy"  . Levaquin [Levofloxacin] Nausea And Vomiting  . Sulfonamide Derivatives Hives  . Sulfa Antibiotics Hives

## 2016-03-25 NOTE — Telephone Encounter (Signed)
Try the perles.  She doesn't need to add Pepcid, but the burning sensation makes it likely that reflux of stomach juice is contributing to her cough. She should discuss this with her PCP or GI doctor.

## 2016-03-25 NOTE — Telephone Encounter (Signed)
Are the benzonatate perles good for her?   Could offer benzonatate 200 mg, # 50, 1 every 8 hours if needed for cough, ref x 3  If perles don't work well for her, then offer hydromet cough syrup, 150 ml,  5 ml every 6 hours if needed for cough  The burning indicates she may be refluxing, which can make cough worse. Suggest she take otc Pepcid 10 mg twice daily, before breakfast and supper.

## 2016-03-25 NOTE — Telephone Encounter (Signed)
Pt states that she has never taken [employer] but will give this a try.  Currently taking Protonix 40mg  daily and if having break thru reflux takes Alka-seltzer for reflux. Pt asking If she still needs to take the Pepcid 10mg  BID. (medication list updated)  Please advise if any change in rec's. Thanks.

## 2016-04-28 ENCOUNTER — Other Ambulatory Visit: Payer: Self-pay | Admitting: Otolaryngology

## 2016-04-28 DIAGNOSIS — J328 Other chronic sinusitis: Secondary | ICD-10-CM

## 2016-05-04 ENCOUNTER — Ambulatory Visit (INDEPENDENT_AMBULATORY_CARE_PROVIDER_SITE_OTHER): Payer: 59 | Admitting: Cardiovascular Disease

## 2016-05-04 VITALS — BP 120/76 | HR 74 | Ht 62.0 in | Wt 178.8 lb

## 2016-05-04 DIAGNOSIS — E039 Hypothyroidism, unspecified: Secondary | ICD-10-CM

## 2016-05-04 DIAGNOSIS — Z794 Long term (current) use of insulin: Secondary | ICD-10-CM

## 2016-05-04 DIAGNOSIS — R079 Chest pain, unspecified: Secondary | ICD-10-CM

## 2016-05-04 DIAGNOSIS — E119 Type 2 diabetes mellitus without complications: Secondary | ICD-10-CM

## 2016-05-04 DIAGNOSIS — E785 Hyperlipidemia, unspecified: Secondary | ICD-10-CM

## 2016-05-04 DIAGNOSIS — R0789 Other chest pain: Secondary | ICD-10-CM

## 2016-05-04 DIAGNOSIS — I10 Essential (primary) hypertension: Secondary | ICD-10-CM

## 2016-05-04 DIAGNOSIS — I2510 Atherosclerotic heart disease of native coronary artery without angina pectoris: Secondary | ICD-10-CM

## 2016-05-04 NOTE — Patient Instructions (Signed)
Your physician wants you to follow-up in: 1 Year. You will receive a reminder letter in the mail two months in advance. If you don't receive a letter, please call our office to schedule the follow-up appointment.  

## 2016-05-06 ENCOUNTER — Encounter: Payer: Self-pay | Admitting: Cardiovascular Disease

## 2016-05-06 NOTE — Progress Notes (Signed)
Patient ID: [name], female   DOB: Nov 01, 1955, 60 y.o.   MRN: [id number]      PCP: Dr. Forde Dandy  HPI: [name] is a 60 y.o. female who presents to the office for a 2 year cardiology evaluation.   [name] has known coronary artery disease and in 2007 underwent insertion of a 3.0x13 mm Cypher DES stent to her proximal RCA. She did have concomitant CAD with 60-70% stenosis of her LAD and 20% circumflex stenosis. In July 2010 she develop recurrent chest pain and repeat catheterization showed 60% LAD stenosis, 20-30% circumflex stenosis and a widely patent RCA stent.  She has a history of hypertension, hyperlipidemia, hypothyroidism, diabetes mellitus, asthma, GERD, as well as intermittent leg swelling.  When I saw her in December, 2014, she had experienced some episodes of chest pain, which sounded musculoskeletal.  However, with her cardiac risk factor profile, and known CAD .  I recommended a nuclear stress test, but she did not have that done.  I last saw her in September 2015 and ultimately recommended that she undergo the nuclear stress study.  This was interpreted as a high risk study revealing a large moderate intensity reversible distal anterior apical and inferior defect.  As result, she underwent cardiac catheterization in October 2015.  Her catheterization findings were essentially unchanged from previously and showed a widely patent RCA stent.  There was segmental 50% proximal to mid LAD stenoses with an FFR of 0.94.  There were irregularities in the circumflex.  She had normal LV function.  She sees Dr. Forde Dandy and had laboratory in January 2017.  This was notable in that her cholesterol was 191, triglycerides 267, HDL 49, and LDL 89.  Gross was elevated at 190.  Her TSH was 0.11 and free T4 1 0.5.  Subsequent blood work in May 2017 showed a BUN of 24, creatinine 1.19.  Hemoglobin 10.9, hematocrit 34.0.  On 01/06/2016 she underwent an echo Doppler study which showed an  ejection fraction of 60-65% with grade 1 diastolic dysfunction.  There was trivial AR.  Presently, she feels well.  She denies any episodes of exertional chest tightness, but does admit to rare episodes of chest wall discomfort which improves with aspirin.  She admits to occasional swelling of her ankles.  She also has a history of intermittent wheezing for which he takes Advair.  She presents for 2 year follow-up evaluation.  Past Medical History:  Diagnosis Date  . Acute bronchitis   . Anemia    hx of anemia  . Anginal pain ([place])   . Arthritis    osteo  . Coronary atherosclerosis   . Cough   . Hypertension   . Obstructive sleep apnea (adult) (pediatric)   . Type II or unspecified type diabetes mellitus without mention of complication, not stated as uncontrolled    type 2  . Unspecified hypothyroidism     Past Surgical History:  Procedure Laterality Date  . ANTERIOR FUSION CERVICAL SPINE    . LEFT HEART CATHETERIZATION WITH CORONARY ANGIOGRAM N/A 05/29/2014   Procedure: LEFT HEART CATHETERIZATION WITH CORONARY ANGIOGRAM;  Surgeon: Sinclair Grooms, MD;  Location: [facility] CATH [facility];  Service: Cardiovascular;  Laterality: N/A;  . TUBAL LIGATION    . WISDOM TOOTH EXTRACTION      Allergies  Allergen Reactions  . Codeine     REACTION: hallucinations/"loopy"  . Levaquin [Levofloxacin] Nausea And Vomiting  . Sulfa Antibiotics Hives  . Sulfonamide Derivatives Hives  Current Outpatient Prescriptions  Medication Sig Dispense Refill  . ADVAIR DISKUS 250-50 MCG/DOSE AEPB INHALE ONE (1) PUFF(S) TWICE DAILY, RINSE MOUTH. 60 each 5  . albuterol (PROVENTIL) (2.5 MG/3ML) 0.083% nebulizer solution Take 3 mLs (2.5 mg total) by nebulization every 6 (six) hours as needed for wheezing or shortness of breath. 75 mL 5  . aspirin 81 MG chewable tablet Chew 1 tablet (81 mg total) by mouth daily.    Marland Kitchen atorvastatin (LIPITOR) 80 MG tablet Take 1 tablet Monday, Wednesday, and Friday 36 tablet 3  . Calcium  Carbonate Antacid (ALKA-SELTZER ANTACID PO) Take by mouth daily as needed.    . clopidogrel (PLAVIX) 75 MG tablet Take 1 tablet (75 mg total) by mouth daily. 90 tablet 3  . diazepam (VALIUM) 5 MG tablet Take 0.5 tablets by mouth as needed. .5 to 1 tablet prn for muscle cramps    . diclofenac (VOLTAREN) 75 MG EC tablet Take 75 mg by mouth daily as needed. For muscle aches    . insulin degludec (TRESIBA FLEXTOUCH) 100 UNIT/ML SOPN FlexTouch Pen Inject 22 Units into the skin daily at 10 pm.    . Insulin Detemir (LEVEMIR FLEXPEN [place]) Inject 15 Units into the skin at bedtime.     . insulin lispro (HUMALOG) 100 UNIT/ML injection Inject into the skin 3 (three) times daily before meals.    . insulin regular (NOVOLIN R,HUMULIN R) 100 units/mL injection Inject into the skin 3 (three) times daily before meals.    . INVOKANA 100 MG TABS Take 100 mg by mouth daily before breakfast.     . ipratropium (ATROVENT) 0.02 % nebulizer solution Take 2.5 mLs (0.5 mg total) by nebulization 4 (four) times daily. 75 mL 12  . levothyroxine (SYNTHROID, LEVOTHROID) 200 MCG tablet Take 200 mcg by mouth daily.    . metFORMIN (GLUMETZA) 1000 MG (MOD) 24 hr tablet Take 1 tablet (1,000 mg total) by mouth 2 (two) times daily with a meal. HOLD for 48 hours, restart on 06/01/2014.    . metoprolol succinate (TOPROL-XL) 25 MG 24 hr tablet Take 1 tablet (25 mg total) by mouth daily. 90 tablet 3  . NOVOTWIST 32G X 5 MM MISC Use as directed    . omega-3 acid ethyl esters (LOVAZA) 1 g capsule Take 2 g by mouth 2 (two) times daily.    . ONE TOUCH ULTRA TEST test strip Use as directed    . pantoprazole (PROTONIX) 40 MG tablet Take 40 mg by mouth daily.    Marland Kitchen triamterene-hydrochlorothiazide (MAXZIDE-25) 37.5-25 MG per tablet Take 1 tablet by mouth daily.    . verapamil (CALAN-SR) 240 MG CR tablet Take 1 tablet by mouth daily.    Marland Kitchen albuterol (PROVENTIL HFA) 108 (90 BASE) MCG/ACT inhaler Inhale 2 puffs into the lungs every 6 (six) hours as needed  for wheezing or shortness of breath. For shortness of breath and wheezing 1 Inhaler prn   No current facility-administered medications for this visit.     Social History   Social History  . Marital status: Widowed    Spouse name: N/A  . Number of children: N/A  . Years of education: N/A   Occupational History  . retired    Social History Main Topics  . Smoking status: Former Smoker    Packs/day: 0.10    Years: 30.00    Quit date: 08/09/2003  . Smokeless tobacco: Never Used  . Alcohol use No  . Drug use: No  . Sexual activity: Not  on file   Other Topics Concern  . Not on file   Social History Narrative  . No narrative on file   Social she is widowed. She does try to be active. There is no tobacco use. She does drink occasional alcohol.  Family History  Problem Relation Age of Onset  . Hypertension Mother   . Heart disease Mother   . Prostate cancer Father   . Heart disease Brother   . Heart attack Sister   . Stroke Mother    ROS General: Negative; No fevers, chills, or night sweats;  HEENT: Negative; No changes in vision or hearing, sinus congestion, difficulty swallowing Pulmonary: Negative; No cough, wheezing, shortness of breath, hemoptysis Cardiovascular: Negative; No chest pain, presyncope, syncope, palpitations Positive for rare, intermittent leg swelling GI: Positive for GERD; No nausea, vomiting, diarrhea, or abdominal pain GU: Negative; No dysuria, hematuria, or difficulty voiding Musculoskeletal: Positive for arthritis in her knees and hips; no myalgias, joint pain, or weakness Hematologic/Oncology: Negative; no easy bruising, bleeding Endocrine: Positive for hypothyroidism, and type 2 diabetes mellitus; Neuro: Negative; no changes in balance, headaches Skin: Negative; No rashes or skin lesions Psychiatric: Negative; No behavioral problems, depression Sleep: Negative; No snoring, daytime sleepiness, hypersomnolence, bruxism, restless legs, hypnogognic  hallucinations, no cataplexy Other comprehensive 14 point system review is negative.   PE BP 120/76 (BP Location: Right Arm, Patient Position: Sitting, Cuff Size: Normal)   Pulse 74   Ht 5' 2"  (1.575 m)   Wt 178 lb 12.8 oz (81.1 kg)   SpO2 97%   BMI 32.70 kg/m    Repeat blood pressure by me was 124/78.  Wt Readings from Last 3 Encounters:  05/04/16 178 lb 12.8 oz (81.1 kg)  01/27/16 172 lb 9.6 oz (78.3 kg)  12/14/15 170 lb 9.6 oz (77.4 kg)   General: Alert, oriented, no distress.  Skin: normal turgor, no rashes HEENT: Normocephalic, atraumatic. Pupils round and reactive; sclera anicteric;no lid lag.  Nose without nasal septal hypertrophy Mouth/Parynx benign; Mallinpatti scale 2 Neck: No JVD, no carotid bruits with normal carotid upstroke Lungs: clear to ausculatation and percussion; no wheezing or rales Chest wall: Nontender to palpation Heart: RRR, s1 s2 normal 1/6 systolic murmur. Abdomen: soft, nontender; no hepatosplenomehaly, BS+; abdominal aorta nontender and not dilated by palpation. Back: no CVA tenderness Pulses 2+ Extremities: no clubbing cyanosis or edema, Homan's sign negative  Neurologic: grossly nonfocal Psychologic: normal affect and mood.  ECG (independently read by me): Normal sinus rhythm at 74 bpm with mild sinus arrhythmia.  Mild inferolateral T wave abnormalities.  September 2015 ECG (independently read by me): Normal sinus rhythm at 61 beats per minute.  QTc interval 4-6 Denise.  Nonspecific ST-T changes inferolaterally  December 2014 ECG: Normal sinus rhythm at 62 beats per minute. Nonspecific inferolateral T wave changes.  LABS: BMP Latest Ref Rng & Units 12/08/2015 09/23/2015 07/12/2015  Glucose 65 - 99 mg/dL 144(H) 211(H) 143(H)  BUN 6 - 20 mg/dL 24(H) 19 7  Creatinine 0.44 - 1.00 mg/dL 1.19(H) 1.08(H) 0.91  Sodium 135 - 145 mmol/L 137 137 138  Potassium 3.5 - 5.1 mmol/L 3.2(L) 4.2 2.9(L)  Chloride 101 - 111 mmol/L 104 99(L) 101  CO2 22 - 32 mmol/L  26 27 25   Calcium 8.9 - 10.3 mg/dL 8.3(L) 8.9 8.6(L)   Hepatic Function Latest Ref Rng & Units 09/23/2015 05/30/2014 09/02/2013  Total Protein 6.5 - 8.1 g/dL 8.4(H) 7.6 8.8(H)  Albumin 3.5 - 5.0 g/dL 3.8 3.3(L) 3.9  AST  15 - 41 U/L 17 17 17   ALT 14 - 54 U/L 16 12 10   Alk Phosphatase 38 - 126 U/L 80 72 79  Total Bilirubin 0.3 - 1.2 mg/dL 0.4 0.2(L) 0.3   CBC Latest Ref Rng & Units 12/08/2015 09/23/2015 09/14/2015  WBC 4.0 - 10.5 K/uL 9.0 9.7 10.6(H)  Hemoglobin 12.0 - 15.0 g/dL 10.9(L) 11.6(L) 12.6  Hematocrit 36.0 - 46.0 % 34.0(L) 36.7 40.5  Platelets 150 - 400 K/uL 243 323 308.0   Lab Results  Component Value Date   MCV 78.3 12/08/2015   MCV 78.9 09/23/2015   MCV 78.9 09/14/2015   No results found for: TSH   Lab Results  Component Value Date   HGBA1C 6.6 (H) 05/29/2014   Lipid Panel     Component Value Date/Time   CHOL 108 05/30/2014 0400   TRIG 155 (H) 05/30/2014 0400   HDL 40 05/30/2014 0400   CHOLHDL 2.7 05/30/2014 0400   VLDL 31 05/30/2014 0400   LDLCALC 37 05/30/2014 0400    RADIOLOGY: No results found.    ASSESSMENT AND PLAN: [name] is a 60 year old female with established coronary artery disease and is 10 years status post percutaneous coronary intervention to her proximal RCA. At that time, she had a 60 - 70% stenosis of her LAD as well as 20-30%. She has experienced recurrent episodes of probable musculoskeletal chest discomfort.  She underwent a nuclear stress test following my last visit, which was interpreted as high risk.  Cardiac catheterization did not reveal significant change in her coronary anatomy and her previously placed RCA stent was widely patent.  Her LAD lesion was not physiologically significant by fractional flow reserve.  Her ECG today shows sinus rhythm and she continues to have inferolateral T wave abnormalities.  Her blood pressure today is controlled.  She continues to be on aspirin and Plavix for dual antiplatelet therapy with her stent.   Her lipids are being treated with atorvastatin 80 mg with target LDL less than 70.  Her significant triglyceride elevation on blood work done earlier this year by Dr. Forde Dandy.  I have recommended lovaza 2 capsules twice a day. She is on insulin, invokana and Levemir flex pen for diabetes mellitus.  She continues to be on levothyroxine 20 g for hypothyroidism.  She has mild obesity with a BMI of 32.7.  Weight loss and increased exercise was recommended.  I have suggested she exercises at least 5 days per week for 30 minutes duration.  I will see her in one year for reevaluation.  Time spent: 25 minutes  Troy Sine, MD, [facility]  05/06/2016 5:11 PM

## 2016-05-13 ENCOUNTER — Other Ambulatory Visit: Payer: 59

## 2016-05-18 ENCOUNTER — Ambulatory Visit
Admission: RE | Admit: 2016-05-18 | Discharge: 2016-05-18 | Disposition: A | Discharge status: Home / Self Care | Payer: 59 | Source: Ambulatory Visit | Attending: Otolaryngology | Admitting: Otolaryngology

## 2016-05-18 DIAGNOSIS — J328 Other chronic sinusitis: Secondary | ICD-10-CM

## 2016-08-30 ENCOUNTER — Ambulatory Visit: Payer: 59 | Admitting: Internal Medicine

## 2016-09-21 ENCOUNTER — Other Ambulatory Visit: Payer: Self-pay | Admitting: Cardiovascular Disease

## 2016-09-21 MED ORDER — METOPROLOL SUCCINATE ER 25 MG PO TB24: 25.0000 mg | ORAL_TABLET | Freq: Every day | ORAL | 2 refills | Status: DC

## 2016-09-21 NOTE — Telephone Encounter (Signed)
Rx(s) sent to pharmacy electronically.  

## 2016-09-21 NOTE — Telephone Encounter (Signed)
New message     *STAT* If patient is at the pharmacy, call can be transferred to refill team.   1. Which medications need to be refilled? (please list name of each medication and dose if known) toprol 25 mg  2. Which pharmacy/location (including street and city if local pharmacy) is medication to be sent to? [facility] in [place]  3. Do they need a 30 day or 90 day supply? 90 day supply.

## 2016-09-28 DIAGNOSIS — E1142 Type 2 diabetes mellitus with diabetic polyneuropathy: Secondary | ICD-10-CM | POA: Diagnosis not present

## 2016-09-28 DIAGNOSIS — I10 Essential (primary) hypertension: Secondary | ICD-10-CM | POA: Diagnosis not present

## 2016-09-28 DIAGNOSIS — R1013 Epigastric pain: Secondary | ICD-10-CM | POA: Diagnosis not present

## 2016-09-28 DIAGNOSIS — E119 Type 2 diabetes mellitus without complications: Secondary | ICD-10-CM | POA: Diagnosis not present

## 2016-10-03 ENCOUNTER — Other Ambulatory Visit: Payer: Self-pay | Admitting: Physician Assistant

## 2016-10-03 NOTE — Telephone Encounter (Signed)
New Message'    *STAT* If patient is at the pharmacy, call can be transferred to refill team.   1. Which medications need to be refilled? (please list name of each medication and dose if known) Isosorbide mononitrate (Imdur)  2. Which pharmacy/location (including street and city if local pharmacy) is medication to be sent to? [facility] in [place]  3. Do they need a 30 day or 90 day supply? 90 day supply

## 2016-10-05 DIAGNOSIS — J029 Acute pharyngitis, unspecified: Secondary | ICD-10-CM | POA: Diagnosis not present

## 2016-10-05 DIAGNOSIS — J020 Streptococcal pharyngitis: Secondary | ICD-10-CM | POA: Diagnosis not present

## 2016-10-05 DIAGNOSIS — J309 Allergic rhinitis, unspecified: Secondary | ICD-10-CM | POA: Diagnosis not present

## 2016-10-08 ENCOUNTER — Other Ambulatory Visit: Payer: Self-pay | Admitting: Physician Assistant

## 2016-10-10 ENCOUNTER — Other Ambulatory Visit: Payer: Self-pay | Admitting: Cardiovascular Disease

## 2016-10-10 ENCOUNTER — Other Ambulatory Visit: Payer: Self-pay | Admitting: Physician Assistant

## 2016-10-11 NOTE — Telephone Encounter (Signed)
Rx(s) sent to pharmacy electronically.  

## 2016-10-22 ENCOUNTER — Other Ambulatory Visit: Payer: Self-pay | Admitting: Internal Medicine

## 2016-10-28 DIAGNOSIS — J45909 Unspecified asthma, uncomplicated: Secondary | ICD-10-CM | POA: Diagnosis not present

## 2016-10-28 DIAGNOSIS — E1151 Type 2 diabetes mellitus with diabetic peripheral angiopathy without gangrene: Secondary | ICD-10-CM | POA: Diagnosis not present

## 2016-10-28 DIAGNOSIS — J019 Acute sinusitis, unspecified: Secondary | ICD-10-CM | POA: Diagnosis not present

## 2016-10-28 DIAGNOSIS — J309 Allergic rhinitis, unspecified: Secondary | ICD-10-CM | POA: Diagnosis not present

## 2016-11-28 ENCOUNTER — Emergency Department (HOSPITAL_BASED_OUTPATIENT_CLINIC_OR_DEPARTMENT_OTHER)
Admission: EM | Admit: 2016-11-28 | Discharge: 2016-11-28 | Disposition: A | Discharge status: Home / Self Care | Payer: 59 | Attending: Emergency Medicine | Admitting: Emergency Medicine

## 2016-11-28 ENCOUNTER — Emergency Department (HOSPITAL_BASED_OUTPATIENT_CLINIC_OR_DEPARTMENT_OTHER): Payer: 59

## 2016-11-28 ENCOUNTER — Encounter (HOSPITAL_BASED_OUTPATIENT_CLINIC_OR_DEPARTMENT_OTHER): Payer: Self-pay | Admitting: *Deleted

## 2016-11-28 DIAGNOSIS — Z79899 Other long term (current) drug therapy: Secondary | ICD-10-CM | POA: Diagnosis not present

## 2016-11-28 DIAGNOSIS — R0602 Shortness of breath: Secondary | ICD-10-CM | POA: Diagnosis present

## 2016-11-28 DIAGNOSIS — R600 Localized edema: Secondary | ICD-10-CM | POA: Diagnosis not present

## 2016-11-28 DIAGNOSIS — E039 Hypothyroidism, unspecified: Secondary | ICD-10-CM | POA: Insufficient documentation

## 2016-11-28 DIAGNOSIS — R05 Cough: Secondary | ICD-10-CM

## 2016-11-28 DIAGNOSIS — J45901 Unspecified asthma with (acute) exacerbation: Secondary | ICD-10-CM | POA: Diagnosis not present

## 2016-11-28 DIAGNOSIS — R059 Cough, unspecified: Secondary | ICD-10-CM

## 2016-11-28 DIAGNOSIS — Z7982 Long term (current) use of aspirin: Secondary | ICD-10-CM | POA: Insufficient documentation

## 2016-11-28 DIAGNOSIS — Z87891 Personal history of nicotine dependence: Secondary | ICD-10-CM | POA: Diagnosis not present

## 2016-11-28 DIAGNOSIS — I2510 Atherosclerotic heart disease of native coronary artery without angina pectoris: Secondary | ICD-10-CM | POA: Diagnosis not present

## 2016-11-28 DIAGNOSIS — Z794 Long term (current) use of insulin: Secondary | ICD-10-CM | POA: Insufficient documentation

## 2016-11-28 DIAGNOSIS — E119 Type 2 diabetes mellitus without complications: Secondary | ICD-10-CM | POA: Insufficient documentation

## 2016-11-28 DIAGNOSIS — I10 Essential (primary) hypertension: Secondary | ICD-10-CM | POA: Diagnosis not present

## 2016-11-28 LAB — COMPREHENSIVE METABOLIC PANEL
ALT: 15 U/L (ref 14–54)
ANION GAP: 10 (ref 5–15)
AST: 19 U/L (ref 15–41)
Albumin: 3.5 g/dL (ref 3.5–5.0)
Alkaline Phosphatase: 75 U/L (ref 38–126)
BUN: 17 mg/dL (ref 6–20)
CALCIUM: 8.8 mg/dL — AB (ref 8.9–10.3)
CHLORIDE: 108 mmol/L (ref 101–111)
CO2: 20 mmol/L — AB (ref 22–32)
CREATININE: 1.13 mg/dL — AB (ref 0.44–1.00)
GFR, EST AFRICAN AMERICAN: 60 mL/min — AB (ref >60)
GFR, EST NON AFRICAN AMERICAN: 51 mL/min — AB (ref >60)
Glucose, Bld: 78 mg/dL (ref 65–99)
Potassium: 4.3 mmol/L (ref 3.5–5.1)
SODIUM: 138 mmol/L (ref 135–145)
Total Bilirubin: 0.1 mg/dL — ABNORMAL LOW (ref 0.3–1.2)
Total Protein: 7.9 g/dL (ref 6.5–8.1)

## 2016-11-28 LAB — PROTIME-INR
INR: 0.96
PROTHROMBIN TIME: 12.8 s (ref 11.4–15.2)

## 2016-11-28 LAB — CBC WITH DIFFERENTIAL/PLATELET
BASOS ABS: 0.1 10*3/uL (ref 0.0–0.1)
BASOS PCT: 1 %
EOS ABS: 0.5 10*3/uL (ref 0.0–0.7)
Eosinophils Relative: 6 %
HEMATOCRIT: 29.8 % — AB (ref 36.0–46.0)
HEMOGLOBIN: 9.3 g/dL — AB (ref 12.0–15.0)
Lymphocytes Relative: 42 %
Lymphs Abs: 3.6 10*3/uL (ref 0.7–4.0)
MCH: 24.7 pg — ABNORMAL LOW (ref 26.0–34.0)
MCHC: 31.2 g/dL (ref 30.0–36.0)
MCV: 79 fL (ref 78.0–100.0)
MONOS PCT: 9 %
Monocytes Absolute: 0.8 10*3/uL (ref 0.1–1.0)
NEUTROS ABS: 3.6 10*3/uL (ref 1.7–7.7)
NEUTROS PCT: 42 %
Platelets: 276 10*3/uL (ref 150–400)
RBC: 3.77 MIL/uL — AB (ref 3.87–5.11)
RDW: 16.1 % — ABNORMAL HIGH (ref 11.5–15.5)
WBC: 8.6 10*3/uL (ref 4.0–10.5)

## 2016-11-28 LAB — BRAIN NATRIURETIC PEPTIDE: B NATRIURETIC PEPTIDE 5: 59.9 pg/mL (ref 0.0–100.0)

## 2016-11-28 LAB — TROPONIN I

## 2016-11-28 LAB — D-DIMER, QUANTITATIVE (NOT AT ARMC): D DIMER QUANT: 0.54 ug{FEU}/mL — AB (ref 0.00–0.50)

## 2016-11-28 MED ORDER — IPRATROPIUM-ALBUTEROL 0.5-2.5 (3) MG/3ML IN SOLN
RESPIRATORY_TRACT | Status: AC
  Filled 2016-11-28: qty 3

## 2016-11-28 MED ORDER — IPRATROPIUM-ALBUTEROL 0.5-2.5 (3) MG/3ML IN SOLN
3.0000 mL | Freq: Once | RESPIRATORY_TRACT | Status: AC
  Administered 2016-11-28: 3 mL via RESPIRATORY_TRACT

## 2016-11-28 NOTE — ED Notes (Addendum)
Went to obtain EKG since patient is complaining of chest burning, patient refusing EKG at this time wants to wait until EDP sees them due to patient already had a EKG at [place] today and is worried about Insurance not paying for another EKG, Advised patient we could wait but EDP may still want one in once she is in back, RN [employer] notified.

## 2016-11-28 NOTE — Discharge Instructions (Signed)
These continue taking your breathing treatments at home for further management of your asthma. We suspect you have a viral infection on top of your reactive airway disease. We had our shared decision-making conversation about not doing a PE study at this time however, this may be something to check if you have worsened symptoms. You may also have an allergic reactions to the pollen. If any symptoms change or worsen, please return to the nearest emergency department.

## 2016-11-28 NOTE — ED Notes (Signed)
Patient angry because MD would not give her albuterol and atrovent to take home, tried explaining to patient that the MD is tied up in an emergency.  Offered to prescribed albuterol inhaler but patient angry because it could not be completed at this time right now and left. MD notified.

## 2016-11-28 NOTE — ED Provider Notes (Signed)
[facility] Provider Note   CSN: [id number] Arrival date & time: 11/28/16  1818   By signing my name below, I, [name], attest that this documentation has been prepared under the direction and in the presence of Denise Paris, MD . Electronically Signed: Neta [name], ED Scribe. 11/28/2016. 9:21 PM.   History   Chief Complaint Chief Complaint  Patient presents with  . Shortness of Breath    The history is provided by the patient. No language interpreter was used.  Shortness of Breath  This is a recurrent problem. The average episode lasts 1 month. The problem occurs intermittently.The current episode started more than 2 days ago. The problem has been gradually improving. Associated symptoms include rhinorrhea, cough, wheezing and leg swelling. Pertinent negatives include no fever, no headaches, no neck pain, no chest pain, no syncope, no vomiting, no abdominal pain, no rash, no leg pain and no claudication. She has tried beta-agonist inhalers for the symptoms. The treatment provided mild relief. Associated medical issues include CAD.   HPI Comments:  [name] is a 61 y.o. female with PMHx of bronchitis and DM who presents to the Emergency Department complaining of intermittent SOB x 1 month. Pt complains of associated central chest tightness, dry cough, nausea, and wheezing. She also reports bilateral leg swelling and notes that she had malodorous urine last week. She describes the chest tightness as "burning" that is made worse with coughing. Pt was seen by here PCP today and has a normal EKG. Pt had a coronary stent placed in 2007. Denies any hx of DVT/PE. Pt takes Plavix regularly. She has been using an inhaler at home regularly. Pt had a breathing treatment at the ED with moderate relief. Denies fever, chills, vomiting.    Past Medical History:  Diagnosis Date  . Acute bronchitis   . Anemia    hx of anemia  . Anginal pain ([place])   .  Arthritis    osteo  . Coronary atherosclerosis   . Cough   . Hypertension   . Obstructive sleep apnea (adult) (pediatric)   . Type II or unspecified type diabetes mellitus without mention of complication, not stated as uncontrolled    type 2  . Unspecified hypothyroidism     Patient Active Problem List   Diagnosis Date Noted  . Acute bronchitis 08/10/2014  . Unstable angina ([place]) 05/29/2014  . Abnormal nuclear stress test 05/29/2014  . Chest pain 05/06/2014  . Exertional shortness of breath 05/06/2014  . Mixed hyperlipidemia 05/06/2014  . Hyperlipidemia with target LDL less than 70 08/05/2013  . HTN (hypertension) 08/05/2013  . Chest wall pain 08/05/2013  . SINUSITIS, ACUTE 04/07/2009  . HYPOTHYROIDISM 12/10/2007  . DIABETES MELLITUS, TYPE II 05/28/2007  . Obstructive sleep apnea 05/28/2007  . Coronary atherosclerosis 05/28/2007  . Seasonal and perennial allergic rhinitis 05/28/2007  . COUGH, CHRONIC 05/28/2007    Past Surgical History:  Procedure Laterality Date  . ANTERIOR FUSION CERVICAL SPINE    . LEFT HEART CATHETERIZATION WITH CORONARY ANGIOGRAM N/A 05/29/2014   Procedure: LEFT HEART CATHETERIZATION WITH CORONARY ANGIOGRAM;  Surgeon: Sinclair Grooms, MD;  Location: [facility] CATH LAB;  Service: Cardiovascular;  Laterality: N/A;  . TUBAL LIGATION    . WISDOM TOOTH EXTRACTION      OB History    No data available       Home Medications    Prior to Admission medications   Medication Sig Start Date End Date Taking?  Authorizing Provider  Insulin Glargine (BASAGLAR KWIKPEN) 100 UNIT/ML SOPN Inject into the skin at bedtime.   Yes Historical Provider, MD  ADVAIR DISKUS 250-50 MCG/DOSE AEPB INHALE ONE (1) PUFF(S) TWICE DAILY, RINSE MOUTH. 10/24/16   Deneise Lever, MD  albuterol (PROVENTIL HFA) 108 (90 BASE) MCG/ACT inhaler Inhale 2 puffs into the lungs every 6 (six) hours as needed for wheezing or shortness of breath. For shortness of breath and wheezing 11/08/11 01/27/16   Deneise Lever, MD  albuterol (PROVENTIL) (2.5 MG/3ML) 0.083% nebulizer solution Take 3 mLs (2.5 mg total) by nebulization every 6 (six) hours as needed for wheezing or shortness of breath. 06/30/15 02/07/18  Deneise Lever, MD  aspirin 81 MG chewable tablet Chew 1 tablet (81 mg total) by mouth daily. 05/30/14   Evelene Croon Barrett, PA-C  atorvastatin (LIPITOR) 80 MG tablet Take 1 tablet Monday, Wednesday, and Friday 09/11/15   Eileen Stanford, PA-C  Calcium Carbonate Antacid (ALKA-SELTZER ANTACID PO) Take by mouth daily as needed.    Historical Provider, MD  clopidogrel (PLAVIX) 75 MG tablet TAKE ONE TABLET BY MOUTH DAILY 10/10/16   Troy Sine, MD  diazepam (VALIUM) 5 MG tablet Take 0.5 tablets by mouth as needed. .5 to 1 tablet prn for muscle cramps 10/28/11   Historical Provider, MD  diclofenac (VOLTAREN) 75 MG EC tablet Take 75 mg by mouth daily as needed. For muscle aches    Historical Provider, MD  insulin degludec (TRESIBA FLEXTOUCH) 100 UNIT/ML SOPN FlexTouch Pen Inject 22 Units into the skin daily at 10 pm.    Historical Provider, MD  Insulin Detemir (LEVEMIR FLEXPEN ) Inject 15 Units into the skin at bedtime.     Historical Provider, MD  insulin lispro (HUMALOG) 100 UNIT/ML injection Inject into the skin 3 (three) times daily before meals.    Historical Provider, MD  insulin regular (NOVOLIN R,HUMULIN R) 100 units/mL injection Inject into the skin 3 (three) times daily before meals.    Historical Provider, MD  INVOKANA 100 MG TABS Take 100 mg by mouth daily before breakfast.  07/03/13   Historical Provider, MD  ipratropium (ATROVENT) 0.02 % nebulizer solution Take 2.5 mLs (0.5 mg total) by nebulization 4 (four) times daily. 07/12/15   Charlesetta Shanks, MD  isosorbide mononitrate (IMDUR) 120 MG 24 hr tablet TAKE ONE TABLET BY MOUTH DAILY 10/03/16   Troy Sine, MD  levothyroxine (SYNTHROID, LEVOTHROID) 200 MCG tablet Take 200 mcg by mouth daily.    Historical Provider, MD  metFORMIN  (GLUMETZA) 1000 MG (MOD) 24 hr tablet Take 1 tablet (1,000 mg total) by mouth 2 (two) times daily with a meal. HOLD for 48 hours, restart on 06/01/2014. 05/30/14   Evelene Croon Barrett, PA-C  metoprolol succinate (TOPROL-XL) 25 MG 24 hr tablet Take 1 tablet (25 mg total) by mouth daily. 09/21/16   Troy Sine, MD  NOVOTWIST 32G X 5 MM MISC Use as directed 05/04/11   Historical Provider, MD  omega-3 acid ethyl esters (LOVAZA) 1 g capsule TAKE TWO CAPSULES BY MOUTH TWO TIMES A DAY 10/11/16   Troy Sine, MD  ONE TOUCH ULTRA TEST test strip Use as directed 05/05/11   Historical Provider, MD  pantoprazole (PROTONIX) 40 MG tablet Take 40 mg by mouth daily.    Historical Provider, MD  Potassium Chloride ER 20 MEQ TBCR TAKE TWO TABLETS BY MOUTH ONCE DAILY 10/11/16   Troy Sine, MD  triamterene-hydrochlorothiazide (MAXZIDE-25) 37.5-25 MG per tablet Take 1  tablet by mouth daily. 05/29/14   Historical Provider, MD  verapamil (CALAN-SR) 240 MG CR tablet Take 1 tablet by mouth daily. 05/26/14   Historical Provider, MD    Family History Family History  Problem Relation Age of Onset  . Hypertension Mother   . Heart disease Mother   . Stroke Mother   . Prostate cancer Father   . Heart disease Brother   . Heart attack Sister     Social History Social History  Substance Use Topics  . Smoking status: Former Smoker    Packs/day: 0.10    Years: 30.00    Quit date: 08/09/2003  . Smokeless tobacco: Never Used  . Alcohol use No     Allergies   Codeine; Levaquin [levofloxacin]; Sulfa antibiotics; and Sulfonamide derivatives   Review of Systems Review of Systems  Constitutional: Negative for chills, diaphoresis, fatigue and fever.  HENT: Positive for congestion and rhinorrhea.   Eyes: Negative for visual disturbance.  Respiratory: Positive for cough, chest tightness, shortness of breath and wheezing. Negative for choking and stridor.   Cardiovascular: Positive for leg swelling. Negative for chest  pain, palpitations, claudication and syncope.  Gastrointestinal: Positive for nausea. Negative for abdominal pain and vomiting.  Genitourinary: Negative for dysuria and urgency.       +malodorous urine  Musculoskeletal: Negative for back pain and neck pain.  Skin: Negative for rash and wound.  Neurological: Negative for weakness, light-headedness and headaches.  Psychiatric/Behavioral: Negative for agitation.  All other systems reviewed and are negative.    Physical Exam Updated Vital Signs BP 138/77 (BP Location: Right Arm)   Pulse 68   Temp 99 F (37.2 C) (Oral)   Resp 18   Ht 5\' 2"  (1.575 m)   Wt 184 lb (83.5 kg)   SpO2 100%   BMI 33.65 kg/m   Physical Exam  Constitutional: She appears well-developed and well-nourished. No distress.  HENT:  Head: Normocephalic and atraumatic.  Mouth/Throat: Oropharynx is clear and moist. No oropharyngeal exudate.  Eyes: Conjunctivae and EOM are normal. Pupils are equal, round, and reactive to light.  Neck: Neck supple.  Cardiovascular: Normal rate and regular rhythm.   No murmur heard. Pulmonary/Chest: Effort normal. No respiratory distress. She has wheezes (all fields). She has no rales. She exhibits tenderness (reproduced the burning pain).  Abdominal: Soft. There is no tenderness.  Musculoskeletal: She exhibits edema (2+ in bilateral lower extremities, nontender). She exhibits no tenderness.  Neurological: She is alert. No sensory deficit. She exhibits normal muscle tone.  Skin: Skin is warm and dry. Capillary refill takes less than 2 seconds. No rash noted. No erythema.  Psychiatric: She has a normal mood and affect.  Nursing note and vitals reviewed.    ED Treatments / Results  DIAGNOSTIC STUDIES:  Oxygen Saturation is 100% on RA, normal by my interpretation.    COORDINATION OF CARE:  9:06 PM Breathing treatment provided. Discussed treatment plan with pt at bedside and pt agreed to plan.   Labs (all labs ordered are  listed, but only abnormal results are displayed) Labs Reviewed  CBC WITH DIFFERENTIAL/PLATELET - Abnormal; Notable for the following:       Result Value   RBC 3.77 (*)    Hemoglobin 9.3 (*)    HCT 29.8 (*)    MCH 24.7 (*)    RDW 16.1 (*)    All other components within normal limits  D-DIMER, QUANTITATIVE (NOT AT [facility]) - Abnormal; Notable for the following:  D-Dimer, Quant 0.54 (*)    All other components within normal limits  COMPREHENSIVE METABOLIC PANEL - Abnormal; Notable for the following:    CO2 20 (*)    Creatinine, Ser 1.13 (*)    Calcium 8.8 (*)    Total Bilirubin 0.1 (*)    GFR calc non Af Amer 51 (*)    GFR calc Af Amer 60 (*)    All other components within normal limits  BRAIN NATRIURETIC PEPTIDE  TROPONIN I  PROTIME-INR    EKG  EKG Interpretation  Date/Time:  Monday November 28 2016 21:32:10 EDT Ventricular Rate:  56 PR Interval:    QRS Duration: 98 QT Interval:  488 QTC Calculation: 471 R Axis:   61 Text Interpretation:  Sinus rhythm Borderline T abnormalities, anterior leads Confirmed by Alvino Chapel  MD, Ovid Curd [phone]) on 11/29/2016 7:45:50 PM       Radiology Dg Chest 2 View  Result Date: 11/28/2016 CLINICAL DATA:  61 year old female with shortness of breath and back pain. EXAM: CHEST  2 VIEW COMPARISON:  Chest radiograph 12/08/2015 FINDINGS: The lungs are clear. There is no pleural effusion or pneumothorax the cardiac silhouette is within normal limits the cervical fusion plate and noted. No acute osseous pathology. IMPRESSION: No active cardiopulmonary disease. Electronically Signed   By: Anner Crete M.D.   On: 11/28/2016 21:07    Procedures Procedures (including critical care time)  Medications Ordered in ED Medications  ipratropium-albuterol (DUONEB) 0.5-2.5 (3) MG/3ML nebulizer solution 3 mL (3 mLs Nebulization Given 11/28/16 1833)     Initial Impression / Assessment and Plan / ED Course  I have reviewed the triage vital signs and the  nursing notes.  Pertinent labs & imaging results that were available during my care of the patient were reviewed by me and considered in my medical decision making (see chart for details).     [name] is a 61 y.o. female With a past medical history significant for CAD with PCI, diabetes, hypertension, chronic anemia, bronchitis, reactive airway disease, and seasonal allergies who presents with intermittent shortness of breath. Patient reports that she has been having shortness of breath spells and wheezing that has not been fully alleviated by her home albuterol inhaler. She denies chest pain. She denies fevers or chills but does have many sick contacts with upper [place] infections. Patient also reports the pollen makes her breathing worse.  On exam, patient had wheezing in all in fields. Patient received a DuoNeb breathing treatment with significant improvement in symptoms. Abdomen and back were nontender. Chest mildly tender. Patient had mild lower extremity edema which she reports is unchanged from normal.  Initial EKG appeared unchanged from prior. Chest x-ray shows no pneumonia or pulmonary edema. Laboratory testing showed no leukocytosis and mild anemia. Patient reports that she is usually anemic.  D dimer was performed given the tachycardia and shortness of breath on arrival. D dimer return slightly positive at 0.54. Shared decision-making conversation was held with patient and both patient and family decided not to obtain a PT PE study today. Since patient was feeling much better, she feels comfort following up with a PCP and strict return precautions for any return symptoms. I do not feel patient has a pulmonary embolism at this time however, if symptoms return, that would be next test to order.   Patient was observed for a period of time with continued improvement in her breathing. Given patient's history of diabetes, she also did not want to get steroid course  at this time.  Patient understands plan to follow up with PCP.  After discharge information was printed, this examiner was taking care of an airway emergency at another part of the emergency department. According to nursing report, PA report, and documentation, patient began getting frustrated that she cannot receive prescription for a DuoNeb at home. Patient was told by staff that she would be given either another treatment at this time or a prescription for albuterol however, patient refused and left the emergency department. Prior to this episode, patient understood return precautions and plan of care and patient appear well. Patient discharged in good condition.      Final Clinical Impressions(s) / ED Diagnoses   Final diagnoses:  Shortness of breath  Mild asthma with exacerbation, unspecified whether persistent  Cough    New Prescriptions Discharge Medication List as of 11/28/2016 11:44 PM     Clinical Impression: 1. Shortness of breath   2. Mild asthma with exacerbation, unspecified whether persistent   3. Cough     Disposition: Discharge  Condition: Good  I have discussed the results, Dx and Tx plan with the pt(& family if present). He/she/they expressed understanding and agree(s) with the plan. Discharge instructions discussed at great length. Strict return precautions discussed and pt &/or family have verbalized understanding of the instructions. No further questions at time of discharge.    Discharge Medication List as of 11/28/2016 11:44 PM      Follow Up: Reynold Bowen, MD [place] [address] [phone]  Schedule an appointment as soon as possible for a visit    [place] [address] [id number] [place] [phone]  If symptoms worsen      Denise Paris, MD 11/30/16 1024

## 2016-11-28 NOTE — ED Triage Notes (Signed)
Sob on and off for over a month. Burning in the center of her chest for over a month. She was seen by her MD today and had a normal EKG. She left before treatment was complete.

## 2016-11-29 MED ORDER — IOPAMIDOL (ISOVUE-370) INJECTION 76%: 100.0000 mL | Freq: Once | INTRAVENOUS | Status: DC | PRN

## 2016-11-29 NOTE — ED Provider Notes (Signed)
Patient was being discharged and she was under the impression that she was getting a DuoNeb packet to go home with the place in her albuterol nebulizer at home. I was assisting Dr. Sherry Ruffing to help expedite discharge. Patient was very angry about wait time in the room and was very vocal with this provider. States that she has told multiple people that she wanted a DuoNeb packet to go home with. Informed patient that he would not do this. I offered her a DuoNeb in the ED, an albuterol inhaler, nebulizer solution prescription but did not give her a packet. Asked patient to not yell at this provider. Dr.  Sherry Ruffing instruct me that he was going to give her an albuterol inhaler to go home with. Patient states she has a home and she does not need another one. Went back to inform patient she had already left because she was very angry.    Doristine Devoid, PA-C 11/29/16 0009    Gwenyth Allegra Tegeler, MD 11/30/16 1012

## 2016-12-01 DIAGNOSIS — J410 Simple chronic bronchitis: Secondary | ICD-10-CM | POA: Diagnosis not present

## 2016-12-19 ENCOUNTER — Ambulatory Visit (INDEPENDENT_AMBULATORY_CARE_PROVIDER_SITE_OTHER): Payer: 59 | Admitting: Cardiovascular Disease

## 2016-12-19 ENCOUNTER — Encounter: Payer: Self-pay | Admitting: Cardiovascular Disease

## 2016-12-19 VITALS — BP 140/70 | HR 68 | Ht 62.0 in | Wt 182.0 lb

## 2016-12-19 DIAGNOSIS — I2510 Atherosclerotic heart disease of native coronary artery without angina pectoris: Secondary | ICD-10-CM | POA: Diagnosis not present

## 2016-12-19 DIAGNOSIS — E119 Type 2 diabetes mellitus without complications: Secondary | ICD-10-CM | POA: Diagnosis not present

## 2016-12-19 DIAGNOSIS — Z794 Long term (current) use of insulin: Secondary | ICD-10-CM

## 2016-12-19 DIAGNOSIS — I10 Essential (primary) hypertension: Secondary | ICD-10-CM | POA: Diagnosis not present

## 2016-12-19 DIAGNOSIS — J4520 Mild intermittent asthma, uncomplicated: Secondary | ICD-10-CM

## 2016-12-19 DIAGNOSIS — E785 Hyperlipidemia, unspecified: Secondary | ICD-10-CM

## 2016-12-19 NOTE — Patient Instructions (Signed)
Medication Instructions: No changes   Follow-Up: Your physician wants you to follow-up in: one year with Dr. Claiborne Billings. You will receive a reminder letter in the mail two months in advance. If you don't receive a letter, please call our office to schedule the follow-up appointment.    If you need a refill on your cardiac medications before your next appointment, please call your pharmacy.

## 2016-12-19 NOTE — Progress Notes (Signed)
Patient ID: [name], female   DOB: [birth year], 61 y.o.   MRN: [id number]      PCP: Dr. Forde Dandy  HPI: [name] is a 61 y.o. female who presents to the office for a 8 month cardiology evaluation.   Ms Rodak has known coronary artery disease and in 2007 underwent insertion of a 3.0x13 mm Cypher DES stent to her proximal RCA. She did have concomitant CAD with 60-70% stenosis of her LAD and 20% circumflex stenosis. In July 2010 she develop recurrent chest pain and repeat catheterization showed 60% LAD stenosis, 20-30% circumflex stenosis and a widely patent RCA stent.  She has a history of hypertension, hyperlipidemia, hypothyroidism, diabetes mellitus, asthma, GERD, as well as intermittent leg swelling.  When I saw her in December, 2014, she had experienced some episodes of chest pain, which sounded musculoskeletal.  However, with her cardiac risk factor profile, and known CAD .  I recommended a nuclear stress test, but she did not have that done.  I last saw her in September 2015 and ultimately recommended that she undergo the nuclear stress study.  This was interpreted as a high risk study revealing a large moderate intensity reversible distal anterior apical and inferior defect.  As result, she underwent cardiac catheterization in October 2015.  Her catheterization findings were essentially unchanged from previously and showed a widely patent RCA stent.  There was segmental 50% proximal to mid LAD stenoses with an FFR of 0.94.  There were irregularities in the circumflex.  She had normal LV function.  She sees Dr. Forde Dandy and had laboratory in January 2017.  This was notable in that her cholesterol was 191, triglycerides 267, HDL 49, and LDL 89.  Gross was elevated at 190.  Her TSH was 0.11 and free T4 1 0.5.  Subsequent blood work in May 2017 showed a BUN of 24, creatinine 1.19.  Hemoglobin 10.9, hematocrit 34.0.  On 01/06/2016 she underwent an echo Doppler study which showed an  ejection fraction of 60-65% with grade 1 diastolic dysfunction.  There was trivial AR.  When I last saw her, she denied  any episodes of exertional chest tightness, but had rare episodes of chest wall discomfort which improves with aspirin.  She had occasional swelling of her ankles.  She also has a history of intermittent wheezing for which he takes Advair.  Laboratory by Dr. Forde Dandy had revealed significant triglyceride elevation and I recommended lovasa at 2 capsules twice a day.    Since I last saw her, she experienced occasional episodes of wheezing in the winter months.  She has sleep apnea and is on CPAP.  She denies any episodes of chest pressure.  She is unaware of palpitations.  She went to the emergency room on 11/28/2016 with shortness of breath.  She was given a breathing treatment in the emergency room with moderate relief of her symptoms.  She was felt to have mild asthma with exacerbation.  Laboratory several weeks ago showed stable renal function with a creatinine 1.13.  She was anemic and is being followed by Dr. Forde Dandy with her hemoglobin of 9.3, hematocrit of 29.8.  BNP was normal at 59.9.   Presently, she states she is breathing well.  She is not having chest pain or shortness of breath.  She presents for evaluation.   Past Medical History:  Diagnosis Date  . Acute bronchitis   . Anemia    hx of anemia  . Anginal pain ([place])   . Arthritis  osteo  . Coronary atherosclerosis   . Cough   . Hypertension   . Obstructive sleep apnea (adult) (pediatric)   . Type II or unspecified type diabetes mellitus without mention of complication, not stated as uncontrolled    type 2  . Unspecified hypothyroidism     Past Surgical History:  Procedure Laterality Date  . ANTERIOR FUSION CERVICAL SPINE    . LEFT HEART CATHETERIZATION WITH CORONARY ANGIOGRAM N/A 05/29/2014   Procedure: LEFT HEART CATHETERIZATION WITH CORONARY ANGIOGRAM;  Surgeon: Sinclair Grooms, MD;  Location: [facility] CATH LAB;   Service: Cardiovascular;  Laterality: N/A;  . TUBAL LIGATION    . WISDOM TOOTH EXTRACTION      Allergies  Allergen Reactions  . Codeine Other (See Comments)    REACTION: hallucinations/"loopy"  . Levaquin [Levofloxacin] Nausea And Vomiting  . Sulfa Antibiotics Hives  . Sulfonamide Derivatives Hives    Current Outpatient Prescriptions  Medication Sig Dispense Refill  . ADVAIR DISKUS 250-50 MCG/DOSE AEPB INHALE ONE (1) PUFF(S) TWICE DAILY, RINSE MOUTH. 60 each 4  . albuterol (PROVENTIL) (2.5 MG/3ML) 0.083% nebulizer solution Take 3 mLs (2.5 mg total) by nebulization every 6 (six) hours as needed for wheezing or shortness of breath. 75 mL 5  . aspirin 81 MG chewable tablet Chew 1 tablet (81 mg total) by mouth daily.    Marland Kitchen atorvastatin (LIPITOR) 80 MG tablet Take 1 tablet Monday, Wednesday, and Friday 36 tablet 3  . Calcium Carbonate Antacid (ALKA-SELTZER ANTACID PO) Take by mouth daily as needed.    . clopidogrel (PLAVIX) 75 MG tablet TAKE ONE TABLET BY MOUTH DAILY 30 tablet 6  . diazepam (VALIUM) 5 MG tablet Take 0.5 tablets by mouth as needed. .5 to 1 tablet prn for muscle cramps    . diclofenac (VOLTAREN) 75 MG EC tablet Take 75 mg by mouth daily as needed. For muscle aches    . Insulin Detemir (LEVEMIR FLEXPEN [place]) Inject 15 Units into the skin at bedtime.     . insulin lispro (HUMALOG) 100 UNIT/ML injection Inject into the skin 3 (three) times daily before meals.    Marland Kitchen ipratropium (ATROVENT) 0.02 % nebulizer solution Take 2.5 mLs (0.5 mg total) by nebulization 4 (four) times daily. 75 mL 12  . isosorbide mononitrate (IMDUR) 120 MG 24 hr tablet TAKE ONE TABLET BY MOUTH DAILY 30 tablet 6  . levothyroxine (SYNTHROID, LEVOTHROID) 200 MCG tablet Take 200 mcg by mouth daily.    . metFORMIN (GLUMETZA) 1000 MG (MOD) 24 hr tablet Take 1 tablet (1,000 mg total) by mouth 2 (two) times daily with a meal. HOLD for 48 hours, restart on 06/01/2014.    . metoprolol succinate (TOPROL-XL) 25 MG 24 hr  tablet Take 1 tablet (25 mg total) by mouth daily. 90 tablet 2  . NOVOTWIST 32G X 5 MM MISC Use as directed    . omega-3 acid ethyl esters (LOVAZA) 1 g capsule TAKE TWO CAPSULES BY MOUTH TWO TIMES A DAY 120 capsule 2  . ONE TOUCH ULTRA TEST test strip Use as directed    . pantoprazole (PROTONIX) 40 MG tablet Take 40 mg by mouth daily.    . Potassium Chloride ER 20 MEQ TBCR TAKE TWO TABLETS BY MOUTH ONCE DAILY 60 tablet 2  . triamterene-hydrochlorothiazide (MAXZIDE-25) 37.5-25 MG per tablet Take 1 tablet by mouth daily.    . verapamil (CALAN-SR) 240 MG CR tablet Take 1 tablet by mouth daily.    Marland Kitchen albuterol (PROVENTIL HFA) 108 (90 BASE)  MCG/ACT inhaler Inhale 2 puffs into the lungs every 6 (six) hours as needed for wheezing or shortness of breath. For shortness of breath and wheezing 1 Inhaler prn   No current facility-administered medications for this visit.     Social History   Social History  . Marital status: Widowed    Spouse name: N/A  . Number of children: N/A  . Years of education: N/A   Occupational History  . retired    Social History Main Topics  . Smoking status: Former Smoker    Packs/day: 0.10    Years: 30.00    Quit date: 08/09/2003  . Smokeless tobacco: Never Used  . Alcohol use No  . Drug use: No  . Sexual activity: Not on file   Other Topics Concern  . Not on file   Social History Narrative  . No narrative on file   Social she is widowed. She does try to be active. There is no tobacco use. She does drink occasional alcohol.  Family History  Problem Relation Age of Onset  . Hypertension Mother   . Heart disease Mother   . Stroke Mother   . Prostate cancer Father   . Heart disease Brother   . Heart attack Sister    ROS General: Negative; No fevers, chills, or night sweats;  HEENT: Negative; No changes in vision or hearing, sinus congestion, difficulty swallowing Pulmonary: Negative; No cough, wheezing, shortness of breath, hemoptysis Cardiovascular:  Negative; No chest pain, presyncope, syncope, palpitations Positive for rare, intermittent leg swelling GI: Positive for GERD; No nausea, vomiting, diarrhea, or abdominal pain GU: Negative; No dysuria, hematuria, or difficulty voiding Musculoskeletal: Positive for arthritis in her knees and hips; no myalgias, joint pain, or weakness Hematologic/Oncology: Negative; no easy bruising, bleeding Endocrine: Positive for hypothyroidism, and type 2 diabetes mellitus; Neuro: Negative; no changes in balance, headaches Skin: Negative; No rashes or skin lesions Psychiatric: Negative; No behavioral problems, depression Sleep: Negative; No snoring, daytime sleepiness, hypersomnolence, bruxism, restless legs, hypnogognic hallucinations, no cataplexy Other comprehensive 14 point system review is negative.   PE BP 140/70 (BP Location: Right Arm, Patient Position: Sitting, Cuff Size: Normal)   Pulse 68   Ht 5' 2"  (1.575 m)   Wt 182 lb (82.6 kg)   BMI 33.29 kg/m    Repeat blood pressure by me was 124/78.  Wt Readings from Last 3 Encounters:  12/19/16 182 lb (82.6 kg)  11/28/16 184 lb (83.5 kg)  05/04/16 178 lb 12.8 oz (81.1 kg)   General: Alert, oriented, no distress.  Skin: normal turgor, no rashes HEENT: Normocephalic, atraumatic. Pupils round and reactive; sclera anicteric;no lid lag.  Nose without nasal septal hypertrophy Mouth/Parynx benign; Mallinpatti scale 2 Neck: No JVD, no carotid bruits with normal carotid upstroke Lungs: Very faint rare end expiratory wheeze Chest wall: Nontender to palpation Heart: RRR, s1 s2 normal 1/6 systolic murmur. Abdomen: soft, nontender; no hepatosplenomehaly, BS+; abdominal aorta nontender and not dilated by palpation. Back: no CVA tenderness Pulses 2+ Extremities: no clubbing cyanosis or edema, Homan's sign negative  Neurologic: grossly nonfocal Psychologic: normal affect and mood.  ECG (independently read by me): Sinus rhythm at 68 bpm.  Nonspecific  T changes.  Normal intervals  September 2017 ECG (independently read by me): Normal sinus rhythm at 74 bpm with mild sinus arrhythmia.  Mild inferolateral T wave abnormalities.  September 2015 ECG (independently read by me): Normal sinus rhythm at 61 beats per minute.  QTc interval 4-6 ms.  Nonspecific ST-T  changes inferolaterally  December 2014 ECG: Normal sinus rhythm at 62 beats per minute. Nonspecific inferolateral T wave changes.  LABS: BMP Latest Ref Rng & Units 11/28/2016 12/08/2015 09/23/2015  Glucose 65 - 99 mg/dL 78 144(H) 211(H)  BUN 6 - 20 mg/dL 17 24(H) 19  Creatinine 0.44 - 1.00 mg/dL 1.13(H) 1.19(H) 1.08(H)  Sodium 135 - 145 mmol/L 138 137 137  Potassium 3.5 - 5.1 mmol/L 4.3 3.2(L) 4.2  Chloride 101 - 111 mmol/L 108 104 99(L)  CO2 22 - 32 mmol/L 20(L) 26 27  Calcium 8.9 - 10.3 mg/dL 8.8(L) 8.3(L) 8.9   Hepatic Function Latest Ref Rng & Units 11/28/2016 09/23/2015 05/30/2014  Total Protein 6.5 - 8.1 g/dL 7.9 8.4(H) 7.6  Albumin 3.5 - 5.0 g/dL 3.5 3.8 3.3(L)  AST 15 - 41 U/L 19 17 17   ALT 14 - 54 U/L 15 16 12   Alk Phosphatase 38 - 126 U/L 75 80 72  Total Bilirubin 0.3 - 1.2 mg/dL 0.1(L) 0.4 0.2(L)   CBC Latest Ref Rng & Units 11/28/2016 12/08/2015 09/23/2015  WBC 4.0 - 10.5 K/uL 8.6 9.0 9.7  Hemoglobin 12.0 - 15.0 g/dL 9.3(L) 10.9(L) 11.6(L)  Hematocrit 36.0 - 46.0 % 29.8(L) 34.0(L) 36.7  Platelets 150 - 400 K/uL 276 243 323   Lab Results  Component Value Date   MCV 79.0 11/28/2016   MCV 78.3 12/08/2015   MCV 78.9 09/23/2015   No results found for: TSH   Lab Results  Component Value Date   HGBA1C 6.6 (H) 05/29/2014   Lipid Panel     Component Value Date/Time   CHOL 108 05/30/2014 0400   TRIG 155 (H) 05/30/2014 0400   HDL 40 05/30/2014 0400   CHOLHDL 2.7 05/30/2014 0400   VLDL 31 05/30/2014 0400   LDLCALC 37 05/30/2014 0400    RADIOLOGY: No results found.  IMPRESSION:  1. Atherosclerosis of native coronary artery of native heart without angina pectoris     2. Essential hypertension   3. CAD in native artery   4. Hyperlipidemia with target LDL less than 70   5. Diabetes mellitus, type II, insulin dependent ([place])   6. Intermittent asthma without complication, unspecified asthma severity     ASSESSMENT AND PLAN: Ms. Gato is a 61 year old female with established CAD and is 11 years status post percutaneous coronary intervention to her proximal RCA.  At that time, she had a 60 - 70% stenosis of her LAD as well as 20-30%. She has experienced recurrent episodes of probable musculoskeletal chest discomfort.  A nuclear stress test following in 2015 was interpreted as high risk; cardiac catheterization did not reveal significant change in her coronary anatomy and her previously placed RCA stent was widely patent.  Her LAD lesion was not physiologically significant by fractional flow reserve.  She has been without recurrent anginal symptomatology.  Her blood pressure today is controlled on her medical regimen consisting of isosorbide 120 mg, verapamil 240 mg, and metoprolol 25 mg daily.  She also has been taking triamterene HCTZ daily.  She does not have significant edema.  She continues to be on aspirin and Plavix for antiplatelet therapy.  She is diabetic on metformin 1000 g twice a day in addition to Levemir flex pen and Humalog insulin.  She continues to take Advair for her bronchospasm.  If she continues to have issues with wheezing in the future, we may need to consider changing metoprolol succinate, which is very low dose and cardioselective to possibly bisoprolol.  She continues  to be on atorvastatin 80 mg for hyperlipidemia with target LDL less than 80.  She has only been taking this on Monday, Wednesday and Fridays.  I will try to obtain laboratory that had been done by Dr. Forde Dandy.  His long as she is stable cardiovascularly, I will see her in one year for reevaluation. Time spent: 25 minutes  Troy Sine, MD, [facility]  12/21/2016 6:41 PM

## 2016-12-20 ENCOUNTER — Telehealth: Payer: Self-pay | Admitting: Internal Medicine

## 2016-12-20 NOTE — Telephone Encounter (Signed)
Called and spoke to pt. Informed her of the recs per CY. Rx sent to preferred pharmacy. Appt made with SG on 12/22/16. Pt verbalized understanding and denied any further questions or concerns at this time.

## 2016-12-20 NOTE — Telephone Encounter (Signed)
Spoke with pt, who reports of wheezing , chest tightness, mild prod cough (unsure of color) and increased sob x41mo Pt states she is using ventolin q4h-q6h and albuterol neb daily with mild improvement. Denies any fever, chills or sweats.  Pt is requesting an apt with CY or NP.  CY please advise. Thanks.   Allergies  Allergen Reactions  . Codeine Other (See Comments)    REACTION: hallucinations/"loopy"  . Levaquin [Levofloxacin] Nausea And Vomiting  . Sulfa Antibiotics Hives  . Sulfonamide Derivatives Hives    Current Outpatient Prescriptions on File Prior to Visit  Medication Sig Dispense Refill  . ADVAIR DISKUS 250-50 MCG/DOSE AEPB INHALE ONE (1) PUFF(S) TWICE DAILY, RINSE MOUTH. 60 each 4  . albuterol (PROVENTIL HFA) 108 (90 BASE) MCG/ACT inhaler Inhale 2 puffs into the lungs every 6 (six) hours as needed for wheezing or shortness of breath. For shortness of breath and wheezing 1 Inhaler prn  . albuterol (PROVENTIL) (2.5 MG/3ML) 0.083% nebulizer solution Take 3 mLs (2.5 mg total) by nebulization every 6 (six) hours as needed for wheezing or shortness of breath. 75 mL 5  . aspirin 81 MG chewable tablet Chew 1 tablet (81 mg total) by mouth daily.    Marland Kitchen atorvastatin (LIPITOR) 80 MG tablet Take 1 tablet Monday, Wednesday, and Friday 36 tablet 3  . Calcium Carbonate Antacid (ALKA-SELTZER ANTACID PO) Take by mouth daily as needed.    . clopidogrel (PLAVIX) 75 MG tablet TAKE ONE TABLET BY MOUTH DAILY 30 tablet 6  . diazepam (VALIUM) 5 MG tablet Take 0.5 tablets by mouth as needed. .5 to 1 tablet prn for muscle cramps    . diclofenac (VOLTAREN) 75 MG EC tablet Take 75 mg by mouth daily as needed. For muscle aches    . Insulin Detemir (LEVEMIR FLEXPEN [place]) Inject 15 Units into the skin at bedtime.     . insulin lispro (HUMALOG) 100 UNIT/ML injection Inject into the skin 3 (three) times daily before meals.    Marland Kitchen ipratropium (ATROVENT) 0.02 % nebulizer solution Take 2.5 mLs (0.5 mg total) by  nebulization 4 (four) times daily. 75 mL 12  . isosorbide mononitrate (IMDUR) 120 MG 24 hr tablet TAKE ONE TABLET BY MOUTH DAILY 30 tablet 6  . levothyroxine (SYNTHROID, LEVOTHROID) 200 MCG tablet Take 200 mcg by mouth daily.    . metFORMIN (GLUMETZA) 1000 MG (MOD) 24 hr tablet Take 1 tablet (1,000 mg total) by mouth 2 (two) times daily with a meal. HOLD for 48 hours, restart on 06/01/2014.    . metoprolol succinate (TOPROL-XL) 25 MG 24 hr tablet Take 1 tablet (25 mg total) by mouth daily. 90 tablet 2  . NOVOTWIST 32G X 5 MM MISC Use as directed    . omega-3 acid ethyl esters (LOVAZA) 1 g capsule TAKE TWO CAPSULES BY MOUTH TWO TIMES A DAY 120 capsule 2  . ONE TOUCH ULTRA TEST test strip Use as directed    . pantoprazole (PROTONIX) 40 MG tablet Take 40 mg by mouth daily.    . Potassium Chloride ER 20 MEQ TBCR TAKE TWO TABLETS BY MOUTH ONCE DAILY 60 tablet 2  . triamterene-hydrochlorothiazide (MAXZIDE-25) 37.5-25 MG per tablet Take 1 tablet by mouth daily.    . verapamil (CALAN-SR) 240 MG CR tablet Take 1 tablet by mouth daily.     No current facility-administered medications on file prior to visit.

## 2016-12-20 NOTE — Telephone Encounter (Signed)
Let's call in a prednisone taper  For now, to get her started. Ok then to see NP when available Prednisone 10 mg, # 20  4 X 2 DAYS, 3 X 2 DAYS, 2 X 2 DAYS, 1 X 2 DAYS

## 2016-12-21 DIAGNOSIS — R062 Wheezing: Secondary | ICD-10-CM | POA: Diagnosis not present

## 2016-12-21 DIAGNOSIS — E1151 Type 2 diabetes mellitus with diabetic peripheral angiopathy without gangrene: Secondary | ICD-10-CM | POA: Diagnosis not present

## 2016-12-21 DIAGNOSIS — J45909 Unspecified asthma, uncomplicated: Secondary | ICD-10-CM | POA: Diagnosis not present

## 2016-12-22 ENCOUNTER — Ambulatory Visit: Payer: 59 | Admitting: Acute Care

## 2017-01-02 DIAGNOSIS — J441 Chronic obstructive pulmonary disease with (acute) exacerbation: Secondary | ICD-10-CM | POA: Diagnosis not present

## 2017-01-06 DIAGNOSIS — G8929 Other chronic pain: Secondary | ICD-10-CM | POA: Diagnosis not present

## 2017-01-06 DIAGNOSIS — M79602 Pain in left arm: Secondary | ICD-10-CM | POA: Diagnosis not present

## 2017-01-06 DIAGNOSIS — M25512 Pain in left shoulder: Secondary | ICD-10-CM | POA: Diagnosis not present

## 2017-01-16 ENCOUNTER — Other Ambulatory Visit: Payer: Self-pay | Admitting: Cardiovascular Disease

## 2017-01-23 DIAGNOSIS — M25512 Pain in left shoulder: Secondary | ICD-10-CM | POA: Diagnosis not present

## 2017-02-01 DIAGNOSIS — M79602 Pain in left arm: Secondary | ICD-10-CM | POA: Diagnosis not present

## 2017-02-01 DIAGNOSIS — G8929 Other chronic pain: Secondary | ICD-10-CM | POA: Diagnosis not present

## 2017-02-01 DIAGNOSIS — M25512 Pain in left shoulder: Secondary | ICD-10-CM | POA: Diagnosis not present

## 2017-02-01 DIAGNOSIS — M25511 Pain in right shoulder: Secondary | ICD-10-CM | POA: Diagnosis not present

## 2017-02-07 ENCOUNTER — Telehealth: Payer: Self-pay | Admitting: *Deleted

## 2017-02-07 DIAGNOSIS — M5126 Other intervertebral disc displacement, lumbar region: Secondary | ICD-10-CM | POA: Diagnosis not present

## 2017-02-07 DIAGNOSIS — M545 Low back pain: Secondary | ICD-10-CM | POA: Diagnosis not present

## 2017-02-07 DIAGNOSIS — M48062 Spinal stenosis, lumbar region with neurogenic claudication: Secondary | ICD-10-CM | POA: Diagnosis not present

## 2017-02-07 NOTE — Telephone Encounter (Signed)
Surgical clearance to [facility] for wrist surgery scheduled for 03/07/17 to be done by Dr Amedeo Plenty.

## 2017-02-11 IMAGING — CT CT PARANASAL SINUSES LIMITED
1 series · 9 of 11 positions shown, 12 images · non-contrast
Comparison: None.

CLINICAL DATA: Sinus pressure and congestion since [REDACTED].

EXAM:
CT PARANASAL SINUS LIMITED WITHOUT CONTRAST
TECHNIQUE: Non-contiguous multidetector CT images of the paranasal sinuses were
obtained in a single plane without contrast.

[Series 3: limited sinus st · axial · 0.32mm/px · z∈[+519,+599]mm · 9 of 11 slices shown, 12 images]
[im 2/11  brain]
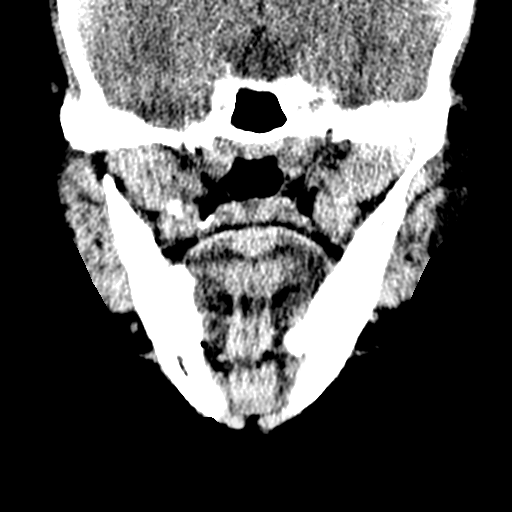
[im 2/11  bone]
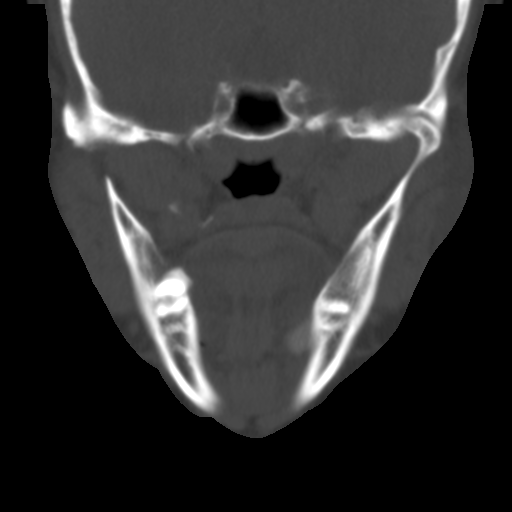
[im 3/11  bone]
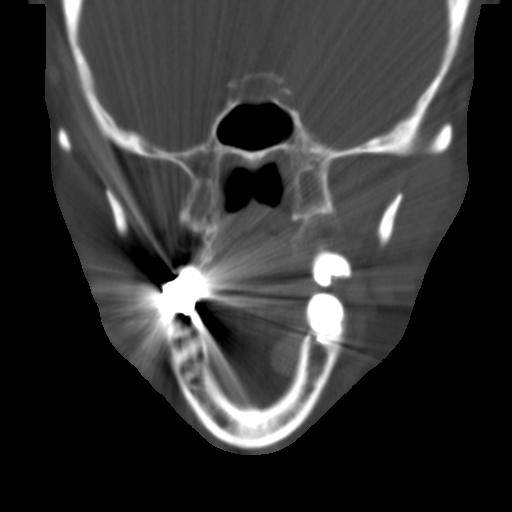
[im 4/11  bone]
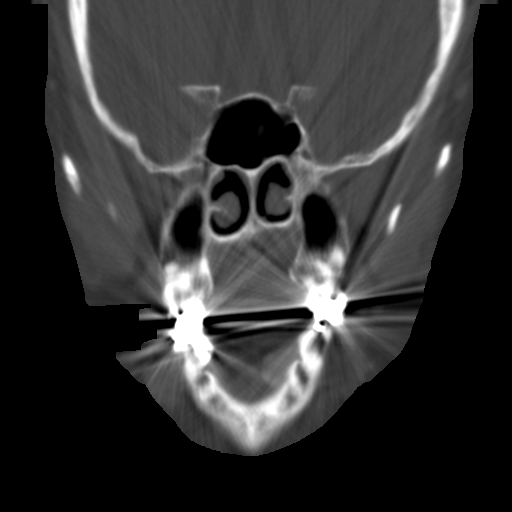
[im 5/11  bone]
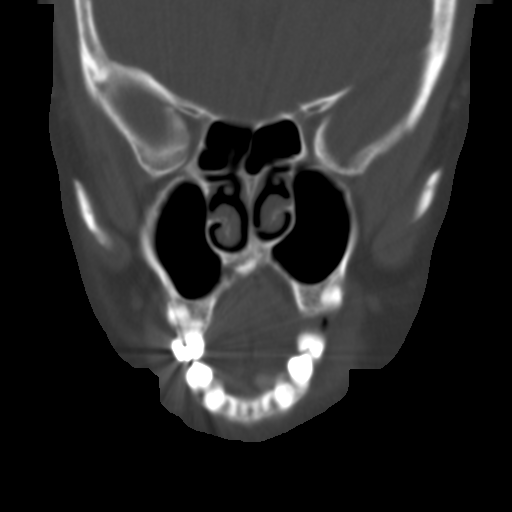
[im 6/11  brain]
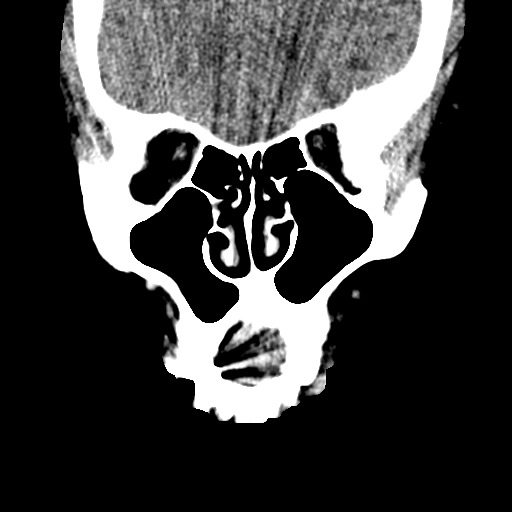
[im 6/11  bone]
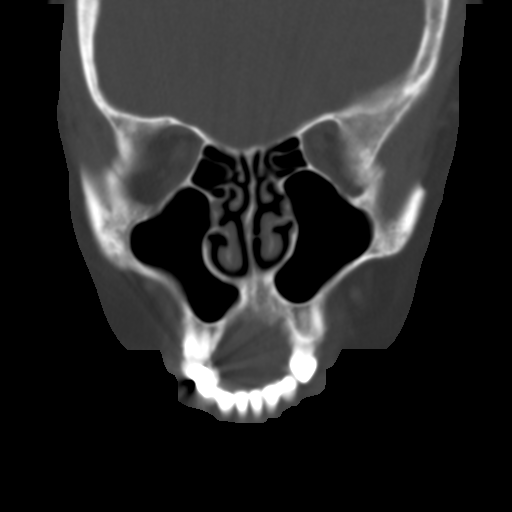
[im 7/11  bone]
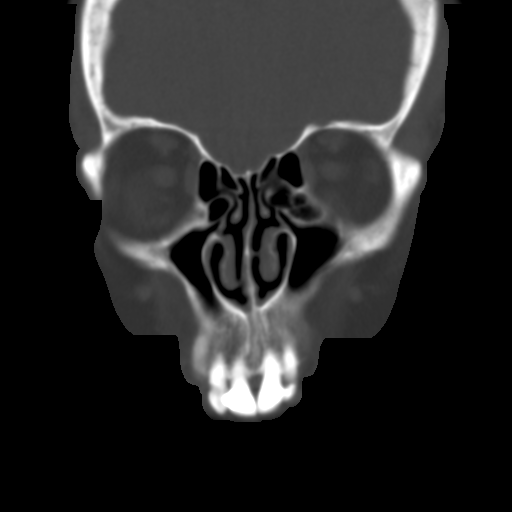
[im 8/11  bone]
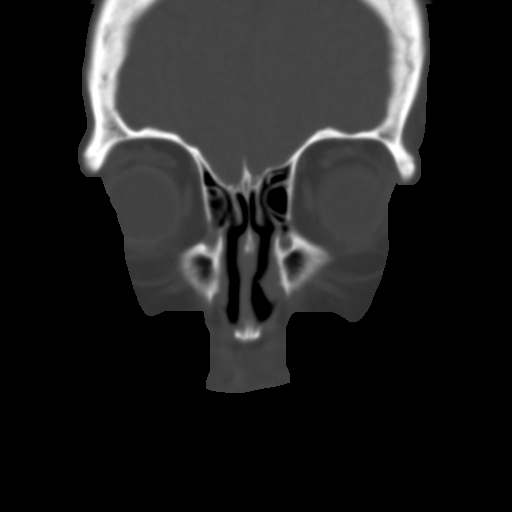
[im 9/11  bone]
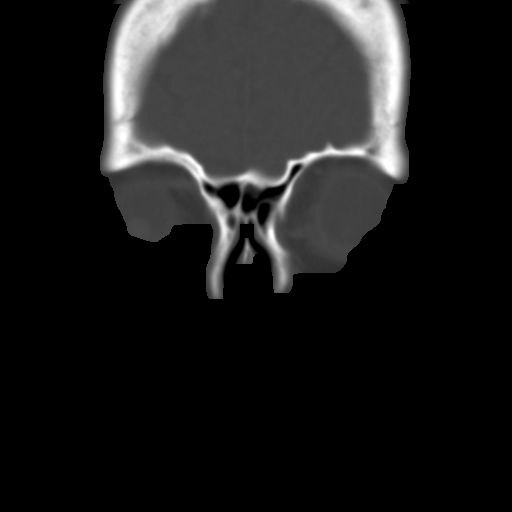
[im 10/11  brain]
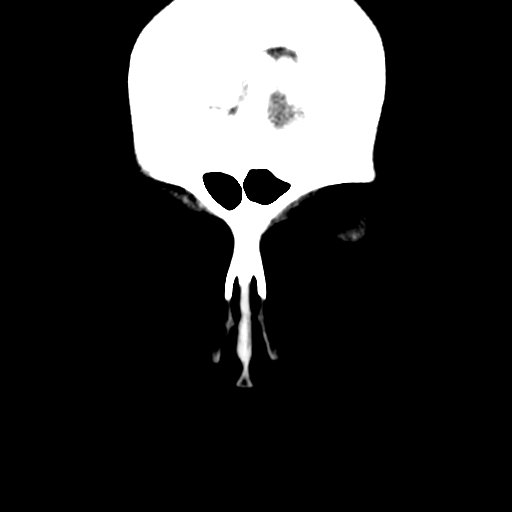
[im 10/11  bone]
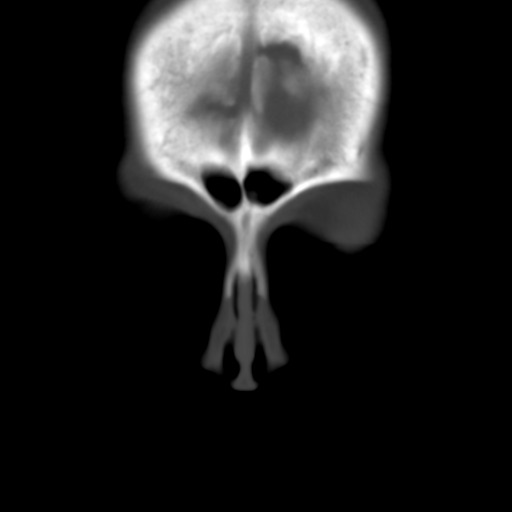

[9 of 11 positions shown; findings below may reference images not displayed]

FINDINGS: The globes and extraocular muscles appear symmetrical.

No air fluid levels in the paranasal sinuses. No erosive changes of
the sinus walls or periosteal thickening.

The frontal bones, orbital rims, maxillary antral walls, nasal
bones, nasal septum, nasal spine, maxilla, pterygoid plates,
zygomatic arches, temporomandibular joints appear intact.
IMPRESSION: No evidence of acute or chronic sinusitis.

## 2017-02-14 DIAGNOSIS — M48062 Spinal stenosis, lumbar region with neurogenic claudication: Secondary | ICD-10-CM | POA: Diagnosis not present

## 2017-02-21 DIAGNOSIS — M545 Low back pain: Secondary | ICD-10-CM | POA: Diagnosis not present

## 2017-02-21 DIAGNOSIS — M48062 Spinal stenosis, lumbar region with neurogenic claudication: Secondary | ICD-10-CM | POA: Diagnosis not present

## 2017-02-21 DIAGNOSIS — M7062 Trochanteric bursitis, left hip: Secondary | ICD-10-CM | POA: Diagnosis not present

## 2017-02-23 DIAGNOSIS — E119 Type 2 diabetes mellitus without complications: Secondary | ICD-10-CM | POA: Diagnosis not present

## 2017-02-23 DIAGNOSIS — H2513 Age-related nuclear cataract, bilateral: Secondary | ICD-10-CM | POA: Diagnosis not present

## 2017-02-23 DIAGNOSIS — Z794 Long term (current) use of insulin: Secondary | ICD-10-CM | POA: Diagnosis not present

## 2017-02-27 ENCOUNTER — Telehealth: Payer: Self-pay | Admitting: Cardiovascular Disease

## 2017-02-27 NOTE — Telephone Encounter (Signed)
° °  [facility] Pre-operative Risk Assessment    Request for surgical clearance:  1. What type of surgery is being performed? Left Hand for carpel tunnel  2. When is this surgery scheduled? 03-07-17   3. Are there any medications that need to be held prior to surgery and how long? Is it okay to hold plavix for 5 days?  4. Name of physician performing surgery? Dr. Roseanne Kaufman, [place]  5. What is your office phone and fax number?   Patient calling  [name] 02/27/2017, 1:23 PM  _________________________________________________________________   (provider comments below)

## 2017-02-28 NOTE — Telephone Encounter (Signed)
[facility] for surgery; hold plavix for 5 days prior to surgery

## 2017-03-01 DIAGNOSIS — I10 Essential (primary) hypertension: Secondary | ICD-10-CM | POA: Diagnosis not present

## 2017-03-01 DIAGNOSIS — E1149 Type 2 diabetes mellitus with other diabetic neurological complication: Secondary | ICD-10-CM | POA: Diagnosis not present

## 2017-03-01 NOTE — Telephone Encounter (Signed)
Clearance sent to Dr Amedeo Plenty via [facility].

## 2017-03-07 DIAGNOSIS — G5602 Carpal tunnel syndrome, left upper limb: Secondary | ICD-10-CM | POA: Diagnosis not present

## 2017-03-14 DIAGNOSIS — G5602 Carpal tunnel syndrome, left upper limb: Secondary | ICD-10-CM | POA: Diagnosis not present

## 2017-03-21 ENCOUNTER — Ambulatory Visit: Payer: 59 | Attending: Orthopedic Surgery | Admitting: Physical Therapy

## 2017-03-21 DIAGNOSIS — M25611 Stiffness of right shoulder, not elsewhere classified: Secondary | ICD-10-CM

## 2017-03-21 DIAGNOSIS — G5602 Carpal tunnel syndrome, left upper limb: Secondary | ICD-10-CM | POA: Diagnosis not present

## 2017-03-21 DIAGNOSIS — M25511 Pain in right shoulder: Secondary | ICD-10-CM

## 2017-03-21 DIAGNOSIS — M6281 Muscle weakness (generalized): Secondary | ICD-10-CM

## 2017-03-21 DIAGNOSIS — R293 Abnormal posture: Secondary | ICD-10-CM | POA: Diagnosis not present

## 2017-03-21 NOTE — Therapy (Signed)
[facility] [address]  [place], [name], [address] Phone: [phone]   Fax:  [phone]  Physical Therapy Evaluation  Patient Details  Name: GISELA LEA MRN: [id number] Date of Birth: [birth year] Referring Provider: Roseanne Kaufman, MD  Encounter Date: 03/21/2017      PT End of Session - 03/21/17 1445    Visit Number 1   Number of Visits 12   Date for PT Re-Evaluation 05/02/17   Authorization Type [place] - PT only   Authorization - Number of Visits 60   PT Start Time 1017   PT Stop Time 1536   PT Time Calculation (min) 51 min   Activity Tolerance Patient tolerated treatment well   Behavior During Therapy [facility] for tasks assessed/performed      Past Medical History:  Diagnosis Date  . Acute bronchitis   . Anemia    hx of anemia  . Anginal pain ([place])   . Arthritis    osteo  . Coronary atherosclerosis   . Cough   . Hypertension   . Obstructive sleep apnea (adult) (pediatric)   . Type II or unspecified type diabetes mellitus without mention of complication, not stated as uncontrolled    type 2  . Unspecified hypothyroidism     Past Surgical History:  Procedure Laterality Date  . ANTERIOR FUSION CERVICAL SPINE    . LEFT HEART CATHETERIZATION WITH CORONARY ANGIOGRAM N/A 05/29/2014   Procedure: LEFT HEART CATHETERIZATION WITH CORONARY ANGIOGRAM;  Surgeon: Sinclair Grooms, MD;  Location: [facility] CATH LAB;  Service: Cardiovascular;  Laterality: N/A;  . TUBAL LIGATION    . WISDOM TOOTH EXTRACTION      There were no vitals filed for this visit.       Subjective Assessment - 03/21/17 1449    Subjective Pt presents to PT with 2 referrals (neck & shoulders and lumbar) but pt wanting to focus on shoulders first. Reports several year h/o neck and shoulder pain s/p cervical diskectomy in 2008 or 2009. Now noting worsening of R shoulder pain since L CTR surgery on 03/07/17 d/t having to use R UE more  while recovering. Received injection which has given good relief in the past, but not much benefit this time.    Limitations House hold activities;Lifting   Patient Stated Goals "to get the pain level down & use the R arm more"   Currently in Pain? Yes   Pain Score 10-Worst pain ever   Pain Location Shoulder   Pain Orientation Right   Pain Descriptors / Indicators Throbbing   Pain Type Acute pain;Chronic pain   Pain Radiating Towards down anterior arm to just above wrist   Pain Onset 1 to 4 weeks ago   Pain Frequency Intermittent   Aggravating Factors  pulling with R arm, reaching overhead   Pain Relieving Factors cradle with L arm, rest, OTC pain meds, heat   Effect of Pain on Daily Activities difficulty with lifting or putting pressure through R arm            [facility] PT Assessment - 03/21/17 1445      Assessment   Medical Diagnosis R shoulder pain   Referring Provider Roseanne Kaufman, MD   Onset Date/Surgical Date 01/06/17   Hand Dominance Right   Next MD Visit 03/30/17 - f/u for carpal tunnel   Prior Therapy PT for neck & shoulders 2016; OT for carpal tunnel & shoulders 2017  Balance Screen   Has the patient fallen in the past 6 months No   Has the patient had a decrease in activity level because of a fear of falling?  No   Is the patient reluctant to leave their home because of a fear of falling?  No     Home Environment   Living Environment Private residence   Living Arrangements Other relatives  sister   Type of [place] to enter   Entrance Stairs-Number of Steps 4 or 2   Home Layout Two level;Bed/bath upstairs     Prior Function   Level of Independence Independent   Vocation Full time employment   Vocation Requirements bus driver   Leisure gardening     Observation/Other Assessments   Focus on Therapeutic Outcomes (FOTO)  Shoulder - 41% (59% limitation); predicted 59% (41% limitation)     Posture/Postural Control   Posture/Postural  Control Postural limitations   Postural Limitations Forward head;Rounded Shoulders     ROM / Strength   AROM / PROM / Strength AROM;PROM;Strength     AROM   AROM Assessment Site Shoulder;Cervical   Right/Left Shoulder Right;Left   Right Shoulder Flexion 148 Degrees   Right Shoulder ABduction 140 Degrees   Right Shoulder Internal Rotation --  FER to L4   Right Shoulder External Rotation --  FER to C3   Left Shoulder Flexion 160 Degrees   Left Shoulder ABduction 145 Degrees   Left Shoulder Internal Rotation --  FER to L2   Left Shoulder External Rotation --  FER to C5   Cervical Flexion 39   Cervical Extension 30   Cervical - Right Side Bend 19   Cervical - Left Side Bend 18   Cervical - Right Rotation 46   Cervical - Left Rotation 48     PROM   Overall PROM Comments B shoulder PROM WNL except R IR as below   PROM Assessment Site Shoulder   Right/Left Shoulder Right;Left   Right Shoulder Internal Rotation 46 Degrees     Strength   Strength Assessment Site Shoulder   Right/Left Shoulder Right;Left   Right Shoulder Flexion 3+/5   Right Shoulder ABduction 4-/5   Right Shoulder Internal Rotation 4-/5   Right Shoulder External Rotation 3-/5   Left Shoulder Flexion 4-/5   Left Shoulder ABduction 4/5   Left Shoulder Internal Rotation 4/5   Left Shoulder External Rotation 4-/5     Palpation   Palpation comment ttp with increased muscle tension in R lateral pecs, UT and posterior capsule (esp teres group)            Objective measurements completed on examination: See above findings.                  PT Education - 03/21/17 1536    Education provided Yes   Education Details PT eval findings, anticipated POC & initial HEP   Person(s) Educated Patient   Methods Explanation;Demonstration;Handout   Comprehension Verbalized understanding;Returned demonstration;Need further instruction             PT Long Term Goals - 03/21/17 1536      PT LONG  TERM GOAL #1   Title Pt reports able to perform ADLs, chores, and recreational activities without limitation by R shoulder or neck pain   Status New   Target Date 05/02/17     PT LONG TERM GOAL #2   Title R Shoulder AROM WFL and MMT grossly  4+/5 or better   Status New   Target Date 05/02/17     PT LONG TERM GOAL #3   Title Independent with ongoing HEP   Status New   Target Date 05/02/17                Plan - 04/04/2017 1536    Clinical Impression Statement Arshiya is a 61 y/o female who presents to OP PT for a moderate complexity eval with referral listing multiple cervical, shoulder and UE diagnoses as well as a separate referral from Dr. Donnald Garre for low back pain. Pt reports her primary concern is shoulder pain, esp R shoulder, limiting functional UE use, therefore this will be initial focus of PT. Pt reports h/o B shoulder and neck pain for which she has received prior PT but states current R shoulder pain started as she had to use R UE more while recovering from L CTR surgery. Assessment reveals limited B shoulder AROM, R > L, with catching sensation upon lowering R UE from overhead position. Pt demonstrates forward head and shoulder posture which places shoulder at risk for impingement. Weakness noted in B shoulders, R > L. Pain, weakness and limited ROM limit ability to reach behind back or overhead and prevent pt from lifting. Pt demonstrates good potential to benefit from skilled PT with POC focusing on reinforcing postural awareness, scapular strengthening/stabilization, shoulder/RTC strengthening, with manual therapy and modalities PRN for pain and normalization of muscle tension as indicated.   History and Personal Factors relevant to plan of care: chronic neck & B shoulder pain with h/o ACDF; L carpal & cubital tunnel syndrome; lumbosacral DDD; lumbar stenosis with neurogenic claudication   Clinical Presentation Evolving   Clinical Presentation due to: worsening pain and  ROM restriction over past 3 weeks + multiple comorbidities   Clinical Decision Making Moderate   Rehab Potential Good   Clinical Impairments Affecting Rehab Potential chronic neck & B shoulder pain with h/o ACDF; L carpal & cubital tunnel syndrome; lumbosacral DDD; lumbar stenosis with neurogenic claudication   PT Frequency 2x / week   PT Duration 6 weeks   PT Treatment/Interventions Patient/family education;Neuromuscular re-education;ADLs/Self Care Home Management;Manual techniques;Dry needling;Taping;Passive range of motion;Therapeutic exercise;Therapeutic activities;Electrical Stimulation;Moist Heat;Cryotherapy;Iontophoresis 4mg /ml Dexamethasone   Consulted and Agree with Plan of Care Patient      Patient will benefit from skilled therapeutic intervention in order to improve the following deficits and impairments:  Pain, Postural dysfunction, Decreased range of motion, Decreased strength, Impaired UE functional use, Increased muscle spasms, Increased fascial restricitons, Decreased activity tolerance  Visit Diagnosis: Acute pain of right shoulder  Stiffness of right shoulder, not elsewhere classified  Abnormal posture  Muscle weakness (generalized)      G-Codes - 04-04-17 1545    Functional Assessment Tool Used (Outpatient Only) Shoulder - 41% (59% limitation)   Functional Limitation Carrying, moving and handling objects   Carrying, Moving and Handling Objects Current Status (K0254) At least 40 percent but less than 60 percent impaired, limited or restricted   Carrying, Moving and Handling Objects Goal Status (Y7062) At least 40 percent but less than 60 percent impaired, limited or restricted       Problem List Patient Active Problem List   Diagnosis Date Noted  . Acute bronchitis 08/10/2014  . Unstable angina ([place]) 05/29/2014  . Abnormal nuclear stress test 05/29/2014  . Chest pain 05/06/2014  . Exertional shortness of breath 05/06/2014  . Mixed hyperlipidemia 05/06/2014   . Hyperlipidemia with target LDL less  than 70 08/05/2013  . HTN (hypertension) 08/05/2013  . Chest wall pain 08/05/2013  . SINUSITIS, ACUTE 04/07/2009  . HYPOTHYROIDISM 12/10/2007  . DIABETES MELLITUS, TYPE II 05/28/2007  . Obstructive sleep apnea 05/28/2007  . Coronary atherosclerosis 05/28/2007  . Seasonal and perennial allergic rhinitis 05/28/2007  . COUGH, CHRONIC 05/28/2007    Percival Spanish, PT, MPT 03/21/2017, 5:27 PM  [facility] [address]  [place], [name], [address] Phone: [phone]   Fax:  [phone]  Name: BEYONCE SAWATZKY MRN: [id number] Date of Birth: 1955-10-17

## 2017-03-29 ENCOUNTER — Ambulatory Visit: Payer: 59 | Admitting: Physical Therapy

## 2017-03-29 DIAGNOSIS — M25611 Stiffness of right shoulder, not elsewhere classified: Secondary | ICD-10-CM

## 2017-03-29 DIAGNOSIS — M25511 Pain in right shoulder: Secondary | ICD-10-CM | POA: Diagnosis not present

## 2017-03-29 DIAGNOSIS — R293 Abnormal posture: Secondary | ICD-10-CM

## 2017-03-29 DIAGNOSIS — M6281 Muscle weakness (generalized): Secondary | ICD-10-CM

## 2017-03-29 NOTE — Therapy (Addendum)
[facility] [address]  [place], [name], [address] Phone: [phone]   Fax:  [phone]  Physical Therapy Treatment  Patient Details  [name] MRN: [id number] Date of Birth: 02-Jun-1956 Referring Provider: Roseanne Kaufman, MD  Encounter Date: 03/29/2017      PT End of Session - 03/29/17 1101    Visit Number 2   Number of Visits 12   Date for PT Re-Evaluation 05/02/17   Authorization Type [place] - PT only   Authorization - Number of Visits 60   PT Start Time 1101   PT Stop Time 1146   PT Time Calculation (min) 45 min   Activity Tolerance Patient tolerated treatment well   Behavior During Therapy [facility] for tasks assessed/performed      Past Medical History:  Diagnosis Date  . Acute bronchitis   . Anemia    hx of anemia  . Anginal pain ([place])   . Arthritis    osteo  . Coronary atherosclerosis   . Cough   . Hypertension   . Obstructive sleep apnea (adult) (pediatric)   . Type II or unspecified type diabetes mellitus without mention of complication, not stated as uncontrolled    type 2  . Unspecified hypothyroidism     Past Surgical History:  Procedure Laterality Date  . ANTERIOR FUSION CERVICAL SPINE    . LEFT HEART CATHETERIZATION WITH CORONARY ANGIOGRAM N/A 05/29/2014   Procedure: LEFT HEART CATHETERIZATION WITH CORONARY ANGIOGRAM;  Surgeon: Sinclair Grooms, MD;  Location: [facility] CATH LAB;  Service: Cardiovascular;  Laterality: N/A;  . TUBAL LIGATION    . WISDOM TOOTH EXTRACTION      There were no vitals filed for this visit.      Subjective Assessment - 03/29/17 1103    Subjective Pr reports her R shoulder was flared-up over the weekend but seems to be settling down some at this point.   Patient Stated Goals "to get the pain level down & use the R arm more"   Currently in Pain? Yes   Pain Score 5   up to 10/10 sharp pain with certain motions/activities   Pain Location  Shoulder   Pain Orientation Right   Pain Descriptors / Indicators Throbbing   Pain Type Acute pain;Chronic pain   Pain Onset 1 to 4 weeks ago   Pain Frequency Intermittent                         [facility] Adult PT Treatment/Exercise - 03/29/17 1101      Exercises   Exercises Shoulder     Shoulder Exercises: Supine   Flexion Right;AAROM;10 reps   Flexion Limitations wand, 5" hold     Shoulder Exercises: Seated   Retraction Both;10 reps  5" hold   Other Seated Exercises Chin tuck 10x5"     Shoulder Exercises: Standing   ABduction Right;AAROM;10 reps   ABduction Limitations wand, 5" hold     Shoulder Exercises: ROM/Strengthening   UBE (Upper Arm Bike) lvl 1.5 fwd/back x 2" each     Shoulder Exercises: Stretch   Corner Stretch 30 seconds;1 rep  each   Corner Stretch Limitations low/mid/high doorway stretch     Manual Therapy   Manual Therapy Soft tissue mobilization;Myofascial release   Manual therapy comments pt hooklying over pool noodle   Soft tissue mobilization B UT, LS & pecs; R posterior capsule   Myofascial Release  TPR to R UT, pec major/minor & teres group                PT Education - 03/29/17 1145    Education provided Yes   Education Details wand AAROM R shoulder flexion & ABD   Person(s) Educated Patient   Methods Explanation;Demonstration;Handout   Comprehension Verbalized understanding;Returned demonstration             PT Long Term Goals - 03/29/17 1105      PT LONG TERM GOAL #1   Title Pt reports able to perform ADLs, chores, and recreational activities without limitation by R shoulder or neck pain   Status On-going     PT LONG TERM GOAL #2   Title R Shoulder AROM WFL and MMT grossly 4+/5 or better   Status On-going     PT LONG TERM GOAL #3   Title Independent with ongoing HEP   Status On-going               Plan - 03/29/17 1106    Clinical Impression Statement Pt reporting flare-up of pain over the  weekend, which upon further discussion was determined may have been due to mowing and raking her lawn. Reports difficulty completing HEP due to increased pain, therefore upon review reminded pt to avoid pushing motions and/or stretches into painful ranges, with pt able to provide return demosntration with less pain after review. Majority of visit focused on STM & MFR/TPR to B upper shoulder and full R shoulder complex. Aften manual therapy, pt able to demonstrate full AA/AROM of R shoulder in flexion and abduction and reporting pain completely resolved. Will plan to progess scapular stabilization/strengtheing to promote improved postural control and prevent further loss of motion.   Clinical Impairments Affecting Rehab Potential chronic neck & B shoulder pain with h/o ACDF; L carpal & cubital tunnel syndrome; lumbosacral DDD; lumbar stenosis with neurogenic claudication   PT Duration 6 weeks   PT Treatment/Interventions Patient/family education;Neuromuscular re-education;ADLs/Self Care Home Management;Manual techniques;Dry needling;Taping;Passive range of motion;Therapeutic exercise;Therapeutic activities;Electrical Stimulation;Moist Heat;Cryotherapy;Iontophoresis 4mg /ml Dexamethasone   Consulted and Agree with Plan of Care Patient      Patient will benefit from skilled therapeutic intervention in order to improve the following deficits and impairments:  Pain, Postural dysfunction, Decreased range of motion, Decreased strength, Impaired UE functional use, Increased muscle spasms, Increased fascial restricitons, Decreased activity tolerance  Visit Diagnosis: Acute pain of right shoulder  Stiffness of right shoulder, not elsewhere classified  Abnormal posture  Muscle weakness (generalized)     Problem List Patient Active Problem List   Diagnosis Date Noted  . Acute bronchitis 08/10/2014  . Unstable angina ([place]) 05/29/2014  . Abnormal nuclear stress test 05/29/2014  . Chest pain 05/06/2014   . Exertional shortness of breath 05/06/2014  . Mixed hyperlipidemia 05/06/2014  . Hyperlipidemia with target LDL less than 70 08/05/2013  . HTN (hypertension) 08/05/2013  . Chest wall pain 08/05/2013  . SINUSITIS, ACUTE 04/07/2009  . HYPOTHYROIDISM 12/10/2007  . DIABETES MELLITUS, TYPE II 05/28/2007  . Obstructive sleep apnea 05/28/2007  . Coronary atherosclerosis 05/28/2007  . Seasonal and perennial allergic rhinitis 05/28/2007  . COUGH, CHRONIC 05/28/2007    Percival Spanish, PT, MPT 03/29/2017, 1:49 PM  [facility] [address]  [place], [name], [address] Phone: [phone]   Fax:  [phone]  [name] MRN: [id number] Date of Birth: 1955-11-21

## 2017-03-31 ENCOUNTER — Ambulatory Visit: Payer: 59 | Admitting: Physical Therapy

## 2017-03-31 DIAGNOSIS — M25511 Pain in right shoulder: Secondary | ICD-10-CM

## 2017-03-31 DIAGNOSIS — M6281 Muscle weakness (generalized): Secondary | ICD-10-CM

## 2017-03-31 DIAGNOSIS — R293 Abnormal posture: Secondary | ICD-10-CM

## 2017-03-31 DIAGNOSIS — M25611 Stiffness of right shoulder, not elsewhere classified: Secondary | ICD-10-CM

## 2017-03-31 NOTE — Therapy (Signed)
[facility] [address]  [place], [name], [address] Phone: [phone]   Fax:  [phone]  Physical Therapy Treatment  Patient Details  [name] MRN: [id number] Date of Birth: June 11, 1956 Referring Provider: Roseanne Kaufman, MD  Encounter Date: 03/31/2017      PT End of Session - 03/31/17 1100    Visit Number 3   Number of Visits 12   Date for PT Re-Evaluation 05/02/17   Authorization Type [place] - PT only   Authorization - Number of Visits 60   PT Start Time 1100   PT Stop Time 1145   PT Time Calculation (min) 45 min   Activity Tolerance Patient tolerated treatment well   Behavior During Therapy [facility] for tasks assessed/performed      Past Medical History:  Diagnosis Date  . Acute bronchitis   . Anemia    hx of anemia  . Anginal pain ([place])   . Arthritis    osteo  . Coronary atherosclerosis   . Cough   . Hypertension   . Obstructive sleep apnea (adult) (pediatric)   . Type II or unspecified type diabetes mellitus without mention of complication, not stated as uncontrolled    type 2  . Unspecified hypothyroidism     Past Surgical History:  Procedure Laterality Date  . ANTERIOR FUSION CERVICAL SPINE    . LEFT HEART CATHETERIZATION WITH CORONARY ANGIOGRAM N/A 05/29/2014   Procedure: LEFT HEART CATHETERIZATION WITH CORONARY ANGIOGRAM;  Surgeon: Sinclair Grooms, MD;  Location: [facility] CATH LAB;  Service: Cardiovascular;  Laterality: N/A;  . TUBAL LIGATION    . WISDOM TOOTH EXTRACTION      There were no vitals filed for this visit.      Subjective Assessment - 03/31/17 1100    Subjective Pt reporting good relief from manual therapy last visit with increased ability to use arm with much less pain.   Patient Stated Goals "to get the pain level down & use the R arm more"   Currently in Pain? Yes   Pain Score 2    Pain Location Shoulder   Pain Orientation Right   Pain Descriptors  / Indicators Discomfort   Pain Type Acute pain;Chronic pain   Pain Radiating Towards into R upper arm                         [facility] Adult PT Treatment/Exercise - 03/31/17 1100      Self-Care   Self-Care Other Self-Care Comments   Other Self-Care Comments  Instructed pt in self-STM with small ball to pecs, teres group and parascapular muscles.     Shoulder Exercises: Supine   Horizontal ABduction Both;15 reps;Theraband;Strengthening   Theraband Level (Shoulder Horizontal ABduction) Level 1 (Yellow)   Horizontal ABduction Limitations hooklying on pool noodle   External Rotation Both;5 reps;Theraband;Strengthening   Theraband Level (Shoulder External Rotation) Level 1 (Yellow)   External Rotation Limitations deferred d/t increased R upper arm pain   Flexion Right;15 reps;AAROM;Strengthening   Flexion Limitations 2# on cane, 5"hold; hooklying on pool noodle   Other Supine Exercises alt shoulder flexion & opp shoulder extension with yellow TB x10 each way; hookying on pool noodle     Shoulder Exercises: Pulleys   Flexion 2 minutes   ABduction 2 minutes   ABduction Limitations scaption/abduction     Shoulder Exercises: ROM/Strengthening   UBE (Upper Arm Bike) lvl 2.0 fwd/back x  3" each     Manual Therapy   Manual Therapy Soft tissue mobilization;Myofascial release   Manual therapy comments pt hooklying over pool noodle   Soft tissue mobilization R pecs, R posterior capsule, R biceps   Myofascial Release TPR to R pec major/minor & teres group                PT Education - 03/31/17 1145    Education provided Yes   Education Details Self-STM with small ball; HEP addition - B shoulder horiz ABD with yellow TB   [name](s) Educated Patient   Methods Explanation;Demonstration;Handout   Comprehension Verbalized understanding;Returned demonstration;Need further instruction             PT Long Term Goals - 03/29/17 1105      PT LONG TERM GOAL #1   Title  Pt reports able to perform ADLs, chores, and recreational activities without limitation by R shoulder or neck pain   Status On-going     PT LONG TERM GOAL #2   Title R Shoulder AROM WFL and MMT grossly 4+/5 or better   Status On-going     PT LONG TERM GOAL #3   Title Independent with ongoing HEP   Status On-going               Plan - 03/31/17 1109    Clinical Impression Statement Pt reporting significant relief of pain, improved flexibility/ROM and increased ease of use of R UE following manual therapy last visit. Continued increased muscle tension noted in R pec major/minor, teres group, ant deltoid and biceps, but seems to be mostly response to manual STM/TPR - may consider DN if this persists. Pt tolerating progression of ROM exercises as well as introduction of basic scapular strengthening/stabilization. HEP updated to include self-STM and horiz ABD with scapular emphasis.   Clinical Impairments Affecting Rehab Potential chronic neck & B shoulder pain with h/o ACDF; L carpal & cubital tunnel syndrome; lumbosacral DDD; lumbar stenosis with neurogenic claudication   PT Duration 6 weeks   PT Treatment/Interventions Patient/family education;Neuromuscular re-education;ADLs/Self Care Home Management;Manual techniques;Dry needling;Taping;Passive range of motion;Therapeutic exercise;Therapeutic activities;Electrical Stimulation;Moist Heat;Cryotherapy;Iontophoresis 4mg /ml Dexamethasone   Consulted and Agree with Plan of Care Patient      Patient will benefit from skilled therapeutic intervention in order to improve the following deficits and impairments:  Pain, Postural dysfunction, Decreased range of motion, Decreased strength, Impaired UE functional use, Increased muscle spasms, Increased fascial restricitons, Decreased activity tolerance  Visit Diagnosis: Acute pain of right shoulder  Stiffness of right shoulder, not elsewhere classified  Abnormal posture  Muscle weakness  (generalized)     Problem List Patient Active Problem List   Diagnosis Date Noted  . Acute bronchitis 08/10/2014  . Unstable angina ([place]) 05/29/2014  . Abnormal nuclear stress test 05/29/2014  . Chest pain 05/06/2014  . Exertional shortness of breath 05/06/2014  . Mixed hyperlipidemia 05/06/2014  . Hyperlipidemia with target LDL less than 70 08/05/2013  . HTN (hypertension) 08/05/2013  . Chest wall pain 08/05/2013  . SINUSITIS, ACUTE 04/07/2009  . HYPOTHYROIDISM 12/10/2007  . DIABETES MELLITUS, TYPE II 05/28/2007  . Obstructive sleep apnea 05/28/2007  . Coronary atherosclerosis 05/28/2007  . Seasonal and perennial allergic rhinitis 05/28/2007  . COUGH, CHRONIC 05/28/2007    Percival Spanish, PT, MPT 03/31/2017, 12:02 PM  [facility] [address]  [place], [name], [address] Phone: [phone]   Fax:  [phone]  [name]  MRN: [id number] Date of Birth: Dec 09, 1955

## 2017-04-03 ENCOUNTER — Ambulatory Visit: Payer: 59 | Admitting: Physical Therapy

## 2017-04-06 ENCOUNTER — Ambulatory Visit: Payer: 59 | Admitting: Physical Therapy

## 2017-04-06 DIAGNOSIS — M25611 Stiffness of right shoulder, not elsewhere classified: Secondary | ICD-10-CM

## 2017-04-06 DIAGNOSIS — R293 Abnormal posture: Secondary | ICD-10-CM

## 2017-04-06 DIAGNOSIS — M25511 Pain in right shoulder: Secondary | ICD-10-CM

## 2017-04-06 DIAGNOSIS — M6281 Muscle weakness (generalized): Secondary | ICD-10-CM

## 2017-04-06 NOTE — Patient Instructions (Addendum)

## 2017-04-06 NOTE — Therapy (Addendum)
[facility] [address]  [place], [name], [address] Phone: [phone]   Fax:  [phone]  Physical Therapy Treatment  Patient Details  [name] MRN: [id number] Date of Birth: August 18, 1955 Referring Provider: Roseanne Kaufman, MD  Encounter Date: 04/06/2017      PT End of Session - 04/06/17 1105    Visit Number 4   Number of Visits 12   Date for PT Re-Evaluation 05/02/17   Authorization Type [place] - PT only   Authorization - Number of Visits 60   PT Start Time 1100   PT Stop Time 5009   PT Time Calculation (min) 64 min   Activity Tolerance Patient tolerated treatment well   Behavior During Therapy WFL for tasks assessed/performed      Past Medical History:  Diagnosis Date  . Acute bronchitis   . Anemia    hx of anemia  . Anginal pain ([employer])   . Arthritis    osteo  . Coronary atherosclerosis   . Cough   . Hypertension   . Obstructive sleep apnea (adult) (pediatric)   . Type II or unspecified type diabetes mellitus without mention of complication, not stated as uncontrolled    type 2  . Unspecified hypothyroidism     Past Surgical History:  Procedure Laterality Date  . ANTERIOR FUSION CERVICAL SPINE    . LEFT HEART CATHETERIZATION WITH CORONARY ANGIOGRAM N/A 05/29/2014   Procedure: LEFT HEART CATHETERIZATION WITH CORONARY ANGIOGRAM;  Surgeon: Sinclair Grooms, MD;  Location: [facility] CATH LAB;  Service: Cardiovascular;  Laterality: N/A;  . TUBAL LIGATION    . WISDOM TOOTH EXTRACTION      There were no vitals filed for this visit.      Subjective Assessment - 04/06/17 1102    Subjective Pt arrives today stating "my shoulder is killing me today. 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   Pain Relieving Factors Voltaren gel   Effect of Pain on Daily Activities difficulty turning the bus wheel while driving                         [facility] Adult PT Treatment/Exercise - 04/06/17 1100      Exercises   Exercises Shoulder     Shoulder Exercises: Supine   Horizontal ABduction Both;15 reps;Theraband;Strengthening   Theraband Level (Shoulder Horizontal ABduction) Level 1 (Yellow)   Horizontal ABduction Limitations hooklying on pool noodle   External Rotation Both;10 reps;Theraband;Strengthening   Theraband Level (Shoulder External Rotation) Level 1 (Yellow)   External Rotation Limitations hooklying on pool noodle; c/o weak but no increased pain today   Other Supine Exercises alt shoulder flexion & opp shoulder extension with yellow TB x15 each way; hookying on pool noodle     Shoulder Exercises: Standing   Extension Both;15 reps;Theraband;Strengthening   Theraband Level (Shoulder Extension) Level 2 (Red)   Extension Limitations + scap retraction   Row Both;15 reps;Theraband;Strengthening   Theraband Level (Shoulder Row) Level 2 (Red)  Shoulder Exercises: ROM/Strengthening   UBE (Upper Arm Bike) lvl 2.0 fwd/back x 3" each   Cybex Row 15 reps   Cybex Row Limitations 15# - narrow grip   Other ROM/Strengthening Exercises BATCA pull-down 10# x15     Modalities   Modalities Electrical Stimulation;Moist Heat     Moist Heat Therapy   Number Minutes Moist Heat 15 Minutes   Moist Heat Location Shoulder     Electrical Stimulation   Electrical Stimulation Location R shoulder complex   Electrical Stimulation Action IFC   Electrical Stimulation Parameters 80-150 Hz, intensity to pt tol x15'   Electrical Stimulation Goals Pain;Tone     Manual Therapy   Manual Therapy Soft tissue mobilization;Myofascial  release   Manual therapy comments pt hooklying    Soft tissue mobilization R ant & mid deltoid, pecs, R posterior capsule, R biceps   Myofascial Release TPR to R pec major/minor & teres group          Trigger Point Dry Needling - 04/06/17 1100    Consent Given? Yes   Education Handout Provided Yes   Muscles Treated Upper Body --  deltoids - anterior & middle              PT Education - 04/06/17 1144    Education provided Yes   Education Details Role of DN, expected response and post-treatment recommendations; HEP addition - shoulder rows & scap retraction + extension with red TB   Person(s) Educated Patient   Methods Explanation;Demonstration;Handout   Comprehension Verbalized understanding;Returned demonstration;Need further instruction             PT Long Term Goals - 03/29/17 1105      PT LONG TERM GOAL #1   Title Pt reports able to perform ADLs, chores, and recreational activities without limitation by R shoulder or neck pain   Status On-going     PT LONG TERM GOAL #2   Title R Shoulder AROM WFL and MMT grossly 4+/5 or better   Status On-going     PT LONG TERM GOAL #3   Title Independent with ongoing HEP   Status On-going               Plan - 04/06/17 1105    Clinical Impression Statement Pt reporting worsening of pain this morning, presumably related to the way she slept last night as pain had been pretty well controlled until last night. New increased muscle tension and TPs noted in middle and anterior deltoid which reproduced her pain, therefore initiated trial of DN after pt education provided and consent received. Positive twitch response elicited with decreased muscle tension pain noted following treatment. Slight pain still noted with some exercises and continued cues necessary for neutral shoulder positioning during exericses. Treatment concluded with estim and moist heat to promote further muscle relaxation.   Clinical Impairments Affecting  Rehab Potential chronic neck & B shoulder pain with h/o ACDF; L carpal & cubital tunnel syndrome; lumbosacral DDD; lumbar stenosis with neurogenic claudication   PT Duration 6 weeks   PT Treatment/Interventions Patient/family education;Neuromuscular re-education;ADLs/Self Care Home Management;Manual techniques;Dry needling;Taping;Passive range of motion;Therapeutic exercise;Therapeutic activities;Electrical Stimulation;Moist Heat;Cryotherapy;Iontophoresis 4mg /ml Dexamethasone   PT Next Visit Plan assess response to DN & estim - f/u re: insurance coverage for home TENS/IT unit   Consulted and Agree with Plan of Care Patient      Patient will benefit from skilled therapeutic intervention in order to improve the following deficits and impairments:  Pain, Postural dysfunction, Decreased  range of motion, Decreased strength, Impaired UE functional use, Increased muscle spasms, Increased fascial restricitons, Decreased activity tolerance  Visit Diagnosis: Acute pain of right shoulder  Stiffness of right shoulder, not elsewhere classified  Abnormal posture  Muscle weakness (generalized)     Problem List Patient Active Problem List   Diagnosis Date Noted  . Acute bronchitis 08/10/2014  . Unstable angina ([place]) 05/29/2014  . Abnormal nuclear stress test 05/29/2014  . Chest pain 05/06/2014  . Exertional shortness of breath 05/06/2014  . Mixed hyperlipidemia 05/06/2014  . Hyperlipidemia with target LDL less than 70 08/05/2013  . HTN (hypertension) 08/05/2013  . Chest wall pain 08/05/2013  . SINUSITIS, ACUTE 04/07/2009  . HYPOTHYROIDISM 12/10/2007  . DIABETES MELLITUS, TYPE II 05/28/2007  . Obstructive sleep apnea 05/28/2007  . Coronary atherosclerosis 05/28/2007  . Seasonal and perennial allergic rhinitis 05/28/2007  . COUGH, CHRONIC 05/28/2007    Percival Spanish, PT, MPT 04/06/2017, 6:51 PM  [facility] [address]  [place], [name], [address] Phone: [phone]   Fax:  [phone]  Name: TWILA RAPPA MRN: [id number] Date of Birth: 07-15-1956

## 2017-04-12 ENCOUNTER — Ambulatory Visit: Payer: 59 | Attending: Orthopedic Surgery | Admitting: Physical Therapy

## 2017-04-12 DIAGNOSIS — R293 Abnormal posture: Secondary | ICD-10-CM | POA: Diagnosis present

## 2017-04-12 DIAGNOSIS — M25611 Stiffness of right shoulder, not elsewhere classified: Secondary | ICD-10-CM | POA: Diagnosis not present

## 2017-04-12 DIAGNOSIS — M25511 Pain in right shoulder: Secondary | ICD-10-CM

## 2017-04-12 DIAGNOSIS — M6281 Muscle weakness (generalized): Secondary | ICD-10-CM | POA: Insufficient documentation

## 2017-04-12 NOTE — Therapy (Signed)
[facility] [address]  [place], [name], [address] Phone: [phone]   Fax:  [phone]  Physical Therapy Treatment  Patient Details  [name] MRN: [id number] Date of Birth: [birth year] Referring Provider: Roseanne Kaufman, MD  Encounter Date: 04/12/2017      PT End of Session - 04/12/17 1105    Visit Number 5   Number of Visits 12   Date for PT Re-Evaluation 05/02/17   Authorization Type [place] - PT only   Authorization - Number of Visits 60   PT Start Time 1105   PT Stop Time 1205   PT Time Calculation (min) 60 min   Activity Tolerance Patient tolerated treatment well   Behavior During Therapy [facility] for tasks assessed/performed      Past Medical History:  Diagnosis Date  . Acute bronchitis   . Anemia    hx of anemia  . Anginal pain ([place])   . Arthritis    osteo  . Coronary atherosclerosis   . Cough   . Hypertension   . Obstructive sleep apnea (adult) (pediatric)   . Type II or unspecified type diabetes mellitus without mention of complication, not stated as uncontrolled    type 2  . Unspecified hypothyroidism     Past Surgical History:  Procedure Laterality Date  . ANTERIOR FUSION CERVICAL SPINE    . LEFT HEART CATHETERIZATION WITH CORONARY ANGIOGRAM N/A 05/29/2014   Procedure: LEFT HEART CATHETERIZATION WITH CORONARY ANGIOGRAM;  Surgeon: Sinclair Grooms, MD;  Location[facility] CATH LAB;  Service: Cardiovascular;  Laterality: N/A;  . TUBAL LIGATION    . WISDOM TOOTH EXTRACTION      There were no vitals filed for this visit.      Subjective Assessment - 04/12/17 1110    Subjective Pt reporting increased pain starting Thursday night and continuing through the weekend, with pain limiting strength in arm.   Patient Stated Goals "to get the pain level down & use the R arm more"   Currently in Pain? Yes   Pain Score 7    Pain Location Shoulder   Pain Orientation  Right;Lateral;Posterior   Pain Descriptors / Indicators Aching   Pain Type Acute pain   Pain Radiating Towards into R upper arm   Pain Frequency Constant                         [facility] Adult PT Treatment/Exercise - 04/12/17 1105      Shoulder Exercises: Standing   Extension Both;15 reps;Theraband;Strengthening   Theraband Level (Shoulder Extension) Level 2 (Red)   Extension Limitations + scap retraction   Row Both;15 reps;Theraband;Strengthening   Theraband Level (Shoulder Row) Level 2 (Red)     Shoulder Exercises: Pulleys   Flexion 2 minutes   ABduction 2 minutes   ABduction Limitations scaption/abduction     Shoulder Exercises: ROM/Strengthening   UBE (Upper Arm Bike) lvl 2.0 fwd/back x 3" each     Shoulder Exercises: Stretch   Internal Rotation Stretch 30 seconds  2 reps each   Internal Rotation Stretch Limitations R sidelying sleeper stretch & towel stretches     Modalities   Modalities Electrical Stimulation;Moist Heat     Moist Heat Therapy   Number Minutes Moist Heat 15 Minutes   Moist Heat Location Shoulder     Electrical Stimulation   Electrical Stimulation Location R shoulder complex   Electrical Stimulation Action IFC  Electrical Stimulation Parameters 80-150 Hz, intensity to pt tol x15'   Electrical Stimulation Goals Pain;Tone     Manual Therapy   Manual Therapy Soft tissue mobilization;Myofascial release;Passive ROM;Joint mobilization   Manual therapy comments pt hooklying    Joint Mobilization R shoulder - grade II-III inferior glides & A/P mobs   Soft tissue mobilization R ant & mid deltoid, pecs, R posterior capsule, R biceps   Myofascial Release TPR to R anterior deltoid   Passive ROM R shoulder MWM into flexion, abduction/scaption and IR                PT Education - 04/12/17 1146    Education provided Yes   Education Details HEP addtion - shoulder IR stretches   Person(s) Educated Patient   Methods  Explanation;Demonstration;Handout   Comprehension Verbalized understanding;Returned demonstration;Need further instruction             PT Long Term Goals - 03/29/17 1105      PT LONG TERM GOAL #1   Title Pt reports able to perform ADLs, chores, and recreational activities without limitation by R shoulder or neck pain   Status On-going     PT LONG TERM GOAL #2   Title R Shoulder AROM WFL and MMT grossly 4+/5 or better   Status On-going     PT LONG TERM GOAL #3   Title Independent with ongoing HEP   Status On-going               Plan - 04/12/17 1114    Clinical Impression Statement Pt reporting pain remained elevated over the weekend without noticeable relief from DN, but did note benefit from estim and moist heat. Reporting poor tolerance for shoulder rows and extension ov er weekend but able to continue with initial postural HEP. Continued manual focus to areas of increased tension but deferred DN today. R shoulder PROM near full for overhead motions and ER but still limited with IR, therefore added IR strectches to HEP.   Rehab Potential Good   Clinical Impairments Affecting Rehab Potential chronic neck & B shoulder pain with h/o ACDF; L carpal & cubital tunnel syndrome; lumbosacral DDD; lumbar stenosis with neurogenic claudication   PT Duration --   PT Treatment/Interventions Patient/family education;Neuromuscular re-education;ADLs/Self Care Home Management;Manual techniques;Dry needling;Taping;Passive range of motion;Therapeutic exercise;Therapeutic activities;Electrical Stimulation;Moist Heat;Cryotherapy;Iontophoresis 4mg /ml Dexamethasone   PT Next Visit Plan f/u re: insurance coverage for home TENS/IT unit   Consulted and Agree with Plan of Care Patient      Patient will benefit from skilled therapeutic intervention in order to improve the following deficits and impairments:  Pain, Postural dysfunction, Decreased range of motion, Decreased strength, Impaired UE  functional use, Increased muscle spasms, Increased fascial restricitons, Decreased activity tolerance  Visit Diagnosis: Acute pain of right shoulder  Stiffness of right shoulder, not elsewhere classified  Abnormal posture  Muscle weakness (generalized)     Problem List Patient Active Problem List   Diagnosis Date Noted  . Acute bronchitis 08/10/2014  . Unstable angina ([place]) 05/29/2014  . Abnormal nuclear stress test 05/29/2014  . Chest pain 05/06/2014  . Exertional shortness of breath 05/06/2014  . Mixed hyperlipidemia 05/06/2014  . Hyperlipidemia with target LDL less than 70 08/05/2013  . HTN (hypertension) 08/05/2013  . Chest wall pain 08/05/2013  . SINUSITIS, ACUTE 04/07/2009  . HYPOTHYROIDISM 12/10/2007  . DIABETES MELLITUS, TYPE II 05/28/2007  . Obstructive sleep apnea 05/28/2007  . Coronary atherosclerosis 05/28/2007  . Seasonal and perennial allergic rhinitis  05/28/2007  . COUGH, CHRONIC 05/28/2007    Percival Spanish, PT, MPT 04/12/2017, 12:22 PM  [facility] [address]  [place], [name], [address] Phone: [phone]   Fax:  [phone]  [name] MRN: [id number] Date of Birth: 06/22/1956

## 2017-04-14 ENCOUNTER — Ambulatory Visit: Payer: 59 | Admitting: Physical Therapy

## 2017-04-14 DIAGNOSIS — M6281 Muscle weakness (generalized): Secondary | ICD-10-CM

## 2017-04-14 DIAGNOSIS — R293 Abnormal posture: Secondary | ICD-10-CM

## 2017-04-14 DIAGNOSIS — M25611 Stiffness of right shoulder, not elsewhere classified: Secondary | ICD-10-CM

## 2017-04-14 DIAGNOSIS — M25511 Pain in right shoulder: Secondary | ICD-10-CM | POA: Diagnosis not present

## 2017-04-14 NOTE — Therapy (Signed)
[facility] [address]  [place], [name], [address] Phone: [phone]   Fax:  [phone]  Physical Therapy Treatment  Patient Details  [name] MRN: [id number] Date of Birth: 1956/06/24 Referring Provider: Roseanne Kaufman, MD  Encounter Date: 04/14/2017      PT End of Session - 04/14/17 1059    Visit Number 6   Number of Visits 12   Date for PT Re-Evaluation 05/02/17   Authorization Type [place] - PT only   Authorization - Number of Visits 60   PT Start Time 1059   PT Stop Time 1200   PT Time Calculation (min) 61 min   Activity Tolerance Patient tolerated treatment well   Behavior During Therapy [facility] for tasks assessed/performed      Past Medical History:  Diagnosis Date  . Acute bronchitis   . Anemia    hx of anemia  . Anginal pain ([place])   . Arthritis    osteo  . Coronary atherosclerosis   . Cough   . Hypertension   . Obstructive sleep apnea (adult) (pediatric)   . Type II or unspecified type diabetes mellitus without mention of complication, not stated as uncontrolled    type 2  . Unspecified hypothyroidism     Past Surgical History:  Procedure Laterality Date  . ANTERIOR FUSION CERVICAL SPINE    . LEFT HEART CATHETERIZATION WITH CORONARY ANGIOGRAM N/A 05/29/2014   Procedure: LEFT HEART CATHETERIZATION WITH CORONARY ANGIOGRAM;  Surgeon: Sinclair Grooms, MD;  Location: [facility] CATH LAB;  Service: Cardiovascular;  Laterality: N/A;  . TUBAL LIGATION    . WISDOM TOOTH EXTRACTION      There were no vitals filed for this visit.      Subjective Assessment - 04/14/17 1101    Subjective Pt "doing so much better today."   Patient Stated Goals "to get the pain level down & use the R arm more"   Currently in Pain? Yes   Pain Score 2    Pain Location Shoulder   Pain Orientation Right   Pain Descriptors / Indicators Discomfort  "misery"   Pain Type Acute pain   Pain Frequency  Intermittent                         [facility] Adult PT Treatment/Exercise - 04/14/17 1059      Exercises   Exercises Shoulder     Shoulder Exercises: Sidelying   External Rotation Right;10 reps   External Rotation Limitations VC & TC for shoulder depression & scap retraction - limited ROM against gravity     Shoulder Exercises: ROM/Strengthening   UBE (Upper Arm Bike) lvl 2.5 fwd/back x 3" each   Wall Wash R shoulder flexion, abduction & overhead arc with washcloth on wall x10, 3-5" hold for slight stretch at top of flexion & abduction ROM     Shoulder Exercises: Isometric Strengthening   External Rotation 5X5"   External Rotation Limitations standing at doorframe   Internal Rotation 5X5"   Internal Rotation Limitations standing at doorframe     Shoulder Exercises: Stretch   Other Shoulder Stretches R ant deltoid stretch - supine with R shoulder extended over edge of mat table 2x30"   Other Shoulder Stretches R open book stretch attempted but deferred d/t increased pain     Modalities   Modalities Electrical Stimulation;Moist Heat     Moist Heat Therapy  Number Minutes Moist Heat 15 Minutes   Moist Heat Location Shoulder     Electrical Stimulation   Electrical Stimulation Location R shoulder complex   Electrical Stimulation Action IFC   Electrical Stimulation Parameters 80-150 Hz, intensity to pt tol x15'   Electrical Stimulation Goals Pain;Tone     Manual Therapy   Manual Therapy Soft tissue mobilization;Myofascial release;Passive ROM;Joint mobilization;Muscle Energy Technique   Manual therapy comments pt hooklying    Joint Mobilization R shoulder - grade II-III inferior glides & A/P mobs   Soft tissue mobilization R ant & mid deltoid, pecs, R posterior capsule, R biceps   Myofascial Release TPR to R anterior deltoid   Passive ROM R shoulder MWM into flexion, abduction/scaption and IR   Muscle Energy Technique contract/relax stretch into R shoulder IR                      PT Long Term Goals - 03/29/17 1105      PT LONG TERM GOAL #1   Title Pt reports able to perform ADLs, chores, and recreational activities without limitation by R shoulder or neck pain   Status On-going     PT LONG TERM GOAL #2   Title R Shoulder AROM WFL and MMT grossly 4+/5 or better   Status On-going     PT LONG TERM GOAL #3   Title Independent with ongoing HEP   Status On-going               Plan - 04/14/17 1102    Clinical Impression Statement Pt reporting considerable relief of pain since last visit with only mild "misery" (discomfort) noted today. Significant improvement noted in R shoulder overhead ROM & ER, with nearly full flexion ROM restored and major gains in abduction ROM. ER ROM nearly symmetrical to L in stting but limited AROM against gravity in sidelying. Initiated isometric strengthening for IR/ER with repeated cues necessaryt to avoid hiking shoulder during these and most other R shoulder exercises. Continued manual therapy to address increased muscle tension with increased ROM noted in IR following this. Treatment concluded with esitm and moist heat as continued benefit noted. Awaiting insurance coverage for home TENS/IT unit.   Rehab Potential Good   Clinical Impairments Affecting Rehab Potential chronic neck & B shoulder pain with h/o ACDF; L carpal & cubital tunnel syndrome; lumbosacral DDD; lumbar stenosis with neurogenic claudication   PT Treatment/Interventions Patient/family education;Neuromuscular re-education;ADLs/Self Care Home Management;Manual techniques;Dry needling;Taping;Passive range of motion;Therapeutic exercise;Therapeutic activities;Electrical Stimulation;Moist Heat;Cryotherapy;Iontophoresis 4mg /ml Dexamethasone   PT Next Visit Plan f/u re: insurance coverage for home TENS/IT unit   Consulted and Agree with Plan of Care Patient      Patient will benefit from skilled therapeutic intervention in order to improve  the following deficits and impairments:  Pain, Postural dysfunction, Decreased range of motion, Decreased strength, Impaired UE functional use, Increased muscle spasms, Increased fascial restricitons, Decreased activity tolerance  Visit Diagnosis: Acute pain of right shoulder  Stiffness of right shoulder, not elsewhere classified  Abnormal posture  Muscle weakness (generalized)     Problem List Patient Active Problem List   Diagnosis Date Noted  . Acute bronchitis 08/10/2014  . Unstable angina ([place]) 05/29/2014  . Abnormal nuclear stress test 05/29/2014  . Chest pain 05/06/2014  . Exertional shortness of breath 05/06/2014  . Mixed hyperlipidemia 05/06/2014  . Hyperlipidemia with target LDL less than 70 08/05/2013  . HTN (hypertension) 08/05/2013  . Chest wall pain 08/05/2013  . SINUSITIS,  ACUTE 04/07/2009  . HYPOTHYROIDISM 12/10/2007  . DIABETES MELLITUS, TYPE II 05/28/2007  . Obstructive sleep apnea 05/28/2007  . Coronary atherosclerosis 05/28/2007  . Seasonal and perennial allergic rhinitis 05/28/2007  . COUGH, CHRONIC 05/28/2007    Percival Spanish, PT, MPT 04/14/2017, 11:54 AM  [facility] [address]  [place], [name], [address] Phone: [phone]   Fax:  [phone]  [name] MRN: [id number] Date of Birth: 1955/11/12

## 2017-04-17 ENCOUNTER — Ambulatory Visit: Payer: 59 | Admitting: Physical Therapy

## 2017-04-17 DIAGNOSIS — M6281 Muscle weakness (generalized): Secondary | ICD-10-CM

## 2017-04-17 DIAGNOSIS — M25511 Pain in right shoulder: Secondary | ICD-10-CM

## 2017-04-17 DIAGNOSIS — R293 Abnormal posture: Secondary | ICD-10-CM

## 2017-04-17 DIAGNOSIS — M25611 Stiffness of right shoulder, not elsewhere classified: Secondary | ICD-10-CM

## 2017-04-17 NOTE — Therapy (Signed)
[facility] [address]  [place], [name], [address] Phone: [phone]   Fax:  [phone]  Physical Therapy Treatment  Patient Details  [name] MRN: [id number] Date of Birth: [birth year] Referring Provider: Roseanne Kaufman, MD  Encounter Date: 04/17/2017      PT End of Session - 04/17/17 1101    Visit Number 7   Number of Visits 12   Date for PT Re-Evaluation 05/02/17   Authorization Type [place] - PT only   Authorization - Number of Visits 60   PT Start Time 1101   PT Stop Time 1212   PT Time Calculation (min) 71 min   Activity Tolerance Patient tolerated treatment well   Behavior During Therapy [facility] for tasks assessed/performed      Past Medical History:  Diagnosis Date  . Acute bronchitis   . Anemia    hx of anemia  . Anginal pain ([place])   . Arthritis    osteo  . Coronary atherosclerosis   . Cough   . Hypertension   . Obstructive sleep apnea (adult) (pediatric)   . Type II or unspecified type diabetes mellitus without mention of complication, not stated as uncontrolled    type 2  . Unspecified hypothyroidism     Past Surgical History:  Procedure Laterality Date  . ANTERIOR FUSION CERVICAL SPINE    . LEFT HEART CATHETERIZATION WITH CORONARY ANGIOGRAM N/A 05/29/2014   Procedure: LEFT HEART CATHETERIZATION WITH CORONARY ANGIOGRAM;  Surgeon: Sinclair Grooms, MD;  Location: [facility] CATH LAB;  Service: Cardiovascular;  Laterality: N/A;  . TUBAL LIGATION    . WISDOM TOOTH EXTRACTION      There were no vitals filed for this visit.      Subjective Assessment - 04/17/17 1104    Subjective Pt reports no issues on Friday & Saturday, but states her shoulder was acting up yesterday, noting a repeated "catching" sensation. No issues again today other than very mild pain.   Patient Stated Goals "to get the pain level down & use the R arm more"   Currently in Pain? Yes   Pain Score 2    Pain Location Shoulder   Pain Orientation Right   Pain Descriptors / Indicators Discomfort   Pain Type Acute pain                         [facility] Adult PT Treatment/Exercise - 04/17/17 1101      Exercises   Exercises Shoulder     Shoulder Exercises: Supine   Protraction Right;10 reps;Weights;Strengthening   Protraction Weight (lbs) 2   Flexion Right;10 reps;Weights;Strengthening   Shoulder Flexion Weight (lbs) 2   Flexion Limitations 45 dg to full flexion (lower range avoided d/t catching upon lowering)   Other Supine Exercises R shoulder circles CW/CCW at 90 dg flexion x10 each way     Shoulder Exercises: Therapy Ball   Flexion 10 reps   Flexion Limitations orange ball on wall + slight lift off at top of motion   ABduction Limitations deferred d/t "catch" when rolling ball back down from abduction/scaption     Shoulder Exercises: ROM/Strengthening   UBE (Upper Arm Bike) lvl 2.5 fwd/back x 3" each     Shoulder Exercises: Isometric Strengthening   External Rotation Theraband   Theraband Level (External Rotation) Level 1 (Yellow)   External Rotation Limitations 10x5" - w/in pain-free tension on TB  Internal Rotation Theraband   Theraband Level (Internal Rotation) Level 1 (Yellow)   Internal Rotation Limitations 10x5"     Modalities   Modalities Electrical Stimulation;Moist Heat     Moist Heat Therapy   Number Minutes Moist Heat 15 Minutes   Moist Heat Location Shoulder     Electrical Stimulation   Electrical Stimulation Location R shoulder complex   Electrical Stimulation Action IFC   Electrical Stimulation Parameters 80-150 Hz, intensity to pt tol x15'   Electrical Stimulation Goals Pain;Tone     Manual Therapy   Manual Therapy Soft tissue mobilization;Myofascial release;Passive ROM;Joint mobilization;Muscle Energy Technique;Taping   Manual therapy comments pt hooklying    Joint Mobilization R shoulder - grade II-III inferior glides & A/P mobs   Soft  tissue mobilization R ant & mid deltoid, pecs, R posterior capsule, R biceps   Myofascial Release TPR to R anterior deltoid   Passive ROM R shoulder MWM into flexion, abduction/scaption and IR   Muscle Energy Technique contract/relax stretch into R shoulder IR   Kinesiotex Create Space;Inhibit Muscle     Kinesiotix   Create Space Rt RTC impingment pattern   Inhibit Muscle  Rt deltoid inhibition pattern                     PT Long Term Goals - 03/29/17 1105      PT LONG TERM GOAL #1   Title Pt reports able to perform ADLs, chores, and recreational activities without limitation by R shoulder or neck pain   Status On-going     PT LONG TERM GOAL #2   Title R Shoulder AROM WFL and MMT grossly 4+/5 or better   Status On-going     PT LONG TERM GOAL #3   Title Independent with ongoing HEP   Status On-going               Plan - 04/17/17 1107    Clinical Impression Statement Pt reporting overall a good weekend but did note increased pain/"catching" yesterday with some movements inlcuding arm swing with walking. Current pain reports reman consistent with RTC impingment and pt continues to demonstrate tendency for protracted elevated shoulder, therefore intiated trial of taping for RTC impingment and will assess response at next visit.   Rehab Potential Good   Clinical Impairments Affecting Rehab Potential chronic neck & B shoulder pain with h/o ACDF; L carpal & cubital tunnel syndrome; lumbosacral DDD; lumbar stenosis with neurogenic claudication   PT Treatment/Interventions Patient/family education;Neuromuscular re-education;ADLs/Self Care Home Management;Manual techniques;Dry needling;Taping;Passive range of motion;Therapeutic exercise;Therapeutic activities;Electrical Stimulation;Moist Heat;Cryotherapy;Iontophoresis 4mg /ml Dexamethasone   PT Next Visit Plan assess response to taping; f/u re: insurance coverage for home TENS/IT unit   Consulted and Agree with Plan of Care  Patient      Patient will benefit from skilled therapeutic intervention in order to improve the following deficits and impairments:  Pain, Postural dysfunction, Decreased range of motion, Decreased strength, Impaired UE functional use, Increased muscle spasms, Increased fascial restricitons, Decreased activity tolerance  Visit Diagnosis: Acute pain of right shoulder  Stiffness of right shoulder, not elsewhere classified  Abnormal posture  Muscle weakness (generalized)     Problem List Patient Active Problem List   Diagnosis Date Noted  . Acute bronchitis 08/10/2014  . Unstable angina ([place]) 05/29/2014  . Abnormal nuclear stress test 05/29/2014  . Chest pain 05/06/2014  . Exertional shortness of breath 05/06/2014  . Mixed hyperlipidemia 05/06/2014  . Hyperlipidemia with target LDL less than 70  08/05/2013  . HTN (hypertension) 08/05/2013  . Chest wall pain 08/05/2013  . SINUSITIS, ACUTE 04/07/2009  . HYPOTHYROIDISM 12/10/2007  . DIABETES MELLITUS, TYPE II 05/28/2007  . Obstructive sleep apnea 05/28/2007  . Coronary atherosclerosis 05/28/2007  . Seasonal and perennial allergic rhinitis 05/28/2007  . COUGH, CHRONIC 05/28/2007    Percival Spanish, PT, MPT 04/17/2017, 12:20 PM  [facility] [address]  [place], [name], [address] Phone: [phone]   Fax:  [phone]  [name] MRN: [id number] Date of Birth: [birth year]

## 2017-04-20 ENCOUNTER — Ambulatory Visit: Payer: 59 | Admitting: Physical Therapy

## 2017-04-20 DIAGNOSIS — M25611 Stiffness of right shoulder, not elsewhere classified: Secondary | ICD-10-CM

## 2017-04-20 DIAGNOSIS — M25511 Pain in right shoulder: Secondary | ICD-10-CM | POA: Diagnosis not present

## 2017-04-20 DIAGNOSIS — M6281 Muscle weakness (generalized): Secondary | ICD-10-CM

## 2017-04-20 DIAGNOSIS — R293 Abnormal posture: Secondary | ICD-10-CM

## 2017-04-20 NOTE — Therapy (Signed)
[facility] [address]  [place], [name], [address] Phone: [phone]   Fax:  [phone]  Physical Therapy Treatment  Patient Details  [name] MRN: [id number] Date of Birth: 1956-08-07 Referring Provider: Roseanne Kaufman, MD  Encounter Date: 04/20/2017      PT End of Session - 04/20/17 1057    Visit Number 8   Number of Visits 12   Date for PT Re-Evaluation 05/02/17   Authorization Type [place] - PT only   Authorization - Number of Visits 60   PT Start Time 1057   PT Stop Time 1130  pt needing to leave early to return to work   PT Time Calculation (min) 33 min   Activity Tolerance Patient tolerated treatment well   Behavior During Therapy [facility] for tasks assessed/performed      Past Medical History:  Diagnosis Date  . Acute bronchitis   . Anemia    hx of anemia  . Anginal pain ([place])   . Arthritis    osteo  . Coronary atherosclerosis   . Cough   . Hypertension   . Obstructive sleep apnea (adult) (pediatric)   . Type II or unspecified type diabetes mellitus without mention of complication, not stated as uncontrolled    type 2  . Unspecified hypothyroidism     Past Surgical History:  Procedure Laterality Date  . ANTERIOR FUSION CERVICAL SPINE    . LEFT HEART CATHETERIZATION WITH CORONARY ANGIOGRAM N/A 05/29/2014   Procedure: LEFT HEART CATHETERIZATION WITH CORONARY ANGIOGRAM;  Surgeon: Sinclair Grooms, MD;  Location: [facility] CATH LAB;  Service: Cardiovascular;  Laterality: N/A;  . TUBAL LIGATION    . WISDOM TOOTH EXTRACTION      There were no vitals filed for this visit.      Subjective Assessment - 04/20/17 1108    Subjective (P)  Pt feels like taping helped, but reports flare-up yesterday w/o known trigger.   Patient Stated Goals (P)  "to get the pain level down & use the R arm more"   Currently in Pain? (P)  Yes   Pain Score (P)  6    Pain Location (P)  Shoulder   Pain  Orientation (P)  Right   Pain Descriptors / Indicators (P)  Aching;Tiring;Throbbing   Pain Type (P)  Acute pain                         [facility] Adult PT Treatment/Exercise - 04/20/17 1057      Shoulder Exercises: ROM/Strengthening   UBE (Upper Arm Bike) lvl 2.5 fwd/back x 3" each                     PT Long Term Goals - 03/29/17 1105      PT LONG TERM GOAL #1   Title Pt reports able to perform ADLs, chores, and recreational activities without limitation by R shoulder or neck pain   Status On-going     PT LONG TERM GOAL #2   Title R Shoulder AROM WFL and MMT grossly 4+/5 or better   Status On-going     PT LONG TERM GOAL #3   Title Independent with ongoing HEP   Status On-going               Plan - 04/20/17 1134    Clinical Impression Statement Pt reporting benefit from taping last visit with tape  still in place today, however did report another flare-up of her pain yesterday pm w/o indentifiable triggering event. States she is trying to be more conscious of her posture and but still demonstrating tendency for R shoulder hike resulting in increased pain and muscle tension in R UT and deltoids. Treatment time limited today due to pt needing to leave early to return to work, therefore focused on manual therapy to address increased muscle tension with pain reduced to 2/10 by end of session.   Rehab Potential Good   Clinical Impairments Affecting Rehab Potential chronic neck & B shoulder pain with h/o ACDF; L carpal & cubital tunnel syndrome; lumbosacral DDD; lumbar stenosis with neurogenic claudication   PT Treatment/Interventions Patient/family education;Neuromuscular re-education;ADLs/Self Care Home Management;Manual techniques;Dry needling;Taping;Passive range of motion;Therapeutic exercise;Therapeutic activities;Electrical Stimulation;Moist Heat;Cryotherapy;Iontophoresis 4mg /ml Dexamethasone   PT Next Visit Plan f/u re: insurance coverage for home  TENS/IT unit   Consulted and Agree with Plan of Care Patient      Patient will benefit from skilled therapeutic intervention in order to improve the following deficits and impairments:  Pain, Postural dysfunction, Decreased range of motion, Decreased strength, Impaired UE functional use, Increased muscle spasms, Increased fascial restricitons, Decreased activity tolerance  Visit Diagnosis: Acute pain of right shoulder  Stiffness of right shoulder, not elsewhere classified  Abnormal posture  Muscle weakness (generalized)     Problem List Patient Active Problem List   Diagnosis Date Noted  . Acute bronchitis 08/10/2014  . Unstable angina ([place]) 05/29/2014  . Abnormal nuclear stress test 05/29/2014  . Chest pain 05/06/2014  . Exertional shortness of breath 05/06/2014  . Mixed hyperlipidemia 05/06/2014  . Hyperlipidemia with target LDL less than 70 08/05/2013  . HTN (hypertension) 08/05/2013  . Chest wall pain 08/05/2013  . SINUSITIS, ACUTE 04/07/2009  . HYPOTHYROIDISM 12/10/2007  . DIABETES MELLITUS, TYPE II 05/28/2007  . Obstructive sleep apnea 05/28/2007  . Coronary atherosclerosis 05/28/2007  . Seasonal and perennial allergic rhinitis 05/28/2007  . COUGH, CHRONIC 05/28/2007    Percival Spanish, PT, MPT 04/20/2017, 11:45 AM  [facility] [address] [facility] [address]  [place], [name], [address] Phone: [phone]   Fax:  [phone]  [name] MRN: [id number] Date of Birth: [birth year]

## 2017-04-24 ENCOUNTER — Ambulatory Visit: Payer: 59 | Admitting: Physical Therapy

## 2017-04-24 DIAGNOSIS — M25611 Stiffness of right shoulder, not elsewhere classified: Secondary | ICD-10-CM

## 2017-04-24 DIAGNOSIS — R293 Abnormal posture: Secondary | ICD-10-CM

## 2017-04-24 DIAGNOSIS — M25551 Pain in right hip: Secondary | ICD-10-CM | POA: Diagnosis not present

## 2017-04-24 DIAGNOSIS — M25511 Pain in right shoulder: Secondary | ICD-10-CM

## 2017-04-24 DIAGNOSIS — M6281 Muscle weakness (generalized): Secondary | ICD-10-CM

## 2017-04-24 DIAGNOSIS — M545 Low back pain: Secondary | ICD-10-CM | POA: Diagnosis not present

## 2017-04-24 DIAGNOSIS — Z23 Encounter for immunization: Secondary | ICD-10-CM | POA: Diagnosis not present

## 2017-04-24 NOTE — Therapy (Signed)
[facility] [address]  [place], [name], [address] Phone: [phone]   Fax:  [phone]  Physical Therapy Treatment  Patient Details  [name] MRN: [id number] Date of Birth: 1955/12/24 Referring Provider: Roseanne Kaufman, MD  Encounter Date: 04/24/2017      PT End of Session - 04/24/17 1104    Visit Number 9   Number of Visits 12   Date for PT Re-Evaluation 05/02/17   Authorization Type [place] - PT only   Authorization - Number of Visits 60   PT Start Time 1104   PT Stop Time 1149   PT Time Calculation (min) 45 min   Activity Tolerance Patient tolerated treatment well   Behavior During Therapy [facility] for tasks assessed/performed      Past Medical History:  Diagnosis Date  . Acute bronchitis   . Anemia    hx of anemia  . Anginal pain ([place])   . Arthritis    osteo  . Coronary atherosclerosis   . Cough   . Hypertension   . Obstructive sleep apnea (adult) (pediatric)   . Type II or unspecified type diabetes mellitus without mention of complication, not stated as uncontrolled    type 2  . Unspecified hypothyroidism     Past Surgical History:  Procedure Laterality Date  . ANTERIOR FUSION CERVICAL SPINE    . LEFT HEART CATHETERIZATION WITH CORONARY ANGIOGRAM N/A 05/29/2014   Procedure: LEFT HEART CATHETERIZATION WITH CORONARY ANGIOGRAM;  Surgeon: Sinclair Grooms, MD;  Location: [facility] CATH LAB;  Service: Cardiovascular;  Laterality: N/A;  . TUBAL LIGATION    . WISDOM TOOTH EXTRACTION      There were no vitals filed for this visit.      Subjective Assessment - 04/24/17 1108    Subjective Pt reports shoulder remained flared-up through the weekend, causing her to take pain meds which did not help much, but states better this morning.   Patient Stated Goals "to get the pain level down & use the R arm more"   Currently in Pain? Yes   Pain Score 2    Pain Location Shoulder   Pain  Orientation Right   Pain Descriptors / Indicators Tiring;Aching   Pain Type Acute pain   Pain Radiating Towards into R upper arm   Pain Frequency Intermittent            [facility] PT Assessment - 04/24/17 1104      Assessment   Medical Diagnosis R shoulder pain   Referring Provider Roseanne Kaufman, MD   Next MD Visit ~05/25/17                     [facility] Adult PT Treatment/Exercise - 04/24/17 1104      Exercises   Exercises Shoulder     Shoulder Exercises: Prone   Extension Both;Left;10 reps  2 sets   Extension Limitations I's prone over green Pball; L UE over edge of mat table   Horizontal ABduction 1 Both;Left;10 reps  2 sets   Horizontal ABduction 1 Limitations T's prone over green Pball; L UE over edge of mat table     Shoulder Exercises: Standing   Horizontal ABduction Limitations atttempted with red & yellow TB while standing with back along pool noodle on wall - deferred d/t increased pain on eccentric release   Theraband Level (Shoulder External Rotation) Level 1 (Yellow)   External Rotation Limitations deferred d/t increased  pain   Internal Rotation Right;15 reps;Theraband   Theraband Level (Shoulder Internal Rotation) Level 1 (Yellow)   Extension Both;15 reps;Theraband;Strengthening   Theraband Level (Shoulder Extension) Level 3 (Green)   Extension Limitations + scap retraction   Row Both;15 reps;Theraband;Strengthening   Theraband Level (Shoulder Row) Level 3 (Green)   Other Standing Exercises R shoulder cabinet reach to 1st & 2nd shelves 1# x10     Shoulder Exercises: ROM/Strengthening   UBE (Upper Arm Bike) lvl 3.0 fwd/back x 3" each     Shoulder Exercises: Isometric Strengthening   External Rotation Theraband   Theraband Level (External Rotation) Level 1 (Yellow)   External Rotation Limitations 10x5" - w/in pain-free tension on TB                     PT Long Term Goals - 03/29/17 1105      PT LONG TERM GOAL #1   Title Pt  reports able to perform ADLs, chores, and recreational activities without limitation by R shoulder or neck pain   Status On-going     PT LONG TERM GOAL #2   Title R Shoulder AROM WFL and MMT grossly 4+/5 or better   Status On-going     PT LONG TERM GOAL #3   Title Independent with ongoing HEP   Status On-going               Plan - 04/24/17 1110    Clinical Impression Statement Pt reporting R shoulder pain remained elevated over the weekend, but woke up today with less pain. Still notes limitation with lifting objects with R UE as well as reaching out to side behind her. Continued emphasis on scapular strengthening and scapular activation during RTC strengthening with pain limiting some exercise attempts but no increased pain reported by end of session.   Rehab Potential Good   Clinical Impairments Affecting Rehab Potential chronic neck & B shoulder pain with h/o ACDF; L carpal & cubital tunnel syndrome; lumbosacral DDD; lumbar stenosis with neurogenic claudication   PT Treatment/Interventions Patient/family education;Neuromuscular re-education;ADLs/Self Care Home Management;Manual techniques;Dry needling;Taping;Passive range of motion;Therapeutic exercise;Therapeutic activities;Electrical Stimulation;Moist Heat;Cryotherapy;Iontophoresis 4mg /ml Dexamethasone   PT Next Visit Plan 10th visit FOTO, G-code & MD note; f/u re: insurance coverage for home TENS/IT unit   Consulted and Agree with Plan of Care Patient      Patient will benefit from skilled therapeutic intervention in order to improve the following deficits and impairments:  Pain, Postural dysfunction, Decreased range of motion, Decreased strength, Impaired UE functional use, Increased muscle spasms, Increased fascial restricitons, Decreased activity tolerance  Visit Diagnosis: Acute pain of right shoulder  Stiffness of right shoulder, not elsewhere classified  Abnormal posture  Muscle weakness  (generalized)     Problem List Patient Active Problem List   Diagnosis Date Noted  . Acute bronchitis 08/10/2014  . Unstable angina ([place]) 05/29/2014  . Abnormal nuclear stress test 05/29/2014  . Chest pain 05/06/2014  . Exertional shortness of breath 05/06/2014  . Mixed hyperlipidemia 05/06/2014  . Hyperlipidemia with target LDL less than 70 08/05/2013  . HTN (hypertension) 08/05/2013  . Chest wall pain 08/05/2013  . SINUSITIS, ACUTE 04/07/2009  . HYPOTHYROIDISM 12/10/2007  . DIABETES MELLITUS, TYPE II 05/28/2007  . Obstructive sleep apnea 05/28/2007  . Coronary atherosclerosis 05/28/2007  . Seasonal and perennial allergic rhinitis 05/28/2007  . COUGH, CHRONIC 05/28/2007    Percival Spanish, PT, MPT 04/24/2017, 11:56 AM  [facility] [phone]  [address]  [place], [name], [address] Phone: [phone]   Fax:  [phone]  [name] MRN: [id number] Date of Birth: 1955-12-05

## 2017-04-27 ENCOUNTER — Ambulatory Visit: Payer: 59 | Admitting: Physical Therapy

## 2017-04-27 DIAGNOSIS — M25611 Stiffness of right shoulder, not elsewhere classified: Secondary | ICD-10-CM

## 2017-04-27 DIAGNOSIS — M6281 Muscle weakness (generalized): Secondary | ICD-10-CM

## 2017-04-27 DIAGNOSIS — M25511 Pain in right shoulder: Secondary | ICD-10-CM

## 2017-04-27 DIAGNOSIS — R293 Abnormal posture: Secondary | ICD-10-CM

## 2017-04-27 NOTE — Therapy (Signed)
[facility] [address]  [place], [name], [address] Phone: [phone]   Fax:  [phone]  Physical Therapy Treatment  Patient Details  [name] MRN: [id number] Date of Birth: November 14, 1955 Referring Provider: Roseanne Kaufman, MD  Encounter Date: 04/27/2017      PT End of Session - 04/27/17 1106    Visit Number 10   Number of Visits 12   Date for PT Re-Evaluation 05/02/17   Authorization Type [place] - PT only   Authorization - Number of Visits 60   PT Start Time 1106   PT Stop Time 1150   PT Time Calculation (min) 44 min   Activity Tolerance Patient tolerated treatment well   Behavior During Therapy [facility] for tasks assessed/performed      Past Medical History:  Diagnosis Date  . Acute bronchitis   . Anemia    hx of anemia  . Anginal pain ([place])   . Arthritis    osteo  . Coronary atherosclerosis   . Cough   . Hypertension   . Obstructive sleep apnea (adult) (pediatric)   . Type II or unspecified type diabetes mellitus without mention of complication, not stated as uncontrolled    type 2  . Unspecified hypothyroidism     Past Surgical History:  Procedure Laterality Date  . ANTERIOR FUSION CERVICAL SPINE    . LEFT HEART CATHETERIZATION WITH CORONARY ANGIOGRAM N/A 05/29/2014   Procedure: LEFT HEART CATHETERIZATION WITH CORONARY ANGIOGRAM;  Surgeon: Sinclair Grooms, MD;  Location: [facility] CATH LAB;  Service: Cardiovascular;  Laterality: N/A;  . TUBAL LIGATION    . WISDOM TOOTH EXTRACTION      There were no vitals filed for this visit.      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   Patient Stated Goals "to get the pain level down & use the R arm more"   Currently in Pain? No/denies   Pain Score 0-No pain            [facility] PT Assessment - 04/27/17 1106      Assessment   Medical Diagnosis R  shoulder pain   Referring Provider Roseanne Kaufman, MD   Onset Date/Surgical Date 01/06/17   Hand Dominance Right   Next MD Visit ~05/25/17     Observation/Other Assessments   Focus on Therapeutic Outcomes (FOTO)  Shoulder - 65% (35% limitation)     AROM   Right Shoulder Flexion 162 Degrees   Right Shoulder ABduction 149 Degrees   Right Shoulder Internal Rotation --  FER to L1   Right Shoulder External Rotation --  FER to C5   Left Shoulder Flexion 167 Degrees   Left Shoulder ABduction 152 Degrees   Left Shoulder Internal Rotation --  FER to T11   Left Shoulder External Rotation --  FER to C7   Cervical Flexion 28   Cervical Extension 44   Cervical - Right Side Bend 24   Cervical - Left Side Bend 26   Cervical - Right Rotation 60   Cervical - Left Rotation 54     Strength   Right Shoulder Flexion 4-/5  pain with resistance   Right Shoulder ABduction 4/5   Right Shoulder Internal Rotation 4/5   Right Shoulder External Rotation 3-/5   Left Shoulder Flexion 4/5   Left Shoulder ABduction 4+/5   Left Shoulder  Internal Rotation 4+/5   Left Shoulder External Rotation 4-/5                     [facility] Adult PT Treatment/Exercise - 2017/05/27 1106      Exercises   Exercises Shoulder     Shoulder Exercises: Supine   Protraction Right;10 reps;Weights;Strengthening   Protraction Weight (lbs) 3   Horizontal ABduction Both;15 reps;Theraband;Strengthening   Theraband Level (Shoulder Horizontal ABduction) Level 2 (Red)   Other Supine Exercises R shoulder circles CW/CCW at 90 dg flexion 2# x10 each way   Other Supine Exercises hooklying alt shoulder flexion & opp shoulder extension with red TB x15 each way     Shoulder Exercises: Sidelying   External Rotation Right;15 reps;AROM   External Rotation Limitations improving ROM   Flexion Right;15 reps;AROM     Shoulder Exercises: ROM/Strengthening   UBE (Upper Arm Bike) lvl 3.0 fwd/back x 3" each                      PT Long Term Goals - May 27, 2017 1127      PT LONG TERM GOAL #1   Title Pt reports able to perform ADLs, chores, and recreational activities without limitation by R shoulder or neck pain   Status On-going     PT LONG TERM GOAL #2   Title R Shoulder AROM WFL and MMT grossly 4+/5 or better   Status Partially Met  met for ROM, but not met for strength     PT LONG TERM GOAL #3   Title Independent with ongoing HEP   Status Partially Met  met for current HEP               Plan - May 27, 2017 1150    Clinical Impression Statement Rondalyn has demonstrated excellent progress with PT to date. Pt currently pain free, but has still been having some flare-ups as recently as last weekend. R shoulder AROM significantly improved and nearly symmetrical to L shoulder. Strength improving but still experiencing pain with resisted flexion and greatest weakness with ER. Continued weakness and intermittent pain still limiting functional use of R UE with daily tasks/chores. Only 2 visits remaining in POC, therefore anticipate pt may benefit from recert at end of current POC.   Rehab Potential Good   Clinical Impairments Affecting Rehab Potential chronic neck & B shoulder pain with h/o ACDF; L carpal & cubital tunnel syndrome; lumbosacral DDD; lumbar stenosis with neurogenic claudication   PT Treatment/Interventions Patient/family education;Neuromuscular re-education;ADLs/Self Care Home Management;Manual techniques;Dry needling;Taping;Passive range of motion;Therapeutic exercise;Therapeutic activities;Electrical Stimulation;Moist Heat;Cryotherapy;Iontophoresis 24m/ml Dexamethasone   PT Next Visit Plan 10th visit FOTO, G-code & MD note; f/u re: insurance coverage for home TENS/IT unit   Consulted and Agree with Plan of Care Patient      Patient will benefit from skilled therapeutic intervention in order to improve the following deficits and impairments:  Pain, Postural  dysfunction, Decreased range of motion, Decreased strength, Impaired UE functional use, Increased muscle spasms, Increased fascial restricitons, Decreased activity tolerance  Visit Diagnosis: Acute pain of right shoulder  Stiffness of right shoulder, not elsewhere classified  Abnormal posture  Muscle weakness (generalized)       G-Codes - 010/20/181154    Functional Assessment Tool Used (Outpatient Only) Shoulder FOTO - 65% (35% limitation)   Functional Limitation Carrying, moving and handling objects   Carrying, Moving and Handling Objects Current Status (([id number] At least 20 percent but less than 40 percent  impaired, limited or restricted   Carrying, Moving and Handling Objects Goal Status [phone]) At least 40 percent but less than 60 percent impaired, limited or restricted      Problem List Patient Active Problem List   Diagnosis Date Noted  . Acute bronchitis 08/10/2014  . Unstable angina ([place]) 05/29/2014  . Abnormal nuclear stress test 05/29/2014  . Chest pain 05/06/2014  . Exertional shortness of breath 05/06/2014  . Mixed hyperlipidemia 05/06/2014  . Hyperlipidemia with target LDL less than 70 08/05/2013  . HTN (hypertension) 08/05/2013  . Chest wall pain 08/05/2013  . SINUSITIS, ACUTE 04/07/2009  . HYPOTHYROIDISM 12/10/2007  . DIABETES MELLITUS, TYPE II 05/28/2007  . Obstructive sleep apnea 05/28/2007  . Coronary atherosclerosis 05/28/2007  . Seasonal and perennial allergic rhinitis 05/28/2007  . COUGH, CHRONIC 05/28/2007    Percival Spanish, PT, MPT 04/27/2017, 12:10 PM  [facility] [address]  [place], [name], [address] Phone: [phone]   Fax:  [phone]  Name: CARROLL RANNEY MRN: [id number] Date of Birth: 1955/10/15

## 2017-05-01 ENCOUNTER — Ambulatory Visit: Payer: 59 | Admitting: Physical Therapy

## 2017-05-01 DIAGNOSIS — M25511 Pain in right shoulder: Secondary | ICD-10-CM

## 2017-05-01 DIAGNOSIS — M25611 Stiffness of right shoulder, not elsewhere classified: Secondary | ICD-10-CM

## 2017-05-01 DIAGNOSIS — M791 Myalgia: Secondary | ICD-10-CM | POA: Diagnosis not present

## 2017-05-01 DIAGNOSIS — M6281 Muscle weakness (generalized): Secondary | ICD-10-CM

## 2017-05-01 DIAGNOSIS — R293 Abnormal posture: Secondary | ICD-10-CM

## 2017-05-01 NOTE — Patient Instructions (Addendum)
TENS stands for Transcutaneous Electrical Nerve Stimulation. In other words, electrical impulses are allowed to pass through the skin in order to excite a nerve.   Purpose and Use of TENS:  TENS is a method used to manage acute and chronic pain without the use of drugs. It has been effective in managing pain associated with surgery, sprains, strains, trauma, rheumatoid arthritis, and neuralgias. It is a non-addictive, low risk, and non-invasive technique used to control pain. It is not, by any means, a curative form of treatment.   How TENS Works:  Most TENS units are a Paramedic unit powered by one 9 volt battery. Attached to the outside of the unit are two lead wires where two pins and/or snaps connect on each wire. All units come with a set of four reusable pads or electrodes. These are placed on the skin surrounding the area involved. By inserting the leads into  the pads, the electricity can pass from the unit making the circuit complete.  As the intensity is turned up slowly, the electrical current enters the body from the electrodes through the skin to the surrounding nerve fibers. This triggers the release of hormones from within the body. These hormones contain pain relievers. By increasing the circulation of these hormones, the person's pain may be lessened. It is also believed that the electrical stimulation itself helps to block the pain messages being sent to the brain, thus also decreasing the body's perception of pain.   Hazards:  TENS units are NOT to be used by patients with PACEMAKERS, DEFIBRILLATORS, DIABETIC PUMPS, PREGNANT WOMEN, and patients with SEIZURE DISORDERS.  TENS units are NOT to be used over the heart, throat, brain, or spinal cord.  One of the major side effects from the TENS unit may be skin irritation. Some people may develop a rash if they are sensitive to the materials used in the electrodes or the connecting wires.   Wear the unit for 30 minutes at a time.     Avoid overuse due the body getting used to the stem making it not as effective over time.

## 2017-05-01 NOTE — Therapy (Signed)
[facility] [address]  [place], [name], [address] Phone: [phone]   Fax:  [phone]  Physical Therapy Treatment  Patient Details  [name] MRN: [id number] Date of Birth: 1956/07/31 Referring Provider: Roseanne Kaufman, MD  Encounter Date: 05/01/2017      PT End of Session - 05/01/17 1108    Visit Number 11   Number of Visits 12   Date for PT Re-Evaluation 05/02/17   Authorization Type [place] - PT only   Authorization - Number of Visits 60   PT Start Time 1108   PT Stop Time 1159   PT Time Calculation (min) 51 min   Activity Tolerance Patient tolerated treatment well   Behavior During Therapy [facility] for tasks assessed/performed      Past Medical History:  Diagnosis Date  . Acute bronchitis   . Anemia    hx of anemia  . Anginal pain ([place])   . Arthritis    osteo  . Coronary atherosclerosis   . Cough   . Hypertension   . Obstructive sleep apnea (adult) (pediatric)   . Type II or unspecified type diabetes mellitus without mention of complication, not stated as uncontrolled    type 2  . Unspecified hypothyroidism     Past Surgical History:  Procedure Laterality Date  . ANTERIOR FUSION CERVICAL SPINE    . LEFT HEART CATHETERIZATION WITH CORONARY ANGIOGRAM N/A 05/29/2014   Procedure: LEFT HEART CATHETERIZATION WITH CORONARY ANGIOGRAM;  Surgeon: Sinclair Grooms, MD;  Location: [facility] CATH LAB;  Service: Cardiovascular;  Laterality: N/A;  . TUBAL LIGATION    . WISDOM TOOTH EXTRACTION      There were no vitals filed for this visit.      Subjective Assessment - 05/01/17 1112    Subjective Doing good today.   Patient Stated Goals "to get the pain level down & use the R arm more"   Currently in Pain? No/denies   Pain Score 0-No pain                         [facility] Adult PT Treatment/Exercise - 05/01/17 1108      Shoulder Exercises: Supine   Horizontal ABduction  Both;15 reps;Theraband;Strengthening   Theraband Level (Shoulder Horizontal ABduction) Level 3 (Green)   Horizontal ABduction Limitations hooklying on pool noodle   Other Supine Exercises alt shoulder flexion & opp shoulder extension with green TB x15 each way; hookying on pool noodle     Shoulder Exercises: Prone   Extension Left;15 reps   Extension Limitations I's over edge of mat table   Horizontal ABduction 1 Left;15 reps   Horizontal ABduction 1 Limitations T's over edge of mat table   Horizontal ABduction 2 Left;10 reps   Horizontal ABduction 2 Limitations Y's over edge of mat table     Shoulder Exercises: Standing   Extension Both;15 reps;Theraband;Strengthening   Theraband Level (Shoulder Extension) Level 3 (Green)   Extension Limitations + scap retraction   Row Both;15 reps;Theraband;Strengthening   Theraband Level (Shoulder Row) Level 3 (Green)     Shoulder Exercises: ROM/Strengthening   UBE (Upper Arm Bike) lvl 3.0 fwd/back x 3" each                PT Education - 05/01/17 1210    Education provided Yes   Education Details HEP update; instructions in set-up & use of home TENS/IT unit  Person(s) Educated Patient   Methods Explanation;Demonstration;Handout   Comprehension Verbalized understanding             PT Long Term Goals - 04/27/17 1127      PT LONG TERM GOAL #1   Title Pt reports able to perform ADLs, chores, and recreational activities without limitation by R shoulder or neck pain   Status On-going     PT LONG TERM GOAL #2   Title R Shoulder AROM WFL and MMT grossly 4+/5 or better   Status Partially Met  met for ROM, but not met for strength     PT LONG TERM GOAL #3   Title Independent with ongoing HEP   Status Partially Met  met for current HEP               Plan - 05/01/17 1112    Clinical Impression Statement Pt remains pain free in shoulder today but noting increasing issue with her back and would like to transition PT to  focus to referral from Dr. Tonita Cong for her back, therefore focused today's session on HEP review/update in preparation for transition to shoulder HEP to allow shifting PT treatment focus to back. Able to tolerate progression of TB resistance to green TB and added prone scapular strengthening to HEP with caution to avoid pushing motions into painful range. Verification of insurance coverage for home TENS/IT unit received, therefore provided training in set-up and use of home unit.   Rehab Potential Good   Clinical Impairments Affecting Rehab Potential chronic neck & B shoulder pain with h/o ACDF; L carpal & cubital tunnel syndrome; lumbosacral DDD; lumbar stenosis with neurogenic claudication   PT Treatment/Interventions Patient/family education;Neuromuscular re-education;ADLs/Self Care Home Management;Manual techniques;Dry needling;Taping;Passive range of motion;Therapeutic exercise;Therapeutic activities;Electrical Stimulation;Moist Heat;Cryotherapy;Iontophoresis 76m/ml Dexamethasone   PT Next Visit Plan discharge assessment for shoulder POC   Consulted and Agree with Plan of Care Patient      Patient will benefit from skilled therapeutic intervention in order to improve the following deficits and impairments:  Pain, Postural dysfunction, Decreased range of motion, Decreased strength, Impaired UE functional use, Increased muscle spasms, Increased fascial restricitons, Decreased activity tolerance  Visit Diagnosis: Acute pain of right shoulder  Stiffness of right shoulder, not elsewhere classified  Abnormal posture  Muscle weakness (generalized)     Problem List Patient Active Problem List   Diagnosis Date Noted  . Acute bronchitis 08/10/2014  . Unstable angina ([place] 05/29/2014  . Abnormal nuclear stress test 05/29/2014  . Chest pain 05/06/2014  . Exertional shortness of breath 05/06/2014  . Mixed hyperlipidemia 05/06/2014  . Hyperlipidemia with target LDL less than 70 08/05/2013  .  HTN (hypertension) 08/05/2013  . Chest wall pain 08/05/2013  . SINUSITIS, ACUTE 04/07/2009  . HYPOTHYROIDISM 12/10/2007  . DIABETES MELLITUS, TYPE II 05/28/2007  . Obstructive sleep apnea 05/28/2007  . Coronary atherosclerosis 05/28/2007  . Seasonal and perennial allergic rhinitis 05/28/2007  . COUGH, CHRONIC 05/28/2007    JPercival Spanish PT, MPT 05/01/2017, 12:13 PM  [facility] [place] [address]Phone: [phone]  Fax:  [phone] Name: DBATSHEVA STEVICKMRN: [id number] of Birth: 1June 26, 1957

## 2017-05-04 ENCOUNTER — Ambulatory Visit: Payer: 59 | Admitting: Physical Therapy

## 2017-05-04 DIAGNOSIS — M25511 Pain in right shoulder: Secondary | ICD-10-CM | POA: Diagnosis not present

## 2017-05-04 DIAGNOSIS — M25611 Stiffness of right shoulder, not elsewhere classified: Secondary | ICD-10-CM

## 2017-05-04 DIAGNOSIS — M6281 Muscle weakness (generalized): Secondary | ICD-10-CM

## 2017-05-04 DIAGNOSIS — R293 Abnormal posture: Secondary | ICD-10-CM

## 2017-05-04 NOTE — Therapy (Signed)
[facility] [address]  [place], [name], [address] Phone: [phone]   Fax:  [phone]  Physical Therapy Treatment  Patient Details  [name] MRN: [id number] Date of Birth: Sep 28, 1955 Referring Provider: Roseanne Kaufman, MD  Encounter Date: 05/04/2017      PT End of Session - 05/04/17 1108    Visit Number 12   Number of Visits 12   Date for PT Re-Evaluation 05/02/17   Authorization Type [place] - PT only   Authorization - Number of Visits 60   PT Start Time 1108   PT Stop Time 1143   PT Time Calculation (min) 35 min   Activity Tolerance Patient tolerated treatment well   Behavior During Therapy [facility] for tasks assessed/performed      Past Medical History:  Diagnosis Date  . Acute bronchitis   . Anemia    hx of anemia  . Anginal pain ([place])   . Arthritis    osteo  . Coronary atherosclerosis   . Cough   . Hypertension   . Obstructive sleep apnea (adult) (pediatric)   . Type II or unspecified type diabetes mellitus without mention of complication, not stated as uncontrolled    type 2  . Unspecified hypothyroidism     Past Surgical History:  Procedure Laterality Date  . ANTERIOR FUSION CERVICAL SPINE    . LEFT HEART CATHETERIZATION WITH CORONARY ANGIOGRAM N/A 05/29/2014   Procedure: LEFT HEART CATHETERIZATION WITH CORONARY ANGIOGRAM;  Surgeon: Sinclair Grooms, MD;  Location: [facility] CATH LAB;  Service: Cardiovascular;  Laterality: N/A;  . TUBAL LIGATION    . WISDOM TOOTH EXTRACTION      There were no vitals filed for this visit.      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   Patient Stated Goals "to get the pain level down & use the R arm more"   Currently in Pain? No/denies   Pain Score 0-No pain            [facility]  PT Assessment - 05/04/17 1108      Assessment   Medical Diagnosis R shoulder pain   Referring Provider Roseanne Kaufman, MD   Onset Date/Surgical Date 01/06/17   Hand Dominance Right   Next MD Visit 05/31/17     Observation/Other Assessments   Focus on Therapeutic Outcomes (FOTO)  Shoulder - 65% (35% limitation)     AROM   Right Shoulder Flexion 166 Degrees   Right Shoulder ABduction 152 Degrees   Right Shoulder Internal Rotation --  FER to T12   Right Shoulder External Rotation --  FER to C6   Left Shoulder Flexion 167 Degrees   Left Shoulder ABduction 161 Degrees   Left Shoulder Internal Rotation --  FER to T11   Left Shoulder External Rotation --  FER to C7   Cervical Flexion 34   Cervical Extension 45   Cervical - Right Side Bend 30   Cervical - Left Side Bend 28   Cervical - Right Rotation 60   Cervical - Left Rotation 56     Strength   Overall Strength Comments pain with all resisted MMT on R   Right Shoulder Flexion 4/5   Right Shoulder ABduction 4+/5   Right Shoulder Internal Rotation 4/5   Right  Shoulder External Rotation 3+/5   Left Shoulder Flexion 4+/5   Left Shoulder ABduction 4+/5   Left Shoulder Internal Rotation 4+/5   Left Shoulder External Rotation 4-/5                     [facility] Adult PT Treatment/Exercise - Jun 03, 2017 1108      Shoulder Exercises: Prone   Extension Left;15 reps   Extension Limitations I's over edge of mat table   Horizontal ABduction 1 Left;15 reps   Horizontal ABduction 1 Limitations T's over edge of mat table   Horizontal ABduction 2 Left;10 reps   Horizontal ABduction 2 Limitations Y's over edge of mat table     Shoulder Exercises: ROM/Strengthening   UBE (Upper Arm Bike) lvl 3.0 fwd/back x 3" each     Shoulder Exercises: Stretch   Corner Stretch 30 seconds;1 rep   Corner Stretch Limitations low/mid/high doorway stretch                     PT Long Term Goals - 06/03/2017 1113      PT LONG TERM  GOAL #1   Title Pt reports able to perform ADLs, chores, and recreational activities without limitation by R shoulder or neck pain   Status Partially Met  reports no issues excepts avoids heavy lifting     PT LONG TERM GOAL #2   Title R Shoulder AROM WFL and MMT grossly 4+/5 or better   Status Partially Met  met for ROM, but only partially met for strength     PT LONG TERM GOAL #3   Title Independent with ongoing HEP   Status Achieved               Plan - 06/03/17 1114    Clinical Impression Statement [name] has demonstrated excellent progress with PT with restoration of nearly full R shoulder ROM and only intermittent mild pain at present. R shoulder strength has also improved but still with weakness greatest in ER, however pt reporting she vaguely recalls being told she had a partial RTC teat many years ago. Pt able to use R arm functionally with majority of daily chores and tasks, but relies more heavily on L UE for heavy lifting. Pt confident and compliant with shoulder HEP and ready to transition shoulder exercises to HEP to allow shift in PT focus to low back referral from Dr. Tonita Cong, therefore will discharge from PT for shoulder episode and plan for eval of LBP next week.   Rehab Potential Good   Clinical Impairments Affecting Rehab Potential chronic neck & B shoulder pain with h/o ACDF; L carpal & cubital tunnel syndrome; lumbosacral DDD; lumbar stenosis with neurogenic claudication   PT Treatment/Interventions Patient/family education;Neuromuscular re-education;ADLs/Self Care Home Management;Manual techniques;Dry needling;Taping;Passive range of motion;Therapeutic exercise;Therapeutic activities;Electrical Stimulation;Moist Heat;Cryotherapy;Iontophoresis 61m/ml Dexamethasone   PT Next Visit Plan discharge shoulder episode; new eval/episode for low back next week   Consulted and Agree with Plan of Care Patient      Patient will benefit from skilled therapeutic intervention in  order to improve the following deficits and impairments:  Pain, Postural dysfunction, Decreased range of motion, Decreased strength, Impaired UE functional use, Increased muscle spasms, Increased fascial restricitons, Decreased activity tolerance  Visit Diagnosis: Acute pain of right shoulder  Stiffness of right shoulder, not elsewhere classified  Abnormal posture  Muscle weakness (generalized)       G-Codes - [id number]    Functional Assessment Tool Used (Outpatient Only)  Shoulder FOTO = 65% (35% limitation)   Functional Limitation Carrying, moving and handling objects   Carrying, Moving and Handling Objects Goal Status (Q6834) At least 40 percent but less than 60 percent impaired, limited or restricted   Carrying, Moving and Handling Objects Discharge Status [phone]) At least 20 percent but less than 40 percent impaired, limited or restricted      Problem List Patient Active Problem List   Diagnosis Date Noted  . Acute bronchitis 08/10/2014  . Unstable angina ([place]) 05/29/2014  . Abnormal nuclear stress test 05/29/2014  . Chest pain 05/06/2014  . Exertional shortness of breath 05/06/2014  . Mixed hyperlipidemia 05/06/2014  . Hyperlipidemia with target LDL less than 70 08/05/2013  . HTN (hypertension) 08/05/2013  . Chest wall pain 08/05/2013  . SINUSITIS, ACUTE 04/07/2009  . HYPOTHYROIDISM 12/10/2007  . DIABETES MELLITUS, TYPE II 05/28/2007  . Obstructive sleep apnea 05/28/2007  . Coronary atherosclerosis 05/28/2007  . Seasonal and perennial allergic rhinitis 05/28/2007  . COUGH, CHRONIC 05/28/2007   PHYSICAL THERAPY DISCHARGE SUMMARY  Visits from Start of Care: 12  Current functional level related to goals / functional outcomes:   Refer to above clinical impression   Remaining deficits:   As above.   Education / Equipment:   HEP, postural education  Plan: Patient agrees to discharge.  Patient goals were not met. Patient is being discharged due to being  pleased with the current functional level.  ?????        Percival Spanish, PT, MPT  05/04/2017, 6:38 PM  [facility] [address]  [place], [name], [address] Phone: [phone]   Fax:  [phone]  [name] MRN: [id number] Date of Birth: 02/22/1956

## 2017-05-05 ENCOUNTER — Other Ambulatory Visit: Payer: Self-pay | Admitting: Cardiovascular Disease

## 2017-05-08 ENCOUNTER — Ambulatory Visit: Payer: 59 | Attending: Specialist | Admitting: Physical Therapy

## 2017-05-08 DIAGNOSIS — M5416 Radiculopathy, lumbar region: Secondary | ICD-10-CM | POA: Insufficient documentation

## 2017-05-08 DIAGNOSIS — M6281 Muscle weakness (generalized): Secondary | ICD-10-CM | POA: Diagnosis present

## 2017-05-08 DIAGNOSIS — G8929 Other chronic pain: Secondary | ICD-10-CM | POA: Diagnosis present

## 2017-05-08 DIAGNOSIS — R262 Difficulty in walking, not elsewhere classified: Secondary | ICD-10-CM | POA: Diagnosis present

## 2017-05-08 DIAGNOSIS — M545 Low back pain: Secondary | ICD-10-CM | POA: Insufficient documentation

## 2017-05-08 NOTE — Therapy (Signed)
[facility] [address]  [place], [name], [address] Phone: [phone]   Fax:  [phone]  Physical Therapy Evaluation  Patient Details  [name] MRN: [id number] Date of Birth: [birth year] Referring Provider: Susa Day, MD  Encounter Date: 05/08/2017      PT End of Session - 05/08/17 1108    Visit Number 1   Number of Visits 12   Date for PT Re-Evaluation 06/23/17   Authorization Type [place] - PT only   Authorization - Number of Visits 48  12 of 60 used for shoulder   PT Start Time 1108   PT Stop Time 1159   PT Time Calculation (min) 51 min   Activity Tolerance Patient tolerated treatment well   Behavior During Therapy WFL for tasks assessed/performed      Past Medical History:  Diagnosis Date  . Acute bronchitis   . Anemia    hx of anemia  . Anginal pain ([place])   . Arthritis    osteo  . Coronary atherosclerosis   . Cough   . Hypertension   . Obstructive sleep apnea (adult) (pediatric)   . Type II or unspecified type diabetes mellitus without mention of complication, not stated as uncontrolled    type 2  . Unspecified hypothyroidism     Past Surgical History:  Procedure Laterality Date  . ANTERIOR FUSION CERVICAL SPINE    . LEFT HEART CATHETERIZATION WITH CORONARY ANGIOGRAM N/A 05/29/2014   Procedure: LEFT HEART CATHETERIZATION WITH CORONARY ANGIOGRAM;  Surgeon: Sinclair Grooms, MD;  Location: [facility] CATH LAB;  Service: Cardiovascular;  Laterality: N/A;  . TUBAL LIGATION    . WISDOM TOOTH EXTRACTION      There were no vitals filed for this visit.       Subjective Assessment - 05/08/17 1112    Subjective Pt reports worsening low back pain over the past 6 months or more, now with radiation into anterior hip/groin. No known initial injury or tirgger.   Limitations Standing;Walking   How long can you stand comfortably? 30 minutes   How long can you walk comfortably? 35  minutes   Patient Stated Goals "less pain and able to stand or walk for longer peroids"   Currently in Pain? Yes   Pain Score 0-No pain  up to 10/10 at worst   Pain Location Back   Pain Orientation Lower   Pain Descriptors / Indicators Aching;Cramping   Pain Type Chronic pain   Pain Radiating Towards around R side into groin   Pain Onset More than a month ago  > 6 monhts   Pain Frequency Intermittent   Aggravating Factors  prolonged walking or standing   Pain Relieving Factors sleeping on heating pad   Effect of Pain on Daily Activities limited standing tolerance            [facility] PT Assessment - 05/08/17 1108      Assessment   Medical Diagnosis Low back pain - lumbosacral DDD, lumbar spinal stenosis with neurogenic claudication   Referring Provider Susa Day, MD   Onset Date/Surgical Date --  > 6 months   Hand Dominance Right   Next MD Visit 06/05/17   Prior Therapy PT for R shoulder pain 03/21/17 - 05/04/17; PT for neck & shoulders 2016; OT for carpal tunnel & shoulders 2017     Balance Screen   Has the patient fallen in the past 6 months No  Has the patient had a decrease in activity level because of a fear of falling?  No   Is the patient reluctant to leave their home because of a fear of falling?  No     Home Environment   Living Environment Private residence   Living Arrangements Other relatives  sister   Type of [place] to enter   Entrance Stairs-Number of Steps 4 or 2   Home Layout Two level;Bed/bath upstairs     Prior Function   Level of Independence Independent   Vocation Full time employment   Vocation Requirements bus driver   Leisure gardening     Observation/Other Assessments   Focus on Therapeutic Outcomes (FOTO)  Lumbar spine - 45% (55% limitation); predicted 53% (47% limitation)     ROM / Strength   AROM / PROM / Strength AROM;Strength     AROM   AROM Assessment Site Lumbar   Lumbar Flexion hands to ankles - tight    Lumbar Extension 25% - pain & tightness   Lumbar - Right Side Bend hand to 2" above knee   Lumbar - Left Side Bend hand to lateral knee   Lumbar - Right Rotation 75% - tight   Lumbar - Left Rotation 75% - tight     Strength   Strength Assessment Site Hip;Knee   Right/Left Hip Right;Left   Right Hip Flexion 4/5   Right Hip Extension 3-/5   Right Hip External Rotation  3+/5   Right Hip Internal Rotation 4-/5   Right Hip ABduction 4-/5   Right Hip ADduction 3/5   Left Hip Flexion 4/5   Left Hip Extension 3/5   Left Hip External Rotation 3+/5   Left Hip Internal Rotation 4-/5   Left Hip ABduction 4-/5   Left Hip ADduction 3-/5   Right/Left Knee Right;Left   Right Knee Flexion 4/5   Right Knee Extension 4/5   Left Knee Flexion 4-/5  pain in calf with resistance   Left Knee Extension 4-/5     Flexibility   Soft Tissue Assessment /Muscle Length yes   Hamstrings mild tight B   Quadriceps mod to severe tightness - quads & hip flexors R>L   ITB mod tight B   Piriformis mod to severely tight R>L     Special Tests    Special Tests Lumbar   Lumbar Tests Straight Leg Raise     Straight Leg Raise   Findings Positive   Side  Left            Objective measurements completed on examination: See above findings.          [place] Adult PT Treatment/Exercise - 05/08/17 1108      Exercises   Exercises Lumbar     Lumbar Exercises: Stretches   Lower Trunk Rotation 10 seconds;5 reps   Hip Flexor Stretch 30 seconds;2 reps   Hip Flexor Stretch Limitations mod thomas with strap   ITB Stretch 30 seconds;2 reps   ITB Stretch Limitations supine with strap   Piriformis Stretch 30 seconds;2 reps  each   Piriformis Stretch Limitations supine figure 4 with strap & KTOS     Lumbar Exercises: Supine   Ab Set 10 reps;5 seconds   AB Set Limitations + pelvic tilt   Bridge 10 reps;5 seconds                PT Education - 05/08/17 1159    Education provided Yes  Education Details PT eval findings, anticipated POC and initial LBP HEP   Person(s) Educated Patient   Methods Explanation;Demonstration;Handout   Comprehension Verbalized understanding;Returned demonstration;Need further instruction             PT Long Term Goals - 05/08/17 1159      PT LONG TERM GOAL #1   Title Pt reports able to perform ADLs, chores, and recreational activities without limitation by low back pain or radicular symptoms   Status New   Target Date 06/23/17     PT LONG TERM GOAL #2   Title Lumbar ROM WFL w/o limitation due to pain    Status New   Target Date 06/23/17     PT LONG TERM GOAL #3   Title B hip and knee strength >/= 4/5 for improved stability   Status New   Target Date 06/23/17     PT LONG TERM GOAL #4   Title Pt will report ability to stand or walk for >45 minutes w/o limitation due to low back pain or radicular symtpoms   Status New   Target Date 06/23/17     PT LONG TERM GOAL #5   Title Independent with ongoing HEP   Status New   Target Date 06/23/17                Plan - 05/08/17 1203    Clinical Impression Statement [name] is a 61 y/o female who presents to OP PT for a moderate complexity eval for low back pain with R sided radiculopathy into anterior hip & groin. Pain originated >6 months ago w/o known triggering event and has been worsening over past 6 months, with pt reporting noting pain has recently progressed to include radiation into R hip and groin. Lumbar ROM mildly restricted in all directions due to tightness, with greatest limitation in extension due to pain and tightness. Mod to severe tightness present in proximal LE musculature, esp quads, hip flexors and piriformis (R>L). Mild to moderate weakness present throughout proximal LE musculature. Pt reporting pain and weakness limit standing and walking tolerance. [name] will benefit from skilled PT for postural training with emphasis on neutral spine alignment, increasing  LE flexibility, core/lumbar stabilization and LE strengthening, as well as balance and gait training to reduce risk for further falls. May consider dry needling for pain/increased muscle tension as well as modalities including traction as indicated for pain and radicular symptoms.   History and Personal Factors relevant to plan of care: chronic neck & B shoulder pain with h/o ACDF; L carpal & cubital tunnel syndrome; h/o B ankle surgery; still rehabing at HEP level for R shoulder pain & LOM   Clinical Presentation Evolving   Clinical Presentation due to: progressive radicular pain   Clinical Decision Making Moderate   Rehab Potential Good   Clinical Impairments Affecting Rehab Potential chronic neck & B shoulder pain with h/o ACDF; L carpal & cubital tunnel syndrome; h/o B ankle surgery; still rehabing at HEP level for R shoulder pain & LOM   PT Frequency 2x / week   PT Duration 6 weeks   PT Treatment/Interventions Patient/family education;Neuromuscular re-education;ADLs/Self Care Home Management;Manual techniques;Dry needling;Taping;Therapeutic exercise;Therapeutic activities;Electrical Stimulation;Moist Heat;Cryotherapy;Iontophoresis 4mg /ml Dexamethasone;Functional mobility training   Consulted and Agree with Plan of Care Patient      Patient will benefit from skilled therapeutic intervention in order to improve the following deficits and impairments:  Pain, Increased muscle spasms, Impaired flexibility, Decreased range of motion, Decreased strength, Decreased activity tolerance, Difficulty  walking  Visit Diagnosis: Chronic bilateral low back pain, with sciatica presence unspecified  Radiculopathy, lumbar region  Muscle weakness (generalized)  Difficulty in walking, not elsewhere classified      G-Codes - 05/17/2017 1201    Functional Assessment Tool Used (Outpatient Only) Lumbar spine FOTO - 45% (55% limitation)   Functional Limitation Mobility: Walking and moving around   Mobility:  Walking and Moving Around Current Status ([id number]) At least 40 percent but less than 60 percent impaired, limited or restricted   Mobility: Walking and Moving Around Goal Status [phone]) At least 40 percent but less than 60 percent impaired, limited or restricted       Problem List Patient Active Problem List   Diagnosis Date Noted  . Acute bronchitis 08/10/2014  . Unstable angina ([place]) 05/29/2014  . Abnormal nuclear stress test 05/29/2014  . Chest pain 05/06/2014  . Exertional shortness of breath 05/06/2014  . Mixed hyperlipidemia 05/06/2014  . Hyperlipidemia with target LDL less than 70 08/05/2013  . HTN (hypertension) 08/05/2013  . Chest wall pain 08/05/2013  . SINUSITIS, ACUTE 04/07/2009  . HYPOTHYROIDISM 12/10/2007  . DIABETES MELLITUS, TYPE II 05/28/2007  . Obstructive sleep apnea 05/28/2007  . Coronary atherosclerosis 05/28/2007  . Seasonal and perennial allergic rhinitis 05/28/2007  . COUGH, CHRONIC 05/28/2007    Percival Spanish, PT, MPT 2017/05/17, 6:31 PM  [facility] [address]  [place], [name], [address] Phone: [phone]   Fax:  [phone]  [name] MRN: [id number] Date of Birth: 02/28/1956

## 2017-05-11 ENCOUNTER — Ambulatory Visit: Payer: 59 | Admitting: Physical Therapy

## 2017-05-11 DIAGNOSIS — M545 Low back pain: Principal | ICD-10-CM

## 2017-05-11 DIAGNOSIS — M6281 Muscle weakness (generalized): Secondary | ICD-10-CM

## 2017-05-11 DIAGNOSIS — M5416 Radiculopathy, lumbar region: Secondary | ICD-10-CM

## 2017-05-11 DIAGNOSIS — R262 Difficulty in walking, not elsewhere classified: Secondary | ICD-10-CM

## 2017-05-11 DIAGNOSIS — G8929 Other chronic pain: Secondary | ICD-10-CM

## 2017-05-11 NOTE — Therapy (Signed)
[facility] [address]  [place], [name], [address] Phone: [phone]   Fax:  [phone]  Physical Therapy Treatment  Patient Details  [name] MRN: [id number] Date of Birth: 10-14-1955 Referring Provider: Susa Day, MD  Encounter Date: 05/11/2017      PT End of Session - 05/11/17 1112    Visit Number 2   Number of Visits 12   Date for PT Re-Evaluation 06/23/17   Authorization Type [place] - PT only   Authorization - Number of Visits 48  12 of 60 used for shoulder   PT Start Time 1112   PT Stop Time 1204   PT Time Calculation (min) 52 min   Activity Tolerance Patient tolerated treatment well   Behavior During Therapy WFL for tasks assessed/performed      Past Medical History:  Diagnosis Date  . Acute bronchitis   . Anemia    hx of anemia  . Anginal pain ([place])   . Arthritis    osteo  . Coronary atherosclerosis   . Cough   . Hypertension   . Obstructive sleep apnea (adult) (pediatric)   . Type II or unspecified type diabetes mellitus without mention of complication, not stated as uncontrolled    type 2  . Unspecified hypothyroidism     Past Surgical History:  Procedure Laterality Date  . ANTERIOR FUSION CERVICAL SPINE    . LEFT HEART CATHETERIZATION WITH CORONARY ANGIOGRAM N/A 05/29/2014   Procedure: LEFT HEART CATHETERIZATION WITH CORONARY ANGIOGRAM;  Surgeon: Sinclair Grooms, MD;  Location: [facility] CATH LAB;  Service: Cardiovascular;  Laterality: N/A;  . TUBAL LIGATION    . WISDOM TOOTH EXTRACTION      There were no vitals filed for this visit.      Subjective Assessment - 05/11/17 1117    Subjective Pt reports worsening low back pain over the past 6 months or more, now with radiation into anterior hip/groin. No known initial injury or tirgger.   Limitations Standing;Walking   How long can you stand comfortably? 30 minutes   How long can you walk comfortably? 35  minutes   Patient Stated Goals "less pain and able to stand or walk for longer peroids"   Currently in Pain? Yes   Pain Score 6    Pain Location Back   Pain Orientation Lower   Pain Descriptors / Indicators Aching   Pain Type Chronic pain   Pain Onset --  > 6 monhts                         [facility] Adult PT Treatment/Exercise - 05/11/17 1112      Transfers   Comments Provided instruction in log rolling and proper supine to sit     Self-Care   Self-Care Posture   Posture Provided instruction in proper posture and body mechanics for typical daily tasks, using handout as reference for follow through at home.     Lumbar Exercises: Stretches   Lower Trunk Rotation 10 seconds;5 reps   Hip Flexor Stretch 30 seconds;2 reps   Hip Flexor Stretch Limitations mod thomas with strap - cues to avoid flexing target hip   ITB Stretch 30 seconds;2 reps   ITB Stretch Limitations supine with strap - cues to avoid pulling into excessive hip flexion to keep HS on slight slack   Piriformis Stretch 30 seconds;2 reps  each   Piriformis Stretch  Limitations supine figure 4 with strap & KTOS - cues for proper technique and avoiding pulling into painful range     Lumbar Exercises: Aerobic   Stationary Bike NuStep - lvl 4 x 6'     Lumbar Exercises: Supine   Ab Set 10 reps;5 seconds   AB Set Limitations + pelvic tilt   Bridge 10 reps;5 seconds                PT Education - 05/11/17 1112    Education provided Yes   Education Details Posture & body mechanics education   Person(s) Educated Patient   Methods Explanation;Demonstration;Handout   Comprehension Verbalized understanding;Need further instruction             PT Long Term Goals - 05/11/17 1118      PT LONG TERM GOAL #1   Title Pt reports able to perform ADLs, chores, and recreational activities without limitation by low back pain or radicular symptoms   Status On-going     PT LONG TERM GOAL #2   Title Lumbar  ROM WFL w/o limitation due to pain    Status On-going     PT LONG TERM GOAL #3   Title B hip and knee strength >/= 4/5 for improved stability   Status On-going     PT LONG TERM GOAL #4   Title Pt will report ability to stand or walk for >45 minutes w/o limitation due to low back pain or radicular symtpoms   Status On-going     PT LONG TERM GOAL #5   Title Independent with ongoing HEP   Status On-going               Plan - 05/11/17 1119    Clinical Impression Statement Pt reporting some difficulty with piriformis stretches at home, therefore initial HEP reviewed. Pt requiring quite a bit of cueing for positioning and technique, esp avoiding pulling excessively into painful ROM. Pt verbalizing better understanding of stretches and initial lumbopelvic strengthening/stabilization after review. Pt noted to transition supine <> sit through longsitting creating excessive flexion strain on low back, therefore remainder of visit focusing on posture and body mechanics education targeting neutral spine with typical daily tasks including bed mobilty with pt noting a difference in pain levels when moving using good body mechanics..   Rehab Potential Good   Clinical Impairments Affecting Rehab Potential chronic neck & B shoulder pain with h/o ACDF; L carpal & cubital tunnel syndrome; h/o B ankle surgery; still rehabing at HEP level for R shoulder pain & LOM   PT Treatment/Interventions Patient/family education;Neuromuscular re-education;ADLs/Self Care Home Management;Manual techniques;Dry needling;Taping;Therapeutic exercise;Therapeutic activities;Electrical Stimulation;Moist Heat;Cryotherapy;Iontophoresis 4mg /ml Dexamethasone;Functional mobility training   Consulted and Agree with Plan of Care Patient      Patient will benefit from skilled therapeutic intervention in order to improve the following deficits and impairments:  Pain, Increased muscle spasms, Impaired flexibility, Decreased range of  motion, Decreased strength, Decreased activity tolerance, Difficulty walking  Visit Diagnosis: Chronic bilateral low back pain, with sciatica presence unspecified  Radiculopathy, lumbar region  Muscle weakness (generalized)  Difficulty in walking, not elsewhere classified     Problem List Patient Active Problem List   Diagnosis Date Noted  . Acute bronchitis 08/10/2014  . Unstable angina ([place]) 05/29/2014  . Abnormal nuclear stress test 05/29/2014  . Chest pain 05/06/2014  . Exertional shortness of breath 05/06/2014  . Mixed hyperlipidemia 05/06/2014  . Hyperlipidemia with target LDL less than 70 08/05/2013  . HTN (hypertension)  08/05/2013  . Chest wall pain 08/05/2013  . SINUSITIS, ACUTE 04/07/2009  . HYPOTHYROIDISM 12/10/2007  . DIABETES MELLITUS, TYPE II 05/28/2007  . Obstructive sleep apnea 05/28/2007  . Coronary atherosclerosis 05/28/2007  . Seasonal and perennial allergic rhinitis 05/28/2007  . COUGH, CHRONIC 05/28/2007    Percival Spanish, PT, MPT 05/11/2017, 12:17 PM  [facility] [address]  [place], [name], [address] Phone: [phone]   Fax:  [phone]  [name] MRN: [id number] Date of Birth: [birth year]

## 2017-05-11 NOTE — Patient Instructions (Signed)

## 2017-05-15 ENCOUNTER — Ambulatory Visit: Payer: 59 | Admitting: Physical Therapy

## 2017-05-15 DIAGNOSIS — M5416 Radiculopathy, lumbar region: Secondary | ICD-10-CM

## 2017-05-15 DIAGNOSIS — M545 Low back pain: Principal | ICD-10-CM

## 2017-05-15 DIAGNOSIS — G8929 Other chronic pain: Secondary | ICD-10-CM

## 2017-05-15 DIAGNOSIS — R262 Difficulty in walking, not elsewhere classified: Secondary | ICD-10-CM

## 2017-05-15 DIAGNOSIS — M6281 Muscle weakness (generalized): Secondary | ICD-10-CM

## 2017-05-15 NOTE — Therapy (Addendum)
[facility] [address]  [place], [name], [address] Phone: [phone]   Fax:  [phone]  Physical Therapy Treatment  Patient Details  [name] MRN: [id number] Date of Birth: 07/01/1956 Referring Provider: Susa Day, MD  Encounter Date: 05/15/2017      PT End of Session - 05/15/17 1105    Visit Number 3   Number of Visits 12   Date for PT Re-Evaluation 06/23/17   Authorization Type [place] - PT only   Authorization - Number of Visits 48  12 of 60 used for shoulder   PT Start Time 1105   PT Stop Time 1149   PT Time Calculation (min) 44 min   Activity Tolerance Patient tolerated treatment well   Behavior During Therapy WFL for tasks assessed/performed      Past Medical History:  Diagnosis Date  . Acute bronchitis   . Anemia    hx of anemia  . Anginal pain ([place])   . Arthritis    osteo  . Coronary atherosclerosis   . Cough   . Hypertension   . Obstructive sleep apnea (adult) (pediatric)   . Type II or unspecified type diabetes mellitus without mention of complication, not stated as uncontrolled    type 2  . Unspecified hypothyroidism     Past Surgical History:  Procedure Laterality Date  . ANTERIOR FUSION CERVICAL SPINE    . LEFT HEART CATHETERIZATION WITH CORONARY ANGIOGRAM N/A 05/29/2014   Procedure: LEFT HEART CATHETERIZATION WITH CORONARY ANGIOGRAM;  Surgeon: Sinclair Grooms, MD;  Location: [facility] CATH LAB;  Service: Cardiovascular;  Laterality: N/A;  . TUBAL LIGATION    . WISDOM TOOTH EXTRACTION      There were no vitals filed for this visit.      Subjective Assessment - 05/15/17 1108    Subjective Pt reports worsening low back pain over the past 6 months or more, now with radiation into anterior hip/groin. No known initial injury or tirgger.   Patient Stated Goals "less pain and able to stand or walk for longer peroids"   Currently in Pain? No/denies   Pain  Score 0-No pain  up to 1/10 with certain movements   Pain Location Groin   Pain Orientation Right   Pain Type Chronic pain   Pain Frequency Intermittent                         [facility] Adult PT Treatment/Exercise - 05/15/17 1105      Lumbar Exercises: Stretches   Hip Flexor Stretch 30 seconds;2 reps   Hip Flexor Stretch Limitations lunge position with knee on chair   Quadruped Mid Back Stretch 30 seconds;2 reps   Quadruped Mid Back Stretch Limitations seated 3 way prayer stretch with green Pball   Quad Stretch 30 seconds;3 reps   Quad Stretch Limitations prone with strap, hip extended with knee on pillow     Lumbar Exercises: Aerobic   Stationary Bike Rec bike - lvl 1 x 2' (stopped d/t hip flexor cramp)     Lumbar Exercises: Supine   Clam 10 reps;3 seconds   Clam Limitations TrA + alt hip ABD/ER with red TB   Bent Knee Raise 10 reps;3 seconds   Bent Knee Raise Limitations brace marching with red TB     Manual Therapy   Manual Therapy Soft tissue mobilization;Myofascial release;Joint mobilization;Other (comment)   Manual therapy comments pt supine &  prone   Joint Mobilization L3-5 CPAs & UPAs   Soft tissue mobilization R quads, esp RF & iliopsoas   Other Manual Therapy Roller stick to R quads in hooklying & supine - pt instructed in use of rolling pin for home use.                PT Education - 05/15/17 1145    Education provided Yes   Education Details HEP addition - quad/hip flexor & low back stretches   Person(s) Educated Patient   Methods Explanation;Demonstration;Handout   Comprehension Verbalized understanding;Returned demonstration;Need further instruction             PT Long Term Goals - 05/11/17 1118      PT LONG TERM GOAL #1   Title Pt reports able to perform ADLs, chores, and recreational activities without limitation by low back pain or radicular symptoms   Status On-going     PT LONG TERM GOAL #2   Title Lumbar ROM WFL w/o  limitation due to pain    Status On-going     PT LONG TERM GOAL #3   Title B hip and knee strength >/= 4/5 for improved stability   Status On-going     PT LONG TERM GOAL #4   Title Pt will report ability to stand or walk for >45 minutes w/o limitation due to low back pain or radicular symtpoms   Status On-going     PT LONG TERM GOAL #5   Title Independent with ongoing HEP   Status On-going               Plan - 05/15/17 1153    Clinical Impression Statement Pt reporting noe concerns with HEP today and denies need for further review. Warm-up and exercise tolerance limited by repeated cramping in B quads, R>L, therefore focused on manual therapy and stretching to affected muscles as well as lumbar paraspinals with pt noting some relief from this.   Rehab Potential Good   Clinical Impairments Affecting Rehab Potential chronic neck & B shoulder pain with h/o ACDF; L carpal & cubital tunnel syndrome; h/o B ankle surgery; still rehabing at HEP level for R shoulder pain & LOM   PT Treatment/Interventions Patient/family education;Neuromuscular re-education;ADLs/Self Care Home Management;Manual techniques;Dry needling;Taping;Therapeutic exercise;Therapeutic activities;Electrical Stimulation;Moist Heat;Cryotherapy;Iontophoresis 4mg /ml Dexamethasone;Functional mobility training   Consulted and Agree with Plan of Care Patient      Patient will benefit from skilled therapeutic intervention in order to improve the following deficits and impairments:  Pain, Increased muscle spasms, Impaired flexibility, Decreased range of motion, Decreased strength, Decreased activity tolerance, Difficulty walking  Visit Diagnosis: Chronic bilateral low back pain, with sciatica presence unspecified  Radiculopathy, lumbar region  Muscle weakness (generalized)  Difficulty in walking, not elsewhere classified     Problem List Patient Active Problem List   Diagnosis Date Noted  . Acute bronchitis  08/10/2014  . Unstable angina ([place]) 05/29/2014  . Abnormal nuclear stress test 05/29/2014  . Chest pain 05/06/2014  . Exertional shortness of breath 05/06/2014  . Mixed hyperlipidemia 05/06/2014  . Hyperlipidemia with target LDL less than 70 08/05/2013  . HTN (hypertension) 08/05/2013  . Chest wall pain 08/05/2013  . SINUSITIS, ACUTE 04/07/2009  . HYPOTHYROIDISM 12/10/2007  . DIABETES MELLITUS, TYPE II 05/28/2007  . Obstructive sleep apnea 05/28/2007  . Coronary atherosclerosis 05/28/2007  . Seasonal and perennial allergic rhinitis 05/28/2007  . COUGH, CHRONIC 05/28/2007    Percival Spanish, PT, MPT 05/15/2017, 5:13 PM  [facility] [address]  [place], [name], [address] Phone: [phone]   Fax:  [phone]  [name] MRN: [id number] Date of Birth: September 30, 1955

## 2017-05-18 ENCOUNTER — Ambulatory Visit: Payer: 59 | Admitting: Physical Therapy

## 2017-05-22 ENCOUNTER — Ambulatory Visit: Payer: 59 | Admitting: Physical Therapy

## 2017-05-24 ENCOUNTER — Other Ambulatory Visit: Payer: Self-pay | Admitting: Cardiovascular Disease

## 2017-05-25 ENCOUNTER — Ambulatory Visit: Payer: 59 | Admitting: Physical Therapy

## 2017-05-29 ENCOUNTER — Ambulatory Visit: Payer: 59 | Admitting: Physical Therapy

## 2017-05-29 DIAGNOSIS — M545 Low back pain: Secondary | ICD-10-CM | POA: Diagnosis not present

## 2017-05-29 DIAGNOSIS — R262 Difficulty in walking, not elsewhere classified: Secondary | ICD-10-CM

## 2017-05-29 DIAGNOSIS — M5416 Radiculopathy, lumbar region: Secondary | ICD-10-CM

## 2017-05-29 DIAGNOSIS — G8929 Other chronic pain: Secondary | ICD-10-CM

## 2017-05-29 DIAGNOSIS — M6281 Muscle weakness (generalized): Secondary | ICD-10-CM

## 2017-05-29 NOTE — Therapy (Signed)
[facility] [address]  [place], [name], [address] Phone: [phone]   Fax:  [phone]  Physical Therapy Treatment  Patient Details  [name] MRN: [id number] Date of Birth: May 16, 1956 Referring Provider: Susa Day, MD  Encounter Date: 05/29/2017      PT End of Session - 05/29/17 1103    Visit Number 4   Number of Visits 12   Date for PT Re-Evaluation 06/23/17   Authorization Type [place] - PT only   Authorization - Number of Visits 48  12 of 60 used for shoulder   PT Start Time 1103   PT Stop Time 1146   PT Time Calculation (min) 43 min   Activity Tolerance Patient tolerated treatment well   Behavior During Therapy WFL for tasks assessed/performed      Past Medical History:  Diagnosis Date  . Acute bronchitis   . Anemia    hx of anemia  . Anginal pain ([place])   . Arthritis    osteo  . Coronary atherosclerosis   . Cough   . Hypertension   . Obstructive sleep apnea (adult) (pediatric)   . Type II or unspecified type diabetes mellitus without mention of complication, not stated as uncontrolled    type 2  . Unspecified hypothyroidism     Past Surgical History:  Procedure Laterality Date  . ANTERIOR FUSION CERVICAL SPINE    . LEFT HEART CATHETERIZATION WITH CORONARY ANGIOGRAM N/A 05/29/2014   Procedure: LEFT HEART CATHETERIZATION WITH CORONARY ANGIOGRAM;  Surgeon: Sinclair Grooms, MD;  Location: [facility] CATH LAB;  Service: Cardiovascular;  Laterality: N/A;  . TUBAL LIGATION    . WISDOM TOOTH EXTRACTION      There were no vitals filed for this visit.      Subjective Assessment - 05/29/17 1105    Subjective Pt reporting pain has been much better. No pain currently, but did has a small flare-up last night after a busy weekend. At this point only noting "twinges" in the front of her R hip.   Patient Stated Goals "less pain and able to stand or walk for longer peroids"   Currently in Pain? No/denies   Pain Score 0-No pain                         [facility] Adult PT Treatment/Exercise - 05/29/17 1103      Exercises   Exercises Lumbar     Lumbar Exercises: Stretches   Hip Flexor Stretch 30 seconds;2 reps   Hip Flexor Stretch Limitations lunge position with knee on Airex pad on floor   Quad Stretch 30 seconds;3 reps   Quad Stretch Limitations prone with strap & hip extended (knee on rolled yoga mat)     Lumbar Exercises: Aerobic   Stationary Bike NuStep - lvl 5 x 6'     Lumbar Exercises: Standing   Row Both;10 reps;Theraband   Theraband Level (Row) Level 3 (Green)   Row Limitations staggered stance   Shoulder Extension Both;10 reps;Theraband;Strengthening   Theraband Level (Shoulder Extension) Level 3 (Green)   Shoulder Extension Limitations staggered stance     Knee/Hip Exercises: Standing   Hip Flexion Both;10 reps;Knee straight;Stengthening   Hip Flexion Limitations abd bracing + red TB at ankle; 2 pole A   Hip ADduction Both;10 reps;Strengthening   Hip ADduction Limitations abd bracing + red TB at ankle; 2 pole A   Hip Abduction  Right;10 reps;Left;5 sets;Knee straight;Stengthening  L hip ABD discontinued d/t increased R hip pain with SLS   Abduction Limitations abd bracing + red TB at ankle; 2 pole A   Hip Extension Both;10 reps;Knee straight;Stengthening   Extension Limitations abd bracing + red TB at ankle; 2 pole A     Manual Therapy   Manual Therapy Soft tissue mobilization   Manual therapy comments pt in slight hooklying   Soft tissue mobilization R iliacus, iliopsoas & prox quads, esp RF                     PT Long Term Goals - 05/11/17 1118      PT LONG TERM GOAL #1   Title Pt reports able to perform ADLs, chores, and recreational activities without limitation by low back pain or radicular symptoms   Status On-going     PT LONG TERM GOAL #2   Title Lumbar ROM WFL w/o limitation due to pain    Status  On-going     PT LONG TERM GOAL #3   Title B hip and knee strength >/= 4/5 for improved stability   Status On-going     PT LONG TERM GOAL #4   Title Pt will report ability to stand or walk for >45 minutes w/o limitation due to low back pain or radicular symtpoms   Status On-going     PT LONG TERM GOAL #5   Title Independent with ongoing HEP   Status On-going               Plan - 05/29/17 1150    Clinical Impression Statement Pt noting benefit from PT with overall pain better but still noting occasioal "twinge" in R hip flexors, 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 Pt remains ttp over iliacus and iliopsoas, therefore addressed this with manual therapy and reviewed hip flexor stretching, providing multiple varied options to achieve stretch. Initiated standing strengthening program with good tolerance other than some R hip discomfort noted in SLS with L hip ABD.   Rehab Potential Good   Clinical Impairments Affecting Rehab Potential chronic neck & B shoulder pain with h/o ACDF; L carpal & cubital tunnel syndrome; h/o B ankle surgery; still rehabing at HEP level for R shoulder pain & LOM   PT Treatment/Interventions Patient/family education;Neuromuscular re-education;ADLs/Self Care Home Management;Manual techniques;Dry needling;Taping;Therapeutic exercise;Therapeutic activities;Electrical Stimulation;Moist Heat;Cryotherapy;Iontophoresis 4mg /ml Dexamethasone;Functional mobility training   Consulted and Agree with Plan of Care Patient      Patient will benefit from skilled therapeutic intervention in order to improve the following deficits and impairments:  Pain, Increased muscle spasms, Impaired flexibility, Decreased range of motion, Decreased strength, Decreased activity tolerance, Difficulty walking  Visit Diagnosis: Chronic bilateral low back pain, with sciatica presence unspecified  Radiculopathy, lumbar region  Muscle weakness (generalized)  Difficulty in walking,  not elsewhere classified     Problem List Patient Active Problem List   Diagnosis Date Noted  . Acute bronchitis 08/10/2014  . Unstable angina ([place]) 05/29/2014  . Abnormal nuclear stress test 05/29/2014  . Chest pain 05/06/2014  . Exertional shortness of breath 05/06/2014  . Mixed hyperlipidemia 05/06/2014  . Hyperlipidemia with target LDL less than 70 08/05/2013  . HTN (hypertension) 08/05/2013  . Chest wall pain 08/05/2013  . SINUSITIS, ACUTE 04/07/2009  . HYPOTHYROIDISM 12/10/2007  . DIABETES MELLITUS, TYPE II 05/28/2007  . Obstructive sleep apnea 05/28/2007  . Coronary atherosclerosis 05/28/2007  . Seasonal and perennial allergic rhinitis 05/28/2007  .  COUGH, CHRONIC 05/28/2007    Percival Spanish, PT, MPT 05/29/2017, 11:55 AM  [facility] [address]  [place], [name], [address] Phone: [phone]   Fax:  [phone]  [name] MRN: [id number] Date of Birth: [birth year]

## 2017-05-31 DIAGNOSIS — M791 Myalgia, unspecified site: Secondary | ICD-10-CM | POA: Diagnosis not present

## 2017-06-01 ENCOUNTER — Ambulatory Visit: Payer: 59 | Admitting: Physical Therapy

## 2017-06-01 DIAGNOSIS — M545 Low back pain: Secondary | ICD-10-CM | POA: Diagnosis not present

## 2017-06-01 DIAGNOSIS — R262 Difficulty in walking, not elsewhere classified: Secondary | ICD-10-CM

## 2017-06-01 DIAGNOSIS — M5416 Radiculopathy, lumbar region: Secondary | ICD-10-CM

## 2017-06-01 DIAGNOSIS — G8929 Other chronic pain: Secondary | ICD-10-CM

## 2017-06-01 DIAGNOSIS — M6281 Muscle weakness (generalized): Secondary | ICD-10-CM

## 2017-06-01 NOTE — Therapy (Signed)
[facility] [address]  [place], [name], [address] Phone: [phone]   Fax:  [phone]  Physical Therapy Treatment  Patient Details  [name] MRN: [id number] Date of Birth: [birth year] Referring Provider: Susa Day, MD  Encounter Date: 06/01/2017      PT End of Session - 06/01/17 1101    Visit Number 5   Number of Visits 12   Date for PT Re-Evaluation 06/23/17   Authorization Type [place] - PT only   Authorization - Number of Visits 48  12 of 60 used for shoulder   PT Start Time 1101   PT Stop Time 1155   PT Time Calculation (min) 54 min   Activity Tolerance Patient tolerated treatment well   Behavior During Therapy WFL for tasks assessed/performed      Past Medical History:  Diagnosis Date  . Acute bronchitis   . Anemia    hx of anemia  . Anginal pain ([place])   . Arthritis    osteo  . Coronary atherosclerosis   . Cough   . Hypertension   . Obstructive sleep apnea (adult) (pediatric)   . Type II or unspecified type diabetes mellitus without mention of complication, not stated as uncontrolled    type 2  . Unspecified hypothyroidism     Past Surgical History:  Procedure Laterality Date  . ANTERIOR FUSION CERVICAL SPINE    . LEFT HEART CATHETERIZATION WITH CORONARY ANGIOGRAM N/A 05/29/2014   Procedure: LEFT HEART CATHETERIZATION WITH CORONARY ANGIOGRAM;  Surgeon: Sinclair Grooms, MD;  Location: [facility] CATH LAB;  Service: Cardiovascular;  Laterality: N/A;  . TUBAL LIGATION    . WISDOM TOOTH EXTRACTION      There were no vitals filed for this visit.      Subjective Assessment - 06/01/17 1105    Subjective Pt reports she "did something I shouldn't have" on Tuesday - worked on her plants prepping them to come indoors and was doing a lot of bending over and stooping, and now pain is worse on the R.   Patient Stated Goals "less pain and able to stand or walk for longer  peroids"   Currently in Pain? Yes   Pain Score 8    Pain Location Groin   Pain Orientation Right   Pain Descriptors / Indicators Sharp;Stabbing;Throbbing   Pain Type Acute pain   Pain Radiating Towards wrapping around to back   Pain Onset In the past 7 days   Pain Frequency Intermittent   Aggravating Factors  first getting up or taking a step   Pain Relieving Factors increased movement, rest/sitting                         [facility] Adult PT Treatment/Exercise - 06/01/17 1101      Exercises   Exercises Lumbar     Lumbar Exercises: Aerobic   Stationary Bike NuStep - lvl 5 x 6'     Lumbar Exercises: Supine   Bridge 15 reps;5 seconds   Bridge Limitations + hip ABD isometric  with green TB   Isometric Hip Flexion 10 reps;3 seconds   Isometric Hip Flexion Limitations heels on orange Pball - stopped due to cramping in B quads (initially just slight cramping reported in R quads, then more severe on L)     Lumbar Exercises: Sidelying   Clam 15 reps;3 seconds   Clam Limitations green TB  Knee/Hip Exercises: Stretches   Other Knee/Hip Stretches B hip adductor stetch (frog leg position) 2x30"     Manual Therapy   Manual therapy comments pt in slight hooklying & slight R LE ER (resting against PT)   Soft tissue mobilization R hip adductors, iliopsoas & prox quads, esp RF; L prox quads   Myofascial Release mutiple TPR to R hip adductors; L prox quads                     PT Long Term Goals - 05/11/17 1118      PT LONG TERM GOAL #1   Title Pt reports able to perform ADLs, chores, and recreational activities without limitation by low back pain or radicular symptoms   Status On-going     PT LONG TERM GOAL #2   Title Lumbar ROM WFL w/o limitation due to pain    Status On-going     PT LONG TERM GOAL #3   Title B hip and knee strength >/= 4/5 for improved stability   Status On-going     PT LONG TERM GOAL #4   Title Pt will report ability to stand or  walk for >45 minutes w/o limitation due to low back pain or radicular symtpoms   Status On-going     PT LONG TERM GOAL #5   Title Independent with ongoing HEP   Status On-going               Plan - 06/01/17 1159    Clinical Impression Statement Pt arriving today with signifcantly increased pain, primarily in R groin but wrapping around to back, after excessive bending and stooping while working on her plants on Tuesday. Several TPs and taut bands identified in R adductors which responded well to manual therapy with pt reporting signifcant reduction in pain. Exercise tolerance limited by repeated cramping episodes in hamstrings and quads, with hamstring cramps resolving w/o interverntion but STM necessary to resolve L quad cramping.   Rehab Potential Good   Clinical Impairments Affecting Rehab Potential chronic neck & B shoulder pain with h/o ACDF; L carpal & cubital tunnel syndrome; h/o B ankle surgery; still rehabing at HEP level for R shoulder pain & LOM   PT Treatment/Interventions Patient/family education;Neuromuscular re-education;ADLs/Self Care Home Management;Manual techniques;Dry needling;Taping;Therapeutic exercise;Therapeutic activities;Electrical Stimulation;Moist Heat;Cryotherapy;Iontophoresis 4mg /ml Dexamethasone;Functional mobility training   Consulted and Agree with Plan of Care Patient      Patient will benefit from skilled therapeutic intervention in order to improve the following deficits and impairments:  Pain, Increased muscle spasms, Impaired flexibility, Decreased range of motion, Decreased strength, Decreased activity tolerance, Difficulty walking  Visit Diagnosis: Chronic bilateral low back pain, with sciatica presence unspecified  Radiculopathy, lumbar region  Muscle weakness (generalized)  Difficulty in walking, not elsewhere classified     Problem List Patient Active Problem List   Diagnosis Date Noted  . Acute bronchitis 08/10/2014  . Unstable  angina ([place]) 05/29/2014  . Abnormal nuclear stress test 05/29/2014  . Chest pain 05/06/2014  . Exertional shortness of breath 05/06/2014  . Mixed hyperlipidemia 05/06/2014  . Hyperlipidemia with target LDL less than 70 08/05/2013  . HTN (hypertension) 08/05/2013  . Chest wall pain 08/05/2013  . SINUSITIS, ACUTE 04/07/2009  . HYPOTHYROIDISM 12/10/2007  . DIABETES MELLITUS, TYPE II 05/28/2007  . Obstructive sleep apnea 05/28/2007  . Coronary atherosclerosis 05/28/2007  . Seasonal and perennial allergic rhinitis 05/28/2007  . COUGH, CHRONIC 05/28/2007    Percival Spanish, PT, MPT 06/01/2017, 12:10  PM  [facility] [address]  [place], [name], [address] Phone: [phone]   Fax:  [phone]  [name] MRN: [id number] Date of Birth: July 16, 1956

## 2017-06-13 ENCOUNTER — Encounter: Payer: Self-pay | Admitting: Physical Therapy

## 2017-06-13 ENCOUNTER — Ambulatory Visit: Payer: 59 | Attending: Specialist | Admitting: Physical Therapy

## 2017-06-13 DIAGNOSIS — M6281 Muscle weakness (generalized): Secondary | ICD-10-CM | POA: Diagnosis present

## 2017-06-13 DIAGNOSIS — M545 Low back pain: Secondary | ICD-10-CM | POA: Diagnosis not present

## 2017-06-13 DIAGNOSIS — M25511 Pain in right shoulder: Secondary | ICD-10-CM

## 2017-06-13 DIAGNOSIS — R262 Difficulty in walking, not elsewhere classified: Secondary | ICD-10-CM | POA: Diagnosis present

## 2017-06-13 DIAGNOSIS — M5416 Radiculopathy, lumbar region: Secondary | ICD-10-CM

## 2017-06-13 DIAGNOSIS — M25611 Stiffness of right shoulder, not elsewhere classified: Secondary | ICD-10-CM

## 2017-06-13 DIAGNOSIS — G8929 Other chronic pain: Secondary | ICD-10-CM

## 2017-06-13 NOTE — Patient Instructions (Signed)

## 2017-06-13 NOTE — Therapy (Signed)
[facility] [address]  [place], [name], [address] Phone: [phone]   Fax:  [phone]  Physical Therapy Treatment  Patient Details  [name] MRN: [id number] Date of Birth: Jun 08, 1956 Referring Provider: Susa Day, MD   Encounter Date: 06/13/2017  PT End of Session - 06/13/17 1025    Visit Number  6    Number of Visits  12    Date for PT Re-Evaluation  06/23/17    Authorization Type  [place] - PT only    Authorization - Number of Visits  48 12 of 60 used for shoulder   12 of 60 used for shoulder   PT Start Time  1025    PT Stop Time  1100    PT Time Calculation (min)  35 min    Activity Tolerance  Patient tolerated treatment well    Behavior During Therapy  WFL for tasks assessed/performed       Past Medical History:  Diagnosis Date  . Acute bronchitis   . Anemia    hx of anemia  . Anginal pain ([place])   . Arthritis    osteo  . Coronary atherosclerosis   . Cough   . Hypertension   . Obstructive sleep apnea (adult) (pediatric)   . Type II or unspecified type diabetes mellitus without mention of complication, not stated as uncontrolled    type 2  . Unspecified hypothyroidism     Past Surgical History:  Procedure Laterality Date  . ANTERIOR FUSION CERVICAL SPINE    . TUBAL LIGATION    . WISDOM TOOTH EXTRACTION      There were no vitals filed for this visit.  Subjective Assessment - 06/13/17 1030    Subjective  Pt reports back has been doing well, but shoulder is acting up. Thinks she did too much over the weekend.    Patient Stated Goals  "less pain and able to stand or walk for longer peroids"    Currently in Pain?  Yes    Pain Score  0-No pain    Pain Location  Back    Multiple Pain Sites  Yes    Pain Score  8    Pain Location  Shoulder    Pain Orientation  Right    Pain Descriptors / Indicators  Aching    Pain Type  Acute pain;Chronic pain    Pain Onset  In  the past 7 days                      [facility] Adult PT Treatment/Exercise - 06/13/17 0001      Lumbar Exercises: Standing   Row  Both;15 reps;Theraband;Strengthening    Theraband Level (Row)  Level 3 (Green)    Row Limitations  staggered stance    Shoulder Extension  Both;15 reps;Theraband;Strengthening    Theraband Level (Shoulder Extension)  Level 3 (Green)    Shoulder Extension Limitations  staggered stance      Modalities   Modalities  Iontophoresis      Iontophoresis   Type of Iontophoresis  Dexamethasone    Location  L anterior shoulder    Dose  80 mA-min, 1.0 mL    Time  4-6 hr patch      Manual Therapy   Manual Therapy  Soft tissue mobilization;Myofascial release;Joint mobilization    Manual therapy comments  pt hooklying     Joint Mobilization  R shoulder -  grade II-III inferior glides & A/P mobs    Soft tissue mobilization  R ant & mid deltoid, pecs, R posterior capsule, R biceps    Myofascial Release  TPR to R anterior deltoid             PT Education - 06/13/17 1050    Education provided  Yes    Education Details  Ionto patch instructions, including precautions & contraindications    Person(s) Educated  Patient    Methods  Explanation;Handout    Comprehension  Verbalized understanding          PT Long Term Goals - 05/11/17 1118      PT LONG TERM GOAL #1   Title  Pt reports able to perform ADLs, chores, and recreational activities without limitation by low back pain or radicular symptoms    Status  On-going      PT LONG TERM GOAL #2   Title  Lumbar ROM WFL w/o limitation due to pain     Status  On-going      PT LONG TERM GOAL #3   Title  B hip and knee strength >/= 4/5 for improved stability    Status  On-going      PT LONG TERM GOAL #4   Title  Pt will report ability to stand or walk for >45 minutes w/o limitation due to low back pain or radicular symtpoms    Status  On-going      PT LONG TERM GOAL #5   Title  Independent  with ongoing HEP    Status  On-going            Plan - 06/13/17 1100    Clinical Impression Statement  Pt returning to PT after 12 day absence reporting back pain has been pretty well controlled, but noting R shoulder significantly flared-up since the weekend with current pain 8/10. Increased muscle tension and taut bands noted in R pecs, UT and posterior capsule, esp teres group which responded well to STM/MFR with pt noting significant reduction in pain and improved shoulder motion by end of treatment. Ionto patch placed on R anterior shoudler/deltoid to promote further reduction in pain/irritation in this area. Will plan to return emphasis to low back as of next visit and if pain remains well controlled, consider beginning transition to HEP in preparation for discharge.    Rehab Potential  Good    Clinical Impairments Affecting Rehab Potential  chronic neck & B shoulder pain with h/o ACDF; L carpal & cubital tunnel syndrome; h/o B ankle surgery; still rehabing at HEP level for R shoulder pain & LOM    PT Treatment/Interventions  Patient/family education;Neuromuscular re-education;ADLs/Self Care Home Management;Manual techniques;Dry needling;Taping;Therapeutic exercise;Therapeutic activities;Electrical Stimulation;Moist Heat;Cryotherapy;Iontophoresis 4mg /ml Dexamethasone;Functional mobility training    Consulted and Agree with Plan of Care  Patient       Patient will benefit from skilled therapeutic intervention in order to improve the following deficits and impairments:  Pain, Increased muscle spasms, Impaired flexibility, Decreased range of motion, Decreased strength, Decreased activity tolerance, Difficulty walking  Visit Diagnosis: Chronic bilateral low back pain, with sciatica presence unspecified  Radiculopathy, lumbar region  Muscle weakness (generalized)  Difficulty in walking, not elsewhere classified  Acute pain of right shoulder  Stiffness of right shoulder, not elsewhere  classified     Problem List Patient Active Problem List   Diagnosis Date Noted  . Acute bronchitis 08/10/2014  . Unstable angina ([place]) 05/29/2014  . Abnormal nuclear stress test 05/29/2014  .  Chest pain 05/06/2014  . Exertional shortness of breath 05/06/2014  . Mixed hyperlipidemia 05/06/2014  . Hyperlipidemia with target LDL less than 70 08/05/2013  . HTN (hypertension) 08/05/2013  . Chest wall pain 08/05/2013  . SINUSITIS, ACUTE 04/07/2009  . HYPOTHYROIDISM 12/10/2007  . DIABETES MELLITUS, TYPE II 05/28/2007  . Obstructive sleep apnea 05/28/2007  . Coronary atherosclerosis 05/28/2007  . Seasonal and perennial allergic rhinitis 05/28/2007  . COUGH, CHRONIC 05/28/2007    Percival Spanish, PT, MPT 06/13/2017, 1:58 PM  [facility] [address]  [place], [name], [address] Phone: [phone]   Fax:  [phone]  Name: SHARISSA BRIERLEY MRN: [id number] Date of Birth: 1955/12/15

## 2017-06-16 ENCOUNTER — Ambulatory Visit: Payer: 59 | Admitting: Physical Therapy

## 2017-06-16 ENCOUNTER — Encounter: Payer: Self-pay | Admitting: Physical Therapy

## 2017-06-16 DIAGNOSIS — M6281 Muscle weakness (generalized): Secondary | ICD-10-CM

## 2017-06-16 DIAGNOSIS — M5416 Radiculopathy, lumbar region: Secondary | ICD-10-CM

## 2017-06-16 DIAGNOSIS — M545 Low back pain: Secondary | ICD-10-CM | POA: Diagnosis not present

## 2017-06-16 DIAGNOSIS — G8929 Other chronic pain: Secondary | ICD-10-CM

## 2017-06-16 DIAGNOSIS — R262 Difficulty in walking, not elsewhere classified: Secondary | ICD-10-CM

## 2017-06-16 NOTE — Therapy (Addendum)
[facility] [address]  [place], [name], [address] Phone: [phone]   Fax:  [phone]  Physical Therapy Treatment  Patient Details  [name] MRN: [id number] Date of Birth: 1956-07-25 Referring Provider: Susa Day, MD   Encounter Date: 06/16/2017  PT End of Session - 06/16/17 1106    Visit Number  7    Number of Visits  12    Date for PT Re-Evaluation  06/23/17    Authorization Type  [place] - PT only    Authorization - Number of Visits  48 12 of 60 used for shoulder    PT Start Time  1106    PT Stop Time  1151    PT Time Calculation (min)  45 min    Activity Tolerance  Patient tolerated treatment well    Behavior During Therapy  WFL for tasks assessed/performed       Past Medical History:  Diagnosis Date  . Acute bronchitis   . Anemia    hx of anemia  . Anginal pain ([place])   . Arthritis    osteo  . Coronary atherosclerosis   . Cough   . Hypertension   . Obstructive sleep apnea (adult) (pediatric)   . Type II or unspecified type diabetes mellitus without mention of complication, not stated as uncontrolled    type 2  . Unspecified hypothyroidism     Past Surgical History:  Procedure Laterality Date  . ANTERIOR FUSION CERVICAL SPINE    . TUBAL LIGATION    . WISDOM TOOTH EXTRACTION      There were no vitals filed for this visit.  Subjective Assessment - 06/16/17 1108    Subjective  Pt reports her R shoulder is feeling much better today, just a little achy due to the weather.    Patient Stated Goals  "less pain and able to stand or walk for longer peroids"    Currently in Pain?  No/denies    Pain Score  0-No pain    Pain Onset  In the past 7 days                      [facility] Adult PT Treatment/Exercise - 06/16/17 1106      Exercises   Exercises  Lumbar      Lumbar Exercises: Aerobic   Stationary Bike  NuStep - lvl 5 x 6'      Lumbar Exercises:  Standing   Heel Raises  15 reps;3 seconds    Heel Raises Limitations  UE support on counter    Functional Squats  10 reps;3 seconds    Functional Squats Limitations  counter minsquat    Wall Slides  10 reps;3 seconds    Wall Slides Limitations  pt preferring this over squats as far as knee discomfort goes      Lumbar Exercises: Supine   Bent Knee Raise  10 reps;3 seconds    Bent Knee Raise Limitations  brace marching with green TB    Bridge  15 reps;5 seconds    Bridge Limitations  + hip ABD isometric  with green TB      Lumbar Exercises: Sidelying   Clam  15 reps;3 seconds    Clam Limitations  green TB             PT Education - 06/16/17 1151    Education provided  Yes    Education Details  HEP  update    Person(s) Educated  Patient    Methods  Explanation;Demonstration;Handout    Comprehension  Verbalized understanding;Returned demonstration;Need further instruction          PT Long Term Goals - 05/11/17 1118      PT LONG TERM GOAL #1   Title  Pt reports able to perform ADLs, chores, and recreational activities without limitation by low back pain or radicular symptoms    Status  On-going      PT LONG TERM GOAL #2   Title  Lumbar ROM WFL w/o limitation due to pain     Status  On-going      PT LONG TERM GOAL #3   Title  B hip and knee strength >/= 4/5 for improved stability    Status  On-going      PT LONG TERM GOAL #4   Title  Pt will report ability to stand or walk for >45 minutes w/o limitation due to low back pain or radicular symtpoms    Status  On-going      PT LONG TERM GOAL #5   Title  Independent with ongoing HEP    Status  On-going            Plan - 06/16/17 1110    Clinical Impression Statement  [name] reporting shoulder pain much better today, now just achy due to the weather. Existing lumbar HEP more focused toward stretching with pt reporting no concerns with these activities, therefore focused on identifying appropriate lumbopelvic  strengtheing exercises for addition to HEP. Pt somewhat limited with tolerance for standing exercises due to knee OA but able to identifiy good mix of supine and standing exercises for HEP. Will assess response to updated HEP and plan for transition to HEP over next 1-2 visits unless further issues arise.    Rehab Potential  Good    Clinical Impairments Affecting Rehab Potential  chronic neck & B shoulder pain with h/o ACDF; L carpal & cubital tunnel syndrome; h/o B ankle surgery; still rehabing at HEP level for R shoulder pain & LOM    PT Treatment/Interventions  Patient/family education;Neuromuscular re-education;ADLs/Self Care Home Management;Manual techniques;Dry needling;Taping;Therapeutic exercise;Therapeutic activities;Electrical Stimulation;Moist Heat;Cryotherapy;Iontophoresis 4mg /ml Dexamethasone;Functional mobility training    Consulted and Agree with Plan of Care  Patient       Patient will benefit from skilled therapeutic intervention in order to improve the following deficits and impairments:  Pain, Increased muscle spasms, Impaired flexibility, Decreased range of motion, Decreased strength, Decreased activity tolerance, Difficulty walking  Visit Diagnosis: Chronic bilateral low back pain, with sciatica presence unspecified  Radiculopathy, lumbar region  Muscle weakness (generalized)  Difficulty in walking, not elsewhere classified     Problem List Patient Active Problem List   Diagnosis Date Noted  . Acute bronchitis 08/10/2014  . Unstable angina ([place]) 05/29/2014  . Abnormal nuclear stress test 05/29/2014  . Chest pain 05/06/2014  . Exertional shortness of breath 05/06/2014  . Mixed hyperlipidemia 05/06/2014  . Hyperlipidemia with target LDL less than 70 08/05/2013  . HTN (hypertension) 08/05/2013  . Chest wall pain 08/05/2013  . SINUSITIS, ACUTE 04/07/2009  . HYPOTHYROIDISM 12/10/2007  . DIABETES MELLITUS, TYPE II 05/28/2007  . Obstructive sleep apnea 05/28/2007   . Coronary atherosclerosis 05/28/2007  . Seasonal and perennial allergic rhinitis 05/28/2007  . COUGH, CHRONIC 05/28/2007    Percival Spanish, PT, MPT 06/16/2017, 12:16 PM  [facility] [address]  [address] [place], [name],  [place] Phone: [phone]   Fax:  [phone]  [name] MRN: [id number] Date of Birth: July 24, 1956

## 2017-06-19 ENCOUNTER — Ambulatory Visit: Payer: 59 | Admitting: Physical Therapy

## 2017-06-20 ENCOUNTER — Encounter: Payer: Self-pay | Admitting: Physical Therapy

## 2017-06-20 ENCOUNTER — Ambulatory Visit: Payer: 59 | Admitting: Physical Therapy

## 2017-06-20 DIAGNOSIS — R262 Difficulty in walking, not elsewhere classified: Secondary | ICD-10-CM

## 2017-06-20 DIAGNOSIS — M5416 Radiculopathy, lumbar region: Secondary | ICD-10-CM

## 2017-06-20 DIAGNOSIS — M545 Low back pain: Principal | ICD-10-CM

## 2017-06-20 DIAGNOSIS — M6281 Muscle weakness (generalized): Secondary | ICD-10-CM

## 2017-06-20 DIAGNOSIS — G8929 Other chronic pain: Secondary | ICD-10-CM

## 2017-06-20 NOTE — Therapy (Addendum)
[facility] [address]  [place], [name], [address] Phone: [phone]   Fax:  [phone]  Physical Therapy Treatment  Patient Details  [name] MRN: [id number] Date of Birth: 05/06/1956 Referring Provider: Susa Day, MD   Encounter Date: 06/20/2017  PT End of Session - 06/20/17 1015    Visit Number  8    Number of Visits  12    Date for PT Re-Evaluation  06/23/17    Authorization Type  [place] - PT only    Authorization - Number of Visits  48 12 of 60 used for shoulder    PT Start Time  1015    PT Stop Time  1059    PT Time Calculation (min)  44 min    Activity Tolerance  Patient tolerated treatment well    Behavior During Therapy  WFL for tasks assessed/performed       Past Medical History:  Diagnosis Date  . Acute bronchitis   . Anemia    hx of anemia  . Anginal pain ([place])   . Arthritis    osteo  . Coronary atherosclerosis   . Cough   . Hypertension   . Obstructive sleep apnea (adult) (pediatric)   . Type II or unspecified type diabetes mellitus without mention of complication, not stated as uncontrolled    type 2  . Unspecified hypothyroidism     Past Surgical History:  Procedure Laterality Date  . ANTERIOR FUSION CERVICAL SPINE    . TUBAL LIGATION    . WISDOM TOOTH EXTRACTION      There were no vitals filed for this visit.  Subjective Assessment - 06/20/17 1024    Subjective  Pt reports back has not been hurting even after working around the house yesterday. Only pain currently is in anterior hip/groin with pt noting swelling/"bulging" in this area.    Patient Stated Goals  "less pain and able to stand or walk for longer peroids"    Currently in Pain?  Yes    Pain Score  0-No pain    Pain Location  Back    Pain Score  6    Pain Location  Groin    Pain Orientation  Right    Pain Descriptors / Indicators  Sharp    Pain Type  Acute pain    Pain Onset  More  than a month ago    Pain Frequency  Intermittent    Aggravating Factors   sit to stand transition & first few steps         [facility] PT Assessment - 06/20/17 1015      Assessment   Medical Diagnosis  Low back pain - lumbosacral DDD, lumbar spinal stenosis with neurogenic claudication    Referring Provider  Susa Day, MD    Onset Date/Surgical Date  -- > 6 months    Hand Dominance  Right    Next MD Visit  Cx'd appt on 06/05/17 - not yet rescheduled      Observation/Other Assessments   Focus on Therapeutic Outcomes (FOTO)   Lumbar spine - 94% (6% limitation)      Strength   Right Hip Flexion  4+/5    Right Hip Extension  4-/5    Right Hip External Rotation   4-/5    Right Hip Internal Rotation  4/5    Right Hip ABduction  4+/5    Right Hip ADduction  4-/5  Left Hip Flexion  4+/5    Left Hip Extension  4/5    Left Hip External Rotation  4-/5    Left Hip Internal Rotation  4/5    Left Hip ABduction  4+/5    Left Hip ADduction  4/5    Right Knee Flexion  4/5    Right Knee Extension  4+/5    Left Knee Flexion  4/5    Left Knee Extension  4+/5                  [facility] Adult PT Treatment/Exercise - 06/20/17 1015      Exercises   Exercises  Lumbar      Lumbar Exercises: Stretches   Hip Flexor Stretch  30 seconds;2 reps    Hip Flexor Stretch Limitations  lunge position with knee on Airex pad on floor    Piriformis Stretch  30 seconds;2 reps    Piriformis Stretch Limitations  figure 4 with overpressure      Lumbar Exercises: Aerobic   Stationary Bike  NuStep - lvl 6 x 6'      Modalities   Modalities  Iontophoresis      Iontophoresis   Type of Iontophoresis  Dexamethasone    Location  L anterior shoulder    Dose  80 mA-min, 1.0 mL    Time  4-6 hr patch (#2 of 6)                  PT Long Term Goals - 06/20/17 1038      PT LONG TERM GOAL #1   Title  Pt reports able to perform ADLs, chores, and recreational activities without limitation by low  back pain or radicular symptoms    Status  Achieved      PT LONG TERM GOAL #2   Title  Lumbar ROM WFL w/o limitation due to pain     Status  Achieved      PT LONG TERM GOAL #3   Title  B hip and knee strength >/= 4/5 for improved stability    Status  Partially Met      PT LONG TERM GOAL #4   Title  Pt will report ability to stand or walk for >45 minutes w/o limitation due to low back pain or radicular symtpoms    Status  Achieved      PT LONG TERM GOAL #5   Title  Independent with ongoing HEP    Status  Achieved            Plan - 06/20/17 1101    Clinical Impression Statement  [name] has demonstrated excellent progress with PT for low back pain, noting no recent pain even with increased activity, with pt very pleased with current status. Still noting occasional anterior R hip/groin pain, typically with sit to stand transitions or after walking for long periods, therefore reviewed appriopraite stretches to continue to address this with HEP. Pt feeling confident with transitioning to HEP at this point, noting she will be very busy with up coming holidays. All goals currently met with exception of strength goal as pt still 4-/5 in a few of her hip muscles. Pt in agreement with plan to transition to HEP at this time, but will keep chart open for 30 days in the event that further issues arise with low back or shoulder necessitating return to PT.    Rehab Potential  Good    Clinical Impairments Affecting Rehab Potential  chronic neck &  B shoulder pain with h/o ACDF; L carpal & cubital tunnel syndrome; h/o B ankle surgery; still rehabing at HEP level for R shoulder pain & LOM    PT Treatment/Interventions  Patient/family education;Neuromuscular re-education;ADLs/Self Care Home Management;Manual techniques;Dry needling;Taping;Therapeutic exercise;Therapeutic activities;Electrical Stimulation;Moist Heat;Cryotherapy;Iontophoresis 66m/ml Dexamethasone;Functional mobility training    PT Next  Visit Plan  30 day hold    Consulted and Agree with Plan of Care  Patient       Patient will benefit from skilled therapeutic intervention in order to improve the following deficits and impairments:  Pain, Increased muscle spasms, Impaired flexibility, Decreased range of motion, Decreased strength, Decreased activity tolerance, Difficulty walking  Visit Diagnosis: Chronic bilateral low back pain, with sciatica presence unspecified  Radiculopathy, lumbar region  Muscle weakness (generalized)  Difficulty in walking, not elsewhere classified   G-Codes - [id number]    Functional Assessment Tool Used (Outpatient Only)  Lumbar spine FOTO = 94% (6% limitation)    Functional Limitation  Mobility: Walking and moving around    Mobility: Walking and Moving Around Goal Status [phone]  At least 40 percent but less than 60 percent impaired, limited or restricted    Mobility: Walking and Moving Around Discharge Status [phone]  At least 1 percent but less than 20 percent impaired, limited or restricted       Problem List Patient Active Problem List   Diagnosis Date Noted  . Acute bronchitis 08/10/2014  . [place] 05/29/2014  . Abnormal nuclear stress test 05/29/2014  . Chest pain 05/06/2014  . Exertional shortness of breath 05/06/2014  . Mixed hyperlipidemia 05/06/2014  . Hyperlipidemia with target LDL less than 70 08/05/2013  . HTN (hypertension) 08/05/2013  . Chest wall pain 08/05/2013  . SINUSITIS, ACUTE 04/07/2009  . HYPOTHYROIDISM 12/10/2007  . DIABETES MELLITUS, TYPE II 05/28/2007  . Obstructive sleep apnea 05/28/2007  . Coronary atherosclerosis 05/28/2007  . Seasonal and perennial allergic rhinitis 05/28/2007  . COUGH, CHRONIC 05/28/2007    JPercival Spanish PT, MPT 112/11/18 3:23 PM  [facility] [place] [address]Phone: [phone]  Fax:  [phone] Name: Denise AMORINMRN: [id number] of Birth: 1Jul 30, 1957 PHYSICAL THERAPY DISCHARGE SUMMARY  Visits from Start of Care: 8  Current functional level related to goals / functional outcomes:   Refer to above clinical impression for status as of last visit on 112-06-2017 Pt was placed on hold for 30 days and has not needed to return to PT, therefore will proceed with discharge from PT for this episode.   Remaining deficits:   As above.   Education / Equipment:   HEP, Posture & bEconomist Plan: Patient agrees to discharge.  Patient goals were partially met. Patient is being discharged due to being pleased with the current functional level.  ?????    JPercival Spanish PT, MPT 07/25/17, 8:18 AM  [facility] [place] [address]Phone: [phone]  Fax:  [phone]

## 2017-06-28 DIAGNOSIS — I10 Essential (primary) hypertension: Secondary | ICD-10-CM | POA: Diagnosis not present

## 2017-06-28 DIAGNOSIS — E1149 Type 2 diabetes mellitus with other diabetic neurological complication: Secondary | ICD-10-CM | POA: Diagnosis not present

## 2017-06-28 DIAGNOSIS — R944 Abnormal results of kidney function studies: Secondary | ICD-10-CM | POA: Diagnosis not present

## 2017-06-30 ENCOUNTER — Other Ambulatory Visit: Payer: Self-pay | Admitting: Cardiovascular Disease

## 2017-07-01 DIAGNOSIS — M791 Myalgia, unspecified site: Secondary | ICD-10-CM | POA: Diagnosis not present

## 2017-07-03 ENCOUNTER — Ambulatory Visit: Payer: 59 | Admitting: Physical Therapy

## 2017-07-03 NOTE — Telephone Encounter (Signed)
Rx has been sent to the pharmacy electronically. ° °

## 2017-07-07 ENCOUNTER — Encounter: Payer: 59 | Admitting: Physical Therapy

## 2017-07-07 DIAGNOSIS — E1151 Type 2 diabetes mellitus with diabetic peripheral angiopathy without gangrene: Secondary | ICD-10-CM | POA: Diagnosis not present

## 2017-07-07 DIAGNOSIS — K219 Gastro-esophageal reflux disease without esophagitis: Secondary | ICD-10-CM | POA: Diagnosis not present

## 2017-07-07 DIAGNOSIS — R05 Cough: Secondary | ICD-10-CM | POA: Diagnosis not present

## 2017-07-14 DIAGNOSIS — J029 Acute pharyngitis, unspecified: Secondary | ICD-10-CM | POA: Diagnosis not present

## 2017-07-28 ENCOUNTER — Other Ambulatory Visit: Payer: Self-pay | Admitting: Internal Medicine

## 2017-07-31 DIAGNOSIS — M791 Myalgia, unspecified site: Secondary | ICD-10-CM | POA: Diagnosis not present

## 2017-08-31 DIAGNOSIS — J45901 Unspecified asthma with (acute) exacerbation: Secondary | ICD-10-CM | POA: Diagnosis not present

## 2017-08-31 DIAGNOSIS — R05 Cough: Secondary | ICD-10-CM | POA: Diagnosis not present

## 2017-08-31 DIAGNOSIS — J019 Acute sinusitis, unspecified: Secondary | ICD-10-CM | POA: Diagnosis not present

## 2017-08-31 DIAGNOSIS — M791 Myalgia, unspecified site: Secondary | ICD-10-CM | POA: Diagnosis not present

## 2017-08-31 DIAGNOSIS — R0609 Other forms of dyspnea: Secondary | ICD-10-CM | POA: Diagnosis not present

## 2017-09-01 DIAGNOSIS — J45901 Unspecified asthma with (acute) exacerbation: Secondary | ICD-10-CM | POA: Diagnosis not present

## 2017-09-05 DIAGNOSIS — J45901 Unspecified asthma with (acute) exacerbation: Secondary | ICD-10-CM | POA: Diagnosis not present

## 2017-09-26 DIAGNOSIS — Z794 Long term (current) use of insulin: Secondary | ICD-10-CM | POA: Diagnosis not present

## 2017-09-26 DIAGNOSIS — I10 Essential (primary) hypertension: Secondary | ICD-10-CM | POA: Diagnosis not present

## 2017-09-26 DIAGNOSIS — E1149 Type 2 diabetes mellitus with other diabetic neurological complication: Secondary | ICD-10-CM | POA: Diagnosis not present

## 2017-10-01 DIAGNOSIS — M791 Myalgia, unspecified site: Secondary | ICD-10-CM | POA: Diagnosis not present

## 2017-10-20 DIAGNOSIS — D6489 Other specified anemias: Secondary | ICD-10-CM | POA: Diagnosis not present

## 2017-10-20 DIAGNOSIS — E7849 Other hyperlipidemia: Secondary | ICD-10-CM | POA: Diagnosis not present

## 2017-10-20 DIAGNOSIS — E1151 Type 2 diabetes mellitus with diabetic peripheral angiopathy without gangrene: Secondary | ICD-10-CM | POA: Diagnosis not present

## 2017-10-20 DIAGNOSIS — E041 Nontoxic single thyroid nodule: Secondary | ICD-10-CM | POA: Diagnosis not present

## 2017-10-29 DIAGNOSIS — M791 Myalgia, unspecified site: Secondary | ICD-10-CM | POA: Diagnosis not present

## 2017-11-08 ENCOUNTER — Other Ambulatory Visit: Payer: Self-pay | Admitting: Cardiovascular Disease

## 2017-11-08 NOTE — Telephone Encounter (Signed)
REFILL 

## 2017-11-29 DIAGNOSIS — M791 Myalgia, unspecified site: Secondary | ICD-10-CM | POA: Diagnosis not present

## 2017-12-06 DIAGNOSIS — E1149 Type 2 diabetes mellitus with other diabetic neurological complication: Secondary | ICD-10-CM | POA: Diagnosis not present

## 2017-12-06 DIAGNOSIS — I10 Essential (primary) hypertension: Secondary | ICD-10-CM | POA: Diagnosis not present

## 2017-12-06 DIAGNOSIS — Z794 Long term (current) use of insulin: Secondary | ICD-10-CM | POA: Diagnosis not present

## 2017-12-22 ENCOUNTER — Other Ambulatory Visit: Payer: Self-pay | Admitting: Cardiovascular Disease

## 2017-12-26 DIAGNOSIS — E1149 Type 2 diabetes mellitus with other diabetic neurological complication: Secondary | ICD-10-CM | POA: Diagnosis not present

## 2017-12-26 DIAGNOSIS — R300 Dysuria: Secondary | ICD-10-CM | POA: Diagnosis not present

## 2017-12-26 DIAGNOSIS — I10 Essential (primary) hypertension: Secondary | ICD-10-CM | POA: Diagnosis not present

## 2017-12-29 DIAGNOSIS — M791 Myalgia, unspecified site: Secondary | ICD-10-CM | POA: Diagnosis not present

## 2018-01-17 ENCOUNTER — Other Ambulatory Visit: Payer: Self-pay | Admitting: Cardiology

## 2018-01-17 ENCOUNTER — Other Ambulatory Visit: Payer: Self-pay | Admitting: Cardiovascular Disease

## 2018-01-29 DIAGNOSIS — M791 Myalgia, unspecified site: Secondary | ICD-10-CM | POA: Diagnosis not present

## 2018-02-05 DIAGNOSIS — R591 Generalized enlarged lymph nodes: Secondary | ICD-10-CM | POA: Diagnosis not present

## 2018-02-05 DIAGNOSIS — J45901 Unspecified asthma with (acute) exacerbation: Secondary | ICD-10-CM | POA: Diagnosis not present

## 2018-02-05 DIAGNOSIS — J019 Acute sinusitis, unspecified: Secondary | ICD-10-CM | POA: Diagnosis not present

## 2018-02-14 DIAGNOSIS — Z794 Long term (current) use of insulin: Secondary | ICD-10-CM | POA: Diagnosis not present

## 2018-02-14 DIAGNOSIS — E1142 Type 2 diabetes mellitus with diabetic polyneuropathy: Secondary | ICD-10-CM | POA: Diagnosis not present

## 2018-02-14 DIAGNOSIS — E1149 Type 2 diabetes mellitus with other diabetic neurological complication: Secondary | ICD-10-CM | POA: Diagnosis not present

## 2018-02-14 DIAGNOSIS — Z1389 Encounter for screening for other disorder: Secondary | ICD-10-CM | POA: Diagnosis not present

## 2018-02-19 ENCOUNTER — Telehealth: Payer: Self-pay | Admitting: Cardiovascular Disease

## 2018-02-19 MED ORDER — CLOPIDOGREL BISULFATE 75 MG PO TABS: 75.0000 mg | ORAL_TABLET | Freq: Every day | ORAL | 1 refills | Status: DC

## 2018-02-19 NOTE — Telephone Encounter (Signed)
New Message      *STAT* If patient is at the pharmacy, call can be transferred to refill team.   1. Which medications need to be refilled? (please list name of each medication and dose if known) Plavix  2. Which pharmacy/location (including street and city if local pharmacy) is medication to be sent to?[place]  3. Do they need a 30 day or 90 day supply? [place]

## 2018-02-19 NOTE — Telephone Encounter (Signed)
Rx has been sent to the pharmacy electronically. ° °

## 2018-02-22 ENCOUNTER — Other Ambulatory Visit: Payer: Self-pay | Admitting: Cardiovascular Disease

## 2018-02-22 NOTE — Telephone Encounter (Signed)
Rx sent to pharmacy   

## 2018-02-28 DIAGNOSIS — M791 Myalgia, unspecified site: Secondary | ICD-10-CM | POA: Diagnosis not present

## 2018-03-09 ENCOUNTER — Other Ambulatory Visit: Payer: Self-pay | Admitting: Cardiovascular Disease

## 2018-03-09 NOTE — Telephone Encounter (Signed)
Rx request sent to pharmacy.  

## 2018-03-14 NOTE — Progress Notes (Signed)
Cardiology Office Note   Date:  03/15/2018   ID:  [name], [name] 1956/06/27, MRN [id number]  PCP:  Reynold Bowen, MD  Cardiologist: Dr. Claiborne Billings   Chief Complaint  Patient presents with  . Coronary Artery Disease  . Hypertension     History of Present Illness: [name] is a 62 y.o. female who presents for ongoing assessment and management of CAD with hx of DES to proximal RCA 2007, Hypertension, echo in 2017 demonstrating normal LV fx 60%-65% with trivial AR. She also has a elevated TG, COPD, OSA, Type II diabetes, and unspecified hypothyroidism. She is only taking statin on MWF to avoid myalgia symptoms. Followed by Dr. Forde Dandy   She is here without complaints. She remains active. She is followed by PCP for diabetes. She denies chest pain, dyspnea or abnormal fatigue. She is medically complaint.   Past Medical History:  Diagnosis Date  . Acute bronchitis   . Anemia    hx of anemia  . Anginal pain ([place])   . Arthritis    osteo  . Coronary atherosclerosis   . Cough   . Hypertension   . Obstructive sleep apnea (adult) (pediatric)   . Type II or unspecified type diabetes mellitus without mention of complication, not stated as uncontrolled    type 2  . Unspecified hypothyroidism     Past Surgical History:  Procedure Laterality Date  . ANTERIOR FUSION CERVICAL SPINE    . LEFT HEART CATHETERIZATION WITH CORONARY ANGIOGRAM N/A 05/29/2014   Procedure: LEFT HEART CATHETERIZATION WITH CORONARY ANGIOGRAM;  Surgeon: Sinclair Grooms, MD;  Location: [facility] CATH LAB;  Service: Cardiovascular;  Laterality: N/A;  . TUBAL LIGATION    . WISDOM TOOTH EXTRACTION       Current Outpatient Medications  Medication Sig Dispense Refill  . ADVAIR DISKUS 250-50 MCG/DOSE AEPB INHALE ONE (1) PUFF(S) TWICE DAILY, RINSE MOUTH. 60 each 4  . albuterol (PROVENTIL HFA) 108 (90 BASE) MCG/ACT inhaler Inhale 2 puffs into the lungs every 6 (six) hours as needed for wheezing or shortness of breath.  For shortness of breath and wheezing 1 Inhaler prn  . albuterol (PROVENTIL) (2.5 MG/3ML) 0.083% nebulizer solution Take 3 mLs (2.5 mg total) by nebulization every 6 (six) hours as needed for wheezing or shortness of breath. 75 mL 5  . Calcium Carbonate Antacid (ALKA-SELTZER ANTACID PO) Take by mouth daily as needed.    . clopidogrel (PLAVIX) 75 MG tablet Take 1 tablet (75 mg total) by mouth daily. 90 tablet 3  . diazepam (VALIUM) 5 MG tablet Take 0.5 tablets by mouth as needed. .5 to 1 tablet prn for muscle cramps    . diclofenac (VOLTAREN) 75 MG EC tablet Take 75 mg by mouth daily as needed. For muscle aches    . furosemide (LASIX) 40 MG tablet Take 1 tablet (40 mg total) by mouth daily as needed for fluid. 90 tablet 3  . Insulin Detemir (LEVEMIR FLEXPEN [place]) Inject 15 Units into the skin at bedtime.     . insulin lispro (HUMALOG) 100 UNIT/ML injection Inject into the skin 3 (three) times daily before meals.    Marland Kitchen ipratropium (ATROVENT) 0.02 % nebulizer solution Take 2.5 mLs (0.5 mg total) by nebulization 4 (four) times daily. 75 mL 12  . isosorbide mononitrate (IMDUR) 120 MG 24 hr tablet Take 1 tablet (120 mg total) by mouth daily. 90 tablet 3  . levothyroxine (SYNTHROID, LEVOTHROID) 200 MCG tablet Take 200 mcg by mouth daily.    Marland Kitchen  metoprolol succinate (TOPROL-XL) 25 MG 24 hr tablet Take 1 tablet (25 mg total) by mouth daily. Keep OV 30 tablet 0  . NOVOTWIST 32G X 5 MM MISC Use as directed    . omega-3 acid ethyl esters (LOVAZA) 1 g capsule TAKE TWO CAPSULES BY MOUTH TWO TIMES A DAY 120 capsule 0  . ONE TOUCH ULTRA TEST test strip Use as directed    . pantoprazole (PROTONIX) 40 MG tablet Take 40 mg by mouth daily.    . pregabalin (LYRICA) 75 MG capsule Take 75 mg by mouth 2 (two) times daily.    . rosuvastatin (CRESTOR) 5 MG tablet Take 1 tablet (5 mg total) by mouth daily. 90 tablet 3  . triamterene-hydrochlorothiazide (MAXZIDE-25) 37.5-25 MG tablet Take 1 tablet by mouth daily. 90 tablet 3  .  verapamil (CALAN-SR) 240 MG CR tablet Take 1 tablet (240 mg total) by mouth daily. 90 tablet 3   No current facility-administered medications for this visit.     Allergies:   Codeine; Levaquin [levofloxacin]; Sulfa antibiotics; and Sulfonamide derivatives    Social History:  The patient  reports that she quit smoking about 14 years ago. She has a 3.00 pack-year smoking history. She has never used smokeless tobacco. She reports that she does not drink alcohol or use drugs.   Family History:  The patient's family history includes Heart attack in her sister; Heart disease in her brother and mother; Hypertension in her mother; Prostate cancer in her father; Stroke in her mother.    ROS: All other systems are reviewed and negative. Unless otherwise mentioned in H&P    PHYSICAL EXAM: VS:  BP 122/64   Pulse 71   Ht 5\' 2"  (1.575 m)   Wt 184 lb (83.5 kg)   SpO2 97%   BMI 33.65 kg/m  , BMI Body mass index is 33.65 kg/m. GEN: Well nourished, well developed, in no acute distress  HEENT: normal  Neck: no JVD, carotid bruits, or masses Cardiac: RRR; no murmurs, rubs, or gallops,no edema  Respiratory:  clear to auscultation bilaterally, normal work of breathing GI: soft, nontender, nondistended, + BS MS: no deformity or atrophy  Skin: warm and dry, no rash Neuro:  Strength and sensation are intact Psych: euthymic mood, full affect   EKG:  Not completed this office visit.   Recent Labs: No results found for requested labs within last 8760 hours.    Lipid Panel    Component Value Date/Time   CHOL 108 05/30/2014 0400   TRIG 155 (H) 05/30/2014 0400   HDL 40 05/30/2014 0400   CHOLHDL 2.7 05/30/2014 0400   VLDL 31 05/30/2014 0400   LDLCALC 37 05/30/2014 0400      Wt Readings from Last 3 Encounters:  03/15/18 184 lb (83.5 kg)  12/19/16 182 lb (82.6 kg)  11/28/16 184 lb (83.5 kg)      Other studies Reviewed: Echocardiogram 01/20/2016 Left ventricle: The cavity size was normal.  Wall thickness was   normal. Systolic function was normal. The estimated ejection   fraction was in the range of 60% to 65%. Wall motion was normal;   there were no regional wall motion abnormalities. Doppler   parameters are consistent with abnormal left ventricular   relaxation (grade 1 diastolic dysfunction). - Aortic valve: There was trivial regurgitation.    ASSESSMENT AND PLAN:  1. CAD: Hx of DES to the proximal RCA in 2007. She is completely asymptomatic at this time. She is medically compliant. She has  not had labs drawn since 11/2016. I will order annual labs today. She is given refills on her cardiac medications.   2. Hypertension: Blood pressure is well controlled.  BMET is ordered for evaluation of kidney function.    3. Thyroid disease: Will check TSH on labs. Followed by PCP  4. Diabetes: She has been taken off of metformin and is now only on insulin. Consider Januvia for cardioprotection.   5. Hypercholesterolemia: Continue Crestor. Labs are pending.    Current medicines are reviewed at length with the patient today.    Labs/ tests ordered today include: TSH, Hgb A1c, CBC, Lipids and LFT's and BMET.   Phill Myron. West Pugh, ANP, AACC   03/15/2018 11:34 AM    [facility] [address], [place], [place] [address] Phone: [phone]; Fax: [phone]

## 2018-03-15 ENCOUNTER — Encounter: Payer: Self-pay | Admitting: Adult Health

## 2018-03-15 ENCOUNTER — Ambulatory Visit (INDEPENDENT_AMBULATORY_CARE_PROVIDER_SITE_OTHER): Payer: 59 | Admitting: Adult Health

## 2018-03-15 VITALS — BP 122/64 | HR 71 | Ht 62.0 in | Wt 184.0 lb

## 2018-03-15 DIAGNOSIS — E118 Type 2 diabetes mellitus with unspecified complications: Secondary | ICD-10-CM

## 2018-03-15 DIAGNOSIS — E7800 Pure hypercholesterolemia, unspecified: Secondary | ICD-10-CM | POA: Diagnosis not present

## 2018-03-15 DIAGNOSIS — I2510 Atherosclerotic heart disease of native coronary artery without angina pectoris: Secondary | ICD-10-CM

## 2018-03-15 DIAGNOSIS — I10 Essential (primary) hypertension: Secondary | ICD-10-CM | POA: Diagnosis not present

## 2018-03-15 DIAGNOSIS — Z79899 Other long term (current) drug therapy: Secondary | ICD-10-CM | POA: Diagnosis not present

## 2018-03-15 DIAGNOSIS — Z794 Long term (current) use of insulin: Secondary | ICD-10-CM

## 2018-03-15 MED ORDER — FUROSEMIDE 40 MG PO TABS: 40.0000 mg | ORAL_TABLET | Freq: Every day | ORAL | 3 refills | Status: DC | PRN

## 2018-03-15 MED ORDER — TRIAMTERENE-HCTZ 37.5-25 MG PO TABS: 1.0000 | ORAL_TABLET | Freq: Every day | ORAL | 3 refills | Status: DC

## 2018-03-15 MED ORDER — ROSUVASTATIN CALCIUM 5 MG PO TABS: 5.0000 mg | ORAL_TABLET | Freq: Every day | ORAL | 3 refills | Status: DC

## 2018-03-15 MED ORDER — ISOSORBIDE MONONITRATE ER 120 MG PO TB24: 120.0000 mg | ORAL_TABLET | Freq: Every day | ORAL | 3 refills | Status: DC

## 2018-03-15 MED ORDER — VERAPAMIL HCL ER 240 MG PO TBCR: 240.0000 mg | EXTENDED_RELEASE_TABLET | Freq: Every day | ORAL | 3 refills | Status: DC

## 2018-03-15 MED ORDER — CLOPIDOGREL BISULFATE 75 MG PO TABS: 75.0000 mg | ORAL_TABLET | Freq: Every day | ORAL | 3 refills | Status: DC

## 2018-03-15 NOTE — Patient Instructions (Signed)
Medication Instructions:  NO CHANGES- Your physician recommends that you continue on your current medications as directed. Please refer to the Current Medication list given to you today.  If you need a refill on your cardiac medications before your next appointment, please call your pharmacy.  Labwork: A1C,BMET,CBC,FLP AND LFT TODAY  HERE IN OUR OFFICE AT [facility]  Take the provided lab slips with you to the lab for your blood draw.   Follow-Up: Your physician wants you to follow-up in: [place] should receive a reminder letter in the mail two months in advance. If you do not receive a letter, please call our office June 2020 to schedule the AUG 2020 follow-up appointment.   Thank you for choosing [facility] at [facility]!!

## 2018-03-19 DIAGNOSIS — H2513 Age-related nuclear cataract, bilateral: Secondary | ICD-10-CM | POA: Diagnosis not present

## 2018-03-19 DIAGNOSIS — E119 Type 2 diabetes mellitus without complications: Secondary | ICD-10-CM | POA: Diagnosis not present

## 2018-03-19 DIAGNOSIS — H04123 Dry eye syndrome of bilateral lacrimal glands: Secondary | ICD-10-CM | POA: Diagnosis not present

## 2018-03-28 DIAGNOSIS — Z79899 Other long term (current) drug therapy: Secondary | ICD-10-CM | POA: Diagnosis not present

## 2018-03-28 DIAGNOSIS — Z794 Long term (current) use of insulin: Secondary | ICD-10-CM | POA: Diagnosis not present

## 2018-03-28 DIAGNOSIS — E1149 Type 2 diabetes mellitus with other diabetic neurological complication: Secondary | ICD-10-CM | POA: Diagnosis not present

## 2018-03-28 DIAGNOSIS — I10 Essential (primary) hypertension: Secondary | ICD-10-CM | POA: Diagnosis not present

## 2018-03-29 ENCOUNTER — Telehealth: Payer: Self-pay | Admitting: Cardiovascular Disease

## 2018-03-29 LAB — CBC
Hematocrit: 35.4 % (ref 34.0–46.6)
Hemoglobin: 10.9 g/dL — ABNORMAL LOW (ref 11.1–15.9)
MCH: 23.8 pg — ABNORMAL LOW (ref 26.6–33.0)
MCHC: 30.8 g/dL — ABNORMAL LOW (ref 31.5–35.7)
MCV: 77 fL — ABNORMAL LOW (ref 79–97)
PLATELETS: 312 10*3/uL (ref 150–450)
RBC: 4.58 x10E6/uL (ref 3.77–5.28)
RDW: 17 % — AB (ref 12.3–15.4)
WBC: 7.3 10*3/uL (ref 3.4–10.8)

## 2018-03-29 LAB — BASIC METABOLIC PANEL
BUN / CREAT RATIO: 14 (ref 12–28)
BUN: 27 mg/dL (ref 8–27)
CALCIUM: 8.8 mg/dL (ref 8.7–10.3)
CHLORIDE: 99 mmol/L (ref 96–106)
CO2: 23 mmol/L (ref 20–29)
Creatinine, Ser: 1.97 mg/dL — ABNORMAL HIGH (ref 0.57–1.00)
GFR calc non Af Amer: 27 mL/min/{1.73_m2} — ABNORMAL LOW (ref >59)
GFR, EST AFRICAN AMERICAN: 31 mL/min/{1.73_m2} — AB (ref >59)
Glucose: 86 mg/dL (ref 65–99)
POTASSIUM: 3.5 mmol/L (ref 3.5–5.2)
Sodium: 140 mmol/L (ref 134–144)

## 2018-03-29 LAB — HEPATIC FUNCTION PANEL
ALT: 15 IU/L (ref 0–32)
AST: 24 IU/L (ref 0–40)
Albumin: 4.2 g/dL (ref 3.6–4.8)
Alkaline Phosphatase: 101 IU/L (ref 39–117)
Bilirubin Total: 0.2 mg/dL (ref 0.0–1.2)
Bilirubin, Direct: 0.09 mg/dL (ref 0.00–0.40)
TOTAL PROTEIN: 8 g/dL (ref 6.0–8.5)

## 2018-03-29 LAB — TSH: TSH: 1.83 u[IU]/mL (ref 0.450–4.500)

## 2018-03-29 LAB — LIPID PANEL
CHOL/HDL RATIO: 3.7 ratio (ref 0.0–4.4)
Cholesterol, Total: 144 mg/dL (ref 100–199)
HDL: 39 mg/dL — AB (ref >39)
LDL CALC: 64 mg/dL (ref 0–99)
TRIGLYCERIDES: 207 mg/dL — AB (ref 0–149)
VLDL CHOLESTEROL CAL: 41 mg/dL — AB (ref 5–40)

## 2018-03-29 LAB — HEMOGLOBIN A1C
Est. average glucose Bld gHb Est-mCnc: 148 mg/dL
HEMOGLOBIN A1C: 6.8 % — AB (ref 4.8–5.6)

## 2018-03-29 NOTE — Telephone Encounter (Signed)
See result note.  

## 2018-03-29 NOTE — Telephone Encounter (Signed)
Follow up:   Patient returning back call concerning labs please call patient.

## 2018-03-31 DIAGNOSIS — M791 Myalgia, unspecified site: Secondary | ICD-10-CM | POA: Diagnosis not present

## 2018-04-04 ENCOUNTER — Other Ambulatory Visit: Payer: Self-pay | Admitting: Cardiovascular Disease

## 2018-04-05 NOTE — Telephone Encounter (Signed)
Rx sent to pharmacy   

## 2018-04-17 DIAGNOSIS — J441 Chronic obstructive pulmonary disease with (acute) exacerbation: Secondary | ICD-10-CM | POA: Diagnosis not present

## 2018-04-24 IMAGING — DX DG CHEST 2V
2 series · 2 of 2 positions shown · non-contrast
Comparison: Chest radiograph 12/08/2015

CLINICAL DATA: 61-year-old female with shortness of breath and back
pain.

EXAM:
CHEST  2 VIEW

[chest pa]
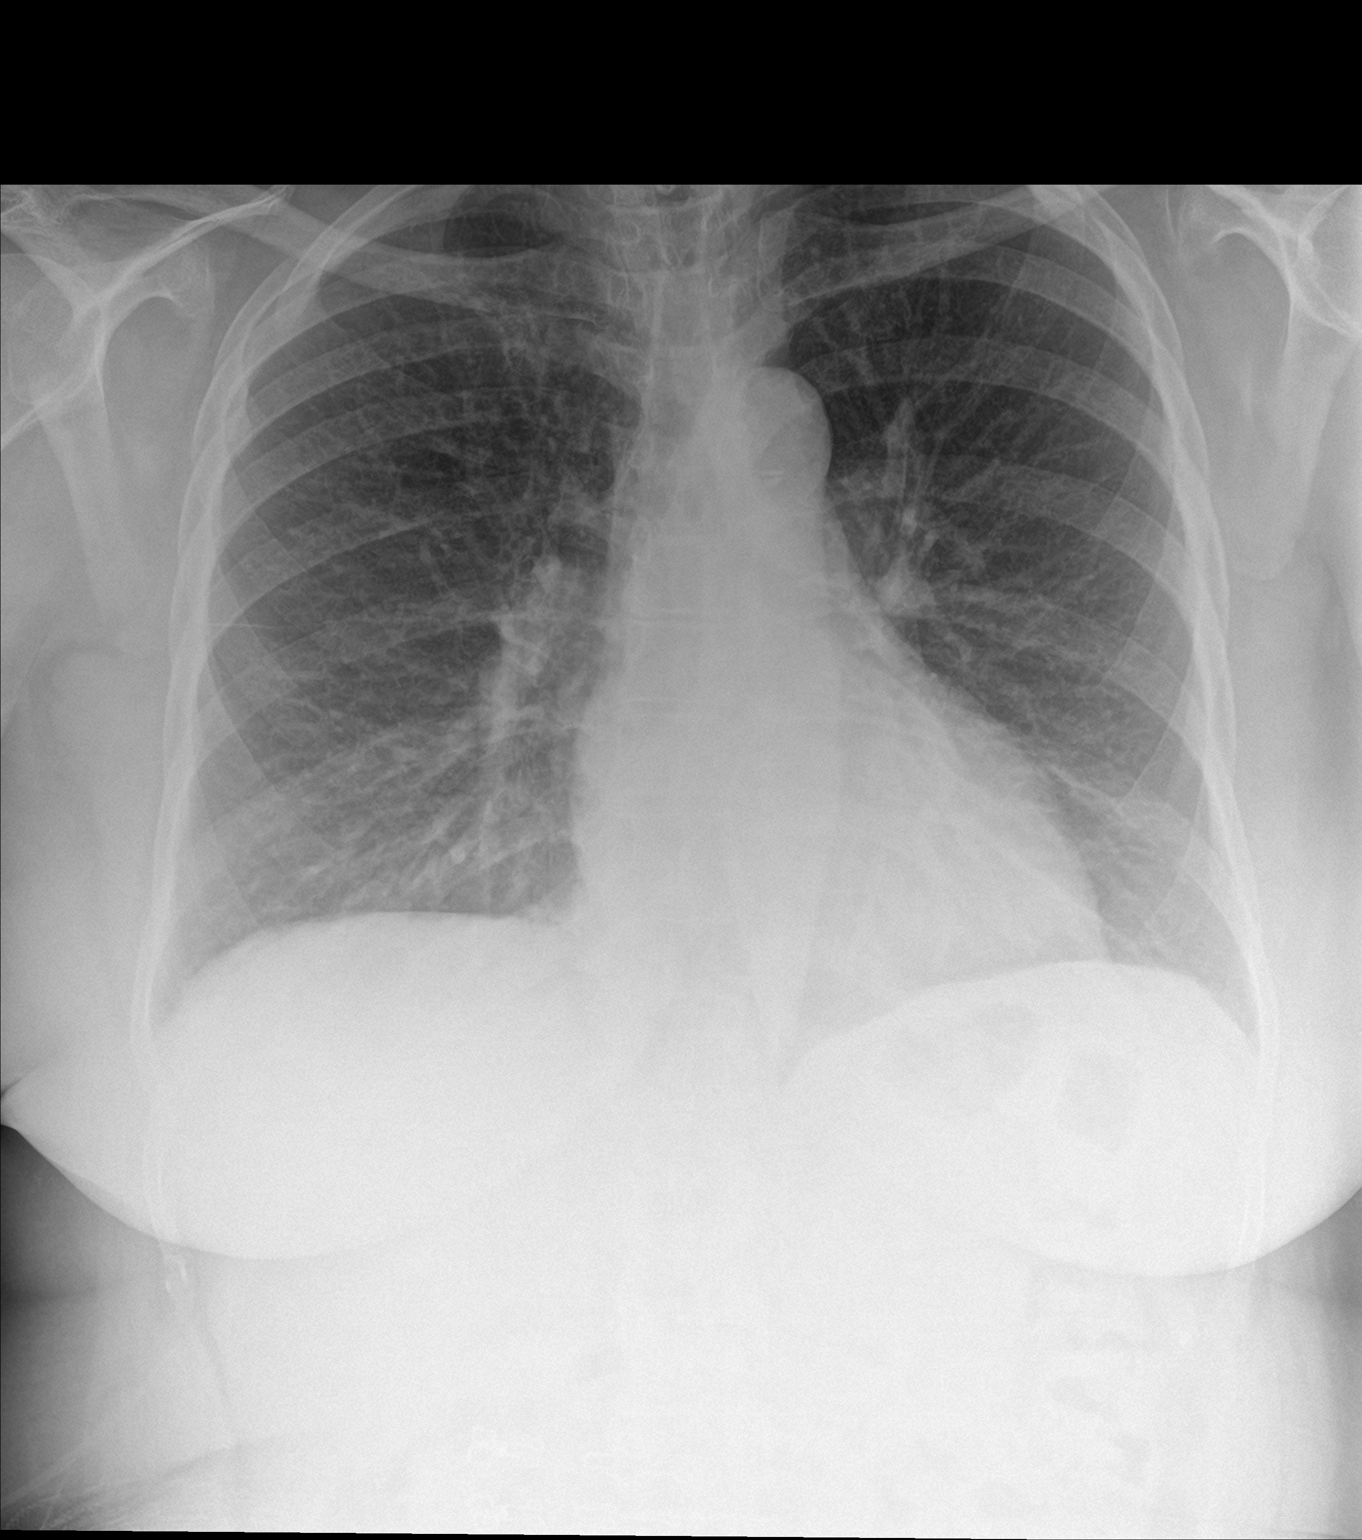

[chest lat]
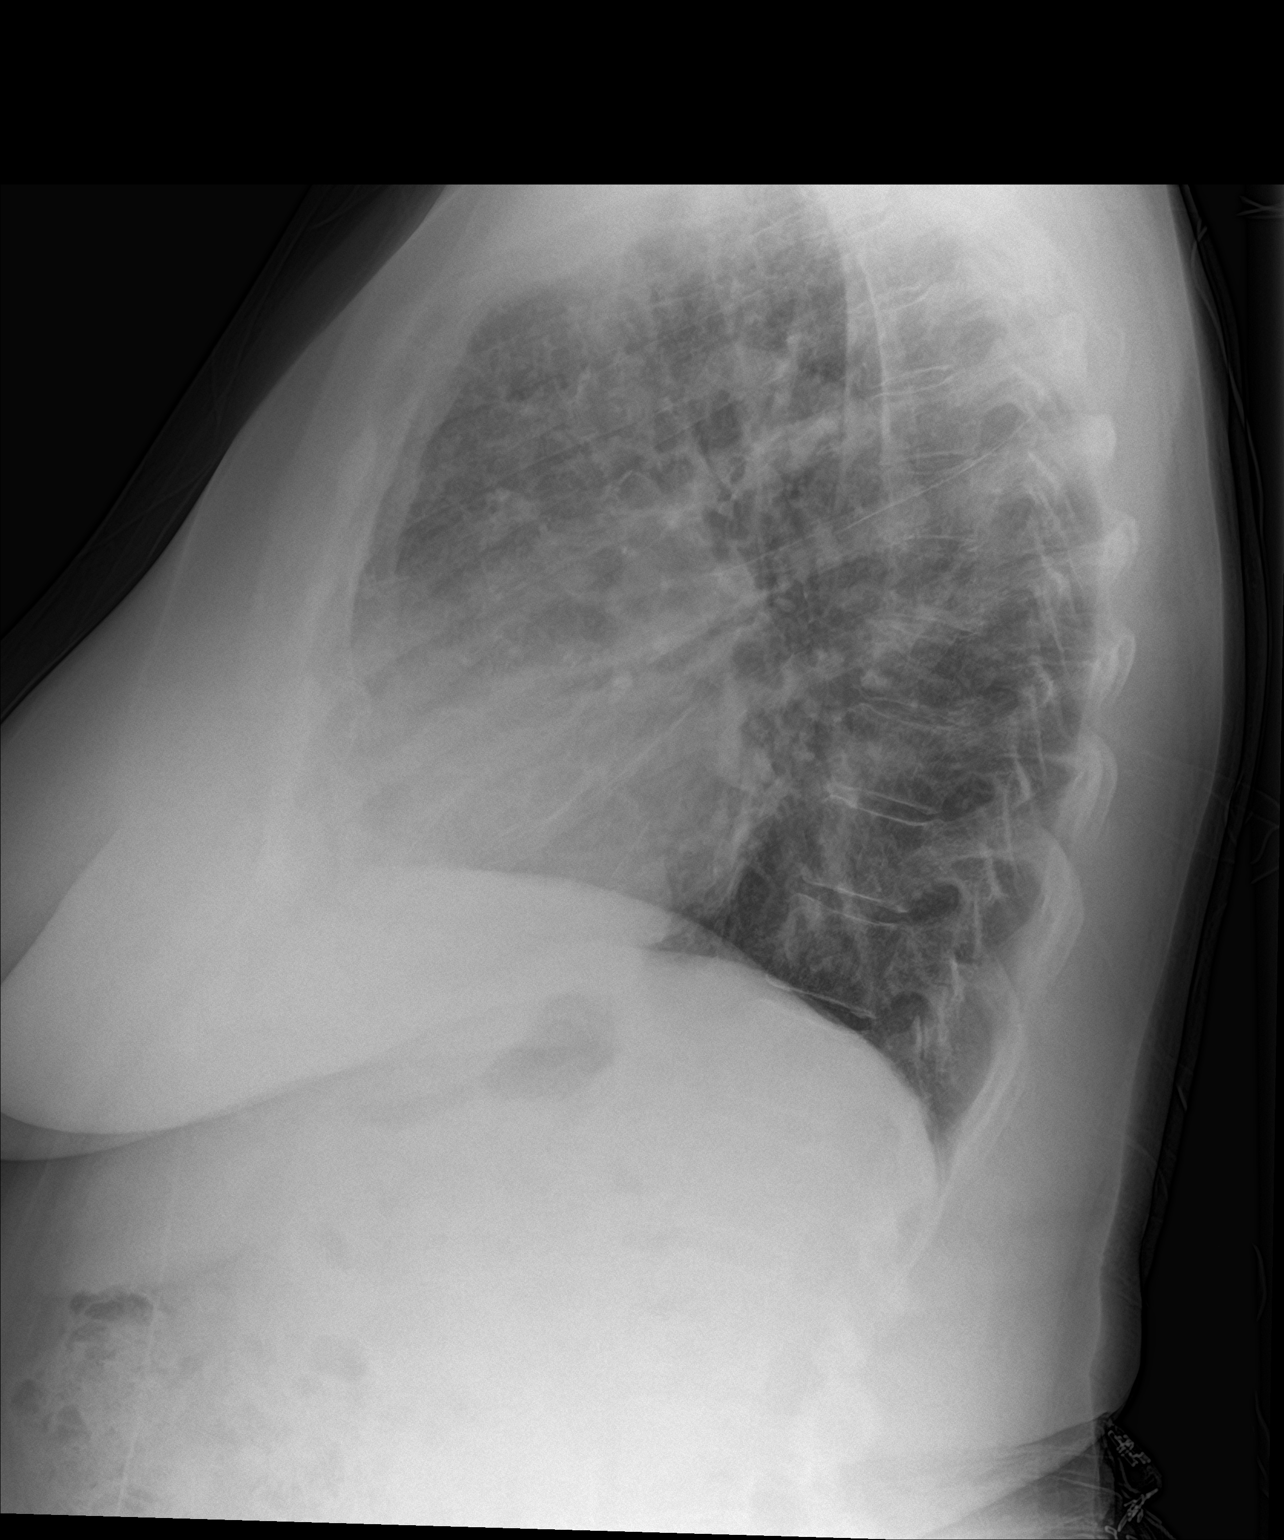

[2 of 2 positions shown; findings below may reference images not displayed]

FINDINGS: The lungs are clear. There is no pleural effusion or pneumothorax
the cardiac silhouette is within normal limits the cervical fusion
plate and noted. No acute osseous pathology.
IMPRESSION: No active cardiopulmonary disease.

## 2018-05-12 ENCOUNTER — Other Ambulatory Visit: Payer: Self-pay | Admitting: Cardiovascular Disease

## 2018-05-17 DIAGNOSIS — Z23 Encounter for immunization: Secondary | ICD-10-CM | POA: Diagnosis not present

## 2018-05-22 DIAGNOSIS — Z23 Encounter for immunization: Secondary | ICD-10-CM | POA: Diagnosis not present

## 2018-05-24 DIAGNOSIS — R079 Chest pain, unspecified: Secondary | ICD-10-CM | POA: Diagnosis not present

## 2018-05-24 DIAGNOSIS — J449 Chronic obstructive pulmonary disease, unspecified: Secondary | ICD-10-CM | POA: Diagnosis not present

## 2018-05-24 DIAGNOSIS — R0602 Shortness of breath: Secondary | ICD-10-CM | POA: Diagnosis not present

## 2018-05-24 DIAGNOSIS — Z87891 Personal history of nicotine dependence: Secondary | ICD-10-CM | POA: Diagnosis not present

## 2018-05-31 DIAGNOSIS — M791 Myalgia, unspecified site: Secondary | ICD-10-CM | POA: Diagnosis not present

## 2018-06-12 DIAGNOSIS — R05 Cough: Secondary | ICD-10-CM | POA: Diagnosis not present

## 2018-06-12 DIAGNOSIS — J209 Acute bronchitis, unspecified: Secondary | ICD-10-CM | POA: Diagnosis not present

## 2018-06-12 DIAGNOSIS — J029 Acute pharyngitis, unspecified: Secondary | ICD-10-CM | POA: Diagnosis not present

## 2018-07-04 DIAGNOSIS — E1149 Type 2 diabetes mellitus with other diabetic neurological complication: Secondary | ICD-10-CM | POA: Diagnosis not present

## 2018-07-04 DIAGNOSIS — I10 Essential (primary) hypertension: Secondary | ICD-10-CM | POA: Diagnosis not present

## 2018-07-04 DIAGNOSIS — N183 Chronic kidney disease, stage 3 (moderate): Secondary | ICD-10-CM | POA: Diagnosis not present

## 2018-07-04 DIAGNOSIS — M1A0790 Idiopathic chronic gout, unspecified ankle and foot, without tophus (tophi): Secondary | ICD-10-CM | POA: Diagnosis not present

## 2018-07-17 DIAGNOSIS — J449 Chronic obstructive pulmonary disease, unspecified: Secondary | ICD-10-CM | POA: Diagnosis not present

## 2018-08-17 DIAGNOSIS — J441 Chronic obstructive pulmonary disease with (acute) exacerbation: Secondary | ICD-10-CM | POA: Diagnosis not present

## 2018-08-23 DIAGNOSIS — N183 Chronic kidney disease, stage 3 (moderate): Secondary | ICD-10-CM | POA: Diagnosis not present

## 2018-08-23 DIAGNOSIS — I10 Essential (primary) hypertension: Secondary | ICD-10-CM | POA: Diagnosis not present

## 2018-08-23 DIAGNOSIS — E1149 Type 2 diabetes mellitus with other diabetic neurological complication: Secondary | ICD-10-CM | POA: Diagnosis not present

## 2018-11-12 DIAGNOSIS — E1151 Type 2 diabetes mellitus with diabetic peripheral angiopathy without gangrene: Secondary | ICD-10-CM | POA: Diagnosis not present

## 2018-11-12 DIAGNOSIS — N183 Chronic kidney disease, stage 3 (moderate): Secondary | ICD-10-CM | POA: Diagnosis not present

## 2018-11-12 DIAGNOSIS — I2510 Atherosclerotic heart disease of native coronary artery without angina pectoris: Secondary | ICD-10-CM | POA: Diagnosis not present

## 2018-12-21 ENCOUNTER — Telehealth: Payer: Self-pay

## 2018-12-21 NOTE — Telephone Encounter (Signed)
LVM to call back if pt would like to be seen sooner than June. Open slots available to see [name] on Wednesday, 12/26/18 at 11:30 AM and 2:00 PM.

## 2018-12-24 ENCOUNTER — Telehealth: Payer: 59 | Admitting: Cardiovascular Disease

## 2018-12-24 DIAGNOSIS — H109 Unspecified conjunctivitis: Secondary | ICD-10-CM | POA: Diagnosis not present

## 2019-01-28 ENCOUNTER — Telehealth: Payer: Self-pay | Admitting: Cardiovascular Disease

## 2019-01-28 NOTE — Telephone Encounter (Signed)
Mychart, smartphone, consent, pre reg complete 01/28/19 AF

## 2019-01-31 ENCOUNTER — Telehealth: Payer: Self-pay | Admitting: Cardiovascular Disease

## 2019-01-31 NOTE — Telephone Encounter (Signed)
LM for pt to inform that her appointment with Dr. Claiborne Billings on Monday, 02/04/19 at 2:40 PM has been changed to an [place] visit. Informed pt that she will need to where a mask when she comes in for her appt. Informed if she does not have a mask, we will provide one for her before entering the office. Also informed we are not allowing visitors at this time unless it is necessary for her.

## 2019-02-01 ENCOUNTER — Telehealth: Payer: Self-pay | Admitting: Cardiovascular Disease

## 2019-02-01 NOTE — Telephone Encounter (Signed)

## 2019-02-04 ENCOUNTER — Other Ambulatory Visit: Payer: Self-pay

## 2019-02-04 ENCOUNTER — Ambulatory Visit (INDEPENDENT_AMBULATORY_CARE_PROVIDER_SITE_OTHER): Payer: 59 | Admitting: Cardiovascular Disease

## 2019-02-04 VITALS — BP 126/62 | HR 88 | Temp 98.2°F | Ht 62.0 in | Wt 187.0 lb

## 2019-02-04 DIAGNOSIS — R0602 Shortness of breath: Secondary | ICD-10-CM | POA: Diagnosis not present

## 2019-02-04 DIAGNOSIS — Z794 Long term (current) use of insulin: Secondary | ICD-10-CM

## 2019-02-04 DIAGNOSIS — I2510 Atherosclerotic heart disease of native coronary artery without angina pectoris: Secondary | ICD-10-CM | POA: Diagnosis not present

## 2019-02-04 DIAGNOSIS — E118 Type 2 diabetes mellitus with unspecified complications: Secondary | ICD-10-CM

## 2019-02-04 DIAGNOSIS — K219 Gastro-esophageal reflux disease without esophagitis: Secondary | ICD-10-CM

## 2019-02-04 DIAGNOSIS — E785 Hyperlipidemia, unspecified: Secondary | ICD-10-CM

## 2019-02-04 DIAGNOSIS — I10 Essential (primary) hypertension: Secondary | ICD-10-CM | POA: Diagnosis not present

## 2019-02-04 DIAGNOSIS — J4520 Mild intermittent asthma, uncomplicated: Secondary | ICD-10-CM

## 2019-02-04 NOTE — Patient Instructions (Signed)
Medication Instructions:  Increase Metoprolol to 1.5 tablet for a few weeks, if pulse still high take two tablets.  If you need a refill on your cardiac medications before your next appointment, please call your pharmacy.   Testing/Procedures: Echocardiogram - Your physician has requested that you have an echocardiogram. Echocardiography is a painless test that uses sound waves to create images of your heart. It provides your doctor with information about the size and shape of your heart and how well your heart's chambers and valves are working. This procedure takes approximately one hour. There are no restrictions for this procedure. This will be performed at our [facility] location - [address].   Follow-Up: At [facility], you and your health needs are our priority.  As part of our continuing mission to provide you with exceptional heart care, we have created designated Provider Care Teams.  These Care Teams include your primary Cardiologist (physician) and Advanced Practice Providers (APPs -  Physician Assistants and Nurse Practitioners) who all work together to provide you with the care you need, when you need it. You will need a follow up appointment in 3 months. You may see Dr.Kelly or one of the following Advanced Practice Providers on your designated Care Team: Almyra Deforest, [place] . Fabian Sharp, PA-C

## 2019-02-04 NOTE — Progress Notes (Signed)
Patient ID: [name], female   DOB: 1956-05-22, 63 y.o.   MRN: [id number]      PCP: Dr. Forde Dandy  HPI: [name] is a 63 y.o. female who presents to the office for a 25 month cardiology evaluation.   [name] has known coronary artery disease and in 2007 underwent insertion of a 3.0x13 mm Cypher DES stent to her proximal RCA. She did have concomitant CAD with 60-70% stenosis of her LAD and 20% circumflex stenosis. In July 2010 she develop recurrent chest pain and repeat catheterization showed 60% LAD stenosis, 20-30% circumflex stenosis and a widely patent RCA stent.  She has a history of hypertension, hyperlipidemia, hypothyroidism, diabetes mellitus, asthma, GERD, as well as intermittent leg swelling.  When I saw her in December, 2014, she had experienced some episodes of chest pain, which sounded musculoskeletal.  However, with her cardiac risk factor profile, and known CAD .  I recommended a nuclear stress test, but she did not have that done.  I last saw her in September 2015 and ultimately recommended that she undergo the nuclear stress study.  This was interpreted as a high risk study revealing a large moderate intensity reversible distal anterior apical and inferior defect.  As result, she underwent cardiac catheterization in October 2015.  Her catheterization findings were essentially unchanged from previously and showed a widely patent RCA stent.  There was segmental 50% proximal to mid LAD stenoses with an FFR of 0.94.  There were irregularities in the circumflex.  She had normal LV function.  She sees Dr. Forde Dandy and had laboratory in January 2017.  This was notable in that her cholesterol was 191, triglycerides 267, HDL 49, and LDL 89.  Gross was elevated at 190.  Her TSH was 0.11 and free T4 1 0.5.  Subsequent blood work in May 2017 showed a BUN of 24, creatinine 1.19.  Hemoglobin 10.9, hematocrit 34.0.  On 01/06/2016 she underwent an echo Doppler study which showed an  ejection fraction of 60-65% with grade 1 diastolic dysfunction.  There was trivial AR.  When I last saw her, she denied  any episodes of exertional chest tightness, but had rare episodes of chest wall discomfort which improves with aspirin.  She had occasional swelling of her ankles.  She also has a history of intermittent wheezing for which he takes Advair.  Laboratory by Dr. Forde Dandy had revealed significant triglyceride elevation and I recommended lovasa at 2 capsules twice a day.    I last saw her in May 2018.  At that time, she stated that she had experienced occasional episodes of wheezing in the winter months.  She has sleep apnea and is on CPAP.  She denied any episodes of chest pressure.  She was unaware of palpitations.  She went to the emergency room on 11/28/2016 with shortness of breath.  She was given a breathing treatment in the emergency room with moderate relief of her symptoms.  She was felt to have mild asthma with exacerbation.  Laboratory  showed stable renal function with a creatinine 1.13.  She was anemic and is being followed by Dr. Forde Dandy with her hemoglobin of 9.3, hematocrit of 29.8.  BNP was normal at 59.9.   Since I last saw her, she has been without recurrent anginal symptomatology.  However, she has noticed increasing shortness of breath over the past 6 to 7 months.  She denied any episodes of chest pain.  Her shortness of breath typically occurs when she is  walking up steps fast.  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  She had recently seen Dr. Forde Dandy 2 weeks previously and her hemoglobin A1c was 6.6.  She presents for evaluation.   Past Medical History:  Diagnosis Date   Acute bronchitis    Anemia    hx of anemia   Anginal pain (HCC)    Arthritis    osteo   Coronary atherosclerosis    Cough    Hypertension    Obstructive sleep apnea (adult) (pediatric)    Type II or unspecified type diabetes mellitus without mention of complication, not  stated as uncontrolled    type 2   Unspecified hypothyroidism     Past Surgical History:  Procedure Laterality Date   ANTERIOR FUSION CERVICAL SPINE     LEFT HEART CATHETERIZATION WITH CORONARY ANGIOGRAM N/A 05/29/2014   Procedure: LEFT HEART CATHETERIZATION WITH CORONARY ANGIOGRAM;  Surgeon: Sinclair Grooms, MD;  Location: [facility] CATH LAB;  Service: Cardiovascular;  Laterality: N/A;   TUBAL LIGATION     WISDOM TOOTH EXTRACTION      Allergies  Allergen Reactions   Codeine Other (See Comments)    REACTION: hallucinations/"loopy"   Levaquin [Levofloxacin] Nausea And Vomiting   Sulfa Antibiotics Hives   Sulfonamide Derivatives Hives    Current Outpatient Medications  Medication Sig Dispense Refill   albuterol (PROVENTIL HFA) 108 (90 BASE) MCG/ACT inhaler Inhale 2 puffs into the lungs every 6 (six) hours as needed for wheezing or shortness of breath. For shortness of breath and wheezing 1 Inhaler prn   BREO ELLIPTA 200-25 MCG/INH AEPB Inhale 1 puff into the lungs 2 (two) times daily.     Calcium Carbonate Antacid (ALKA-SELTZER ANTACID PO) Take by mouth daily as needed.     clopidogrel (PLAVIX) 75 MG tablet Take 1 tablet (75 mg total) by mouth daily. 90 tablet 3   diazepam (VALIUM) 5 MG tablet Take 0.5 tablets by mouth as needed. .5 to 1 tablet prn for muscle cramps     diclofenac (VOLTAREN) 75 MG EC tablet Take 75 mg by mouth daily as needed. For muscle aches     furosemide (LASIX) 40 MG tablet Take 1 tablet (40 mg total) by mouth daily as needed for fluid. 90 tablet 3   Insulin Glargine (BASAGLAR KWIKPEN) 100 UNIT/ML SOPN Inject 35 Units into the skin daily.     insulin lispro (HUMALOG) 100 UNIT/ML injection Inject 5 Units into the skin daily as needed for high blood sugar (sliding scale).      isosorbide mononitrate (IMDUR) 120 MG 24 hr tablet Take 1 tablet (120 mg total) by mouth daily. 90 tablet 3   levothyroxine (SYNTHROID, LEVOTHROID) 200 MCG tablet Take 200 mcg  by mouth daily.     metoprolol succinate (TOPROL-XL) 25 MG 24 hr tablet TAKE ONE TABLET BY MOUTH DAILY - PLEASE CALL OFFICE FOR VISIT 30 tablet 10   NOVOTWIST 32G X 5 MM MISC Use as directed     omega-3 acid ethyl esters (LOVAZA) 1 g capsule TAKE TWO CAPSULES BY MOUTH TWO TIMES A DAY 120 capsule 0   ONE TOUCH ULTRA TEST test strip Use as directed     pantoprazole (PROTONIX) 40 MG tablet Take 40 mg by mouth daily.     pregabalin (LYRICA) 75 MG capsule Take 75 mg by mouth 2 (two) times daily.     rosuvastatin (CRESTOR) 5 MG tablet Take 1 tablet (5 mg total) by mouth daily. 90 tablet  3   triamterene-hydrochlorothiazide (MAXZIDE-25) 37.5-25 MG tablet Take 1 tablet by mouth daily. 90 tablet 3   verapamil (CALAN-SR) 240 MG CR tablet Take 1 tablet (240 mg total) by mouth daily. 90 tablet 3   VICTOZA 18 MG/3ML SOPN Inject 1.8 mg into the skin daily.      No current facility-administered medications for this visit.     Social History   Socioeconomic History   Marital status: Widowed    Spouse name: Not on file   Number of children: Not on file   Years of education: Not on file   Highest education level: Not on file  Occupational History   Occupation: retired  Scientist, product/process development strain: Not on file   Food insecurity    Worry: Not on file    Inability: Not on file   Transportation needs    Medical: Not on file    Non-medical: Not on file  Tobacco Use   Smoking status: Former Smoker    Packs/day: 0.10    Years: 30.00    Pack years: 3.00    Quit date: 08/09/2003    Years since quitting: 15.5   Smokeless tobacco: Never Used  Substance and Sexual Activity   Alcohol use: No    Alcohol/week: 0.0 standard drinks   Drug use: No   Sexual activity: Not on file  Lifestyle   Physical activity    Days per week: Not on file    Minutes per session: Not on file   Stress: Not on file  Relationships   Social connections    Talks on phone: Not on file     Gets together: Not on file    Attends religious service: Not on file    Active member of club or organization: Not on file    Attends meetings of clubs or organizations: Not on file    Relationship status: Not on file   Intimate partner violence    Fear of current or ex partner: Not on file    Emotionally abused: Not on file    Physically abused: Not on file    Forced sexual activity: Not on file  Other Topics Concern   Not on file  Social History Narrative   Not on file   Social she is widowed. She does try to be active. There is no tobacco use. She does drink occasional alcohol.  Family History  Problem Relation Age of Onset   Hypertension Mother    Heart disease Mother    Stroke Mother    Prostate cancer Father    Heart disease Brother    Heart attack Sister    ROS General: Negative; No fevers, chills, or night sweats;  HEENT: Negative; No changes in vision or hearing, sinus congestion, difficulty swallowing Pulmonary: Negative; No cough, wheezing, shortness of breath, hemoptysis Cardiovascular: Negative; No chest pain, presyncope, syncope, palpitations Positive for rare, intermittent leg swelling GI: Positive for GERD; No nausea, vomiting, diarrhea, or abdominal pain GU: Negative; No dysuria, hematuria, or difficulty voiding Musculoskeletal: Positive for arthritis in her knees and hips; no myalgias, joint pain, or weakness Hematologic/Oncology: Negative; no easy bruising, bleeding Endocrine: Positive for hypothyroidism, and type 2 diabetes mellitus; Neuro: Negative; no changes in balance, headaches Skin: Negative; No rashes or skin lesions Psychiatric: Negative; No behavioral problems, depression Sleep: Negative; No snoring, daytime sleepiness, hypersomnolence, bruxism, restless legs, hypnogognic hallucinations, no cataplexy Other comprehensive 14 point system review is negative.   PE BP 126/62  Pulse 88    Temp 98.2 F (36.8 C) (Temporal)    Ht 5' 2"   (1.575 m)    Wt 187 lb (84.8 kg)    SpO2 96%    BMI 34.20 kg/m    Repeat blood pressure by me 132/70  Wt Readings from Last 3 Encounters:  02/04/19 187 lb (84.8 kg)  03/15/18 184 lb (83.5 kg)  12/19/16 182 lb (82.6 kg)   General: Alert, oriented, no distress.  Skin: normal turgor, no rashes, warm and dry HEENT: Normocephalic, atraumatic. Pupils equal round and reactive to light; sclera anicteric; extraocular muscles intact;  Nose without nasal septal hypertrophy Mouth/Parynx benign; Mallinpatti scale previously assessed as 2 Neck: No JVD, no carotid bruits; normal carotid upstroke Lungs: clear to ausculatation and percussion; no wheezing or rales Chest wall: without tenderness to palpitation Heart: PMI not displaced, RRR, s1 s2 normal, 1/6 systolic murmur, no diastolic murmur, no rubs, gallops, thrills, or heaves Abdomen: soft, nontender; no hepatosplenomehaly, BS+; abdominal aorta nontender and not dilated by palpation. Back: no CVA tenderness Pulses 2+ Musculoskeletal: full range of motion, normal strength, no joint deformities Extremities: no clubbing cyanosis or edema, Homan's sign negative  Neurologic: grossly nonfocal; Cranial nerves grossly wnl Psychologic: Normal mood and affect   ECG (independently read by me): NSR at 88; normal intervals. Nonspecific T changes.  May 2018 ECG (independently read by me): Sinus rhythm at 68 bpm.  Nonspecific T changes.  Normal intervals  September 2017 ECG (independently read by me): Normal sinus rhythm at 74 bpm with mild sinus arrhythmia.  Mild inferolateral T wave abnormalities.  September 2015 ECG (independently read by me): Normal sinus rhythm at 61 beats per minute.  QTc interval 4-6 Denise.  Nonspecific ST-T changes inferolaterally  December 2014 ECG: Normal sinus rhythm at 62 beats per minute. Nonspecific inferolateral T wave changes.  LABS: BMP Latest Ref Rng & Units 03/28/2018 11/28/2016 12/08/2015  Glucose 65 - 99 mg/dL 86 78 144(H)    BUN 8 - 27 mg/dL 27 17 24(H)  Creatinine 0.57 - 1.00 mg/dL 1.97(H) 1.13(H) 1.19(H)  BUN/Creat Ratio 12 - 28 14 - -  Sodium 134 - 144 mmol/L 140 138 137  Potassium 3.5 - 5.2 mmol/L 3.5 4.3 3.2(L)  Chloride 96 - 106 mmol/L 99 108 104  CO2 20 - 29 mmol/L 23 20(L) 26  Calcium 8.7 - 10.3 mg/dL 8.8 8.8(L) 8.3(L)   Hepatic Function Latest Ref Rng & Units 03/28/2018 11/28/2016 09/23/2015  Total Protein 6.0 - 8.5 g/dL 8.0 7.9 8.4(H)  Albumin 3.6 - 4.8 g/dL 4.2 3.5 3.8  AST 0 - 40 IU/L 24 19 17   ALT 0 - 32 IU/L 15 15 16   Alk Phosphatase 39 - 117 IU/L 101 75 80  Total Bilirubin 0.0 - 1.2 mg/dL 0.2 0.1(L) 0.4  Bilirubin, Direct 0.00 - 0.40 mg/dL 0.09 - -   CBC Latest Ref Rng & Units 03/28/2018 11/28/2016 12/08/2015  WBC 3.4 - 10.8 x10E3/uL 7.3 8.6 9.0  Hemoglobin 11.1 - 15.9 g/dL 10.9(L) 9.3(L) 10.9(L)  Hematocrit 34.0 - 46.6 % 35.4 29.8(L) 34.0(L)  Platelets 150 - 450 x10E3/uL 312 276 243   Lab Results  Component Value Date   MCV 77 (L) 03/28/2018   MCV 79.0 11/28/2016   MCV 78.3 12/08/2015   Lab Results  Component Value Date   TSH 1.830 03/28/2018     Lab Results  Component Value Date   HGBA1C 6.8 (H) 03/28/2018   Lipid Panel     Component Value  Date/Time   CHOL 144 03/28/2018 1031   TRIG 207 (H) 03/28/2018 1031   HDL 39 (L) 03/28/2018 1031   CHOLHDL 3.7 03/28/2018 1031   CHOLHDL 2.7 05/30/2014 0400   VLDL 31 05/30/2014 0400   LDLCALC 64 03/28/2018 1031    RADIOLOGY: No results found.  IMPRESSION:  1. Exertional shortness of breath   2. Coronary artery disease involving native coronary artery of native heart without angina pectoris   3. Essential hypertension   4. Hyperlipidemia with target LDL less than 70   5. Type 2 diabetes mellitus with complication, with long-term current use of insulin ([place])   6. Gastroesophageal reflux disease without esophagitis   7. Mild intermittent asthma without complication     ASSESSMENT AND PLAN: [name] is a 64 year old female  with established CAD and is 13 years status post percutaneous coronary intervention to her proximal RCA.  At that time, she had a 60 - 70% stenosis of her LAD as well as 20-30%. She has experienced recurrent episodes of probable musculoskeletal chest discomfort.  A nuclear stress test following in 2015 was interpreted as high risk; cardiac catheterization did not reveal significant change in her coronary anatomy and her previously placed RCA stent was widely patent.  Her LAD lesion was not physiologically significant by fractional flow reserve.  Presently, she denies any anginal symptomatology. However, she admits to some increasing shortness of breath over the past 6 to 7 months which typically is exertionally precipitated and occurs when climbing up steps.  Her resting pulse today is 88.  Her ECG remains stable.  Her blood pressure is normal on her regimen consisting of furosemide which she only takes as needed, isosorbide 120 mg daily, metoprolol succinate 25 mg daily, verapamil 240 mg and triamterene/HCTZ 37.5/25 mg daily.  I am recommending slight additional titration of her metoprolol succinate to 37.5 mg twice a day which would be helpful both for blood pressure control as well as improvement in her baseline resting heart rate in the upper 80s. If after 2 weeks her resting pulse is still elevated she will titrate this to 50 mg daily.  I am recommending that she undergo a follow-up echo Doppler study to reassess both systolic and diastolic function.  She has tolerated Toprol and denies any issues with wheezing.  Since I last saw her, she no longer is on atorvastatin and is on low-dose rosuvastatin at 5 mg daily.  Dr. Forde Dandy has been checking her laboratory and I will ask for recent studies for my review.  She is diabetic on Victoza in addition to her Humalog insulin and basagar KwikPen.  Her GERD is controlled with pantoprazole.  She has a peripheral neuropathy on Lyrica.  She continues to be on [place]  for her asthma.  She is on levothyroxine for hypothyroidism.  I will see her in 3 to 4 months for reevaluation.  Time spent: 30 minutes  Troy Sine, MD, [facility]  02/06/2019 6:13 PM

## 2019-02-06 ENCOUNTER — Encounter: Payer: Self-pay | Admitting: Cardiovascular Disease

## 2019-02-14 ENCOUNTER — Other Ambulatory Visit (HOSPITAL_COMMUNITY): Payer: 59

## 2019-03-25 ENCOUNTER — Other Ambulatory Visit: Payer: Self-pay | Admitting: Adult Health

## 2019-03-25 ENCOUNTER — Other Ambulatory Visit: Payer: Self-pay | Admitting: Cardiovascular Disease

## 2019-04-23 ENCOUNTER — Other Ambulatory Visit: Payer: Self-pay

## 2019-04-23 ENCOUNTER — Ambulatory Visit (HOSPITAL_COMMUNITY): Payer: 59 | Attending: Cardiology

## 2019-04-23 DIAGNOSIS — I10 Essential (primary) hypertension: Secondary | ICD-10-CM | POA: Diagnosis present

## 2019-04-24 ENCOUNTER — Other Ambulatory Visit: Payer: Self-pay | Admitting: Cardiovascular Disease

## 2019-05-06 ENCOUNTER — Ambulatory Visit (INDEPENDENT_AMBULATORY_CARE_PROVIDER_SITE_OTHER): Payer: 59 | Admitting: Cardiovascular Disease

## 2019-05-06 ENCOUNTER — Other Ambulatory Visit: Payer: Self-pay

## 2019-05-06 VITALS — BP 100/60 | HR 78 | Ht 62.0 in | Wt 184.0 lb

## 2019-05-06 DIAGNOSIS — I10 Essential (primary) hypertension: Secondary | ICD-10-CM

## 2019-05-06 DIAGNOSIS — Z794 Long term (current) use of insulin: Secondary | ICD-10-CM

## 2019-05-06 DIAGNOSIS — R0602 Shortness of breath: Secondary | ICD-10-CM | POA: Diagnosis not present

## 2019-05-06 DIAGNOSIS — E785 Hyperlipidemia, unspecified: Secondary | ICD-10-CM

## 2019-05-06 DIAGNOSIS — E118 Type 2 diabetes mellitus with unspecified complications: Secondary | ICD-10-CM

## 2019-05-06 DIAGNOSIS — Z79899 Other long term (current) drug therapy: Secondary | ICD-10-CM | POA: Diagnosis not present

## 2019-05-06 DIAGNOSIS — I2510 Atherosclerotic heart disease of native coronary artery without angina pectoris: Secondary | ICD-10-CM | POA: Diagnosis not present

## 2019-05-06 DIAGNOSIS — J4520 Mild intermittent asthma, uncomplicated: Secondary | ICD-10-CM

## 2019-05-06 DIAGNOSIS — K219 Gastro-esophageal reflux disease without esophagitis: Secondary | ICD-10-CM

## 2019-05-06 MED ORDER — METOPROLOL SUCCINATE ER 25 MG PO TB24: ORAL_TABLET | ORAL | 3 refills | Status: DC

## 2019-05-06 MED ORDER — ISOSORBIDE MONONITRATE ER 60 MG PO TB24: 60.0000 mg | ORAL_TABLET | Freq: Every day | ORAL | 3 refills | Status: DC

## 2019-05-06 MED ORDER — VERAPAMIL HCL ER 120 MG PO TBCR: 120.0000 mg | EXTENDED_RELEASE_TABLET | Freq: Every day | ORAL | 6 refills | Status: DC

## 2019-05-06 NOTE — Patient Instructions (Signed)
Medication Instructions:  Medication changes ---Decrease Verapamil  120 mg ( 1/2 tablet of 240 mg ) take at bedtime --- Decrease isosorbide mono 60 mg  ( 1/2 tablet of  120 mg ) ---   --- take 25 mg of metoprolol  Succinate  At bedtime Take 12.5 mg of Metoprolol succinate in the morning    If you need a refill on your cardiac medications before your next appointment, please call your pharmacy.   Lab work: Not needed  Testing/Procedures: Not needed  Follow-Up: At [facility], you and your health needs are our priority.  As part of our continuing mission to provide you with exceptional heart care, we have created designated Provider Care Teams.  These Care Teams include your primary Cardiologist (physician) and Advanced Practice Providers (APPs -  Physician Assistants and Nurse Practitioners) who all work together to provide you with the care you need, when you need it. . Your physician recommends that you schedule a follow-up appointment in: 2 months with Dr Claiborne Billings  .   Any Other Special Instructions Will Be Listed Below (If Applicable).

## 2019-05-06 NOTE — Progress Notes (Signed)
Patient ID: [name], female   DOB: 03-Sep-1955, 63 y.o.   MRN: [id number]      PCP: Dr. Forde Dandy  HPI: [name] is a 63 y.o. female who presents to the office for a 3 month cardiology evaluation.   [name] has known coronary artery disease and in 2007 underwent insertion of a 3.0x13 mm Cypher DES stent to her proximal RCA. She did have concomitant CAD with 60-70% stenosis of her LAD and 20% circumflex stenosis. In July 2010 she develop recurrent chest pain and repeat catheterization showed 60% LAD stenosis, 20-30% circumflex stenosis and a widely patent RCA stent.  She has a history of hypertension, hyperlipidemia, hypothyroidism, diabetes mellitus, asthma, GERD, as well as intermittent leg swelling.  When I saw her in December, 2014, she had experienced some episodes of chest pain, which sounded musculoskeletal.  However, with her cardiac risk factor profile, and known CAD .  I recommended a nuclear stress test, but she did not have that done.  I last saw her in September 2015 and ultimately recommended that she undergo the nuclear stress study.  This was interpreted as a high risk study revealing a large moderate intensity reversible distal anterior apical and inferior defect.  As result, she underwent cardiac catheterization in October 2015.  Her catheterization findings were essentially unchanged from previously and showed a widely patent RCA stent.  There was segmental 50% proximal to mid LAD stenoses with an FFR of 0.94.  There were irregularities in the circumflex.  She had normal LV function.  She sees Dr. Forde Dandy and had laboratory in January 2017.  This was notable in that her cholesterol was 191, triglycerides 267, HDL 49, and LDL 89.  Gross was elevated at 190.  Her TSH was 0.11 and free T4 1 0.5.  Subsequent blood work in May 2017 showed a BUN of 24, creatinine 1.19.  Hemoglobin 10.9, hematocrit 34.0.  On 01/06/2016 she underwent an echo Doppler study which showed an  ejection fraction of 60-65% with grade 1 diastolic dysfunction.  There was trivial AR.  When I last saw her, she denied  any episodes of exertional chest tightness, but had rare episodes of chest wall discomfort which improves with aspirin.  She had occasional swelling of her ankles.  She also has a history of intermittent wheezing for which he takes Advair.  Laboratory by Dr. Forde Dandy had revealed significant triglyceride elevation and I recommended lovasa at 2 capsules twice a day.    When  saw her in May 2018 she had experienced occasional episodes of wheezing in the winter months.  She has sleep apnea and is on CPAP.  She denied any episodes of chest pressure.  She was unaware of palpitations.  She went to the emergency room on 11/28/2016 with shortness of breath.  She was given a breathing treatment in the emergency room with moderate relief of her symptoms.  She was felt to have mild asthma with exacerbation.  Laboratory  showed stable renal function with a creatinine 1.13.  She was anemic and is being followed by Dr. Forde Dandy with her hemoglobin of 9.3, hematocrit of 29.8.  BNP was normal at 59.9.   After not seeing her for 25 months, I last saw her on February 04, 2019.  She is without recurrent anginal symptomatology, however she had noticed increasing episodes of shortness of breath over the previous 6 to 7 months.  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  Seen  Dr. Forde Dandy and her hemoglobin A1c was 6.6.  During that evaluation I recommended slight additional titration of her metoprolol succinate to 37.5 mg  which would be helpful both for more optimal blood pressure control as well as improvement in her baseline resting heart rate which was in the upper 80s.  She underwent a follow-up echo Doppler study on April 23, 2019 which showed normal systolic and diastolic parameters.  Ejection fraction was 60 to 65%.  There was mild left ventricular hypertrophy and mild aortic insufficiency.   Since I last saw her, she is fatigued and at times mild lightheadedness and this continues to experience some exertional shortness of breath.  She is unaware of any wheezing.  She denies fever chills night sweats.  She denies palpitations.  She presents for reevaluation.  Past Medical History:  Diagnosis Date  . Acute bronchitis   . Anemia    hx of anemia  . Anginal pain ([place])   . Arthritis    osteo  . Coronary atherosclerosis   . Cough   . Hypertension   . Obstructive sleep apnea (adult) (pediatric)   . Type II or unspecified type diabetes mellitus without mention of complication, not stated as uncontrolled    type 2  . Unspecified hypothyroidism     Past Surgical History:  Procedure Laterality Date  . ANTERIOR FUSION CERVICAL SPINE    . LEFT HEART CATHETERIZATION WITH CORONARY ANGIOGRAM N/A 05/29/2014   Procedure: LEFT HEART CATHETERIZATION WITH CORONARY ANGIOGRAM;  Surgeon: Sinclair Grooms, MD;  Location: [facility] CATH LAB;  Service: Cardiovascular;  Laterality: N/A;  . TUBAL LIGATION    . WISDOM TOOTH EXTRACTION      Allergies  Allergen Reactions  . Codeine Other (See Comments)    REACTION: hallucinations/"loopy"  . Levaquin [Levofloxacin] Nausea And Vomiting  . Sulfa Antibiotics Hives  . Sulfonamide Derivatives Hives    Current Outpatient Medications  Medication Sig Dispense Refill  . albuterol (PROVENTIL HFA) 108 (90 BASE) MCG/ACT inhaler Inhale 2 puffs into the lungs every 6 (six) hours as needed for wheezing or shortness of breath. For shortness of breath and wheezing 1 Inhaler prn  . BREO ELLIPTA 200-25 MCG/INH AEPB Inhale 1 puff into the lungs 2 (two) times daily.    . Calcium Carbonate Antacid (ALKA-SELTZER ANTACID PO) Take by mouth daily as needed.    . clopidogrel (PLAVIX) 75 MG tablet TAKE ONE TABLET BY MOUTH DAILY 30 tablet 2  . diazepam (VALIUM) 5 MG tablet Take 0.5 tablets by mouth as needed. .5 to 1 tablet prn for muscle cramps    . diclofenac (VOLTAREN) 75  MG EC tablet Take 75 mg by mouth daily as needed. For muscle aches    . furosemide (LASIX) 40 MG tablet Take 1 tablet (40 mg total) by mouth daily as needed for fluid. 90 tablet 3  . insulin glargine (LANTUS) 100 UNIT/ML injection Inject 35 Units into the skin daily.    . insulin lispro (HUMALOG) 100 UNIT/ML injection Inject 5 Units into the skin daily as needed for high blood sugar (sliding scale).     Marland Kitchen levothyroxine (SYNTHROID, LEVOTHROID) 200 MCG tablet Take 200 mcg by mouth daily.    . metoprolol succinate (TOPROL-XL) 25 MG 24 hr tablet Take 12.5 mg ( 1/2 tablet ) in the morning and take 25 mg  ( 1 tablet) at bedtime 180 tablet 3  . NOVOTWIST 32G X 5 MM MISC Use as directed    . omega-3 acid ethyl  esters (LOVAZA) 1 g capsule TAKE TWO CAPSULES BY MOUTH TWO TIMES A DAY 120 capsule 0  . ONE TOUCH ULTRA TEST test strip Use as directed    . pantoprazole (PROTONIX) 40 MG tablet Take 40 mg by mouth daily.    . pregabalin (LYRICA) 75 MG capsule Take 75 mg by mouth 2 (two) times daily.    . rosuvastatin (CRESTOR) 5 MG tablet TAKE ONE TABLET BY MOUTH DAILY 30 tablet 2  . triamterene-hydrochlorothiazide (MAXZIDE-25) 37.5-25 MG tablet TAKE ONE TABLET BY MOUTH DAILY 30 tablet 2  . VICTOZA 18 MG/3ML SOPN Inject 1.8 mg into the skin daily.     . isosorbide mononitrate (IMDUR) 60 MG 24 hr tablet Take 1 tablet (60 mg total) by mouth daily. 90 tablet 3  . verapamil (CALAN-SR) 120 MG CR tablet Take 1 tablet (120 mg total) by mouth at bedtime. 30 tablet 6   No current facility-administered medications for this visit.     Social History   Socioeconomic History  . Marital status: Widowed    Spouse name: Not on file  . Number of children: Not on file  . Years of education: Not on file  . Highest education level: Not on file  Occupational History  . Occupation: retired  Scientific laboratory technician  . Financial resource strain: Not on file  . Food insecurity    Worry: Not on file    Inability: Not on file  .  Transportation needs    Medical: Not on file    Non-medical: Not on file  Tobacco Use  . Smoking status: Former Smoker    Packs/day: 0.10    Years: 30.00    Pack years: 3.00    Quit date: 08/09/2003    Years since quitting: 15.7  . Smokeless tobacco: Never Used  Substance and Sexual Activity  . Alcohol use: No    Alcohol/week: 0.0 standard drinks  . Drug use: No  . Sexual activity: Not on file  Lifestyle  . Physical activity    Days per week: Not on file    Minutes per session: Not on file  . Stress: Not on file  Relationships  . Social Herbalist on phone: Not on file    Gets together: Not on file    Attends religious service: Not on file    Active member of club or organization: Not on file    Attends meetings of clubs or organizations: Not on file    Relationship status: Not on file  . Intimate partner violence    Fear of current or ex partner: Not on file    Emotionally abused: Not on file    Physically abused: Not on file    Forced sexual activity: Not on file  Other Topics Concern  . Not on file  Social History Narrative  . Not on file   Social she is widowed. She does try to be active. There is no tobacco use. She does drink occasional alcohol.  Family History  Problem Relation Age of Onset  . Hypertension Mother   . Heart disease Mother   . Stroke Mother   . Prostate cancer Father   . Heart disease Brother   . Heart attack Sister    ROS General: Negative; No fevers, chills, or night sweats;  HEENT: Negative; No changes in vision or hearing, sinus congestion, difficulty swallowing Pulmonary: Negative; No cough, wheezing, shortness of breath, hemoptysis Cardiovascular: Negative; No chest pain, presyncope, syncope, palpitations Positive for  rare, intermittent leg swelling GI: Positive for GERD; No nausea, vomiting, diarrhea, or abdominal pain GU: Negative; No dysuria, hematuria, or difficulty voiding Musculoskeletal: Positive for arthritis in her  knees and hips; no myalgias, joint pain, or weakness Hematologic/Oncology: Negative; no easy bruising, bleeding Endocrine: Positive for hypothyroidism, and type 2 diabetes mellitus; Neuro: Negative; no changes in balance, headaches Skin: Negative; No rashes or skin lesions Psychiatric: Negative; No behavioral problems, depression Sleep: Negative; No snoring, daytime sleepiness, hypersomnolence, bruxism, restless legs, hypnogognic hallucinations, no cataplexy Other comprehensive 14 point system review is negative.   PE BP 100/60   Pulse 78   Ht 5' 2"  (1.575 m)   Wt 184 lb (83.5 kg)   SpO2 93%   BMI 33.65 kg/m    Repeat blood pressure by me was 102/60 supine and 94/60 standing  Wt Readings from Last 3 Encounters:  05/06/19 184 lb (83.5 kg)  02/04/19 187 lb (84.8 kg)  03/15/18 184 lb (83.5 kg)   General: Alert, oriented, no distress.  Skin: normal turgor, no rashes, warm and dry HEENT: Normocephalic, atraumatic. Pupils equal round and reactive to light; sclera anicteric; extraocular muscles intact;  Nose without nasal septal hypertrophy Mouth/Parynx benign; Mallinpatti scale 2 Neck: No JVD, no carotid bruits; normal carotid upstroke Lungs: clear to ausculatation and percussion; no wheezing or rales Chest wall: without tenderness to palpitation Heart: PMI not displaced, RRR, s1 s2 normal, 1/6 systolic murmur, no diastolic murmur, no rubs, gallops, thrills, or heaves Abdomen: soft, nontender; no hepatosplenomehaly, BS+; abdominal aorta nontender and not dilated by palpation. Back: no CVA tenderness Pulses 2+ Musculoskeletal: full range of motion, normal strength, no joint deformities Extremities: no clubbing cyanosis or edema, Homan's sign negative  Neurologic: grossly nonfocal; Cranial nerves grossly wnl Psychologic: Normal mood and affect   ECG (independently read by me): Normal sinus rhythm at 78 bpm.  No ectopy.  Normal intervals.     June 2020 ECG (independently read by  me): NSR at 88; normal intervals. Nonspecific T changes.  May 2018 ECG (independently read by me): Sinus rhythm at 68 bpm.  Nonspecific T changes.  Normal intervals  September 2017 ECG (independently read by me): Normal sinus rhythm at 74 bpm with mild sinus arrhythmia.  Mild inferolateral T wave abnormalities.  September 2015 ECG (independently read by me): Normal sinus rhythm at 61 beats per minute.  QTc interval 4-6 Denise.  Nonspecific ST-T changes inferolaterally  December 2014 ECG: Normal sinus rhythm at 62 beats per minute. Nonspecific inferolateral T wave changes.  LABS: BMP Latest Ref Rng & Units 03/28/2018 11/28/2016 12/08/2015  Glucose 65 - 99 mg/dL 86 78 144(H)  BUN 8 - 27 mg/dL 27 17 24(H)  Creatinine 0.57 - 1.00 mg/dL 1.97(H) 1.13(H) 1.19(H)  BUN/Creat Ratio 12 - 28 14 - -  Sodium 134 - 144 mmol/L 140 138 137  Potassium 3.5 - 5.2 mmol/L 3.5 4.3 3.2(L)  Chloride 96 - 106 mmol/L 99 108 104  CO2 20 - 29 mmol/L 23 20(L) 26  Calcium 8.7 - 10.3 mg/dL 8.8 8.8(L) 8.3(L)   Hepatic Function Latest Ref Rng & Units 03/28/2018 11/28/2016 09/23/2015  Total Protein 6.0 - 8.5 g/dL 8.0 7.9 8.4(H)  Albumin 3.6 - 4.8 g/dL 4.2 3.5 3.8  AST 0 - 40 IU/L 24 19 17   ALT 0 - 32 IU/L 15 15 16   Alk Phosphatase 39 - 117 IU/L 101 75 80  Total Bilirubin 0.0 - 1.2 mg/dL 0.2 0.1(L) 0.4  Bilirubin, Direct 0.00 - 0.40 mg/dL  0.09 - -   CBC Latest Ref Rng & Units 03/28/2018 11/28/2016 12/08/2015  WBC 3.4 - 10.8 x10E3/uL 7.3 8.6 9.0  Hemoglobin 11.1 - 15.9 g/dL 10.9(L) 9.3(L) 10.9(L)  Hematocrit 34.0 - 46.6 % 35.4 29.8(L) 34.0(L)  Platelets 150 - 450 x10E3/uL 312 276 243   Lab Results  Component Value Date   MCV 77 (L) 03/28/2018   MCV 79.0 11/28/2016   MCV 78.3 12/08/2015   Lab Results  Component Value Date   TSH 1.830 03/28/2018     Lab Results  Component Value Date   HGBA1C 6.8 (H) 03/28/2018   Lipid Panel     Component Value Date/Time   CHOL 144 03/28/2018 1031   TRIG 207 (H) 03/28/2018 1031    HDL 39 (L) 03/28/2018 1031   CHOLHDL 3.7 03/28/2018 1031   CHOLHDL 2.7 05/30/2014 0400   VLDL 31 05/30/2014 0400   LDLCALC 64 03/28/2018 1031    RADIOLOGY: No results found.  IMPRESSION:  1. Exertional shortness of breath   2. Coronary artery disease involving native coronary artery of native heart without angina pectoris   3. Medication management   4. Essential hypertension   5. Hyperlipidemia with target LDL less than 70   6. Type 2 diabetes mellitus with complication, with long-term current use of insulin ([place])   7. Gastroesophageal reflux disease without esophagitis   8. Mild intermittent asthma without complication     ASSESSMENT AND PLAN: Denise. Boardley is a 63 year old female with established CAD and is 13 years status post percutaneous coronary intervention to her proximal RCA in 2007.  At that time, she had a 60 - 70% stenosis of her LAD as well as 20-30%. She has experienced recurrent episodes of probable musculoskeletal chest discomfort.  A nuclear stress test following in 2015 was interpreted as high risk; cardiac catheterization did not reveal significant change in her coronary anatomy and her previously placed RCA stent was widely patent.  Her LAD lesion was not physiologically significant by fractional flow reserve.  She has noticed exertional dyspnea.  I reviewed her echo Doppler study from April 23, 2019 which revealed normal systolic as well as diastolic function.  There was mild left ventricular hypertrophy and mild aortic regurgitation.  When I last saw her, I slightly titrated her beta-blocker therapy and her resting pulse today is improved in the 70s compared to the upper 80s.  However, her blood pressure remains low on her current regimen.  As result I have recommended she decrease her isosorbide from 120 mg down to 60 mg.  I am also reducing her verapamil SR from 240 mg down to 120 mg.  She will continue with her current dose of Toprol-XL 37.5 mg but she will stagger  this and take 12.5 mg in the morning and 25 mg at night.  If heart rate increases in blood pressure increases further titration of beta-blockade can be done in the future.  She continues to be on levothyroxine 200 mcg for hypothyroidism followed by Dr. Forde Dandy.  She continues to be on [place] for her mild intermittent asthma without complication.  She is diabetic on insulin and Victoza.  Her GERD trolled with pantoprazole.  She is on Lyrica for peripheral neuropathy.  I will see her in 2 months for reevaluation.  Time spent: 25 minutes  Troy Sine, MD, [facility]  05/07/2019 8:16 AM

## 2019-05-07 ENCOUNTER — Encounter: Payer: Self-pay | Admitting: Cardiovascular Disease

## 2019-06-23 ENCOUNTER — Other Ambulatory Visit: Payer: Self-pay | Admitting: Cardiovascular Disease

## 2019-06-26 NOTE — Telephone Encounter (Signed)
Rx(s) sent to pharmacy electronically.  

## 2019-07-10 ENCOUNTER — Encounter: Payer: Self-pay | Admitting: Cardiovascular Disease

## 2019-07-10 ENCOUNTER — Ambulatory Visit (INDEPENDENT_AMBULATORY_CARE_PROVIDER_SITE_OTHER): Payer: 59 | Admitting: Cardiovascular Disease

## 2019-07-10 ENCOUNTER — Other Ambulatory Visit: Payer: Self-pay

## 2019-07-10 VITALS — BP 129/76 | HR 63 | Ht 62.0 in | Wt 188.0 lb

## 2019-07-10 DIAGNOSIS — E785 Hyperlipidemia, unspecified: Secondary | ICD-10-CM

## 2019-07-10 DIAGNOSIS — I2510 Atherosclerotic heart disease of native coronary artery without angina pectoris: Secondary | ICD-10-CM | POA: Diagnosis not present

## 2019-07-10 DIAGNOSIS — E118 Type 2 diabetes mellitus with unspecified complications: Secondary | ICD-10-CM

## 2019-07-10 DIAGNOSIS — I10 Essential (primary) hypertension: Secondary | ICD-10-CM | POA: Diagnosis not present

## 2019-07-10 DIAGNOSIS — K219 Gastro-esophageal reflux disease without esophagitis: Secondary | ICD-10-CM

## 2019-07-10 DIAGNOSIS — Z794 Long term (current) use of insulin: Secondary | ICD-10-CM

## 2019-07-10 DIAGNOSIS — R0602 Shortness of breath: Secondary | ICD-10-CM

## 2019-07-10 DIAGNOSIS — E1142 Type 2 diabetes mellitus with diabetic polyneuropathy: Secondary | ICD-10-CM

## 2019-07-10 NOTE — Progress Notes (Signed)
Patient ID: [name], female   DOB: [birth year], 63 y.o.   MRN: [id number]      PCP: Dr. Forde Dandy  HPI: [name] is a 63 y.o. female who presents to the office for a 3 month cardiology evaluation.   [name] has known coronary artery disease and in 2007 underwent insertion of a 3.0x13 mm Cypher DES stent to her proximal RCA. She did have concomitant CAD with 60-70% stenosis of her LAD and 20% circumflex stenosis. In July 2010 she develop recurrent chest pain and repeat catheterization showed 60% LAD stenosis, 20-30% circumflex stenosis and a widely patent RCA stent.  She has a history of hypertension, hyperlipidemia, hypothyroidism, diabetes mellitus, asthma, GERD, as well as intermittent leg swelling.  When I saw her in December, 2014, she had experienced some episodes of chest pain, which sounded musculoskeletal.  However, with her cardiac risk factor profile, and known CAD .  I recommended a nuclear stress test, but she did not have that done.  I last saw her in September 2015 and ultimately recommended that she undergo the nuclear stress study.  This was interpreted as a high risk study revealing a large moderate intensity reversible distal anterior apical and inferior defect.  As result, she underwent cardiac catheterization in October 2015.  Her catheterization findings were essentially unchanged from previously and showed a widely patent RCA stent.  There was segmental 50% proximal to mid LAD stenoses with an FFR of 0.94.  There were irregularities in the circumflex.  She had normal LV function.  She sees Dr. Forde Dandy and had laboratory in January 2017.  This was notable in that her cholesterol was 191, triglycerides 267, HDL 49, and LDL 89.  Gross was elevated at 190.  Her TSH was 0.11 and free T4 1 0.5.  Subsequent blood work in May 2017 showed a BUN of 24, creatinine 1.19.  Hemoglobin 10.9, hematocrit 34.0.  On 01/06/2016 she underwent an echo Doppler study which showed an  ejection fraction of 60-65% with grade 1 diastolic dysfunction.  There was trivial AR.  When I last saw her, she denied  any episodes of exertional chest tightness, but had rare episodes of chest wall discomfort which improves with aspirin.  She had occasional swelling of her ankles.  She also has a history of intermittent wheezing for which he takes Advair.  Laboratory by Dr. Forde Dandy had revealed significant triglyceride elevation and I recommended lovasa at 2 capsules twice a day.    When  saw her in May 2018 she had experienced occasional episodes of wheezing in the winter months.  She has sleep apnea and is on CPAP.  She denied any episodes of chest pressure.  She was unaware of palpitations.  She went to the emergency room on 11/28/2016 with shortness of breath.  She was given a breathing treatment in the emergency room with moderate relief of her symptoms.  She was felt to have mild asthma with exacerbation.  Laboratory  showed stable renal function with a creatinine 1.13.  She was anemic and is being followed by Dr. Forde Dandy with her hemoglobin of 9.3, hematocrit of 29.8.  BNP was normal at 59.9.   After not seeing her for 25 months, I last saw her on February 04, 2019.  She is without recurrent anginal symptomatology, however she had noticed increasing episodes of shortness of breath over the previous 6 to 7 months.  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  Seen  Dr. Forde Dandy and her hemoglobin A1c was 6.6.  During that evaluation I recommended slight additional titration of her metoprolol succinate to 37.5 mg  which would be helpful both for more optimal blood pressure control as well as improvement in her baseline resting heart rate which was in the upper 80s.  She underwent a follow-up echo Doppler study on April 23, 2019 which showed normal systolic and diastolic parameters.  Ejection fraction was 60 to 65%.  There was mild left ventricular hypertrophy and mild aortic insufficiency.  I  last saw her on May 06, 2019.  At that time, she complained of being fatigued and had noticed some mild lightheadedness and some exertional shortness of breath.  She was unaware of any wheezing and denied fever chills or night sweats.  She denied palpitations.  At that time, her blood pressure was low and I recommended she decrease isosorbide from 120 mg down to 60 mg and reduce Verapamil SR from 240 mg down to 120 mg.  I recommended she continue Toprol-XL 37.5 mg but to stagger this and take 12.5 mg in the morning and 25 mg at night.  She has been on levothyroxine 200 mcg for hypothyroidism followed by Dr. Forde Dandy and also was on [facility] for intermittent asthma.  She was on insulin and Victoza for her diabetes.  Presently, her blood pressure has improved as has her lightheadedness.  An echo Doppler study in September 2020 showed an EF at 60 to 65% with mild LVH.  There was mild aortic insufficiency.  She has continued to use CPAP therapy.  She has had advance home care and now advanced for her DME company.  Remotely she had been with choice and would like to be switched back to them.  She presents for evaluation.  Past Medical History:  Diagnosis Date   Acute bronchitis    Anemia    hx of anemia   Anginal pain (HCC)    Arthritis    osteo   Coronary atherosclerosis    Cough    Hypertension    Obstructive sleep apnea (adult) (pediatric)    Type II or unspecified type diabetes mellitus without mention of complication, not stated as uncontrolled    type 2   Unspecified hypothyroidism     Past Surgical History:  Procedure Laterality Date   ANTERIOR FUSION CERVICAL SPINE     LEFT HEART CATHETERIZATION WITH CORONARY ANGIOGRAM N/A 05/29/2014   Procedure: LEFT HEART CATHETERIZATION WITH CORONARY ANGIOGRAM;  Surgeon: Sinclair Grooms, MD;  Location: [facility] CATH LAB;  Service: Cardiovascular;  Laterality: N/A;   TUBAL LIGATION     WISDOM TOOTH EXTRACTION      Allergies    Allergen Reactions   Codeine Other (See Comments)    REACTION: hallucinations/"loopy"   Levaquin [Levofloxacin] Nausea And Vomiting   Sulfa Antibiotics Hives   Sulfonamide Derivatives Hives    Current Outpatient Medications  Medication Sig Dispense Refill   albuterol (PROVENTIL HFA) 108 (90 BASE) MCG/ACT inhaler Inhale 2 puffs into the lungs every 6 (six) hours as needed for wheezing or shortness of breath. For shortness of breath and wheezing 1 Inhaler prn   BREO ELLIPTA 200-25 MCG/INH AEPB Inhale 1 puff into the lungs 2 (two) times daily.     Calcium Carbonate Antacid (ALKA-SELTZER ANTACID PO) Take by mouth daily as needed.     clopidogrel (PLAVIX) 75 MG tablet TAKE ONE TABLET BY MOUTH DAILY 30 tablet 2   diazepam (VALIUM) 5 MG tablet Take  0.5 tablets by mouth as needed. .5 to 1 tablet prn for muscle cramps     diclofenac (VOLTAREN) 75 MG EC tablet Take 75 mg by mouth daily as needed. For muscle aches     furosemide (LASIX) 40 MG tablet Take 1 tablet (40 mg total) by mouth daily as needed for fluid. 90 tablet 3   insulin glargine (LANTUS) 100 UNIT/ML injection Inject 35 Units into the skin daily.     insulin lispro (HUMALOG) 100 UNIT/ML injection Inject 5 Units into the skin daily as needed for high blood sugar (sliding scale).      isosorbide mononitrate (IMDUR) 60 MG 24 hr tablet Take 1 tablet (60 mg total) by mouth daily. 90 tablet 3   levothyroxine (SYNTHROID, LEVOTHROID) 200 MCG tablet Take 200 mcg by mouth daily.     metoprolol succinate (TOPROL-XL) 25 MG 24 hr tablet Take 12.5 mg ( 1/2 tablet ) in the morning and take 25 mg  ( 1 tablet) at bedtime 180 tablet 3   NOVOTWIST 32G X 5 MM MISC Use as directed     omega-3 acid ethyl esters (LOVAZA) 1 g capsule TAKE TWO CAPSULES BY MOUTH TWO TIMES A DAY 120 capsule 0   ONE TOUCH ULTRA TEST test strip Use as directed     pantoprazole (PROTONIX) 40 MG tablet Take 40 mg by mouth daily.     pregabalin (LYRICA) 75 MG  capsule Take 75 mg by mouth 2 (two) times daily.     rosuvastatin (CRESTOR) 5 MG tablet Take 1 tablet (5 mg total) by mouth daily. 90 tablet 3   triamterene-hydrochlorothiazide (MAXZIDE-25) 37.5-25 MG tablet Take 1 tablet by mouth daily. 90 tablet 3   verapamil (CALAN-SR) 120 MG CR tablet Take 1 tablet (120 mg total) by mouth at bedtime. 90 tablet 3   VICTOZA 18 MG/3ML SOPN Inject 1.8 mg into the skin daily.      No current facility-administered medications for this visit.     Social History   Socioeconomic History   Marital status: Widowed    Spouse name: Not on file   Number of children: Not on file   Years of education: Not on file   Highest education level: Not on file  Occupational History   Occupation: retired  Scientist, product/process development strain: Not on file   Food insecurity    Worry: Not on file    Inability: Not on file   Transportation needs    Medical: Not on file    Non-medical: Not on file  Tobacco Use   Smoking status: Former Smoker    Packs/day: 0.10    Years: 30.00    Pack years: 3.00    Quit date: 08/09/2003    Years since quitting: 15.9   Smokeless tobacco: Never Used  Substance and Sexual Activity   Alcohol use: No    Alcohol/week: 0.0 standard drinks   Drug use: No   Sexual activity: Not on file  Lifestyle   Physical activity    Days per week: Not on file    Minutes per session: Not on file   Stress: Not on file  Relationships   Social connections    Talks on phone: Not on file    Gets together: Not on file    Attends religious service: Not on file    Active member of club or organization: Not on file    Attends meetings of clubs or organizations: Not on file  Relationship status: Not on file   Intimate partner violence    Fear of current or ex partner: Not on file    Emotionally abused: Not on file    Physically abused: Not on file    Forced sexual activity: Not on file  Other Topics Concern   Not on file    Social History Narrative   Not on file   Social she is widowed. She does try to be active. There is no tobacco use. She does drink occasional alcohol.  Family History  Problem Relation Age of Onset   Hypertension Mother    Heart disease Mother    Stroke Mother    Prostate cancer Father    Heart disease Brother    Heart attack Sister    ROS General: Negative; No fevers, chills, or night sweats;  HEENT: Negative; No changes in vision or hearing, sinus congestion, difficulty swallowing Pulmonary: Negative; No cough, wheezing, shortness of breath, hemoptysis Cardiovascular: Negative; No chest pain, presyncope, syncope, palpitations Positive for rare, intermittent leg swelling GI: Positive for GERD; No nausea, vomiting, diarrhea, or abdominal pain GU: Negative; No dysuria, hematuria, or difficulty voiding Musculoskeletal: Positive for arthritis in her knees and hips; no myalgias, joint pain, or weakness Hematologic/Oncology: Negative; no easy bruising, bleeding Endocrine: Positive for hypothyroidism, and type 2 diabetes mellitus; Neuro: Negative; no changes in balance, headaches Skin: Negative; No rashes or skin lesions Psychiatric: Negative; No behavioral problems, depression Sleep: Positive OSA on CPAP;  No breakthrough snoring, daytime sleepiness, hypersomnolence, bruxism, restless legs, hypnogognic hallucinations, no cataplexy Other comprehensive 14 point system review is negative.   PE BP 129/76    Pulse 63    Ht 5' 2" (1.575 m)    Wt 188 lb (85.3 kg)    SpO2 98%    BMI 34.39 kg/m    Repeat blood pressure by me was 130/72  Wt Readings from Last 3 Encounters:  07/10/19 188 lb (85.3 kg)  05/06/19 184 lb (83.5 kg)  02/04/19 187 lb (84.8 kg)   General: Alert, oriented, no distress.  Skin: normal turgor, no rashes, warm and dry HEENT: Normocephalic, atraumatic. Pupils equal round and reactive to light; sclera anicteric; extraocular muscles intact;  Nose without nasal  septal hypertrophy Mouth/Parynx benign; Mallinpatti scale 3 Neck: No JVD, no carotid bruits; normal carotid upstroke Lungs: clear to ausculatation and percussion; no wheezing or rales Chest wall: Mild costochondral tenderness Heart: PMI not displaced, RRR, s1 s2 normal, 1/6 systolic murmur, no diastolic murmur, no rubs, gallops, thrills, or heaves Abdomen: soft, nontender; no hepatosplenomehaly, BS+; abdominal aorta nontender and not dilated by palpation. Back: no CVA tenderness Pulses 2+ Musculoskeletal: full range of motion, normal strength, no joint deformities Extremities: no clubbing cyanosis or edema, Homan's sign negative  Neurologic: grossly nonfocal; Cranial nerves grossly wnl Psychologic: Normal mood and affect   ECG (independently read by me): NSRat 63; nonspecific T wave abnormality   September 2019 ECG (independently read by me): Normal sinus rhythm at 78 bpm.  No ectopy.  Normal intervals.     June 2020 ECG (independently read by me): NSR at 88; normal intervals. Nonspecific T changes.  May 2018 ECG (independently read by me): Sinus rhythm at 68 bpm.  Nonspecific T changes.  Normal intervals  September 2017 ECG (independently read by me): Normal sinus rhythm at 74 bpm with mild sinus arrhythmia.  Mild inferolateral T wave abnormalities.  September 2015 ECG (independently read by me): Normal sinus rhythm at 61 beats per minute.  QTc interval 4-6 Denise.  Nonspecific ST-T changes inferolaterally  December 2014 ECG: Normal sinus rhythm at 62 beats per minute. Nonspecific inferolateral T wave changes.  LABS: BMP Latest Ref Rng & Units 03/28/2018 11/28/2016 12/08/2015  Glucose 65 - 99 mg/dL 86 78 144(H)  BUN 8 - 27 mg/dL 27 17 24(H)  Creatinine 0.57 - 1.00 mg/dL 1.97(H) 1.13(H) 1.19(H)  BUN/Creat Ratio 12 - 28 14 - -  Sodium 134 - 144 mmol/L 140 138 137  Potassium 3.5 - 5.2 mmol/L 3.5 4.3 3.2(L)  Chloride 96 - 106 mmol/L 99 108 104  CO2 20 - 29 mmol/L 23 20(L) 26  Calcium 8.7  - 10.3 mg/dL 8.8 8.8(L) 8.3(L)   Hepatic Function Latest Ref Rng & Units 03/28/2018 11/28/2016 09/23/2015  Total Protein 6.0 - 8.5 g/dL 8.0 7.9 8.4(H)  Albumin 3.6 - 4.8 g/dL 4.2 3.5 3.8  AST 0 - 40 IU/L _0 ALT 0 - 32 IU/L _1 Alk Phosphatase 39 - 117 IU/L 101 75 80  Total Bilirubin 0.0 - 1.2 mg/dL 0.2 0.1(L) 0.4  Bilirubin, Direct 0.00 - 0.40 mg/dL 0.09 - -   CBC Latest Ref Rng & Units 03/28/2018 11/28/2016 12/08/2015  WBC 3.4 - 10.8 x10E3/uL 7.3 8.6 9.0  Hemoglobin 11.1 - 15.9 g/dL 10.9(L) 9.3(L) 10.9(L)  Hematocrit 34.0 - 46.6 % 35.4 29.8(L) 34.0(L)  Platelets 150 - 450 x10E3/uL 312 276 243   Lab Results  Component Value Date   MCV 77 (L) 03/28/2018   MCV 79.0 11/28/2016   MCV 78.3 12/08/2015   Lab Results  Component Value Date   TSH 1.830 03/28/2018     Lab Results  Component Value Date   HGBA1C 6.8 (H) 03/28/2018   Lipid Panel     Component Value Date/Time   CHOL 144 03/28/2018 1031   TRIG 207 (H) 03/28/2018 1031   HDL 39 (L) 03/28/2018 1031   CHOLHDL 3.7 03/28/2018 1031   CHOLHDL 2.7 05/30/2014 0400   VLDL 31 05/30/2014 0400   LDLCALC 64 03/28/2018 1031    RADIOLOGY: No results found.  IMPRESSION:  1. Coronary artery disease involving native coronary artery of native heart without angina pectoris   2. Essential hypertension   3. Exertional shortness of breath   4. Hyperlipidemia with target LDL less than 70   5. Type 2 diabetes mellitus with complication, with long-term current use of insulin ([place])   6. Type 2 diabetes mellitus with peripheral neuropathy (HCC)   7. Gastroesophageal reflux disease without esophagitis     ASSESSMENT AND PLAN: [name] is a 63 year old female with established CAD and is 13 years status post percutaneous coronary intervention to her proximal RCA in 2007.  She had a 60 - 70% stenosis of her LAD as well as 20-30%. She has experienced recurrent episodes of probable musculoskeletal chest discomfort.  A nuclear stress  test following in 2015 was interpreted as high risk; cardiac catheterization did not reveal significant change in her coronary anatomy and her previously placed RCA stent was widely patent.  Her LAD lesion was not physiologically significant by fractional flow reserve.  Due to exertional dyspnea, she underwent a follow-up echo Doppler study in September 2020 which revealed normal systolic and diastolic function with mild LVH and mild aortic insufficiency.  On exam she does have some mild costochondral tenderness.  She denies anginal symptoms.  When last seen by me, her blood pressure was low and medication adjustments were made.  Blood pressure today  is now stable on her regimen consisting of metoprolol succinate 12.5 mg in the morning and 25 mg at night, isosorbide 60 mg daily, verapamil 120 mg at bedtime and triamterene 37.5/25 mg daily..  She has a prescription for furosemide but rarely takes on a as needed basis.  She continues to be on Plavix 75 mg.  She continues to be on levothyroxine 200 mcg for hypothyroidism.  Laboratory was done by Dr. Forde Dandy which I reviewed.  She is on Polvadera for her mild intermittent asthma without complications.  She continues to be on Victoza and insulin for her diabetes mellitus.  She has peripheral neuropathy and continues to be on pregabalin.  She is tolerating rosuvastatin 5 mg for hyperlipidemia with LDL at target.  Her GERD is controlled with pantoprazole I will see her in 1 year for reevaluation.  Time spent: 25 minutes  Troy Sine, MD, [facility]  07/12/2019 2:58 PM

## 2019-07-10 NOTE — Patient Instructions (Signed)
Medication Instructions:  Continue same medications *If you need a refill on your cardiac medications before your next appointment, please call your pharmacy*  Lab Work: None ordered   Testing/Procedures: None ordered  Follow-Up: At [facility], you and your health needs are our priority.  As part of our continuing mission to provide you with exceptional heart care, we have created designated Provider Care Teams.  These Care Teams include your primary Cardiologist (physician) and Advanced Practice Providers (APPs -  Physician Assistants and Nurse Practitioners) who all work together to provide you with the care you need, when you need it.  Your next appointment:  1 year  Call in Oct to schedule Dec appointment   The format for your next appointment:  Office   Provider:  Dr.Kelly   

## 2019-07-12 ENCOUNTER — Encounter: Payer: Self-pay | Admitting: Cardiovascular Disease

## 2019-08-12 ENCOUNTER — Telehealth: Payer: Self-pay | Admitting: Internal Medicine

## 2019-08-12 NOTE — Telephone Encounter (Signed)
Pt last seen at office 01/27/16. Called and spoke with pt who stated she wanted to establish with a new pulmonologist. Due to how long it has been since she has been seen, I have scheduled her an appt with Dr. Loanne Drilling 1/11. Provided pt with our new office address. Nothing further needed.

## 2019-08-19 ENCOUNTER — Institutional Professional Consult (permissible substitution): Payer: 59 | Admitting: Pulmonary Disease

## 2019-09-05 ENCOUNTER — Ambulatory Visit (INDEPENDENT_AMBULATORY_CARE_PROVIDER_SITE_OTHER): Payer: 59 | Admitting: Critical Care Medicine

## 2019-09-05 ENCOUNTER — Encounter: Payer: Self-pay | Admitting: Critical Care Medicine

## 2019-09-05 ENCOUNTER — Other Ambulatory Visit: Payer: Self-pay

## 2019-09-05 VITALS — BP 118/70 | HR 84 | Temp 97.6°F | Ht 62.0 in | Wt 194.2 lb

## 2019-09-05 DIAGNOSIS — J309 Allergic rhinitis, unspecified: Secondary | ICD-10-CM | POA: Diagnosis not present

## 2019-09-05 DIAGNOSIS — J4551 Severe persistent asthma with (acute) exacerbation: Secondary | ICD-10-CM | POA: Diagnosis not present

## 2019-09-05 MED ORDER — FLUTICASONE PROPIONATE 50 MCG/ACT NA SUSP: 2.0000 | Freq: Every day | NASAL | 11 refills | Status: AC

## 2019-09-05 MED ORDER — MONTELUKAST SODIUM 10 MG PO TABS: 10.0000 mg | ORAL_TABLET | Freq: Every day | ORAL | 11 refills | Status: DC

## 2019-09-05 MED ORDER — IPRATROPIUM-ALBUTEROL 0.5-2.5 (3) MG/3ML IN SOLN: 3.0000 mL | RESPIRATORY_TRACT | 11 refills | Status: DC | PRN

## 2019-09-05 NOTE — Patient Instructions (Addendum)
Thank you for visiting Dr. Carlis Abbott at [facility]. We recommend the following: Orders Placed This Encounter  Procedures  . B Nat Peptide  . IgE  . CBC w/Diff   Orders Placed This Encounter  Procedures  . B Nat Peptide    Standing Status:   Future    Standing Expiration Date:   09/04/2020  . IgE    Standing Status:   Future    Standing Expiration Date:   09/04/2020  . CBC w/Diff    Standing Status:   Future    Standing Expiration Date:   09/04/2020    Meds ordered this encounter  Medications  . fluticasone (FLONASE) 50 MCG/ACT nasal spray    Sig: Place 2 sprays into both nostrils daily.    Dispense:  18.2 mL    Refill:  11  . montelukast (SINGULAIR) 10 MG tablet    Sig: Take 1 tablet (10 mg total) by mouth at bedtime.    Dispense:  30 tablet    Refill:  11  . ipratropium-albuterol (DUONEB) 0.5-2.5 (3) MG/3ML SOLN    Sig: Take 3 mLs by nebulization every 4 (four) hours as needed.    Dispense:  360 mL    Refill:  11    Return in about 4 weeks (around 10/03/2019).    Please do your part to reduce the spread of COVID-19.

## 2019-09-05 NOTE — Progress Notes (Signed)
Synopsis: Referred in January 2021 for SOB by Denise Bowen, MD.   Subjective:   PATIENT ID: [name] GENDER: female DOB: 1956/03/11, MRN: [id number]  Chief Complaint  Patient presents with  . Consult    Patient is here to establish care for asthma. Patient noticed she has been wheezing for the past month. Patient has shortness of breath with exertion. Patient has a dry cough.     [name] is a 64 year old woman who presents for evaluation of shortness of breath, nonproductive cough, and wheezing that have been worse than her baseline for the last 1.5 months.  She is previously a patient of Dr. Janee Morn, but has not been seen in several years.  She has been on Breo 200 and using albuterol and duo nebs frequently throughout the day.  Her primary care physician's office is working on a prior authorization for high-dose Trelegy inhaler.  She was started on Breo 2 to 3 months ago after an episode of bronchitis, which initially improved her symptoms, but has stopped working.  Her PCP tried adding Incruse, which she was unable to tolerate due to coughing.  The albuterol and duo nebs are helping, but she has persistent constant wheezing.  She has severe shortness of breath limiting her activity; she is short of breath with bathing and she would be unable to walk a block on flat ground. She has had increased leg and foot edema recently, for which she has been taking Lasix as needed.  Her breathing improved after taking Lasix.  She is reluctant to try this daily due to concern for kidney injury.  She has previously been evaluated in the fall by cardiology, who did not feel that her dyspnea is cardiac in origin.  She has allergic rhinosinusitis with sinus pain, thick sinus drainage, postnasal drip, for which she is taking Singulair and Flonase.  She denies fever, chills, sweats, chest pain.  She has a history of GERD, which has been better controlled since she was switched from Protonix to  [place].  She has breakthrough symptoms 1-2 times per week which she treats with Alka-Seltzer.  Her breathing worsens when she is having worse reflux, which is also associated with abdominal bloating.  These were not her symptoms prior to her stent placement when she was having anginal chest pain in the past.  She has had episodes of reflux even with drinking water.  She has been on Voltaren for a while, but is not on other NSAIDs.  Her blood sugars have been uncontrolled recently, and she is reluctant to try prednisone to improve her symptoms.      Past Medical History:  Diagnosis Date  . Acute bronchitis   . Anemia    hx of anemia  . Anginal pain ([place])   . Arthritis    osteo  . Coronary atherosclerosis   . Cough   . Hypertension   . Obstructive sleep apnea (adult) (pediatric)   . Type II or unspecified type diabetes mellitus without mention of complication, not stated as uncontrolled    type 2  . Unspecified hypothyroidism      Family History  Problem Relation Age of Onset  . Hypertension Mother   . Heart disease Mother   . Stroke Mother   . Prostate cancer Father   . Heart disease Brother   . Heart attack Sister      Past Surgical History:  Procedure Laterality Date  . ANTERIOR FUSION CERVICAL SPINE    . LEFT  HEART CATHETERIZATION WITH CORONARY ANGIOGRAM N/A 05/29/2014   Procedure: LEFT HEART CATHETERIZATION WITH CORONARY ANGIOGRAM;  Surgeon: Sinclair Grooms, MD;  Location: [facility] CATH LAB;  Service: Cardiovascular;  Laterality: N/A;  . TUBAL LIGATION    . WISDOM TOOTH EXTRACTION      Social History   Socioeconomic History  . Marital status: Widowed    Spouse name: Not on file  . Number of children: Not on file  . Years of education: Not on file  . Highest education level: Not on file  Occupational History  . Occupation: retired  Tobacco Use  . Smoking status: Former Smoker    Packs/day: 0.10    Years: 30.00    Pack years: 3.00    Quit date: 08/09/2003     Years since quitting: 16.0  . Smokeless tobacco: Never Used  Substance and Sexual Activity  . Alcohol use: No    Alcohol/week: 0.0 standard drinks  . Drug use: No  . Sexual activity: Not on file  Other Topics Concern  . Not on file  Social History Narrative  . Not on file   Social Determinants of Health   Financial Resource Strain:   . Difficulty of Paying Living Expenses: Not on file  Food Insecurity:   . Worried About Charity fundraiser in the Last Year: Not on file  . Ran Out of Food in the Last Year: Not on file  Transportation Needs:   . Lack of Transportation (Medical): Not on file  . Lack of Transportation (Non-Medical): Not on file  Physical Activity:   . Days of Exercise per Week: Not on file  . Minutes of Exercise per Session: Not on file  Stress:   . Feeling of Stress : Not on file  Social Connections:   . Frequency of Communication with Friends and Family: Not on file  . Frequency of Social Gatherings with Friends and Family: Not on file  . Attends Religious Services: Not on file  . Active Member of Clubs or Organizations: Not on file  . Attends Archivist Meetings: Not on file  . Marital Status: Not on file  Intimate Partner Violence:   . Fear of Current or Ex-Partner: Not on file  . Emotionally Abused: Not on file  . Physically Abused: Not on file  . Sexually Abused: Not on file     Allergies  Allergen Reactions  . Codeine Other (See Comments)    REACTION: hallucinations/"loopy"  . Levaquin [Levofloxacin] Nausea And Vomiting  . Sulfa Antibiotics Hives  . Sulfonamide Derivatives Hives     Immunization History  Administered Date(s) Administered  . Fluad Quad(high Dose 65+) 06/05/2019  . Influenza Split 05/10/2011, 05/09/2012, 05/31/2013  . Influenza Whole 05/08/2010  . Influenza,inj,Quad PF,6+ Mos 09/14/2015  . Influenza-Unspecified 06/04/2014  . Pneumococcal Polysaccharide-23 08/09/2007    Outpatient Medications Prior to Visit    Medication Sig Dispense Refill  . albuterol (PROVENTIL HFA) 108 (90 BASE) MCG/ACT inhaler Inhale 2 puffs into the lungs every 6 (six) hours as needed for wheezing or shortness of breath. For shortness of breath and wheezing 1 Inhaler prn  . Calcium Carbonate Antacid (ALKA-SELTZER ANTACID PO) Take by mouth daily as needed.    . clopidogrel (PLAVIX) 75 MG tablet TAKE ONE TABLET BY MOUTH DAILY 30 tablet 2  . dexlansoprazole (DEXILANT) 60 MG capsule Take 60 mg by mouth daily.    . diazepam (VALIUM) 5 MG tablet Take 0.5 tablets by mouth as needed. .5 to  1 tablet prn for muscle cramps    . diclofenac (VOLTAREN) 75 MG EC tablet Take 75 mg by mouth daily as needed. For muscle aches    . furosemide (LASIX) 40 MG tablet Take 1 tablet (40 mg total) by mouth daily as needed for fluid. 90 tablet 3  . insulin glargine (LANTUS) 100 UNIT/ML injection Inject 35 Units into the skin daily.    . insulin lispro (HUMALOG) 100 UNIT/ML injection Inject 5 Units into the skin daily as needed for high blood sugar (sliding scale).     . isosorbide mononitrate (IMDUR) 60 MG 24 hr tablet Take 1 tablet (60 mg total) by mouth daily. 90 tablet 3  . levothyroxine (SYNTHROID, LEVOTHROID) 200 MCG tablet Take 200 mcg by mouth daily.    . metoprolol succinate (TOPROL-XL) 25 MG 24 hr tablet Take 12.5 mg ( 1/2 tablet ) in the morning and take 25 mg  ( 1 tablet) at bedtime 180 tablet 3  . NOVOTWIST 32G X 5 MM MISC Use as directed    . omega-3 acid ethyl esters (LOVAZA) 1 g capsule TAKE TWO CAPSULES BY MOUTH TWO TIMES A DAY 120 capsule 0  . ONE TOUCH ULTRA TEST test strip Use as directed    . pantoprazole (PROTONIX) 40 MG tablet Take 40 mg by mouth daily.    . pregabalin (LYRICA) 75 MG capsule Take 75 mg by mouth 2 (two) times daily.    . rosuvastatin (CRESTOR) 5 MG tablet Take 1 tablet (5 mg total) by mouth daily. 90 tablet 3  . triamterene-hydrochlorothiazide (MAXZIDE-25) 37.5-25 MG tablet Take 1 tablet by mouth daily. 90 tablet 3  .  verapamil (CALAN-SR) 120 MG CR tablet Take 1 tablet (120 mg total) by mouth at bedtime. 90 tablet 3  . VICTOZA 18 MG/3ML SOPN Inject 1.8 mg into the skin daily.     Marland Kitchen ipratropium-albuterol (DUONEB) 0.5-2.5 (3) MG/3ML SOLN Take 3 mLs by nebulization every 4 (four) hours as needed.    Marland Kitchen BREO ELLIPTA 200-25 MCG/INH AEPB Inhale 1 puff into the lungs 2 (two) times daily.     No facility-administered medications prior to visit.    Review of Systems  Constitutional: Negative for chills, fever and weight loss.  HENT: Positive for congestion.        Postnasal drip  Respiratory: Positive for cough, shortness of breath and wheezing.   Cardiovascular: Positive for leg swelling. Negative for chest pain.  Gastrointestinal: Positive for heartburn.  Endo/Heme/Allergies: Positive for environmental allergies.     Objective:   Vitals:   09/05/19 1620  BP: 118/70  Pulse: 84  Temp: 97.6 F (36.4 C)  TempSrc: Temporal  SpO2: 93%  Weight: 194 lb 3.2 oz (88.1 kg)  Height: 5\' 2"  (1.575 m)   93% on   RA BMI Readings from Last 3 Encounters:  09/05/19 35.52 kg/m  07/10/19 34.39 kg/m  05/06/19 33.65 kg/m   Wt Readings from Last 3 Encounters:  09/05/19 194 lb 3.2 oz (88.1 kg)  07/10/19 188 lb (85.3 kg)  05/06/19 184 lb (83.5 kg)    Physical Exam Vitals reviewed.  Constitutional:      Appearance: She is obese.  HENT:     Head: Normocephalic and atraumatic.     Nose:     Comments: Deferred due to masking requirement.    Mouth/Throat:     Comments: No significant oral lesions. Eyes:     General: No scleral icterus. Cardiovascular:     Rate and Rhythm: Normal  rate and regular rhythm.  Pulmonary:     Comments: Diffuse wheezing bilaterally, sometimes heard across the room. No conversational dyspnea or accessory muscle use. Abdominal:     Palpations: Abdomen is soft.     Tenderness: There is no abdominal tenderness.  Musculoskeletal:        General: Swelling present. No deformity.      Cervical back: Neck supple.  Lymphadenopathy:     Cervical: No cervical adenopathy.  Skin:    General: Skin is warm and dry.     Findings: No rash.  Neurological:     General: No focal deficit present.     Mental Status: She is alert.     Coordination: Coordination normal.  Psychiatric:        Mood and Affect: Mood normal.        Behavior: Behavior normal.      CBC    Component Value Date/Time   WBC 7.3 03/28/2018 1031   WBC 8.6 11/28/2016 2133   RBC 4.58 03/28/2018 1031   RBC 3.77 (L) 11/28/2016 2133   HGB 10.9 (L) 03/28/2018 1031   HCT 35.4 03/28/2018 1031   PLT 312 03/28/2018 1031   MCV 77 (L) 03/28/2018 1031   MCH 23.8 (L) 03/28/2018 1031   MCH 24.7 (L) 11/28/2016 2133   MCHC 30.8 (L) 03/28/2018 1031   MCHC 31.2 11/28/2016 2133   RDW 17.0 (H) 03/28/2018 1031   LYMPHSABS 3.6 11/28/2016 2133   MONOABS 0.8 11/28/2016 2133   EOSABS 0.5 11/28/2016 2133   BASOSABS 0.1 11/28/2016 2133    CHEMISTRY No results for input(s): NA, K, CL, CO2, GLUCOSE, BUN, CREATININE, CALCIUM, MG, PHOS in the last 168 hours. CrCl cannot be calculated (Patient's most recent lab result is older than the maximum 21 days allowed.).   Chest Imaging- films reviewed: CXR, 2 view 12/08/2015- C-spine hardware, otherwise normal  Pulmonary Functions Testing Results: PFT Results Latest Ref Rng & Units 12/22/2015  FVC-Pre L 2.60  FVC-Predicted Pre % 106  FVC-Post L 2.48  FVC-Predicted Post % 102  Pre FEV1/FVC % % 72  Post FEV1/FCV % % 67  FEV1-Pre L 1.87  FEV1-Predicted Pre % 97  FEV1-Post L 1.67  DLCO UNC% % 60  DLCO COR %Predicted % 66  TLC L 4.76  TLC % Predicted % 100  RV % Predicted % 118   2017- FVC 3.0 (97%) 3.18 (102%, +6%)  FEV1  2.05 (87%) 2.14 (91%, +5%) Ratio 68 TLC 106% RV 119% DLCO 62%    Echocardiogram 04/23/2019: LVEF 60 to 65%, mild LVH.  Normal diastolic function.  Normal LA, RV, RA.  Mild AI, otherwise normal valves.     Assessment & Plan:     ICD-10-CM   1.  Severe persistent asthma with acute exacerbation  J45.51 IgE    CBC w/Diff    fluticasone (FLONASE) 50 MCG/ACT nasal spray    montelukast (SINGULAIR) 10 MG tablet    ipratropium-albuterol (DUONEB) 0.5-2.5 (3) MG/3ML SOLN    B Nat Peptide    B Nat Peptide    CBC w/Diff    IgE    CANCELED: B Nat Peptide  2. Allergic rhinitis, unspecified seasonality, unspecified trigger  J30.9 fluticasone (FLONASE) 50 MCG/ACT nasal spray    montelukast (SINGULAIR) 10 MG tablet     Severe persistent asthma with acute exacerbation -Agree with escalating to triple inhaled therapy-high-dose Trelegy.  For now continue high-dose Breo. -Continue duo nebs every 4 hours as needed or albuterol every 4  hours as needed.  Refilled today. -Continue Singulair -Continue intranasal Flonase -CBC with differential, IgE, BNP -Recommended increasing Lasix to every day given the improvement in her symptoms and leg edema when she uses it, but she is reluctant. -Recommended steroids, which she declined. -Anticipate she will require Biologics to control her asthma. -If she fails to improve or worsens, she should present to the emergency department, which she agreed to.  Chronic allergic rhinosinusitis -Singulair -Intranasal Flonase -Continue antihistamine  GERD -Continue Dexilant -May need to add Pepcid to her regimen -Consider GI evaluation in the future -Recommend maintaining healthy weight  Recommend continued Covid precautions-social distancing, mask wearing, handwashing.  Recommend Covid vaccine when it is available to her.  Up-to-date on seasonal flu vaccine.  Needs Prevnar 13.   Current Outpatient Medications:  .  albuterol (PROVENTIL HFA) 108 (90 BASE) MCG/ACT inhaler, Inhale 2 puffs into the lungs every 6 (six) hours as needed for wheezing or shortness of breath. For shortness of breath and wheezing, Disp: 1 Inhaler, Rfl: prn .  Calcium Carbonate Antacid (ALKA-SELTZER ANTACID PO), Take by mouth daily as  needed., Disp: , Rfl:  .  clopidogrel (PLAVIX) 75 MG tablet, TAKE ONE TABLET BY MOUTH DAILY, Disp: 30 tablet, Rfl: 2 .  dexlansoprazole (DEXILANT) 60 MG capsule, Take 60 mg by mouth daily., Disp: , Rfl:  .  diazepam (VALIUM) 5 MG tablet, Take 0.5 tablets by mouth as needed. .5 to 1 tablet prn for muscle cramps, Disp: , Rfl:  .  diclofenac (VOLTAREN) 75 MG EC tablet, Take 75 mg by mouth daily as needed. For muscle aches, Disp: , Rfl:  .  furosemide (LASIX) 40 MG tablet, Take 1 tablet (40 mg total) by mouth daily as needed for fluid., Disp: 90 tablet, Rfl: 3 .  insulin glargine (LANTUS) 100 UNIT/ML injection, Inject 35 Units into the skin daily., Disp: , Rfl:  .  insulin lispro (HUMALOG) 100 UNIT/ML injection, Inject 5 Units into the skin daily as needed for high blood sugar (sliding scale). , Disp: , Rfl:  .  isosorbide mononitrate (IMDUR) 60 MG 24 hr tablet, Take 1 tablet (60 mg total) by mouth daily., Disp: 90 tablet, Rfl: 3 .  levothyroxine (SYNTHROID, LEVOTHROID) 200 MCG tablet, Take 200 mcg by mouth daily., Disp: , Rfl:  .  metoprolol succinate (TOPROL-XL) 25 MG 24 hr tablet, Take 12.5 mg ( 1/2 tablet ) in the morning and take 25 mg  ( 1 tablet) at bedtime, Disp: 180 tablet, Rfl: 3 .  NOVOTWIST 32G X 5 MM MISC, Use as directed, Disp: , Rfl:  .  omega-3 acid ethyl esters (LOVAZA) 1 g capsule, TAKE TWO CAPSULES BY MOUTH TWO TIMES A DAY, Disp: 120 capsule, Rfl: 0 .  ONE TOUCH ULTRA TEST test strip, Use as directed, Disp: , Rfl:  .  pantoprazole (PROTONIX) 40 MG tablet, Take 40 mg by mouth daily., Disp: , Rfl:  .  pregabalin (LYRICA) 75 MG capsule, Take 75 mg by mouth 2 (two) times daily., Disp: , Rfl:  .  rosuvastatin (CRESTOR) 5 MG tablet, Take 1 tablet (5 mg total) by mouth daily., Disp: 90 tablet, Rfl: 3 .  triamterene-hydrochlorothiazide (MAXZIDE-25) 37.5-25 MG tablet, Take 1 tablet by mouth daily., Disp: 90 tablet, Rfl: 3 .  verapamil (CALAN-SR) 120 MG CR tablet, Take 1 tablet (120 mg total)  by mouth at bedtime., Disp: 90 tablet, Rfl: 3 .  VICTOZA 18 MG/3ML SOPN, Inject 1.8 mg into the skin daily. , Disp: , Rfl:  .  BREO ELLIPTA 200-25 MCG/INH AEPB, Inhale 1 puff into the lungs 2 (two) times daily., Disp: , Rfl:  .  fluticasone (FLONASE) 50 MCG/ACT nasal spray, Place 2 sprays into both nostrils daily., Disp: 18.2 mL, Rfl: 11 .  ipratropium-albuterol (DUONEB) 0.5-2.5 (3) MG/3ML SOLN, Take 3 mLs by nebulization every 4 (four) hours as needed., Disp: 360 mL, Rfl: 11 .  montelukast (SINGULAIR) 10 MG tablet, Take 1 tablet (10 mg total) by mouth at bedtime., Disp: 30 tablet, Rfl: [place] Twilla Khouri, DO [facility] 09/05/2019 6:25 PM

## 2019-09-06 LAB — CBC WITH DIFFERENTIAL/PLATELET
Basophils Absolute: 0.1 10*3/uL (ref 0.0–0.1)
Basophils Relative: 0.8 % (ref 0.0–3.0)
Eosinophils Absolute: 0.8 10*3/uL — ABNORMAL HIGH (ref 0.0–0.7)
Eosinophils Relative: 9.2 % — ABNORMAL HIGH (ref 0.0–5.0)
HCT: 34.5 % — ABNORMAL LOW (ref 36.0–46.0)
Hemoglobin: 10.9 g/dL — ABNORMAL LOW (ref 12.0–15.0)
Lymphocytes Relative: 25.6 % (ref 12.0–46.0)
Lymphs Abs: 2.3 10*3/uL (ref 0.7–4.0)
MCHC: 31.6 g/dL (ref 30.0–36.0)
MCV: 75.9 fl — ABNORMAL LOW (ref 78.0–100.0)
Monocytes Absolute: 0.7 10*3/uL (ref 0.1–1.0)
Monocytes Relative: 8 % (ref 3.0–12.0)
Neutro Abs: 5.1 10*3/uL (ref 1.4–7.7)
Neutrophils Relative %: 56.4 % (ref 43.0–77.0)
Platelets: 280 10*3/uL (ref 150.0–400.0)
RBC: 4.55 Mil/uL (ref 3.87–5.11)
RDW: 16.7 % — ABNORMAL HIGH (ref 11.5–15.5)
WBC: 9 10*3/uL (ref 4.0–10.5)

## 2019-09-06 LAB — IGE: IgE (Immunoglobulin E), Serum: 419 kU/L — ABNORMAL HIGH (ref <=114)

## 2019-09-06 LAB — BRAIN NATRIURETIC PEPTIDE: Brain Natriuretic Peptide: 11 pg/mL (ref <100)

## 2019-09-06 NOTE — Progress Notes (Signed)
Please let [name] know that her antibody and white blood cell numbers that are associated with allergies are elevated and this means we will be looking at injectable asthma medicines. I am not sure that switching from [facility] to Trelegy is going to be enough to control her symptoms.   Can we go ahead and start the paperwork for mepolizumab? I can sign it when I am in the office next week. Thanks!

## 2019-09-10 ENCOUNTER — Other Ambulatory Visit: Payer: Self-pay | Admitting: Critical Care Medicine

## 2019-09-10 ENCOUNTER — Telehealth: Payer: Self-pay | Admitting: Critical Care Medicine

## 2019-09-10 MED ORDER — PREDNISONE 20 MG PO TABS: 40.0000 mg | ORAL_TABLET | Freq: Every day | ORAL | 0 refills | Status: AC

## 2019-09-10 MED ORDER — AMOXICILLIN-POT CLAVULANATE 875-125 MG PO TABS: 1.0000 | ORAL_TABLET | Freq: Two times a day (BID) | ORAL | 0 refills | Status: DC

## 2019-09-10 NOTE — Telephone Encounter (Signed)
Please tell her we should start her on prednisone 40mg  daily x 5 days and a z-pack [facility] will put the orders in). If she is worsening she should go to the ED. If she is not improving with this, please let us know.  Arby Barrette

## 2019-09-10 NOTE — Telephone Encounter (Signed)
Called and spoke with pt letting her know that meds were sent to pharmacy for her and pt verbalized understanding.nothing further needed.

## 2019-09-10 NOTE — Telephone Encounter (Signed)
Called and spoke with pt letting her know that we were going to send in pred Rx and zpak and pt stated that a zpak does not ever help with her symptoms and is requesting to have a different abx to be sent in.

## 2019-09-10 NOTE — Telephone Encounter (Signed)
I sent in Augmentin and the prednisone. Thanks!  LPC

## 2019-09-10 NOTE — Telephone Encounter (Signed)
Called and spoke with pt who stated her asthma is getting worse. Pt said she had worsening SOB, wheezing, and coughing x3 days. Pt said when she coughs, she hears rattling but is unable to get any phlegm up and she says that it is thick.  Pt is doing neb tx at least 3-4 times daily and states she is getting some relief but not much.   Asked pt if she is running a fever and pt stated she was not where she could take her temp but she said that she did feel warm.  Pt states she is taking all of her meds as prescribed.  Pt wants recommendations to help with her symptoms. Dr. Carlis Abbott, please advise on this for pt.

## 2019-09-10 NOTE — Progress Notes (Signed)
Augmentin & prednisone prescribed.  Julian Hy, DO 09/10/19 5:07 PM [facility]

## 2019-09-30 ENCOUNTER — Telehealth: Payer: Self-pay | Admitting: Critical Care Medicine

## 2019-09-30 NOTE — Telephone Encounter (Signed)
Called [facility] and was advised they have attempted to reach pt mulitple times with no success. They reach out to the providers office to help get in touch with the pt. Called and spoke to pt. Informed her [facility] has been trying to reach pt. Gave pt phone number [phone] opt 1). Pt verbalized understanding and denied any further questions or concerns at this time.

## 2019-10-01 ENCOUNTER — Other Ambulatory Visit: Payer: Self-pay

## 2019-10-01 ENCOUNTER — Ambulatory Visit (INDEPENDENT_AMBULATORY_CARE_PROVIDER_SITE_OTHER): Payer: 59 | Admitting: Critical Care Medicine

## 2019-10-01 ENCOUNTER — Telehealth: Payer: Self-pay | Admitting: Critical Care Medicine

## 2019-10-01 ENCOUNTER — Encounter: Payer: Self-pay | Admitting: Critical Care Medicine

## 2019-10-01 VITALS — BP 128/70 | HR 86 | Temp 97.3°F | Ht 62.0 in | Wt 195.0 lb

## 2019-10-01 DIAGNOSIS — J4550 Severe persistent asthma, uncomplicated: Secondary | ICD-10-CM | POA: Diagnosis not present

## 2019-10-01 DIAGNOSIS — J8283 Eosinophilic asthma: Secondary | ICD-10-CM | POA: Diagnosis not present

## 2019-10-01 DIAGNOSIS — J309 Allergic rhinitis, unspecified: Secondary | ICD-10-CM

## 2019-10-01 MED ORDER — TRELEGY ELLIPTA 200-62.5-25 MCG/INH IN AEPB: 2.0000 | INHALATION_SPRAY | Freq: Every day | RESPIRATORY_TRACT | 0 refills | Status: DC

## 2019-10-01 MED ORDER — TRELEGY ELLIPTA 200-62.5-25 MCG/INH IN AEPB: 1.0000 | INHALATION_SPRAY | Freq: Every day | RESPIRATORY_TRACT | 11 refills | Status: DC

## 2019-10-01 MED ORDER — FLUTTER DEVI: 1.0000 | Freq: Every day | 0 refills | Status: AC

## 2019-10-01 NOTE — Addendum Note (Signed)
Addended by: Lia Foyer R on: 10/01/2019 01:51 PM   Modules accepted: Orders

## 2019-10-01 NOTE — Telephone Encounter (Signed)
Enrollment forms for [employer] were brought to me by Lia Foyer, [place]. Forms and insurance cards have been faxed to [facility] [place]. Will await summary of benefits.

## 2019-10-01 NOTE — Progress Notes (Signed)
Synopsis: Referred in January 2021 for SOB by Reynold Bowen, MD.   Subjective:   PATIENT ID: [name] GENDER: female DOB: [birth year], MRN: [id number]  No chief complaint on file.   [name] is a 64 y/o woman with a history of severe persistent allergic asthma who presents for follow up. Since her last visit she did require augmentin and antibiotics for her exacerbation and has had improvement in her symptoms. She continues to have activity limitation due to DOE and she has more wheezing with exertion. She still has some cough and minimal yellow sputum. She was not able to get Trelegy despite her PCP doing a PA and she has remained on Breo. She continues to need albuterol frequently. She has been using her antihistamine daily and flonase PRN when she has congestion. She is UTD on her flu shot and is scheduled for her first covid vaccine tomorrow.      OV 09/05/19: [name] is a 64 year old woman who presents for evaluation of shortness of breath, nonproductive cough, and wheezing that have been worse than her baseline for the last 1.5 months.  She is previously a patient of Dr. Janee Morn, but has not been seen in several years.  She has been on Breo 200 and using albuterol and duo nebs frequently throughout the day.  Her primary care physician's office is working on a prior authorization for high-dose Trelegy inhaler.  She was started on Breo 2 to 3 months ago after an episode of bronchitis, which initially improved her symptoms, but has stopped working.  Her PCP tried adding Incruse, which she was unable to tolerate due to coughing.  The albuterol and duo nebs are helping, but she has persistent constant wheezing.  She has severe shortness of breath limiting her activity; she is short of breath with bathing and she would be unable to walk a block on flat ground. She has had increased leg and foot edema recently, for which she has been taking Lasix as needed.  Her breathing improved  after taking Lasix.  She is reluctant to try this daily due to concern for kidney injury.  She has previously been evaluated in the fall by cardiology, who did not feel that her dyspnea is cardiac in origin.  She has allergic rhinosinusitis with sinus pain, thick sinus drainage, postnasal drip, for which she is taking Singulair and Flonase.  She denies fever, chills, sweats, chest pain.  She has a history of GERD, which has been better controlled since she was switched from Protonix to [place].  She has breakthrough symptoms 1-2 times per week which she treats with Alka-Seltzer.  Her breathing worsens when she is having worse reflux, which is also associated with abdominal bloating.  These were not her symptoms prior to her stent placement when she was having anginal chest pain in the past.  She has had episodes of reflux even with drinking water.  She has been on Voltaren for a while, but is not on other NSAIDs.  Her blood sugars have been uncontrolled recently, and she is reluctant to try prednisone to improve her symptoms.     Past Medical History:  Diagnosis Date  . Acute bronchitis   . Anemia    hx of anemia  . Anginal pain ([place])   . Arthritis    osteo  . Coronary atherosclerosis   . Cough   . Hypertension   . Obstructive sleep apnea (adult) (pediatric)   . Type II or unspecified type diabetes  mellitus without mention of complication, not stated as uncontrolled    type 2  . Unspecified hypothyroidism      Family History  Problem Relation Age of Onset  . Hypertension Mother   . Heart disease Mother   . Stroke Mother   . Prostate cancer Father   . Heart disease Brother   . Heart attack Sister      Past Surgical History:  Procedure Laterality Date  . ANTERIOR FUSION CERVICAL SPINE    . LEFT HEART CATHETERIZATION WITH CORONARY ANGIOGRAM N/A 05/29/2014   Procedure: LEFT HEART CATHETERIZATION WITH CORONARY ANGIOGRAM;  Surgeon: Sinclair Grooms, MD;  Location: [facility] CATH LAB;   Service: Cardiovascular;  Laterality: N/A;  . TUBAL LIGATION    . WISDOM TOOTH EXTRACTION      Social History   Socioeconomic History  . Marital status: Widowed    Spouse name: Not on file  . Number of children: Not on file  . Years of education: Not on file  . Highest education level: Not on file  Occupational History  . Occupation: retired  Tobacco Use  . Smoking status: Former Smoker    Packs/day: 0.10    Years: 30.00    Pack years: 3.00    Quit date: 08/09/2003    Years since quitting: 16.1  . Smokeless tobacco: Never Used  Substance and Sexual Activity  . Alcohol use: No    Alcohol/week: 0.0 standard drinks  . Drug use: No  . Sexual activity: Not on file  Other Topics Concern  . Not on file  Social History Narrative  . Not on file   Social Determinants of Health   Financial Resource Strain:   . Difficulty of Paying Living Expenses: Not on file  Food Insecurity:   . Worried About Charity fundraiser in the Last Year: Not on file  . Ran Out of Food in the Last Year: Not on file  Transportation Needs:   . Lack of Transportation (Medical): Not on file  . Lack of Transportation (Non-Medical): Not on file  Physical Activity:   . Days of Exercise per Week: Not on file  . Minutes of Exercise per Session: Not on file  Stress:   . Feeling of Stress : Not on file  Social Connections:   . Frequency of Communication with Friends and Family: Not on file  . Frequency of Social Gatherings with Friends and Family: Not on file  . Attends Religious Services: Not on file  . Active Member of Clubs or Organizations: Not on file  . Attends Archivist Meetings: Not on file  . Marital Status: Not on file  Intimate Partner Violence:   . Fear of Current or Ex-Partner: Not on file  . Emotionally Abused: Not on file  . Physically Abused: Not on file  . Sexually Abused: Not on file     Allergies  Allergen Reactions  . Codeine Other (See Comments)    REACTION:  hallucinations/"loopy"  . Levaquin [Levofloxacin] Nausea And Vomiting  . Sulfa Antibiotics Hives  . Sulfonamide Derivatives Hives     Immunization History  Administered Date(s) Administered  . Fluad Quad(high Dose 65+) 06/05/2019  . Influenza Split 05/10/2011, 05/09/2012, 05/31/2013  . Influenza Whole 05/08/2010  . Influenza,inj,Quad PF,6+ Mos 09/14/2015  . Influenza-Unspecified 06/04/2014  . Pneumococcal Polysaccharide-23 08/09/2007    Outpatient Medications Prior to Visit  Medication Sig Dispense Refill  . albuterol (PROVENTIL HFA) 108 (90 BASE) MCG/ACT inhaler Inhale 2 puffs into  the lungs every 6 (six) hours as needed for wheezing or shortness of breath. For shortness of breath and wheezing 1 Inhaler prn  . amoxicillin-clavulanate (AUGMENTIN) 875-125 MG tablet Take 1 tablet by mouth 2 (two) times daily. 14 tablet 0  . BREO ELLIPTA 200-25 MCG/INH AEPB Inhale 1 puff into the lungs 2 (two) times daily.    . Calcium Carbonate Antacid (ALKA-SELTZER ANTACID PO) Take by mouth daily as needed.    . clopidogrel (PLAVIX) 75 MG tablet TAKE ONE TABLET BY MOUTH DAILY 30 tablet 2  . dexlansoprazole (DEXILANT) 60 MG capsule Take 60 mg by mouth daily.    . diazepam (VALIUM) 5 MG tablet Take 0.5 tablets by mouth as needed. .5 to 1 tablet prn for muscle cramps    . diclofenac (VOLTAREN) 75 MG EC tablet Take 75 mg by mouth daily as needed. For muscle aches    . fluticasone (FLONASE) 50 MCG/ACT nasal spray Place 2 sprays into both nostrils daily. 18.2 mL 11  . furosemide (LASIX) 40 MG tablet Take 1 tablet (40 mg total) by mouth daily as needed for fluid. 90 tablet 3  . insulin glargine (LANTUS) 100 UNIT/ML injection Inject 35 Units into the skin daily.    . insulin lispro (HUMALOG) 100 UNIT/ML injection Inject 5 Units into the skin daily as needed for high blood sugar (sliding scale).     Marland Kitchen ipratropium-albuterol (DUONEB) 0.5-2.5 (3) MG/3ML SOLN Take 3 mLs by nebulization every 4 (four) hours as needed.  360 mL 11  . isosorbide mononitrate (IMDUR) 60 MG 24 hr tablet Take 1 tablet (60 mg total) by mouth daily. 90 tablet 3  . levothyroxine (SYNTHROID, LEVOTHROID) 200 MCG tablet Take 200 mcg by mouth daily.    . metoprolol succinate (TOPROL-XL) 25 MG 24 hr tablet Take 12.5 mg ( 1/2 tablet ) in the morning and take 25 mg  ( 1 tablet) at bedtime 180 tablet 3  . montelukast (SINGULAIR) 10 MG tablet Take 1 tablet (10 mg total) by mouth at bedtime. 30 tablet 11  . NOVOTWIST 32G X 5 MM MISC Use as directed    . omega-3 acid ethyl esters (LOVAZA) 1 g capsule TAKE TWO CAPSULES BY MOUTH TWO TIMES A DAY 120 capsule 0  . ONE TOUCH ULTRA TEST test strip Use as directed    . pantoprazole (PROTONIX) 40 MG tablet Take 40 mg by mouth daily.    . pregabalin (LYRICA) 75 MG capsule Take 75 mg by mouth 2 (two) times daily.    . rosuvastatin (CRESTOR) 5 MG tablet Take 1 tablet (5 mg total) by mouth daily. 90 tablet 3  . triamterene-hydrochlorothiazide (MAXZIDE-25) 37.5-25 MG tablet Take 1 tablet by mouth daily. 90 tablet 3  . verapamil (CALAN-SR) 120 MG CR tablet Take 1 tablet (120 mg total) by mouth at bedtime. 90 tablet 3  . VICTOZA 18 MG/3ML SOPN Inject 1.8 mg into the skin daily.      No facility-administered medications prior to visit.    Review of Systems  Constitutional: Negative for chills, fever and weight loss.  HENT: Positive for congestion.        Postnasal drip  Respiratory: Positive for cough, shortness of breath and wheezing.   Cardiovascular: Positive for leg swelling. Negative for chest pain.  Gastrointestinal: Positive for heartburn.  Endo/Heme/Allergies: Positive for environmental allergies.     Objective:   There were no vitals filed for this visit.   on   RA BMI Readings from Last 3 Encounters:  09/05/19 35.52 kg/m  07/10/19 34.39 kg/m  05/06/19 33.65 kg/m   Wt Readings from Last 3 Encounters:  09/05/19 194 lb 3.2 oz (88.1 kg)  07/10/19 188 lb (85.3 kg)  05/06/19 184 lb (83.5  kg)    Physical Exam Vitals reviewed.  Constitutional:      Appearance: Normal appearance. She is not ill-appearing.  HENT:     Head: Normocephalic and atraumatic.  Eyes:     General: No scleral icterus. Cardiovascular:     Rate and Rhythm: Normal rate and regular rhythm.     Heart sounds: No murmur.  Pulmonary:     Comments: Expiratory wheezing, better than last exam. No conversational dyspnea. Abdominal:     General: There is no distension.  Musculoskeletal:        General: No swelling or deformity.     Cervical back: Neck supple.  Lymphadenopathy:     Cervical: No cervical adenopathy.  Skin:    General: Skin is warm and dry.  Neurological:     General: No focal deficit present.     Mental Status: She is alert.     Motor: No weakness.     Coordination: Coordination normal.  Psychiatric:        Mood and Affect: Mood normal.        Behavior: Behavior normal.      CBC    Component Value Date/Time   WBC 9.0 09/05/2019 1701   RBC 4.55 09/05/2019 1701   HGB 10.9 (L) 09/05/2019 1701   HGB 10.9 (L) 03/28/2018 1031   HCT 34.5 (L) 09/05/2019 1701   HCT 35.4 03/28/2018 1031   PLT 280.0 09/05/2019 1701   PLT 312 03/28/2018 1031   MCV 75.9 (L) 09/05/2019 1701   MCV 77 (L) 03/28/2018 1031   MCH 23.8 (L) 03/28/2018 1031   MCH 24.7 (L) 11/28/2016 2133   MCHC 31.6 09/05/2019 1701   RDW 16.7 (H) 09/05/2019 1701   RDW 17.0 (H) 03/28/2018 1031   LYMPHSABS 2.3 09/05/2019 1701   MONOABS 0.7 09/05/2019 1701   EOSABS 0.8 (H) 09/05/2019 1701   BASOSABS 0.1 09/05/2019 1701    CHEMISTRY No results for input(s): NA, K, CL, CO2, GLUCOSE, BUN, CREATININE, CALCIUM, MG, PHOS in the last 168 hours. CrCl cannot be calculated (Patient's most recent lab result is older than the maximum 21 days allowed.).  09/05/2019 labs: IgE 419 Eosinophils 800  Chest Imaging- films reviewed: CXR, 2 view 12/08/2015- C-spine hardware, otherwise normal  Pulmonary Functions Testing Results: PFT  Results Latest Ref Rng & Units 12/22/2015  FVC-Pre L 2.60  FVC-Predicted Pre % 106  FVC-Post L 2.48  FVC-Predicted Post % 102  Pre FEV1/FVC % % 72  Post FEV1/FCV % % 67  FEV1-Pre L 1.87  FEV1-Predicted Pre % 97  FEV1-Post L 1.67  DLCO UNC% % 60  DLCO COR %Predicted % 66  TLC L 4.76  TLC % Predicted % 100  RV % Predicted % 118   2017- FVC 3.0 (97%) 3.18 (102%, +6%)  FEV1  2.05 (87%) 2.14 (91%, +5%) Ratio 68 TLC 106% RV 119% DLCO 62%    Echocardiogram 04/23/2019: LVEF 60 to 65%, mild LVH.  Normal diastolic function.  Normal LA, RV, RA.  Mild AI, otherwise normal valves.     Assessment & Plan:     ICD-10-CM   1. Allergic rhinitis, unspecified seasonality, unspecified trigger  J30.9   2. 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 -con't duonebs or albuterol Q4h PRN -Con't singulair -con't flonase and antihistamine to control allergic rhinits -Recommend mepolizumab injections for optimal asthma control. Follow up through infusion clinic. Discussed the low risk for anaphylaxis, which requires supervision with administration for the first few doses.  Chronic allergic rhinosinusitis -Con't Singulair and Flonase daily. Recommend more regular use of flonase. -Con't oral antihistamine.  GERD -Con't dexilant -Recommend maintaining healthy weight  Recommend continued Covid precautions-social distancing, mask wearing, handwashing.  Agree with Covid vaccine-- no previous history of allergic reactions to vaccines. Up-to-date on seasonal flu vaccine.  Needs Prevnar 13.   Current Outpatient Medications:  .  albuterol (PROVENTIL HFA) 108 (90 BASE) MCG/ACT inhaler, Inhale 2 puffs into the lungs every 6 (six) hours as needed for wheezing or shortness of breath. For shortness of breath and wheezing, Disp:  1 Inhaler, Rfl: prn .  amoxicillin-clavulanate (AUGMENTIN) 875-125 MG tablet, Take 1 tablet by mouth 2 (two) times daily., Disp: 14 tablet, Rfl: 0 .  BREO ELLIPTA 200-25 MCG/INH AEPB, Inhale 1 puff into the lungs 2 (two) times daily., Disp: , Rfl:  .  Calcium Carbonate Antacid (ALKA-SELTZER ANTACID PO), Take by mouth daily as needed., Disp: , Rfl:  .  clopidogrel (PLAVIX) 75 MG tablet, TAKE ONE TABLET BY MOUTH DAILY, Disp: 30 tablet, Rfl: 2 .  dexlansoprazole (DEXILANT) 60 MG capsule, Take 60 mg by mouth daily., Disp: , Rfl:  .  diazepam (VALIUM) 5 MG tablet, Take 0.5 tablets by mouth as needed. .5 to 1 tablet prn for muscle cramps, Disp: , Rfl:  .  diclofenac (VOLTAREN) 75 MG EC tablet, Take 75 mg by mouth daily as needed. For muscle aches, Disp: , Rfl:  .  fluticasone (FLONASE) 50 MCG/ACT nasal spray, Place 2 sprays into both nostrils daily., Disp: 18.2 mL, Rfl: 11 .  furosemide (LASIX) 40 MG tablet, Take 1 tablet (40 mg total) by mouth daily as needed for fluid., Disp: 90 tablet, Rfl: 3 .  insulin glargine (LANTUS) 100 UNIT/ML injection, Inject 35 Units into the skin daily., Disp: , Rfl:  .  insulin lispro (HUMALOG) 100 UNIT/ML injection, Inject 5 Units into the skin daily as needed for high blood sugar (sliding scale). , Disp: , Rfl:  .  ipratropium-albuterol (DUONEB) 0.5-2.5 (3) MG/3ML SOLN, Take 3 mLs by nebulization every 4 (four) hours as needed., Disp: 360 mL, Rfl: 11 .  isosorbide mononitrate (IMDUR) 60 MG 24 hr tablet, Take 1 tablet (60 mg total) by mouth daily., Disp: 90 tablet, Rfl: 3 .  levothyroxine (SYNTHROID, LEVOTHROID) 200 MCG tablet, Take 200 mcg by mouth daily., Disp: , Rfl:  .  metoprolol succinate (TOPROL-XL) 25 MG 24 hr tablet, Take 12.5 mg ( 1/2 tablet ) in the morning and take 25 mg  ( 1 tablet) at bedtime, Disp: 180 tablet, Rfl: 3 .  montelukast (SINGULAIR) 10 MG tablet, Take 1 tablet (10 mg total) by mouth at bedtime., Disp: 30 tablet, Rfl: 11 .  NOVOTWIST 32G X 5 MM  MISC, Use as directed, Disp: , Rfl:  .  omega-3 acid ethyl esters (LOVAZA) 1 g capsule, TAKE TWO CAPSULES BY MOUTH TWO TIMES A DAY, Disp: 120 capsule, Rfl: 0 .  ONE TOUCH ULTRA TEST test strip, Use as directed, Disp: , Rfl:  .  pantoprazole (PROTONIX) 40 MG tablet, Take 40 mg by mouth daily., Disp: , Rfl:  .  pregabalin (LYRICA)  75 MG capsule, Take 75 mg by mouth 2 (two) times daily., Disp: , Rfl:  .  rosuvastatin (CRESTOR) 5 MG tablet, Take 1 tablet (5 mg total) by mouth daily., Disp: 90 tablet, Rfl: 3 .  triamterene-hydrochlorothiazide (MAXZIDE-25) 37.5-25 MG tablet, Take 1 tablet by mouth daily., Disp: 90 tablet, Rfl: 3 .  verapamil (CALAN-SR) 120 MG CR tablet, Take 1 tablet (120 mg total) by mouth at bedtime., Disp: 90 tablet, Rfl: 3 .  VICTOZA 18 MG/3ML SOPN, Inject 1.8 mg into the skin daily. , Disp: , Rfl:      Julian Hy, DO [facility] 10/01/2019 8:26 AM

## 2019-10-01 NOTE — Patient Instructions (Addendum)
Thank you for visiting Dr. Carlis Abbott at [facility]. We recommend the following:   Meds ordered this encounter  Medications  . Fluticasone-Umeclidin-Vilant (TRELEGY ELLIPTA) 200-62.5-25 MCG/INH AEPB    Sig: Inhale 1 puff into the lungs daily.    Dispense:  1 each    Refill:  11    Return in about 3 months (around 12/29/2019).    Please do your part to reduce the spread of COVID-19.

## 2019-10-03 NOTE — Telephone Encounter (Signed)
Received a determination on pt's Nucala PA. PA has been denied under the pt's prescription benefit. Medication may be covered under the pt's medical benefit. Contacted pt's insurance company at [phone]. I was on hold for over 15 minutes with no one coming to the line, will try back.

## 2019-10-03 NOTE — Telephone Encounter (Signed)
Received summary of benefits for Nucala. PA is required. This has been started on Cover My Meds. Key: [id number] Will follow up.

## 2019-10-08 NOTE — Telephone Encounter (Signed)
Tino from [facility] [place] returning Mandi's call.  [phone].  Asking if statement of benefits had been received and and go over pt's coverage for nucala and answer questions.  Please advise.

## 2019-10-10 NOTE — Telephone Encounter (Signed)
[place] to start PA under the pt's medical benefit. PA was started over the phone and was approved 3/3/201 - 04/11/2020. Medication will have to be buy and bill as this approval is through her medical benefit.  LMTCB x1 for pt to discuss and set up first appointment.  We have a dose of Nucala 100mg  single dose vial on hand in the office that had already been purchased through [facility], we will use this dose for the pt.

## 2019-10-11 NOTE — Telephone Encounter (Signed)
LMTCB x2 for pt to discuss and set up first appointment.

## 2019-10-14 NOTE — Telephone Encounter (Signed)
LMTCB x3 for pt to discuss and set up first appointment.  We have attempted to contact pt several times with no success or call back from pt. Per office protocol, message will be closed.

## 2019-10-17 ENCOUNTER — Ambulatory Visit: Payer: 59 | Admitting: Podiatry

## 2019-10-17 ENCOUNTER — Telehealth: Payer: Self-pay | Admitting: Critical Care Medicine

## 2019-10-17 MED ORDER — EPINEPHRINE 0.3 MG/0.3ML IJ SOAJ: 0.3000 mg | Freq: Once | INTRAMUSCULAR | 2 refills | Status: AC

## 2019-10-17 NOTE — Telephone Encounter (Signed)
Pt returned my call from last week. Pt is aware of our office policy when it comes to her first injection >> 2 hours wait, Epipen.  Rx for Epipen has been sent in. First appointment has been scheduled for 10/21/2019 at 1000. Nothing further was needed.

## 2019-10-21 ENCOUNTER — Ambulatory Visit (INDEPENDENT_AMBULATORY_CARE_PROVIDER_SITE_OTHER): Payer: 59

## 2019-10-21 ENCOUNTER — Other Ambulatory Visit: Payer: Self-pay

## 2019-10-21 DIAGNOSIS — J4550 Severe persistent asthma, uncomplicated: Secondary | ICD-10-CM | POA: Diagnosis not present

## 2019-10-21 MED ORDER — MEPOLIZUMAB 100 MG ~~LOC~~ SOLR
100.0000 mg | SUBCUTANEOUS | Status: DC
  Administered 2019-10-21: 100 mg via SUBCUTANEOUS

## 2019-10-21 NOTE — Progress Notes (Signed)
Patient presented to the office today for first-time Nucala injection.  Primary Pulmonologist: Ernest Mallick DO Medication name: Nucala Strength: 100mg  Site(s): L Arm  Epi pen/Auvi-Q visible during appointment: Yes  Time of injection: 1000  Patient evaluated every 15-20 minutes per protocol x2 hours.  1st check: 1020 Evaluation: No Reaction  2nd check: 1040  Evaluation: No Reaction  3rd check: 1100  Evaluation: No Reaction  4th check: 1120   Evaluation: No Reaction  5th check: 1140  Evaluation: No Reaction  6th check: 1200  Evaluation: No Reaction

## 2019-11-05 ENCOUNTER — Telehealth: Payer: Self-pay | Admitting: Critical Care Medicine

## 2019-11-05 NOTE — Telephone Encounter (Signed)
LMTCB x1 for pt. When pt calls back, please reschedule her appointment to an available time slot on the injection schedule.

## 2019-11-05 NOTE — Telephone Encounter (Signed)
Pt's appointment has been rescheduled

## 2019-11-19 ENCOUNTER — Telehealth: Payer: Self-pay | Admitting: Critical Care Medicine

## 2019-11-19 ENCOUNTER — Ambulatory Visit: Payer: 59

## 2019-11-19 NOTE — Telephone Encounter (Signed)
Nucala Order: 100mg  #1 Vial Order Date: 11/19/2019 Expected date of arrival: 11/20/2019 Ordered by: Desmond Dike, [place]  Specialty [facility]: [name]

## 2019-11-20 NOTE — Telephone Encounter (Signed)
Nucala Shipment Received: 100mg  #1 vial Medication arrival date: 11/20/2019 Lot #: [id number] Exp date: 01/2023 Received by: Desmond Dike, [place]

## 2019-11-25 ENCOUNTER — Ambulatory Visit (INDEPENDENT_AMBULATORY_CARE_PROVIDER_SITE_OTHER): Payer: 59

## 2019-11-25 ENCOUNTER — Other Ambulatory Visit: Payer: Self-pay

## 2019-11-25 ENCOUNTER — Ambulatory Visit: Payer: 59

## 2019-11-25 DIAGNOSIS — J4550 Severe persistent asthma, uncomplicated: Secondary | ICD-10-CM

## 2019-11-25 MED ORDER — MEPOLIZUMAB 100 MG ~~LOC~~ SOLR: 100.0000 mg | SUBCUTANEOUS | Status: DC

## 2019-11-25 MED ORDER — MEPOLIZUMAB 100 MG ~~LOC~~ SOLR
100.0000 mg | Freq: Once | SUBCUTANEOUS | Status: AC
  Administered 2019-11-25: 100 mg via SUBCUTANEOUS

## 2019-11-26 ENCOUNTER — Encounter: Payer: Self-pay | Admitting: Physical Therapy

## 2019-11-26 ENCOUNTER — Ambulatory Visit: Payer: 59 | Attending: Specialist | Admitting: Physical Therapy

## 2019-11-26 DIAGNOSIS — R29898 Other symptoms and signs involving the musculoskeletal system: Secondary | ICD-10-CM | POA: Insufficient documentation

## 2019-11-26 DIAGNOSIS — M542 Cervicalgia: Secondary | ICD-10-CM | POA: Insufficient documentation

## 2019-11-26 DIAGNOSIS — M6281 Muscle weakness (generalized): Secondary | ICD-10-CM | POA: Insufficient documentation

## 2019-11-26 NOTE — Therapy (Signed)
[facility] [address]  [place], [name], [address] Phone: [phone]   Fax:  [phone]  Physical Therapy Evaluation  Patient Details  [name] MRN: [id number] Date of Birth: [birth year] Referring Provider (PT): Susa Day, MD   Encounter Date: 11/26/2019  PT End of Session - 11/26/19 1256    Visit Number  1    Number of Visits  13    Date for PT Re-Evaluation  01/07/20    Authorization Type  [employer] & [employer]    PT Start Time  [id number]    PT Stop Time  1010    PT Time Calculation (min)  34 min    Activity Tolerance  Patient tolerated treatment well;Patient limited by pain    Behavior During Therapy  [facility] for tasks assessed/performed       Past Medical History:  Diagnosis Date  . Acute bronchitis   . Anemia    hx of anemia  . Anginal pain ([place])   . Arthritis    osteo  . Coronary atherosclerosis   . Cough   . Hypertension   . Obstructive sleep apnea (adult) (pediatric)   . Type II or unspecified type diabetes mellitus without mention of complication, not stated as uncontrolled    type 2  . Unspecified hypothyroidism     Past Surgical History:  Procedure Laterality Date  . ANTERIOR FUSION CERVICAL SPINE    . LEFT HEART CATHETERIZATION WITH CORONARY ANGIOGRAM N/A 05/29/2014   Procedure: LEFT HEART CATHETERIZATION WITH CORONARY ANGIOGRAM;  Surgeon: Sinclair Grooms, MD;  Location: [facility] CATH LAB;  Service: Cardiovascular;  Laterality: N/A;  . TUBAL LIGATION    . WISDOM TOOTH EXTRACTION      There were no vitals filed for this visit.   Subjective Assessment - 11/26/19 0938    Subjective  Patient reports R sided neck pain of 3 weeks duration. Pain runs down the R arm and with N/T in the R hand. Notes that her sister believes that it is d/t picking up heavy thermal bags of food. Denies B&B changes. Worse with heavy lifting. Hard to find relief- sometimes better when raising the R arm overhead.  Has a hx of cervical fusion 10+ years ago.    Pertinent History  DM II, HTN, angina, anemia, anterior cervical fusion, L heart cath & angio 2015    Limitations  Lifting;House hold activities    Diagnostic tests  none recent    Patient Stated Goals  "be able to use the R hand and get my ROM back"    Currently in Pain?  Yes    Pain Score  7     Pain Location  Neck    Pain Orientation  Right    Pain Descriptors / Indicators  --   stiff   Pain Type  Acute pain;Chronic pain    Pain Radiating Towards  down R arm and into hand         [facility] PT Assessment - 11/26/19 0944      Assessment   Medical Diagnosis  Cervicalgia    Referring Provider (PT)  Susa Day, MD    Onset Date/Surgical Date  11/05/19    Hand Dominance  Right    Next MD Visit  11/29/19    Prior Therapy  yes      Precautions   Precautions  None      Balance Screen   Has the patient fallen in  the past 6 months  No    Has the patient had a decrease in activity level because of a fear of falling?   No    Is the patient reluctant to leave their home because of a fear of falling?   No      Home Film/video editor residence    Living Arrangements  Other relatives   caregiver for sister   Type of [place] to enter    Entrance Stairs-Number of Steps  4 or 2    Home Layout  Two level      Prior Function   Level of Independence  Independent    Vocation  Part time employment    Vocation Requirements  bus driver    Leisure  crochet      Cognition   Overall Cognitive Status  Within Functional Limits for tasks assessed      Sensation   Light Touch  Impaired by gross assessment   c/o decreased sensation on R UE     Coordination   Gross Motor Movements are Fluid and Coordinated  Yes      Posture/Postural Control   Posture/Postural Control  Postural limitations    Postural Limitations  Rounded Shoulders;Forward head;Weight shift left      ROM / Strength   AROM /  PROM / Strength  AROM;Strength      AROM   AROM Assessment Site  Cervical    Cervical Flexion  25    Cervical Extension  25    Cervical - Right Side Bend  24   mild pain   Cervical - Left Side Bend  20   mild pain   Cervical - Right Rotation  25   mild pain   Cervical - Left Rotation  35      Strength   Strength Assessment Site  Shoulder;Elbow;Wrist;Hand    Right/Left Shoulder  Right;Left    Right Shoulder Flexion  4/5   pain   Right Shoulder ABduction  4/5   pain   Right Shoulder Internal Rotation  4/5   pain   Right Shoulder External Rotation  4/5    Left Shoulder Flexion  4+/5    Left Shoulder ABduction  4+/5    Left Shoulder Internal Rotation  4+/5    Left Shoulder External Rotation  4/5    Right/Left Elbow  Right;Left    Right Elbow Flexion  4/5   pain   Right Elbow Extension  4/5   pain   Left Elbow Flexion  4+/5    Left Elbow Extension  4+/5    Right/Left Wrist  Right;Left    Right Wrist Flexion  4+/5    Right Wrist Extension  4+/5    Left Wrist Flexion  4+/5    Left Wrist Extension  4+/5    Right/Left hand  Right;Left    Right Hand Grip (lbs)  9.67   14, 10, 5   Left Hand Grip (lbs)  19.67   24, 20, 15     Palpation   Palpation comment  TTP and increased tone over R pec, UT, LS, proximal biceps tendon and muscle belly, rhomboids                Objective measurements completed on examination: See above findings.              PT Education - 11/26/19 1256    Education  Details  prognosis, POC, HEP    Person(s) Educated  Patient    Methods  Explanation;Demonstration;Tactile cues;Verbal cues;Handout    Comprehension  Verbalized understanding;Returned demonstration       PT Short Term Goals - 11/26/19 1303      PT SHORT TERM GOAL #1   Title  Patient to be independent with initial HEP.    Time  3    Period  Weeks    Status  New    Target Date  12/17/19        PT Long Term Goals - 11/26/19 1303      PT LONG TERM GOAL #1    Title  Patient to be independent with advanced HEP.    Time  6    Period  Weeks    Status  New    Target Date  01/07/20      PT LONG TERM GOAL #2   Title  Patient to demonstrate cervical AROM WFL and without pain limiting.    Time  6    Period  Weeks    Status  New    Target Date  01/07/20      PT LONG TERM GOAL #3   Title  Patient to demonstrate R UE strength >/=4+/5 and symmetricap grip strength.    Time  6    Period  Weeks    Status  New    Target Date  01/07/20      PT LONG TERM GOAL #4   Title  Patient to report 90% improvement in driving tolerance in order to return to work pain-free.    Time  6    Period  Weeks    Status  New    Target Date  01/07/20      PT LONG TERM GOAL #5   Title  Patient to lift 20lb box from floor to counter with good body mechanics and no pain.    Time  6    Period  Weeks    Status  New    Target Date  01/07/20             Plan - 11/26/19 1257    Clinical Impression Statement  Patient is a 64y/o F presenting to OPPT with c/o R acute on chronic R sided neck pain of 3 weeks duration. Pain radiates down the R arm and notes N/T in the R hand. Denies B&B changes. Worse with heavy lifting. Patient today presenting with forward head and rounded posture, limited and painful cervical AROM, decreased R UE strength, decreased R grip strength, and TTP and increased tone over R pec, UT, LS, proximal biceps tendon and muscle belly, and rhomboids. Patient educated on gentle postural correction and stretching HEP- patient reported understanding. Would benefit from skilled PT services 2x/week for 6 weeks to address aforementioned impairments.    Personal Factors and Comorbidities  Age;Sex;Comorbidity 3+;Past/Current Experience;Profession;Time since onset of injury/illness/exacerbation    Comorbidities  DM II, HTN, angina, anemia, anterior cervical fusion, L heart cath & angio 2015    Examination-Activity Limitations   Sleep;Carry;Dressing;Hygiene/Grooming;Lift;Reach Overhead;Self Feeding    Examination-Participation Restrictions  Cleaning;Shop;Community Activity;Driving;Yard Work;Laundry;Meal Prep    Stability/Clinical Decision Making  Stable/Uncomplicated    Clinical Decision Making  Low    Rehab Potential  Good    PT Frequency  2x / week    PT Duration  6 weeks    PT Treatment/Interventions  ADLs/Self Care Home Management;Cryotherapy;Electrical Stimulation;Moist Heat;Traction;Therapeutic exercise;Therapeutic activities;Functional mobility training;Ultrasound;Neuromuscular re-education;Patient/family education;Manual techniques;Taping;Energy  conservation;Dry needling    PT Next Visit Plan  cervical FOTO; postural training, STM    Consulted and Agree with Plan of Care  Patient       Patient will benefit from skilled therapeutic intervention in order to improve the following deficits and impairments:  Increased edema, Decreased activity tolerance, Decreased strength, Increased fascial restricitons, Pain, Impaired UE functional use, Increased muscle spasms, Improper body mechanics, Decreased range of motion, Impaired flexibility, Postural dysfunction  Visit Diagnosis: Cervicalgia  Other symptoms and signs involving the musculoskeletal system  Muscle weakness (generalized)     Problem List Patient Active Problem List   Diagnosis Date Noted  . Acute bronchitis 08/10/2014  . Unstable angina ([place]) 05/29/2014  . Abnormal nuclear stress test 05/29/2014  . Chest pain 05/06/2014  . Exertional shortness of breath 05/06/2014  . Mixed hyperlipidemia 05/06/2014  . Hyperlipidemia with target LDL less than 70 08/05/2013  . HTN (hypertension) 08/05/2013  . Chest wall pain 08/05/2013  . SINUSITIS, ACUTE 04/07/2009  . HYPOTHYROIDISM 12/10/2007  . DIABETES MELLITUS, TYPE II 05/28/2007  . Obstructive sleep apnea 05/28/2007  . Coronary atherosclerosis 05/28/2007  . Seasonal and perennial allergic rhinitis  05/28/2007  . COUGH, CHRONIC 05/28/2007     Janene Harvey, PT, DPT 11/26/19 1:06 PM   [facility] [address]  [place], [name], [address] Phone: [phone]   Fax:  [phone]  [name] MRN: [id number] Date of Birth: 03/19/1956

## 2019-11-29 ENCOUNTER — Other Ambulatory Visit: Payer: Self-pay | Admitting: Specialist

## 2019-11-29 DIAGNOSIS — M542 Cervicalgia: Secondary | ICD-10-CM

## 2019-12-03 ENCOUNTER — Ambulatory Visit: Payer: 59 | Admitting: Physical Therapy

## 2019-12-03 ENCOUNTER — Other Ambulatory Visit: Payer: Self-pay

## 2019-12-03 ENCOUNTER — Encounter: Payer: Self-pay | Admitting: Physical Therapy

## 2019-12-03 VITALS — BP 122/75 | HR 75

## 2019-12-03 DIAGNOSIS — M542 Cervicalgia: Secondary | ICD-10-CM | POA: Diagnosis not present

## 2019-12-03 DIAGNOSIS — M6281 Muscle weakness (generalized): Secondary | ICD-10-CM

## 2019-12-03 DIAGNOSIS — R29898 Other symptoms and signs involving the musculoskeletal system: Secondary | ICD-10-CM

## 2019-12-03 NOTE — Therapy (Signed)
[facility] [address]  [place], [name], [address] Phone: [phone]   Fax:  [phone]  Physical Therapy Treatment  Patient Details  [name] MRN: [id number] Date of Birth: 29-May-1956 Referring Provider (PT): Susa Day, MD   Encounter Date: 12/03/2019  PT End of Session - 12/03/19 1057    Visit Number  2    Number of Visits  13    Date for PT Re-Evaluation  01/07/20    Authorization Type  [employer]HC & [employer]    PT Start Time  1017    PT Stop Time  1057    PT Time Calculation (min)  40 min    Activity Tolerance  Patient tolerated treatment well;Patient limited by pain    Behavior During Therapy  [facility] for tasks assessed/performed       Past Medical History:  Diagnosis Date  . Acute bronchitis   . Anemia    hx of anemia  . Anginal pain ([place])   . Arthritis    osteo  . Coronary atherosclerosis   . Cough   . Hypertension   . Obstructive sleep apnea (adult) (pediatric)   . Type II or unspecified type diabetes mellitus without mention of complication, not stated as uncontrolled    type 2  . Unspecified hypothyroidism     Past Surgical History:  Procedure Laterality Date  . ANTERIOR FUSION CERVICAL SPINE    . LEFT HEART CATHETERIZATION WITH CORONARY ANGIOGRAM N/A 05/29/2014   Procedure: LEFT HEART CATHETERIZATION WITH CORONARY ANGIOGRAM;  Surgeon: Sinclair Grooms, MD;  Location: [facility] CATH LAB;  Service: Cardiovascular;  Laterality: N/A;  . TUBAL LIGATION    . WISDOM TOOTH EXTRACTION      Vitals:   12/03/19 1019  BP: 122/75  Pulse: 75  SpO2: 97%    Subjective Assessment - 12/03/19 1019    Subjective  Feeling less pain over the R lateral shoulder and less N/T in the 4th digit, however the paresthesias in the 5th digit are ongoing. Feeling sick to her stomach this AM. Has not taken her blood glucose this AM.    Pertinent History  DM II, HTN, angina, anemia, anterior cervical fusion, L  heart cath & angio 2015    Diagnostic tests  none recent    Patient Stated Goals  "be able to use the R hand and get my ROM back"    Currently in Pain?  Yes    Pain Score  6     Pain Location  Shoulder    Pain Orientation  Right    Pain Descriptors / Indicators  Aching    Pain Type  Acute pain;Chronic pain    Pain Radiating Towards  down the R arm into hand         [facility] PT Assessment - 12/03/19 0001      Observation/Other Assessments   Focus on Therapeutic Outcomes (FOTO)   Neck: 53 (47% limited, 33% predicted)                   [facility] Adult PT Treatment/Exercise - 12/03/19 0001      Exercises   Exercises  Neck;Shoulder      Neck Exercises: Machines for Strengthening   UBE (Upper Arm Bike)  L1 x 80min forward/3 min back      Neck Exercises: Seated   Neck Retraction  10 reps;3 secs    Neck Retraction Limitations  good form; 2nd set  with yellow TB      Shoulder Exercises: Standing   Row  Strengthening;Both;10 reps;Theraband    Theraband Level (Shoulder Row)  Level 2 (Red)    Row Limitations  good effort to maintain scapular depression and retraction      Neck Exercises: Stretches   Upper Trapezius Stretch  Right;Left;1 rep;30 seconds    Upper Trapezius Stretch Limitations  strap assistance    Corner Stretch  1 rep;30 seconds    Corner Stretch Limitations  mid pec stretch in doorway   cues for proper alignment; c/o R shoulder pain            PT Education - 12/03/19 1057    Education Details  update to HEP; omitted pec stretch d/t shoulder pain    Person(s) Educated  Patient    Methods  Explanation;Demonstration;Tactile cues;Verbal cues;Handout    Comprehension  Verbalized understanding;Returned demonstration       PT Short Term Goals - 12/03/19 1146      PT SHORT TERM GOAL #1   Title  Patient to be independent with initial HEP.    Time  3    Period  Weeks    Status  On-going    Target Date  12/17/19        PT Long Term Goals - 12/03/19  1146      PT LONG TERM GOAL #1   Title  Patient to be independent with advanced HEP.    Time  6    Period  Weeks    Status  On-going      PT LONG TERM GOAL #2   Title  Patient to demonstrate cervical AROM WFL and without pain limiting.    Time  6    Period  Weeks    Status  On-going      PT LONG TERM GOAL #3   Title  Patient to demonstrate R UE strength >/=4+/5 and symmetricap grip strength.    Time  6    Period  Weeks    Status  On-going      PT LONG TERM GOAL #4   Title  Patient to report 90% improvement in driving tolerance in order to return to work pain-free.    Time  6    Period  Weeks    Status  On-going      PT LONG TERM GOAL #5   Title  Patient to lift 20lb box from floor to counter with good body mechanics and no pain.    Time  6    Period  Weeks    Status  On-going            Plan - 12/03/19 1145    Clinical Impression Statement  Patient reporting improvement in R shoulder pain and paresthesias in R 4th digit, however notes that the N/T in the 5th digit is ongoing. Reporting feeling sick to her stomach this AM but agreeable to continue with session. Vitals stable. Demonstrated good form with cervical retractions, thus applied resistance with good maintenance of proper technique. Patient demonstrated good effort to maintain scapular depression and retraction with initiation of rows. Reported c/o R shoulder pain with pec stretch which was no better with modifications, thus omitted this from HEP. Administered new HEP to include resisted rows and cervical retraction. Patient reported understanding and without complaints at end of session.    Comorbidities  DM II, HTN, angina, anemia, anterior cervical fusion, L heart cath & angio 2015    PT  Treatment/Interventions  ADLs/Self Care Home Management;Cryotherapy;Electrical Stimulation;Moist Heat;Traction;Therapeutic exercise;Therapeutic activities;Functional mobility training;Ultrasound;Neuromuscular  re-education;Patient/family education;Manual techniques;Taping;Energy conservation;Dry needling    PT Next Visit Plan  postural training, STM    Consulted and Agree with Plan of Care  Patient       Patient will benefit from skilled therapeutic intervention in order to improve the following deficits and impairments:  Increased edema, Decreased activity tolerance, Decreased strength, Increased fascial restricitons, Pain, Impaired UE functional use, Increased muscle spasms, Improper body mechanics, Decreased range of motion, Impaired flexibility, Postural dysfunction  Visit Diagnosis: Cervicalgia  Other symptoms and signs involving the musculoskeletal system  Muscle weakness (generalized)     Problem List Patient Active Problem List   Diagnosis Date Noted  . Acute bronchitis 08/10/2014  . Unstable angina ([place]) 05/29/2014  . Abnormal nuclear stress test 05/29/2014  . Chest pain 05/06/2014  . Exertional shortness of breath 05/06/2014  . Mixed hyperlipidemia 05/06/2014  . Hyperlipidemia with target LDL less than 70 08/05/2013  . HTN (hypertension) 08/05/2013  . Chest wall pain 08/05/2013  . SINUSITIS, ACUTE 04/07/2009  . HYPOTHYROIDISM 12/10/2007  . DIABETES MELLITUS, TYPE II 05/28/2007  . Obstructive sleep apnea 05/28/2007  . Coronary atherosclerosis 05/28/2007  . Seasonal and perennial allergic rhinitis 05/28/2007  . COUGH, CHRONIC 05/28/2007    Janene Harvey, PT, DPT 12/03/19 11:47 AM   [facility] [place], [name], [address] Phone: [phone]   Fax:  [phone]  Name: DOMNIQUE VANEGAS MRN: [id number] Date of Birth: 28-Mar-1956

## 2019-12-05 ENCOUNTER — Ambulatory Visit: Payer: 59

## 2019-12-05 ENCOUNTER — Other Ambulatory Visit: Payer: Self-pay

## 2019-12-05 DIAGNOSIS — M542 Cervicalgia: Secondary | ICD-10-CM | POA: Diagnosis not present

## 2019-12-05 DIAGNOSIS — R29898 Other symptoms and signs involving the musculoskeletal system: Secondary | ICD-10-CM

## 2019-12-05 DIAGNOSIS — M6281 Muscle weakness (generalized): Secondary | ICD-10-CM

## 2019-12-05 NOTE — Therapy (Signed)
[facility] [address]  [place], [name], [address] Phone: [phone]   Fax:  [phone]  Physical Therapy Treatment  Patient Details  [name] MRN: [id number] Date of Birth: 12/24/1955 Referring Provider (PT): Susa Day, MD   Encounter Date: 12/05/2019  PT End of Session - 12/05/19 1547    Visit Number  3    Number of Visits  13    Date for PT Re-Evaluation  01/07/20    Authorization Type  [employer]HC & [employer]    PT Start Time  4332    PT Stop Time  1615    PT Time Calculation (min)  41 min    Activity Tolerance  Patient tolerated treatment well;Patient limited by pain    Behavior During Therapy  [facility] for tasks assessed/performed       Past Medical History:  Diagnosis Date  . Acute bronchitis   . Anemia    hx of anemia  . Anginal pain ([place])   . Arthritis    osteo  . Coronary atherosclerosis   . Cough   . Hypertension   . Obstructive sleep apnea (adult) (pediatric)   . Type II or unspecified type diabetes mellitus without mention of complication, not stated as uncontrolled    type 2  . Unspecified hypothyroidism     Past Surgical History:  Procedure Laterality Date  . ANTERIOR FUSION CERVICAL SPINE    . LEFT HEART CATHETERIZATION WITH CORONARY ANGIOGRAM N/A 05/29/2014   Procedure: LEFT HEART CATHETERIZATION WITH CORONARY ANGIOGRAM;  Surgeon: Sinclair Grooms, MD;  Location: [facility] CATH LAB;  Service: Cardiovascular;  Laterality: N/A;  . TUBAL LIGATION    . WISDOM TOOTH EXTRACTION      There were no vitals filed for this visit.  Subjective Assessment - 12/05/19 1546    Subjective  Pt. noting regular HEP performance.    Pertinent History  DM II, HTN, angina, anemia, anterior cervical fusion, L heart cath & angio 2015    Diagnostic tests  none recent    Patient Stated Goals  "be able to use the R hand and get my ROM back"    Currently in Pain?  No/denies    Pain Score  0-No pain   up  to a 7/10 at times   Pain Location  Shoulder    Pain Orientation  Right    Pain Descriptors / Indicators  Aching    Pain Type  Acute pain;Chronic pain    Multiple Pain Sites  No                       [facility] Adult PT Treatment/Exercise - 12/05/19 0001      Neck Exercises: Machines for Strengthening   UBE (Upper Arm Bike)  L1 x 38min forward/3 min back      Neck Exercises: Seated   Neck Retraction  10 reps;5 secs    Neck Retraction Limitations  + scapular retraction       Neck Exercises: Supine   Neck Retraction  10 reps;5 secs    Neck Retraction Limitations  + scap retraction      Shoulder Exercises: Standing   Row  Strengthening;Both;10 reps;Theraband    Theraband Level (Shoulder Row)  Level 2 (Red)    Row Limitations  cues for full scapular retraction       Manual Therapy   Manual Therapy  Soft tissue mobilization;Myofascial release;Passive ROM  Manual therapy comments  sitting     Soft tissue mobilization  STM/DTM to B UT, LS    Myofascial Release  TPR to L UT    Passive ROM  Manual B side bending, cervical rot stretches with therapist       Neck Exercises: Stretches   Upper Trapezius Stretch  Right;Left;1 rep;30 seconds    Upper Trapezius Stretch Limitations  hand anchored on chair     Corner Stretch  1 rep;30 seconds    Corner Stretch Limitations  mid pec stretch in doorway               PT Short Term Goals - 12/03/19 1146      PT SHORT TERM GOAL #1   Title  Patient to be independent with initial HEP.    Time  3    Period  Weeks    Status  On-going    Target Date  12/17/19        PT Long Term Goals - 12/03/19 1146      PT LONG TERM GOAL #1   Title  Patient to be independent with advanced HEP.    Time  6    Period  Weeks    Status  On-going      PT LONG TERM GOAL #2   Title  Patient to demonstrate cervical AROM WFL and without pain limiting.    Time  6    Period  Weeks    Status  On-going      PT LONG TERM GOAL #3   Title   Patient to demonstrate R UE strength >/=4+/5 and symmetricap grip strength.    Time  6    Period  Weeks    Status  On-going      PT LONG TERM GOAL #4   Title  Patient to report 90% improvement in driving tolerance in order to return to work pain-free.    Time  6    Period  Weeks    Status  On-going      PT LONG TERM GOAL #5   Title  Patient to lift 20lb box from floor to counter with good body mechanics and no pain.    Time  6    Period  Weeks    Status  On-going            Plan - 12/05/19 1548    Clinical Impression Statement Pt. reports regular HEP performance.  Tolerated all postural stretching and strengthening activities well today without increased pain.  MT focused on improving tissue quality in upper shoulders and cervical musculature.  Able to end session pain free thus modalities deferred.     Comorbidities  DM II, HTN, angina, anemia, anterior cervical fusion, L heart cath & angio 2015    Rehab Potential  Good    PT Frequency  2x / week    PT Treatment/Interventions  ADLs/Self Care Home Management;Cryotherapy;Electrical Stimulation;Moist Heat;Traction;Therapeutic exercise;Therapeutic activities;Functional mobility training;Ultrasound;Neuromuscular re-education;Patient/family education;Manual techniques;Taping;Energy conservation;Dry needling    PT Next Visit Plan  postural training, STM    Consulted and Agree with Plan of Care  Patient       Patient will benefit from skilled therapeutic intervention in order to improve the following deficits and impairments:  Increased edema, Decreased activity tolerance, Decreased strength, Increased fascial restricitons, Pain, Impaired UE functional use, Increased muscle spasms, Improper body mechanics, Decreased range of motion, Impaired flexibility, Postural dysfunction  Visit Diagnosis: Cervicalgia  Other symptoms and signs involving  the musculoskeletal system  Muscle weakness (generalized)     Problem List Patient  Active Problem List   Diagnosis Date Noted  . Acute bronchitis 08/10/2014  . Unstable angina ([place]) 05/29/2014  . Abnormal nuclear stress test 05/29/2014  . Chest pain 05/06/2014  . Exertional shortness of breath 05/06/2014  . Mixed hyperlipidemia 05/06/2014  . Hyperlipidemia with target LDL less than 70 08/05/2013  . HTN (hypertension) 08/05/2013  . Chest wall pain 08/05/2013  . SINUSITIS, ACUTE 04/07/2009  . HYPOTHYROIDISM 12/10/2007  . DIABETES MELLITUS, TYPE II 05/28/2007  . Obstructive sleep apnea 05/28/2007  . Coronary atherosclerosis 05/28/2007  . Seasonal and perennial allergic rhinitis 05/28/2007  . COUGH, CHRONIC 05/28/2007    Bess Harvest, PTA 12/05/19 6:27 PM   [facility] [address]  [place], [name], [address] Phone: [phone]   Fax:  [phone]  [name] MRN: [id number] Date of Birth: 06/06/1956

## 2019-12-10 ENCOUNTER — Other Ambulatory Visit: Payer: Self-pay

## 2019-12-10 ENCOUNTER — Ambulatory Visit: Payer: 59 | Attending: Specialist | Admitting: Physical Therapy

## 2019-12-10 ENCOUNTER — Other Ambulatory Visit (HOSPITAL_BASED_OUTPATIENT_CLINIC_OR_DEPARTMENT_OTHER): Payer: Self-pay | Admitting: Endocrinology

## 2019-12-10 ENCOUNTER — Encounter: Payer: Self-pay | Admitting: Physical Therapy

## 2019-12-10 DIAGNOSIS — M6281 Muscle weakness (generalized): Secondary | ICD-10-CM | POA: Diagnosis present

## 2019-12-10 DIAGNOSIS — R29898 Other symptoms and signs involving the musculoskeletal system: Secondary | ICD-10-CM | POA: Diagnosis present

## 2019-12-10 DIAGNOSIS — M542 Cervicalgia: Secondary | ICD-10-CM | POA: Insufficient documentation

## 2019-12-10 DIAGNOSIS — Z1231 Encounter for screening mammogram for malignant neoplasm of breast: Secondary | ICD-10-CM

## 2019-12-10 NOTE — Therapy (Signed)
[facility] [address]  [place], [name], [address] Phone: [phone]   Fax:  [phone]  Physical Therapy Treatment  Patient Details  [name] MRN: [id number] Date of Birth: 1955/08/10 Referring Provider (PT): Susa Day, MD   Encounter Date: 12/10/2019  PT End of Session - 12/10/19 1150    Visit Number  4    Number of Visits  13    Date for PT Re-Evaluation  01/07/20    Authorization Type  [employer] & [employer]    PT Start Time  1101    PT Stop Time  1147    PT Time Calculation (min)  46 min    Activity Tolerance  Patient tolerated treatment well;Patient limited by pain    Behavior During Therapy  [facility] for tasks assessed/performed       Past Medical History:  Diagnosis Date  . Acute bronchitis   . Anemia    hx of anemia  . Anginal pain ([place])   . Arthritis    osteo  . Coronary atherosclerosis   . Cough   . Hypertension   . Obstructive sleep apnea (adult) (pediatric)   . Type II or unspecified type diabetes mellitus without mention of complication, not stated as uncontrolled    type 2  . Unspecified hypothyroidism     Past Surgical History:  Procedure Laterality Date  . ANTERIOR FUSION CERVICAL SPINE    . LEFT HEART CATHETERIZATION WITH CORONARY ANGIOGRAM N/A 05/29/2014   Procedure: LEFT HEART CATHETERIZATION WITH CORONARY ANGIOGRAM;  Surgeon: Sinclair Grooms, MD;  Location: [facility] CATH LAB;  Service: Cardiovascular;  Laterality: N/A;  . TUBAL LIGATION    . WISDOM TOOTH EXTRACTION      There were no vitals filed for this visit.  Subjective Assessment - 12/10/19 1103    Subjective  Still feeling a bit nauseous intermittenly and does not know why. Has a cervical MRI on 05/22.    Pertinent History  DM II, HTN, angina, anemia, anterior cervical fusion, L heart cath & angio 2015    Diagnostic tests  none recent    Patient Stated Goals  "be able to use the R hand and get my ROM back"    Currently in Pain?  Yes    Pain Score  2     Pain Location  Shoulder    Pain Orientation  Right    Pain Descriptors / Indicators  Aching    Pain Type  Acute pain;Chronic pain    Pain Radiating Towards  down the forearm which is 6/10 pain                       [facility] Adult PT Treatment/Exercise - 12/10/19 0001      Self-Care   Self-Care  Other Self-Care Comments    Other Self-Care Comments   edu and demonstration of use o 2 tennis balls for self-release of B suboccipitals      Neck Exercises: Machines for Strengthening   UBE (Upper Arm Bike)  L1.5 x 51min forward/3 min back      Shoulder Exercises: Supine   Horizontal ABduction  Strengthening;Both;10 reps;Theraband    Theraband Level (Shoulder Horizontal ABduction)  Level 1 (Yellow)    Horizontal ABduction Limitations  better tolerance and ROM    External Rotation  Strengthening;Both;10 reps;Theraband    External Rotation Limitations  c/o R shoulder pain, thus discontinued      Shoulder  Exercises: Standing   Horizontal ABduction  Strengthening;Both;10 reps;Theraband    Theraband Level (Shoulder Horizontal ABduction)  Level 1 (Yellow)    Horizontal ABduction Limitations  standing against doorframe   limited ROM and pain in the medial upper arm   Extension  Strengthening;Both;10 reps;Theraband    Extension Limitations  cues to depress shoulders      Manual Therapy   Manual Therapy  Soft tissue mobilization;Myofascial release;Manual Traction    Manual therapy comments  supine    Soft tissue mobilization  STM/DTM to R scalenes, LS, B suboccipitals, cervical paraspinals- trigger point evident over scalenes    Myofascial Release  manual TPR to R scalenes; B suboccipital release    Manual Traction  gentle cervical traction with towel assist to tolerance 3x30"    report of improvement in R forearm peripheralization              PT Short Term Goals - 12/10/19 1157      PT SHORT TERM GOAL #1   Title  Patient  to be independent with initial HEP.    Time  3    Period  Weeks    Status  Achieved    Target Date  12/17/19        PT Long Term Goals - 12/03/19 1146      PT LONG TERM GOAL #1   Title  Patient to be independent with advanced HEP.    Time  6    Period  Weeks    Status  On-going      PT LONG TERM GOAL #2   Title  Patient to demonstrate cervical AROM WFL and without pain limiting.    Time  6    Period  Weeks    Status  On-going      PT LONG TERM GOAL #3   Title  Patient to demonstrate R UE strength >/=4+/5 and symmetricap grip strength.    Time  6    Period  Weeks    Status  On-going      PT LONG TERM GOAL #4   Title  Patient to report 90% improvement in driving tolerance in order to return to work pain-free.    Time  6    Period  Weeks    Status  On-going      PT LONG TERM GOAL #5   Title  Patient to lift 20lb box from floor to counter with good body mechanics and no pain.    Time  6    Period  Weeks    Status  On-going            Plan - 12/10/19 1150    Clinical Impression Statement  Patient still noting intermittent nausea without known cause, but reporting decreased R shoulder pain today. Worked on periscapular strengthening in standing, however patient limited by R shoulder pain. Initiated these exercises in supine, which was better tolerated and patient with improved ROM as well. Patient tolerated STM, TPR, cervical manual distraction for pain relief. Trigger point evident in R scalenes and patient with considerable tenderness in R suboccipitals, but with excellent response to MT. Patient reported a decrease in peripheralizing pain in R forearm from 6/10 at beginning of session to 2-3/10 at end of session. Educated patient on use of 2 tennis balls for self-release of B suboccipitals at home. Patient reported understanding and without complaints at end of session. Patient progressing well towards goals despite underlying R shoulder pain. Spoke to patient about  possible mechanical traction of the cervical spine, however upon second review of patient's PMH, patient with hx of anterior cervical fusion, therefore will continue with gentle manual traction for muscle stretch and continued pain relief.    Comorbidities  DM II, HTN, angina, anemia, anterior cervical fusion, L heart cath & angio 2015    Rehab Potential  Good    PT Frequency  2x / week    PT Treatment/Interventions  ADLs/Self Care Home Management;Cryotherapy;Electrical Stimulation;Moist Heat;Traction;Therapeutic exercise;Therapeutic activities;Functional mobility training;Ultrasound;Neuromuscular re-education;Patient/family education;Manual techniques;Taping;Energy conservation;Dry needling    PT Next Visit Plan  postural training, STM, no mechanical traction d/t hx cervical fusion    Consulted and Agree with Plan of Care  Patient       Patient will benefit from skilled therapeutic intervention in order to improve the following deficits and impairments:  Increased edema, Decreased activity tolerance, Decreased strength, Increased fascial restricitons, Pain, Impaired UE functional use, Increased muscle spasms, Improper body mechanics, Decreased range of motion, Impaired flexibility, Postural dysfunction  Visit Diagnosis: Cervicalgia  Other symptoms and signs involving the musculoskeletal system  Muscle weakness (generalized)     Problem List Patient Active Problem List   Diagnosis Date Noted  . Acute bronchitis 08/10/2014  . Unstable angina ([place]) 05/29/2014  . Abnormal nuclear stress test 05/29/2014  . Chest pain 05/06/2014  . Exertional shortness of breath 05/06/2014  . Mixed hyperlipidemia 05/06/2014  . Hyperlipidemia with target LDL less than 70 08/05/2013  . HTN (hypertension) 08/05/2013  . Chest wall pain 08/05/2013  . SINUSITIS, ACUTE 04/07/2009  . HYPOTHYROIDISM 12/10/2007  . DIABETES MELLITUS, TYPE II 05/28/2007  . Obstructive sleep apnea 05/28/2007  . Coronary  atherosclerosis 05/28/2007  . Seasonal and perennial allergic rhinitis 05/28/2007  . COUGH, CHRONIC 05/28/2007     Janene Harvey, PT, DPT 12/10/19 11:59 AM   [facility] [address]  [place], [name], [address] Phone: [phone]   Fax:  [phone]  [name] MRN: [id number] Date of Birth: 07/01/1956

## 2019-12-11 ENCOUNTER — Ambulatory Visit (HOSPITAL_BASED_OUTPATIENT_CLINIC_OR_DEPARTMENT_OTHER)
Admission: RE | Admit: 2019-12-11 | Discharge: 2019-12-11 | Disposition: A | Discharge status: Home / Self Care | Payer: 59 | Source: Ambulatory Visit | Attending: Endocrinology | Admitting: Endocrinology

## 2019-12-11 ENCOUNTER — Telehealth: Payer: Self-pay | Admitting: Cardiovascular Disease

## 2019-12-11 DIAGNOSIS — Z1231 Encounter for screening mammogram for malignant neoplasm of breast: Secondary | ICD-10-CM

## 2019-12-11 NOTE — Telephone Encounter (Signed)
   [facility] Pre-operative Risk Assessment    Request for surgical clearance:  1. What type of surgery is being performed? Total knee arthroplasty    2. When is this surgery scheduled? TBD   3. What type of clearance is required (medical clearance vs. Pharmacy clearance to hold med vs. Both)? Both   4. Are there any medications that need to be held prior to surgery and how long? Not specified but is on clopidogrel   5. Practice name and name of physician performing surgery? Susa Day, MD @ [facility]   6. What is your office phone number [phone]    7.   What is your office fax number [phone]  8.   Anesthesia type (None, local, MAC, general) ? General    Sheral Apley M 12/11/2019, 2:03 PM  _________________________________________________________________   (provider comments below)

## 2019-12-11 NOTE — Telephone Encounter (Signed)
   Primary Cardiologist: Shelva Majestic, MD  Chart reviewed as part of pre-operative protocol coverage. She has a hx of CAD with stenting of RCA ('07). Last cath 2015 with patent RCA stent, 50% mLAD with FFR of 0.94. Echo 9/20 with normal EF and mild LVH. Last seen in the office on 12/20 without anginal symptoms.   Dr. Claiborne Billings please comment regarding holding of plavix for knee arthroplasty and route response to P CV DIV PREOP.   Reino Bellis, NP 12/11/2019, 2:19 PM

## 2019-12-11 NOTE — Telephone Encounter (Signed)
Ok to hold

## 2019-12-12 NOTE — Telephone Encounter (Signed)
   Primary Cardiologist: Shelva Majestic, MD  Chart reviewed as part of pre-operative protocol coverage. Patient was contacted 12/12/2019 in reference to pre-operative risk assessment for pending surgery as outlined below.  [name] was last seen on 12/20 by Dr. Claiborne Billings.  Since that day, [name] has done well from a cardiac standpoint since that time. No anginal symptoms reported. Blood pressures have been well controlled.   Therefore, based on ACC/AHA guidelines, the patient would be at acceptable risk for the planned procedure without further cardiovascular testing.   Per Dr. Claiborne Billings- ok to hold plavix prior to procedure with general recommendation for 5-7 days prior and resume once appropriate from a surgical standpoint.  I will route this recommendation to the requesting party via Epic fax function and remove from pre-op pool.  Please call with questions.  Reino Bellis, NP 12/12/2019, 8:12 AM

## 2019-12-14 ENCOUNTER — Ambulatory Visit (HOSPITAL_BASED_OUTPATIENT_CLINIC_OR_DEPARTMENT_OTHER)
Admission: RE | Admit: 2019-12-14 | Discharge: 2019-12-14 | Disposition: A | Discharge status: Home / Self Care | Payer: 59 | Source: Ambulatory Visit | Attending: Specialist | Admitting: Specialist

## 2019-12-14 ENCOUNTER — Other Ambulatory Visit: Payer: Self-pay

## 2019-12-14 DIAGNOSIS — M542 Cervicalgia: Secondary | ICD-10-CM | POA: Diagnosis not present

## 2019-12-15 ENCOUNTER — Other Ambulatory Visit: Payer: Self-pay | Admitting: Cardiovascular Disease

## 2019-12-16 ENCOUNTER — Other Ambulatory Visit: Payer: Self-pay

## 2019-12-16 ENCOUNTER — Ambulatory Visit: Payer: 59

## 2019-12-16 DIAGNOSIS — M542 Cervicalgia: Secondary | ICD-10-CM

## 2019-12-16 DIAGNOSIS — M6281 Muscle weakness (generalized): Secondary | ICD-10-CM

## 2019-12-16 DIAGNOSIS — R29898 Other symptoms and signs involving the musculoskeletal system: Secondary | ICD-10-CM

## 2019-12-16 NOTE — Therapy (Addendum)
[facility] [address]  [place], [name], [address] Phone: [phone]   Fax:  [phone]  Physical Therapy Treatment  Patient Details  [name] MRN: [id number] Date of Birth: 16-Jun-1956 Referring Provider (PT): Susa Day, MD   Encounter Date: 12/16/2019  PT End of Session - 12/16/19 1327    Visit Number  5    Number of Visits  13    Date for PT Re-Evaluation  01/07/20    Authorization Type  [employer]HC & Tricare    PT Start Time  1316    PT Stop Time  1420    PT Time Calculation (min)  64 min    Activity Tolerance  Patient tolerated treatment well;Patient limited by pain    Behavior During Therapy  [facility] for tasks assessed/performed       Past Medical History:  Diagnosis Date  . Acute bronchitis   . Anemia    hx of anemia  . Anginal pain ([place])   . Arthritis    osteo  . Coronary atherosclerosis   . Cough   . Hypertension   . Obstructive sleep apnea (adult) (pediatric)   . Type II or unspecified type diabetes mellitus without mention of complication, not stated as uncontrolled    type 2  . Unspecified hypothyroidism     Past Surgical History:  Procedure Laterality Date  . ANTERIOR FUSION CERVICAL SPINE    . LEFT HEART CATHETERIZATION WITH CORONARY ANGIOGRAM N/A 05/29/2014   Procedure: LEFT HEART CATHETERIZATION WITH CORONARY ANGIOGRAM;  Surgeon: Sinclair Grooms, MD;  Location: [facility] CATH LAB;  Service: Cardiovascular;  Laterality: N/A;  . TUBAL LIGATION    . WISDOM TOOTH EXTRACTION      There were no vitals filed for this visit.  Subjective Assessment - 12/16/19 1321    Subjective  Pt. noting she had an MRI on Saturday.  Has a f/u with neurologist next week.    Pertinent History  DM II, HTN, angina, anemia, anterior cervical fusion, L heart cath & angio 2015    Diagnostic tests  none recent    Patient Stated Goals  "be able to use the R hand and get my ROM back"    Currently in Pain?   Yes    Pain Score  5     Pain Location  Shoulder    Pain Orientation  Right;Anterior;Lower    Pain Descriptors / Indicators  Aching    Pain Type  Acute pain;Chronic pain    Pain Radiating Towards  numbness into R elbow and into R hand    Multiple Pain Sites  No                       [facility] Adult PT Treatment/Exercise - 12/16/19 0001      Neck Exercises: Machines for Strengthening   UBE (Upper Arm Bike)  L2.0 x 27min forward/3 min back      Neck Exercises: Theraband   Shoulder Extension  10 reps    Shoulder Extension Limitations  yellow TB     Rows  10 reps    Rows Limitations  yellow TB     Horizontal ABduction  10 reps;Red    Horizontal ABduction Limitations  Hooklying on mat table with red TB      Neck Exercises: Supine   Neck Retraction  10 reps;5 secs    Neck Retraction Limitations  in cross pec stretch position  on table       Shoulder Exercises: Stretch   Corner Stretch  2 reps;30 seconds    Corner Stretch Limitations  R single arm low and mid position       Modalities   Modalities  Electrical Stimulation;Moist Heat      Moist Heat Therapy   Number Minutes Moist Heat  10 Minutes    Moist Heat Location  Shoulder   B upper shoulders in sitting      Electrical Stimulation   Electrical Stimulation Location  R UT    Electrical Stimulation Action  IFC    Electrical Stimulation Parameters  80-150Hz , intensity to pt. tolerance, 10'    Electrical Stimulation Goals  Pain;Tone      Manual Therapy   Manual Therapy  Soft tissue mobilization;Myofascial release    Manual therapy comments  sitting    Soft tissue mobilization  DTM to B UT (R>L), R LS, B cervical paraspinals     Myofascial Release  Manual TPR to R UT, LS, R pec     Passive ROM  Manual R pec stretch in varying positions       Neck Exercises: Stretches   Upper Trapezius Stretch  Right;1 rep;30 seconds    Upper Trapezius Stretch Limitations  hand anchored on chair              PT  Education - 12/16/19 1432    Education Details  HEP update;  R single arm only mid and low pec stretch    Person(s) Educated  Patient    Methods  Explanation;Demonstration;Verbal cues;Handout    Comprehension  Verbalized understanding;Returned demonstration;Verbal cues required       PT Short Term Goals - 12/10/19 1157      PT SHORT TERM GOAL #1   Title  Patient to be independent with initial HEP.    Time  3    Period  Weeks    Status  Achieved    Target Date  12/17/19        PT Long Term Goals - 12/03/19 1146      PT LONG TERM GOAL #1   Title  Patient to be independent with advanced HEP.    Time  6    Period  Weeks    Status  On-going      PT LONG TERM GOAL #2   Title  Patient to demonstrate cervical AROM WFL and without pain limiting.    Time  6    Period  Weeks    Status  On-going      PT LONG TERM GOAL #3   Title  Patient to demonstrate R UE strength >/=4+/5 and symmetricap grip strength.    Time  6    Period  Weeks    Status  On-going      PT LONG TERM GOAL #4   Title  Patient to report 90% improvement in driving tolerance in order to return to work pain-free.    Time  6    Period  Weeks    Status  On-going      PT LONG TERM GOAL #5   Title  Patient to lift 20lb box from floor to counter with good body mechanics and no pain.    Time  6    Period  Weeks    Status  On-going            Plan - 12/16/19 1328    Clinical Impression Statement  [name] with  complaint of R anterior shoulder pain.  MT revealing increased tension/tightness in R pec R UT, LS and cervical paraspinals.  TPR performed to R UT, LS, pec with good response.  Duration of session focused on Manual R pec stretches both with therapist and on door seal (see pt. education for HEP update).  Ended visit with some discomfort in R upper shoulder thus applied trial of E-stim/moist heat to this area for relaxation of musculature.    Comorbidities  DM II, HTN, angina, anemia, anterior cervical  fusion, L heart cath & angio 2015    Rehab Potential  Good    PT Frequency  2x / week    PT Treatment/Interventions  ADLs/Self Care Home Management;Cryotherapy;Electrical Stimulation;Moist Heat;Traction;Therapeutic exercise;Therapeutic activities;Functional mobility training;Ultrasound;Neuromuscular re-education;Patient/family education;Manual techniques;Taping;Energy conservation;Dry needling    PT Next Visit Plan  postural training, STM, no mechanical traction d/t hx cervical fusion    Consulted and Agree with Plan of Care  Patient       Patient will benefit from skilled therapeutic intervention in order to improve the following deficits and impairments:  Increased edema, Decreased activity tolerance, Decreased strength, Increased fascial restricitons, Pain, Impaired UE functional use, Increased muscle spasms, Improper body mechanics, Decreased range of motion, Impaired flexibility, Postural dysfunction  Visit Diagnosis: Cervicalgia  Other symptoms and signs involving the musculoskeletal system  Muscle weakness (generalized)     Problem List Patient Active Problem List   Diagnosis Date Noted  . Acute bronchitis 08/10/2014  . Unstable angina ([place]) 05/29/2014  . Abnormal nuclear stress test 05/29/2014  . Chest pain 05/06/2014  . Exertional shortness of breath 05/06/2014  . Mixed hyperlipidemia 05/06/2014  . Hyperlipidemia with target LDL less than 70 08/05/2013  . HTN (hypertension) 08/05/2013  . Chest wall pain 08/05/2013  . SINUSITIS, ACUTE 04/07/2009  . HYPOTHYROIDISM 12/10/2007  . DIABETES MELLITUS, TYPE II 05/28/2007  . Obstructive sleep apnea 05/28/2007  . Coronary atherosclerosis 05/28/2007  . Seasonal and perennial allergic rhinitis 05/28/2007  . COUGH, CHRONIC 05/28/2007   Bess Harvest, PTA 12/16/19 2:32 PM   [facility] [address]  [place], [name], [address] Phone: [phone]   Fax:   [phone]  [name] MRN: [id number] Date of Birth: 07-20-1956

## 2019-12-17 ENCOUNTER — Encounter: Payer: 59 | Admitting: Physical Therapy

## 2019-12-17 ENCOUNTER — Telehealth: Payer: Self-pay | Admitting: Critical Care Medicine

## 2019-12-17 NOTE — Telephone Encounter (Signed)
Nucala Order: 100mg  #1 Vial Order Date: 12/17/19 Expected date of arrival: 12/18/19 Ordered by: [place]: [name]

## 2019-12-18 ENCOUNTER — Other Ambulatory Visit: Payer: Self-pay

## 2019-12-18 ENCOUNTER — Ambulatory Visit: Payer: 59

## 2019-12-18 DIAGNOSIS — M6281 Muscle weakness (generalized): Secondary | ICD-10-CM

## 2019-12-18 DIAGNOSIS — M542 Cervicalgia: Secondary | ICD-10-CM | POA: Diagnosis not present

## 2019-12-18 DIAGNOSIS — R29898 Other symptoms and signs involving the musculoskeletal system: Secondary | ICD-10-CM

## 2019-12-18 NOTE — Telephone Encounter (Signed)
Nucala Shipment Received: 100mg  #1 vial Medication arrival date: 12/18/19 Lot #: [id number] Exp date: 06/07/2023 Received by: Elliot Dally

## 2019-12-18 NOTE — Therapy (Signed)
[facility] [address]  [place], [name], [address] Phone: [phone]   Fax:  [phone]  Physical Therapy Treatment  Patient Details  [name] MRN: [id number] Date of Birth: 1955-11-21 Referring Provider (PT): Susa Day, MD   Encounter Date: 12/18/2019  PT End of Session - 12/18/19 1326    Visit Number  6    Number of Visits  13    Date for PT Re-Evaluation  01/07/20    Authorization Type  [employer] & [employer]    PT Start Time  3244    PT Stop Time  1410    PT Time Calculation (min)  51 min    Activity Tolerance  Patient tolerated treatment well;Patient limited by pain    Behavior During Therapy  [facility] for tasks assessed/performed       Past Medical History:  Diagnosis Date  . Acute bronchitis   . Anemia    hx of anemia  . Anginal pain ([place])   . Arthritis    osteo  . Coronary atherosclerosis   . Cough   . Hypertension   . Obstructive sleep apnea (adult) (pediatric)   . Type II or unspecified type diabetes mellitus without mention of complication, not stated as uncontrolled    type 2  . Unspecified hypothyroidism     Past Surgical History:  Procedure Laterality Date  . ANTERIOR FUSION CERVICAL SPINE    . LEFT HEART CATHETERIZATION WITH CORONARY ANGIOGRAM N/A 05/29/2014   Procedure: LEFT HEART CATHETERIZATION WITH CORONARY ANGIOGRAM;  Surgeon: Sinclair Grooms, MD;  Location: [facility] CATH LAB;  Service: Cardiovascular;  Laterality: N/A;  . TUBAL LIGATION    . WISDOM TOOTH EXTRACTION      There were no vitals filed for this visit.  Subjective Assessment - 12/18/19 1323    Subjective  Pt. reporting, "I feel like new money" after last visit.    Pertinent History  DM II, HTN, angina, anemia, anterior cervical fusion, L heart cath & angio 2015    Diagnostic tests  none recent    Patient Stated Goals  "be able to use the R hand and get my ROM back"    Currently in Pain?  No/denies    Pain  Score  0-No pain    Pain Location  Shoulder    Pain Orientation  Right;Anterior;Lower    Pain Type  Acute pain;Chronic pain    Pain Radiating Towards  numbness into R elbow and 4th, and 5th fingers on R hand                        [facility] Adult PT Treatment/Exercise - 12/18/19 0001      Neck Exercises: Machines for Strengthening   UBE (Upper Arm Bike)  L2.0 x 63min forward/3 min back    Cybex Row  5# x 15 reps - low handles       Neck Exercises: Theraband   Shoulder Extension  10 reps    Shoulder Extension Limitations  yellow TB     Rows  --   x 12 reps   Rows Limitations  red TB    Shoulder External Rotation  10 reps    Shoulder External Rotation Limitations  yellow TB hooklying on table     Horizontal ABduction  Red;15 reps    Horizontal ABduction Limitations  Hooklying on mat table with red TB      Shoulder Exercises:  IT sales professional  2 reps;30 seconds    Corner Stretch Limitations  R single arm low and mid position       Moist Heat Therapy   Number Minutes Moist Heat  10 Minutes    Moist Heat Location  Shoulder   B Upper shoulders in stitting      Electrical Stimulation   Electrical Stimulation Location  R UT    Electrical Stimulation Action  IFC    Electrical Stimulation Parameters  80-150Hz , intensity to pt. tolerance, 10'    Electrical Stimulation Goals  Pain;Tone      Manual Therapy   Manual Therapy  Soft tissue mobilization;Myofascial release    Manual therapy comments  sitting    Soft tissue mobilization  DTM to R UT, LS    Myofascial Release  Manual TPR to R UT, LS       Neck Exercises: Stretches   Upper Trapezius Stretch  Right;1 rep;30 seconds    Upper Trapezius Stretch Limitations  hand anchored on chair     Levator Stretch  Right;1 rep;30 seconds    Levator Stretch Limitations  hand anchored on table              PT Education - 12/18/19 1521    Education Details  HEP update:  Hooklying horizontal abduction    Person(s)  Educated  Patient    Methods  Explanation;Demonstration;Verbal cues;Handout    Comprehension  Verbalized understanding;Returned demonstration;Verbal cues required       PT Short Term Goals - 12/10/19 1157      PT SHORT TERM GOAL #1   Title  Patient to be independent with initial HEP.    Time  3    Period  Weeks    Status  Achieved    Target Date  12/17/19        PT Long Term Goals - 12/03/19 1146      PT LONG TERM GOAL #1   Title  Patient to be independent with advanced HEP.    Time  6    Period  Weeks    Status  On-going      PT LONG TERM GOAL #2   Title  Patient to demonstrate cervical AROM WFL and without pain limiting.    Time  6    Period  Weeks    Status  On-going      PT LONG TERM GOAL #3   Title  Patient to demonstrate R UE strength >/=4+/5 and symmetricap grip strength.    Time  6    Period  Weeks    Status  On-going      PT LONG TERM GOAL #4   Title  Patient to report 90% improvement in driving tolerance in order to return to work pain-free.    Time  6    Period  Weeks    Status  On-going      PT LONG TERM GOAL #5   Title  Patient to lift 20lb box from floor to counter with good body mechanics and no pain.    Time  6    Period  Weeks    Status  On-going            Plan - 12/18/19 1327    Clinical Impression Statement  Aariyana reporting improved R hand numbness and pain since last session.  Session today focused on continued postural strengthening and MT to address R UT, LS tightness.  HEP updated with hooklying  horizontal abduction red TB as pt. tolerated this well last few visits.  Pt. with some tenderness/tone in R upper shoulder thus ended session with E-stim/moist heat to relax musculature and reduce pain.    Comorbidities  DM II, HTN, angina, anemia, anterior cervical fusion, L heart cath & angio 2015    Rehab Potential  Good    PT Frequency  2x / week    PT Treatment/Interventions  ADLs/Self Care Home Management;Cryotherapy;Electrical  Stimulation;Moist Heat;Traction;Therapeutic exercise;Therapeutic activities;Functional mobility training;Ultrasound;Neuromuscular re-education;Patient/family education;Manual techniques;Taping;Energy conservation;Dry needling    PT Next Visit Plan  postural training, STM, no mechanical traction d/t hx cervical fusion    Consulted and Agree with Plan of Care  Patient       Patient will benefit from skilled therapeutic intervention in order to improve the following deficits and impairments:  Increased edema, Decreased activity tolerance, Decreased strength, Increased fascial restricitons, Pain, Impaired UE functional use, Increased muscle spasms, Improper body mechanics, Decreased range of motion, Impaired flexibility, Postural dysfunction  Visit Diagnosis: Cervicalgia  Other symptoms and signs involving the musculoskeletal system  Muscle weakness (generalized)     Problem List Patient Active Problem List   Diagnosis Date Noted  . Acute bronchitis 08/10/2014  . Unstable angina ([place]) 05/29/2014  . Abnormal nuclear stress test 05/29/2014  . Chest pain 05/06/2014  . Exertional shortness of breath 05/06/2014  . Mixed hyperlipidemia 05/06/2014  . Hyperlipidemia with target LDL less than 70 08/05/2013  . HTN (hypertension) 08/05/2013  . Chest wall pain 08/05/2013  . SINUSITIS, ACUTE 04/07/2009  . HYPOTHYROIDISM 12/10/2007  . DIABETES MELLITUS, TYPE II 05/28/2007  . Obstructive sleep apnea 05/28/2007  . Coronary atherosclerosis 05/28/2007  . Seasonal and perennial allergic rhinitis 05/28/2007  . COUGH, CHRONIC 05/28/2007    Bess Harvest, PTA 12/18/19 3:26 PM   [facility] [address]  [place], [name], [address] Phone: [phone]   Fax:  [phone]  Name: PANTERA WINTERROWD MRN: [id number] Date of Birth: [birth year]

## 2019-12-23 ENCOUNTER — Ambulatory Visit: Payer: 59

## 2019-12-23 ENCOUNTER — Ambulatory Visit: Payer: 59 | Admitting: Physical Therapy

## 2019-12-24 ENCOUNTER — Encounter: Payer: 59 | Admitting: Physical Therapy

## 2019-12-25 ENCOUNTER — Ambulatory Visit: Payer: 59 | Admitting: Physical Therapy

## 2019-12-26 ENCOUNTER — Encounter: Payer: 59 | Admitting: Physical Therapy

## 2019-12-27 ENCOUNTER — Ambulatory Visit: Payer: Self-pay | Admitting: Orthopedic Surgery

## 2019-12-28 ENCOUNTER — Other Ambulatory Visit: Payer: 59

## 2019-12-30 ENCOUNTER — Ambulatory Visit: Payer: 59

## 2019-12-30 ENCOUNTER — Other Ambulatory Visit: Payer: Self-pay

## 2019-12-30 DIAGNOSIS — M542 Cervicalgia: Secondary | ICD-10-CM

## 2019-12-30 DIAGNOSIS — R29898 Other symptoms and signs involving the musculoskeletal system: Secondary | ICD-10-CM

## 2019-12-30 DIAGNOSIS — M6281 Muscle weakness (generalized): Secondary | ICD-10-CM

## 2019-12-30 NOTE — Therapy (Signed)
[facility] [address]  [place], [name], [address] Phone: [phone]   Fax:  [phone]  Physical Therapy Treatment  Patient Details  [name] MRN: [id number] Date of Birth: August 18, 1955 Referring Provider (PT): Susa Day, MD   Encounter Date: 12/30/2019  PT End of Session - 12/30/19 1126    Visit Number  7    Number of Visits  13    Date for PT Re-Evaluation  01/07/20    Authorization Type  [employer] & [employer]    PT Start Time  1105    PT Stop Time  1205    PT Time Calculation (min)  60 min    Activity Tolerance  Patient tolerated treatment well;Patient limited by pain    Behavior During Therapy  [facility] for tasks assessed/performed       Past Medical History:  Diagnosis Date  . Acute bronchitis   . Anemia    hx of anemia  . Anginal pain ([place])   . Arthritis    osteo  . Coronary atherosclerosis   . Cough   . Hypertension   . Obstructive sleep apnea (adult) (pediatric)   . Type II or unspecified type diabetes mellitus without mention of complication, not stated as uncontrolled    type 2  . Unspecified hypothyroidism     Past Surgical History:  Procedure Laterality Date  . ANTERIOR FUSION CERVICAL SPINE    . LEFT HEART CATHETERIZATION WITH CORONARY ANGIOGRAM N/A 05/29/2014   Procedure: LEFT HEART CATHETERIZATION WITH CORONARY ANGIOGRAM;  Surgeon: Sinclair Grooms, MD;  Location: [facility] CATH LAB;  Service: Cardiovascular;  Laterality: N/A;  . TUBAL LIGATION    . WISDOM TOOTH EXTRACTION      There were no vitals filed for this visit.  Subjective Assessment - 12/30/19 1112    Subjective  Pt. noting increased R hand numbness/tingling.    Pertinent History  DM II, HTN, angina, anemia, anterior cervical fusion, L heart cath & angio 2015    Diagnostic tests  none recent    Patient Stated Goals  "be able to use the R hand and get my ROM back"    Currently in Pain?  Yes    Pain Score  8     Pain Location  Shoulder    Pain Orientation  Right;Posterior    Pain Descriptors / Indicators  Aching    Pain Type  Acute pain;Chronic pain    Pain Radiating Towards  Numbness into R el bow and hand into all fingers                        [facility] Adult PT Treatment/Exercise - 12/30/19 0001      Neck Exercises: Machines for Strengthening   UBE (Upper Arm Bike)  L2.0 x 65mn forward/3 min back      Neck Exercises: Theraband   Shoulder External Rotation  10 reps    Shoulder External Rotation Limitations  seated in chair     Horizontal ABduction  10 reps    Horizontal ABduction Limitations  yellow TB leaning on pool noodle on wall       Shoulder Exercises: Stretch   Corner Stretch  2 reps;30 seconds    Corner Stretch Limitations  R single arm low and mid position       Moist Heat Therapy   Number Minutes Moist Heat  10 Minutes    Moist Heat Location  Shoulder   R upper trap     Electrical Stimulation   Electrical Stimulation Location  R UT    Electrical Stimulation Action  IFC    Electrical Stimulation Parameters  80-150Hz , intensity to pt. tolerance, 10'    Electrical Stimulation Goals  Pain;Tone      Manual Therapy   Manual Therapy  Soft tissue mobilization;Myofascial release    Manual therapy comments  sitting     Soft tissue mobilization  DTM to R posterior/inferior shoulder, UT, LS, cervical paraspinals     Myofascial Release  Manual TPR to R posterior/inferior shoulder              PT Education - 12/30/19 1213    Education Details  HEP update;  seated horizontal abduction with yellow TB, standing B shoulder ER with yellow TB    Person(s) Educated  Patient    Methods  Explanation;Demonstration;Verbal cues;Handout    Comprehension  Verbalized understanding;Returned demonstration;Verbal cues required       PT Short Term Goals - 12/10/19 1157      PT SHORT TERM GOAL #1   Title  Patient to be independent with initial HEP.    Time  3    Period   Weeks    Status  Achieved    Target Date  12/17/19        PT Long Term Goals - 12/30/19 1136      PT LONG TERM GOAL #1   Title  Patient to be independent with advanced HEP.    Time  6    Period  Weeks    Status  On-going      PT LONG TERM GOAL #2   Title  Patient to demonstrate cervical AROM WFL and without pain limiting.    Time  6    Period  Weeks    Status  On-going      PT LONG TERM GOAL #3   Title  Patient to demonstrate R UE strength >/=4+/5 and symmetricap grip strength.    Time  6    Period  Weeks    Status  On-going      PT LONG TERM GOAL #4   Title  Patient to report 90% improvement in driving tolerance in order to return to work pain-free.    Time  6    Period  Weeks    Status  Partially Met   12/30/19:  noting 80% impromement     PT LONG TERM GOAL #5   Title  Patient to lift 20lb box from floor to counter with good body mechanics and no pain.    Time  6    Period  Weeks    Status  On-going            Plan - 12/30/19 1128    Clinical Impression Statement  [name] reports she wasn't feeling well two weeks ago thus had to cancel therapy.  Feels that she spent much time at home laying on R shoulder on her couch and attributes increased R upper shoulder pain to this positioning.  MT addressing increased tension/TP's in R upper shoulder which responded well to MT.  Duration of session focused on postural strengthening anterior chest stretching and HEP update.  Ended session with E-stim/moist heat for improvement in muscular tension and pain with pt. noting good relief.  Pt. noting 80% improvement in driving tolerance since start of therapy.  LTG #4 partially achieved.  Pt. to see supervising PT at next  appointment.    Comorbidities  DM II, HTN, angina, anemia, anterior cervical fusion, L heart cath & angio 2015    Rehab Potential  Good    PT Frequency  2x / week    PT Treatment/Interventions  ADLs/Self Care Home Management;Cryotherapy;Electrical  Stimulation;Moist Heat;Traction;Therapeutic exercise;Therapeutic activities;Functional mobility training;Ultrasound;Neuromuscular re-education;Patient/family education;Manual techniques;Taping;Energy conservation;Dry needling    Consulted and Agree with Plan of Care  Patient       Patient will benefit from skilled therapeutic intervention in order to improve the following deficits and impairments:  Increased edema, Decreased activity tolerance, Decreased strength, Increased fascial restricitons, Pain, Impaired UE functional use, Increased muscle spasms, Improper body mechanics, Decreased range of motion, Impaired flexibility, Postural dysfunction  Visit Diagnosis: Cervicalgia  Other symptoms and signs involving the musculoskeletal system  Muscle weakness (generalized)     Problem List Patient Active Problem List   Diagnosis Date Noted  . Acute bronchitis 08/10/2014  . Unstable angina ([place]) 05/29/2014  . Abnormal nuclear stress test 05/29/2014  . Chest pain 05/06/2014  . Exertional shortness of breath 05/06/2014  . Mixed hyperlipidemia 05/06/2014  . Hyperlipidemia with target LDL less than 70 08/05/2013  . HTN (hypertension) 08/05/2013  . Chest wall pain 08/05/2013  . SINUSITIS, ACUTE 04/07/2009  . HYPOTHYROIDISM 12/10/2007  . DIABETES MELLITUS, TYPE II 05/28/2007  . Obstructive sleep apnea 05/28/2007  . Coronary atherosclerosis 05/28/2007  . Seasonal and perennial allergic rhinitis 05/28/2007  . COUGH, CHRONIC 05/28/2007    Bess Harvest, PTA 12/30/19 12:13 PM   [facility] [address]  [place], [name], [address] Phone: [phone]   Fax:  [phone]  [name] MRN: [id number] Date of Birth: 19-May-1956

## 2019-12-31 ENCOUNTER — Ambulatory Visit: Payer: 59

## 2020-01-01 ENCOUNTER — Ambulatory Visit: Payer: 59 | Admitting: Physical Therapy

## 2020-01-01 ENCOUNTER — Encounter (HOSPITAL_COMMUNITY): Payer: Self-pay

## 2020-01-01 NOTE — Patient Instructions (Addendum)
DUE TO COVID-19 ONLY ONE VISITOR ARE ALLOWED TO COME WITH YOU AND STAY IN THE WAITING ROOM ONLY DURING PRE OP AND PROCEDURE. THEN TWO VISITORS MAY VISIT WITH YOU IN YOUR PRIVATE ROOM DURING VISITING HOURS ONLY!!   COVID SWAB TESTING MUST BE COMPLETED ON: Monday, January 13, 2020 at [place] enter pre surgical testing line (Must self quarantine after testing. Follow instructions on handout.)             Your procedure is scheduled on: Wednesday, January 15, 2020   Report to [facility] Main  Entrance    Report to admitting at 6:30 AM   Call this number if you have problems the morning of surgery [phone]   Do not eat food :After Midnight.   May have liquids until 5:30 AM day of surgery   CLEAR LIQUID DIET  Foods Allowed                                                                     Foods Excluded  Water, Black Coffee and tea, regular and decaf                             liquids that you cannot  Plain Jell-O in any flavor  (No red)                                           see through such as: Fruit ices (not with fruit pulp)                                     milk, soups, orange juice  Iced Popsicles (No red)                                    All solid food Carbonated beverages, regular and diet                                    Apple juices Sports drinks like Gatorade (No red) Lightly seasoned clear broth or consume(fat free) Sugar, honey syrup  Sample Menu Breakfast                                Lunch                                     Supper Cranberry juice                    Beef broth                            Chicken broth Jell-O  Grape juice                           Apple juice Coffee or tea                        Jell-O                                      Popsicle                                                Coffee or tea                        Coffee or  tea   Complete one G2 drink the morning of surgery at 5:30 AM the day of surgery.   Oral Hygiene is also important to reduce your risk of infection.                                    Remember - BRUSH YOUR TEETH THE MORNING OF SURGERY WITH YOUR REGULAR TOOTHPASTE   Do NOT smoke after Midnight   Take these medicines the morning of surgery with A SIP OF WATER: Isosorbide, Levothyroxine, Metoprolol, Pregablin, Rosuvastatin   DO NOT TAKE VICTOZA the night before surgery   The night before surgery on take 20 units of Lantus at bedtime   Use Inhaler the morning of surgery   May use Flonase if needed the morning of surgery   Bring Inhalers with you day of surgery   DO NOT TAKE ANY ORAL DIABETIC MEDICATIONS DAY OF YOUR SURGERY                               You may not have any metal on your body including hair pins, jewelry, and body piercings             Do not wear make-up, lotions, powders, perfumes/cologne, or deodorant             Do not wear nail polish.  Do not shave  48 hours prior to surgery.             Do not bring valuables to the hospital. [place].   Contacts, dentures or bridgework may not be worn into surgery.   Bring small overnight bag day of surgery.    Patients discharged the day of surgery will not be allowed to drive home.   Special Instructions: Bring a copy of your healthcare power of attorney and living will documents         the day of surgery if you haven't scanned them in before.              Please read over the following fact sheets you were given: IF [place] [phone]  How to Manage Your Diabetes Before and After Surgery  Why is it important to control  my blood sugar before and after surgery? . Improving blood sugar levels before and after surgery helps healing and can limit problems. . A way of improving blood sugar control is eating a healthy  diet by: o  Eating less sugar and carbohydrates o  Increasing activity/exercise o  Talking with your doctor about reaching your blood sugar goals . High blood sugars (greater than 180 mg/dL) can raise your risk of infections and slow your recovery, so you will need to focus on controlling your diabetes during the weeks before surgery. . Make sure that the doctor who takes care of your diabetes knows about your planned surgery including the date and location.  How do I manage my blood sugar before surgery? . Check your blood sugar at least 4 times a day, starting 2 days before surgery, to make sure that the level is not too high or low. o Check your blood sugar the morning of your surgery when you wake up and every 2 hours until you get to the Short Stay unit. . If your blood sugar is less than 70 mg/dL, you will need to treat for low blood sugar: o Do not take insulin. o Treat a low blood sugar (less than 70 mg/dL) with  cup of clear juice (cranberry or apple), 4 glucose tablets, OR glucose gel. o Recheck blood sugar in 15 minutes after treatment (to make sure it is greater than 70 mg/dL). If your blood sugar is not greater than 70 mg/dL on recheck, call [phone] for further instructions. . Report your blood sugar to the short stay nurse when you get to Short Stay.  . If you are admitted to the hospital after surgery: o Your blood sugar will be checked by the staff and you will probably be given insulin after surgery (instead of oral diabetes medicines) to make sure you have good blood sugar levels. o The goal for blood sugar control after surgery is 80-180 mg/dL.   WHAT DO I DO ABOUT MY DIABETES MEDICATION?  Marland Kitchen Do not take oral diabetes medicines (pills) the morning of surgery.  . DO NOT TAKE VICTOZA THE NIGHT BEFORE SURGERY  . THE NIGHT BEFORE SURGERY, take 20    units of    Lantus   insulin.      . The day of surgery, do not take other diabetes injectables, including Byetta  (exenatide), Bydureon (exenatide ER), Victoza (liraglutide), or Trulicity (dulaglutide).  . If your CBG is greater than 220 mg/dL, you may take  of your sliding scale  . (correction) dose of insulin.    Reviewed and Endorsed by [facility] Patient Education Committee, August 2015   [facility] - Preparing for Surgery Before surgery, you can play an important role.  Because skin is not sterile, your skin needs to be as free of germs as possible.  You can reduce the number of germs on your skin by washing with CHG (chlorahexidine gluconate) soap before surgery.  CHG is an antiseptic cleaner which kills germs and bonds with the skin to continue killing germs even after washing. Please DO NOT use if you have an allergy to CHG or antibacterial soaps.  If your skin becomes reddened/irritated stop using the CHG and inform your nurse when you arrive at Short Stay. Do not shave (including legs and underarms) for at least 48 hours prior to the first CHG shower.  You may shave your face/neck.  Please follow these instructions carefully:  1.  Shower with CHG Soap the  night before surgery and the  morning of surgery.  2.  If you choose to wash your hair, wash your hair first as usual with your normal  shampoo.  3.  After you shampoo, rinse your hair and body thoroughly to remove the shampoo.                             4.  Use CHG as you would any other liquid soap.  You can apply chg directly to the skin and wash.  Gently with a scrungie or clean washcloth.  5.  Apply the CHG Soap to your body ONLY FROM THE NECK DOWN.   Do   not use on face/ open                           Wound or open sores. Avoid contact with eyes, ears mouth and   genitals (private parts).                       Wash face,  Genitals (private parts) with your normal soap.             6.  Wash thoroughly, paying special attention to the area where your    surgery  will be performed.  7.  Thoroughly rinse your body with warm water from the  neck down.  8.  DO NOT shower/wash with your normal soap after using and rinsing off the CHG Soap.                9.  Pat yourself dry with a clean towel.            10.  Wear clean pajamas.            11.  Place clean sheets on your bed the night of your first shower and do not  sleep with pets. Day of Surgery : Do not apply any lotions/deodorants the morning of surgery.  Please wear clean clothes to the hospital/surgery center.  FAILURE TO FOLLOW THESE INSTRUCTIONS MAY RESULT IN THE CANCELLATION OF YOUR SURGERY  PATIENT SIGNATURE_________________________________  NURSE SIGNATURE__________________________________  ________________________________________________________________________   Adam Phenix  An incentive spirometer is a tool that can help keep your lungs clear and active. This tool measures how well you are filling your lungs with each breath. Taking long deep breaths may help reverse or decrease the chance of developing breathing (pulmonary) problems (especially infection) following:  A long period of time when you are unable to move or be active. BEFORE THE PROCEDURE   If the spirometer includes an indicator to show your best effort, your nurse or respiratory therapist will set it to a desired goal.  If possible, sit up straight or lean slightly forward. Try not to slouch.  Hold the incentive spirometer in an upright position. INSTRUCTIONS FOR USE  1. Sit on the edge of your bed if possible, or sit up as far as you can in bed or on a chair. 2. Hold the incentive spirometer in an upright position. 3. Breathe out normally. 4. Place the mouthpiece in your mouth and seal your lips tightly around it. 5. Breathe in slowly and as deeply as possible, raising the piston or the ball toward the top of the column. 6. Hold your breath for 3-5 seconds or for as long as possible. Allow the piston or ball to fall to the  bottom of the column. 7. Remove the mouthpiece from your  mouth and breathe out normally. 8. Rest for a few seconds and repeat Steps 1 through 7 at least 10 times every 1-2 hours when you are awake. Take your time and take a few normal breaths between deep breaths. 9. The spirometer may include an indicator to show your best effort. Use the indicator as a goal to work toward during each repetition. 10. After each set of 10 deep breaths, practice coughing to be sure your lungs are clear. If you have an incision (the cut made at the time of surgery), support your incision when coughing by placing a pillow or rolled up towels firmly against it. Once you are able to get out of bed, walk around indoors and cough well. You may stop using the incentive spirometer when instructed by your caregiver.  RISKS AND COMPLICATIONS  Take your time so you do not get dizzy or light-headed.  If you are in pain, you may need to take or ask for pain medication before doing incentive spirometry. It is harder to take a deep breath if you are having pain. AFTER USE  Rest and breathe slowly and easily.  It can be helpful to keep track of a log of your progress. Your caregiver can provide you with a simple table to help with this. If you are using the spirometer at home, follow these instructions: [place] IF:   You are having difficultly using the spirometer.  You have trouble using the spirometer as often as instructed.  Your pain medication is not giving enough relief while using the spirometer.  You develop fever of 100.5 F (38.1 C) or higher. SEEK IMMEDIATE MEDICAL CARE IF:   You cough up bloody sputum that had not been present before.  You develop fever of 102 F (38.9 C) or greater.  You develop worsening pain at or near the incision site. MAKE SURE YOU:   Understand these instructions.  Will watch your condition.  Will get help right away if you are not doing well or get worse. Document Released: 12/05/2006 Document Revised: 10/17/2011  Document Reviewed: 02/05/2007 [facility] Patient Information 2014 [place], [place].   ________________________________________________________________________

## 2020-01-02 ENCOUNTER — Encounter (HOSPITAL_COMMUNITY)
Admission: RE | Admit: 2020-01-02 | Discharge: 2020-01-02 | Disposition: A | Discharge status: Home / Self Care | Payer: 59 | Source: Ambulatory Visit | Attending: Specialist | Admitting: Specialist

## 2020-01-02 ENCOUNTER — Encounter: Payer: 59 | Admitting: Physical Therapy

## 2020-01-02 ENCOUNTER — Other Ambulatory Visit: Payer: Self-pay

## 2020-01-02 ENCOUNTER — Encounter (HOSPITAL_COMMUNITY): Payer: Self-pay

## 2020-01-02 HISTORY — DX: Personal history of other diseases of the nervous system and sense organs: Z86.69

## 2020-01-02 HISTORY — DX: Gastro-esophageal reflux disease without esophagitis: K21.9

## 2020-01-02 HISTORY — DX: Personal history of colon polyps, unspecified: Z86.0100

## 2020-01-02 HISTORY — DX: Personal history of colonic polyps: Z86.010

## 2020-01-02 HISTORY — DX: Hyperlipidemia, unspecified: E78.5

## 2020-01-02 HISTORY — DX: Polyneuropathy, unspecified: G62.9

## 2020-01-02 HISTORY — DX: Unspecified asthma, uncomplicated: J45.909

## 2020-01-02 NOTE — Progress Notes (Signed)
COVID Vaccine Completed: Yes Date COVID Vaccine completed: 11/2019 COVID vaccine manufacturer: Pfizer      PCP - Dr. Baldwin Crown Cardiologist - Dr. Corky Downs clearance 12/11/19 telephone encounter in epic  Chest x-ray - greater than 1 year EKG - 07/10/19 in epic Stress Test - greater than 2 years ECHO - 04/23/19 in epic Cardiac Cath - greater than 2 years  Sleep Study - 03/07/2003 in epic CPAP - Yes  Fasting Blood Sugar - 85-130 Checks Blood Sugar ___2-3__ times a day  Blood Thinner Instructions: N/A Aspirin Instructions: Yes Last Dose: Will stop 7 days prior to surgery  Anesthesia review: OSA, CAD, HTN, DM  Patient denies shortness of breath, fever, cough and chest pain at PAT appointment   Patient verbalized understanding of instructions that were given to them at the PAT appointment. Patient was also instructed that they will need to review over the PAT instructions again at home before surgery.

## 2020-01-07 ENCOUNTER — Encounter (HOSPITAL_COMMUNITY)
Admission: RE | Admit: 2020-01-07 | Discharge: 2020-01-07 | Disposition: A | Discharge status: Home / Self Care | Payer: 59 | Source: Ambulatory Visit | Attending: Specialist | Admitting: Specialist

## 2020-01-07 ENCOUNTER — Other Ambulatory Visit: Payer: Self-pay

## 2020-01-07 ENCOUNTER — Encounter: Payer: 59 | Admitting: Physical Therapy

## 2020-01-07 DIAGNOSIS — Z01812 Encounter for preprocedural laboratory examination: Secondary | ICD-10-CM | POA: Diagnosis present

## 2020-01-07 LAB — SURGICAL PCR SCREEN
MRSA, PCR: NEGATIVE
Staphylococcus aureus: NEGATIVE

## 2020-01-07 LAB — URINALYSIS, ROUTINE W REFLEX MICROSCOPIC
Bacteria, UA: NONE SEEN
Bilirubin Urine: NEGATIVE
Glucose, UA: NEGATIVE mg/dL
Hgb urine dipstick: NEGATIVE
Ketones, ur: NEGATIVE mg/dL
Leukocytes,Ua: NEGATIVE
Nitrite: NEGATIVE
Protein, ur: NEGATIVE mg/dL
Specific Gravity, Urine: 1.013 (ref 1.005–1.030)
pH: 5 (ref 5.0–8.0)

## 2020-01-07 LAB — BASIC METABOLIC PANEL
Anion gap: 11 (ref 5–15)
BUN: 27 mg/dL — ABNORMAL HIGH (ref 8–23)
CO2: 24 mmol/L (ref 22–32)
Calcium: 8.4 mg/dL — ABNORMAL LOW (ref 8.9–10.3)
Chloride: 106 mmol/L (ref 98–111)
Creatinine, Ser: 1.44 mg/dL — ABNORMAL HIGH (ref 0.44–1.00)
GFR calc Af Amer: 44 mL/min — ABNORMAL LOW (ref >60)
GFR calc non Af Amer: 38 mL/min — ABNORMAL LOW (ref >60)
Glucose, Bld: 148 mg/dL — ABNORMAL HIGH (ref 70–99)
Potassium: 3.9 mmol/L (ref 3.5–5.1)
Sodium: 141 mmol/L (ref 135–145)

## 2020-01-07 LAB — CBC
HCT: 35 % — ABNORMAL LOW (ref 36.0–46.0)
Hemoglobin: 10.4 g/dL — ABNORMAL LOW (ref 12.0–15.0)
MCH: 23.7 pg — ABNORMAL LOW (ref 26.0–34.0)
MCHC: 29.7 g/dL — ABNORMAL LOW (ref 30.0–36.0)
MCV: 79.7 fL — ABNORMAL LOW (ref 80.0–100.0)
Platelets: 290 10*3/uL (ref 150–400)
RBC: 4.39 MIL/uL (ref 3.87–5.11)
RDW: 15.9 % — ABNORMAL HIGH (ref 11.5–15.5)
WBC: 7.1 10*3/uL (ref 4.0–10.5)
nRBC: 0 % (ref 0.0–0.2)

## 2020-01-07 LAB — PROTIME-INR
INR: 1 (ref 0.8–1.2)
Prothrombin Time: 13.1 seconds (ref 11.4–15.2)

## 2020-01-07 LAB — HEMOGLOBIN A1C
Hgb A1c MFr Bld: 7.3 % — ABNORMAL HIGH (ref 4.8–5.6)
Mean Plasma Glucose: 162.81 mg/dL

## 2020-01-07 LAB — GLUCOSE, CAPILLARY: Glucose-Capillary: 147 mg/dL — ABNORMAL HIGH (ref 70–99)

## 2020-01-07 LAB — APTT: aPTT: 28 seconds (ref 24–36)

## 2020-01-08 ENCOUNTER — Encounter: Payer: Self-pay | Admitting: Physical Therapy

## 2020-01-08 ENCOUNTER — Ambulatory Visit: Payer: 59 | Attending: Specialist | Admitting: Physical Therapy

## 2020-01-08 DIAGNOSIS — R29898 Other symptoms and signs involving the musculoskeletal system: Secondary | ICD-10-CM

## 2020-01-08 DIAGNOSIS — M542 Cervicalgia: Secondary | ICD-10-CM | POA: Diagnosis present

## 2020-01-08 DIAGNOSIS — M6281 Muscle weakness (generalized): Secondary | ICD-10-CM

## 2020-01-08 NOTE — Therapy (Signed)
[facility] [address]  [place], [name], [address] Phone: [phone]   Fax:  [phone]  Physical Therapy Treatment  Patient Details  [name] MRN: [id number] Date of Birth: 1955-11-22 Referring Provider (PT): Susa Day, MD   Encounter Date: 01/08/2020  PT End of Session - 01/08/20 1703    Visit Number  8    Number of Visits  20    Date for PT Re-Evaluation  02/19/20    Authorization Type  [employer]    PT Start Time  1618    PT Stop Time  1706    PT Time Calculation (min)  48 min    Activity Tolerance  Patient tolerated treatment well;Patient limited by pain    Behavior During Therapy  [facility] for tasks assessed/performed       Past Medical History:  Diagnosis Date  . Acute bronchitis   . Anemia    hx of anemia  . Anginal pain ([place])   . Arthritis    osteo  . Asthma   . Coronary atherosclerosis   . Cough   . GERD (gastroesophageal reflux disease)   . History of colon polyps   . History of migraine   . Hyperlipidemia   . Hypertension   . Obstructive sleep apnea (adult) (pediatric)    NO CPAP   . Peripheral neuropathy   . Peripheral neuropathy   . Type II or unspecified type diabetes mellitus without mention of complication, not stated as uncontrolled    type 2  . Unspecified hypothyroidism     Past Surgical History:  Procedure Laterality Date  . ANTERIOR FUSION CERVICAL SPINE    . CARPAL TUNNEL RELEASE Left   . COLONOSCOPY    . CORONARY ANGIOPLASTY    . LEFT HEART CATHETERIZATION WITH CORONARY ANGIOGRAM N/A 05/29/2014   Procedure: LEFT HEART CATHETERIZATION WITH CORONARY ANGIOGRAM;  Surgeon: Sinclair Grooms, MD;  Location: [facility] CATH LAB;  Service: Cardiovascular;  Laterality: N/A;  . TUBAL LIGATION    . WISDOM TOOTH EXTRACTION      There were no vitals filed for this visit.  Subjective Assessment - 01/08/20 1620    Subjective  Has been having to take Tramadol d/t  increased neck pain- unsure of cause. Got to the point where she had trouble using her hand. Preparing for her knee surgery on the 9th.    Pertinent History  DM II, HTN, angina, anemia, anterior cervical fusion, L heart cath & angio 2015    Diagnostic tests  none recent    Patient Stated Goals  "be able to use the R hand and get my ROM back"    Currently in Pain?  Yes    Pain Score  8     Pain Location  Neck    Pain Orientation  Right    Pain Descriptors / Indicators  Aching    Pain Radiating Towards  down the R arm and into fingers         [facility] PT Assessment - 01/08/20 0001      Assessment   Medical Diagnosis  Cervicalgia    Referring Provider (PT)  Susa Day, MD    Onset Date/Surgical Date  11/05/19      AROM   Cervical Flexion  29    Cervical Extension  31    Cervical - Right Side Bend  29    Cervical - Left Side Bend  36  Cervical - Right Rotation  45    Cervical - Left Rotation  45      Strength   Right Shoulder Flexion  4+/5    Right Shoulder ABduction  4/5    Right Shoulder Internal Rotation  4/5    Right Shoulder External Rotation  4/5    Left Shoulder Flexion  4+/5    Left Shoulder ABduction  4+/5    Left Shoulder Internal Rotation  4+/5    Left Shoulder External Rotation  4+/5    Right Elbow Flexion  4+/5    Right Elbow Extension  4/5    Left Elbow Flexion  4+/5    Left Elbow Extension  4+/5    Right Hand Grip (lbs)  15   15, 15, 15   Left Hand Grip (lbs)  20.33   21, 20, 20                   [facility] Adult PT Treatment/Exercise - 01/08/20 0001      Exercises   Exercises  Neck;Shoulder      Shoulder Exercises: Pulleys   Scaption  3 minutes    Scaption Limitations  to tolerance       Moist Heat Therapy   Number Minutes Moist Heat  10 Minutes    Moist Heat Location  Cervical      Electrical Stimulation   Electrical Stimulation Location  R UT    Electrical Stimulation Action  IFC    Electrical Stimulation Parameters  80-150hz ;  intensity to tolerance; 10 min    Electrical Stimulation Goals  Pain;Tone             PT Education - 01/08/20 1702    Education Details  update/consolidation of HEP, discussion on objective progess and remaining impairments    Person(s) Educated  Patient    Methods  Explanation;Demonstration;Handout;Verbal cues    Comprehension  Verbalized understanding       PT Short Term Goals - 01/08/20 1625      PT SHORT TERM GOAL #1   Title  Patient to be independent with initial HEP.    Time  3    Period  Weeks    Status  Achieved    Target Date  12/17/19        PT Long Term Goals - 01/08/20 1625      PT LONG TERM GOAL #1   Title  Patient to be independent with advanced HEP.    Time  6    Period  Weeks    Status  Achieved    Target Date  02/19/20      PT LONG TERM GOAL #2   Title  Patient to demonstrate cervical AROM WFL and without pain limiting.    Time  6    Period  Weeks    Status  Achieved    Target Date  02/19/20      PT LONG TERM GOAL #3   Title  Patient to demonstrate R UE strength >/=4+/5 and symmetricap grip strength.    Time  6    Period  Weeks    Status  Partially Met   improved in R shoulder flexion L shoulder ER, and R elbow flexion   Target Date  02/19/20      PT LONG TERM GOAL #4   Title  Patient to report 90% improvement in driving tolerance in order to return to work pain-free.    Time  6  Period  Weeks    Status  Partially Met   01/08/20 noting 60-65% impromement   Target Date  02/19/20      PT LONG TERM GOAL #5   Title  Patient to lift 20lb box from floor to counter with good body mechanics and no pain.    Time  6    Period  Weeks    Status  On-going   reporting that she is unable to lift >5 lbs d/t pain   Target Date  02/19/20            Plan - 01/08/20 1705    Clinical Impression Statement  Patient reporting increased neck pain recently without known cause. Scheduled for TKA on 01/15/20. Patient is agreeable to wrapping up  with PT until she returns s/p surgery, in order to continue working on neck and knee pain. Strength testing today revealed improvement in R shoulder flexion, L shoulder ER, and R elbow flexion. Cervical AROM has improved in all planes and without pain limiting. 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 Worked on consolidating patient's HEP for max benefit. Patient reported understanding. Ended session with e-stim and moist heat to R UT for pain relief- no complaints at end of session. Patient has demonstrating good progress towards goals. Would benefit from continued skilled PT services 2x/week for 6 weeks to address remaining goals.    Comorbidities  DM II, HTN, angina, anemia, anterior cervical fusion, L heart cath & angio 2015    Rehab Potential  Good    PT Frequency  2x / week    PT Duration  6 weeks    PT Treatment/Interventions  ADLs/Self Care Home Management;Cryotherapy;Electrical Stimulation;Moist Heat;Traction;Therapeutic exercise;Therapeutic activities;Functional mobility training;Ultrasound;Neuromuscular re-education;Patient/family education;Manual techniques;Taping;Energy conservation;Dry needling    PT Next Visit Plan  postural training, STM, no mechanical traction d/t hx cervical fusion    Consulted and Agree with Plan of Care  Patient       Patient will benefit from skilled therapeutic intervention in order to improve the following deficits and impairments:  Increased edema, Decreased activity tolerance, Decreased strength, Increased fascial restricitons, Pain, Impaired UE functional use, Increased muscle spasms, Improper body mechanics, Decreased range of motion, Impaired flexibility, Postural dysfunction  Visit Diagnosis: Cervicalgia  Other symptoms and signs involving the musculoskeletal system  Muscle weakness (generalized)     Problem  List Patient Active Problem List   Diagnosis Date Noted  . Acute bronchitis 08/10/2014  . Unstable angina ([place]) 05/29/2014  . Abnormal nuclear stress test 05/29/2014  . Chest pain 05/06/2014  . Exertional shortness of breath 05/06/2014  . Mixed hyperlipidemia 05/06/2014  . Hyperlipidemia with target LDL less than 70 08/05/2013  . HTN (hypertension) 08/05/2013  . Chest wall pain 08/05/2013  . SINUSITIS, ACUTE 04/07/2009  . HYPOTHYROIDISM 12/10/2007  . DIABETES MELLITUS, TYPE II 05/28/2007  . Obstructive sleep apnea 05/28/2007  . Coronary atherosclerosis 05/28/2007  . Seasonal and perennial allergic rhinitis 05/28/2007  . COUGH, CHRONIC 05/28/2007     Janene Harvey, PT, DPT 01/08/20 5:13 PM   [facility] [address]  [place], [name], [address] Phone: [phone]   Fax:  [phone]  [name] MRN: [id number] Date of Birth: 12/15/1955

## 2020-01-09 ENCOUNTER — Ambulatory Visit: Payer: Self-pay | Admitting: Orthopedic Surgery

## 2020-01-09 ENCOUNTER — Other Ambulatory Visit: Payer: Self-pay | Admitting: Orthopedic Surgery

## 2020-01-09 NOTE — H&P (View-Only) (Signed)
[name] is an 64 y.o. female.   Chief Complaint: L knee pain HPI: [name] is here today for her History & Physical. She is scheduled for a left total knee arthroplasty on January 15, 2020 at [facility] by Dr. Susa Day.  Dr. Tonita Cong and the patient mutually agreed to proceed with a left total knee replacement. Risks and benefits of the procedure were discussed including stiffness, suboptimal range of motion, persistent pain, infection requiring removal of prosthesis and reinsertion, need for prophylactic antibiotics in the future, for example, dental procedures, possible need for manipulation, revision in the future and also anesthetic complications including DVT, PE, etc. We discussed the perioperative course, time in the hospital, postoperative recovery and the need for elevation to control swelling. We also discussed the predicted range of motion and the probability that squatting and kneeling would be unobtainable in the future. In addition, postoperative anticoagulation was discussed. We have obtained preoperative medical clearance as necessary. Provided illustrated handout and discussed it in detail. They will enroll in the total joint replacement educational forum at the hospital.  Past Medical History:  Diagnosis Date  . Acute bronchitis   . Anemia    hx of anemia  . Anginal pain ([place])   . Arthritis    osteo  . Asthma   . Coronary atherosclerosis   . Cough   . GERD (gastroesophageal reflux disease)   . History of colon polyps   . History of migraine   . Hyperlipidemia   . Hypertension   . Obstructive sleep apnea (adult) (pediatric)    NO CPAP   . Peripheral neuropathy   . Peripheral neuropathy   . Type II or unspecified type diabetes mellitus without mention of complication, not stated as uncontrolled    type 2  . Unspecified hypothyroidism     Past Surgical History:  Procedure Laterality Date  . ANTERIOR FUSION CERVICAL SPINE    . CARPAL TUNNEL RELEASE  Left   . COLONOSCOPY    . CORONARY ANGIOPLASTY    . LEFT HEART CATHETERIZATION WITH CORONARY ANGIOGRAM N/A 05/29/2014   Procedure: LEFT HEART CATHETERIZATION WITH CORONARY ANGIOGRAM;  Surgeon: Sinclair Grooms, MD;  Location: [facility] CATH LAB;  Service: Cardiovascular;  Laterality: N/A;  . TUBAL LIGATION    . WISDOM TOOTH EXTRACTION      Family History  Problem Relation Age of Onset  . Hypertension Mother   . Heart disease Mother   . Stroke Mother   . Prostate cancer Father   . Heart disease Brother   . Heart attack Sister    Social History:  reports that she quit smoking about 16 years ago. She has a 3.00 pack-year smoking history. She has never used smokeless tobacco. She reports that she does not drink alcohol or use drugs.  Allergies:  Allergies  Allergen Reactions  . Codeine Other (See Comments)    REACTION: hallucinations/"loopy"  . Levaquin [Levofloxacin] Nausea And Vomiting  . Sulfa Antibiotics Hives  . Sulfonamide Derivatives Hives   Medications: Dexilant 30 mg capsule, delayed release diclofenac 1 % topical gel diclofenac sodium 75 mg tablet,delayed release EPINEPHrine 0.3 mg/0.3 mL injection, auto-injector fluconazole 150 mg tablet fluticasone propionate 50 mcg/actuation nasal spray,suspension furosemide 40 mg tablet HumaLOG KwikPen U-200 Insulin 200 unit/mL (3 mL) subcutaneous ipratropium 0.5 mg-albuteroL 3 mg (2.5 mg base)/3 mL nebulization soln isosorbide mononitrate ER 60 mg tablet,extended release 24 hr Lantus Solostar U-100 Insulin 100 unit/mL (3 mL) subcutaneous pen levothyroxine 100 mcg  tablet Lyrica 100 mg capsule montelukast 10 mg tablet nystatin 100,000 unit/gram topical cream rosuvastatin 5 mg tablet Trelegy Ellipta 200 mcg-62.5 mcg-25 mcg powder for inhalation triamcinolone acetonide 0.1 % topical cream triamterene 37.5 mg-hydrochlorothiazide 25 mg tablet Ultram 50 mg tablet Ventolin HFA 90 mcg/actuation aerosol inhaler verapamiL ER (SR) 120 mg  tablet,extended release Victoza 3-Pak 0.6 mg/0.1 mL (18 mg/3 mL) subcutaneous pen injector   Review of Systems  Constitutional: Negative.   HENT: Negative.   Eyes: Negative.   Respiratory: Negative.   Cardiovascular: Negative.   Gastrointestinal: Negative.   Endocrine: Negative.   Genitourinary: Negative.   Musculoskeletal: Positive for arthralgias and joint swelling.  Neurological: Negative.     There were no vitals taken for this visit. Physical Exam  Constitutional: She is oriented to person, place, and time. She appears well-developed and well-nourished.  HENT:  Head: Normocephalic.  Eyes: Pupils are equal, round, and reactive to light.  Respiratory: Effort normal and breath sounds normal.  GI: Soft. Bowel sounds are normal.  Musculoskeletal:     Cervical back: Normal range of motion.     Comments: Patient is a 64 year old female.  Range of motion is -5-110. Slight varus laxity. No DVT neurovascularly intact.  Neurological: She is oriented to person, place, and time.  Skin: Skin is warm and dry.    X-rays demonstrate bone-on-bone arthrosis medial compartment of the left knee.  Assessment/Plan Impression: Left knee end-stage osteoarthritis  Plan: Pt with end-stage Left knee DJD, bone-on-bone, refractory to conservative tx, scheduled for Left total knee replacement by Dr. Tonita Cong on June night. We again discussed the procedure itself as well as risks, complications and alternatives, including but not limited to DVT, PE, infx, bleeding, failure of procedure, need for secondary procedure including manipulation, nerve injury, ongoing pain/symptoms, anesthesia risk, even stroke or death. Also discussed typical post-op protocols, activity restrictions, need for PT, flexion/extension exercises, time out of work. Discussed need for DVT ppx post-op per protocol. Discussed dental ppx and infx prevention. Also discussed limitations post-operatively such as kneeling and squatting. All  questions were answered. Patient desires to proceed with surgery as scheduled.  Will hold supplements, ASA and NSAIDs accordingly. Will remain NPO after midnight the night before surgery. Will present to [facility] for pre-op testing. Anticipate hospital stay to include at least 2 midnights given medical history and to ensure proper pain control. Plan ASA 325mg  BID for DVT ppx post-op. Plan Oxycodone, Robaxin, Colace, Miralax. Plan home with HHPT post-op with family members at home for assistance. Will follow up 10-14 days post-op for suture removal and xrays. She has asked for pain medicine in the interim and I have sent her tramadol at her request.  Plan L total knee replacement  Cecilie Kicks, PA-C for Dr. Tonita Cong 01/09/2020, 2:19 PM

## 2020-01-09 NOTE — H&P (Signed)
[name] is an 64 y.o. female.   Chief Complaint: L knee pain HPI: [name] is here today for her History & Physical. She is scheduled for a left total knee arthroplasty on January 15, 2020 at [facility] by Dr. Susa Day.  Dr. Tonita Cong and the patient mutually agreed to proceed with a left total knee replacement. Risks and benefits of the procedure were discussed including stiffness, suboptimal range of motion, persistent pain, infection requiring removal of prosthesis and reinsertion, need for prophylactic antibiotics in the future, for example, dental procedures, possible need for manipulation, revision in the future and also anesthetic complications including DVT, PE, etc. We discussed the perioperative course, time in the hospital, postoperative recovery and the need for elevation to control swelling. We also discussed the predicted range of motion and the probability that squatting and kneeling would be unobtainable in the future. In addition, postoperative anticoagulation was discussed. We have obtained preoperative medical clearance as necessary. Provided illustrated handout and discussed it in detail. They will enroll in the total joint replacement educational forum at the hospital.  Past Medical History:  Diagnosis Date  . Acute bronchitis   . Anemia    hx of anemia  . Anginal pain ([place])   . Arthritis    osteo  . Asthma   . Coronary atherosclerosis   . Cough   . GERD (gastroesophageal reflux disease)   . History of colon polyps   . History of migraine   . Hyperlipidemia   . Hypertension   . Obstructive sleep apnea (adult) (pediatric)    NO CPAP   . Peripheral neuropathy   . Peripheral neuropathy   . Type II or unspecified type diabetes mellitus without mention of complication, not stated as uncontrolled    type 2  . Unspecified hypothyroidism     Past Surgical History:  Procedure Laterality Date  . ANTERIOR FUSION CERVICAL SPINE    . CARPAL TUNNEL RELEASE  Left   . COLONOSCOPY    . CORONARY ANGIOPLASTY    . LEFT HEART CATHETERIZATION WITH CORONARY ANGIOGRAM N/A 05/29/2014   Procedure: LEFT HEART CATHETERIZATION WITH CORONARY ANGIOGRAM;  Surgeon: Sinclair Grooms, MD;  Location: [facility] CATH LAB;  Service: Cardiovascular;  Laterality: N/A;  . TUBAL LIGATION    . WISDOM TOOTH EXTRACTION      Family History  Problem Relation Age of Onset  . Hypertension Mother   . Heart disease Mother   . Stroke Mother   . Prostate cancer Father   . Heart disease Brother   . Heart attack Sister    Social History:  reports that she quit smoking about 16 years ago. She has a 3.00 pack-year smoking history. She has never used smokeless tobacco. She reports that she does not drink alcohol or use drugs.  Allergies:  Allergies  Allergen Reactions  . Codeine Other (See Comments)    REACTION: hallucinations/"loopy"  . Levaquin [Levofloxacin] Nausea And Vomiting  . Sulfa Antibiotics Hives  . Sulfonamide Derivatives Hives   Medications: Dexilant 30 mg capsule, delayed release diclofenac 1 % topical gel diclofenac sodium 75 mg tablet,delayed release EPINEPHrine 0.3 mg/0.3 mL injection, auto-injector fluconazole 150 mg tablet fluticasone propionate 50 mcg/actuation nasal spray,suspension furosemide 40 mg tablet HumaLOG KwikPen U-200 Insulin 200 unit/mL (3 mL) subcutaneous ipratropium 0.5 mg-albuteroL 3 mg (2.5 mg base)/3 mL nebulization soln isosorbide mononitrate ER 60 mg tablet,extended release 24 hr Lantus Solostar U-100 Insulin 100 unit/mL (3 mL) subcutaneous pen levothyroxine 100 mcg  tablet Lyrica 100 mg capsule montelukast 10 mg tablet nystatin 100,000 unit/gram topical cream rosuvastatin 5 mg tablet Trelegy Ellipta 200 mcg-62.5 mcg-25 mcg powder for inhalation triamcinolone acetonide 0.1 % topical cream triamterene 37.5 mg-hydrochlorothiazide 25 mg tablet Ultram 50 mg tablet Ventolin HFA 90 mcg/actuation aerosol inhaler verapamiL ER (SR) 120 mg  tablet,extended release Victoza 3-Pak 0.6 mg/0.1 mL (18 mg/3 mL) subcutaneous pen injector   Review of Systems  Constitutional: Negative.   HENT: Negative.   Eyes: Negative.   Respiratory: Negative.   Cardiovascular: Negative.   Gastrointestinal: Negative.   Endocrine: Negative.   Genitourinary: Negative.   Musculoskeletal: Positive for arthralgias and joint swelling.  Neurological: Negative.     There were no vitals taken for this visit. Physical Exam  Constitutional: She is oriented to person, place, and time. She appears well-developed and well-nourished.  HENT:  Head: Normocephalic.  Eyes: Pupils are equal, round, and reactive to light.  Respiratory: Effort normal and breath sounds normal.  GI: Soft. Bowel sounds are normal.  Musculoskeletal:     Cervical back: Normal range of motion.     Comments: Patient is a 64 year old female.  Range of motion is -5-110. Slight varus laxity. No DVT neurovascularly intact.  Neurological: She is oriented to person, place, and time.  Skin: Skin is warm and dry.    X-rays demonstrate bone-on-bone arthrosis medial compartment of the left knee.  Assessment/Plan Impression: Left knee end-stage osteoarthritis  Plan: Pt with end-stage Left knee DJD, bone-on-bone, refractory to conservative tx, scheduled for Left total knee replacement by Dr. Tonita Cong on June night. We again discussed the procedure itself as well as risks, complications and alternatives, including but not limited to DVT, PE, infx, bleeding, failure of procedure, need for secondary procedure including manipulation, nerve injury, ongoing pain/symptoms, anesthesia risk, even stroke or death. Also discussed typical post-op protocols, activity restrictions, need for PT, flexion/extension exercises, time out of work. Discussed need for DVT ppx post-op per protocol. Discussed dental ppx and infx prevention. Also discussed limitations post-operatively such as kneeling and squatting. All  questions were answered. Patient desires to proceed with surgery as scheduled.  Will hold supplements, ASA and NSAIDs accordingly. Will remain NPO after midnight the night before surgery. Will present to [facility] for pre-op testing. Anticipate hospital stay to include at least 2 midnights given medical history and to ensure proper pain control. Plan ASA 325mg  BID for DVT ppx post-op. Plan Oxycodone, Robaxin, Colace, Miralax. Plan home with HHPT post-op with family members at home for assistance. Will follow up 10-14 days post-op for suture removal and xrays. She has asked for pain medicine in the interim and I have sent her tramadol at her request.  Plan L total knee replacement  Cecilie Kicks, PA-C for Dr. Tonita Cong 01/09/2020, 2:19 PM

## 2020-01-13 ENCOUNTER — Other Ambulatory Visit: Payer: Self-pay

## 2020-01-13 ENCOUNTER — Other Ambulatory Visit (HOSPITAL_COMMUNITY)
Admission: RE | Admit: 2020-01-13 | Discharge: 2020-01-13 | Disposition: A | Discharge status: Home / Self Care | Payer: 59 | Source: Ambulatory Visit | Attending: Specialist | Admitting: Specialist

## 2020-01-13 ENCOUNTER — Ambulatory Visit (INDEPENDENT_AMBULATORY_CARE_PROVIDER_SITE_OTHER): Payer: 59

## 2020-01-13 DIAGNOSIS — Z20822 Contact with and (suspected) exposure to covid-19: Secondary | ICD-10-CM | POA: Diagnosis not present

## 2020-01-13 DIAGNOSIS — J4550 Severe persistent asthma, uncomplicated: Secondary | ICD-10-CM | POA: Diagnosis not present

## 2020-01-13 DIAGNOSIS — Z01812 Encounter for preprocedural laboratory examination: Secondary | ICD-10-CM | POA: Insufficient documentation

## 2020-01-13 LAB — SARS CORONAVIRUS 2 (TAT 6-24 HRS): SARS Coronavirus 2: NEGATIVE

## 2020-01-13 MED ORDER — MEPOLIZUMAB 100 MG ~~LOC~~ SOLR
100.0000 mg | SUBCUTANEOUS | Status: DC
  Administered 2020-01-13: 100 mg via SUBCUTANEOUS

## 2020-01-13 NOTE — Progress Notes (Signed)
All questions were answered by the patient before medication was administered. Have you been hospitalized in the last 10 days? No Do you have a fever? No Do you have a cough? No Do you have a headache or sore throat? No  

## 2020-02-01 ENCOUNTER — Other Ambulatory Visit (HOSPITAL_COMMUNITY)
Admission: RE | Admit: 2020-02-01 | Discharge: 2020-02-01 | Disposition: A | Discharge status: Home / Self Care | Payer: 59 | Source: Ambulatory Visit | Attending: Specialist | Admitting: Specialist

## 2020-02-01 DIAGNOSIS — Z20822 Contact with and (suspected) exposure to covid-19: Secondary | ICD-10-CM | POA: Diagnosis not present

## 2020-02-01 DIAGNOSIS — Z01812 Encounter for preprocedural laboratory examination: Secondary | ICD-10-CM | POA: Diagnosis not present

## 2020-02-01 LAB — SARS CORONAVIRUS 2 (TAT 6-24 HRS): SARS Coronavirus 2: NEGATIVE

## 2020-02-03 ENCOUNTER — Telehealth: Payer: Self-pay | Admitting: Critical Care Medicine

## 2020-02-03 NOTE — Progress Notes (Addendum)
COVID Vaccine Completed: Yes Date COVID Vaccine completed: 11/2019 COVID vaccine manufacturer: Pf[employer]      PCP - Dr. Baldwin Crown Cardiologist - Dr. Corky Downs clearance 12/11/19 telephone encounter in peic  Chest x-ray - greater than  1year EKG - 07/10/2019 in epic Stress Test - greater than  2 years ECHO - 04/23/2019 in epic Cardiac Cath - greater than  2 years  Sleep Study - 03/06/2005 in epic CPAP - Yes  Fasting Blood Sugar - 70-105 Checks Blood Sugar __2-3___ times a day Hgb A1c 7.3 01/07/2020 in epic  Blood Thinner Instructions: N/A Aspirin Instructions: Yes Last Dose: 01/31/2020  Anesthesia review: OSA, CAD, HTN, DM  Patient denies shortness of breath, fever, cough and chest pain at PAT appointment   Patient verbalized understanding of instructions that were given to them at the PAT appointment. Patient was also instructed that they will need to review over the PAT instructions again at home before surgery.

## 2020-02-03 NOTE — Telephone Encounter (Signed)
Nucala Order: 100mg  #1 Vial Order Date: 02/03/2020 Expected date of arrival: 02/04/2020 Ordered by: Desmond Dike, [place]  Specialty Pharmacy: [name]

## 2020-02-03 NOTE — Patient Instructions (Signed)
DUE TO COVID-19 ONLY ONE VISITOR ARE ALLOWED TO COME WITH YOU AND STAY IN THE WAITING ROOM ONLY DURING PRE OP AND PROCEDURE. THEN TWO VISITORS MAY VISIT WITH YOU IN YOUR PRIVATE ROOM DURING VISITING HOURS ONLY!!              COVID SWAB TESTING COMPLETED ON: Saturday, February 01, 2020  (Must self quarantine after testing. Follow instructions on handout.)                        Your procedure is scheduled on: Wednesday, February 05, 2020              Report to [facility] Main  Entrance              Report to Short Stay at 5:30  AM              Call this number if you have problems the morning of surgery [phone]              Do not eat food :After Midnight.              May have liquids until 4:30 AM day of surgery              CLEAR LIQUID DIET  Foods Allowed                                                                     Foods Excluded  Water, Black Coffee and tea, regular and decaf                             liquids that you cannot  Plain Jell-O in any flavor  (No red)                                           see through such as: Fruit ices (not with fruit pulp)                                     milk, soups, orange juice  Iced Popsicles (No red)                                               All solid food Carbonated beverages, regular and diet                                    Apple juices Sports drinks like Gatorade (No red) Lightly seasoned clear broth or consume(fat free) Sugar, honey syrup  Sample Menu Breakfast                                Lunch  Supper Cranberry juice                    Beef broth                            Chicken broth Jell-O                                     Grape juice                           Apple juice Coffee or tea                        Jell-O                                      Popsicle                                                Coffee or tea                        Coffee or  tea              Complete one G2 drink the morning of surgery at 4:30 AM the day of surgery.  Oral Hygieneis also important to reduce your risk of infection.                                   Remember - BRUSH YOUR TEETH THE MORNING OF SURGERY WITH YOUR REGULAR TOOTHPASTE              Do NOT smoke after Midnight              Take these medicines the morning of surgery with A SIP OF WATER: Isosorbide, Levothyroxine, Metoprolol, Pregablin, Rosuvastatin              DO NOT TAKE VICTOZA the night before surgery              The night before surgery on take 20 units of Lantus at bedtime              Use Inhaler the morning of surgery              May use Flonase if needed the morning of surgery              Bring Inhalers with you day of surgery   DO NOT TAKE ANY ORAL DIABETIC MEDICATIONS DAY OF YOUR SURGERY                               You may not have any metal on your body including hair pins, jewelry, and body piercings             Do not wear make-up, lotions, powders, perfumes/cologne, or deodorant             Do not wear nail polish.  Do not shave  48 hours prior to surgery.                        Do not bring valuables to the hospital. [place].              Contacts, dentures or bridgework may not be worn into surgery.              Bring small overnight bag day of surgery.                          Patients discharged the day of surgery will not be allowed to drive home.              Special Instructions: Bring a copy of your healthcare power of attorney and living will documents         the day of surgery if you haven't scanned them in before.              Please read over the following fact sheets you were given: IF YOU HAVE QUESTIONS ABOUT YOUR PRE OP INSTRUCTIONS PLEASE CALL [phone]  HOW TO [place] AND AFTER SURGERY  Why is it important to control my blood sugar before and  after surgery?  Improving blood sugar levels before and after surgery helps healing and can limit problems.  A way of improving blood sugar control is eating a healthy diet by: ?  Eating less sugar and carbohydrates ?  Increasing activity/exercise ?  Talking with your doctor about reaching your blood sugar goals  High blood sugars (greater than 180 mg/dL) can raise your risk of infections and slow your recovery, so you will need to focus on controlling your diabetes during the weeks before surgery.  Make sure that the doctor who takes care of your diabetes knows about your planned surgery including the date and location.  How do I manage my blood sugar before surgery?  Check your blood sugar at least 4 times a day, starting 2 days before surgery, to make sure that the level is not too high or low. ? Check your blood sugar the morning of your surgery when you wake up and every 2 hours until you get to the Short Stay unit.  If your blood sugar is less than 70 mg/dL, you will need to treat for low blood sugar: ? Do not take insulin. ? Treat a low blood sugar (less than 70 mg/dL) with  cup of clear juice (cranberry or apple), 4glucose tablets, OR glucose gel. ? Recheck blood sugar in 15 minutes after treatment (to make sure it is greater than 70 mg/dL). If your blood sugar is not greater than 70 mg/dL on recheck, call [phone] for further instructions.  Report your blood sugar to the short stay nurse when you get to Short Stay.   If you are admitted to the hospital after surgery: ? Your blood sugar will be checked by the staff and you will probably be given insulin after surgery (instead of oral diabetes medicines) to make sure you have good blood sugar levels. ? The goal for blood sugar control after surgery is 80-180 mg/dL.   WHAT DO I DO ABOUT MY DIABETES MEDICATION?   Do not take oral diabetes medicines (pills) the morning of surgery.   DO  NOT TAKE VICTOZA THE NIGHT  BEFORE SURGERY   THE NIGHT BEFORE SURGERY, take 20    units of    Lantus   insulin.                                         The day of surgery, do not take other diabetes injectables, including Byetta (exenatide), Bydureon (exenatide ER), Victoza (liraglutide), or Trulicity (dulaglutide).   If your CBG is greater than 220 mg/dL, you may take  of your sliding scale   (correction) dose of insulin.    Reviewed and Endorsed by [facility] Patient Education Committee, August 2015   [facility] - Preparing for Surgery Before surgery, you can play an important role.  Because skin is not sterile, your skin needs to be as free of germs as possible.  You can reduce the number of germs on your skin by washing with CHG (chlorahexidine gluconate) soap before surgery.  CHG is an antiseptic cleaner which kills germs and bonds with the skin to continue killing germs even after washing. Please DO NOT use if you have an allergy to CHG or antibacterial soaps.  If your skin becomes reddened/irritated stop using the CHG and inform your nurse when you arrive at Short Stay. Do not shave (including legs and underarms) for at least 48 hours prior to the first CHG shower.  You may shave your face/neck.  Please follow these instructions carefully:             1.  Shower with CHG Soap the night before surgery and the  morning of surgery.             2.  If you choose to wash your hair, wash your hair first as usual with your normal  shampoo.             3.  After you shampoo, rinse your hair and body thoroughly to remove the shampoo.                                                 4.  Use CHG as you would any other liquid soap.  You can apply chg directly to the skin and wash.  Gently with a scrungie or clean washcloth.             5.  Apply the CHG Soap to your body ONLY FROM THE NECK DOWN.   Do                not use on face/ open                           Wound or open sores. Avoid contact with eyes, ears  mouth and                        genitals (private parts).                       Wash face,  Genitals (private parts) with your normal soap.             6.  Wash thoroughly, paying special attention to the area where your  surgery  will be performed.             7.  Thoroughly rinse your body with warm water from the neck down.             8.  DO NOT shower/wash with your normal soap after using and rinsing off the CHG Soap.                9.  Pat yourself dry with a clean towel.            10.  Wear clean pajamas.            11.  Place clean sheets on your bed the night of your first shower and do not  sleep with pets. Day of Surgery : Do not apply any lotions/deodorants the morning of surgery.  Please wear clean clothes to the hospital/surgery center.  FAILURE TO FOLLOW THESE INSTRUCTIONS MAY RESULT IN THE CANCELLATION OF YOUR SURGERY  PATIENT SIGNATURE_________________________________  NURSE SIGNATURE__________________________________  ________________________________________________________________________   Adam Phenix  An incentive spirometer is a tool that can help keep your lungs clear and active. This tool measures how well you are filling your lungs with each breath. Taking long deep breaths may help reverse or decrease the chance of developing breathing (pulmonary) problems (especially infection) following:  A long period of time when you are unable to move or be active. BEFORE THE PROCEDURE   If the spirometer includes an indicator to show your best effort, your nurse or respiratory therapist will set it to a desired goal.  If possible, sit up straight or lean slightly forward. Try not to slouch.  Hold the incentive spirometer in an upright position. INSTRUCTIONS FOR USE  1. Sit on the edge of your bed if possible, or sit up as far as you can in bed or on a chair. 2. Hold the incentive spirometer in an upright  position. 3. Breathe out normally. 4. Place the mouthpiece in your mouth and seal your lips tightly around it. 5. Breathe in slowly and as deeply as possible, raising the piston or the ball toward the top of the column. 6. Hold your breath for 3-5 seconds or for as long as possible. Allow the piston or ball to fall to the bottom of the column. 7. Remove the mouthpiece from your mouth and breathe out normally. 8. Rest for a few seconds and repeat Steps 1 through 7 at least 10 times every 1-2 hours when you are awake. Take your time and take a few normal breaths between deep breaths. 9. The spirometer may include an indicator to show your best effort. Use the indicator as a goal to work toward during each repetition. 10. After each set of 10 deep breaths, practice coughing to be sure your lungs are clear. If you have an incision (the cut made at the time of surgery), support your incision when coughing by placing a pillow or rolled up towels firmly against it. Once you are able to get out of bed, walk around indoors and cough well. You may stop using the incentive spirometer when instructed by your caregiver.  RISKS AND COMPLICATIONS  Take your time so you do not get dizzy or light-headed.  If you are in pain, you may need to take or ask for pain medication before doing incentive spirometry. It is harder to take a deep breath if you are having pain. AFTER USE  Rest and breathe slowly and easily.  It can  be helpful to keep track of a log of your progress. Your caregiver can provide you with a simple table to help with this. If you are using the spirometer at home, follow these instructions: [place] IF:   You are having difficultly using the spirometer.  You have trouble using the spirometer as often as instructed.  Your pain medication is not giving enough relief while using the spirometer.  You develop fever of 100.5 F (38.1 C) or higher. SEEK IMMEDIATE MEDICAL CARE IF:    You cough up bloody sputum that had not been present before.  You develop fever of 102 F (38.9 C) or greater.  You develop worsening pain at or near the incision site. MAKE SURE YOU:   Understand these instructions.  Will watch your condition.  Will get help right away if you are not doing well or get worse. Document Released: 12/05/2006 Document Revised: 10/17/2011 Document Reviewed: 02/05/2007 [facility] Patient Information 2014 [place], [place].   ________________________________________________________________________

## 2020-02-04 ENCOUNTER — Encounter (HOSPITAL_COMMUNITY)
Admission: RE | Admit: 2020-02-04 | Discharge: 2020-02-04 | Disposition: A | Discharge status: Home / Self Care | Payer: 59 | Source: Ambulatory Visit | Attending: Specialist | Admitting: Specialist

## 2020-02-04 ENCOUNTER — Encounter (HOSPITAL_COMMUNITY): Payer: Self-pay

## 2020-02-04 ENCOUNTER — Other Ambulatory Visit: Payer: Self-pay

## 2020-02-04 LAB — GLUCOSE, CAPILLARY: Glucose-Capillary: 130 mg/dL — ABNORMAL HIGH (ref 70–99)

## 2020-02-04 LAB — CBC
HCT: 34.8 % — ABNORMAL LOW (ref 36.0–46.0)
Hemoglobin: 10.4 g/dL — ABNORMAL LOW (ref 12.0–15.0)
MCH: 23.5 pg — ABNORMAL LOW (ref 26.0–34.0)
MCHC: 29.9 g/dL — ABNORMAL LOW (ref 30.0–36.0)
MCV: 78.6 fL — ABNORMAL LOW (ref 80.0–100.0)
Platelets: 303 10*3/uL (ref 150–400)
RBC: 4.43 MIL/uL (ref 3.87–5.11)
RDW: 16.2 % — ABNORMAL HIGH (ref 11.5–15.5)
WBC: 6.5 10*3/uL (ref 4.0–10.5)
nRBC: 0 % (ref 0.0–0.2)

## 2020-02-04 LAB — BASIC METABOLIC PANEL
Anion gap: 7 (ref 5–15)
BUN: 28 mg/dL — ABNORMAL HIGH (ref 8–23)
CO2: 29 mmol/L (ref 22–32)
Calcium: 8.3 mg/dL — ABNORMAL LOW (ref 8.9–10.3)
Chloride: 98 mmol/L (ref 98–111)
Creatinine, Ser: 1.4 mg/dL — ABNORMAL HIGH (ref 0.44–1.00)
GFR calc Af Amer: 46 mL/min — ABNORMAL LOW (ref >60)
GFR calc non Af Amer: 40 mL/min — ABNORMAL LOW (ref >60)
Glucose, Bld: 130 mg/dL — ABNORMAL HIGH (ref 70–99)
Potassium: 3.5 mmol/L (ref 3.5–5.1)
Sodium: 134 mmol/L — ABNORMAL LOW (ref 135–145)

## 2020-02-04 LAB — SURGICAL PCR SCREEN
MRSA, PCR: NEGATIVE
Staphylococcus aureus: NEGATIVE

## 2020-02-04 NOTE — Telephone Encounter (Signed)
Nucala Shipment Received: 100mg  #1 vial Medication arrival date: 02/04/2020 Lot #: [id number] Exp date: 05/2023 Received by: Desmond Dike, [place]

## 2020-02-04 NOTE — Anesthesia Preprocedure Evaluation (Addendum)
Anesthesia Evaluation  Patient identified by MRN, date of birth, ID band Patient awake    Reviewed: Allergy & Precautions, NPO status , Patient's Chart, lab work & pertinent test results  History of Anesthesia Complications Negative for: history of anesthetic complications  Airway Mallampati: II  TM Distance: >3 FB Neck ROM: Full    Dental no notable dental hx.    Pulmonary asthma , sleep apnea , former smoker,    Pulmonary exam normal        Cardiovascular hypertension, Pt. on medications and Pt. on home beta blockers + CAD (on Plavix) and + Cardiac Stents (2007)  Normal cardiovascular exam  Echo 04/23/19: EF 60-65%, mild AR    Neuro/Psych negative neurological ROS  negative psych ROS   GI/Hepatic Neg liver ROS, GERD  ,  Endo/Other  diabetes, Type 2, Insulin DependentHypothyroidism   Renal/GU negative Renal ROS  negative genitourinary   Musculoskeletal  (+) Arthritis ,   Abdominal   Peds  Hematology negative hematology ROS (+)   Anesthesia Other Findings Day of surgery medications reviewed with patient.  Reproductive/Obstetrics negative OB ROS                           Anesthesia Physical Anesthesia Plan  ASA: III  Anesthesia Plan: Spinal   Post-op Pain Management:  Regional for Post-op pain   Induction:   PONV Risk Score and Plan: 3 and Treatment may vary due to age or medical condition, Ondansetron, Propofol infusion and Dexamethasone  Airway Management Planned: Natural Airway and Simple Face Mask  Additional Equipment: None  Intra-op Plan:   Post-operative Plan:   Informed Consent: I have reviewed the patients History and Physical, chart, labs and discussed the procedure including the risks, benefits and alternatives for the proposed anesthesia with the patient or authorized representative who has indicated his/her understanding and acceptance.       Plan Discussed  with: CRNA  Anesthesia Plan Comments: (See APP note by Durel Salts, FNP)      Anesthesia Quick Evaluation

## 2020-02-04 NOTE — Progress Notes (Signed)
Anesthesia Chart Review:   Case: [id number] Date/Time: 02/05/20 0715   Procedure: TOTAL KNEE ARTHROPLASTY (Left Knee) - 3 hrs   Anesthesia type: Spinal   Pre-op diagnosis: Left knee degenerative joint disease   Location: [facility] ROOM 05 / [facility]   Surgeons: Susa Day, MD      DISCUSSION: Pt is a 64 year old female with hx CAD (DES to RCA 2007), angina, HTN, DM, asthma  Pt to hold plavix 5-7 day before surgery.    VS: Ht 5\' 2"  (1.575 m)   Wt 84.2 kg   BMI 33.95 kg/m    PROVIDERS: - PCP is Reynold Bowen, MD  - Cardiologist is Shelva Majestic, MD. Last office visit 07/10/19. Pt cleared for surgery 12/12/19 by Reino Bellis, NP.    LABS: Labs reviewed: Acceptable for surgery. (all labs ordered are listed, but only abnormal results are displayed)  Labs Reviewed  BASIC METABOLIC PANEL - Abnormal; Notable for the following components:      Result Value   Sodium 134 (*)    Glucose, Bld 130 (*)    BUN 28 (*)    Creatinine, Ser 1.40 (*)    Calcium 8.3 (*)    GFR calc non Af Amer 40 (*)    GFR calc Af Amer 46 (*)    All other components within normal limits  CBC - Abnormal; Notable for the following components:   Hemoglobin 10.4 (*)    HCT 34.8 (*)    MCV 78.6 (*)    MCH 23.5 (*)    MCHC 29.9 (*)    RDW 16.2 (*)    All other components within normal limits  GLUCOSE, CAPILLARY - Abnormal; Notable for the following components:   Glucose-Capillary 130 (*)    All other components within normal limits  SURGICAL PCR SCREEN    EKG 07/10/19: NSR. Nonspecific T wave abnormality   CV: Echo 04/23/19:  1. The left ventricle has normal systolic function with an ejection fraction of 60-65%. The cavity size was normal. There is mildly increased left ventricular wall thickness. Left ventricular diastolic parameters were normal.  2. The right ventricle has normal systolic function. The cavity was normal.  3. The tricuspid valve is grossly normal.  4. The aortic valve has an  indeterminate number of cusps. Aortic valve regurgitation is mild by color flow Doppler. No stenosis of the aortic valve.  5. The aorta is normal unless otherwise noted.  6. Normal LV function; mild LVH; mild AI.    Cardiac cath 05/29/14:  1. Widely patent right coronary stent 2. Segmental 50% proximal to mid LAD stenosis with FFR of 0.94. The angiographic appearance is unchanged from 2010. 3. False positive myocardial perfusion study 4. Normal left ventricular function 5. Irregularities noted in the circumflex   Past Medical History:  Diagnosis Date  . Acute bronchitis   . Anemia    hx of anemia  . Anginal pain ([place])   . Arthritis    osteo  . Asthma   . Coronary atherosclerosis   . Cough   . GERD (gastroesophageal reflux disease)   . History of colon polyps   . History of migraine   . Hyperlipidemia   . Hypertension   . Obstructive sleep apnea (adult) (pediatric)    NO CPAP   . Peripheral neuropathy   . Type II or unspecified type diabetes mellitus without mention of complication, not stated as uncontrolled    type 2  . Unspecified hypothyroidism  Past Surgical History:  Procedure Laterality Date  . ANTERIOR FUSION CERVICAL SPINE    . CARPAL TUNNEL RELEASE Left   . COLONOSCOPY    . CORONARY ANGIOPLASTY    . LEFT HEART CATHETERIZATION WITH CORONARY ANGIOGRAM N/A 05/29/2014   Procedure: LEFT HEART CATHETERIZATION WITH CORONARY ANGIOGRAM;  Surgeon: Sinclair Grooms, MD;  Location: [facility] CATH LAB;  Service: Cardiovascular;  Laterality: N/A;  . TUBAL LIGATION    . WISDOM TOOTH EXTRACTION      MEDICATIONS: . albuterol (PROVENTIL HFA) 108 (90 BASE) MCG/ACT inhaler  . amoxicillin-clavulanate (AUGMENTIN) 875-125 MG tablet  . aspirin EC 81 MG tablet  . Carboxymethylcellulose Sodium (THERATEARS) 0.25 % SOLN  . clopidogrel (PLAVIX) 75 MG tablet  . diclofenac (VOLTAREN) 75 MG EC tablet  . fluticasone (FLONASE) 50 MCG/ACT nasal spray  . Fluticasone-Umeclidin-Vilant  (TRELEGY ELLIPTA) 100-62.5-25 MCG/INH AEPB  . Fluticasone-Umeclidin-Vilant (TRELEGY ELLIPTA) 200-62.5-25 MCG/INH AEPB  . furosemide (LASIX) 40 MG tablet  . insulin glargine (LANTUS) 100 UNIT/ML injection  . insulin lispro (HUMALOG) 100 UNIT/ML injection  . ipratropium-albuterol (DUONEB) 0.5-2.5 (3) MG/3ML SOLN  . isosorbide mononitrate (IMDUR) 60 MG 24 hr tablet  . levothyroxine (SYNTHROID) 100 MCG tablet  . mepolizumab (NUCALA) 100 MG/ML SOSY  . metoprolol succinate (TOPROL-XL) 25 MG 24 hr tablet  . montelukast (SINGULAIR) 10 MG tablet  . NOVOTWIST 32G X 5 MM MISC  . omega-3 acid ethyl esters (LOVAZA) 1 g capsule  . ONE TOUCH ULTRA TEST test strip  . pregabalin (LYRICA) 100 MG capsule  . Respiratory Therapy Supplies (FLUTTER) DEVI  . rosuvastatin (CRESTOR) 5 MG tablet  . triamterene-hydrochlorothiazide (MAXZIDE-25) 37.5-25 MG tablet  . verapamil (CALAN-SR) 120 MG CR tablet  . VICTOZA 18 MG/3ML SOPN  . White Petrolatum-Mineral Oil (SYSTANE NIGHTTIME) OINT   . Mepolizumab SOLR 100 mg  . Mepolizumab SOLR 100 mg   - Pt to hold plavix 5-7 day before surgery.   If no changes, I anticipate pt can proceed with surgery as scheduled.   [name], FNP-BC [facility] Short Stay Surgical [facility]/Anesthesiology Phone: [phone] 02/04/2020 3:37 PM

## 2020-02-05 ENCOUNTER — Encounter (HOSPITAL_COMMUNITY)
Admission: RE | Disposition: A | Discharge status: Home Health | Payer: Self-pay | Source: Ambulatory Visit | Attending: Specialist

## 2020-02-05 ENCOUNTER — Inpatient Hospital Stay (HOSPITAL_COMMUNITY): Payer: 59 | Admitting: Certified Registered Nurse Anesthetist

## 2020-02-05 ENCOUNTER — Inpatient Hospital Stay (HOSPITAL_COMMUNITY): Payer: 59

## 2020-02-05 ENCOUNTER — Encounter (HOSPITAL_COMMUNITY): Payer: Self-pay | Admitting: Specialist

## 2020-02-05 ENCOUNTER — Inpatient Hospital Stay (HOSPITAL_COMMUNITY)
Admission: RE | Admit: 2020-02-05 | Discharge: 2020-02-07 | DRG: 470 | Disposition: A | Discharge status: Home Health | Payer: 59 | Attending: Specialist | Admitting: Specialist

## 2020-02-05 DIAGNOSIS — Z8042 Family history of malignant neoplasm of prostate: Secondary | ICD-10-CM

## 2020-02-05 DIAGNOSIS — Z794 Long term (current) use of insulin: Secondary | ICD-10-CM

## 2020-02-05 DIAGNOSIS — M21162 Varus deformity, not elsewhere classified, left knee: Secondary | ICD-10-CM | POA: Diagnosis present

## 2020-02-05 DIAGNOSIS — M1712 Unilateral primary osteoarthritis, left knee: Secondary | ICD-10-CM | POA: Diagnosis present

## 2020-02-05 DIAGNOSIS — Z79890 Hormone replacement therapy (postmenopausal): Secondary | ICD-10-CM | POA: Diagnosis not present

## 2020-02-05 DIAGNOSIS — Z96659 Presence of unspecified artificial knee joint: Secondary | ICD-10-CM

## 2020-02-05 DIAGNOSIS — I10 Essential (primary) hypertension: Secondary | ICD-10-CM | POA: Diagnosis present

## 2020-02-05 DIAGNOSIS — Z79899 Other long term (current) drug therapy: Secondary | ICD-10-CM

## 2020-02-05 DIAGNOSIS — G4733 Obstructive sleep apnea (adult) (pediatric): Secondary | ICD-10-CM | POA: Diagnosis present

## 2020-02-05 DIAGNOSIS — Z882 Allergy status to sulfonamides status: Secondary | ICD-10-CM | POA: Diagnosis not present

## 2020-02-05 DIAGNOSIS — J45909 Unspecified asthma, uncomplicated: Secondary | ICD-10-CM | POA: Diagnosis present

## 2020-02-05 DIAGNOSIS — G4730 Sleep apnea, unspecified: Secondary | ICD-10-CM | POA: Diagnosis present

## 2020-02-05 DIAGNOSIS — E785 Hyperlipidemia, unspecified: Secondary | ICD-10-CM | POA: Diagnosis present

## 2020-02-05 DIAGNOSIS — Z87891 Personal history of nicotine dependence: Secondary | ICD-10-CM | POA: Diagnosis not present

## 2020-02-05 DIAGNOSIS — Z8249 Family history of ischemic heart disease and other diseases of the circulatory system: Secondary | ICD-10-CM

## 2020-02-05 DIAGNOSIS — I2510 Atherosclerotic heart disease of native coronary artery without angina pectoris: Secondary | ICD-10-CM | POA: Diagnosis present

## 2020-02-05 DIAGNOSIS — Z20822 Contact with and (suspected) exposure to covid-19: Secondary | ICD-10-CM | POA: Diagnosis present

## 2020-02-05 DIAGNOSIS — Z885 Allergy status to narcotic agent status: Secondary | ICD-10-CM

## 2020-02-05 DIAGNOSIS — Z881 Allergy status to other antibiotic agents status: Secondary | ICD-10-CM | POA: Diagnosis not present

## 2020-02-05 DIAGNOSIS — E1142 Type 2 diabetes mellitus with diabetic polyneuropathy: Secondary | ICD-10-CM | POA: Diagnosis present

## 2020-02-05 DIAGNOSIS — E039 Hypothyroidism, unspecified: Secondary | ICD-10-CM | POA: Diagnosis present

## 2020-02-05 DIAGNOSIS — Z823 Family history of stroke: Secondary | ICD-10-CM

## 2020-02-05 DIAGNOSIS — Z7951 Long term (current) use of inhaled steroids: Secondary | ICD-10-CM | POA: Diagnosis not present

## 2020-02-05 DIAGNOSIS — K219 Gastro-esophageal reflux disease without esophagitis: Secondary | ICD-10-CM | POA: Diagnosis present

## 2020-02-05 HISTORY — PX: TOTAL KNEE ARTHROPLASTY: SHX125

## 2020-02-05 LAB — GLUCOSE, CAPILLARY
Glucose-Capillary: 95 mg/dL (ref 70–99)
Glucose-Capillary: 89 mg/dL (ref 70–99)
Glucose-Capillary: 69 mg/dL — ABNORMAL LOW (ref 70–99)
Glucose-Capillary: 175 mg/dL — ABNORMAL HIGH (ref 70–99)
Glucose-Capillary: 168 mg/dL — ABNORMAL HIGH (ref 70–99)

## 2020-02-05 SURGERY — ARTHROPLASTY, KNEE, TOTAL
Anesthesia: Spinal | Site: Knee | Laterality: Left

## 2020-02-05 MED ORDER — ACETAMINOPHEN 10 MG/ML IV SOLN
1000.0000 mg | INTRAVENOUS | Status: AC
  Administered 2020-02-05: 1000 mg via INTRAVENOUS
  Filled 2020-02-05: qty 100

## 2020-02-05 MED ORDER — TRANEXAMIC ACID-NACL 1000-0.7 MG/100ML-% IV SOLN
1000.0000 mg | INTRAVENOUS | Status: AC
  Administered 2020-02-05: 1000 mg via INTRAVENOUS
  Filled 2020-02-05: qty 100

## 2020-02-05 MED ORDER — UMECLIDINIUM BROMIDE 62.5 MCG/INH IN AEPB
1.0000 | INHALATION_SPRAY | Freq: Every day | RESPIRATORY_TRACT | Status: DC
  Filled 2020-02-05: qty 7

## 2020-02-05 MED ORDER — ORAL CARE MOUTH RINSE: 15.0000 mL | Freq: Once | OROMUCOSAL | Status: AC

## 2020-02-05 MED ORDER — ISOSORBIDE MONONITRATE ER 60 MG PO TB24
60.0000 mg | ORAL_TABLET | Freq: Every day | ORAL | Status: DC
  Administered 2020-02-06 – 2020-02-07 (×2): 60 mg via ORAL
  Filled 2020-02-05 (×2): qty 1

## 2020-02-05 MED ORDER — OXYCODONE HCL 5 MG/5ML PO SOLN: 5.0000 mg | Freq: Once | ORAL | Status: DC | PRN

## 2020-02-05 MED ORDER — ONDANSETRON HCL 4 MG PO TABS: 4.0000 mg | ORAL_TABLET | Freq: Four times a day (QID) | ORAL | Status: DC | PRN

## 2020-02-05 MED ORDER — MIDAZOLAM HCL 2 MG/2ML IJ SOLN
INTRAMUSCULAR | Status: AC
  Filled 2020-02-05: qty 2

## 2020-02-05 MED ORDER — DOCUSATE SODIUM 100 MG PO CAPS
100.0000 mg | ORAL_CAPSULE | Freq: Two times a day (BID) | ORAL | Status: DC
  Administered 2020-02-05 – 2020-02-07 (×5): 100 mg via ORAL
  Filled 2020-02-05 (×5): qty 1

## 2020-02-05 MED ORDER — PROPOFOL 10 MG/ML IV BOLUS
INTRAVENOUS | Status: AC
  Filled 2020-02-05: qty 20

## 2020-02-05 MED ORDER — PHENOL 1.4 % MT LIQD: 1.0000 | OROMUCOSAL | Status: DC | PRN

## 2020-02-05 MED ORDER — FENTANYL CITRATE (PF) 100 MCG/2ML IJ SOLN
INTRAMUSCULAR | Status: DC | PRN
  Administered 2020-02-05: 50 ug via INTRAVENOUS

## 2020-02-05 MED ORDER — ASPIRIN 81 MG PO CHEW
81.0000 mg | CHEWABLE_TABLET | Freq: Two times a day (BID) | ORAL | Status: DC
  Administered 2020-02-06 – 2020-02-07 (×3): 81 mg via ORAL
  Filled 2020-02-05 (×3): qty 1

## 2020-02-05 MED ORDER — VERAPAMIL HCL ER 120 MG PO TBCR
120.0000 mg | EXTENDED_RELEASE_TABLET | Freq: Every day | ORAL | Status: DC
  Administered 2020-02-05 – 2020-02-06 (×2): 120 mg via ORAL
  Filled 2020-02-05 (×3): qty 1

## 2020-02-05 MED ORDER — ACETAMINOPHEN 500 MG PO TABS: 1000.0000 mg | ORAL_TABLET | Freq: Once | ORAL | Status: DC

## 2020-02-05 MED ORDER — INSULIN ASPART 100 UNIT/ML ~~LOC~~ SOLN: 5.0000 [IU] | Freq: Three times a day (TID) | SUBCUTANEOUS | Status: DC

## 2020-02-05 MED ORDER — METOCLOPRAMIDE HCL 5 MG PO TABS: 5.0000 mg | ORAL_TABLET | Freq: Three times a day (TID) | ORAL | Status: DC | PRN

## 2020-02-05 MED ORDER — COLCHICINE 0.6 MG PO TABS
0.6000 mg | ORAL_TABLET | Freq: Every day | ORAL | Status: AC
  Administered 2020-02-05 – 2020-02-06 (×2): 0.6 mg via ORAL
  Filled 2020-02-05 (×2): qty 1

## 2020-02-05 MED ORDER — FLUTICASONE FUROATE-VILANTEROL 200-25 MCG/INH IN AEPB
1.0000 | INHALATION_SPRAY | Freq: Every day | RESPIRATORY_TRACT | Status: DC
  Filled 2020-02-05: qty 28

## 2020-02-05 MED ORDER — LACTATED RINGERS IV SOLN: INTRAVENOUS | Status: DC

## 2020-02-05 MED ORDER — BISACODYL 5 MG PO TBEC: 5.0000 mg | DELAYED_RELEASE_TABLET | Freq: Every day | ORAL | Status: DC | PRN

## 2020-02-05 MED ORDER — METOCLOPRAMIDE HCL 5 MG/ML IJ SOLN: 5.0000 mg | Freq: Three times a day (TID) | INTRAMUSCULAR | Status: DC | PRN

## 2020-02-05 MED ORDER — IPRATROPIUM-ALBUTEROL 0.5-2.5 (3) MG/3ML IN SOLN: 3.0000 mL | RESPIRATORY_TRACT | Status: DC | PRN

## 2020-02-05 MED ORDER — FENTANYL CITRATE (PF) 100 MCG/2ML IJ SOLN: 25.0000 ug | INTRAMUSCULAR | Status: DC | PRN

## 2020-02-05 MED ORDER — SODIUM CHLORIDE 0.9 % IV SOLN
INTRAVENOUS | Status: AC
  Filled 2020-02-05: qty 500000

## 2020-02-05 MED ORDER — MENTHOL 3 MG MT LOZG: 1.0000 | LOZENGE | OROMUCOSAL | Status: DC | PRN

## 2020-02-05 MED ORDER — METOPROLOL SUCCINATE ER 25 MG PO TB24
37.5000 mg | ORAL_TABLET | Freq: Every day | ORAL | Status: DC
  Administered 2020-02-06 – 2020-02-07 (×2): 37.5 mg via ORAL
  Filled 2020-02-05 (×2): qty 2

## 2020-02-05 MED ORDER — FLUTICASONE-UMECLIDIN-VILANT 100-62.5-25 MCG/INH IN AEPB: 1.0000 | INHALATION_SPRAY | Freq: Every day | RESPIRATORY_TRACT | Status: DC

## 2020-02-05 MED ORDER — STERILE WATER FOR IRRIGATION IR SOLN
Status: DC | PRN
  Administered 2020-02-05: 2000 mL

## 2020-02-05 MED ORDER — OXYCODONE HCL 5 MG PO TABS: 5.0000 mg | ORAL_TABLET | Freq: Once | ORAL | Status: DC | PRN

## 2020-02-05 MED ORDER — BUPIVACAINE IN DEXTROSE 0.75-8.25 % IT SOLN
INTRATHECAL | Status: DC | PRN
  Administered 2020-02-05: 1.7 mL via INTRATHECAL

## 2020-02-05 MED ORDER — BUPIVACAINE-EPINEPHRINE (PF) 0.5% -1:200000 IJ SOLN
INTRAMUSCULAR | Status: DC | PRN
Start: 2020-02-05 — End: 2020-02-05
  Administered 2020-02-05: 15 mL via PERINEURAL

## 2020-02-05 MED ORDER — POLYETHYLENE GLYCOL 3350 17 G PO PACK: 17.0000 g | PACK | Freq: Every day | ORAL | Status: DC | PRN

## 2020-02-05 MED ORDER — PREGABALIN 75 MG PO CAPS
75.0000 mg | ORAL_CAPSULE | Freq: Two times a day (BID) | ORAL | Status: DC
  Administered 2020-02-05 – 2020-02-07 (×4): 75 mg via ORAL
  Filled 2020-02-05 (×4): qty 1

## 2020-02-05 MED ORDER — SODIUM CHLORIDE 0.9 % IR SOLN
Status: DC | PRN
  Administered 2020-02-05: 1000 mL

## 2020-02-05 MED ORDER — FLUTICASONE PROPIONATE 50 MCG/ACT NA SUSP
2.0000 | Freq: Every day | NASAL | Status: DC | PRN
  Filled 2020-02-05: qty 16

## 2020-02-05 MED ORDER — ONDANSETRON HCL 4 MG/2ML IJ SOLN
INTRAMUSCULAR | Status: AC
  Filled 2020-02-05: qty 2

## 2020-02-05 MED ORDER — LIP MEDEX EX OINT
TOPICAL_OINTMENT | CUTANEOUS | Status: AC
  Filled 2020-02-05: qty 7

## 2020-02-05 MED ORDER — ARTIFICIAL TEARS OPHTHALMIC OINT
1.0000 "application " | TOPICAL_OINTMENT | Freq: Every day | OPHTHALMIC | Status: DC
  Administered 2020-02-05 – 2020-02-06 (×2): 1 via OPHTHALMIC
  Filled 2020-02-05: qty 3.5

## 2020-02-05 MED ORDER — LIRAGLUTIDE 18 MG/3ML ~~LOC~~ SOPN: 1.8000 mg | PEN_INJECTOR | Freq: Every evening | SUBCUTANEOUS | Status: DC

## 2020-02-05 MED ORDER — CEFAZOLIN SODIUM-DEXTROSE 2-4 GM/100ML-% IV SOLN
2.0000 g | INTRAVENOUS | Status: AC
  Administered 2020-02-05: 2 g via INTRAVENOUS
  Filled 2020-02-05: qty 100

## 2020-02-05 MED ORDER — POLYVINYL ALCOHOL 1.4 % OP SOLN
1.0000 [drp] | Freq: Three times a day (TID) | OPHTHALMIC | Status: DC | PRN
  Filled 2020-02-05: qty 15

## 2020-02-05 MED ORDER — ONDANSETRON HCL 4 MG/2ML IJ SOLN: 4.0000 mg | Freq: Four times a day (QID) | INTRAMUSCULAR | Status: DC | PRN

## 2020-02-05 MED ORDER — TRIAMTERENE-HCTZ 37.5-25 MG PO TABS
1.0000 | ORAL_TABLET | Freq: Every day | ORAL | Status: DC
  Administered 2020-02-06 – 2020-02-07 (×2): 1 via ORAL
  Filled 2020-02-05 (×2): qty 1

## 2020-02-05 MED ORDER — PROPOFOL 1000 MG/100ML IV EMUL
INTRAVENOUS | Status: AC
  Filled 2020-02-05: qty 100

## 2020-02-05 MED ORDER — KCL IN DEXTROSE-NACL 20-5-0.45 MEQ/L-%-% IV SOLN
INTRAVENOUS | Status: AC
  Filled 2020-02-05 (×2): qty 1000

## 2020-02-05 MED ORDER — OXYCODONE HCL 5 MG PO TABS
5.0000 mg | ORAL_TABLET | ORAL | Status: DC | PRN
  Administered 2020-02-05 – 2020-02-07 (×10): 10 mg via ORAL
  Filled 2020-02-05 (×10): qty 2

## 2020-02-05 MED ORDER — ACETAMINOPHEN 500 MG PO TABS
1000.0000 mg | ORAL_TABLET | Freq: Four times a day (QID) | ORAL | Status: AC
  Administered 2020-02-05 – 2020-02-06 (×4): 1000 mg via ORAL
  Filled 2020-02-05 (×4): qty 2

## 2020-02-05 MED ORDER — PROPOFOL 500 MG/50ML IV EMUL
INTRAVENOUS | Status: DC | PRN
  Administered 2020-02-05: 75 ug/kg/min via INTRAVENOUS

## 2020-02-05 MED ORDER — PROMETHAZINE HCL 25 MG/ML IJ SOLN: 6.2500 mg | INTRAMUSCULAR | Status: DC | PRN

## 2020-02-05 MED ORDER — FUROSEMIDE 40 MG PO TABS: 40.0000 mg | ORAL_TABLET | Freq: Every day | ORAL | Status: DC | PRN

## 2020-02-05 MED ORDER — ALBUTEROL SULFATE (2.5 MG/3ML) 0.083% IN NEBU: 3.0000 mL | INHALATION_SOLUTION | Freq: Four times a day (QID) | RESPIRATORY_TRACT | Status: DC | PRN

## 2020-02-05 MED ORDER — CHLORHEXIDINE GLUCONATE 0.12 % MT SOLN: 15.0000 mL | Freq: Once | OROMUCOSAL | Status: DC

## 2020-02-05 MED ORDER — FENTANYL CITRATE (PF) 100 MCG/2ML IJ SOLN
INTRAMUSCULAR | Status: AC
  Filled 2020-02-05: qty 2

## 2020-02-05 MED ORDER — DIPHENHYDRAMINE HCL 12.5 MG/5ML PO ELIX
12.5000 mg | ORAL_SOLUTION | ORAL | Status: DC | PRN
  Administered 2020-02-06: 25 mg via ORAL
  Filled 2020-02-05: qty 10

## 2020-02-05 MED ORDER — PROPOFOL 10 MG/ML IV BOLUS
INTRAVENOUS | Status: DC | PRN
  Administered 2020-02-05: 20 mg via INTRAVENOUS

## 2020-02-05 MED ORDER — ALUM & MAG HYDROXIDE-SIMETH 200-200-20 MG/5ML PO SUSP: 30.0000 mL | ORAL | Status: DC | PRN

## 2020-02-05 MED ORDER — INSULIN ASPART 100 UNIT/ML ~~LOC~~ SOLN
0.0000 [IU] | Freq: Three times a day (TID) | SUBCUTANEOUS | Status: DC
Start: 2020-02-05 — End: 2020-02-05

## 2020-02-05 MED ORDER — CHLORHEXIDINE GLUCONATE 0.12 % MT SOLN
15.0000 mL | Freq: Once | OROMUCOSAL | Status: AC
  Administered 2020-02-05: 15 mL via OROMUCOSAL

## 2020-02-05 MED ORDER — ACETAMINOPHEN 325 MG PO TABS
325.0000 mg | ORAL_TABLET | Freq: Four times a day (QID) | ORAL | Status: DC | PRN
  Administered 2020-02-06 – 2020-02-07 (×2): 650 mg via ORAL
  Filled 2020-02-05 (×2): qty 2

## 2020-02-05 MED ORDER — ORAL CARE MOUTH RINSE: 15.0000 mL | Freq: Once | OROMUCOSAL | Status: DC

## 2020-02-05 MED ORDER — MONTELUKAST SODIUM 10 MG PO TABS
10.0000 mg | ORAL_TABLET | Freq: Every day | ORAL | Status: DC
  Administered 2020-02-05 – 2020-02-06 (×2): 10 mg via ORAL
  Filled 2020-02-05 (×2): qty 1

## 2020-02-05 MED ORDER — MIDAZOLAM HCL 5 MG/5ML IJ SOLN
INTRAMUSCULAR | Status: DC | PRN
  Administered 2020-02-05 (×2): 1 mg via INTRAVENOUS

## 2020-02-05 MED ORDER — PHENYLEPHRINE HCL-NACL 10-0.9 MG/250ML-% IV SOLN
INTRAVENOUS | Status: DC | PRN
  Administered 2020-02-05: 20 ug/min via INTRAVENOUS

## 2020-02-05 MED ORDER — SODIUM CHLORIDE 0.9 % IV SOLN
INTRAVENOUS | Status: DC | PRN
  Administered 2020-02-05: 500 mL

## 2020-02-05 MED ORDER — MAGNESIUM CITRATE PO SOLN: 1.0000 | Freq: Once | ORAL | Status: DC | PRN

## 2020-02-05 MED ORDER — HYDROMORPHONE HCL 1 MG/ML IJ SOLN
0.5000 mg | INTRAMUSCULAR | Status: DC | PRN
  Administered 2020-02-06 (×2): 0.5 mg via INTRAVENOUS
  Filled 2020-02-05 (×3): qty 1

## 2020-02-05 MED ORDER — BUPIVACAINE-EPINEPHRINE 0.25% -1:200000 IJ SOLN
INTRAMUSCULAR | Status: AC
  Filled 2020-02-05: qty 1

## 2020-02-05 MED ORDER — CEFAZOLIN SODIUM-DEXTROSE 2-4 GM/100ML-% IV SOLN
2.0000 g | Freq: Four times a day (QID) | INTRAVENOUS | Status: AC
  Administered 2020-02-05 – 2020-02-06 (×3): 2 g via INTRAVENOUS
  Filled 2020-02-05 (×3): qty 100

## 2020-02-05 MED ORDER — INSULIN GLARGINE 100 UNIT/ML ~~LOC~~ SOLN
40.0000 [IU] | Freq: Every day | SUBCUTANEOUS | Status: DC
  Administered 2020-02-05 – 2020-02-06 (×2): 40 [IU] via SUBCUTANEOUS
  Filled 2020-02-05 (×3): qty 0.4

## 2020-02-05 MED ORDER — PHENYLEPHRINE HCL (PRESSORS) 10 MG/ML IV SOLN
INTRAVENOUS | Status: AC
  Filled 2020-02-05: qty 1

## 2020-02-05 MED ORDER — INSULIN LISPRO 100 UNIT/ML ~~LOC~~ SOLN: 0.0000 [IU] | Freq: Three times a day (TID) | SUBCUTANEOUS | Status: DC | PRN

## 2020-02-05 MED ORDER — BUPIVACAINE-EPINEPHRINE 0.25% -1:200000 IJ SOLN: INTRAMUSCULAR | Status: DC | PRN

## 2020-02-05 SURGICAL SUPPLY — 79 items
AGENT HMST SPONGE THK3/8 (HEMOSTASIS)
ATTUNE PS FEM LT SZ 5 CEM KNEE (Femur) ×2 IMPLANT
ATTUNE PSRP INSR SZ5 6 KNEE (Insert) ×2 IMPLANT
BAG DECANTER FOR FLEXI CONT (MISCELLANEOUS) ×2 IMPLANT
BAG SPEC THK2 15X12 ZIP CLS (MISCELLANEOUS)
BAG ZIPLOCK 12X15 (MISCELLANEOUS) IMPLANT
BASE TIBIAL ROT PLAT SZ 5 KNEE (Knees) ×1 IMPLANT
BLADE SAG 18X100X1.27 (BLADE) ×2 IMPLANT
BLADE SAW SGTL 11.0X1.19X90.0M (BLADE) ×2 IMPLANT
BLADE SAW SGTL 13.0X1.19X90.0M (BLADE) ×2 IMPLANT
BLADE SURG SZ10 CARB STEEL (BLADE) ×4 IMPLANT
BNDG ELASTIC 4X5.8 VLCR STR LF (GAUZE/BANDAGES/DRESSINGS) ×2 IMPLANT
BNDG ELASTIC 6X5.8 VLCR STR LF (GAUZE/BANDAGES/DRESSINGS) ×2 IMPLANT
BSPLAT TIB 5 CMNT ROT PLAT STR (Knees) ×1 IMPLANT
CEMENT HV SMART SET (Cement) ×4 IMPLANT
COVER SURGICAL LIGHT HANDLE (MISCELLANEOUS) ×2 IMPLANT
COVER WAND RF STERILE (DRAPES) IMPLANT
CUFF TOURN SGL QUICK 34 (TOURNIQUET CUFF) ×2
CUFF TRNQT CYL 34X4.125X (TOURNIQUET CUFF) ×1 IMPLANT
DECANTER SPIKE VIAL GLASS SM (MISCELLANEOUS) ×2 IMPLANT
DRAPE INCISE IOBAN 66X45 STRL (DRAPES) IMPLANT
DRAPE ORTHO SPLIT 77X108 STRL (DRAPES) ×4
DRAPE SHEET LG 3/4 BI-LAMINATE (DRAPES) ×4 IMPLANT
DRAPE SURG ORHT 6 SPLT 77X108 (DRAPES) ×2 IMPLANT
DRAPE U-SHAPE 47X51 STRL (DRAPES) ×2 IMPLANT
DRSG AQUACEL AG ADV 3.5X10 (GAUZE/BANDAGES/DRESSINGS) ×1 IMPLANT
DRSG TEGADERM 4X4.75 (GAUZE/BANDAGES/DRESSINGS) IMPLANT
DURAPREP 26ML APPLICATOR (WOUND CARE) ×2 IMPLANT
ELECT BLADE TIP CTD 4 INCH (ELECTRODE) ×2 IMPLANT
ELECT REM PT RETURN 15FT ADLT (MISCELLANEOUS) ×2 IMPLANT
EVACUATOR 1/8 PVC DRAIN (DRAIN) IMPLANT
GAUZE SPONGE 2X2 8PLY STRL LF (GAUZE/BANDAGES/DRESSINGS) IMPLANT
GLOVE BIOGEL PI IND STRL 7.5 (GLOVE) ×1 IMPLANT
GLOVE BIOGEL PI IND STRL 8 (GLOVE) ×1 IMPLANT
GLOVE BIOGEL PI INDICATOR 7.5 (GLOVE) ×1
GLOVE BIOGEL PI INDICATOR 8 (GLOVE) ×1
GLOVE SURG SS PI 7.5 STRL IVOR (GLOVE) ×4 IMPLANT
GLOVE SURG SS PI 8.0 STRL IVOR (GLOVE) ×4 IMPLANT
GOWN STRL REUS W/TWL XL LVL3 (GOWN DISPOSABLE) ×4 IMPLANT
HANDPIECE INTERPULSE COAX TIP (DISPOSABLE) ×2
HEMOSTAT SPONGE AVITENE ULTRA (HEMOSTASIS) ×1 IMPLANT
HOLDER FOLEY CATH W/STRAP (MISCELLANEOUS) IMPLANT
IMMOBILIZER KNEE 20 (SOFTGOODS) ×2
IMMOBILIZER KNEE 20 THIGH 36 (SOFTGOODS) ×1 IMPLANT
KIT TURNOVER KIT A (KITS) ×2 IMPLANT
MANIFOLD NEPTUNE II (INSTRUMENTS) ×2 IMPLANT
NDL SAFETY ECLIPSE 18X1.5 (NEEDLE) IMPLANT
NEEDLE HYPO 18GX1.5 SHARP (NEEDLE)
NS IRRIG 1000ML POUR BTL (IV SOLUTION) IMPLANT
PACK TOTAL KNEE CUSTOM (KITS) ×2 IMPLANT
PATELLA MEDIAL ATTUN 35MM KNEE (Knees) ×2 IMPLANT
PENCIL SMOKE EVACUATOR (MISCELLANEOUS) IMPLANT
PIN DRILL FIX HALF THREAD (BIT) ×1 IMPLANT
PIN STEINMAN FIXATION KNEE (PIN) ×1 IMPLANT
PROTECTOR NERVE ULNAR (MISCELLANEOUS) ×2 IMPLANT
SEALER BIPOLAR AQUA 6.0 (INSTRUMENTS) ×2 IMPLANT
SET HNDPC FAN SPRY TIP SCT (DISPOSABLE) ×1 IMPLANT
SPONGE GAUZE 2X2 STER 10/PKG (GAUZE/BANDAGES/DRESSINGS)
SPONGE SURGIFOAM ABS GEL 100 (HEMOSTASIS) IMPLANT
STAPLER VISISTAT (STAPLE) IMPLANT
STRIP CLOSURE SKIN 1/2X4 (GAUZE/BANDAGES/DRESSINGS) ×1 IMPLANT
SUT BONE WAX W31G (SUTURE) IMPLANT
SUT MNCRL AB 4-0 PS2 18 (SUTURE) IMPLANT
SUT STRATAFIX 0 PDS 27 VIOLET (SUTURE) ×2
SUT VIC AB 1 CT1 27 (SUTURE) ×4
SUT VIC AB 1 CT1 27XBRD ANTBC (SUTURE) ×2 IMPLANT
SUT VIC AB 1 CTX 36 (SUTURE)
SUT VIC AB 1 CTX36XBRD ANBCTR (SUTURE) IMPLANT
SUT VIC AB 2-0 CT1 27 (SUTURE) ×6
SUT VIC AB 2-0 CT1 TAPERPNT 27 (SUTURE) ×3 IMPLANT
SUTURE STRATFX 0 PDS 27 VIOLET (SUTURE) ×1 IMPLANT
SYR 3ML LL SCALE MARK (SYRINGE) IMPLANT
SYR 50ML LL SCALE MARK (SYRINGE) IMPLANT
TIBIAL BASE ROT PLAT SZ 5 KNEE (Knees) ×2 IMPLANT
TOWER CARTRIDGE SMART MIX (DISPOSABLE) ×2 IMPLANT
WATER STERILE IRR 1000ML POUR (IV SOLUTION) ×4 IMPLANT
WIPE CHG CHLORHEXIDINE 2% (PERSONAL CARE ITEMS) ×2 IMPLANT
WRAP KNEE MAXI GEL POST OP (GAUZE/BANDAGES/DRESSINGS) ×2 IMPLANT
YANKAUER SUCT BULB TIP 10FT TU (MISCELLANEOUS) ×1 IMPLANT

## 2020-02-05 NOTE — Interval H&P Note (Signed)
History and Physical Interval Note:  02/05/2020 7:15 AM  [name]  has presented today for surgery, with the diagnosis of Left knee degenerative joint disease.  The various methods of treatment have been discussed with the patient and family. After consideration of risks, benefits and other options for treatment, the patient has consented to  Procedure(s) with comments: TOTAL KNEE ARTHROPLASTY (Left) - 3 hrs as a surgical intervention.  The patient's history has been reviewed, patient examined, no change in status, stable for surgery.  I have reviewed the patient's chart and labs.  Questions were answered to the patient's satisfaction.     Johnn Hai

## 2020-02-05 NOTE — Anesthesia Procedure Notes (Addendum)
Spinal  Patient location during procedure: OR End time: 02/05/2020 7:29 AM Staffing Performed: resident/CRNA  Anesthesiologist: Brennan Bailey, MD Resident/CRNA: Maxwell Caul, CRNA Preanesthetic Checklist Completed: patient identified, IV checked, site marked, risks and benefits discussed, surgical consent, monitors and equipment checked, pre-op evaluation and timeout performed Spinal Block Patient position: sitting Prep: DuraPrep Patient monitoring: heart rate, cardiac monitor, continuous pulse ox and blood pressure Approach: midline Location: L3-4 Injection technique: single-shot Needle Needle type: Pencan  Needle gauge: 24 G Needle length: 10 cm Assessment Sensory level: T4 Additional Notes IV functioning, monitors applied to pt. Expiration date of kit checked and confirmed to be in date. Sterile prep and drape, hand hygiene and sterile gloves used. Pt was positioned and spine was prepped in sterile fashion. Skin was anesthetized with lidocaine. Free flow of clear CSF obtained prior to injecting local anesthetic into CSF x 1 attempt. Spinal needle aspirated freely following injection. Needle was carefully withdrawn, and pt tolerated procedure well. Loss of motor and sensory on exam post injection. Dr Daiva Huge at bedside for entire placement.

## 2020-02-05 NOTE — Transfer of Care (Signed)
Immediate Anesthesia Transfer of Care Note  Patient: [name]  Procedure(s) Performed: TOTAL KNEE ARTHROPLASTY (Left Knee)  Patient Location: PACU  Anesthesia Type:Regional and Spinal  Level of Consciousness: awake, alert  and oriented  Airway & Oxygen Therapy: Patient Spontanous Breathing and Patient connected to face mask oxygen  Post-op Assessment: Report given to RN and Post -op Vital signs reviewed and stable  Post vital signs: Reviewed and stable  Last Vitals:  Vitals Value Taken Time  BP 103/63 02/05/20 0958  Temp    Pulse 66 02/05/20 0959  Resp 15 02/05/20 0959  SpO2 97 % 02/05/20 0959  Vitals shown include unvalidated device data.  Last Pain:  Vitals:   02/05/20 2712  TempSrc: Oral  PainSc:          Complications: No complications documented.

## 2020-02-05 NOTE — Discharge Instructions (Signed)
Elevate leg above heart 6x a day for 20minutes each Use knee immobilizer while walking until can SLR x 10 Use knee immobilizer in bed to keep knee in extension Aquacel dressing may remain in place until follow up. May shower with aquacel dressing in place. If the dressing becomes saturated or peels off, you may remove aquacel dressing. Do not remove steri-strips if they are present. Place new dressing with gauze and tape or ACE bandage which should be kept clean and dry and changed daily.  INSTRUCTIONS AFTER JOINT REPLACEMENT   o Remove items at home which could result in a fall. This includes throw rugs or furniture in walking pathways o ICE to the affected joint every three hours while awake for 30 minutes at a time, for at least the first 3-5 days, and then as needed for pain and swelling.  Continue to use ice for pain and swelling. You may notice swelling that will progress down to the foot and ankle.  This is normal after surgery.  Elevate your leg when you are not up walking on it.   o Continue to use the breathing machine you got in the hospital (incentive spirometer) which will help keep your temperature down.  It is common for your temperature to cycle up and down following surgery, especially at night when you are not up moving around and exerting yourself.  The breathing machine keeps your lungs expanded and your temperature down.   DIET:  As you were doing prior to hospitalization, we recommend a well-balanced diet.  DRESSING / WOUND CARE / SHOWERING  Keep the surgical dressing until follow up.  The dressing is water proof, so you can shower without any extra covering.  IF THE DRESSING FALLS OFF or the wound gets wet inside, change the dressing with sterile gauze.  Please use good hand washing techniques before changing the dressing.  Do not use any lotions or creams on the incision until instructed by your surgeon.    ACTIVITY  o Increase activity slowly as tolerated, but follow the  weight bearing instructions below.   o No driving for 6 weeks or until further direction given by your physician.  You cannot drive while taking narcotics.  o No lifting or carrying greater than 10 lbs. until further directed by your surgeon. o Avoid periods of inactivity such as sitting longer than an hour when not asleep. This helps prevent blood clots.  o You may return to work once you are authorized by your doctor.     WEIGHT BEARING   Weight bearing as tolerated with assist device (walker, cane, etc) as directed, use it as long as suggested by your surgeon or therapist, typically at least 4-6 weeks.   EXERCISES  Results after joint replacement surgery are often greatly improved when you follow the exercise, range of motion and muscle strengthening exercises prescribed by your doctor. Safety measures are also important to protect the joint from further injury. Any time any of these exercises cause you to have increased pain or swelling, decrease what you are doing until you are comfortable again and then slowly increase them. If you have problems or questions, call your caregiver or physical therapist for advice.   Rehabilitation is important following a joint replacement. After just a few days of immobilization, the muscles of the leg can become weakened and shrink (atrophy).  These exercises are designed to build up the tone and strength of the thigh and leg muscles and to improve motion. Often   times heat used for twenty to thirty minutes before working out will loosen up your tissues and help with improving the range of motion but do not use heat for the first two weeks following surgery (sometimes heat can increase post-operative swelling).   These exercises can be done on a training (exercise) mat, on the floor, on a table or on a bed. Use whatever works the best and is most comfortable for you.    Use music or television while you are exercising so that the exercises are a pleasant  break in your day. This will make your life better with the exercises acting as a break in your routine that you can look forward to.   Perform all exercises about fifteen times, three times per day or as directed.  You should exercise both the operative leg and the other leg as well.  Exercises include:   . Quad Sets - Tighten up the muscle on the front of the thigh (Quad) and hold for 5-10 seconds.   . Straight Leg Raises - With your knee straight (if you were given a brace, keep it on), lift the leg to 60 degrees, hold for 3 seconds, and slowly lower the leg.  Perform this exercise against resistance later as your leg gets stronger.  . Leg Slides: Lying on your back, slowly slide your foot toward your buttocks, bending your knee up off the floor (only go as far as is comfortable). Then slowly slide your foot back down until your leg is flat on the floor again.  . Angel Wings: Lying on your back spread your legs to the side as far apart as you can without causing discomfort.  . Hamstring Strength:  Lying on your back, push your heel against the floor with your leg straight by tightening up the muscles of your buttocks.  Repeat, but this time bend your knee to a comfortable angle, and push your heel against the floor.  You may put a pillow under the heel to make it more comfortable if necessary.   A rehabilitation program following joint replacement surgery can speed recovery and prevent re-injury in the future due to weakened muscles. Contact your doctor or a physical therapist for more information on knee rehabilitation.    CONSTIPATION  Constipation is defined medically as fewer than three stools per week and severe constipation as less than one stool per week.  Even if you have a regular bowel pattern at home, your normal regimen is likely to be disrupted due to multiple reasons following surgery.  Combination of anesthesia, postoperative narcotics, change in appetite and fluid intake all can  affect your bowels.   YOU MUST use at least one of the following options; they are listed in order of increasing strength to get the job done.  They are all available over the counter, and you may need to use some, POSSIBLY even all of these options:    Drink plenty of fluids (prune juice may be helpful) and high fiber foods Colace 100 mg by mouth twice a day  Senokot for constipation as directed and as needed Dulcolax (bisacodyl), take with full glass of water  Miralax (polyethylene glycol) once or twice a day as needed.  If you have tried all these things and are unable to have a bowel movement in the first 3-4 days after surgery call either your surgeon or your primary doctor.    If you experience loose stools or diarrhea, hold the medications until you stool   forms back up.  If your symptoms do not get better within 1 week or if they get worse, check with your doctor.  If you experience "the worst abdominal pain ever" or develop nausea or vomiting, please contact the office immediately for further recommendations for treatment.   ITCHING:  If you experience itching with your medications, try taking only a single pain pill, or even half a pain pill at a time.  You can also use Benadryl over the counter for itching or also to help with sleep.   TED HOSE STOCKINGS:  Use stockings on both legs until for at least 2 weeks or as directed by physician office. They may be removed at night for sleeping.  MEDICATIONS:  See your medication summary on the "After Visit Summary" that nursing will review with you.  You may have some home medications which will be placed on hold until you complete the course of blood thinner medication.  It is important for you to complete the blood thinner medication as prescribed.  PRECAUTIONS:  If you experience chest pain or shortness of breath - call 911 immediately for transfer to the hospital emergency department.   If you develop a fever greater that 101 F, purulent  drainage from wound, increased redness or drainage from wound, foul odor from the wound/dressing, or calf pain - CONTACT YOUR SURGEON.                                                   FOLLOW-UP APPOINTMENTS:  If you do not already have a post-op appointment, please call the office for an appointment to be seen by your surgeon.  Guidelines for how soon to be seen are listed in your "After Visit Summary", but are typically between 1-4 weeks after surgery.  OTHER INSTRUCTIONS:   Knee Replacement:  Do not place pillow under knee, focus on keeping the knee straight while resting. CPM instructions: 0-90 degrees, 2 hours in the morning, 2 hours in the afternoon, and 2 hours in the evening. Place foam block, curve side up under heel at all times except when in CPM or when walking.  DO NOT modify, tear, cut, or change the foam block in any way.   DENTAL ANTIBIOTICS:  In most cases prophylactic antibiotics for Dental procdeures after total joint surgery are not necessary.  Exceptions are as follows:  1. History of prior total joint infection  2. Severely immunocompromised (Organ Transplant, cancer chemotherapy, Rheumatoid biologic meds such as [place])  3. Poorly controlled diabetes (A1C &gt; 8.0, blood glucose over 200)  If you have one of these conditions, contact your surgeon for an antibiotic prescription, prior to your dental procedure.   MAKE SURE YOU:  . Understand these instructions.  . Get help right away if you are not doing well or get worse.    Thank you for letting us be a part of your medical care team.  It is a privilege we respect greatly.  We hope these instructions will help you stay on track for a fast and full recovery!

## 2020-02-05 NOTE — Brief Op Note (Signed)
02/05/2020  9:44 AM  PATIENT:  [name]  64 y.o. female  PRE-OPERATIVE DIAGNOSIS:  Left knee degenerative joint disease  POST-OPERATIVE DIAGNOSIS:  Left knee degenerative joint disease  PROCEDURE:  Procedure(s) with comments: TOTAL KNEE ARTHROPLASTY (Left) - 3 hrs  SURGEON:  Surgeon(s) and Role:    Susa Day, MD - Primary  PHYSICIAN ASSISTANT:   ASSISTANTS: Bissell   ANESTHESIA:   general  EBL:  50 mL   BLOOD ADMINISTERED:none  DRAINS: none   LOCAL MEDICATIONS USED:  MARCAINE     SPECIMEN:  No Specimen  DISPOSITION OF SPECIMEN:  PATHOLOGY  COUNTS:  YES  TOURNIQUET:   Total Tourniquet Time Documented: Thigh (Left) - 61 minutes Total: Thigh (Left) - 61 minutes   DICTATION: .Other Dictation: Dictation Number  number not given at end of dictation.  PLAN OF CARE: Admit to inpatient   PATIENT DISPOSITION:  PACU - hemodynamically stable.   Delay start of Pharmacological VTE agent (>24hrs) due to surgical blood loss or risk of bleeding: no

## 2020-02-05 NOTE — Evaluation (Signed)
Physical Therapy Evaluation Patient Details [name] MRN: [id number] DOB: 27-Jan-1956 Today's Date: 02/05/2020   History of Present Illness  Patient is 64 y.o. female s/p Lt TKA on 02/05/20 with PMH significant for DM, HTN, HLD, GERD, migraine, asthma, OA, angina, anemia, Lt cardiac cath in 2015, Lt CTR, ACDF to cervical spine.     Clinical Impression  [name] is a 64 y.o. female POD 0 s/p Lt TKA. Patient reports independence with mobility at baseline. Patient is now limited by functional impairments (see PT problem list below) and requires min assist for transfers and gait with RW. Patient was able to ambulate ~35 feet with RW and min assist. Patient instructed in exercise to facilitate ROM and circulation. Patient will benefit from continued skilled PT interventions to address impairments and progress towards PLOF. Acute PT will follow to progress mobility and stair training in preparation for safe discharge home.     Follow Up Recommendations Follow surgeon's recommendation for DC plan and follow-up therapies;Outpatient PT    Equipment Recommendations  None recommended by PT    Recommendations for Other Services       Precautions / Restrictions Precautions Precautions: Fall Restrictions Weight Bearing Restrictions: No Other Position/Activity Restrictions: WBAT      Mobility  Bed Mobility Overal bed mobility: Needs Assistance Bed Mobility: Supine to Sit     Supine to sit: Min assist;HOB elevated     General bed mobility comments: assist to bring Lt LE to EOB, no assist required for trunk.   Transfers Overall transfer level: Needs assistance Equipment used: Rolling walker (2 wheeled) Transfers: Sit to/from Stand Sit to Stand: Min assist         General transfer comment: cues for technique with RW, assist for power up, pt steady with rising. cues for safe reach back to sit in recliner.   Ambulation/Gait Ambulation/Gait assistance: Min  assist Gait Distance (Feet): 65 Feet Assistive device: Rolling walker (2 wheeled) Gait Pattern/deviations: Step-to pattern;Decreased stride length;Decreased stance time - left;Decreased weight shift to left Gait velocity: decreased   General Gait Details: VC's or safe step pattern and proximty to RW. no overt LOB noted with gait. knee immobilizer to prevent buckling on Lt LE. intermittent assist for walker positioning.   Stairs            Wheelchair Mobility    Modified Rankin (Stroke Patients Only)       Balance Overall balance assessment: Needs assistance Sitting-balance support: Feet supported Sitting balance-Leahy Scale: Good     Standing balance support: During functional activity;Bilateral upper extremity supported Standing balance-Leahy Scale: Poor              Pertinent Vitals/Pain Pain Assessment: 0-10 Pain Score: 7  Pain Location: Lt knee Pain Descriptors / Indicators: Aching;Discomfort;Sore Pain Intervention(s): Limited activity within patient's tolerance;Monitored during session;Premedicated before session;Repositioned;Ice applied    Home Living Family/patient expects to be discharged to:: Private residence Living Arrangements: Other relatives Available Help at Discharge: Family Type of Home: House Home Access: Stairs to enter Entrance Stairs-Rails: Can reach both Entrance Stairs-Number of Steps: 4 at front, 2 at back, 3-4 at garage (hand rails on both) Home Layout: One level;Two level;Able to live on main level with bedroom/bathroom;Full bath on main level (lives on main level) Home Equipment: Walker - 2 wheels;Cane - single point      Prior Function Level of Independence: Independent           Hand Dominance   Dominant Hand:  Right    Extremity/Trunk Assessment   Upper Extremity Assessment Upper Extremity Assessment: Overall WFL for tasks assessed    Lower Extremity Assessment Lower Extremity Assessment: LLE deficits/detail LLE  Deficits / Details: pt with poor quad activation and unable to comeplete SLR LLE: Unable to fully assess due to pain;Unable to fully assess due to immobilization    Cervical / Trunk Assessment Cervical / Trunk Assessment: Normal  Communication   Communication: No difficulties  Cognition Arousal/Alertness: Awake/alert Behavior During Therapy: WFL for tasks assessed/performed Overall Cognitive Status: Within Functional Limits for tasks assessed           General Comments      Exercises Total Joint Exercises Ankle Circles/Pumps: AROM;Both;20 reps;Seated Quad Sets: AROM;Left;10 reps;Seated   Assessment/Plan    PT Assessment Patient needs continued PT services  PT Problem List Decreased strength;Decreased range of motion;Decreased activity tolerance;Decreased balance;Decreased mobility;Decreased knowledge of use of DME;Pain       PT Treatment Interventions DME instruction;Gait training;Stair training;Functional mobility training;Therapeutic activities;Therapeutic exercise;Balance training;Patient/family education    PT Goals (Current goals can be found in the Care Plan section)  Acute Rehab PT Goals Patient Stated Goal: get back home and to independence PT Goal Formulation: With patient Time For Goal Achievement: 02/12/20 Potential to Achieve Goals: Good    Frequency 7X/week    AM-PAC PT "6 Clicks" Mobility  Outcome Measure Help needed turning from your back to your side while in a flat bed without using bedrails?: None Help needed moving from lying on your back to sitting on the side of a flat bed without using bedrails?: A Little Help needed moving to and from a bed to a chair (including a wheelchair)?: A Little Help needed standing up from a chair using your arms (e.g., wheelchair or bedside chair)?: A Little Help needed to walk in hospital room?: A Little Help needed climbing 3-5 steps with a railing? : A Lot 6 Click Score: 18    End of Session Equipment Utilized  During Treatment: Gait belt;Left knee immobilizer Activity Tolerance: Patient tolerated treatment well     PT Visit Diagnosis: Muscle weakness (generalized) (M62.81);Difficulty in walking, not elsewhere classified (R26.2)    Time: 4665-9935 PT Time Calculation (min) (ACUTE ONLY): 32 min   Charges:   PT Evaluation $PT Eval Low Complexity: 1 Low PT Treatments $Gait Training: 8-22 mins        Verner Mould, DPT Acute Rehabilitation Services  Office [phone] Pager [phone]  02/05/2020 1:45 PM

## 2020-02-05 NOTE — Anesthesia Procedure Notes (Signed)
Anesthesia Regional Block: Adductor canal block   Pre-Anesthetic Checklist: ,, timeout performed, Correct Patient, Correct Site, Correct Laterality, Correct Procedure, Correct Position, site marked, Risks and benefits discussed, pre-op evaluation,  At surgeon's request and post-op pain management  Laterality: Left  Prep: Maximum Sterile Barrier Precautions used, chloraprep       Needles:  Injection technique: Single-shot  Needle Type: Echogenic Stimulator Needle     Needle Length: 9cm  Needle Gauge: 22     Additional Needles:   Procedures:,,,, ultrasound used (permanent image in chart),,,,  Narrative:  Start time: 02/05/2020 6:57 AM End time: 02/05/2020 6:59 AM Injection made incrementally with aspirations every 5 mL.  Performed by: Personally  Anesthesiologist: Brennan Bailey, MD  Additional Notes: Risks, benefits, and alternative discussed. Patient gave consent for procedure. Patient prepped and draped in sterile fashion. Sedation administered, patient remains easily responsive to voice. Relevant anatomy identified with ultrasound guidance. Local anesthetic given in 5cc increments with no signs or symptoms of intravascular injection. No pain or paraesthesias with injection. Patient monitored throughout procedure with signs of LAST or immediate complications. Tolerated well. Ultrasound image placed in chart.  Tawny Asal, MD

## 2020-02-05 NOTE — Anesthesia Postprocedure Evaluation (Signed)
Anesthesia Post Note  Patient: [name]  Procedure(s) Performed: TOTAL KNEE ARTHROPLASTY (Left Knee)     Patient location during evaluation: PACU Anesthesia Type: Spinal Level of consciousness: awake and alert and oriented Pain management: pain level controlled Vital Signs Assessment: post-procedure vital signs reviewed and stable Respiratory status: spontaneous breathing, nonlabored ventilation and respiratory function stable Cardiovascular status: blood pressure returned to baseline Postop Assessment: no apparent nausea or vomiting and spinal receding Anesthetic complications: no   No complications documented.  Last Vitals:  Vitals:   02/05/20 1226 02/05/20 1325  BP: 108/67 (!) 91/53  Pulse: (!) 58 (!) 53  Resp: 17 17  Temp: (!) 36.4 C 36.5 C  SpO2: 99% 98%    Last Pain:  Vitals:   02/05/20 1226  TempSrc: Oral  PainSc:                  Brennan Bailey

## 2020-02-05 NOTE — Op Note (Signed)
[name], [name] MEDICAL RECORD [id number] ACCOUNT [id number] DATE OF BIRTH:Jan 28, 1956 FACILITY: [facility] LOCATION: [facility] PHYSICIAN:Artasia Thang Windy Kalata, MD  OPERATIVE REPORT  DATE OF PROCEDURE:  02/05/2020  PREOPERATIVE DIAGNOSIS:  End-stage osteoarthritis, varus deformity, left knee.  POSTOPERATIVE DIAGNOSIS:  End-stage osteoarthritis, varus deformity, left knee.  PROCEDURE PERFORMED:  Left total knee arthroplasty utilizing DePuy rotating platform Attune 5 femur, 5 tibia, 6 mm insert, 38 patella.  ANESTHESIA:  General.  HISTORY:  A 64 year old with end-stage osteoarthrosis of medial compartment and varus deformity, left knee.  Came for replacement of degenerative joint.  Risks and benefits discussed including bleeding, infection, damage to neurovascular structures,  suboptimal range of motion, DVT, PE, anesthetic complications, need for revision, etc.  TECHNIQUE:  With the patient in supine position after induction of adequate general anesthesia, left lower extremity was prepped and draped in the usual sterile fashion, exsanguinated with a thigh tourniquet inflated to 250 mmHg.  Knee was flexed.   Midline incision was made over the patella.  Full thickness flaps developed.  Median parapatellar arthrotomy was performed.  Patella everted, knee flexed.  We had to elevate soft tissues medially, preserving the MCL.  Severe tricompartmental  osteoarthrosis was noted.  A Leksell was utilized to remove the osteophytes.  Remnants of the medial and lateral menisci and the ACL.  The geniculates were cauterized.  The popliteus was centrally frayed and barely intact.  Next, we used a step drill to  enter the femoral canal.  It was irrigated and a 5 degree left was inserted with 9 off the distal femur.  This was pinned and the oscillating saw performed a distal femoral cut with soft tissues protected at all times with Cregos.  Next, we sized the  distal femur off the anterior cortex to a 5/3 degree  left.  Distal femoral cutting block was then applied.  We then performed the anterior, posterior and chamfer cuts.  Again, soft tissues protected at all times.  Following this, the tibia was subluxed.   External alignment guide utilized.  We used 8 off the high side.  It was fairly deficient posteromedially.  Three degree slope bisecting the tibiotalar joint.  Parallel to the shaft.  Distal proximal tibia cut was then performed with soft tissues  protected posteriorly at all times.  There was some sclerotic residual posteromedially.  Following this, we checked the flexion and extension gap and they were equivalent.  We then turned our attention towards completing the tibia.  It was sized to a 5  just to the medial aspect of the tibial tubercle.  Bone graft harvested from the tibial canal.  This was pinned.  We drilled centrally and utilized our punch guide.  Then, we returned to the femur, performed our box cut after applying the appropriate  jig.  Bisecting the notch.  The notch cut was then performed.  Further contoured with a bare rongeur and a rasp.  A trial femur, 5 was inserted.  Local holes drilled.  We then placed a 5 and then a 6 mm insert, 6 was better fitting.  We reduced it and  had full extension, full flexion, good stability with varus valgus stressing at 0-30 degrees.  Negative anterior drawer.  We turned our attention to the patella.  It was everted, measured it at 24.  We used a 7.5 cutting block.  External alignment jig.   We then performed our cut.  With 15 residual.  We drilled our peg holes medializing the patella after utilizing  the paddle parallel to the joint surface.  This was then reduced and we had excellent patellofemoral tracking.  All trials were removed.  We checked posteriorly.  Capsule was intact.  We used an Aquamantys for cauterization.  We then copiously irrigated with pulsatile lavage and antibiotic  irrigation.  We then flexed the knee, subluxed the tibia.  All surfaces  were thoroughly dried.  We mixed cement on the back table in appropriate fashion.  This was then injected into the tibial canal and digitally pressurized.  Cement was placed in the  tibial tray and then impacted onto the tibia.  Redundant cement removed.  We cemented the femur in a similar fashion, impacted it, redundant cement removed.  A trial 6 was then inserted, reduced in extension with an axial load applied throughout the  curing of the cement.  Redundant cement removed and the patella was cemented as well and clamped.  Again, redundant cement removed.  This was copiously irrigated with antibiotic irrigation and covered during the curing of the cement, which at that time,  the tourniquet was deflated and cautery was utilized to achieve hemostasis where needed.  Following this, we examined the knee.  We had good stability, full flexion and full extension.   Removed the trial 6 insert.  Meticulously removed any redundant  cement.  We copiously irrigated the joint.  Again, used the Aquamantys for any active bleeding.  This was minimal.  We then put a 6 permanent insert after irrigation with antibiotic irrigation.  I then reduced the patella and with towel clips we had full  flexion, full extension, good stability with varus valgus flexion at 0 and 30 degrees.  Negative anterior drawer.  One last irrigation and then reapproximated the patellar arthrotomy in flexion with #1 Vicryl interrupted figure-of-eight suture and then  this was oversewn with a running StrataFix.  Next, following this, we had excellent patellofemoral tracking.  Subcutaneous with 2-0 and skin with subcuticular Monocryl.  Sterile dressing applied.  Immobilizer placed, extubated and returned to recovery room in satisfactory condition.  The patient tolerated the procedure well.  No complications.  Minimal blood loss.  Tourniquet time was 60 minutes.  CN/NUANCE  D:02/05/2020 T:02/05/2020 JOB:[id number]

## 2020-02-05 NOTE — Anesthesia Procedure Notes (Signed)
Procedure Name: MAC Date/Time: 02/05/2020 7:22 AM Performed by: Maxwell Caul, CRNA Pre-anesthesia Checklist: Patient identified, Emergency Drugs available, Suction available and Patient being monitored Oxygen Delivery Method: Simple face mask

## 2020-02-06 ENCOUNTER — Encounter (HOSPITAL_COMMUNITY): Payer: Self-pay | Admitting: Specialist

## 2020-02-06 LAB — GLUCOSE, CAPILLARY
Glucose-Capillary: 108 mg/dL — ABNORMAL HIGH (ref 70–99)
Glucose-Capillary: 141 mg/dL — ABNORMAL HIGH (ref 70–99)
Glucose-Capillary: 132 mg/dL — ABNORMAL HIGH (ref 70–99)
Glucose-Capillary: 191 mg/dL — ABNORMAL HIGH (ref 70–99)

## 2020-02-06 LAB — CBC
HCT: 30.6 % — ABNORMAL LOW (ref 36.0–46.0)
Hemoglobin: 8.9 g/dL — ABNORMAL LOW (ref 12.0–15.0)
MCH: 23.1 pg — ABNORMAL LOW (ref 26.0–34.0)
MCHC: 29.1 g/dL — ABNORMAL LOW (ref 30.0–36.0)
MCV: 79.3 fL — ABNORMAL LOW (ref 80.0–100.0)
Platelets: 253 10*3/uL (ref 150–400)
RBC: 3.86 MIL/uL — ABNORMAL LOW (ref 3.87–5.11)
RDW: 16 % — ABNORMAL HIGH (ref 11.5–15.5)
WBC: 7.7 10*3/uL (ref 4.0–10.5)
nRBC: 0 % (ref 0.0–0.2)

## 2020-02-06 LAB — BASIC METABOLIC PANEL
Anion gap: 6 (ref 5–15)
BUN: 23 mg/dL (ref 8–23)
CO2: 28 mmol/L (ref 22–32)
Calcium: 8.2 mg/dL — ABNORMAL LOW (ref 8.9–10.3)
Chloride: 102 mmol/L (ref 98–111)
Creatinine, Ser: 1.36 mg/dL — ABNORMAL HIGH (ref 0.44–1.00)
GFR calc Af Amer: 48 mL/min — ABNORMAL LOW (ref >60)
GFR calc non Af Amer: 41 mL/min — ABNORMAL LOW (ref >60)
Glucose, Bld: 152 mg/dL — ABNORMAL HIGH (ref 70–99)
Potassium: 4.2 mmol/L (ref 3.5–5.1)
Sodium: 136 mmol/L (ref 135–145)

## 2020-02-06 NOTE — Progress Notes (Signed)
Patient ID: [name], female   DOB: 1956/03/02, 65 y.o.   MRN: [id number] Subjective: 1 Day Post-Op Procedure(s) (LRB): TOTAL KNEE ARTHROPLASTY (Left) Patient reports pain as mild.    Patient has complaints of L knee pain.   We will start therapy today. Plan is to go home after hospital stay.  Objective: Vital signs in last 24 hours: Temp:  [97 F (36.1 C)-98.4 F (36.9 C)] 98.3 F (36.8 C) (07/01 0542) Pulse Rate:  [47-69] 69 (07/01 0542) Resp:  [11-20] 17 (07/01 0542) BP: (91-124)/(53-86) 120/86 (07/01 0542) SpO2:  [92 %-99 %] 97 % (07/01 0740)  Intake/Output from previous day:  Intake/Output Summary (Last 24 hours) at 02/06/2020 0818 Last data filed at 02/06/2020 0752 Gross per 24 hour  Intake 3782.32 ml  Output 2475 ml  Net 1307.32 ml    Intake/Output this shift: Total I/O In: 658.1 [I.V.:658.1] Out: -   Labs: Results for orders placed or performed during the hospital encounter of 02/05/20  Glucose, capillary  Result Value Ref Range   Glucose-Capillary 95 70 - 99 mg/dL  Glucose, capillary  Result Value Ref Range   Glucose-Capillary 89 70 - 99 mg/dL   Comment 1 Notify RN   Glucose, capillary  Result Value Ref Range   Glucose-Capillary 69 (L) 70 - 99 mg/dL  Glucose, capillary  Result Value Ref Range   Glucose-Capillary 175 (H) 70 - 99 mg/dL  CBC  Result Value Ref Range   WBC 7.7 4.0 - 10.5 K/uL   RBC 3.86 (L) 3.87 - 5.11 MIL/uL   Hemoglobin 8.9 (L) 12.0 - 15.0 g/dL   HCT 30.6 (L) 36 - 46 %   MCV 79.3 (L) 80.0 - 100.0 fL   MCH 23.1 (L) 26.0 - 34.0 pg   MCHC 29.1 (L) 30.0 - 36.0 g/dL   RDW 16.0 (H) 11.5 - 15.5 %   Platelets 253 150 - 400 K/uL   nRBC 0.0 0.0 - 0.2 %  Basic metabolic panel  Result Value Ref Range   Sodium 136 135 - 145 mmol/L   Potassium 4.2 3.5 - 5.1 mmol/L   Chloride 102 98 - 111 mmol/L   CO2 28 22 - 32 mmol/L   Glucose, Bld 152 (H) 70 - 99 mg/dL   BUN 23 8 - 23 mg/dL   Creatinine, Ser 1.36 (H) 0.44 - 1.00 mg/dL   Calcium 8.2  (L) 8.9 - 10.3 mg/dL   GFR calc non Af Amer 41 (L) >60 mL/min   GFR calc Af Amer 48 (L) >60 mL/min   Anion gap 6 5 - 15  Glucose, capillary  Result Value Ref Range   Glucose-Capillary 168 (H) 70 - 99 mg/dL  Glucose, capillary  Result Value Ref Range   Glucose-Capillary 108 (H) 70 - 99 mg/dL    Exam - Neurologically intact ABD soft Neurovascular intact Sensation intact distally Intact pulses distally Dorsiflexion/Plantar flexion intact Incision: dressing C/D/I and no drainage No cellulitis present Compartment soft no calf pain or sign of DVT Dressing - clean, dry, no drainage Motor function intact - moving foot and toes well on exam.   Assessment/Plan: 1 Day Post-Op Procedure(s) (LRB): TOTAL KNEE ARTHROPLASTY (Left)  Advance diet Up with therapy D/C IV fluids Past Medical History:  Diagnosis Date  . Acute bronchitis   . Anemia    hx of anemia  . Anginal pain ([place])   . Arthritis    osteo  . Asthma   . Coronary atherosclerosis   . Cough   .  GERD (gastroesophageal reflux disease)   . History of colon polyps   . History of migraine   . Hyperlipidemia   . Hypertension   . Obstructive sleep apnea (adult) (pediatric)    NO CPAP   . Peripheral neuropathy   . Type II or unspecified type diabetes mellitus without mention of complication, not stated as uncontrolled    type 2  . Unspecified hypothyroidism     DVT Prophylaxis - ASA Protocol Weight-Bearing as tolerated to Left leg Keep foley until tomorrow. No vaccines Will watch Hgb Possible D/C tomorrow vs Saturday Discussed with Dr. Valentino Saxon Deatra Robinson 02/06/2020, 8:18 AM

## 2020-02-06 NOTE — Progress Notes (Signed)
Physical Therapy Treatment Patient Details [name] MRN: [id number] DOB: 03/03/1956 Today's Date: 02/06/2020    History of Present Illness Patient is 64 y.o. female s/p Lt TKA on 02/05/20 with PMH significant for DM, HTN, HLD, GERD, migraine, asthma, OA, angina, anemia, Lt cardiac cath in 2015, Lt CTR, ACDF to cervical spine.     PT Comments    Pt continues very cooperative but ltd this pm by increased fatigue/lethargy - pt states she received Benadryl for itching which always makes her sleepy.  Will follow on am.  Follow Up Recommendations  Follow surgeons recommendation for DC plan and follow-up therapies;Outpatient PT     Equipment Recommendations  None recommended by PT    Recommendations for Other Services       Precautions / Restrictions Precautions Precautions: Fall Restrictions Weight Bearing Restrictions: No Other Position/Activity Restrictions: WBAT    Mobility  Bed Mobility Overal bed mobility: Needs Assistance Bed Mobility: Supine to Sit;Sit to Supine     Supine to sit: Min guard Sit to supine: Min assist   General bed mobility comments: increased time with assist to bring L LE back onto bed  Transfers Overall transfer level: Needs assistance Equipment used: Rolling walker (2 wheeled) Transfers: Sit to/from Stand Sit to Stand: Min assist         General transfer comment: cues for technique with RW, assist for power up, pt steady with rising. cues for safe reach back to sit in recliner.   Ambulation/Gait Ambulation/Gait assistance: Min assist Gait Distance (Feet): 64 Feet Assistive device: Rolling walker (2 wheeled) Gait Pattern/deviations: Step-to pattern;Decreased stride length;Decreased stance time - left;Decreased weight shift to left;Antalgic Gait velocity: decreased   General Gait Details: Increased time with cues for seqeuence, posture and position from RW.   Stairs             Wheelchair Mobility    Modified  Rankin (Stroke Patients Only)       Balance Overall balance assessment: Needs assistance Sitting-balance support: Feet supported Sitting balance-Leahy Scale: Good     Standing balance support: During functional activity;Bilateral upper extremity supported Standing balance-Leahy Scale: Poor                              Cognition Arousal/Alertness: Lethargic;Suspect due to medications Behavior During Therapy: [facility] for tasks assessed/performed Overall Cognitive Status: Within Functional Limits for tasks assessed                                 General Comments: Pt states given Benedryl for itching which makes her sleepy      Exercises      General Comments        Pertinent Vitals/Pain Pain Assessment: 0-10 Pain Score: 4  Pain Location: Lt knee Pain Descriptors / Indicators: Aching;Discomfort;Sore Pain Intervention(s): Limited activity within patient's tolerance;Monitored during session;Premedicated before session;Ice applied    Home Living                      Prior Function            PT Goals (current goals can now be found in the care plan section) Acute Rehab PT Goals Patient Stated Goal: get back home and to independence PT Goal Formulation: With patient Time For Goal Achievement: 02/12/20 Potential to Achieve Goals: Good Progress towards PT goals: Not progressing toward  goals - comment (Increased fatigue/lethargy - ? meds)    Frequency    7X/week      PT Plan Current plan remains appropriate    Co-evaluation              AM-PAC PT "6 Clicks" Mobility   Outcome Measure  Help needed turning from your back to your side while in a flat bed without using bedrails?: None Help needed moving from lying on your back to sitting on the side of a flat bed without using bedrails?: A Little Help needed moving to and from a bed to a chair (including a wheelchair)?: A Little Help needed standing up from a chair using your  arms (e.g., wheelchair or bedside chair)?: A Little Help needed to walk in hospital room?: A Little Help needed climbing 3-5 steps with a railing? : A Lot 6 Click Score: 18    End of Session Equipment Utilized During Treatment: Gait belt;Left knee immobilizer Activity Tolerance: Patient tolerated treatment well;Patient limited by fatigue Patient left: in bed;with call bell/phone within reach;with family/visitor present Nurse Communication: Mobility status PT Visit Diagnosis: Muscle weakness (generalized) (M62.81);Difficulty in walking, not elsewhere classified (R26.2)     Time: 5183-3582 PT Time Calculation (min) (ACUTE ONLY): 32 min  Charges:  $Gait Training: 23-37 mins                     [place] Pager [phone] Office [phone]    Nicki Gracy 02/06/2020, 3:53 PM

## 2020-02-06 NOTE — Plan of Care (Signed)
  Problem: Health Behavior/Discharge Planning: Goal: Ability to manage health-related needs will improve Outcome: Progressing   Problem: Pain Managment: Goal: General experience of comfort will improve Outcome: Progressing   Problem: Pain Management: Goal: Pain level will decrease with appropriate interventions Outcome: Progressing

## 2020-02-06 NOTE — Plan of Care (Signed)
°  Problem: Health Behavior/Discharge Planning: Goal: Ability to manage health-related needs will improve Outcome: Progressing   Problem: Clinical Measurements: Goal: Ability to maintain clinical measurements within normal limits will improve Outcome: Progressing Goal: Will remain free from infection Outcome: Progressing Goal: Diagnostic test results will improve Outcome: Progressing Goal: Respiratory complications will improve Outcome: Progressing Goal: Cardiovascular complication will be avoided Outcome: Progressing   Problem: Activity: Goal: Risk for activity intolerance will decrease Outcome: Progressing   Problem: Nutrition: Goal: Adequate nutrition will be maintained Outcome: Progressing   Problem: Elimination: Goal: Will not experience complications related to bowel motility Outcome: Progressing Goal: Will not experience complications related to urinary retention Outcome: Progressing   Problem: Pain Managment: Goal: General experience of comfort will improve Outcome: Progressing   Problem: Safety: Goal: Ability to remain free from injury will improve Outcome: Progressing   Problem: Skin Integrity: Goal: Risk for impaired skin integrity will decrease Outcome: Progressing   Problem: Education: Goal: Knowledge of the prescribed therapeutic regimen will improve Outcome: Progressing   Problem: Activity: Goal: Ability to avoid complications of mobility impairment will improve Outcome: Progressing Goal: Range of joint motion will improve Outcome: Progressing   Problem: Clinical Measurements: Goal: Postoperative complications will be avoided or minimized Outcome: Progressing   Problem: Pain Management: Goal: Pain level will decrease with appropriate interventions Outcome: Progressing   Problem: Skin Integrity: Goal: Will show signs of wound healing Outcome: Progressing

## 2020-02-06 NOTE — Progress Notes (Signed)
Physical Therapy Treatment Patient Details [name] MRN: [id number] DOB: 08-12-1955 Today's Date: 02/06/2020    History of Present Illness Patient is 64 y.o. female s/p Lt TKA on 02/05/20 with PMH significant for DM, HTN, HLD, GERD, migraine, asthma, OA, angina, anemia, Lt cardiac cath in 2015, Lt CTR, ACDF to cervical spine.     PT Comments    Pt very motivated and making good progress with mobility.  Pt hopeful for dc home tomorrow.   Follow Up Recommendations  Follow surgeon's recommendation for DC plan and follow-up therapies;Outpatient PT     Equipment Recommendations  None recommended by PT    Recommendations for Other Services       Precautions / Restrictions Precautions Precautions: Fall Restrictions Weight Bearing Restrictions: No Other Position/Activity Restrictions: WBAT    Mobility  Bed Mobility Overal bed mobility: Needs Assistance Bed Mobility: Supine to Sit     Supine to sit: Min guard;HOB elevated     General bed mobility comments: pt utilizing bedrail to self assist  Transfers Overall transfer level: Needs assistance Equipment used: Rolling walker (2 wheeled) Transfers: Sit to/from Stand Sit to Stand: Min assist         General transfer comment: cues for technique with RW, assist for power up, pt steady with rising. cues for safe reach back to sit in recliner.   Ambulation/Gait Ambulation/Gait assistance: Min assist Gait Distance (Feet): 100 Feet Assistive device: Rolling walker (2 wheeled) Gait Pattern/deviations: Step-to pattern;Decreased stride length;Decreased stance time - left;Decreased weight shift to left;Antalgic Gait velocity: decreased   General Gait Details: VC's or safe step pattern and proximty to RW. no overt LOB noted with gait. knee immobilizer to prevent buckling on Lt LE. intermittent assist for walker positioning.    Stairs             Wheelchair Mobility    Modified Rankin (Stroke Patients  Only)       Balance Overall balance assessment: Needs assistance Sitting-balance support: Feet supported Sitting balance-Leahy Scale: Good     Standing balance support: During functional activity;Bilateral upper extremity supported Standing balance-Leahy Scale: Fair                              Cognition Arousal/Alertness: Awake/alert Behavior During Therapy: WFL for tasks assessed/performed Overall Cognitive Status: Within Functional Limits for tasks assessed                                        Exercises Total Joint Exercises Ankle Circles/Pumps: AROM;Both;20 reps;Seated Quad Sets: AROM;Left;10 reps;Seated Heel Slides: AAROM;Left;15 reps;Supine Straight Leg Raises: AAROM;Left;10 reps;Supine Goniometric ROM: AAROM L knee -5 - 40    General Comments        Pertinent Vitals/Pain Pain Assessment: 0-10 Pain Score: 5  Pain Location: Lt knee Pain Descriptors / Indicators: Aching;Discomfort;Sore Pain Intervention(s): Limited activity within patient's tolerance;Monitored during session;Premedicated before session;Ice applied    Home Living                      Prior Function            PT Goals (current goals can now be found in the care plan section) Acute Rehab PT Goals Patient Stated Goal: get back home and to independence PT Goal Formulation: With patient Time For Goal Achievement: 02/12/20 Potential to  Achieve Goals: Good Progress towards PT goals: Progressing toward goals    Frequency    7X/week      PT Plan Current plan remains appropriate    Co-evaluation              AM-PAC PT "6 Clicks" Mobility   Outcome Measure  Help needed turning from your back to your side while in a flat bed without using bedrails?: None Help needed moving from lying on your back to sitting on the side of a flat bed without using bedrails?: A Little Help needed moving to and from a bed to a chair (including a wheelchair)?: A  Little Help needed standing up from a chair using your arms (e.g., wheelchair or bedside chair)?: A Little Help needed to walk in hospital room?: A Little Help needed climbing 3-5 steps with a railing? : A Lot 6 Click Score: 18    End of Session Equipment Utilized During Treatment: Gait belt;Left knee immobilizer Activity Tolerance: Patient tolerated treatment well Patient left: in chair;with call bell/phone within reach;with chair alarm set Nurse Communication: Mobility status PT Visit Diagnosis: Muscle weakness (generalized) (M62.81);Difficulty in walking, not elsewhere classified (R26.2)     Time: 7829-5621 PT Time Calculation (min) (ACUTE ONLY): 40 min  Charges:  $Gait Training: 23-37 mins $Therapeutic Exercise: 8-22 mins                     [place] Pager [phone] Office [phone]    Phila Shoaf 02/06/2020, 9:46 AM

## 2020-02-07 LAB — CBC
HCT: 28.8 % — ABNORMAL LOW (ref 36.0–46.0)
Hemoglobin: 8.7 g/dL — ABNORMAL LOW (ref 12.0–15.0)
MCH: 23.4 pg — ABNORMAL LOW (ref 26.0–34.0)
MCHC: 30.2 g/dL (ref 30.0–36.0)
MCV: 77.4 fL — ABNORMAL LOW (ref 80.0–100.0)
Platelets: 268 10*3/uL (ref 150–400)
RBC: 3.72 MIL/uL — ABNORMAL LOW (ref 3.87–5.11)
RDW: 16.2 % — ABNORMAL HIGH (ref 11.5–15.5)
WBC: 10.6 10*3/uL — ABNORMAL HIGH (ref 4.0–10.5)
nRBC: 0 % (ref 0.0–0.2)

## 2020-02-07 LAB — GLUCOSE, CAPILLARY
Glucose-Capillary: 74 mg/dL (ref 70–99)
Glucose-Capillary: 111 mg/dL — ABNORMAL HIGH (ref 70–99)
Glucose-Capillary: 85 mg/dL (ref 70–99)

## 2020-02-07 MED ORDER — DOCUSATE SODIUM 100 MG PO CAPS: 100.0000 mg | ORAL_CAPSULE | Freq: Two times a day (BID) | ORAL | 0 refills | Status: DC

## 2020-02-07 MED ORDER — OXYCODONE HCL 5 MG PO TABS: 5.0000 mg | ORAL_TABLET | Freq: Four times a day (QID) | ORAL | 0 refills | Status: DC | PRN

## 2020-02-07 MED ORDER — POLYETHYLENE GLYCOL 3350 17 G PO PACK: 17.0000 g | PACK | Freq: Every day | ORAL | 0 refills | Status: DC | PRN

## 2020-02-07 MED ORDER — ASPIRIN 81 MG PO CHEW: 81.0000 mg | CHEWABLE_TABLET | Freq: Two times a day (BID) | ORAL | 0 refills | Status: DC

## 2020-02-07 NOTE — Progress Notes (Signed)
Physical Therapy Treatment Patient Details [name] MRN: [id number] DOB: 06-Sep-1955 Today's Date: 02/07/2020    History of Present Illness Patient is 64 y.o. female s/p Lt TKA on 02/05/20 with PMH significant for DM, HTN, HLD, GERD, migraine, asthma, OA, angina, anemia, Lt cardiac cath in 2015, Lt CTR, ACDF to cervical spine.     PT Comments    Pt with marked improvement in activity tolerance with improved pain control.  This pm, pt up to bathroom for toileting and hygiene at sink, ambulated increased distance in hall, performed HEP with assist and navigated stairs.  Pt sister present for family ed and written instructions provided and reviewed.  Follow Up Recommendations  Follow surgeon's recommendation for DC plan and follow-up therapies;Outpatient PT     Equipment Recommendations  None recommended by PT    Recommendations for Other Services       Precautions / Restrictions Precautions Precautions: Fall Restrictions Weight Bearing Restrictions: No Other Position/Activity Restrictions: WBAT    Mobility  Bed Mobility Overal bed mobility: Needs Assistance Bed Mobility: Supine to Sit;Sit to Supine     Supine to sit: Min guard;Supervision Sit to supine: Min guard   General bed mobility comments: Cues for sequence, pt using UEs to self assist L LE  Transfers Overall transfer level: Needs assistance Equipment used: Rolling walker (2 wheeled) Transfers: Sit to/from Stand Sit to Stand: Min guard;Supervision         General transfer comment: cues for LE management and use of UEs to self assist  Ambulation/Gait Ambulation/Gait assistance: Min guard;Supervision Gait Distance (Feet): 75 Feet (and additional 15' twice to/from bathroom) Assistive device: Rolling walker (2 wheeled) Gait Pattern/deviations: Step-to pattern;Decreased stride length;Decreased stance time - left;Decreased weight shift to left;Antalgic Gait velocity: decreased   General Gait  Details: Increased time with cues for seqeuence, posture and position from RW.   Stairs Stairs: Yes Stairs assistance: Min assist Stair Management: Two rails;Step to pattern;Forwards Number of Stairs: 5 General stair comments: cues for sequence   Wheelchair Mobility    Modified Rankin (Stroke Patients Only)       Balance Overall balance assessment: Needs assistance Sitting-balance support: Feet supported Sitting balance-Leahy Scale: Good     Standing balance support: During functional activity;Bilateral upper extremity supported Standing balance-Leahy Scale: Fair                              Cognition Arousal/Alertness: Awake/alert Behavior During Therapy: WFL for tasks assessed/performed Overall Cognitive Status: Within Functional Limits for tasks assessed                                        Exercises Total Joint Exercises Ankle Circles/Pumps: AROM;Both;20 reps;Seated Quad Sets: AROM;Left;10 reps;Seated Heel Slides: AAROM;Left;15 reps;Supine Straight Leg Raises: AAROM;Left;10 reps;Supine    General Comments        Pertinent Vitals/Pain Pain Assessment: 0-10 Pain Score: 4  Pain Location: Lt knee Pain Descriptors / Indicators: Aching;Discomfort;Sore Pain Intervention(s): Limited activity within patient's tolerance;Monitored during session;Premedicated before session;Ice applied    Home Living                      Prior Function            PT Goals (current goals can now be found in the care plan section) Acute Rehab PT Goals  Patient Stated Goal: get back home and to independence PT Goal Formulation: With patient Time For Goal Achievement: 02/12/20 Potential to Achieve Goals: Good Progress towards PT goals: Progressing toward goals    Frequency    7X/week      PT Plan Current plan remains appropriate    Co-evaluation              AM-PAC PT "6 Clicks" Mobility   Outcome Measure  Help needed  turning from your back to your side while in a flat bed without using bedrails?: None Help needed moving from lying on your back to sitting on the side of a flat bed without using bedrails?: A Little Help needed moving to and from a bed to a chair (including a wheelchair)?: A Little Help needed standing up from a chair using your arms (e.g., wheelchair or bedside chair)?: A Little Help needed to walk in hospital room?: A Little Help needed climbing 3-5 steps with a railing? : A Little 6 Click Score: 19    End of Session Equipment Utilized During Treatment: Gait belt;Left knee immobilizer Activity Tolerance: Patient tolerated treatment well Patient left: in bed;with call bell/phone within reach;with family/visitor present Nurse Communication: Mobility status PT Visit Diagnosis: Muscle weakness (generalized) (M62.81);Difficulty in walking, not elsewhere classified (R26.2)     Time: 5361-4431 PT Time Calculation (min) (ACUTE ONLY): 56 min  Charges:  $Gait Training: 23-37 mins $Therapeutic Exercise: 8-22 mins $Therapeutic Activity: 8-22 mins                     [place] Pager [phone] Office [phone]    Cherye Gaertner 02/07/2020, 4:46 PM

## 2020-02-07 NOTE — Progress Notes (Signed)
Physical Therapy Treatment Patient Details [name] MRN: [id number] DOB: January 13, 1956 Today's Date: 02/07/2020    History of Present Illness Patient is 64 y.o. female s/p Lt TKA on 02/05/20 with PMH significant for DM, HTN, HLD, GERD, migraine, asthma, OA, angina, anemia, Lt cardiac cath in 2015, Lt CTR, ACDF to cervical spine.     PT Comments    Pt continues cooperative but requiring increased time for all tasks and limited by increased pain in operative and non-operative knees.   Follow Up Recommendations  Follow surgeons recommendation for DC plan and follow-up therapies;Outpatient PT     Equipment Recommendations  None recommended by PT    Recommendations for Other Services       Precautions / Restrictions Precautions Precautions: Fall Restrictions Weight Bearing Restrictions: No Other Position/Activity Restrictions: WBAT    Mobility  Bed Mobility Overal bed mobility: Needs Assistance Bed Mobility: Supine to Sit     Supine to sit: Min assist     General bed mobility comments: increased time with assist to manage L LE   Transfers Overall transfer level: Needs assistance Equipment used: Rolling walker (2 wheeled) Transfers: Sit to/from Stand Sit to Stand: Min assist         General transfer comment: cues for technique with RW, assist for power up, pt steady with rising. cues for safe reach back to sit in recliner.   Ambulation/Gait Ambulation/Gait assistance: Min assist;Min guard Gait Distance (Feet): 56 Feet Assistive device: Rolling walker (2 wheeled) Gait Pattern/deviations: Step-to pattern;Decreased stride length;Decreased stance time - left;Decreased weight shift to left;Antalgic Gait velocity: decreased   General Gait Details: Increased time with cues for seqeuence, posture and position from RW.   Stairs             Wheelchair Mobility    Modified Rankin (Stroke Patients Only)       Balance Overall balance assessment:  Needs assistance Sitting-balance support: Feet supported Sitting balance-Leahy Scale: Good     Standing balance support: During functional activity;Bilateral upper extremity supported Standing balance-Leahy Scale: Poor                              Cognition Arousal/Alertness: Awake/alert Behavior During Therapy: WFL for tasks assessed/performed Overall Cognitive Status: Within Functional Limits for tasks assessed                                        Exercises Total Joint Exercises Ankle Circles/Pumps: AROM;Both;20 reps;Seated Quad Sets: AROM;Left;10 reps;Seated Heel Slides: AAROM;Left;15 reps;Supine Straight Leg Raises: AAROM;Left;10 reps;Supine Goniometric ROM: AAROM L knee -5 - 30    General Comments        Pertinent Vitals/Pain Pain Assessment: 0-10 Pain Score: 6  Pain Location: Lt knee Pain Descriptors / Indicators: Aching;Discomfort;Sore Pain Intervention(s): Limited activity within patient's tolerance;Monitored during session;Premedicated before session;Ice applied    Home Living                      Prior Function            PT Goals (current goals can now be found in the care plan section) Acute Rehab PT Goals Patient Stated Goal: get back home and to independence PT Goal Formulation: With patient Time For Goal Achievement: 02/12/20 Potential to Achieve Goals: Good Progress towards PT goals: Not progressing toward goals -  comment (Ltd by increased pain bil knees)    Frequency    7X/week      PT Plan Current plan remains appropriate    Co-evaluation              AM-PAC PT "6 Clicks" Mobility   Outcome Measure  Help needed turning from your back to your side while in a flat bed without using bedrails?: None Help needed moving from lying on your back to sitting on the side of a flat bed without using bedrails?: A Little Help needed moving to and from a bed to a chair (including a wheelchair)?: A  Little Help needed standing up from a chair using your arms (e.g., wheelchair or bedside chair)?: A Little Help needed to walk in hospital room?: A Little Help needed climbing 3-5 steps with a railing? : A Lot 6 Click Score: 18    End of Session Equipment Utilized During Treatment: Gait belt;Left knee immobilizer Activity Tolerance: Patient tolerated treatment well;Patient limited by fatigue Patient left: in chair;with call bell/phone within reach;with chair alarm set Nurse Communication: Mobility status PT Visit Diagnosis: Muscle weakness (generalized) (M62.81);Difficulty in walking, not elsewhere classified (R26.2)     Time: 4696-2952 PT Time Calculation (min) (ACUTE ONLY): 48 min  Charges:  $Gait Training: 23-37 mins $Therapeutic Exercise: 8-22 mins                     Debe Coder PT Acute Rehabilitation Services Pager [phone] Office [phone]    Larrie Lucia 02/07/2020, 12:05 PM

## 2020-02-07 NOTE — Plan of Care (Signed)
  Problem: Pain Managment: Goal: General experience of comfort will improve Outcome: Progressing   

## 2020-02-07 NOTE — TOC Transition Note (Signed)
Transition of Care [facility]) - CM/SW Discharge Note   Patient Details  [name] MRN: [id number] Date of Birth: March 04, 1956  Transition of Care [facility] CM/SW Contact:  Lia Hopping, [place] Phone Number: 02/07/2020, 12:12 PM   Clinical Narrative:    [place] ([facility]) arranged for HHPT.  Patient confirm she has RW and 3 in1   Final next level of care: [place] Barriers to Discharge: No Barriers Identified   Patient Goals and CMS Choice Patient states their goals for this hospitalization and ongoing recovery are:: rehab at home CMS Medicare.gov Compare Post Acute Care list provided to:: Patient Choice offered to / list presented to : Patient  Discharge Placement                       Discharge Plan and Services                DME Arranged: N/A DME Agency: NA       HH Arranged: PT [place] Agency: [place] ([place]) Date [place]: 02/07/20 Time [place]: 2712 Representative spoke with at [place]: [place] ([place]) Interventions     Readmission Risk Interventions No flowsheet data found.

## 2020-02-07 NOTE — Discharge Summary (Signed)
Physician Discharge Summary   Patient ID: [name] MRN: [id number] DOB/AGE: July 01, 1956 64 y.o.  Admit date: 02/05/2020 Discharge date:   Primary Diagnosis: Left knee primary osteoarthritis  Admission Diagnoses:  Past Medical History:  Diagnosis Date  . Acute bronchitis   . Anemia    hx of anemia  . Anginal pain ([place])   . Arthritis    osteo  . Asthma   . Coronary atherosclerosis   . Cough   . GERD (gastroesophageal reflux disease)   . History of colon polyps   . History of migraine   . Hyperlipidemia   . Hypertension   . Obstructive sleep apnea (adult) (pediatric)    NO CPAP   . Peripheral neuropathy   . Type II or unspecified type diabetes mellitus without mention of complication, not stated as uncontrolled    type 2  . Unspecified hypothyroidism    Discharge Diagnoses:   Active Problems:   Left knee DJD  Estimated body mass index is 33.95 kg/m as calculated from the following:   Height as of this encounter: 5\' 2"  (1.575 m).   Weight as of this encounter: 84.2 kg.  Procedure:  Procedure(s) (LRB): TOTAL KNEE ARTHROPLASTY (Left)   Consults: None  HPI: see H&P Laboratory Data: Admission on 02/05/2020  Component Date Value Ref Range Status  . Glucose-Capillary 02/05/2020 95  70 - 99 mg/dL Final   Glucose reference range applies only to samples taken after fasting for at least 8 hours.  . Glucose-Capillary 02/05/2020 89  70 - 99 mg/dL Final   Glucose reference range applies only to samples taken after fasting for at least 8 hours.  . Comment 1 02/05/2020 Notify RN   Final  . Glucose-Capillary 02/05/2020 69* 70 - 99 mg/dL Final   Glucose reference range applies only to samples taken after fasting for at least 8 hours.  . Glucose-Capillary 02/05/2020 175* 70 - 99 mg/dL Final   Glucose reference range applies only to samples taken after fasting for at least 8 hours.  . WBC 02/06/2020 7.7  4.0 - 10.5 K/uL Final  . RBC 02/06/2020 3.86* 3.87 - 5.11 MIL/uL  Final  . Hemoglobin 02/06/2020 8.9* 12.0 - 15.0 g/dL Final  . HCT 02/06/2020 30.6* 36 - 46 % Final  . MCV 02/06/2020 79.3* 80.0 - 100.0 fL Final  . MCH 02/06/2020 23.1* 26.0 - 34.0 pg Final  . MCHC 02/06/2020 29.1* 30.0 - 36.0 g/dL Final  . RDW 02/06/2020 16.0* 11.5 - 15.5 % Final  . Platelets 02/06/2020 253  150 - 400 K/uL Final  . nRBC 02/06/2020 0.0  0.0 - 0.2 % Final   Performed at [facility], [place] [address] [place], [place] [address]  . Sodium 02/06/2020 136  135 - 145 mmol/L Final  . Potassium 02/06/2020 4.2  3.5 - 5.1 mmol/L Final  . Chloride 02/06/2020 102  98 - 111 mmol/L Final  . CO2 02/06/2020 28  22 - 32 mmol/L Final  . Glucose, Bld 02/06/2020 152* 70 - 99 mg/dL Final   Glucose reference range applies only to samples taken after fasting for at least 8 hours.  . BUN 02/06/2020 23  8 - 23 mg/dL Final  . Creatinine, Ser 02/06/2020 1.36* 0.44 - 1.00 mg/dL Final  . Calcium 02/06/2020 8.2* 8.9 - 10.3 mg/dL Final  . GFR calc non Af Amer 02/06/2020 41* >60 mL/min Final  . GFR calc Af Amer 02/06/2020 48* >60 mL/min Final  . Anion gap 02/06/2020 6  5 - 15 Final   Performed at [facility], [place] [address], [place], [place] [address]  . Glucose-Capillary 02/05/2020 168* 70 - 99 mg/dL Final   Glucose reference range applies only to samples taken after fasting for at least 8 hours.  . Glucose-Capillary 02/06/2020 108* 70 - 99 mg/dL Final   Glucose reference range applies only to samples taken after fasting for at least 8 hours.  . Glucose-Capillary 02/06/2020 141* 70 - 99 mg/dL Final   Glucose reference range applies only to samples taken after fasting for at least 8 hours.  . WBC 02/07/2020 10.6* 4.0 - 10.5 K/uL Final  . RBC 02/07/2020 3.72* 3.87 - 5.11 MIL/uL Final  . Hemoglobin 02/07/2020 8.7* 12.0 - 15.0 g/dL Final   Comment: Reticulocyte Hemoglobin testing may be clinically indicated, consider ordering this additional test [id number]   .  HCT 02/07/2020 28.8* 36 - 46 % Final  . MCV 02/07/2020 77.4* 80.0 - 100.0 fL Final  . MCH 02/07/2020 23.4* 26.0 - 34.0 pg Final  . MCHC 02/07/2020 30.2  30.0 - 36.0 g/dL Final  . RDW 02/07/2020 16.2* 11.5 - 15.5 % Final  . Platelets 02/07/2020 268  150 - 400 K/uL Final  . nRBC 02/07/2020 0.0  0.0 - 0.2 % Final   Performed at [facility], [place] [address] [place], [place] [address]  . Glucose-Capillary 02/06/2020 132* 70 - 99 mg/dL Final   Glucose reference range applies only to samples taken after fasting for at least 8 hours.  . Glucose-Capillary 02/06/2020 191* 70 - 99 mg/dL Final   Glucose reference range applies only to samples taken after fasting for at least 8 hours.  . Glucose-Capillary 02/07/2020 74  70 - 99 mg/dL Final   Glucose reference range applies only to samples taken after fasting for at least 8 hours.  Hospital Outpatient Visit on 02/04/2020  Component Date Value Ref Range Status  . MRSA, PCR 02/04/2020 NEGATIVE  NEGATIVE Final  . Staphylococcus aureus 02/04/2020 NEGATIVE  NEGATIVE Final   Comment: (NOTE) The Xpert SA Assay (FDA approved for NASAL specimens in patients 64 years of age and older), is one component of a comprehensive surveillance program. It is not intended to diagnose infection nor to guide or monitor treatment. Performed at [facility], [place] [address], [place], [place] [address]   . Sodium 02/04/2020 134* 135 - 145 mmol/L Final  . Potassium 02/04/2020 3.5  3.5 - 5.1 mmol/L Final  . Chloride 02/04/2020 98  98 - 111 mmol/L Final  . CO2 02/04/2020 29  22 - 32 mmol/L Final  . Glucose, Bld 02/04/2020 130* 70 - 99 mg/dL Final   Glucose reference range applies only to samples taken after fasting for at least 8 hours.  . BUN 02/04/2020 28* 8 - 23 mg/dL Final  . Creatinine, Ser 02/04/2020 1.40* 0.44 - 1.00 mg/dL Final  . Calcium 02/04/2020 8.3* 8.9 - 10.3 mg/dL Final  . GFR calc non Af Amer 02/04/2020 40* >60 mL/min  Final  . GFR calc Af Amer 02/04/2020 46* >60 mL/min Final  . Anion gap 02/04/2020 7  5 - 15 Final   Performed at [facility], [place] [address], [place], [place] [address]  . WBC 02/04/2020 6.5  4.0 - 10.5 K/uL Final  . RBC 02/04/2020 4.43  3.87 - 5.11 MIL/uL Final  . Hemoglobin 02/04/2020 10.4* 12.0 - 15.0 g/dL Final  . HCT 02/04/2020 34.8* 36 - 46 % Final  .  MCV 02/04/2020 78.6* 80.0 - 100.0 fL Final  . MCH 02/04/2020 23.5* 26.0 - 34.0 pg Final  . MCHC 02/04/2020 29.9* 30.0 - 36.0 g/dL Final  . RDW 02/04/2020 16.2* 11.5 - 15.5 % Final  . Platelets 02/04/2020 303  150 - 400 K/uL Final  . nRBC 02/04/2020 0.0  0.0 - 0.2 % Final   Performed at [facility], [place] [address] [place], [place] [address]  . Glucose-Capillary 02/04/2020 130* 70 - 99 mg/dL Final   Glucose reference range applies only to samples taken after fasting for at least 8 hours.  Hospital Outpatient Visit on 02/01/2020  Component Date Value Ref Range Status  . SARS Coronavirus 2 02/01/2020 NEGATIVE  NEGATIVE Final   Comment: (NOTE) SARS-CoV-2 target nucleic acids are NOT DETECTED.  The SARS-CoV-2 RNA is generally detectable in upper and lower respiratory specimens during the acute phase of infection. Negative results do not preclude SARS-CoV-2 infection, do not rule out co-infections with other pathogens, and should not be used as the sole basis for treatment or other patient management decisions. Negative results must be combined with clinical observations, patient history, and epidemiological information. The expected result is Negative.  Fact Sheet for Patients: SugarRoll.be  Fact Sheet for Healthcare Providers: https://www.woods-mathews.com/  This test is not yet approved or cleared by the [place] FDA and  has been authorized for detection and/or diagnosis of SARS-CoV-2 by FDA under an Emergency Use Authorization (EUA). This  EUA will remain  in effect (meaning this test can be used) for the duration of the COVID-19 declaration under Se                          ction 564(b)(1) of the Act, 21 U.S.C. section 360bbb-3(b)(1), unless the authorization is terminated or revoked sooner.  Performed at [facility], [place] [address] [place], [place] [address]   Hospital Outpatient Visit on 01/13/2020  Component Date Value Ref Range Status  . SARS Coronavirus 2 01/13/2020 NEGATIVE  NEGATIVE Final   Comment: (NOTE) SARS-CoV-2 target nucleic acids are NOT DETECTED. The SARS-CoV-2 RNA is generally detectable in upper and lower respiratory specimens during the acute phase of infection. Negative results do not preclude SARS-CoV-2 infection, do not rule out co-infections with other pathogens, and should not be used as the sole basis for treatment or other patient management decisions. Negative results must be combined with clinical observations, patient history, and epidemiological information. The expected result is Negative. Fact Sheet for Patients: SugarRoll.be Fact Sheet for Healthcare Providers: https://www.woods-mathews.com/ This test is not yet approved or cleared by the [place] FDA and  has been authorized for detection and/or diagnosis of SARS-CoV-2 by FDA under an Emergency Use Authorization (EUA). This EUA will remain  in effect (meaning this test can be used) for the duration of the COVID-19 declaration under Section 56                          4(b)(1) of the Act, 21 U.S.C. section 360bbb-3(b)(1), unless the authorization is terminated or revoked sooner. Performed at [facility], [place] [address], [place],  [address]   Hospital Outpatient Visit on 01/07/2020  Component Date Value Ref Range Status  . MRSA, PCR 01/07/2020 NEGATIVE  NEGATIVE Final  . Staphylococcus aureus 01/07/2020 NEGATIVE  NEGATIVE Final   Comment: (NOTE) The Xpert SA  Assay (FDA approved for NASAL specimens in patients 22  years of age and older), is one component of a comprehensive surveillance program. It is not intended to diagnose infection nor to guide or monitor treatment. Performed at [facility], [place] [address] [place], [place] [address]   . aPTT 01/07/2020 28  24 - 36 seconds Final   Performed at [facility], [place] [address], [place], [place] [address]  . Sodium 01/07/2020 141  135 - 145 mmol/L Final  . Potassium 01/07/2020 3.9  3.5 - 5.1 mmol/L Final  . Chloride 01/07/2020 106  98 - 111 mmol/L Final  . CO2 01/07/2020 24  22 - 32 mmol/L Final  . Glucose, Bld 01/07/2020 148* 70 - 99 mg/dL Final   Glucose reference range applies only to samples taken after fasting for at least 8 hours.  . BUN 01/07/2020 27* 8 - 23 mg/dL Final  . Creatinine, Ser 01/07/2020 1.44* 0.44 - 1.00 mg/dL Final  . Calcium 01/07/2020 8.4* 8.9 - 10.3 mg/dL Final  . GFR calc non Af Amer 01/07/2020 38* >60 mL/min Final  . GFR calc Af Amer 01/07/2020 44* >60 mL/min Final  . Anion gap 01/07/2020 11  5 - 15 Final   Performed at [facility], [place] [address] [place], [place] [address]  . WBC 01/07/2020 7.1  4.0 - 10.5 K/uL Final  . RBC 01/07/2020 4.39  3.87 - 5.11 MIL/uL Final  . Hemoglobin 01/07/2020 10.4* 12.0 - 15.0 g/dL Final  . HCT 01/07/2020 35.0* 36 - 46 % Final  . MCV 01/07/2020 79.7* 80.0 - 100.0 fL Final  . MCH 01/07/2020 23.7* 26.0 - 34.0 pg Final  . MCHC 01/07/2020 29.7* 30.0 - 36.0 g/dL Final  . RDW 01/07/2020 15.9* 11.5 - 15.5 % Final  . Platelets 01/07/2020 290  150 - 400 K/uL Final  . nRBC 01/07/2020 0.0  0.0 - 0.2 % Final   Performed at [facility], [place] [address] [place], [place] [address]  . Prothrombin Time 01/07/2020 13.1  11.4 - 15.2 seconds Final  . INR 01/07/2020 1.0  0.8 - 1.2 Final   Comment: (NOTE) INR goal varies based on device and disease states. Performed at  [facility], [place] [address] [place], [place] [address]   . Color, Urine 01/07/2020 YELLOW  YELLOW Final  . APPearance 01/07/2020 CLEAR  CLEAR Final  . Specific Gravity, Urine 01/07/2020 1.013  1.005 - 1.030 Final  . pH 01/07/2020 5.0  5.0 - 8.0 Final  . Glucose, UA 01/07/2020 NEGATIVE  NEGATIVE mg/dL Final  . Hgb urine dipstick 01/07/2020 NEGATIVE  NEGATIVE Final  . Bilirubin Urine 01/07/2020 NEGATIVE  NEGATIVE Final  . Ketones, ur 01/07/2020 NEGATIVE  NEGATIVE mg/dL Final  . Protein, ur 01/07/2020 NEGATIVE  NEGATIVE mg/dL Final  . Nitrite 01/07/2020 NEGATIVE  NEGATIVE Final  . [place] Guest 01/07/2020 NEGATIVE  NEGATIVE Final  . RBC / HPF 01/07/2020 0-5  0 - 5 RBC/hpf Final  . WBC, UA 01/07/2020 0-5  0 - 5 WBC/hpf Final  . Bacteria, UA 01/07/2020 NONE SEEN  NONE SEEN Final  . Squamous Epithelial / LPF 01/07/2020 0-5  0 - 5 Final   Performed at [facility], [place] [address], [place], [place] [address]  . Hgb A1c MFr Bld 01/07/2020 7.3* 4.8 - 5.6 % Final   Comment: (NOTE) Pre diabetes:          5.7%-6.4% Diabetes:              >6.4% Glycemic  control for   <7.0% adults with diabetes   . Mean Plasma Glucose 01/07/2020 162.81  mg/dL Final   Performed at [place] [address] [place], [place] [address]  . Glucose-Capillary 01/07/2020 147* 70 - 99 mg/dL Final   Glucose reference range applies only to samples taken after fasting for at least 8 hours.     X-Rays:DG Knee 2 Views Left  Result Date: 02/05/2020 CLINICAL DATA:  Total knee replacement EXAM: LEFT KNEE - 1-2 VIEW COMPARISON:  None. FINDINGS: Total knee arthroplasty that is well seated. No subluxation. No acute fracture. IMPRESSION: Total knee arthroplasty without complicating feature. Electronically Signed   By: Monte Fantasia M.D.   On: 02/05/2020 10:44    EKG: Orders placed or performed in visit on 07/10/19  . EKG 12-Lead     Hospital Course: HEVIN JEFFCOAT is a 64  y.o. who was admitted to [facility]. They were brought to the operating room on 02/05/2020 and underwent Procedure(s): TOTAL KNEE ARTHROPLASTY.  Patient tolerated the procedure well and was later transferred to the recovery room and then to the orthopaedic floor for postoperative care.  They were given PO and IV analgesics for pain control following their surgery.  They were given 24 hours of postoperative antibiotics of  Anti-infectives (From admission, onward)   Start     Dose/Rate Route Frequency Ordered Stop   02/05/20 1400  ceFAZolin (ANCEF) IVPB 2g/100 mL premix        2 g 200 mL/hr over 30 Minutes Intravenous Every 6 hours 02/05/20 1126 02/06/20 0346   02/05/20 0817  polymyxin B 500,000 Units, bacitracin 50,000 Units in sodium chloride 0.9 % 500 mL irrigation  Status:  Discontinued          As needed 02/05/20 0818 02/05/20 1125   02/05/20 0600  ceFAZolin (ANCEF) IVPB 2g/100 mL premix        2 g 200 mL/hr over 30 Minutes Intravenous On call to O.R. 02/05/20 0532 02/05/20 0745     and started on DVT prophylaxis in the form of Aspirin, TED hose and SCDs.   PT and OT were ordered for total joint protocol.  Discharge planning consulted to help with postop disposition and equipment needs.  Patient had a good night on the evening of surgery.  They started to get up OOB with therapy on day one.  Continued to work with therapy into day two.  By day two, the patient had progressed with therapy and meeting their goals.  Incision was healing well.  Patient was seen in rounds and was ready to go home.   Diet: Diabetic diet Activity:WBAT Follow-up:in 14 days Disposition - Home with HHPT Discharged Condition: good    Allergies as of 02/07/2020      Reactions   Codeine Other (See Comments)   REACTION: hallucinations/"loopy"   Levaquin [levofloxacin] Nausea And Vomiting   Sulfa Antibiotics Hives   Sulfonamide Derivatives Hives      Medication List    STOP taking these medications     amoxicillin-clavulanate 875-125 MG tablet Commonly known as: AUGMENTIN   aspirin EC 81 MG tablet Replaced by: aspirin 81 MG chewable tablet   clopidogrel 75 MG tablet Commonly known as: PLAVIX     TAKE these medications   albuterol 108 (90 Base) MCG/ACT inhaler Commonly known as: Proventil HFA Inhale 2 puffs into the lungs every 6 (six) hours as needed for wheezing or shortness of breath. For shortness of breath and wheezing  aspirin 81 MG chewable tablet Chew 1 tablet (81 mg total) by mouth 2 (two) times daily. Replaces: aspirin EC 81 MG tablet   diclofenac 75 MG EC tablet Commonly known as: VOLTAREN Take 75 mg by mouth 2 (two) times daily.   docusate sodium 100 MG capsule Commonly known as: COLACE Take 1 capsule (100 mg total) by mouth 2 (two) times daily.   fluticasone 50 MCG/ACT nasal spray Commonly known as: Flonase Place 2 sprays into both nostrils daily. What changed:   when to take this  reasons to take this   Flutter Devi 1 puff by Does not apply route daily.   furosemide 40 MG tablet Commonly known as: LASIX Take 1 tablet (40 mg total) by mouth daily as needed for fluid. What changed: additional instructions   insulin glargine 100 UNIT/ML injection Commonly known as: LANTUS Inject 40 Units into the skin at bedtime.   insulin lispro 100 UNIT/ML injection Commonly known as: HUMALOG Inject 0-15 Units into the skin 3 (three) times daily as needed for high blood sugar. Sliding Scale Insulin (5 units for blood sugars greater than 200, 10 units for blood sugars greater than 300)   ipratropium-albuterol 0.5-2.5 (3) MG/3ML Soln Commonly known as: DUONEB Take 3 mLs by nebulization every 4 (four) hours as needed. What changed: reasons to take this   isosorbide mononitrate 60 MG 24 hr tablet Commonly known as: IMDUR Take 1 tablet (60 mg total) by mouth daily.   levothyroxine 100 MCG tablet Commonly known as: SYNTHROID Take 100 mcg by mouth daily before  breakfast.   mepolizumab 100 MG/ML Sosy Commonly known as: NUCALA Inject 100 mg into the skin every 30 (thirty) days.   metoprolol succinate 25 MG 24 hr tablet Commonly known as: TOPROL-XL TAKE 1 AND 1/2 TABLET BY MOUTH TWICE A DAY What changed:   how much to take  how to take this  when to take this  additional instructions   montelukast 10 MG tablet Commonly known as: SINGULAIR Take 1 tablet (10 mg total) by mouth at bedtime.   NovoTwist 32G X 5 MM Misc Generic drug: Insulin Pen Needle Use as directed   omega-3 acid ethyl esters 1 g capsule Commonly known as: LOVAZA TAKE TWO CAPSULES BY MOUTH TWO TIMES A DAY   ONE TOUCH ULTRA TEST test strip Generic drug: glucose blood Use as directed   oxyCODONE 5 MG immediate release tablet Commonly known as: Oxy IR/ROXICODONE Take 1-2 tablets (5-10 mg total) by mouth every 6 (six) hours as needed for moderate pain (pain score 4-6).   polyethylene glycol 17 g packet Commonly known as: MIRALAX / GLYCOLAX Take 17 g by mouth daily as needed for mild constipation.   pregabalin 100 MG capsule Commonly known as: LYRICA Take 100 mg by mouth 2 (two) times daily.   rosuvastatin 5 MG tablet Commonly known as: CRESTOR Take 1 tablet (5 mg total) by mouth daily.   Systane Nighttime Oint Apply 1 application to eye at bedtime.   Theratears 0.25 % Soln Generic drug: Carboxymethylcellulose Sodium Place 1 drop into both eyes 3 (three) times daily as needed (dry/irritated eyes.).   Trelegy Ellipta 100-62.5-25 MCG/INH Aepb Generic drug: Fluticasone-Umeclidin-Vilant Inhale 1 puff into the lungs daily.   Trelegy Ellipta 200-62.5-25 MCG/INH Aepb Generic drug: Fluticasone-Umeclidin-Vilant Inhale 1 puff into the lungs daily.   triamterene-hydrochlorothiazide 37.5-25 MG tablet Commonly known as: MAXZIDE-25 Take 1 tablet by mouth daily.   verapamil 120 MG CR tablet Commonly known as: CALAN-SR Take  1 tablet (120 mg total) by mouth at  bedtime.   Victoza 18 MG/3ML Sopn Generic drug: liraglutide Inject 1.8 mg into the skin every evening.       Follow-up Information    Susa Day, MD Follow up in 2 week(s).   Specialty: Orthopedic Surgery Contact information: [address] [place] [address] [phone]               Signed: Lacie Draft, PA-C Orthopaedic Surgery 02/07/2020, 8:33 AM

## 2020-02-07 NOTE — Progress Notes (Signed)
Patient ID: [name], female   DOB: [birth year], 64 y.o.   MRN: [id number] Subjective: 2 Days Post-Op Procedure(s) (LRB): TOTAL KNEE ARTHROPLASTY (Left) Patient reports pain as moderate.    Patient has complaints of L knee and thigh pain.  Objective: Vital signs in last 24 hours: Temp:  [98.1 F (36.7 C)-100.8 F (38.2 C)] 100 F (37.8 C) (07/02 0528) Pulse Rate:  [77-99] 77 (07/02 0528) Resp:  [13-18] 18 (07/02 0528) BP: (113-129)/(64-78) 121/64 (07/02 0528) SpO2:  [92 %-95 %] 94 % (07/02 0528)  Intake/Output from previous day:  Intake/Output Summary (Last 24 hours) at 02/07/2020 8588 Last data filed at 02/07/2020 0815 Gross per 24 hour  Intake 520 ml  Output 1350 ml  Net -830 ml    Intake/Output this shift: Total I/O In: -  Out: 500 [Urine:500]  Labs: Results for orders placed or performed during the hospital encounter of 02/05/20  Glucose, capillary  Result Value Ref Range   Glucose-Capillary 95 70 - 99 mg/dL  Glucose, capillary  Result Value Ref Range   Glucose-Capillary 89 70 - 99 mg/dL   Comment 1 Notify RN   Glucose, capillary  Result Value Ref Range   Glucose-Capillary 69 (L) 70 - 99 mg/dL  Glucose, capillary  Result Value Ref Range   Glucose-Capillary 175 (H) 70 - 99 mg/dL  CBC  Result Value Ref Range   WBC 7.7 4.0 - 10.5 K/uL   RBC 3.86 (L) 3.87 - 5.11 MIL/uL   Hemoglobin 8.9 (L) 12.0 - 15.0 g/dL   HCT 30.6 (L) 36 - 46 %   MCV 79.3 (L) 80.0 - 100.0 fL   MCH 23.1 (L) 26.0 - 34.0 pg   MCHC 29.1 (L) 30.0 - 36.0 g/dL   RDW 16.0 (H) 11.5 - 15.5 %   Platelets 253 150 - 400 K/uL   nRBC 0.0 0.0 - 0.2 %  Basic metabolic panel  Result Value Ref Range   Sodium 136 135 - 145 mmol/L   Potassium 4.2 3.5 - 5.1 mmol/L   Chloride 102 98 - 111 mmol/L   CO2 28 22 - 32 mmol/L   Glucose, Bld 152 (H) 70 - 99 mg/dL   BUN 23 8 - 23 mg/dL   Creatinine, Ser 1.36 (H) 0.44 - 1.00 mg/dL   Calcium 8.2 (L) 8.9 - 10.3 mg/dL   GFR calc non Af Amer 41 (L) >60 mL/min    GFR calc Af Amer 48 (L) >60 mL/min   Anion gap 6 5 - 15  Glucose, capillary  Result Value Ref Range   Glucose-Capillary 168 (H) 70 - 99 mg/dL  Glucose, capillary  Result Value Ref Range   Glucose-Capillary 108 (H) 70 - 99 mg/dL  Glucose, capillary  Result Value Ref Range   Glucose-Capillary 141 (H) 70 - 99 mg/dL  CBC  Result Value Ref Range   WBC 10.6 (H) 4.0 - 10.5 K/uL   RBC 3.72 (L) 3.87 - 5.11 MIL/uL   Hemoglobin 8.7 (L) 12.0 - 15.0 g/dL   HCT 28.8 (L) 36 - 46 %   MCV 77.4 (L) 80.0 - 100.0 fL   MCH 23.4 (L) 26.0 - 34.0 pg   MCHC 30.2 30.0 - 36.0 g/dL   RDW 16.2 (H) 11.5 - 15.5 %   Platelets 268 150 - 400 K/uL   nRBC 0.0 0.0 - 0.2 %  Glucose, capillary  Result Value Ref Range   Glucose-Capillary 132 (H) 70 - 99 mg/dL  Glucose, capillary  Result Value  Ref Range   Glucose-Capillary 191 (H) 70 - 99 mg/dL  Glucose, capillary  Result Value Ref Range   Glucose-Capillary 74 70 - 99 mg/dL    Exam - Neurologically intact ABD soft Neurovascular intact Sensation intact distally Intact pulses distally Dorsiflexion/Plantar flexion intact Incision: dressing C/D/I and no drainage No cellulitis present Compartment soft no calf pain or sign of DVT Dressing/Incision - clean, dry, no drainage Motor function intact - moving foot and toes well on exam.   Assessment/Plan: 2 Days Post-Op Procedure(s) (LRB): TOTAL KNEE ARTHROPLASTY (Left)  Advance diet Up with therapy D/C IV fluids Past Medical History:  Diagnosis Date  . Acute bronchitis   . Anemia    hx of anemia  . Anginal pain ([place])   . Arthritis    osteo  . Asthma   . Coronary atherosclerosis   . Cough   . GERD (gastroesophageal reflux disease)   . History of colon polyps   . History of migraine   . Hyperlipidemia   . Hypertension   . Obstructive sleep apnea (adult) (pediatric)    NO CPAP   . Peripheral neuropathy   . Type II or unspecified type diabetes mellitus without mention of complication, not stated as  uncontrolled    type 2  . Unspecified hypothyroidism     DVT Prophylaxis - ASA Protocol Weight-Bearing as tolerated to Left leg D/C later today vs tomorrow depending on pain and progress Discussed with Dr. Tonita Cong Watching Hgb, appears stable at 8.7 this AM (8.9 yesterday)  Cecilie Kicks 02/07/2020, 8:22 AM

## 2020-02-11 ENCOUNTER — Telehealth: Payer: Self-pay | Admitting: Critical Care Medicine

## 2020-02-11 ENCOUNTER — Ambulatory Visit: Payer: 59

## 2020-02-11 NOTE — Telephone Encounter (Signed)
ATC Patient.  LM to call back. 

## 2020-02-12 NOTE — Telephone Encounter (Signed)
Patient is returning phone call. Patient phone number is [phone].

## 2020-02-12 NOTE — Telephone Encounter (Signed)
Pt called back, please return call  

## 2020-02-13 NOTE — Telephone Encounter (Signed)
Patient scheduled 02/14/20 at 1630.  Nothing further at this time.

## 2020-02-14 ENCOUNTER — Ambulatory Visit (INDEPENDENT_AMBULATORY_CARE_PROVIDER_SITE_OTHER): Payer: 59

## 2020-02-14 ENCOUNTER — Other Ambulatory Visit: Payer: Self-pay

## 2020-02-14 DIAGNOSIS — J4550 Severe persistent asthma, uncomplicated: Secondary | ICD-10-CM | POA: Diagnosis not present

## 2020-02-14 MED ORDER — MEPOLIZUMAB 100 MG ~~LOC~~ SOLR
100.0000 mg | SUBCUTANEOUS | Status: DC
  Administered 2020-02-14: 100 mg via SUBCUTANEOUS

## 2020-02-14 NOTE — Progress Notes (Signed)
All questions were answered by the patient before medication was administered. Have you been hospitalized in the last 10 days? No Do you have a fever? No Do you have a cough? No Do you have a headache or sore throat? No  

## 2020-02-19 ENCOUNTER — Other Ambulatory Visit: Payer: Self-pay

## 2020-02-19 ENCOUNTER — Ambulatory Visit: Payer: 59 | Attending: Specialist | Admitting: Physical Therapy

## 2020-02-19 ENCOUNTER — Encounter: Payer: Self-pay | Admitting: Physical Therapy

## 2020-02-19 DIAGNOSIS — M25662 Stiffness of left knee, not elsewhere classified: Secondary | ICD-10-CM | POA: Insufficient documentation

## 2020-02-19 DIAGNOSIS — R262 Difficulty in walking, not elsewhere classified: Secondary | ICD-10-CM

## 2020-02-19 DIAGNOSIS — M25562 Pain in left knee: Secondary | ICD-10-CM

## 2020-02-19 DIAGNOSIS — M6281 Muscle weakness (generalized): Secondary | ICD-10-CM | POA: Insufficient documentation

## 2020-02-19 DIAGNOSIS — M542 Cervicalgia: Secondary | ICD-10-CM | POA: Insufficient documentation

## 2020-02-19 NOTE — Therapy (Signed)
[facility] [address]  [place], [name], [address] Phone: [phone]   Fax:  [phone]  Physical Therapy Evaluation  Patient Details  [name] MRN: [id number] Date of Birth: 14-Mar-1956 Referring Provider (PT): Susa Day, MD   Encounter Date: 02/19/2020   PT End of Session - 02/19/20 1031    Visit Number 1    Number of Visits 13    Date for PT Re-Evaluation 04/01/20    Authorization Type [employer] & [employer]    PT Start Time [phone]    PT Stop Time 0927    PT Time Calculation (min) 35 min    Activity Tolerance Patient tolerated treatment well    Behavior During Therapy [facility] for tasks assessed/performed           Past Medical History:  Diagnosis Date  . Acute bronchitis   . Anemia    hx of anemia  . Anginal pain ([place])   . Arthritis    osteo  . Asthma   . Coronary atherosclerosis   . Cough   . GERD (gastroesophageal reflux disease)   . History of colon polyps   . History of migraine   . Hyperlipidemia   . Hypertension   . Obstructive sleep apnea (adult) (pediatric)    NO CPAP   . Peripheral neuropathy   . Type II or unspecified type diabetes mellitus without mention of complication, not stated as uncontrolled    type 2  . Unspecified hypothyroidism     Past Surgical History:  Procedure Laterality Date  . ANTERIOR FUSION CERVICAL SPINE    . CARPAL TUNNEL RELEASE Left   . COLONOSCOPY    . CORONARY ANGIOPLASTY    . LEFT HEART CATHETERIZATION WITH CORONARY ANGIOGRAM N/A 05/29/2014   Procedure: LEFT HEART CATHETERIZATION WITH CORONARY ANGIOGRAM;  Surgeon: Sinclair Grooms, MD;  Location: [facility] CATH LAB;  Service: Cardiovascular;  Laterality: N/A;  . 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   Procedure: TOTAL KNEE ARTHROPLASTY;  Surgeon: Susa Day, MD;  Location: [facility] ORS;  Service: Orthopedics;  Laterality: Left;  3 hrs  . TUBAL LIGATION    . WISDOM TOOTH EXTRACTION      There were  no vitals filed for this visit.    Subjective Assessment - 02/19/20 0855    Subjective Patient reports undergoing L TKA on 02/05/20. Has transitioned to [facility] in the last few days as she feels that it is less bulky than the walker. Notes that pain levels are well-controlled. Pain worse with prolonged standing. Notes an area of redness over the L shin but denies fever, chills, calf pain or warmth. Icing the knee 1x/hour when she has time. Also still struggling with neck pain with radiation down to the R shoulder. Still having N/T in R 4&5th digits and notes that it was shown that her ulnar nerve was affected on a recent nerve study. Worse with reaching and lifting.    Patient is accompained by: Family member   sister   Pertinent History DM II, HTN, angina, anemia, anterior cervical fusion, L heart cath & angio 2015    Limitations Sitting;Lifting;Standing;Walking;House hold activities    Diagnostic tests none recent    Patient Stated Goals get rid of pain    Currently in Pain? Yes    Pain Score 0-No pain    Pain Location Knee    Pain Orientation Left    Pain Descriptors / Indicators Aching;[place]  Pain Type Acute pain;Surgical pain    Multiple Pain Sites Yes    Pain Score 0    Pain Location Neck    Pain Orientation Posterior    Pain Descriptors / Indicators Aching;Tightness    Pain Type Chronic pain    Pain Radiating Towards to R shoulder              [facility] PT Assessment - 02/19/20 0903      Assessment   Medical Diagnosis Aftercare following joint replacement surgery    Referring Provider (PT) Susa Day, MD    Onset Date/Surgical Date 02/05/20    Hand Dominance Right    Next MD Visit 02/19/20    Prior Therapy yes- neck      Precautions   Precautions None      Balance Screen   Has the patient fallen in the past 6 months No    Has the patient had a decrease in activity level because of a fear of falling?  No    Is the patient reluctant to leave their home because of a fear  of falling?  No      Home Environment   Living Environment Private residence    Living Arrangements Other relatives    Type of [place] Access Stairs to enter    Entrance Stairs-Number of Steps 4 or 2    Entrance Stairs-Rails Right;Left    Home Layout Two level    Alternate Level Stairs-Number of Steps 13    Alternate Level Stairs-Rails Right    Home Equipment Walker - 2 wheels;Cane - single point;Bedside commode      Prior Function   Level of Independence Independent    Vocation Part time employment    Vocation Requirements bus driver    Leisure crochet, walking for exercise      Cognition   Overall Cognitive Status Within Functional Limits for tasks assessed      Observation/Other Assessments   Observations mild-mod edema in L knee and lower leg; incision covered by bandage, slight redness over L medial thigh and anterior shin      Sensation   Light Touch Appears Intact      Coordination   Gross Motor Movements are Fluid and Coordinated Yes      Posture/Postural Control   Posture/Postural Control Postural limitations    Postural Limitations Rounded Shoulders;Forward head      ROM / Strength   AROM / PROM / Strength AROM;PROM;Strength      AROM   AROM Assessment Site Knee    Right/Left Knee Right;Left    Right Knee Extension 0    Right Knee Flexion 120    Left Knee Extension 4    Left Knee Flexion 70      PROM   PROM Assessment Site Knee    Right/Left Knee Right;Left    Right Knee Extension 0    Right Knee Flexion 124    Left Knee Extension 1    Left Knee Flexion 74      Strength   Strength Assessment Site Hip;Knee;Ankle    Right/Left Shoulder Right;Left    Right/Left Hip Right;Left    Right Hip Flexion 4+/5    Right Hip ABduction 4+/5    Right Hip ADduction 4/5    Left Hip Flexion 4/5    Left Hip ABduction 4/5    Left Hip ADduction 4/5    Right/Left Knee Right;Left    Right Knee Flexion 4/5  Right Knee Extension 4/5    Left Knee  Flexion 3+/5    Left Knee Extension 3+/5    Right/Left Ankle Right;Left    Right Ankle Dorsiflexion 4/5    Right Ankle Plantar Flexion 4/5    Left Ankle Dorsiflexion 4/5    Left Ankle Plantar Flexion 4/5      Palpation   Palpation comment no TTP over L knee or calf      Ambulation/Gait   Assistive device Straight cane    Gait Pattern Step-to pattern;Step-through pattern;Decreased weight shift to left;Decreased stance time - left;Decreased step length - right;Decreased hip/knee flexion - left;Lateral trunk lean to right;Lateral trunk lean to left;Trunk flexed    Ambulation Surface Level;Indoor    Gait velocity decreased                      Objective measurements completed on examination: See above findings.               PT Education - 02/19/20 1030    Education Details prognosis, POC, HEP; educated patient on signs/symptoms of infection and advised patient to consult with MD today on L LE redness    Person(s) Educated Patient;Other (comment)   sister   Methods Explanation;Demonstration;Tactile cues;Verbal cues;Handout    Comprehension Verbalized understanding            PT Short Term Goals - 02/19/20 1039      PT SHORT TERM GOAL #1   Title Patient to be independent with initial HEP.    Time 3    Period Weeks    Status New    Target Date 03/11/20             PT Long Term Goals - 02/19/20 1039      PT LONG TERM GOAL #1   Title Patient to be independent with advanced HEP.    Time 6    Period Weeks    Status New    Target Date 04/01/20      PT LONG TERM GOAL #2   Title Patient to demonstrate cervical AROM WFL and without pain limiting.    Time 6    Status New    Target Date 04/01/20      PT LONG TERM GOAL #3   Title Patient to demonstrate R UE strength >/=4+/5 and symmetrical grip strength.    Time 6    Period Weeks    Status New    Target Date 04/01/20      PT LONG TERM GOAL #4   Title Patient to report 90% improvement in  driving tolerance in order to return to work pain-free.    Time 6    Period Weeks    Status New    Target Date 04/01/20      PT LONG TERM GOAL #5   Title Patient to demonstrate L knee AROM 0-120 degrees.    Time 6    Period Weeks    Status New    Target Date 04/01/20      Additional Long Term Goals   Additional Long Term Goals Yes      PT LONG TERM GOAL #6   Title Patient to demonstrate B LE strength >/=4+/5.    Time 6    Period Weeks    Status New    Target Date 04/01/20      PT LONG TERM GOAL #7   Title Patient to demonstrate reciprocal stair climbing up/down 13 stairs with  1 handrail as needed with good stability and <3/10 pain.    Time 6    Period Weeks    Status New    Target Date 04/01/20                  Plan - 02/19/20 1032    Clinical Impression Statement Patient is a 64y/o F presenting to OPPT with c/o L knee pain s/p L TKA on 02/05/20. Patient now ambulating with SPC and notes pain with prolonged standing. Patient would like to return to walking for exercise which she is unable to do currently d/t pain. Also notes that she still struggles with cervical pain that radiates into the R shoulder. Notes N/T in the R 4th and 5th digits. Patient today presents with rounded posture, decreased L knee ROM, decreased B LE strength, and gait deviations. Mild-moderate edema also evident in the L lower leg, as well as redness over the L anterior shin and medial thigh. However, patient without tenderness or excessive warmth over the L knee or lower leg. As patient is following up with her surgeon today, advised her to consult with him today on L LE redness and educated patient on s/s of infection. Patient was also educated on gentle stretching and strengthening HEP and post-op precautions. Patient reported understanding. Would benefit from skilled PT services 2x/week for 6 weeks to address aforementioned impairments as well as cervical pain. Cervical spine to be assessed next  session.    Personal Factors and Comorbidities Age;Sex;Comorbidity 3+;Past/Current Experience;Profession;Time since onset of injury/illness/exacerbation;Fitness    Comorbidities DM II, HTN, angina, anemia, anterior cervical fusion, L heart cath & angio 2015    Examination-Activity Limitations Sleep;Carry;Dressing;Hygiene/Grooming;Lift;Reach Overhead;Self Feeding;Sit;Squat;Bend;Caring for Others;Stairs;Stand;Toileting;Transfers;Locomotion Level;Bathing    Examination-Participation Restrictions Cleaning;Shop;Community Activity;Driving;Yard Work;Laundry;Meal Prep;Church    Stability/Clinical Decision Making Stable/Uncomplicated    Clinical Decision Making Low    Rehab Potential Good    PT Frequency 2x / week    PT Duration 6 weeks    PT Treatment/Interventions ADLs/Self Care Home Management;Cryotherapy;Electrical Stimulation;Moist Heat;Therapeutic exercise;Therapeutic activities;Functional mobility training;Ultrasound;Neuromuscular re-education;Patient/family education;Manual techniques;Taping;Energy conservation;Dry needling;Balance training;Stair training;Gait training;Vasopneumatic Device;Passive range of motion;Scar mobilization    PT Next Visit Plan knee FOTO; reassess HEP, assess cervical ROM, UE strength, palpation of cervical spine/musculature    Consulted and Agree with Plan of Care Patient;Family member/caregiver    Family Member Consulted sister           Patient will benefit from skilled therapeutic intervention in order to improve the following deficits and impairments:  Increased edema, Decreased activity tolerance, Decreased strength, Increased fascial restricitons, Pain, Impaired UE functional use, Increased muscle spasms, Improper body mechanics, Decreased range of motion, Impaired flexibility, Postural dysfunction, Abnormal gait, Hypomobility, Decreased scar mobility, Decreased balance, Difficulty walking  Visit Diagnosis: Acute pain of left knee  Stiffness of left knee, not  elsewhere classified  Cervicalgia  Muscle weakness (generalized)  Difficulty in walking, not elsewhere classified     Problem List Patient Active Problem List   Diagnosis Date Noted  . 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 Acute bronchitis 08/10/2014  . Unstable angina ([address]) 05/29/2014  . Abnormal nuclear stress test 05/29/2014  . Chest pain 05/06/2014  . Exertional shortness of breath 05/06/2014  . Mixed hyperlipidemia 05/06/2014  . Hyperlipidemia with target LDL less than 70 08/05/2013  . HTN (hypertension) 08/05/2013  . Chest wall pain 08/05/2013  . SINUSITIS, ACUTE 04/07/2009  . HYPOTHYROIDISM 12/10/2007  . DIABETES MELLITUS, TYPE II 05/28/2007  . Obstructive sleep apnea 05/28/2007  .  Coronary atherosclerosis 05/28/2007  . Seasonal and perennial allergic rhinitis 05/28/2007  . COUGH, CHRONIC 05/28/2007    Janene Harvey, PT, DPT 02/19/20 10:44 AM   [facility] [address]  [place], [name], [address] Phone: [phone]   Fax:  [phone]  [name] MRN: [id number] Date of Birth: 1955/09/23

## 2020-02-25 ENCOUNTER — Other Ambulatory Visit: Payer: Self-pay

## 2020-02-25 ENCOUNTER — Ambulatory Visit: Payer: 59

## 2020-02-25 DIAGNOSIS — M25562 Pain in left knee: Secondary | ICD-10-CM | POA: Diagnosis not present

## 2020-02-25 DIAGNOSIS — M542 Cervicalgia: Secondary | ICD-10-CM

## 2020-02-25 DIAGNOSIS — R262 Difficulty in walking, not elsewhere classified: Secondary | ICD-10-CM

## 2020-02-25 DIAGNOSIS — M25662 Stiffness of left knee, not elsewhere classified: Secondary | ICD-10-CM

## 2020-02-25 DIAGNOSIS — M6281 Muscle weakness (generalized): Secondary | ICD-10-CM

## 2020-02-25 NOTE — Therapy (Signed)
[facility] [address]  [place], [name], [address] Phone: [phone]   Fax:  [phone]  Physical Therapy Treatment  Patient Details  [name] MRN: [id number] Date of Birth: August 07, 1956 Referring Provider (PT): Susa Day, MD   Encounter Date: 02/25/2020   PT End of Session - 02/25/20 1550    Visit Number 2    Number of Visits 13    Date for PT Re-Evaluation 04/01/20    Authorization Type [employer] & [employer]    PT Start Time 4580    PT Stop Time 1626    PT Time Calculation (min) 55 min    Activity Tolerance Patient tolerated treatment well    Behavior During Therapy [facility] for tasks assessed/performed           Past Medical History:  Diagnosis Date  . Acute bronchitis   . Anemia    hx of anemia  . Anginal pain ([place])   . Arthritis    osteo  . Asthma   . Coronary atherosclerosis   . Cough   . GERD (gastroesophageal reflux disease)   . History of colon polyps   . History of migraine   . Hyperlipidemia   . Hypertension   . Obstructive sleep apnea (adult) (pediatric)    NO CPAP   . Peripheral neuropathy   . Type II or unspecified type diabetes mellitus without mention of complication, not stated as uncontrolled    type 2  . Unspecified hypothyroidism     Past Surgical History:  Procedure Laterality Date  . ANTERIOR FUSION CERVICAL SPINE    . CARPAL TUNNEL RELEASE Left   . COLONOSCOPY    . CORONARY ANGIOPLASTY    . LEFT HEART CATHETERIZATION WITH CORONARY ANGIOGRAM N/A 05/29/2014   Procedure: LEFT HEART CATHETERIZATION WITH CORONARY ANGIOGRAM;  Surgeon: Sinclair Grooms, MD;  Location: [facility] CATH LAB;  Service: Cardiovascular;  Laterality: N/A;  . 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   Procedure: TOTAL KNEE ARTHROPLASTY;  Surgeon: Susa Day, MD;  Location: [facility] ORS;  Service: Orthopedics;  Laterality: Left;  3 hrs  . TUBAL LIGATION    . WISDOM TOOTH EXTRACTION      There were  no vitals filed for this visit.   Subjective Assessment - 02/25/20 1549    Subjective Doing well.  Notes she has been doing HEP.    Patient is accompained by: Family member   sister   Pertinent History DM II, HTN, angina, anemia, anterior cervical fusion, L heart cath & angio 2015    Diagnostic tests none recent    Patient Stated Goals get rid of pain    Currently in Pain? Yes    Pain Score 1     Pain Location Knee    Pain Orientation Left    Pain Descriptors / Indicators Aching    Pain Type Acute pain;Surgical pain              [facility] PT Assessment - 02/25/20 0001      Observation/Other Assessments   Focus on Therapeutic Outcomes (FOTO)  Knee: 70% (30% limitation)      AROM   AROM Assessment Site Cervical    Cervical Flexion 35    Cervical Extension 48   pain up to a 6/10 at end ROM   Cervical - Right Side Bend 24    Cervical - Left Side Bend 24    Cervical - Right Rotation 42  Cervical - Left Rotation 49                         [facility] Adult PT Treatment/Exercise - 02/25/20 0001      Ambulation/Gait   Ambulation/Gait Yes    Ambulation/Gait Assistance 5: Supervision    Ambulation/Gait Assistance Details cues provided for B knee TKE and even weight shift/step length with L LE    Ambulation Distance (Feet) 90 Feet    Assistive device Straight cane    Gait Pattern Step-through pattern;Decreased weight shift to left;Decreased stance time - left;Decreased step length - right;Decreased hip/knee flexion - left;Lateral trunk lean to right;Lateral trunk lean to left;Trunk flexed    Ambulation Surface Level;Indoor      Knee/Hip Exercises: Stretches   Hip Flexor Stretch Left;1 rep;30 seconds    Hip Flexor Stretch Limitations mod thomas with strap      Knee/Hip Exercises: Aerobic   Nustep Lvl 1, 6 min (UE/LE)      Knee/Hip Exercises: Standing   Functional Squat 10 reps;3 seconds    Functional Squat Limitations counter to chair       Knee/Hip Exercises:  Supine   Quad Sets Left;10 reps;Strengthening    Quad Sets Limitations cues for increased quad set    Straight Leg Raises Left;10 reps;Strengthening    Straight Leg Raises Limitations cues for quad set; mild quad lag    Knee Flexion Left;AAROM;10 reps    Knee Flexion Limitations heel slide with strap       Vasopneumatic   Number Minutes Vasopneumatic  10 minutes    Vasopnuematic Location  Knee   L   Vasopneumatic Pressure Low    Vasopneumatic Temperature  coldest temp.                    PT Short Term Goals - 02/25/20 1551      PT SHORT TERM GOAL #1   Title Patient to be independent with initial HEP.    Time 3    Period Weeks    Status On-going    Target Date 03/11/20             PT Long Term Goals - 02/25/20 1551      PT LONG TERM GOAL #1   Title Patient to be independent with advanced HEP.    Time 6    Period Weeks    Status On-going      PT LONG TERM GOAL #2   Title Patient to demonstrate cervical AROM WFL and without pain limiting.    Time 6    Status On-going      PT LONG TERM GOAL #3   Title Patient to demonstrate R UE strength >/=4+/5 and symmetrical grip strength.    Time 6    Period Weeks    Status On-going      PT LONG TERM GOAL #4   Title Patient to report 90% improvement in driving tolerance in order to return to work pain-free.    Time 6    Period Weeks    Status On-going      PT LONG TERM GOAL #5   Title Patient to demonstrate L knee AROM 0-120 degrees.    Time 6    Period Weeks    Status On-going      PT LONG TERM GOAL #6   Title Patient to demonstrate B LE strength >/=4+/5.    Time 6    Period Weeks  Status On-going      PT LONG TERM GOAL #7   Title Patient to demonstrate reciprocal stair climbing up/down 13 stairs with 1 handrail as needed with good stability and <3/10 pain.    Time 6    Period Weeks    Status On-going                 Plan - 02/25/20 1551    Clinical Impression Statement [name] doing well.   Noting she has been performing HEP without issue.  Gait pattern with SPC somewhat antalgic with reduced R step-length and reduce L stance time.  Pt. with good improvement in gait mechanics with SPC after instruction.  Reviewed HEP with good overall technique.  Pt. requiring min cueing with counter squat for posterior translation of hips toward chair.  Ended visit with ice/compression to L knee to reduce pain and post-exercise swelling.  incision with small bandage at inferior incision however no excessive warmth or swelling and pt. verbalizing good care of incision per MD recommendation.    Comorbidities DM II, HTN, angina, anemia, anterior cervical fusion, L heart cath & angio 2015    Rehab Potential Good    PT Treatment/Interventions ADLs/Self Care Home Management;Cryotherapy;Electrical Stimulation;Moist Heat;Therapeutic exercise;Therapeutic activities;Functional mobility training;Ultrasound;Neuromuscular re-education;Patient/family education;Manual techniques;Taping;Energy conservation;Dry needling;Balance training;Stair training;Gait training;Vasopneumatic Device;Passive range of motion;Scar mobilization    PT Next Visit Plan UE strength, palpation of cervical spine/musculature    Consulted and Agree with Plan of Care Patient;Family member/caregiver    Family Member Consulted sister           Patient will benefit from skilled therapeutic intervention in order to improve the following deficits and impairments:  Increased edema, Decreased activity tolerance, Decreased strength, Increased fascial restricitons, Pain, Impaired UE functional use, Increased muscle spasms, Improper body mechanics, Decreased range of motion, Impaired flexibility, Postural dysfunction, Abnormal gait, Hypomobility, Decreased scar mobility, Decreased balance, Difficulty walking  Visit Diagnosis: Acute pain of left knee  Stiffness of left knee, not elsewhere classified  Cervicalgia  Muscle weakness  (generalized)  Difficulty in walking, not elsewhere classified     Problem List Patient Active Problem List   Diagnosis Date Noted  . 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 Acute bronchitis 08/10/2014  . Unstable angina ([place]) 05/29/2014  . Abnormal nuclear stress test 05/29/2014  . Chest pain 05/06/2014  . Exertional shortness of breath 05/06/2014  . Mixed hyperlipidemia 05/06/2014  . Hyperlipidemia with target LDL less than 70 08/05/2013  . HTN (hypertension) 08/05/2013  . Chest wall pain 08/05/2013  . SINUSITIS, ACUTE 04/07/2009  . HYPOTHYROIDISM 12/10/2007  . DIABETES MELLITUS, TYPE II 05/28/2007  . Obstructive sleep apnea 05/28/2007  . Coronary atherosclerosis 05/28/2007  . Seasonal and perennial allergic rhinitis 05/28/2007  . COUGH, CHRONIC 05/28/2007    Bess Harvest, PTA 02/25/20 4:36 PM   [facility] [address]  [place], [name], [address] Phone: [phone]   Fax:  [phone]  [name] MRN: [id number] Date of Birth: 01-01-1956

## 2020-02-27 ENCOUNTER — Ambulatory Visit: Payer: 59

## 2020-02-27 ENCOUNTER — Other Ambulatory Visit: Payer: Self-pay

## 2020-02-27 DIAGNOSIS — M25662 Stiffness of left knee, not elsewhere classified: Secondary | ICD-10-CM

## 2020-02-27 DIAGNOSIS — M25562 Pain in left knee: Secondary | ICD-10-CM | POA: Diagnosis not present

## 2020-02-27 DIAGNOSIS — R262 Difficulty in walking, not elsewhere classified: Secondary | ICD-10-CM

## 2020-02-27 DIAGNOSIS — M542 Cervicalgia: Secondary | ICD-10-CM

## 2020-02-27 DIAGNOSIS — M6281 Muscle weakness (generalized): Secondary | ICD-10-CM

## 2020-02-27 NOTE — Therapy (Signed)
[facility] [address]  [place], [name], [address] Phone: [phone]   Fax:  [phone]  Physical Therapy Treatment  Patient Details  [name] MRN: [id number] Date of Birth: 27-Jun-1956 Referring Provider (PT): Susa Day, MD   Encounter Date: 02/27/2020   PT End of Session - 02/27/20 1536    Visit Number 3    Number of Visits 13    Date for PT Re-Evaluation 04/01/20    Authorization Type [employer]    PT Start Time 7017    PT Stop Time 1617    PT Time Calculation (min) 46 min    Activity Tolerance Patient tolerated treatment well    Behavior During Therapy [facility] for tasks assessed/performed           Past Medical History:  Diagnosis Date  . Acute bronchitis   . Anemia    hx of anemia  . Anginal pain ([place])   . Arthritis    osteo  . Asthma   . Coronary atherosclerosis   . Cough   . GERD (gastroesophageal reflux disease)   . History of colon polyps   . History of migraine   . Hyperlipidemia   . Hypertension   . Obstructive sleep apnea (adult) (pediatric)    NO CPAP   . Peripheral neuropathy   . Type II or unspecified type diabetes mellitus without mention of complication, not stated as uncontrolled    type 2  . Unspecified hypothyroidism     Past Surgical History:  Procedure Laterality Date  . ANTERIOR FUSION CERVICAL SPINE    . CARPAL TUNNEL RELEASE Left   . COLONOSCOPY    . CORONARY ANGIOPLASTY    . LEFT HEART CATHETERIZATION WITH CORONARY ANGIOGRAM N/A 05/29/2014   Procedure: LEFT HEART CATHETERIZATION WITH CORONARY ANGIOGRAM;  Surgeon: Sinclair Grooms, MD;  Location: [facility] CATH LAB;  Service: Cardiovascular;  Laterality: N/A;  . 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   Procedure: TOTAL KNEE ARTHROPLASTY;  Surgeon: Susa Day, MD;  Location: [facility] ORS;  Service: Orthopedics;  Laterality: Left;  3 hrs  . TUBAL LIGATION    . WISDOM TOOTH EXTRACTION      There were  no vitals filed for this visit.   Subjective Assessment - 02/27/20 1535    Subjective Reports she felt typical soreness after last session.  Pt. noting she saw MD and they perscribed antibiotic for wound at L knee incision.    Pertinent History DM II, HTN, angina, anemia, anterior cervical fusion, L heart cath & angio 2015    Diagnostic tests none recent    Patient Stated Goals get rid of pain    Currently in Pain? Yes    Pain Score 1     Pain Location Knee    Pain Orientation Left    Pain Descriptors / Indicators Aching    Pain Type Acute pain;Surgical pain                             [facility] Adult PT Treatment/Exercise - 02/27/20 0001      Neck Exercises: Theraband   Rows 15 reps;Red    Rows Limitations standing with cueing for increased scap. retraction       Knee/Hip Exercises: Stretches   Passive Hamstring Stretch Left;2 reps;30 seconds    Passive Hamstring Stretch Limitations supine with strap     Hip Flexor Stretch Left;1  rep;30 seconds    Hip Flexor Stretch Limitations mod thomas with strap      Knee/Hip Exercises: Aerobic   Nustep Lvl 3, 7 min (UE/LE)      Knee/Hip Exercises: Standing   Hip Flexion Right;Left;10 reps;Knee bent    Hip Flexion Limitations 8" toe -clears with SPC      Knee/Hip Exercises: Seated   Long Arc Quad Left;10 reps;Strengthening    Long Arc Quad Limitations in chair - cues for TKE      Manual Therapy   Manual Therapy Soft tissue mobilization;Myofascial release    Manual therapy comments sitting     Soft tissue mobilization DTM/STM to B UT, LS, cervical paraspinals,     Myofascial Release TPR to B UT (L>R)      Neck Exercises: Stretches   Upper Trapezius Stretch Right;Left;1 rep;30 seconds    Upper Trapezius Stretch Limitations B UT    Levator Stretch Left;Right;1 rep;30 seconds    Levator Stretch Limitations hands hanging                     PT Short Term Goals - 02/25/20 1551      PT SHORT TERM GOAL #1    Title Patient to be independent with initial HEP.    Time 3    Period Weeks    Status On-going    Target Date 03/11/20             PT Long Term Goals - 02/25/20 1551      PT LONG TERM GOAL #1   Title Patient to be independent with advanced HEP.    Time 6    Period Weeks    Status On-going      PT LONG TERM GOAL #2   Title Patient to demonstrate cervical AROM WFL and without pain limiting.    Time 6    Status On-going      PT LONG TERM GOAL #3   Title Patient to demonstrate R UE strength >/=4+/5 and symmetrical grip strength.    Time 6    Period Weeks    Status On-going      PT LONG TERM GOAL #4   Title Patient to report 90% improvement in driving tolerance in order to return to work pain-free.    Time 6    Period Weeks    Status On-going      PT LONG TERM GOAL #5   Title Patient to demonstrate L knee AROM 0-120 degrees.    Time 6    Period Weeks    Status On-going      PT LONG TERM GOAL #6   Title Patient to demonstrate B LE strength >/=4+/5.    Time 6    Period Weeks    Status On-going      PT LONG TERM GOAL #7   Title Patient to demonstrate reciprocal stair climbing up/down 13 stairs with 1 handrail as needed with good stability and <3/10 pain.    Time 6    Period Weeks    Status On-going                 Plan - 02/27/20 1541    Clinical Impression Statement Pt. reports she was prescribed an antibiotic by MD for open wound on L knee incision which she said was a "blood blister".  Supervising PT and therapist inspecting incision with no signs of infection and pt. reporting she starts antibiotic later today.  Will see  MD next week for f/u.  Worked on gentle L knee ROM and quad strengthening, along with MT/STM to B upper shoulders and cervical musculature for improved tissue quality.  Reviewed cervical stretching with pt. reporting she will start back consistently performing these at home.  Ended visit pain free.  Deferred ice/compression to L knee as not  to overstress incision wound.  Will continue to progress toward goals.    Comorbidities DM II, HTN, angina, anemia, anterior cervical fusion, L heart cath & angio 2015    Rehab Potential Good    PT Frequency 2x / week    PT Treatment/Interventions ADLs/Self Care Home Management;Cryotherapy;Electrical Stimulation;Moist Heat;Therapeutic exercise;Therapeutic activities;Functional mobility training;Ultrasound;Neuromuscular re-education;Patient/family education;Manual techniques;Taping;Energy conservation;Dry needling;Balance training;Stair training;Gait training;Vasopneumatic Device;Passive range of motion;Scar mobilization    PT Next Visit Plan UE strength, palpation of cervical spine/musculature    Consulted and Agree with Plan of Care Patient           Patient will benefit from skilled therapeutic intervention in order to improve the following deficits and impairments:  Increased edema, Decreased activity tolerance, Decreased strength, Increased fascial restricitons, Pain, Impaired UE functional use, Increased muscle spasms, Improper body mechanics, Decreased range of motion, Impaired flexibility, Postural dysfunction, Abnormal gait, Hypomobility, Decreased scar mobility, Decreased balance, Difficulty walking  Visit Diagnosis: Acute pain of left knee  Stiffness of left knee, not elsewhere classified  Cervicalgia  Muscle weakness (generalized)  Difficulty in walking, not elsewhere classified     Problem List Patient Active Problem List   Diagnosis Date Noted  . 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 Acute bronchitis 08/10/2014  . Unstable angina ([place]) 05/29/2014  . Abnormal nuclear stress test 05/29/2014  . Chest pain 05/06/2014  . Exertional shortness of breath 05/06/2014  . Mixed hyperlipidemia 05/06/2014  . Hyperlipidemia with target LDL less than 70 08/05/2013  . HTN (hypertension) 08/05/2013  . Chest wall pain 08/05/2013  . SINUSITIS, ACUTE 04/07/2009  . HYPOTHYROIDISM 12/10/2007   . DIABETES MELLITUS, TYPE II 05/28/2007  . Obstructive sleep apnea 05/28/2007  . Coronary atherosclerosis 05/28/2007  . Seasonal and perennial allergic rhinitis 05/28/2007  . COUGH, CHRONIC 05/28/2007    Bess Harvest, PTA 02/27/20 4:37 PM   [facility] [address]  [place], [name], [address] Phone: [phone]   Fax:  [phone]  Name: VIANN NIELSON MRN: [id number] Date of Birth: 1955/10/30

## 2020-03-02 ENCOUNTER — Telehealth: Payer: Self-pay | Admitting: Critical Care Medicine

## 2020-03-02 NOTE — Telephone Encounter (Signed)
Nucala Order: 100mg #1 Vial Order Date: 03/02/20 Expected date of arrival: 03/03/20 Ordered by: Lejend Dalby,LPN [facility]: [facility] 

## 2020-03-03 ENCOUNTER — Ambulatory Visit: Payer: 59

## 2020-03-03 ENCOUNTER — Other Ambulatory Visit: Payer: Self-pay

## 2020-03-03 DIAGNOSIS — M25562 Pain in left knee: Secondary | ICD-10-CM

## 2020-03-03 DIAGNOSIS — R262 Difficulty in walking, not elsewhere classified: Secondary | ICD-10-CM

## 2020-03-03 DIAGNOSIS — M6281 Muscle weakness (generalized): Secondary | ICD-10-CM

## 2020-03-03 DIAGNOSIS — M25662 Stiffness of left knee, not elsewhere classified: Secondary | ICD-10-CM

## 2020-03-03 DIAGNOSIS — M542 Cervicalgia: Secondary | ICD-10-CM

## 2020-03-03 NOTE — Telephone Encounter (Signed)
Nucala Shipment Received: 100mg  #1 vial Medication arrival date: 03/04/20 Lot #: [id number] Exp date: 06/08/2023 Received by: Elliot Dally

## 2020-03-03 NOTE — Therapy (Signed)
[facility] [address]  [place], [name], [address] Phone: [phone]   Fax:  [phone]  Physical Therapy Treatment  Patient Details  [name] MRN: [id number] Date of Birth: 1955/11/11 Referring Provider (PT): Susa Day, MD   Encounter Date: 03/03/2020   PT End of Session - 03/03/20 1542    Visit Number 4    Number of Visits 13    Date for PT Re-Evaluation 04/01/20    Authorization Type [employer] & [employer]    PT Start Time 6063    PT Stop Time 1627    PT Time Calculation (min) 54 min    Activity Tolerance Patient tolerated treatment well    Behavior During Therapy [facility] for tasks assessed/performed           Past Medical History:  Diagnosis Date  . Acute bronchitis   . Anemia    hx of anemia  . Anginal pain ([place])   . Arthritis    osteo  . Asthma   . Coronary atherosclerosis   . Cough   . GERD (gastroesophageal reflux disease)   . History of colon polyps   . History of migraine   . Hyperlipidemia   . Hypertension   . Obstructive sleep apnea (adult) (pediatric)    NO CPAP   . Peripheral neuropathy   . Type II or unspecified type diabetes mellitus without mention of complication, not stated as uncontrolled    type 2  . Unspecified hypothyroidism     Past Surgical History:  Procedure Laterality Date  . ANTERIOR FUSION CERVICAL SPINE    . CARPAL TUNNEL RELEASE Left   . COLONOSCOPY    . CORONARY ANGIOPLASTY    . LEFT HEART CATHETERIZATION WITH CORONARY ANGIOGRAM N/A 05/29/2014   Procedure: LEFT HEART CATHETERIZATION WITH CORONARY ANGIOGRAM;  Surgeon: Sinclair Grooms, MD;  Location: [facility] CATH LAB;  Service: Cardiovascular;  Laterality: N/A;  . 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   Procedure: TOTAL KNEE ARTHROPLASTY;  Surgeon: Susa Day, MD;  Location: [facility] ORS;  Service: Orthopedics;  Laterality: Left;  3 hrs  . TUBAL LIGATION    . WISDOM TOOTH EXTRACTION      There were  no vitals filed for this visit.   Subjective Assessment - 03/03/20 1539    Subjective Patient reporting she has been up on her feet too much having some soreness.    Patient is accompained by: Family member   L   Pertinent History DM II, HTN, angina, anemia, anterior cervical fusion, L heart cath & angio 2015    Diagnostic tests none recent    Currently in Pain? No/denies    Pain Score 0-No pain   up to an 4/10 at times   Pain Location Knee    Pain Orientation Left    Pain Descriptors / Indicators Aching    Pain Type Acute pain;Surgical pain    Multiple Pain Sites No              [facility] PT Assessment - 03/03/20 0001      AROM   AROM Assessment Site Knee    Right/Left Knee Left    Left Knee Extension 2    Left Knee Flexion 90      Strength   Right/Left Hip Right;Left    Right Hip Flexion 4/5    Right Hip ABduction 4+/5    Right Hip ADduction 4/5    Left Hip Flexion 4/5  Left Hip ABduction 4+/5    Left Hip ADduction 4+/5    Right/Left Knee Right;Left    Right Knee Flexion 4/5    Right Knee Extension 4+/5    Left Knee Flexion 4/5    Left Knee Extension 4/5    Right/Left Ankle Right;Left    Right Ankle Dorsiflexion 4/5    Right Ankle Plantar Flexion 4/5    Left Ankle Dorsiflexion 4/5    Left Ankle Plantar Flexion 4/5                         [facility] Adult PT Treatment/Exercise - 03/03/20 0001      Ambulation/Gait   Ambulation/Gait Yes    Ambulation/Gait Assistance 5: Supervision    Ambulation/Gait Assistance Details cues for even step length and weight shift without AD    Ambulation Distance (Feet) 90 Feet    Assistive device None    Ambulation Surface Level;Indoor      Knee/Hip Exercises: Hydrologist Left;2 reps;30 seconds    Passive Hamstring Stretch Limitations supine with strap     Hip Flexor Stretch Left;30 seconds;2 reps    Hip Flexor Stretch Limitations mod thomas with strap      Knee/Hip Exercises: Aerobic    Nustep Lvl 3, 7 min (UE/LE)      Vasopneumatic   Number Minutes Vasopneumatic  10 minutes    Vasopnuematic Location  Knee   L   Vasopneumatic Pressure Low    Vasopneumatic Temperature  coldest temp.                   PT Short Term Goals - 03/03/20 1558      PT SHORT TERM GOAL #1   Title Patient to be independent with initial HEP.    Time 3    Period Weeks    Status Achieved    Target Date 03/11/20             PT Long Term Goals - 03/03/20 1554      PT LONG TERM GOAL #1   Title Patient to be independent with advanced HEP.    Time 6    Period Weeks    Status Partially Met   met for current     PT LONG TERM GOAL #2   Title Patient to demonstrate cervical AROM WFL and without pain limiting.    Time 6    Status On-going      PT LONG TERM GOAL #3   Title Patient to demonstrate R UE strength >/=4+/5 and symmetrical grip strength.    Time 6    Period Weeks    Status On-going      PT LONG TERM GOAL #4   Title Patient to report 90% improvement in driving tolerance in order to return to work pain-free.    Time 6    Period Weeks    Status Partially Met   03/03/20:  pt. noting 60-70% improvement     PT LONG TERM GOAL #5   Title Patient to demonstrate L knee AROM 0-120 degrees.    Time 6    Period Weeks    Status On-going      PT LONG TERM GOAL #6   Title Patient to demonstrate B LE strength >/=4+/5.    Time 6    Period Weeks    Status Partially Met      PT LONG TERM GOAL #7   Title Patient  to demonstrate reciprocal stair climbing up/down 13 stairs with 1 handrail as needed with good stability and <3/10 pain.    Time 6    Period Weeks    Status On-going                 Plan - 03/03/20 1543    Clinical Impression Statement Pt. knee progressing well with physical therapy.  Able to demonstrating progression of L knee AROM to 2-90dg.  Pt. does feel "tightness" at end ROM flexion around incision and sight of wound at inferior knee thus instructed pt.  not to overly stress this area today in session.  Progressing toward LTG #5.  Pt. denies questions regarding initial HEP.  STG #1 met.  Pt. noting 60-70% improvement in driving tolerance since starting therapy.  LTG #4 partially met.  Pt. able to partially achieve LTG #6 demonstrating R knee extension strength of 4+/5 with MMT grossly with 4/5 strength across other LE muscle groups.  L inferior knee wound visually inspected in session today.  Wound with some visible eschar however size somewhat reduced today.  Pt. reporting consistent changing of bandages and keeping area clean.  Sees MD tomorrow for f/u.  ended visit with ice/compression to L knee to reduce post-exercise swelling and pain.    Comorbidities DM II, HTN, angina, anemia, anterior cervical fusion, L heart cath & angio 2015    Rehab Potential Good    PT Frequency 2x / week    PT Treatment/Interventions ADLs/Self Care Home Management;Cryotherapy;Electrical Stimulation;Moist Heat;Therapeutic exercise;Therapeutic activities;Functional mobility training;Ultrasound;Neuromuscular re-education;Patient/family education;Manual techniques;Taping;Energy conservation;Dry needling;Balance training;Stair training;Gait training;Vasopneumatic Device;Passive range of motion;Scar mobilization    PT Next Visit Plan UE strength, palpation of cervical spine/musculature    Consulted and Agree with Plan of Care Patient           Patient will benefit from skilled therapeutic intervention in order to improve the following deficits and impairments:  Increased edema, Decreased activity tolerance, Decreased strength, Increased fascial restricitons, Pain, Impaired UE functional use, Increased muscle spasms, Improper body mechanics, Decreased range of motion, Impaired flexibility, Postural dysfunction, Abnormal gait, Hypomobility, Decreased scar mobility, Decreased balance, Difficulty walking  Visit Diagnosis: Acute pain of left knee  Stiffness of left knee, not  elsewhere classified  Cervicalgia  Muscle weakness (generalized)  Difficulty in walking, not elsewhere classified     Problem List Patient Active Problem List   Diagnosis Date Noted  . 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 Acute bronchitis 08/10/2014  . Unstable angina ([place]) 05/29/2014  . Abnormal nuclear stress test 05/29/2014  . Chest pain 05/06/2014  . Exertional shortness of breath 05/06/2014  . Mixed hyperlipidemia 05/06/2014  . Hyperlipidemia with target LDL less than 70 08/05/2013  . HTN (hypertension) 08/05/2013  . Chest wall pain 08/05/2013  . SINUSITIS, ACUTE 04/07/2009  . HYPOTHYROIDISM 12/10/2007  . DIABETES MELLITUS, TYPE II 05/28/2007  . Obstructive sleep apnea 05/28/2007  . Coronary atherosclerosis 05/28/2007  . Seasonal and perennial allergic rhinitis 05/28/2007  . COUGH, CHRONIC 05/28/2007    Bess Harvest, PTA 03/03/20 4:40 PM   [facility] [address]  [place], [name], [address] Phone: [phone]   Fax:  [phone]  [name] MRN: [id number] Date of Birth: 03/28/1956

## 2020-03-05 ENCOUNTER — Ambulatory Visit: Payer: 59

## 2020-03-05 ENCOUNTER — Other Ambulatory Visit: Payer: Self-pay

## 2020-03-05 DIAGNOSIS — M25562 Pain in left knee: Secondary | ICD-10-CM

## 2020-03-05 DIAGNOSIS — M6281 Muscle weakness (generalized): Secondary | ICD-10-CM

## 2020-03-05 DIAGNOSIS — R262 Difficulty in walking, not elsewhere classified: Secondary | ICD-10-CM

## 2020-03-05 DIAGNOSIS — M25662 Stiffness of left knee, not elsewhere classified: Secondary | ICD-10-CM

## 2020-03-05 DIAGNOSIS — M542 Cervicalgia: Secondary | ICD-10-CM

## 2020-03-05 NOTE — Therapy (Signed)
[facility] [address]  [place], [name], [address] Phone: [phone]   Fax:  [phone]  Physical Therapy Treatment  Patient Details  [name] MRN: [id number] Date of Birth: 04-Apr-1956 Referring Provider (PT): Susa Day, MD   Encounter Date: 03/05/2020   PT End of Session - 03/05/20 1537    Visit Number 5    Number of Visits 13    Date for PT Re-Evaluation 04/01/20    Authorization Type [employer]    PT Start Time 1532    PT Stop Time 1625    PT Time Calculation (min) 53 min    Activity Tolerance Patient tolerated treatment well    Behavior During Therapy [facility] for tasks assessed/performed           Past Medical History:  Diagnosis Date  . Acute bronchitis   . Anemia    hx of anemia  . Anginal pain ([place])   . Arthritis    osteo  . Asthma   . Coronary atherosclerosis   . Cough   . GERD (gastroesophageal reflux disease)   . History of colon polyps   . History of migraine   . Hyperlipidemia   . Hypertension   . Obstructive sleep apnea (adult) (pediatric)    NO CPAP   . Peripheral neuropathy   . Type II or unspecified type diabetes mellitus without mention of complication, not stated as uncontrolled    type 2  . Unspecified hypothyroidism     Past Surgical History:  Procedure Laterality Date  . ANTERIOR FUSION CERVICAL SPINE    . CARPAL TUNNEL RELEASE Left   . COLONOSCOPY    . CORONARY ANGIOPLASTY    . LEFT HEART CATHETERIZATION WITH CORONARY ANGIOGRAM N/A 05/29/2014   Procedure: LEFT HEART CATHETERIZATION WITH CORONARY ANGIOGRAM;  Surgeon: Sinclair Grooms, MD;  Location[facility] CATH LAB;  Service: Cardiovascular;  Laterality: N/A;  . 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   Procedure: TOTAL KNEE ARTHROPLASTY;  Surgeon: Susa Day, MD;  Location: [facility] ORS;  Service: Orthopedics;  Laterality: Left;  3 hrs  . TUBAL LIGATION    . WISDOM TOOTH EXTRACTION      There were  no vitals filed for this visit.   Subjective Assessment - 03/05/20 1537    Subjective Pt. noting MD liked the status of the L knee wound on f/u yesterday.    Pertinent History DM II, HTN, angina, anemia, anterior cervical fusion, L heart cath & angio 2015    Diagnostic tests none recent    Patient Stated Goals get rid of pain    Currently in Pain? No/denies    Pain Score 0-No pain    Pain Location Knee    Pain Orientation Left    Multiple Pain Sites No              [facility] PT Assessment - 03/05/20 0001      Assessment   Medical Diagnosis Aftercare following joint replacement surgery    Referring Provider (PT) Susa Day, MD    Onset Date/Surgical Date 02/05/20    Hand Dominance Right    Next MD Visit 03/11/20                         [facility] Adult PT Treatment/Exercise - 03/05/20 0001      Neck Exercises: Seated   Money 10 reps;3 secs    Money Limitations seated  Knee/Hip Exercises: Stretches   Hip Flexor Stretch Left;30 seconds;2 reps    Hip Flexor Stretch Limitations mod thomas with strap    Knee: Self-Stretch to increase Flexion Left    Knee: Self-Stretch Limitations 5" x 10 reps foot on TM lunge flexion stretch       Knee/Hip Exercises: Aerobic   Recumbent Bike lvl 1, 6 in - partial revolutions       Knee/Hip Exercises: Standing   Forward Step Up Right;Left;5 reps;1 set;Step Height: 8"    Forward Step Up Limitations 1 rail use     Functional Squat 10 reps;3 seconds      Vasopneumatic   Number Minutes Vasopneumatic  10 minutes    Vasopnuematic Location  Knee   L   Vasopneumatic Pressure Low    Vasopneumatic Temperature  coldest temp.      Manual Therapy   Manual Therapy Myofascial release;Soft tissue mobilization    Manual therapy comments sitting     Soft tissue mobilization DTM to B UT, scalenes, LS, rhomboids     Myofascial Release TPR to R UT, R rhomboids                   PT Education - 03/05/20 1806    Education Details  HEP update;  no money, forward step-up, knee flexion stretch    Person(s) Educated Patient    Methods Explanation;Demonstration;Verbal cues;Handout    Comprehension Verbalized understanding;Returned demonstration;Verbal cues required            PT Short Term Goals - 03/03/20 1558      PT SHORT TERM GOAL #1   Title Patient to be independent with initial HEP.    Time 3    Period Weeks    Status Achieved    Target Date 03/11/20             PT Long Term Goals - 03/03/20 1554      PT LONG TERM GOAL #1   Title Patient to be independent with advanced HEP.    Time 6    Period Weeks    Status Partially Met   met for current     PT LONG TERM GOAL #2   Title Patient to demonstrate cervical AROM WFL and without pain limiting.    Time 6    Status On-going      PT LONG TERM GOAL #3   Title Patient to demonstrate R UE strength >/=4+/5 and symmetrical grip strength.    Time 6    Period Weeks    Status On-going      PT LONG TERM GOAL #4   Title Patient to report 90% improvement in driving tolerance in order to return to work pain-free.    Time 6    Period Weeks    Status Partially Met   03/03/20:  pt. noting 60-70% improvement     PT LONG TERM GOAL #5   Title Patient to demonstrate L knee AROM 0-120 degrees.    Time 6    Period Weeks    Status On-going      PT LONG TERM GOAL #6   Title Patient to demonstrate B LE strength >/=4+/5.    Time 6    Period Weeks    Status Partially Met      PT LONG TERM GOAL #7   Title Patient to demonstrate reciprocal stair climbing up/down 13 stairs with 1 handrail as needed with good stability and <3/10 pain.    Time  [place] - 03/05/20 1538    Clinical Impression Statement Asa Lente doing well.  Got a good report from yesterday's f/u with MD regarding improved healing with L inferior knee wound at incision.  Visible improvement in knee flexion ROM and patient now walking with SPC  with nearly normalized mechanics.  progressed step-ups and patient ended visit with ice/compression to L knee to reduce swelling and soreness.    Comorbidities DM II, HTN, angina, anemia, anterior cervical fusion, L heart cath & angio 2015    Rehab Potential Good    PT Frequency 2x / week    PT Duration 6 weeks    PT Treatment/Interventions ADLs/Self Care Home Management;Cryotherapy;Electrical Stimulation;Moist Heat;Therapeutic exercise;Therapeutic activities;Functional mobility training;Ultrasound;Neuromuscular re-education;Patient/family education;Manual techniques;Taping;Energy conservation;Dry needling;Balance training;Stair training;Gait training;Vasopneumatic Device;Passive range of motion;Scar mobilization    PT Next Visit Plan UE strength, palpation of cervical spine/musculature    Consulted and Agree with Plan of Care Patient    Family Member Consulted sister           Patient will benefit from skilled therapeutic intervention in order to improve the following deficits and impairments:  Increased edema, Decreased activity tolerance, Decreased strength, Increased fascial restricitons, Pain, Impaired UE functional use, Increased muscle spasms, Improper body mechanics, Decreased range of motion, Impaired flexibility, Postural dysfunction, Abnormal gait, Hypomobility, Decreased scar mobility, Decreased balance, Difficulty walking  Visit Diagnosis: Acute pain of left knee  Stiffness of left knee, not elsewhere classified  Cervicalgia  Muscle weakness (generalized)  Difficulty in walking, not elsewhere classified     Problem List Patient Active Problem List   Diagnosis Date Noted  . 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 Acute bronchitis 08/10/2014  . Unstable angina ([place]) 05/29/2014  . Abnormal nuclear stress test 05/29/2014  . Chest pain 05/06/2014  . Exertional shortness of breath 05/06/2014  . Mixed hyperlipidemia 05/06/2014  . Hyperlipidemia with target LDL less than 70  08/05/2013  . HTN (hypertension) 08/05/2013  . Chest wall pain 08/05/2013  . SINUSITIS, ACUTE 04/07/2009  . HYPOTHYROIDISM 12/10/2007  . DIABETES MELLITUS, TYPE II 05/28/2007  . Obstructive sleep apnea 05/28/2007  . Coronary atherosclerosis 05/28/2007  . Seasonal and perennial allergic rhinitis 05/28/2007  . COUGH, CHRONIC 05/28/2007    Bess Harvest, PTA 03/05/20 6:09 PM   [facility] [address]  [place], [name], [address] Phone: [phone]   Fax:  [phone]  [name] MRN: [id number] Date of Birth: [birth year]

## 2020-03-10 ENCOUNTER — Encounter: Payer: Self-pay | Admitting: Physical Therapy

## 2020-03-10 ENCOUNTER — Ambulatory Visit: Payer: 59 | Attending: Specialist | Admitting: Physical Therapy

## 2020-03-10 ENCOUNTER — Other Ambulatory Visit: Payer: Self-pay

## 2020-03-10 DIAGNOSIS — R262 Difficulty in walking, not elsewhere classified: Secondary | ICD-10-CM | POA: Insufficient documentation

## 2020-03-10 DIAGNOSIS — M25662 Stiffness of left knee, not elsewhere classified: Secondary | ICD-10-CM

## 2020-03-10 DIAGNOSIS — M542 Cervicalgia: Secondary | ICD-10-CM | POA: Insufficient documentation

## 2020-03-10 DIAGNOSIS — M25562 Pain in left knee: Secondary | ICD-10-CM | POA: Diagnosis present

## 2020-03-10 DIAGNOSIS — R29898 Other symptoms and signs involving the musculoskeletal system: Secondary | ICD-10-CM | POA: Insufficient documentation

## 2020-03-10 DIAGNOSIS — M6281 Muscle weakness (generalized): Secondary | ICD-10-CM | POA: Insufficient documentation

## 2020-03-10 NOTE — Therapy (Signed)
[facility] [address]  [place], [name], [address] Phone: [phone]   Fax:  [phone]  Physical Therapy Treatment  Patient Details  [name] MRN: [id number] Date of Birth: Jan 24, 1956 Referring Provider (PT): Susa Day, MD   Encounter Date: 03/10/2020   PT End of Session - 03/10/20 1722    Visit Number 6    Number of Visits 13    Date for PT Re-Evaluation 04/01/20    Authorization Type [place]    PT Start Time 9379   pt late   PT Stop Time 1611    PT Time Calculation (min) 34 min    Activity Tolerance Patient tolerated treatment well;Patient limited by pain    Behavior During Therapy [facility] for tasks assessed/performed           Past Medical History:  Diagnosis Date  . Acute bronchitis   . Anemia    hx of anemia  . Anginal pain ([place])   . Arthritis    osteo  . Asthma   . Coronary atherosclerosis   . Cough   . GERD (gastroesophageal reflux disease)   . History of colon polyps   . History of migraine   . Hyperlipidemia   . Hypertension   . Obstructive sleep apnea (adult) (pediatric)    NO CPAP   . Peripheral neuropathy   . Type II or unspecified type diabetes mellitus without mention of complication, not stated as uncontrolled    type 2  . Unspecified hypothyroidism     Past Surgical History:  Procedure Laterality Date  . ANTERIOR FUSION CERVICAL SPINE    . CARPAL TUNNEL RELEASE Left   . COLONOSCOPY    . CORONARY ANGIOPLASTY    . LEFT HEART CATHETERIZATION WITH CORONARY ANGIOGRAM N/A 05/29/2014   Procedure: LEFT HEART CATHETERIZATION WITH CORONARY ANGIOGRAM;  Surgeon: Sinclair Grooms, MD;  Location: [facility] CATH LAB;  Service: Cardiovascular;  Laterality: N/A;  . 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   Procedure: TOTAL KNEE ARTHROPLASTY;  Surgeon: Susa Day, MD;  Location: [facility];  Service: Orthopedics;  Laterality: Left;  3 hrs  . TUBAL LIGATION    . WISDOM TOOTH  EXTRACTION      There were no vitals filed for this visit.   Subjective Assessment - 03/10/20 1539    Subjective Having a 10/10 HA over the L forehead and cheek which radiates into her L nose and sinuses. Has been sneezing a lot and taking some allergy meds. Denies dizziness, chest pain, SOB, N/T. Reporting that her L knee incision appears yellow.    Pertinent History DM II, HTN, angina, anemia, anterior cervical fusion, L heart cath & angio 2015    Diagnostic tests none recent    Patient Stated Goals get rid of pain    Currently in Pain? Yes    Pain Score 6     Pain Location Knee    Pain Orientation Left    Pain Descriptors / Indicators --   "stings"   Pain Score 10    Pain Location Head    Pain Orientation Left    Pain Descriptors / Indicators Aching    Pain Type Acute pain    Pain Radiating Towards into L sinuses              [facility] PT Assessment - 03/10/20 0001      Assessment   Medical Diagnosis Aftercare following joint replacement surgery  Referring Provider (PT) Susa Day, MD    Onset Date/Surgical Date 02/05/20      AROM   Left Knee Extension 2    Left Knee Flexion 93      PROM   Left Knee Extension 1    Left Knee Flexion 97      Strength   Right Hip Flexion 4+/5    Right Hip ABduction 4+/5    Right Hip ADduction 4+/5    Left Hip Flexion 4+/5    Left Hip ABduction 4+/5    Left Hip ADduction 4+/5    Right Knee Flexion 4/5    Right Knee Extension 4+/5    Left Knee Flexion 4+/5    Left Knee Extension 4/5    Right Ankle Dorsiflexion 4+/5    Right Ankle Plantar Flexion 4+/5    Left Ankle Dorsiflexion 4+/5    Left Ankle Plantar Flexion 4+/5                         [facility] Adult PT Treatment/Exercise - 03/10/20 0001      Ambulation/Gait   Stairs Yes    Stairs Assistance 4: Min guard    Stair Management Technique One rail Right;One rail Left;Alternating pattern    Number of Stairs 13    Height of Stairs 8    Gait Comments  alternating reciprocal pattern with anterior trunk flexion ascending anf poor quad control descending      Knee/Hip Exercises: Stretches   Other Knee/Hip Stretches sitting L knee flexion stretch 5x5"   to tolerance     Knee/Hip Exercises: Aerobic   Recumbent Bike lvl 1, 6 in - partial revolutions       Knee/Hip Exercises: Supine   Quad Sets Left;10 reps;Strengthening    Quad Sets Limitations 10x5" with 1/2 bolster under ankle    Heel Slides Left;1 set;10 reps    Heel Slides Limitations 10x3" with orange pball and strap                  PT Education - 03/10/20 1722    Education Details discussion on objective progress and remaining impairments    Person(s) Educated Patient    Methods Explanation;Demonstration;Verbal cues;Tactile cues    Comprehension Verbalized understanding            PT Short Term Goals - 03/03/20 1558      PT SHORT TERM GOAL #1   Title Patient to be independent with initial HEP.    Time 3    Period Weeks    Status Achieved    Target Date 03/11/20             PT Long Term Goals - 03/10/20 1726      PT LONG TERM GOAL #1   Title Patient to be independent with advanced HEP.    Time 6    Period Weeks    Status Partially Met   met for current     PT LONG TERM GOAL #2   Title Patient to demonstrate cervical AROM WFL and without pain limiting.    Time 6    Status On-going      PT LONG TERM GOAL #3   Title Patient to demonstrate R UE strength >/=4+/5 and symmetrical grip strength.    Time 6    Period Weeks    Status On-going      PT LONG TERM GOAL #4   Title Patient to report 90% improvement in  driving tolerance in order to return to work pain-free.    Time 6    Period Weeks    Status Partially Met   03/03/20:  pt. noting 60-70% improvement     PT LONG TERM GOAL #5   Title Patient to demonstrate L knee AROM 0-120 degrees.    Time 6    Period Weeks    Status On-going   03/10/20 L knee AROM 2-93 degrees and PROM 1-97 degrees      PT LONG TERM GOAL #6   Title Patient to demonstrate B LE strength >/=4+/5.    Time 6    Period Weeks    Status Partially Met   03/10/20: improved in B hip flexion, R hip adduction, L knee flexion, B ankle DF/PF     PT LONG TERM GOAL #7   Title Patient to demonstrate reciprocal stair climbing up/down 13 stairs with 1 handrail as needed with good stability and <3/10 pain.    Time 6    Period Weeks    Status Partially Met   able to demonstrate reciprocal alternating pattern but with poor eccentric control and c/o B knee pain                Plan - 03/10/20 1723    Clinical Impression Statement Patient arrived late to session with report of a 10/10 HA over the L forehead and cheek which radiates into her L nose and sinuses. Endorses that she has been sneezing a lot and taking some allergy meds. Denies dizziness, chest pain, SOB, N/T and is agreeable to proceed with session. Reporting that her L knee incision appears yellow. Upon inspection of L knee wound, tissue appears slightly yellow likely from the yellow color of the antibiotic strip transferring onto patient's tissue, with healthy pink tissue surrounding it. No evidence of drainage, warmth, or redness. L calf slightly edematous as patient reporting prolonged standing d/t "working in the house" but without tenderness. Patient was able to demonstrate L knee AROM 2-93 degrees and PROM 1-97 degrees PROM today after ther-ex. Able to demonstrate an alternating reciprocal pattern when navigating stairs today for the first time, but with anterior trunk flexion when ascending and poor quad control when descending. Encouraged patient to elevate L LE after long durations of sitting or standing to assist with edema management. Patient reported understanding and without complaints at end of session. Patient is progressing well but with remaining limitation in L knee flexion which is causing patient to ambulate and navigate stairs with gait deviations. Plan  to progress per POC.    Comorbidities DM II, HTN, angina, anemia, anterior cervical fusion, L heart cath & angio 2015    Rehab Potential Good    PT Frequency 2x / week    PT Duration 6 weeks    PT Treatment/Interventions ADLs/Self Care Home Management;Cryotherapy;Electrical Stimulation;Moist Heat;Therapeutic exercise;Therapeutic activities;Functional mobility training;Ultrasound;Neuromuscular re-education;Patient/family education;Manual techniques;Taping;Energy conservation;Dry needling;Balance training;Stair training;Gait training;Vasopneumatic Device;Passive range of motion;Scar mobilization    PT Next Visit Plan UE strength, palpation of cervical spine/musculature    Consulted and Agree with Plan of Care Patient    Family Member Consulted sister           Patient will benefit from skilled therapeutic intervention in order to improve the following deficits and impairments:  Increased edema, Decreased activity tolerance, Decreased strength, Increased fascial restricitons, Pain, Impaired UE functional use, Increased muscle spasms, Improper body mechanics, Decreased range of motion, Impaired flexibility, Postural dysfunction, Abnormal gait, Hypomobility, Decreased scar mobility,  Decreased balance, Difficulty walking  Visit Diagnosis: Acute pain of left knee  Stiffness of left knee, not elsewhere classified  Cervicalgia  Muscle weakness (generalized)  Difficulty in walking, not elsewhere classified     Problem List Patient Active Problem List   Diagnosis Date Noted  . 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 Acute bronchitis 08/10/2014  . Unstable angina ([place]) 05/29/2014  . Abnormal nuclear stress test 05/29/2014  . Chest pain 05/06/2014  . Exertional shortness of breath 05/06/2014  . Mixed hyperlipidemia 05/06/2014  . Hyperlipidemia with target LDL less than 70 08/05/2013  . HTN (hypertension) 08/05/2013  . Chest wall pain 08/05/2013  . SINUSITIS, ACUTE 04/07/2009  . HYPOTHYROIDISM  12/10/2007  . DIABETES MELLITUS, TYPE II 05/28/2007  . Obstructive sleep apnea 05/28/2007  . Coronary atherosclerosis 05/28/2007  . Seasonal and perennial allergic rhinitis 05/28/2007  . COUGH, CHRONIC 05/28/2007     Janene Harvey, PT, DPT 03/10/20 5:30 PM   [facility] [address]  [place], [name], [address] Phone: [phone]   Fax:  [phone]  [name] MRN: [id number] Date of Birth: 06-Jun-1956

## 2020-03-11 ENCOUNTER — Telehealth: Payer: Self-pay | Admitting: Critical Care Medicine

## 2020-03-11 NOTE — Telephone Encounter (Signed)
LMTCB x1 for pt. When she calls back, please schedule in any open slot that is convenient for the pt.

## 2020-03-12 ENCOUNTER — Ambulatory Visit: Payer: 59

## 2020-03-12 NOTE — Telephone Encounter (Signed)
Pt rescheduled 8/11 at 3pm

## 2020-03-12 NOTE — Telephone Encounter (Signed)
LMTCB x2 for pt 

## 2020-03-13 ENCOUNTER — Ambulatory Visit: Payer: 59

## 2020-03-15 ENCOUNTER — Other Ambulatory Visit: Payer: Self-pay | Admitting: Cardiovascular Disease

## 2020-03-17 ENCOUNTER — Other Ambulatory Visit: Payer: Self-pay

## 2020-03-17 ENCOUNTER — Ambulatory Visit: Payer: 59

## 2020-03-17 DIAGNOSIS — M25562 Pain in left knee: Secondary | ICD-10-CM

## 2020-03-17 DIAGNOSIS — R262 Difficulty in walking, not elsewhere classified: Secondary | ICD-10-CM

## 2020-03-17 DIAGNOSIS — M542 Cervicalgia: Secondary | ICD-10-CM

## 2020-03-17 DIAGNOSIS — M6281 Muscle weakness (generalized): Secondary | ICD-10-CM

## 2020-03-17 DIAGNOSIS — M25662 Stiffness of left knee, not elsewhere classified: Secondary | ICD-10-CM

## 2020-03-17 NOTE — Therapy (Signed)
[facility] [address]  [place], [name], [address] Phone: [phone]   Fax:  [phone]  Physical Therapy Treatment  Patient Details  [name] MRN: [id number] Date of Birth: Dec 02, 1955 Referring Provider (PT): Susa Day, MD   Encounter Date: 03/17/2020   PT End of Session - 03/17/20 1557    Visit Number 7    Number of Visits 13    Date for PT Re-Evaluation 04/01/20    Authorization Type [employer] & [employer]    PT Start Time 8563    PT Stop Time 1625    PT Time Calculation (min) 54 min    Activity Tolerance Patient tolerated treatment well;Patient limited by pain    Behavior During Therapy [facility] for tasks assessed/performed           Past Medical History:  Diagnosis Date  . Acute bronchitis   . Anemia    hx of anemia  . Anginal pain ([place])   . Arthritis    osteo  . Asthma   . Coronary atherosclerosis   . Cough   . GERD (gastroesophageal reflux disease)   . History of colon polyps   . History of migraine   . Hyperlipidemia   . Hypertension   . Obstructive sleep apnea (adult) (pediatric)    NO CPAP   . Peripheral neuropathy   . Type II or unspecified type diabetes mellitus without mention of complication, not stated as uncontrolled    type 2  . Unspecified hypothyroidism     Past Surgical History:  Procedure Laterality Date  . ANTERIOR FUSION CERVICAL SPINE    . CARPAL TUNNEL RELEASE Left   . COLONOSCOPY    . CORONARY ANGIOPLASTY    . LEFT HEART CATHETERIZATION WITH CORONARY ANGIOGRAM N/A 05/29/2014   Procedure: LEFT HEART CATHETERIZATION WITH CORONARY ANGIOGRAM;  Surgeon: Sinclair Grooms, MD;  Location: [facility] CATH LAB;  Service: Cardiovascular;  Laterality: N/A;  . 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   Procedure: TOTAL KNEE ARTHROPLASTY;  Surgeon: Susa Day, MD;  Location: [facility];  Service: Orthopedics;  Laterality: Left;  3 hrs  . TUBAL LIGATION    . WISDOM TOOTH  EXTRACTION      There were no vitals filed for this visit.   Subjective Assessment - 03/17/20 1549    Subjective Doing ok.  Feels her L knee wound is healing well.    Pertinent History DM II, HTN, angina, anemia, anterior cervical fusion, L heart cath & angio 2015    Diagnostic tests none recent    Patient Stated Goals get rid of pain    Currently in Pain? No/denies    Pain Score 0-No pain    Pain Location Knee              [facility] PT Assessment - 03/17/20 0001      AROM   Right/Left Knee Left    Left Knee Flexion 101                         [facility] Adult PT Treatment/Exercise - 03/17/20 0001      Knee/Hip Exercises: Stretches   Hip Flexor Stretch Left;30 seconds;2 reps    Hip Flexor Stretch Limitations mod thomas with strap    Knee: Self-Stretch to increase Flexion Left    Knee: Self-Stretch Limitations 5" x 10 reps foot on TM lunge flexion stretch       Knee/Hip  Exercises: Aerobic   Nustep Lvl 3, 7 min (UE/LE)      Knee/Hip Exercises: Standing   Forward Step Up Right;Left;Step Height: 6";10 reps    Functional Squat 10 reps;3 seconds    Functional Squat Limitations TRX    Wall Squat 10 reps;3 seconds    Wall Squat Limitations cues for even weight shift       Knee/Hip Exercises: Supine   Bridges Both;10 reps;Strengthening      Vasopneumatic   Number Minutes Vasopneumatic  10 minutes    Vasopnuematic Location  Knee   L   Vasopneumatic Pressure Low    Vasopneumatic Temperature  coldest temp.                    PT Short Term Goals - 03/03/20 1558      PT SHORT TERM GOAL #1   Title Patient to be independent with initial HEP.    Time 3    Period Weeks    Status Achieved    Target Date 03/11/20             PT Long Term Goals - 03/10/20 1726      PT LONG TERM GOAL #1   Title Patient to be independent with advanced HEP.    Time 6    Period Weeks    Status Partially Met   met for current     PT LONG TERM GOAL #2   Title Patient  to demonstrate cervical AROM WFL and without pain limiting.    Time 6    Status On-going      PT LONG TERM GOAL #3   Title Patient to demonstrate R UE strength >/=4+/5 and symmetrical grip strength.    Time 6    Period Weeks    Status On-going      PT LONG TERM GOAL #4   Title Patient to report 90% improvement in driving tolerance in order to return to work pain-free.    Time 6    Period Weeks    Status Partially Met   03/03/20:  pt. noting 60-70% improvement     PT LONG TERM GOAL #5   Title Patient to demonstrate L knee AROM 0-120 degrees.    Time 6    Period Weeks    Status On-going   03/10/20 L knee AROM 2-93 degrees and PROM 1-97 degrees     PT LONG TERM GOAL #6   Title Patient to demonstrate B LE strength >/=4+/5.    Time 6    Period Weeks    Status Partially Met   03/10/20: improved in B hip flexion, R hip adduction, L knee flexion, B ankle DF/PF     PT LONG TERM GOAL #7   Title Patient to demonstrate reciprocal stair climbing up/down 13 stairs with 1 handrail as needed with good stability and <3/10 pain.    Time 6    Period Weeks    Status Partially Met   able to demonstrate reciprocal alternating pattern but with poor eccentric control and c/o B knee pain                Plan - 03/17/20 1558    Clinical Impression Statement [name] doing well with HEP performance per report.  Visible improvement in L knee flexion ROM. Tolerated progression of squatting and wall squatting activities.  Pt. notes her UE/neck has been feeling ok over the past few weeks not as much of a concern for her as  the knee.  Ended visit with ice/compression to knee to reduce pain and post-exercise swelling.  Pt. noting her knee wound monitored by MD has continued to heal.    Comorbidities DM II, HTN, angina, anemia, anterior cervical fusion, L heart cath & angio 2015    Rehab Potential Good    PT Frequency 2x / week    PT Treatment/Interventions ADLs/Self Care Home  Management;Cryotherapy;Electrical Stimulation;Moist Heat;Therapeutic exercise;Therapeutic activities;Functional mobility training;Ultrasound;Neuromuscular re-education;Patient/family education;Manual techniques;Taping;Energy conservation;Dry needling;Balance training;Stair training;Gait training;Vasopneumatic Device;Passive range of motion;Scar mobilization    Consulted and Agree with Plan of Care Patient           Patient will benefit from skilled therapeutic intervention in order to improve the following deficits and impairments:  Increased edema, Decreased activity tolerance, Decreased strength, Increased fascial restricitons, Pain, Impaired UE functional use, Increased muscle spasms, Improper body mechanics, Decreased range of motion, Impaired flexibility, Postural dysfunction, Abnormal gait, Hypomobility, Decreased scar mobility, Decreased balance, Difficulty walking  Visit Diagnosis: Acute pain of left knee  Stiffness of left knee, not elsewhere classified  Cervicalgia  Muscle weakness (generalized)  Difficulty in walking, not elsewhere classified     Problem List Patient Active Problem List   Diagnosis Date Noted  . 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 Acute bronchitis 08/10/2014  . Unstable angina ([place]) 05/29/2014  . Abnormal nuclear stress test 05/29/2014  . Chest pain 05/06/2014  . Exertional shortness of breath 05/06/2014  . Mixed hyperlipidemia 05/06/2014  . Hyperlipidemia with target LDL less than 70 08/05/2013  . HTN (hypertension) 08/05/2013  . Chest wall pain 08/05/2013  . SINUSITIS, ACUTE 04/07/2009  . HYPOTHYROIDISM 12/10/2007  . DIABETES MELLITUS, TYPE II 05/28/2007  . Obstructive sleep apnea 05/28/2007  . Coronary atherosclerosis 05/28/2007  . Seasonal and perennial allergic rhinitis 05/28/2007  . COUGH, CHRONIC 05/28/2007    Bess Harvest, PTA 03/17/20 5:32 PM   [facility] [address]  [place], [name], [address] Phone: [phone]   Fax:  [phone]  [name] MRN: [id number] Date of Birth: [birth year]

## 2020-03-18 ENCOUNTER — Ambulatory Visit (INDEPENDENT_AMBULATORY_CARE_PROVIDER_SITE_OTHER): Payer: 59

## 2020-03-18 DIAGNOSIS — J4550 Severe persistent asthma, uncomplicated: Secondary | ICD-10-CM

## 2020-03-18 MED ORDER — MEPOLIZUMAB 100 MG ~~LOC~~ SOLR
100.0000 mg | SUBCUTANEOUS | Status: DC
  Administered 2020-03-18: 100 mg via SUBCUTANEOUS

## 2020-03-18 NOTE — Progress Notes (Signed)
All questions were answered by the patient before medication was administered. Have you been hospitalized in the last 10 days? No Do you have a fever? No Do you have a cough? No Do you have a headache or sore throat? No  

## 2020-03-19 ENCOUNTER — Other Ambulatory Visit: Payer: Self-pay

## 2020-03-19 ENCOUNTER — Ambulatory Visit: Payer: 59 | Admitting: Physical Therapy

## 2020-03-19 ENCOUNTER — Encounter: Payer: Self-pay | Admitting: Physical Therapy

## 2020-03-19 DIAGNOSIS — M542 Cervicalgia: Secondary | ICD-10-CM

## 2020-03-19 DIAGNOSIS — M6281 Muscle weakness (generalized): Secondary | ICD-10-CM

## 2020-03-19 DIAGNOSIS — R29898 Other symptoms and signs involving the musculoskeletal system: Secondary | ICD-10-CM

## 2020-03-19 DIAGNOSIS — M25662 Stiffness of left knee, not elsewhere classified: Secondary | ICD-10-CM

## 2020-03-19 DIAGNOSIS — R262 Difficulty in walking, not elsewhere classified: Secondary | ICD-10-CM

## 2020-03-19 DIAGNOSIS — M25562 Pain in left knee: Secondary | ICD-10-CM | POA: Diagnosis not present

## 2020-03-19 NOTE — Therapy (Signed)
[facility] [address]  [address] [place], [place], [address] Phone: [phone]   Fax:  [phone]  Physical Therapy Treatment  Patient Details  [name] MRN: [id number] Date of Birth: [birth year] Referring Provider (PT): Jene Every, MD   Encounter Date: 03/19/2020   PT End of Session - 03/19/20 1715    Visit Number 8    Number of Visits 13    Date for PT Re-Evaluation 04/01/20    Authorization Type [employer] & [employer]    PT Start Time 1536    PT Stop Time 1621    PT Time Calculation (min) 45 min    Activity Tolerance Patient tolerated treatment well    Behavior During Therapy [facility] for tasks assessed/performed           Past Medical History:  Diagnosis Date  . Acute bronchitis   . Anemia    hx of anemia  . Anginal pain ([facility])   . Arthritis    osteo  . Asthma   . Coronary atherosclerosis   . Cough   . GERD (gastroesophageal reflux disease)   . History of colon polyps   . History of migraine   . Hyperlipidemia   . Hypertension   . Obstructive sleep apnea (adult) (pediatric)    NO CPAP   . Peripheral neuropathy   . Type II or unspecified type diabetes mellitus without mention of complication, not stated as uncontrolled    type 2  . Unspecified hypothyroidism     Past Surgical History:  Procedure Laterality Date  . ANTERIOR FUSION CERVICAL SPINE    . CARPAL TUNNEL RELEASE Left   . COLONOSCOPY    . CORONARY ANGIOPLASTY    . LEFT HEART CATHETERIZATION WITH CORONARY ANGIOGRAM N/A 05/29/2014   Procedure: LEFT HEART CATHETERIZATION WITH CORONARY ANGIOGRAM;  Surgeon: Lesleigh Noe, MD;  Location: [facility] CATH LAB;  Service: Cardiovascular;  Laterality: N/A;  . 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   Procedure: TOTAL KNEE ARTHROPLASTY;  Surgeon: Jene Every, MD;  Location: [facility];  Service: Orthopedics;  Laterality: Left;  3 hrs  . TUBAL LIGATION    . WISDOM TOOTH EXTRACTION      There were  no vitals filed for this visit.   Subjective Assessment - 03/19/20 1539    Subjective Having some pain in the L thigh and shin today- may be d/t being on her feet a lot today. Took some Tylenol which improved it.    Pertinent History DM II, HTN, angina, anemia, anterior cervical fusion, L heart cath & angio 2015    Diagnostic tests none recent    Patient Stated Goals get rid of pain    Currently in Pain? No/denies              [facility] PT Assessment - 03/19/20 0001      AROM   Left Knee Flexion 103      PROM   Left Knee Flexion 107                         [facility] Adult PT Treatment/Exercise - 03/19/20 0001      Knee/Hip Exercises: Stretches   Hip Flexor Stretch Left;30 seconds;2 reps    Hip Flexor Stretch Limitations mod thomas with strap      Knee/Hip Exercises: Aerobic   Recumbent Bike lvl 1, 6 in - partial revolutions       Knee/Hip Exercises:  Standing   Forward Step Up Left;Step Height: 6";1 set;15 reps;Hand Hold: 1    Forward Step Up Limitations L step up/over   cues for decreased eccentric speed     Vasopneumatic   Number Minutes Vasopneumatic  10 minutes    Vasopnuematic Location  Knee   L   Vasopneumatic Pressure Low    Vasopneumatic Temperature  coldest temp.      Manual Therapy   Manual Therapy Joint mobilization    Manual therapy comments supine    Joint Mobilization L patellar mobs in M/L and superior/inferior directions grade III/IV to tolerance, L knee distraction + knee flexion seat belt mobs grade IV to tolerance   careful to avoid pressure over wound                   PT Short Term Goals - 03/03/20 1558      PT SHORT TERM GOAL #1   Title Patient to be independent with initial HEP.    Time 3    Period Weeks    Status Achieved    Target Date 03/11/20             PT Long Term Goals - 03/10/20 1726      PT LONG TERM GOAL #1   Title Patient to be independent with advanced HEP.    Time 6    Period Weeks    Status  Partially Met   met for current     PT LONG TERM GOAL #2   Title Patient to demonstrate cervical AROM WFL and without pain limiting.    Time 6    Status On-going      PT LONG TERM GOAL #3   Title Patient to demonstrate R UE strength >/=4+/5 and symmetrical grip strength.    Time 6    Period Weeks    Status On-going      PT LONG TERM GOAL #4   Title Patient to report 90% improvement in driving tolerance in order to return to work pain-free.    Time 6    Period Weeks    Status Partially Met   03/03/20:  pt. noting 60-70% improvement     PT LONG TERM GOAL #5   Title Patient to demonstrate L knee AROM 0-120 degrees.    Time 6    Period Weeks    Status On-going   03/10/20 L knee AROM 2-93 degrees and PROM 1-97 degrees     PT LONG TERM GOAL #6   Title Patient to demonstrate B LE strength >/=4+/5.    Time 6    Period Weeks    Status Partially Met   03/10/20: improved in B hip flexion, R hip adduction, L knee flexion, B ankle DF/PF     PT LONG TERM GOAL #7   Title Patient to demonstrate reciprocal stair climbing up/down 13 stairs with 1 handrail as needed with good stability and <3/10 pain.    Time 6    Period Weeks    Status Partially Met   able to demonstrate reciprocal alternating pattern but with poor eccentric control and c/o B knee pain                Plan - 03/19/20 1716    Clinical Impression Statement Patient reporting some L thigh and shin pain today which was relieved with Tylenol. Worked on L knee ROM today with manual therapy and stretching. Patient tolerated L patellar and tibiofemoral mobilizations with good tolerance. Was  able to reach L knee AROM flexion to 103 degrees, PROM to 107 degrees after MT. Proceeded with LE stretching to further increase ROM. Patient demonstrated improved control with anterior step up/downs with mild hesitation demonstrated upon step down. Ended session with [place] to L knee for post-exercise soreness. No complaints at end of  session. Patient progressing towards goals.    Comorbidities DM II, HTN, angina, anemia, anterior cervical fusion, L heart cath & angio 2015    Rehab Potential Good    PT Frequency 2x / week    PT Treatment/Interventions ADLs/Self Care Home Management;Cryotherapy;Electrical Stimulation;Moist Heat;Therapeutic exercise;Therapeutic activities;Functional mobility training;Ultrasound;Neuromuscular re-education;Patient/family education;Manual techniques;Taping;Energy conservation;Dry needling;Balance training;Stair training;Gait training;Vasopneumatic Device;Passive range of motion;Scar mobilization    PT Next Visit Plan progress L knee flexion ROM, jt mobs, step up/downs    Consulted and Agree with Plan of Care Patient           Patient will benefit from skilled therapeutic intervention in order to improve the following deficits and impairments:  Increased edema, Decreased activity tolerance, Decreased strength, Increased fascial restricitons, Pain, Impaired UE functional use, Increased muscle spasms, Improper body mechanics, Decreased range of motion, Impaired flexibility, Postural dysfunction, Abnormal gait, Hypomobility, Decreased scar mobility, Decreased balance, Difficulty walking  Visit Diagnosis: Acute pain of left knee  Stiffness of left knee, not elsewhere classified  Cervicalgia  Muscle weakness (generalized)  Difficulty in walking, not elsewhere classified  Other symptoms and signs involving the musculoskeletal system     Problem List Patient Active Problem List   Diagnosis Date Noted  . 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 Acute bronchitis 08/10/2014  . Unstable angina ([place]) 05/29/2014  . Abnormal nuclear stress test 05/29/2014  . Chest pain 05/06/2014  . Exertional shortness of breath 05/06/2014  . Mixed hyperlipidemia 05/06/2014  . Hyperlipidemia with target LDL less than 70 08/05/2013  . HTN (hypertension) 08/05/2013  . Chest wall pain 08/05/2013  . SINUSITIS, ACUTE  04/07/2009  . HYPOTHYROIDISM 12/10/2007  . DIABETES MELLITUS, TYPE II 05/28/2007  . Obstructive sleep apnea 05/28/2007  . Coronary atherosclerosis 05/28/2007  . Seasonal and perennial allergic rhinitis 05/28/2007  . COUGH, CHRONIC 05/28/2007     [name], PT, DPT 03/19/20 5:21 PM   [facility] [address]  [place], [name], [address] Phone: [phone]   Fax:  [phone]  [name] MRN: [id number] Date of Birth: 04/02/1956

## 2020-03-24 ENCOUNTER — Ambulatory Visit: Payer: 59

## 2020-03-24 ENCOUNTER — Other Ambulatory Visit: Payer: Self-pay

## 2020-03-24 DIAGNOSIS — M25662 Stiffness of left knee, not elsewhere classified: Secondary | ICD-10-CM

## 2020-03-24 DIAGNOSIS — M25562 Pain in left knee: Secondary | ICD-10-CM

## 2020-03-24 DIAGNOSIS — M6281 Muscle weakness (generalized): Secondary | ICD-10-CM

## 2020-03-24 DIAGNOSIS — M542 Cervicalgia: Secondary | ICD-10-CM

## 2020-03-24 DIAGNOSIS — R262 Difficulty in walking, not elsewhere classified: Secondary | ICD-10-CM

## 2020-03-24 DIAGNOSIS — R29898 Other symptoms and signs involving the musculoskeletal system: Secondary | ICD-10-CM

## 2020-03-24 NOTE — Therapy (Signed)
[facility] [address]  [place], [name], [address] Phone: [phone]   Fax:  [phone]  Physical Therapy Treatment  Patient Details  [name] MRN: [id number] Date of Birth: March 16, 1956 Referring Provider (PT): Susa Day, MD   Encounter Date: 03/24/2020   PT End of Session - 03/24/20 1537    Visit Number 9    Number of Visits 13    Date for PT Re-Evaluation 04/01/20    Authorization Type [employer] & [employer]    PT Start Time 5681    PT Stop Time 1610    PT Time Calculation (min) 39 min    Activity Tolerance Patient tolerated treatment well    Behavior During Therapy [facility] for tasks assessed/performed           Past Medical History:  Diagnosis Date  . Acute bronchitis   . Anemia    hx of anemia  . Anginal pain ([place])   . Arthritis    osteo  . Asthma   . Coronary atherosclerosis   . Cough   . GERD (gastroesophageal reflux disease)   . History of colon polyps   . History of migraine   . Hyperlipidemia   . Hypertension   . Obstructive sleep apnea (adult) (pediatric)    NO CPAP   . Peripheral neuropathy   . Type II or unspecified type diabetes mellitus without mention of complication, not stated as uncontrolled    type 2  . Unspecified hypothyroidism     Past Surgical History:  Procedure Laterality Date  . ANTERIOR FUSION CERVICAL SPINE    . CARPAL TUNNEL RELEASE Left   . COLONOSCOPY    . CORONARY ANGIOPLASTY    . LEFT HEART CATHETERIZATION WITH CORONARY ANGIOGRAM N/A 05/29/2014   Procedure: LEFT HEART CATHETERIZATION WITH CORONARY ANGIOGRAM;  Surgeon: Sinclair Grooms, MD;  Location: [facility] CATH LAB;  Service: Cardiovascular;  Laterality: N/A;  . 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   Procedure: TOTAL KNEE ARTHROPLASTY;  Surgeon: Susa Day, MD;  Location: [facility] ORS;  Service: Orthopedics;  Laterality: Left;  3 hrs  . TUBAL LIGATION    . WISDOM TOOTH EXTRACTION      There were  no vitals filed for this visit.   Subjective Assessment - 03/24/20 1536    Subjective Pt. noting, "my R knee is giving me more problems than my left".    Pertinent History DM II, HTN, angina, anemia, anterior cervical fusion, L heart cath & angio 2015    Diagnostic tests none recent    Patient Stated Goals get rid of pain    Currently in Pain? No/denies    Pain Score 0-No pain    Pain Location Knee    Pain Orientation Left              [facility] PT Assessment - 03/24/20 0001      AROM   AROM Assessment Site Cervical    Cervical Flexion 42    Cervical Extension 45    Cervical - Right Side Bend 25    Cervical - Left Side Bend 35    Cervical - Right Rotation 45    Cervical - Left Rotation 58                         [facility] Adult PT Treatment/Exercise - 03/24/20 0001      Knee/Hip Exercises: Stretches   Hip Flexor Stretch Left;30  seconds;1 rep    Hip Flexor Stretch Limitations mod thomas with strap      Knee/Hip Exercises: Aerobic   Recumbent Bike lvl 1, 6 in - partial revolutions, full revolutions       Knee/Hip Exercises: Standing   Heel Raises Both;20 reps    Heel Raises Limitations counter     Knee Flexion Right;Left;10 reps;Strengthening    Knee Flexion Limitations 2#    Hip Flexion Right;Left;10 reps;Knee bent;Stengthening    Hip Flexion Limitations counter - 2#     Hip Abduction Right;Left;10 reps;Knee straight;Stengthening    Abduction Limitations 2# - counter     Hip Extension Right;Left;10 reps;Knee straight;Stengthening    Extension Limitations 2# - 45 dg kickback      Shoulder Exercises: Seated   Flexion Both;10 reps;Weights;Strengthening    Flexion Weight (lbs) 2    Abduction Both;10 reps;Weights;Strengthening    ABduction Weight (lbs) 2    ABduction Limitations scaption       Neck Exercises: Stretches   Upper Trapezius Stretch Right;Left;1 rep;30 seconds    Upper Trapezius Stretch Limitations B UT                    PT Short  Term Goals - 03/03/20 1558      PT SHORT TERM GOAL #1   Title Patient to be independent with initial HEP.    Time 3    Period Weeks    Status Achieved    Target Date 03/11/20             PT Long Term Goals - 03/10/20 1726      PT LONG TERM GOAL #1   Title Patient to be independent with advanced HEP.    Time 6    Period Weeks    Status Partially Met   met for current     PT LONG TERM GOAL #2   Title Patient to demonstrate cervical AROM WFL and without pain limiting.    Time 6    Status On-going      PT LONG TERM GOAL #3   Title Patient to demonstrate R UE strength >/=4+/5 and symmetrical grip strength.    Time 6    Period Weeks    Status On-going      PT LONG TERM GOAL #4   Title Patient to report 90% improvement in driving tolerance in order to return to work pain-free.    Time 6    Period Weeks    Status Partially Met   03/03/20:  pt. noting 60-70% improvement     PT LONG TERM GOAL #5   Title Patient to demonstrate L knee AROM 0-120 degrees.    Time 6    Period Weeks    Status On-going   03/10/20 L knee AROM 2-93 degrees and PROM 1-97 degrees     PT LONG TERM GOAL #6   Title Patient to demonstrate B LE strength >/=4+/5.    Time 6    Period Weeks    Status Partially Met   03/10/20: improved in B hip flexion, R hip adduction, L knee flexion, B ankle DF/PF     PT LONG TERM GOAL #7   Title Patient to demonstrate reciprocal stair climbing up/down 13 stairs with 1 handrail as needed with good stability and <3/10 pain.    Time 6    Period Weeks    Status Partially Met   able to demonstrate reciprocal alternating pattern but with poor eccentric control  and c/o B knee pain                Plan - 03/24/20 1804    Clinical Impression Statement [name] doing well.  Will be seeing MD tomorrow for f/u regarding her L knee wound.  Tolerated progression of shoulder strengthening and LE strengthening activities well today.  Seems to be ambulating with improved weight  shift in session.  Will plan to initiate step-ups/downs and continue MT to improve knee ROM in coming sessions.    Comorbidities DM II, HTN, angina, anemia, anterior cervical fusion, L heart cath & angio 2015    Rehab Potential Good    PT Frequency 2x / week    PT Duration 6 weeks    PT Treatment/Interventions ADLs/Self Care Home Management;Cryotherapy;Electrical Stimulation;Moist Heat;Therapeutic exercise;Therapeutic activities;Functional mobility training;Ultrasound;Neuromuscular re-education;Patient/family education;Manual techniques;Taping;Energy conservation;Dry needling;Balance training;Stair training;Gait training;Vasopneumatic Device;Passive range of motion;Scar mobilization    PT Next Visit Plan progress L knee flexion ROM, jt mobs, step up/downs    Consulted and Agree with Plan of Care Patient    Family Member Consulted sister           Patient will benefit from skilled therapeutic intervention in order to improve the following deficits and impairments:  Increased edema, Decreased activity tolerance, Decreased strength, Increased fascial restricitons, Pain, Impaired UE functional use, Increased muscle spasms, Improper body mechanics, Decreased range of motion, Impaired flexibility, Postural dysfunction, Abnormal gait, Hypomobility, Decreased scar mobility, Decreased balance, Difficulty walking  Visit Diagnosis: Acute pain of left knee  Stiffness of left knee, not elsewhere classified  Cervicalgia  Muscle weakness (generalized)  Difficulty in walking, not elsewhere classified  Other symptoms and signs involving the musculoskeletal system     Problem List Patient Active Problem List   Diagnosis Date Noted  . 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 Acute bronchitis 08/10/2014  . Unstable angina ([place]) 05/29/2014  . Abnormal nuclear stress test 05/29/2014  . Chest pain 05/06/2014  . Exertional shortness of breath 05/06/2014  . Mixed hyperlipidemia 05/06/2014  . Hyperlipidemia with  target LDL less than 70 08/05/2013  . HTN (hypertension) 08/05/2013  . Chest wall pain 08/05/2013  . SINUSITIS, ACUTE 04/07/2009  . HYPOTHYROIDISM 12/10/2007  . DIABETES MELLITUS, TYPE II 05/28/2007  . Obstructive sleep apnea 05/28/2007  . Coronary atherosclerosis 05/28/2007  . Seasonal and perennial allergic rhinitis 05/28/2007  . COUGH, CHRONIC 05/28/2007    Bess Harvest, PTA 03/24/20 6:08 PM   [facility] [address]  [place], [name], [address] Phone: [phone]   Fax:  [phone]  [name] MRN: [id number] Date of Birth: March 08, 1956

## 2020-03-26 ENCOUNTER — Ambulatory Visit: Payer: 59 | Admitting: Physical Therapy

## 2020-03-26 ENCOUNTER — Encounter: Payer: Self-pay | Admitting: Physical Therapy

## 2020-03-26 ENCOUNTER — Other Ambulatory Visit: Payer: Self-pay

## 2020-03-26 DIAGNOSIS — R262 Difficulty in walking, not elsewhere classified: Secondary | ICD-10-CM

## 2020-03-26 DIAGNOSIS — M25562 Pain in left knee: Secondary | ICD-10-CM

## 2020-03-26 DIAGNOSIS — M542 Cervicalgia: Secondary | ICD-10-CM

## 2020-03-26 DIAGNOSIS — R29898 Other symptoms and signs involving the musculoskeletal system: Secondary | ICD-10-CM

## 2020-03-26 DIAGNOSIS — M6281 Muscle weakness (generalized): Secondary | ICD-10-CM

## 2020-03-26 DIAGNOSIS — M25662 Stiffness of left knee, not elsewhere classified: Secondary | ICD-10-CM

## 2020-03-26 NOTE — Therapy (Signed)
[facility] [address]  [place], [name], [address] Phone: [phone]   Fax:  [phone]  Physical Therapy Progress Note  Patient Details  [name] MRN: [id number] Date of Birth: 09-10-1955 Referring Provider (PT): Susa Day, MD  Progress Note Reporting Period 02/19/20 to 03/26/20  See note below for Objective Data and Assessment of Progress/Goals.     Encounter Date: 03/26/2020   PT End of Session - 03/26/20 1709    Visit Number 10    Number of Visits 22    Date for PT Re-Evaluation 05/07/20    Authorization Type [employer] & [employer]    PT Start Time 1532    PT Stop Time 1610    PT Time Calculation (min) 38 min    Activity Tolerance Patient tolerated treatment well    Behavior During Therapy WFL for tasks assessed/performed           Past Medical History:  Diagnosis Date  . Acute bronchitis   . Anemia    hx of anemia  . Anginal pain ([place])   . Arthritis    osteo  . Asthma   . Coronary atherosclerosis   . Cough   . GERD (gastroesophageal reflux disease)   . History of colon polyps   . History of migraine   . Hyperlipidemia   . Hypertension   . Obstructive sleep apnea (adult) (pediatric)    NO CPAP   . Peripheral neuropathy   . Type II or unspecified type diabetes mellitus without mention of complication, not stated as uncontrolled    type 2  . Unspecified hypothyroidism     Past Surgical History:  Procedure Laterality Date  . ANTERIOR FUSION CERVICAL SPINE    . CARPAL TUNNEL RELEASE Left   . COLONOSCOPY    . CORONARY ANGIOPLASTY    . LEFT HEART CATHETERIZATION WITH CORONARY ANGIOGRAM N/A 05/29/2014   Procedure: LEFT HEART CATHETERIZATION WITH CORONARY ANGIOGRAM;  Surgeon: Sinclair Grooms, MD;  Location: [facility] CATH LAB;  Service: Cardiovascular;  Laterality: N/A;  . 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   Procedure: TOTAL KNEE ARTHROPLASTY;  Surgeon: Susa Day, MD;   Location: [facility];  Service: Orthopedics;  Laterality: Left;  3 hrs  . TUBAL LIGATION    . WISDOM TOOTH EXTRACTION      There were no vitals filed for this visit.   Subjective Assessment - 03/26/20 1537    Subjective Went to see Dr. Tonita Cong recently who did some debridement on the wound and applied silver nitrate.    Pertinent History DM II, HTN, angina, anemia, anterior cervical fusion, L heart cath & angio 2015    Diagnostic tests none recent    Patient Stated Goals get rid of pain    Currently in Pain? No/denies              [facility] PT Assessment - 03/26/20 0001      Assessment   Medical Diagnosis Aftercare following joint replacement surgery    Referring Provider (PT) Susa Day, MD    Onset Date/Surgical Date 02/05/20      AROM   Left Knee Extension 1    Left Knee Flexion 104    Cervical Flexion 42    Cervical Extension 45    Cervical - Right Side Bend 25    Cervical - Left Side Bend 35    Cervical - Right Rotation 45    Cervical - Left Rotation 58  PROM   Left Knee Extension 1    Left Knee Flexion 107      Strength   Right Shoulder Flexion 4+/5    Right Shoulder ABduction 4+/5    Right Shoulder Internal Rotation 4+/5    Right Shoulder External Rotation 4/5    Left Shoulder Flexion 4/5    Left Shoulder ABduction 4/5    Left Shoulder Internal Rotation 4+/5    Left Shoulder External Rotation 4/5    Right Elbow Flexion 4+/5    Right Elbow Extension 4+/5    Left Elbow Flexion 4+/5    Left Elbow Extension 4+/5    Right Hand Grip (lbs) 19   18, 19, 20   Left Hand Grip (lbs) 24.67   15, 20, 24   Right Hip Flexion 5/5    Right Hip ABduction 4+/5    Right Hip ADduction 4+/5    Left Hip Flexion 4+/5    Left Hip ABduction 4+/5    Left Hip ADduction 4+/5    Right Knee Flexion 4+/5    Right Knee Extension 4+/5    Left Knee Flexion 4+/5    Left Knee Extension 4/5    Right Ankle Dorsiflexion 4+/5    Right Ankle Plantar Flexion 4+/5    Left Ankle Dorsiflexion  4+/5    Left Ankle Plantar Flexion 4+/5                         [facility] Adult PT Treatment/Exercise - 03/26/20 0001      Ambulation/Gait   Stairs Yes    Stairs Assistance 7: Independent    Stair Management Technique One rail Right    Number of Stairs 13    Height of Stairs 8    Gait Comments anterior trunk lean ascending and decreased eccentric control when descending on R LE, but improved      Knee/Hip Exercises: Stretches   Hip Flexor Stretch Left;30 seconds;2 reps    Hip Flexor Stretch Limitations mod thomas with strap      Knee/Hip Exercises: Aerobic   Recumbent Bike lvl 1, 6 in - full revolutions       Knee/Hip Exercises: Standing   Terminal Knee Extension Strengthening;Left;1 set;10 reps    Theraband Level (Terminal Knee Extension) Level 4 (Blue)    Terminal Knee Extension Limitations at TM rail 10x3"      Manual Therapy   Manual Therapy Joint mobilization    Manual therapy comments supine    Joint Mobilization L patellar mobs in M/L and superior/inferior directions grade III/IV to tolerance                  PT Education - 03/26/20 1709    Education Details update to HEP; discussion on objecitve progress and remaining impairments    Person(s) Educated Patient    Methods Explanation;Demonstration;Tactile cues;Verbal cues;Handout    Comprehension Verbalized understanding;Returned demonstration            PT Short Term Goals - 03/03/20 1558      PT SHORT TERM GOAL #1   Title Patient to be independent with initial HEP.    Time 3    Period Weeks    Status Achieved    Target Date 03/11/20             PT Long Term Goals - 03/26/20 1559      PT LONG TERM GOAL #1   Title Patient to be independent with advanced HEP.  Time 6    Period Weeks    Status Partially Met   met for current   Target Date 05/07/20      PT LONG TERM GOAL #2   Title Patient to demonstrate cervical AROM WFL and without pain limiting.    Time 6    Status  Partially Met   improved in cervical flexion, B sidebending, B rotation   Target Date 05/07/20      PT LONG TERM GOAL #3   Title Patient to demonstrate R UE strength >/=4+/5 and symmetrical grip strength.    Time 6    Period Weeks    Status Partially Met   improved in B grip strength, still limited in B shoulder strength   Target Date 05/07/20      PT LONG TERM GOAL #4   Title Patient to report 90% improvement in driving tolerance in order to return to work pain-free.    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    Time 6    Period Weeks    Status On-going   03/26/20: L knee AROM 1-104 degrees, PROM 1-107 degrees   Target Date 05/07/20      PT LONG TERM GOAL #6   Title Patient to demonstrate B LE strength >/=4+/5.    Time 6    Period Weeks    Status Partially Met   L knee extension weakness remaining   Target Date 05/07/20      PT LONG TERM GOAL #7   Title Patient to demonstrate reciprocal stair climbing up/down 13 stairs with 1 handrail as needed with good stability and <3/10 pain.    Time 6    Period Weeks    Status Partially Met   able to demonstrate reciprocal alternating pattern but with decreased L quad control when descending but no pain   Target Date 05/07/20                 Plan - 03/26/20 1720    Clinical Impression Statement Patient arrived to session with no new complaints. Patient now able to reach L knee AROM 1-104 degrees, PROM 1-107 degrees. Encouraged patient to continue knee flexion stretching in order to meet goals. LE strength testing revealed remaining weakness in L quad. UE strength testing revealed improvement B grip and elbow strength, however, patient still limited in B shoulder strength. Cervical AROM taken last session showed progress in cervical flexion, B  sidebending, and B rotation. Patient is demonstrating improved continuity and confidence with stair navigation, but with some L quad weakness remaining. 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 Worked on resisted TKE to address remaining L quad weakness, which patient tolerated well. Patient reported understanding of HEP update and without complaints at end of session. Patient is demonstrating good progress with therapy and would benefit from continued skilled PT services 2x/week for 6 weeks to address remaining goals.    Comorbidities DM II, HTN, angina, anemia, anterior cervical fusion, L heart cath & angio 2015    Rehab Potential Good    PT Frequency 2x / week    PT Duration 6 weeks    PT  Treatment/Interventions ADLs/Self Care Home Management;Cryotherapy;Electrical Stimulation;Moist Heat;Therapeutic exercise;Therapeutic activities;Functional mobility training;Ultrasound;Neuromuscular re-education;Patient/family education;Manual techniques;Taping;Energy conservation;Dry needling;Balance training;Stair training;Gait training;Vasopneumatic Device;Passive range of motion;Scar mobilization    PT Next Visit Plan progress L knee flexion ROM, jt mobs, step up/downs, quad strengthening, shoulder strengthening    Consulted and Agree with Plan of Care Patient    Family Member Consulted sister           Patient will benefit from skilled therapeutic intervention in order to improve the following deficits and impairments:  Increased edema, Decreased activity tolerance, Decreased strength, Increased fascial restricitons, Pain, Impaired UE functional use, Increased muscle spasms, Improper body mechanics, Decreased range of motion, Impaired flexibility, Postural dysfunction, Abnormal gait, Hypomobility, Decreased scar mobility, Decreased balance, Difficulty walking  Visit Diagnosis: Acute pain of left knee  Stiffness of left knee,  not elsewhere classified  Cervicalgia  Muscle weakness (generalized)  Difficulty in walking, not elsewhere classified  Other symptoms and signs involving the musculoskeletal system     Problem List Patient Active Problem List   Diagnosis Date Noted  . 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 Acute bronchitis 08/10/2014  . Unstable angina ([place]) 05/29/2014  . Abnormal nuclear stress test 05/29/2014  . Chest pain 05/06/2014  . Exertional shortness of breath 05/06/2014  . Mixed hyperlipidemia 05/06/2014  . Hyperlipidemia with target LDL less than 70 08/05/2013  . HTN (hypertension) 08/05/2013  . Chest wall pain 08/05/2013  . SINUSITIS, ACUTE 04/07/2009  . HYPOTHYROIDISM 12/10/2007  . DIABETES MELLITUS, TYPE II 05/28/2007  . Obstructive sleep apnea 05/28/2007  . Coronary atherosclerosis 05/28/2007  . Seasonal and perennial allergic rhinitis 05/28/2007  . COUGH, CHRONIC 05/28/2007     Janene Harvey, PT, DPT 03/26/20 5:23 PM   [facility] [address]  [place], [name], [address] Phone: [phone]   Fax:  [phone]  [name] MRN: [id number] Date of Birth: 01/26/1956

## 2020-03-30 ENCOUNTER — Ambulatory Visit: Payer: 59 | Admitting: Physical Therapy

## 2020-03-30 ENCOUNTER — Encounter: Payer: Self-pay | Admitting: Physical Therapy

## 2020-03-30 ENCOUNTER — Other Ambulatory Visit: Payer: Self-pay

## 2020-03-30 DIAGNOSIS — M6281 Muscle weakness (generalized): Secondary | ICD-10-CM

## 2020-03-30 DIAGNOSIS — M542 Cervicalgia: Secondary | ICD-10-CM

## 2020-03-30 DIAGNOSIS — M25562 Pain in left knee: Secondary | ICD-10-CM

## 2020-03-30 DIAGNOSIS — R29898 Other symptoms and signs involving the musculoskeletal system: Secondary | ICD-10-CM

## 2020-03-30 DIAGNOSIS — R262 Difficulty in walking, not elsewhere classified: Secondary | ICD-10-CM

## 2020-03-30 DIAGNOSIS — M25662 Stiffness of left knee, not elsewhere classified: Secondary | ICD-10-CM

## 2020-03-30 NOTE — Therapy (Signed)
[facility] [address]  [place], [name], [address] Phone: [phone]   Fax:  [phone]  Physical Therapy Treatment  Patient Details  [name] MRN: [id number] Date of Birth: 26-Sep-1955 Referring Provider (PT): Susa Day, MD   Encounter Date: 03/30/2020   PT End of Session - 03/30/20 1107    Visit Number 11    Number of Visits 22    Date for PT Re-Evaluation 05/07/20    Authorization Type [place] Time 1021   pt late   PT Stop Time 1100    PT Time Calculation (min) 39 min    Activity Tolerance Patient tolerated treatment well    Behavior During Therapy [facility] for tasks assessed/performed           Past Medical History:  Diagnosis Date  . Acute bronchitis   . Anemia    hx of anemia  . Anginal pain ([place])   . Arthritis    osteo  . Asthma   . Coronary atherosclerosis   . Cough   . GERD (gastroesophageal reflux disease)   . History of colon polyps   . History of migraine   . Hyperlipidemia   . Hypertension   . Obstructive sleep apnea (adult) (pediatric)    NO CPAP   . Peripheral neuropathy   . Type II or unspecified type diabetes mellitus without mention of complication, not stated as uncontrolled    type 2  . Unspecified hypothyroidism     Past Surgical History:  Procedure Laterality Date  . ANTERIOR FUSION CERVICAL SPINE    . CARPAL TUNNEL RELEASE Left   . COLONOSCOPY    . CORONARY ANGIOPLASTY    . LEFT HEART CATHETERIZATION WITH CORONARY ANGIOGRAM N/A 05/29/2014   Procedure: LEFT HEART CATHETERIZATION WITH CORONARY ANGIOGRAM;  Surgeon: Sinclair Grooms, MD;  Location: [facility] CATH LAB;  Service: Cardiovascular;  Laterality: N/A;  . 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   Procedure: TOTAL KNEE ARTHROPLASTY;  Surgeon: Susa Day, MD;  Location: [facility];  Service: Orthopedics;  Laterality: Left;  3 hrs  . TUBAL LIGATION    . WISDOM TOOTH EXTRACTION       There were no vitals filed for this visit.   Subjective Assessment - 03/30/20 1023    Subjective Returned to work today and traffic was "horrible." Was on her feet a whole lot over the weekend and feels like the knee was angry from this.    Pertinent History DM II, HTN, angina, anemia, anterior cervical fusion, L heart cath & angio 2015    Diagnostic tests none recent    Patient Stated Goals get rid of pain    Currently in Pain? Yes    Pain Score 6     Pain Location Knee    Pain Orientation Left    Pain Descriptors / Indicators Tiring    Pain Type Acute pain                             [facility] Adult PT Treatment/Exercise - 03/30/20 0001      Knee/Hip Exercises: Stretches   Hip Flexor Stretch Left;30 seconds;2 reps    Hip Flexor Stretch Limitations mod thomas with strap      Knee/Hip Exercises: Aerobic   Recumbent Bike lvl 1, 6 in - full revolutions       Knee/Hip Exercises: Standing  Lateral Step Up Left;1 set;10 reps;Hand Hold: 2;Step Height: 6"    Lateral Step Up Limitations step up/down with good control      Knee/Hip Exercises: Supine   Heel Slides AAROM;Left;1 set;10 reps    Heel Slides Limitations 10x5" with orange pball and strap to tolerance    Straight Leg Raises Left;10 reps;Strengthening    Straight Leg Raises Limitations 2#' mild lack in TKE    Straight Leg Raise with External Rotation Strengthening;Left;1 set;10 reps    Straight Leg Raise with External Rotation Limitations unable to perform with # d/t difficulty/weakness      Manual Therapy   Manual Therapy Joint mobilization    Manual therapy comments supine    Joint Mobilization L patellar mobs in M/L and superior/inferior directions grade III/IV to tolerance   increased hypomobility in all directions which improved                 PT Education - 03/30/20 1107    Education Details educated patient on importance of using ice and elevation to L knee after long periods of  standing/sitting    Person(s) Educated Patient    Methods Explanation;Demonstration;Tactile cues;Verbal cues    Comprehension Verbalized understanding            PT Short Term Goals - 03/03/20 1558      PT SHORT TERM GOAL #1   Title Patient to be independent with initial HEP.    Time 3    Period Weeks    Status Achieved    Target Date 03/11/20             PT Long Term Goals - 03/26/20 1559      PT LONG TERM GOAL #1   Title Patient to be independent with advanced HEP.    Time 6    Period Weeks    Status Partially Met   met for current   Target Date 05/07/20      PT LONG TERM GOAL #2   Title Patient to demonstrate cervical AROM WFL and without pain limiting.    Time 6    Status Partially Met   improved in cervical flexion, B sidebending, B rotation   Target Date 05/07/20      PT LONG TERM GOAL #3   Title Patient to demonstrate R UE strength >/=4+/5 and symmetrical grip strength.    Time 6    Period Weeks    Status Partially Met   improved in B grip strength, still limited in B shoulder strength   Target Date 05/07/20      PT LONG TERM GOAL #4   Title Patient to report 90% improvement in driving tolerance in order to return to work pain-free.    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    Time 6    Period Weeks    Status On-going   03/26/20: L knee AROM 1-104 degrees, PROM 1-107 degrees   Target Date 05/07/20      PT LONG TERM GOAL #6   Title Patient to demonstrate B LE strength >/=4+/5.    Time 6    Period Weeks    Status Partially Met   L knee extension weakness remaining  Target Date 05/07/20      PT LONG TERM GOAL #7   Title Patient to demonstrate reciprocal stair climbing up/down 13 stairs with 1 handrail as needed with good stability and <3/10 pain.    Time 6      Period Weeks    Status Partially Met   able to demonstrate reciprocal alternating pattern but with decreased L quad control when descending but no pain   Target Date 05/07/20                 Plan - 03/30/20 1108    Clinical Impression Statement Patient noting that she returned to work this AM without much issue, however noting L knee tightness and fatigue after being on her feet more over the weekend. Worked on gentle LE stretching to address patient's complaints. Worked on progressing quad strengthening with SLR's, with patient demonstrating slight lack in TKE and c/o hip flexor muscle fatigue.  Demonstrated increased hypomobility with patellar mobs which improved with manual therapy. Patient with evident L ankle and calf edema but without tenderness or warmth in the calf. Educated patient on importance of using ice and elevation after long periods of standing/sitting. Patient performed lateral step up/downs with good effort to maintain eccentric control upon step down. Ended session without modalities as patient declined. No complaints at end of session.    Comorbidities DM II, HTN, angina, anemia, anterior cervical fusion, L heart cath & angio 2015    Rehab Potential Good    PT Frequency 2x / week    PT Duration 6 weeks    PT Treatment/Interventions ADLs/Self Care Home Management;Cryotherapy;Electrical Stimulation;Moist Heat;Therapeutic exercise;Therapeutic activities;Functional mobility training;Ultrasound;Neuromuscular re-education;Patient/family education;Manual techniques;Taping;Energy conservation;Dry needling;Balance training;Stair training;Gait training;Vasopneumatic Device;Passive range of motion;Scar mobilization    PT Next Visit Plan progress L knee flexion ROM, jt mobs, step up/downs, quad strengthening, shoulder strengthening    Consulted and Agree with Plan of Care Patient    Family Member Consulted sister           Patient will benefit from skilled therapeutic  intervention in order to improve the following deficits and impairments:  Increased edema, Decreased activity tolerance, Decreased strength, Increased fascial restricitons, Pain, Impaired UE functional use, Increased muscle spasms, Improper body mechanics, Decreased range of motion, Impaired flexibility, Postural dysfunction, Abnormal gait, Hypomobility, Decreased scar mobility, Decreased balance, Difficulty walking  Visit Diagnosis: Acute pain of left knee  Stiffness of left knee, not elsewhere classified  Cervicalgia  Muscle weakness (generalized)  Difficulty in walking, not elsewhere classified  Other symptoms and signs involving the musculoskeletal system     Problem List Patient Active Problem List   Diagnosis Date Noted  . 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 Acute bronchitis 08/10/2014  . Unstable angina ([place]) 05/29/2014  . Abnormal nuclear stress test 05/29/2014  . Chest pain 05/06/2014  . Exertional shortness of breath 05/06/2014  . Mixed hyperlipidemia 05/06/2014  . Hyperlipidemia with target LDL less than 70 08/05/2013  . HTN (hypertension) 08/05/2013  . Chest wall pain 08/05/2013  . SINUSITIS, ACUTE 04/07/2009  . HYPOTHYROIDISM 12/10/2007  . DIABETES MELLITUS, TYPE II 05/28/2007  . Obstructive sleep apnea 05/28/2007  . Coronary atherosclerosis 05/28/2007  . Seasonal and perennial allergic rhinitis 05/28/2007  . COUGH, CHRONIC 05/28/2007     Janene Harvey, PT, DPT 03/30/20 11:10 AM   [facility] [address]  [place], [name], [address] Phone: [phone]   Fax:  [phone]  Name: Denise Males  [name] MRN: [id number] Date of Birth: 1955-10-16

## 2020-04-01 ENCOUNTER — Ambulatory Visit: Payer: 59 | Admitting: Physical Therapy

## 2020-04-06 ENCOUNTER — Other Ambulatory Visit: Payer: Self-pay

## 2020-04-06 ENCOUNTER — Ambulatory Visit: Payer: 59 | Admitting: Physical Therapy

## 2020-04-06 ENCOUNTER — Encounter: Payer: Self-pay | Admitting: Physical Therapy

## 2020-04-06 DIAGNOSIS — R29898 Other symptoms and signs involving the musculoskeletal system: Secondary | ICD-10-CM

## 2020-04-06 DIAGNOSIS — M542 Cervicalgia: Secondary | ICD-10-CM

## 2020-04-06 DIAGNOSIS — M25562 Pain in left knee: Secondary | ICD-10-CM | POA: Diagnosis not present

## 2020-04-06 DIAGNOSIS — M25662 Stiffness of left knee, not elsewhere classified: Secondary | ICD-10-CM

## 2020-04-06 DIAGNOSIS — M6281 Muscle weakness (generalized): Secondary | ICD-10-CM

## 2020-04-06 DIAGNOSIS — R262 Difficulty in walking, not elsewhere classified: Secondary | ICD-10-CM

## 2020-04-06 NOTE — Therapy (Signed)
[facility] [address]  [place], [name], [address] Phone: [phone]   Fax:  [phone]  Physical Therapy Treatment  Patient Details  [name] MRN: [id number] Date of Birth: 07-Jul-1956 Referring Provider (PT): Susa Day, MD   Encounter Date: 04/06/2020   PT End of Session - 04/06/20 1114    Visit Number 12    Number of Visits 22    Date for PT Re-Evaluation 05/07/20    Authorization Type [employer]    PT Start Time 1019    PT Stop Time 1102    PT Time Calculation (min) 43 min    Activity Tolerance Patient tolerated treatment well    Behavior During Therapy [facility] for tasks assessed/performed           Past Medical History:  Diagnosis Date  . Acute bronchitis   . Anemia    hx of anemia  . Anginal pain ([place])   . Arthritis    osteo  . Asthma   . Coronary atherosclerosis   . Cough   . GERD (gastroesophageal reflux disease)   . History of colon polyps   . History of migraine   . Hyperlipidemia   . Hypertension   . Obstructive sleep apnea (adult) (pediatric)    NO CPAP   . Peripheral neuropathy   . Type II or unspecified type diabetes mellitus without mention of complication, not stated as uncontrolled    type 2  . Unspecified hypothyroidism     Past Surgical History:  Procedure Laterality Date  . ANTERIOR FUSION CERVICAL SPINE    . CARPAL TUNNEL RELEASE Left   . COLONOSCOPY    . CORONARY ANGIOPLASTY    . LEFT HEART CATHETERIZATION WITH CORONARY ANGIOGRAM N/A 05/29/2014   Procedure: LEFT HEART CATHETERIZATION WITH CORONARY ANGIOGRAM;  Surgeon: Sinclair Grooms, MD;  Location: [facility] CATH LAB;  Service: Cardiovascular;  Laterality: N/A;  . 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   Procedure: TOTAL KNEE ARTHROPLASTY;  Surgeon: Susa Day, MD;  Location: [facility] ORS;  Service: Orthopedics;  Laterality: Left;  3 hrs  . TUBAL LIGATION    . WISDOM TOOTH EXTRACTION      There were  no vitals filed for this visit.   Subjective Assessment - 04/06/20 1020    Subjective Had some swelling in the L knee last week d/t the hot weather. L knee feels tight and stiff but not having pain.    Pertinent History DM II, HTN, angina, anemia, anterior cervical fusion, L heart cath & angio 2015    Diagnostic tests none recent    Patient Stated Goals get rid of pain    Currently in Pain? No/denies              [facility] PT Assessment - 04/06/20 0001      AROM   Left Knee Extension 1    Left Knee Flexion 106      PROM   Left Knee Extension 0    Left Knee Flexion 110      Strength   Left Knee Flexion 4+/5    Left Knee Extension 4/5                         [facility] Adult PT Treatment/Exercise - 04/06/20 0001      Ambulation/Gait   Stairs Yes    Stairs Assistance 7: Independent    Stair Management Technique One  rail Right;Alternating pattern    Number of Stairs 13    Height of Stairs 8    Gait Comments anterior trunk lean ascending and decreased eccentric control when descending on R LE, indicating L quad weakness      Knee/Hip Exercises: Stretches   Passive Hamstring Stretch Left;2 reps;30 seconds    Passive Hamstring Stretch Limitations supine with strap     Hip Flexor Stretch Left;30 seconds;2 reps    Hip Flexor Stretch Limitations mod thomas with strap    Other Knee/Hip Stretches sitting L knee flexion stretch 5x10"      Knee/Hip Exercises: Aerobic   Recumbent Bike lvl 1, 4 in - full revolutions    discontinued d/t cramping in R adductor     Knee/Hip Exercises: Standing   Lateral Step Up Left;10 reps;Hand Hold: 2;Step Height: 4";2 sets;Step Height: 6"    Lateral Step Up Limitations step down with heel touch; good control with 4", placed 7.5# weight on floor with 6"    Functional Squat 2 sets;10 reps    Functional Squat Limitations 2nd set with chair support and blue TKE on L LE   shallow squat d/t R knee pain                 PT Education -  04/06/20 1114    Education Details discussion on objective progress and remaining impairments; update to HEP    Person(s) Educated Patient    Methods Explanation;Demonstration;Tactile cues;Verbal cues;Handout    Comprehension Verbalized understanding;Returned demonstration            PT Short Term Goals - 04/06/20 1116      PT SHORT TERM GOAL #1   Title Patient to be independent with initial HEP.    Time 3    Period Weeks    Status Achieved    Target Date 03/11/20             PT Long Term Goals - 04/06/20 1035      PT LONG TERM GOAL #1   Title Patient to be independent with advanced HEP.    Time 6    Period Weeks    Status Partially Met   met for current     PT LONG TERM GOAL #2   Title Patient to demonstrate cervical AROM WFL and without pain limiting.    Time 6    Status Partially Met   improved in cervical flexion, B sidebending, B rotation     PT LONG TERM GOAL #3   Title Patient to demonstrate R UE strength >/=4+/5 and symmetrical grip strength.    Time 6    Period Weeks    Status Partially Met   improved in B grip strength, still limited in B shoulder strength     PT LONG TERM GOAL #4   Title Patient to report 90% improvement in driving tolerance in order to return to work pain-free.    Time 6    Period Weeks    Status Achieved   denies pain in the L knee while driving her but, but does note increased swelling     PT LONG TERM GOAL #5   Title Patient to demonstrate L knee AROM 0-120 degrees.    Time 6    Period Weeks    Status On-going   04/06/20: L knee AROM 1-106 degrees, PROM 1-110 degrees     PT LONG TERM GOAL #6   Title Patient to demonstrate B LE strength >/=4+/5.  Time 6    Period Weeks    Status Partially Met   L knee extension weakness remaining     PT LONG TERM GOAL #7   Title Patient to demonstrate reciprocal stair climbing up/down 13 stairs with 1 handrail as needed with good stability and <3/10 pain.    Time 6    Period Weeks     Status Partially Met   able to demonstrate reciprocal alternating pattern but with decreased L quad control when descending but no pain                Plan - 04/06/20 1118    Clinical Impression Statement Patient without new complaints today. Observed L knee wound at start of session, with it still showing a brown/grey discoloration d/t Silver Nitrate however without drainage and appearing slightly smaller today. Patient seems to be taking good care to maintain wound covered. 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 After stretching, patient was able to reach L knee 1-106 degrees AROM, 0-110 degrees PROM. L quad weakness still remaining with strength testing today, thus worked on squatting and step down activities to address this. Patient with tendency to lean trunk anteriorly d/t difficulty with exercises, requiring cueing to correct. This was also evident with stair climbing today, which still showed some L quad weakness when ascending and descending stairs. Patient reported understanding of HEP update and without complaints at end of session. Patient is progressing well towards goals.    Comorbidities DM II, HTN, angina, anemia, anterior cervical fusion, L heart cath & angio 2015    Rehab Potential Good    PT Frequency 2x / week    PT Duration 6 weeks    PT Treatment/Interventions ADLs/Self Care Home Management;Cryotherapy;Electrical Stimulation;Moist Heat;Therapeutic exercise;Therapeutic activities;Functional mobility training;Ultrasound;Neuromuscular re-education;Patient/family education;Manual techniques;Taping;Energy conservation;Dry needling;Balance training;Stair training;Gait training;Vasopneumatic Device;Passive range of motion;Scar mobilization    PT Next Visit Plan progress L knee flexion ROM, jt mobs, step up/downs, quad strengthening, shoulder strengthening    Consulted and  Agree with Plan of Care Patient    Family Member Consulted sister           Patient will benefit from skilled therapeutic intervention in order to improve the following deficits and impairments:  Increased edema, Decreased activity tolerance, Decreased strength, Increased fascial restricitons, Pain, Impaired UE functional use, Increased muscle spasms, Improper body mechanics, Decreased range of motion, Impaired flexibility, Postural dysfunction, Abnormal gait, Hypomobility, Decreased scar mobility, Decreased balance, Difficulty walking  Visit Diagnosis: Acute pain of left knee  Stiffness of left knee, not elsewhere classified  Cervicalgia  Muscle weakness (generalized)  Difficulty in walking, not elsewhere classified  Other symptoms and signs involving the musculoskeletal system     Problem List Patient Active Problem List   Diagnosis Date Noted  . 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 Acute bronchitis 08/10/2014  . Unstable angina ([place]) 05/29/2014  . Abnormal nuclear stress test 05/29/2014  . Chest pain 05/06/2014  . Exertional shortness of breath 05/06/2014  . Mixed hyperlipidemia 05/06/2014  . Hyperlipidemia with target LDL less than 70 08/05/2013  . HTN (hypertension) 08/05/2013  . Chest wall pain 08/05/2013  . SINUSITIS, ACUTE 04/07/2009  . HYPOTHYROIDISM 12/10/2007  . DIABETES MELLITUS, TYPE II 05/28/2007  . Obstructive sleep apnea 05/28/2007  . Coronary atherosclerosis 05/28/2007  . Seasonal and perennial allergic rhinitis 05/28/2007  . COUGH, CHRONIC 05/28/2007  Janene Harvey, PT, DPT 04/06/20 11:26 AM   [facility] [address]  [place], [name], [address] Phone: [phone]   Fax:  [phone]  [name] MRN: [id number] Date of Birth: 1956/06/22

## 2020-04-10 ENCOUNTER — Ambulatory Visit: Payer: 59 | Admitting: Physical Therapy

## 2020-04-14 ENCOUNTER — Telehealth: Payer: Self-pay | Admitting: Critical Care Medicine

## 2020-04-14 NOTE — Telephone Encounter (Signed)
Nucala Order: 100mg  #1 Vial Order Date: 04/14/20 Expected date of arrival: 9/8 or 04/16/20 Ordered by: [place]: [name]

## 2020-04-15 ENCOUNTER — Ambulatory Visit: Payer: 59 | Admitting: Physical Therapy

## 2020-04-15 NOTE — Telephone Encounter (Signed)
Nucala Shipment Received: 100mg  #1 vial Medication arrival date: 04/15/20 Lot #: [id number] Exp date: 09/08/2023 Received by: Elliot Dally

## 2020-04-17 ENCOUNTER — Other Ambulatory Visit: Payer: Self-pay

## 2020-04-17 ENCOUNTER — Ambulatory Visit: Payer: 59 | Attending: Specialist

## 2020-04-17 DIAGNOSIS — M6281 Muscle weakness (generalized): Secondary | ICD-10-CM | POA: Diagnosis present

## 2020-04-17 DIAGNOSIS — R262 Difficulty in walking, not elsewhere classified: Secondary | ICD-10-CM | POA: Diagnosis present

## 2020-04-17 DIAGNOSIS — M25662 Stiffness of left knee, not elsewhere classified: Secondary | ICD-10-CM | POA: Insufficient documentation

## 2020-04-17 DIAGNOSIS — M25562 Pain in left knee: Secondary | ICD-10-CM | POA: Diagnosis present

## 2020-04-17 DIAGNOSIS — R29898 Other symptoms and signs involving the musculoskeletal system: Secondary | ICD-10-CM

## 2020-04-17 DIAGNOSIS — M542 Cervicalgia: Secondary | ICD-10-CM | POA: Insufficient documentation

## 2020-04-17 NOTE — Therapy (Addendum)
[facility] [address]  [place], [name], [address] Phone: [phone]   Fax:  [phone]  Physical Therapy Treatment  Patient Details  [name] MRN: [id number] Date of Birth: Jul 05, 1956 Referring Provider (PT): Susa Day, MD   Encounter Date: 04/17/2020   PT End of Session - 04/17/20 1028    Visit Number 13    Number of Visits 22    Date for PT Re-Evaluation 05/07/20    Authorization Type [employer]    PT Start Time 1017    PT Stop Time 1112    PT Time Calculation (min) 55 min    Activity Tolerance Patient tolerated treatment well    Behavior During Therapy [facility] for tasks assessed/performed           Past Medical History:  Diagnosis Date  . Acute bronchitis   . Anemia    hx of anemia  . Anginal pain ([place])   . Arthritis    osteo  . Asthma   . Coronary atherosclerosis   . Cough   . GERD (gastroesophageal reflux disease)   . History of colon polyps   . History of migraine   . Hyperlipidemia   . Hypertension   . Obstructive sleep apnea (adult) (pediatric)    NO CPAP   . Peripheral neuropathy   . Type II or unspecified type diabetes mellitus without mention of complication, not stated as uncontrolled    type 2  . Unspecified hypothyroidism     Past Surgical History:  Procedure Laterality Date  . ANTERIOR FUSION CERVICAL SPINE    . CARPAL TUNNEL RELEASE Left   . COLONOSCOPY    . CORONARY ANGIOPLASTY    . LEFT HEART CATHETERIZATION WITH CORONARY ANGIOGRAM N/A 05/29/2014   Procedure: LEFT HEART CATHETERIZATION WITH CORONARY ANGIOGRAM;  Surgeon: Sinclair Grooms, MD;  Location: [facility] CATH LAB;  Service: Cardiovascular;  Laterality: N/A;  . 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   Procedure: TOTAL KNEE ARTHROPLASTY;  Surgeon: Susa Day, MD;  Location: [facility] ORS;  Service: Orthopedics;  Laterality: Left;  3 hrs  . TUBAL LIGATION    . WISDOM TOOTH EXTRACTION      There were  no vitals filed for this visit.   Subjective Assessment - 04/17/20 1029    Subjective Pt. doing ok.  having some L gland soreness under her L jaw bone.  Notes the L inferior knee wound has turned grey after MD put silver nitrate on wound last friday.    Pertinent History DM II, HTN, angina, anemia, anterior cervical fusion, L heart cath & angio 2015    Diagnostic tests none recent    Patient Stated Goals get rid of pain    Currently in Pain? No/denies    Pain Score 0-No pain    Pain Location Knee    Pain Orientation Left              [facility] PT Assessment - 04/17/20 0001      AROM   Left Knee Extension 1    Left Knee Flexion 106      PROM   Left Knee Extension 0    Left Knee Flexion 113                         [facility] Adult PT Treatment/Exercise - 04/17/20 0001      Self-Care   Self-Care Other Self-Care Comments  Other Self-Care Comments  inspection of L knee incisional wound, L- LE swelling, discussion of pt. current symptoms since Wednesday night (L inferior jaw gland soreness, low energy level); supervising PT with lengthy discussion with pt. and inspection of wound      Lumbar Exercises: Stretches   Lower Trunk Rotation Limitations 5" x 10       Knee/Hip Exercises: Stretches   Other Knee/Hip Stretches B sidelying "open book" stretch 5" x 10 reps       Knee/Hip Exercises: Aerobic   Recumbent Bike lvl 1, 6 in - full revolutions       Moist Heat Therapy   Number Minutes Moist Heat 10 Minutes    Moist Heat Location Lumbar Spine   thoracic spine      Electrical Stimulation   Electrical Stimulation Location R-sided mid back     Electrical Stimulation Action IFC    Electrical Stimulation Parameters 80-_0 , intensity to pt. tolerance, 10'    Electrical Stimulation Goals Pain                    PT Short Term Goals - 04/06/20 1116      PT SHORT TERM GOAL #1   Title Patient to be independent with initial HEP.    Time 3    Period Weeks     Status Achieved    Target Date 03/11/20             PT Long Term Goals - 04/06/20 1035      PT LONG TERM GOAL #1   Title Patient to be independent with advanced HEP.    Time 6    Period Weeks    Status Partially Met   met for current     PT LONG TERM GOAL #2   Title Patient to demonstrate cervical AROM WFL and without pain limiting.    Time 6    Status Partially Met   improved in cervical flexion, B sidebending, B rotation     PT LONG TERM GOAL #3   Title Patient to demonstrate R UE strength >/=4+/5 and symmetrical grip strength.    Time 6    Period Weeks    Status Partially Met   improved in B grip strength, still limited in B shoulder strength     PT LONG TERM GOAL #4   Title Patient to report 90% improvement in driving tolerance in order to return to work pain-free.    Time 6    Period Weeks    Status Achieved   denies pain in the L knee while driving her but, but does note increased swelling     PT LONG TERM GOAL #5   Title Patient to demonstrate L knee AROM 0-120 degrees.    Time 6    Period Weeks    Status On-going   04/06/20: L knee AROM 1-106 degrees, PROM 1-110 degrees     PT LONG TERM GOAL #6   Title Patient to demonstrate B LE strength >/=4+/5.    Time 6    Period Weeks    Status Partially Met   L knee extension weakness remaining     PT LONG TERM GOAL #7   Title Patient to demonstrate reciprocal stair climbing up/down 13 stairs with 1 handrail as needed with good stability and <3/10 pain.    Time 6    Period Weeks    Status Partially Met   able to demonstrate reciprocal alternating pattern but with decreased  L quad control when descending but no pain                Plan - 04/17/20 1031    Clinical Impression Statement [name] reporting that she has been feeling "off" since Wednesday night.  Has had some sinus pain, low on energy, and with some gland soreness under L jaw since Wednesday night.  Does present with some visible increase in L ankle/LE  swelling however notes L incisional wound appears unchanged.  Notes upcoming MD f/u in a few weeks however supervising PT reached out to referring physician on behalf of patient today to schedule checkup.  Pt. seemed to be low on energy/"off" throughout session today thus session focused on conservative thoracolumbar ROM activities to address R-sided mid back pain and L knee flexion AROM progressed to 106 dg, PROM flexion 113 dg.  Ended visit with E-stim/moist heat to thoracic spine with good pain relief noted.    Comorbidities DM II, HTN, angina, anemia, anterior cervical fusion, L heart cath & angio 2015    Rehab Potential Good    PT Frequency 2x / week    PT Duration 6 weeks    PT Treatment/Interventions ADLs/Self Care Home Management;Cryotherapy;Electrical Stimulation;Moist Heat;Therapeutic exercise;Therapeutic activities;Functional mobility training;Ultrasound;Neuromuscular re-education;Patient/family education;Manual techniques;Taping;Energy conservation;Dry needling;Balance training;Stair training;Gait training;Vasopneumatic Device;Passive range of motion;Scar mobilization    PT Next Visit Plan progress L knee flexion ROM, jt mobs, step up/downs, quad strengthening, shoulder strengthening    Consulted and Agree with Plan of Care Patient           Patient will benefit from skilled therapeutic intervention in order to improve the following deficits and impairments:  Increased edema, Decreased activity tolerance, Decreased strength, Increased fascial restricitons, Pain, Impaired UE functional use, Increased muscle spasms, Improper body mechanics, Decreased range of motion, Impaired flexibility, Postural dysfunction, Abnormal gait, Hypomobility, Decreased scar mobility, Decreased balance, Difficulty walking  Visit Diagnosis: Acute pain of left knee  Stiffness of left knee, not elsewhere classified  Cervicalgia  Muscle weakness (generalized)  Difficulty in walking, not elsewhere  classified  Other symptoms and signs involving the musculoskeletal system     Problem List Patient Active Problem List   Diagnosis Date Noted  . 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 Acute bronchitis 08/10/2014  . Unstable angina ([place]) 05/29/2014  . Abnormal nuclear stress test 05/29/2014  . Chest pain 05/06/2014  . Exertional shortness of breath 05/06/2014  . Mixed hyperlipidemia 05/06/2014  . Hyperlipidemia with target LDL less than 70 08/05/2013  . HTN (hypertension) 08/05/2013  . Chest wall pain 08/05/2013  . SINUSITIS, ACUTE 04/07/2009  . HYPOTHYROIDISM 12/10/2007  . DIABETES MELLITUS, TYPE II 05/28/2007  . Obstructive sleep apnea 05/28/2007  . Coronary atherosclerosis 05/28/2007  . Seasonal and perennial allergic rhinitis 05/28/2007  . COUGH, CHRONIC 05/28/2007    Bess Harvest, PTA 04/17/20 12:17 PM   [facility] [address]  [place], [name], [address] Phone: [phone]   Fax:  [phone]  [name] MRN: [id number] Date of Birth: [birth year]  PHYSICAL THERAPY DISCHARGE SUMMARY  Visits from Start of Care: 13  Current functional level related to goals / functional outcomes: Unable to assess- patient requested to be D/C'd after most recent hospitalization   Remaining deficits: Unable to assess   Education / Equipment: HEP  Plan: Patient agrees to discharge.  Patient goals were partially met. Patient is being discharged due to a change in medical status.  ?????  Janene Harvey, PT, DPT 05/25/20 3:18 PM

## 2020-04-20 ENCOUNTER — Ambulatory Visit (INDEPENDENT_AMBULATORY_CARE_PROVIDER_SITE_OTHER): Payer: 59

## 2020-04-20 ENCOUNTER — Other Ambulatory Visit: Payer: Self-pay

## 2020-04-20 DIAGNOSIS — J4550 Severe persistent asthma, uncomplicated: Secondary | ICD-10-CM

## 2020-04-20 MED ORDER — MEPOLIZUMAB 100 MG ~~LOC~~ SOLR
100.0000 mg | SUBCUTANEOUS | Status: DC
  Administered 2020-04-20: 100 mg via SUBCUTANEOUS

## 2020-04-20 NOTE — Progress Notes (Signed)
Have you been hospitalized within the last 10 days?  No Do you have a fever?  No Do you have a cough?  No Do you have a headache or sore throat? No Do you have your Epi Pen visible and is it within date?  Yes 

## 2020-04-21 ENCOUNTER — Ambulatory Visit: Payer: 59 | Admitting: Physical Therapy

## 2020-04-24 ENCOUNTER — Emergency Department (HOSPITAL_BASED_OUTPATIENT_CLINIC_OR_DEPARTMENT_OTHER)
Admission: EM | Admit: 2020-04-24 | Discharge: 2020-04-24 | Disposition: A | Discharge status: Home / Self Care | Payer: 59 | Source: Home / Self Care | Attending: Emergency Medicine | Admitting: Emergency Medicine

## 2020-04-24 ENCOUNTER — Encounter (HOSPITAL_BASED_OUTPATIENT_CLINIC_OR_DEPARTMENT_OTHER): Payer: Self-pay | Admitting: *Deleted

## 2020-04-24 ENCOUNTER — Other Ambulatory Visit: Payer: Self-pay

## 2020-04-24 DIAGNOSIS — K121 Other forms of stomatitis: Secondary | ICD-10-CM

## 2020-04-24 DIAGNOSIS — J45909 Unspecified asthma, uncomplicated: Secondary | ICD-10-CM | POA: Insufficient documentation

## 2020-04-24 DIAGNOSIS — Z87891 Personal history of nicotine dependence: Secondary | ICD-10-CM | POA: Insufficient documentation

## 2020-04-24 DIAGNOSIS — Z96653 Presence of artificial knee joint, bilateral: Secondary | ICD-10-CM | POA: Insufficient documentation

## 2020-04-24 DIAGNOSIS — E039 Hypothyroidism, unspecified: Secondary | ICD-10-CM | POA: Insufficient documentation

## 2020-04-24 DIAGNOSIS — Z7982 Long term (current) use of aspirin: Secondary | ICD-10-CM | POA: Insufficient documentation

## 2020-04-24 DIAGNOSIS — Z7951 Long term (current) use of inhaled steroids: Secondary | ICD-10-CM | POA: Insufficient documentation

## 2020-04-24 DIAGNOSIS — R197 Diarrhea, unspecified: Secondary | ICD-10-CM | POA: Insufficient documentation

## 2020-04-24 DIAGNOSIS — I25119 Atherosclerotic heart disease of native coronary artery with unspecified angina pectoris: Secondary | ICD-10-CM | POA: Insufficient documentation

## 2020-04-24 DIAGNOSIS — Z794 Long term (current) use of insulin: Secondary | ICD-10-CM | POA: Insufficient documentation

## 2020-04-24 DIAGNOSIS — I10 Essential (primary) hypertension: Secondary | ICD-10-CM | POA: Insufficient documentation

## 2020-04-24 DIAGNOSIS — Z79890 Hormone replacement therapy (postmenopausal): Secondary | ICD-10-CM | POA: Insufficient documentation

## 2020-04-24 MED ORDER — LIDOCAINE VISCOUS HCL 2 % MT SOLN
15.0000 mL | OROMUCOSAL | 0 refills | Status: DC | PRN
Start: 2020-04-24 — End: 2020-08-24

## 2020-04-24 NOTE — Discharge Instructions (Addendum)
Follow up with your ENT, call today to discuss your condition. You do NOT need to wait until your follow up appointment to discuss your condition or seek care from your ENT.  STOP the Amoxicillin, continue with the Clindamycin.  New prescription for viscous lidocaine. Home to rest, push fluids.

## 2020-04-24 NOTE — ED Provider Notes (Signed)
[facility] EMERGENCY DEPARTMENT Provider Note   CSN: [id number] Arrival date & time: 04/24/20  1456     History Chief Complaint  Patient presents with  . Mouth Lesions    [name] is a 64 y.o. female.  64 year old female presents with ongoing mouth pain and rash. Patient states she had sores in her mouth (she believes this was thrush from antibiotics she was given for a knee infection several months ago), was given Cipro by her PCP which caused her to break out in a rash. PCP stopped the Cipro and placed patient on Amoxacillin as well as Nystatin and a steroid injection. Patient followed up with ENT who gave her Clindamycin and magic mouthwash. Patient states this is not helping and she would like viscous lidocaine (states she does not have lidocaine in her mouthwash). Daughter requests additional IM steroids. Patient has not called her ENT office, states she is not scheduled to see them until later this month.         Past Medical History:  Diagnosis Date  . Acute bronchitis   . Anemia    hx of anemia  . Anginal pain ([place])   . Arthritis    osteo  . Asthma   . Coronary atherosclerosis   . Cough   . GERD (gastroesophageal reflux disease)   . History of colon polyps   . History of migraine   . Hyperlipidemia   . Hypertension   . Obstructive sleep apnea (adult) (pediatric)    NO CPAP   . Peripheral neuropathy   . Type II or unspecified type diabetes mellitus without mention of complication, not stated as uncontrolled    type 2  . Unspecified hypothyroidism     Patient Active Problem List   Diagnosis Date Noted  . 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 Acute bronchitis 08/10/2014  . Unstable angina ([place]) 05/29/2014  . Abnormal nuclear stress test 05/29/2014  . Chest pain 05/06/2014  . Exertional shortness of breath 05/06/2014  . Mixed hyperlipidemia 05/06/2014  . Hyperlipidemia with target LDL less than 70 08/05/2013  . HTN (hypertension) 08/05/2013  .  Chest wall pain 08/05/2013  . SINUSITIS, ACUTE 04/07/2009  . HYPOTHYROIDISM 12/10/2007  . DIABETES MELLITUS, TYPE II 05/28/2007  . Obstructive sleep apnea 05/28/2007  . Coronary atherosclerosis 05/28/2007  . Seasonal and perennial allergic rhinitis 05/28/2007  . COUGH, CHRONIC 05/28/2007    Past Surgical History:  Procedure Laterality Date  . ANTERIOR FUSION CERVICAL SPINE    . CARPAL TUNNEL RELEASE Left   . COLONOSCOPY    . CORONARY ANGIOPLASTY    . LEFT HEART CATHETERIZATION WITH CORONARY ANGIOGRAM N/A 05/29/2014   Procedure: LEFT HEART CATHETERIZATION WITH CORONARY ANGIOGRAM;  Surgeon: Sinclair Grooms, MD;  Location[facility] CATH LAB;  Service: Cardiovascular;  Laterality: N/A;  . 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   Procedure: TOTAL KNEE ARTHROPLASTY;  Surgeon: Susa Day, MD;  Location: [facility] ORS;  Service: Orthopedics;  Laterality: Left;  3 hrs  . TUBAL LIGATION    . WISDOM TOOTH EXTRACTION       OB History   No obstetric history on file.     Family History  Problem Relation Age of Onset  . Hypertension Mother   . Heart disease Mother   . Stroke Mother   . Prostate cancer Father   . Heart disease Brother   . Heart attack Sister     Social History   Tobacco Use  . Smoking  status: Former Smoker    Packs/day: 0.10    Years: 30.00    Pack years: 3.00    Quit date: 08/09/2003    Years since quitting: 16.7  . Smokeless tobacco: Never Used  Vaping Use  . Vaping Use: Never used  Substance Use Topics  . Alcohol use: No    Alcohol/week: 0.0 standard drinks  . Drug use: No    Home Medications Prior to Admission medications   Medication Sig Start Date End Date Taking? Authorizing Provider  albuterol (PROVENTIL HFA) 108 (90 BASE) MCG/ACT inhaler Inhale 2 puffs into the lungs every 6 (six) hours as needed for wheezing or shortness of breath. For shortness of breath and wheezing 11/08/11   Deneise Lever, MD  aspirin 81 MG chewable tablet Chew 1 tablet (81 mg  total) by mouth 2 (two) times daily. 02/07/20   Cecilie Kicks, PA-C  Carboxymethylcellulose Sodium (THERATEARS) 0.25 % SOLN Place 1 drop into both eyes 3 (three) times daily as needed (dry/irritated eyes.). Patient not taking: Reported on 02/19/2020    [provider]  diclofenac (VOLTAREN) 75 MG EC tablet Take 75 mg by mouth 2 (two) times daily.     [provider]  docusate sodium (COLACE) 100 MG capsule Take 1 capsule (100 mg total) by mouth 2 (two) times daily. Patient not taking: Reported on 02/19/2020 02/07/20   Cecilie Kicks, PA-C  fluticasone [facility]) 50 MCG/ACT nasal spray Place 2 sprays into both nostrils daily. Patient taking differently: Place 2 sprays into both nostrils daily as needed for allergies.  09/05/19   Julian Hy, DO  Fluticasone-Umeclidin-Vilant (TRELEGY ELLIPTA) 100-62.5-25 MCG/INH AEPB Inhale 1 puff into the lungs daily.    [provider]  Fluticasone-Umeclidin-Vilant (TRELEGY ELLIPTA) 200-62.5-25 MCG/INH AEPB Inhale 1 puff into the lungs daily. 10/01/19   Julian Hy, DO  furosemide (LASIX) 40 MG tablet Take 1 tablet (40 mg total) by mouth daily as needed for fluid. Patient taking differently: Take 40 mg by mouth daily as needed for fluid. Fluid retention. 03/15/18   Lendon Colonel, NP  insulin glargine (LANTUS) 100 UNIT/ML injection Inject 40 Units into the skin at bedtime.     [provider]  insulin lispro (HUMALOG) 100 UNIT/ML injection Inject 0-15 Units into the skin 3 (three) times daily as needed for high blood sugar. Sliding Scale Insulin (5 units for blood sugars greater than 200, 10 units for blood sugars greater than 300)    [provider]  ipratropium-albuterol (DUONEB) 0.5-2.5 (3) MG/3ML SOLN Take 3 mLs by nebulization every 4 (four) hours as needed. Patient taking differently: Take 3 mLs by nebulization every 4 (four) hours as needed (wheezing/shortness of breath.).  09/05/19   Julian Hy, DO    isosorbide mononitrate (IMDUR) 60 MG 24 hr tablet Take 1 tablet (60 mg total) by mouth daily. 06/26/19 06/20/20  Troy Sine, MD  levothyroxine (SYNTHROID) 100 MCG tablet Take 100 mcg by mouth daily before breakfast.     [provider]  lidocaine (XYLOCAINE) 2 % solution Use as directed 15 mLs in the mouth or throat every 4 (four) hours as needed for mouth pain. 04/24/20   Tacy Learn, PA-C  mepolizumab (NUCALA) 100 MG/ML SOSY Inject 100 mg into the skin every 30 (thirty) days.    [provider]  metoprolol succinate (TOPROL-XL) 25 MG 24 hr tablet TAKE 1 AND 1/2 TABLET BY MOUTH TWO TIMES A DAY 03/16/20   Shelva Majestic  A, MD  montelukast (SINGULAIR) 10 MG tablet Take 1 tablet (10 mg total) by mouth at bedtime. 09/05/19   Julian Hy, DO  NOVOTWIST 32G X 5 MM MISC Use as directed 05/04/11   [provider]  omega-3 acid ethyl esters (LOVAZA) 1 g capsule TAKE TWO CAPSULES BY MOUTH TWO TIMES A DAY 12/25/17   Troy Sine, MD  ONE TOUCH ULTRA TEST test strip Use as directed 05/05/11   [provider]  oxyCODONE (OXY IR/ROXICODONE) 5 MG immediate release tablet Take 1-2 tablets (5-10 mg total) by mouth every 6 (six) hours as needed for moderate pain (pain score 4-6). 02/07/20   Cecilie Kicks, PA-C  polyethylene glycol (MIRALAX / GLYCOLAX) 17 g packet Take 17 g by mouth daily as needed for mild constipation. Patient not taking: Reported on 02/19/2020 02/07/20   Cecilie Kicks, PA-C  pregabalin (LYRICA) 100 MG capsule Take 100 mg by mouth 2 (two) times daily.     [provider]  Respiratory Therapy Supplies (FLUTTER) DEVI 1 puff by Does not apply route daily. 10/01/19   Julian Hy, DO  rosuvastatin (CRESTOR) 5 MG tablet Take 1 tablet (5 mg total) by mouth daily. 06/26/19   Troy Sine, MD  triamterene-hydrochlorothiazide (MAXZIDE-25) 37.5-25 MG tablet Take 1 tablet by mouth daily. 06/26/19   Troy Sine, MD  verapamil (CALAN-SR) 120 MG CR  tablet Take 1 tablet (120 mg total) by mouth at bedtime. 06/26/19   Troy Sine, MD  VICTOZA 18 MG/3ML SOPN Inject 1.8 mg into the skin every evening.  12/12/18   [provider]  White Petrolatum-Mineral Oil (SYSTANE NIGHTTIME) OINT Apply 1 application to eye at bedtime.    [provider]    Allergies    Codeine, Levaquin [levofloxacin], Sulfa antibiotics, and Sulfonamide derivatives  Review of Systems   Review of Systems  HENT: Positive for mouth sores. Negative for trouble swallowing.   Respiratory: Negative for shortness of breath.   Gastrointestinal: Positive for diarrhea. Negative for abdominal pain, nausea and vomiting.  Musculoskeletal: Negative for neck pain and neck stiffness.  Skin: Negative for rash and wound.  Allergic/Immunologic: Positive for immunocompromised state.  Neurological: Negative for weakness.  Psychiatric/Behavioral: Negative for confusion.  All other systems reviewed and are negative.   Physical Exam Updated Vital Signs BP 122/81   Pulse 94   Temp 100 F (37.8 C) (Oral)   Resp 18   Ht 5\' 2"  (1.575 m)   Wt 84.2 kg   SpO2 98%   BMI 33.95 kg/m   Physical Exam Vitals and nursing note reviewed.  Constitutional:      General: She is not in acute distress.    Appearance: She is well-developed. She is not diaphoretic.  HENT:     Head: Normocephalic and atraumatic.     Comments: Purulent appearance to oral secretions, ulcerations noted to buccal mucosa, question area of thrush under tongue. Exam limited due to discomfort. Eyes:     Conjunctiva/sclera: Conjunctivae normal.  Pulmonary:     Effort: Pulmonary effort is normal.  Lymphadenopathy:     Cervical: No cervical adenopathy.  Skin:    General: Skin is warm and dry.     Findings: Rash present. No erythema.  Neurological:     Mental Status: She is alert and oriented to person, place, and time.  Psychiatric:        Behavior: Behavior normal.     ED Results / Procedures  /  Treatments   Labs (all labs ordered are listed, but only abnormal results are displayed) Labs Reviewed - No data to display  EKG [name]  Radiology No results found.  Procedures Procedures (including critical care time)  Medications Ordered in ED Medications - No data to display  ED Course  I have reviewed the triage vital signs and the nursing notes.  Pertinent labs & imaging results that were available during my care of the patient were reviewed by me and considered in my medical decision making (see chart for details).  Clinical Course as of Apr 25 1647  Fri Apr 25, 2759  129 64 year old female as above, currently taking Nystatin, Valtrex, Amoxicillin, Clindamycin, magic mouthwash. Lesions to mouth appear similar to as documented on ENT exam. No lesions noted to palms of hands or soles of feet. Diffuse rash to trunk consistent with drug rash. Reviewed with Dr. Karle Starch, ER attending, ENT note reviewed. Advised to stop the Amoxicillin. Will add viscous lidocaine, stressed liquid nutrition/hydration at this point. Advised against further IM steroids. Recommend contacting her ENT office, per notes, their plan was to recheck on Monday.   [LM]    Clinical Course User Index [LM] Roque Lias   MDM Rules/Calculators/A&P                          Final Clinical Impression(s) / ED Diagnoses Final diagnoses:  Stomatitis    Rx / DC Orders ED Discharge Orders         Ordered    lidocaine (XYLOCAINE) 2 % solution  Every 4 hours PRN        04/24/20 1627           Tacy Learn, PA-C 04/24/20 1648    Truddie Hidden, MD 04/24/20 1754

## 2020-04-24 NOTE — ED Triage Notes (Addendum)
Mouth lesions x 2 days. She also has had an allergic rash since yesterday. She was taking an antibiotic that her MD told her to stop due to rash.

## 2020-04-26 ENCOUNTER — Encounter (HOSPITAL_BASED_OUTPATIENT_CLINIC_OR_DEPARTMENT_OTHER): Payer: Self-pay | Admitting: Emergency Medicine

## 2020-04-26 ENCOUNTER — Other Ambulatory Visit: Payer: Self-pay

## 2020-04-26 ENCOUNTER — Inpatient Hospital Stay (HOSPITAL_BASED_OUTPATIENT_CLINIC_OR_DEPARTMENT_OTHER)
Admission: EM | Admit: 2020-04-26 | Discharge: 2020-04-29 | DRG: 596 | Disposition: A | Discharge status: Home / Self Care | Payer: 59 | Attending: Internal Medicine | Admitting: Internal Medicine

## 2020-04-26 DIAGNOSIS — T7840XA Allergy, unspecified, initial encounter: Secondary | ICD-10-CM | POA: Diagnosis not present

## 2020-04-26 DIAGNOSIS — G894 Chronic pain syndrome: Secondary | ICD-10-CM | POA: Diagnosis not present

## 2020-04-26 DIAGNOSIS — Z981 Arthrodesis status: Secondary | ICD-10-CM | POA: Diagnosis not present

## 2020-04-26 DIAGNOSIS — Z87891 Personal history of nicotine dependence: Secondary | ICD-10-CM | POA: Diagnosis not present

## 2020-04-26 DIAGNOSIS — Z7982 Long term (current) use of aspirin: Secondary | ICD-10-CM

## 2020-04-26 DIAGNOSIS — Z8249 Family history of ischemic heart disease and other diseases of the circulatory system: Secondary | ICD-10-CM

## 2020-04-26 DIAGNOSIS — Z79890 Hormone replacement therapy (postmenopausal): Secondary | ICD-10-CM

## 2020-04-26 DIAGNOSIS — E669 Obesity, unspecified: Secondary | ICD-10-CM | POA: Diagnosis not present

## 2020-04-26 DIAGNOSIS — I2510 Atherosclerotic heart disease of native coronary artery without angina pectoris: Secondary | ICD-10-CM | POA: Diagnosis not present

## 2020-04-26 DIAGNOSIS — Z885 Allergy status to narcotic agent status: Secondary | ICD-10-CM

## 2020-04-26 DIAGNOSIS — E785 Hyperlipidemia, unspecified: Secondary | ICD-10-CM | POA: Diagnosis not present

## 2020-04-26 DIAGNOSIS — E1122 Type 2 diabetes mellitus with diabetic chronic kidney disease: Secondary | ICD-10-CM | POA: Diagnosis present

## 2020-04-26 DIAGNOSIS — Z86010 Personal history of colonic polyps: Secondary | ICD-10-CM

## 2020-04-26 DIAGNOSIS — Z888 Allergy status to other drugs, medicaments and biological substances status: Secondary | ICD-10-CM

## 2020-04-26 DIAGNOSIS — I10 Essential (primary) hypertension: Secondary | ICD-10-CM | POA: Diagnosis present

## 2020-04-26 DIAGNOSIS — I129 Hypertensive chronic kidney disease with stage 1 through stage 4 chronic kidney disease, or unspecified chronic kidney disease: Secondary | ICD-10-CM | POA: Diagnosis present

## 2020-04-26 DIAGNOSIS — N179 Acute kidney failure, unspecified: Secondary | ICD-10-CM | POA: Diagnosis present

## 2020-04-26 DIAGNOSIS — Z96652 Presence of left artificial knee joint: Secondary | ICD-10-CM | POA: Diagnosis not present

## 2020-04-26 DIAGNOSIS — E1169 Type 2 diabetes mellitus with other specified complication: Secondary | ICD-10-CM | POA: Diagnosis present

## 2020-04-26 DIAGNOSIS — E119 Type 2 diabetes mellitus without complications: Secondary | ICD-10-CM

## 2020-04-26 DIAGNOSIS — N1831 Chronic kidney disease, stage 3a: Secondary | ICD-10-CM | POA: Diagnosis not present

## 2020-04-26 DIAGNOSIS — K219 Gastro-esophageal reflux disease without esophagitis: Secondary | ICD-10-CM | POA: Diagnosis present

## 2020-04-26 DIAGNOSIS — L512 Toxic epidermal necrolysis [Lyell]: Secondary | ICD-10-CM | POA: Diagnosis not present

## 2020-04-26 DIAGNOSIS — Z882 Allergy status to sulfonamides status: Secondary | ICD-10-CM

## 2020-04-26 DIAGNOSIS — E039 Hypothyroidism, unspecified: Secondary | ICD-10-CM | POA: Diagnosis present

## 2020-04-26 DIAGNOSIS — H109 Unspecified conjunctivitis: Secondary | ICD-10-CM | POA: Diagnosis not present

## 2020-04-26 DIAGNOSIS — G4733 Obstructive sleep apnea (adult) (pediatric): Secondary | ICD-10-CM | POA: Diagnosis present

## 2020-04-26 DIAGNOSIS — J45909 Unspecified asthma, uncomplicated: Secondary | ICD-10-CM | POA: Diagnosis present

## 2020-04-26 DIAGNOSIS — R21 Rash and other nonspecific skin eruption: Secondary | ICD-10-CM

## 2020-04-26 DIAGNOSIS — N183 Chronic kidney disease, stage 3 unspecified: Secondary | ICD-10-CM

## 2020-04-26 DIAGNOSIS — Z20822 Contact with and (suspected) exposure to covid-19: Secondary | ICD-10-CM | POA: Diagnosis present

## 2020-04-26 DIAGNOSIS — Z79899 Other long term (current) drug therapy: Secondary | ICD-10-CM | POA: Diagnosis not present

## 2020-04-26 DIAGNOSIS — Z6834 Body mass index (BMI) 34.0-34.9, adult: Secondary | ICD-10-CM

## 2020-04-26 DIAGNOSIS — Z794 Long term (current) use of insulin: Secondary | ICD-10-CM

## 2020-04-26 DIAGNOSIS — K121 Other forms of stomatitis: Secondary | ICD-10-CM

## 2020-04-26 LAB — COMPREHENSIVE METABOLIC PANEL
ALT: 12 U/L (ref 0–44)
AST: 19 U/L (ref 15–41)
Albumin: 3.2 g/dL — ABNORMAL LOW (ref 3.5–5.0)
Alkaline Phosphatase: 77 U/L (ref 38–126)
Anion gap: 12 (ref 5–15)
BUN: 44 mg/dL — ABNORMAL HIGH (ref 8–23)
CO2: 24 mmol/L (ref 22–32)
Calcium: 8.8 mg/dL — ABNORMAL LOW (ref 8.9–10.3)
Chloride: 100 mmol/L (ref 98–111)
Creatinine, Ser: 1.84 mg/dL — ABNORMAL HIGH (ref 0.44–1.00)
GFR calc Af Amer: 33 mL/min — ABNORMAL LOW (ref >60)
GFR calc non Af Amer: 28 mL/min — ABNORMAL LOW (ref >60)
Glucose, Bld: 141 mg/dL — ABNORMAL HIGH (ref 70–99)
Potassium: 4.8 mmol/L (ref 3.5–5.1)
Sodium: 136 mmol/L (ref 135–145)
Total Bilirubin: 0.4 mg/dL (ref 0.3–1.2)
Total Protein: 8.6 g/dL — ABNORMAL HIGH (ref 6.5–8.1)

## 2020-04-26 LAB — SEDIMENTATION RATE: Sed Rate: 115 mm/hr — ABNORMAL HIGH (ref 0–22)

## 2020-04-26 LAB — CBC WITH DIFFERENTIAL/PLATELET
Abs Immature Granulocytes: 0.02 10*3/uL (ref 0.00–0.07)
Basophils Absolute: 0 10*3/uL (ref 0.0–0.1)
Basophils Relative: 0 %
Eosinophils Absolute: 0.1 10*3/uL (ref 0.0–0.5)
Eosinophils Relative: 2 %
HCT: 30.4 % — ABNORMAL LOW (ref 36.0–46.0)
Hemoglobin: 9.1 g/dL — ABNORMAL LOW (ref 12.0–15.0)
Immature Granulocytes: 1 %
Lymphocytes Relative: 16 %
Lymphs Abs: 0.7 10*3/uL (ref 0.7–4.0)
MCH: 22.5 pg — ABNORMAL LOW (ref 26.0–34.0)
MCHC: 29.9 g/dL — ABNORMAL LOW (ref 30.0–36.0)
MCV: 75.2 fL — ABNORMAL LOW (ref 80.0–100.0)
Monocytes Absolute: 0.9 10*3/uL (ref 0.1–1.0)
Monocytes Relative: 22 %
Neutro Abs: 2.5 10*3/uL (ref 1.7–7.7)
Neutrophils Relative %: 59 %
Platelets: 420 10*3/uL — ABNORMAL HIGH (ref 150–400)
RBC: 4.04 MIL/uL (ref 3.87–5.11)
RDW: 16.8 % — ABNORMAL HIGH (ref 11.5–15.5)
WBC: 4.2 10*3/uL (ref 4.0–10.5)
nRBC: 0 % (ref 0.0–0.2)

## 2020-04-26 LAB — LACTIC ACID, PLASMA: Lactic Acid, Venous: 1.1 mmol/L (ref 0.5–1.9)

## 2020-04-26 LAB — GLUCOSE, CAPILLARY: Glucose-Capillary: 84 mg/dL (ref 70–99)

## 2020-04-26 LAB — SARS CORONAVIRUS 2 BY RT PCR (HOSPITAL ORDER, PERFORMED IN ~~LOC~~ HOSPITAL LAB): SARS Coronavirus 2: NEGATIVE

## 2020-04-26 LAB — C-REACTIVE PROTEIN: CRP: 16.3 mg/dL — ABNORMAL HIGH (ref <1.0)

## 2020-04-26 MED ORDER — INSULIN ASPART 100 UNIT/ML ~~LOC~~ SOLN: 0.0000 [IU] | SUBCUTANEOUS | Status: DC

## 2020-04-26 MED ORDER — SODIUM CHLORIDE 0.9 % IV SOLN: INTRAVENOUS | Status: DC

## 2020-04-26 MED ORDER — LEVOTHYROXINE SODIUM 100 MCG PO TABS
100.0000 ug | ORAL_TABLET | Freq: Every day | ORAL | Status: DC
  Administered 2020-04-28 – 2020-04-29 (×2): 100 ug via ORAL
  Filled 2020-04-26 (×3): qty 1

## 2020-04-26 MED ORDER — SODIUM CHLORIDE 0.9 % IV SOLN: Freq: Once | INTRAVENOUS | Status: AC

## 2020-04-26 MED ORDER — ENOXAPARIN SODIUM 30 MG/0.3ML ~~LOC~~ SOLN
30.0000 mg | SUBCUTANEOUS | Status: DC
  Administered 2020-04-26 – 2020-04-27 (×2): 30 mg via SUBCUTANEOUS
  Filled 2020-04-26 (×2): qty 0.3

## 2020-04-26 MED ORDER — SODIUM CHLORIDE 0.9 % IV BOLUS
1000.0000 mL | Freq: Once | INTRAVENOUS | Status: AC
  Administered 2020-04-26: 1000 mL via INTRAVENOUS

## 2020-04-26 MED ORDER — DIPHENHYDRAMINE HCL 50 MG/ML IJ SOLN
25.0000 mg | Freq: Once | INTRAMUSCULAR | Status: AC
  Administered 2020-04-26: 25 mg via INTRAVENOUS
  Filled 2020-04-26: qty 1

## 2020-04-26 MED ORDER — BISACODYL 10 MG RE SUPP: 10.0000 mg | Freq: Every day | RECTAL | Status: DC | PRN

## 2020-04-26 MED ORDER — ONDANSETRON HCL 4 MG/2ML IJ SOLN: 4.0000 mg | Freq: Four times a day (QID) | INTRAMUSCULAR | Status: DC | PRN

## 2020-04-26 MED ORDER — ONDANSETRON HCL 4 MG PO TABS: 4.0000 mg | ORAL_TABLET | Freq: Four times a day (QID) | ORAL | Status: DC | PRN

## 2020-04-26 MED ORDER — HYDROMORPHONE HCL 1 MG/ML IJ SOLN: 0.5000 mg | INTRAMUSCULAR | Status: DC | PRN

## 2020-04-26 MED ORDER — PREGABALIN 100 MG PO CAPS
100.0000 mg | ORAL_CAPSULE | Freq: Two times a day (BID) | ORAL | Status: DC
  Administered 2020-04-27 – 2020-04-29 (×4): 100 mg via ORAL
  Filled 2020-04-26 (×5): qty 1

## 2020-04-26 MED ORDER — DOCUSATE SODIUM 100 MG PO CAPS
100.0000 mg | ORAL_CAPSULE | Freq: Two times a day (BID) | ORAL | Status: DC
  Administered 2020-04-27 – 2020-04-29 (×4): 100 mg via ORAL
  Filled 2020-04-26 (×5): qty 1

## 2020-04-26 MED ORDER — FLUCONAZOLE 100MG IVPB
100.0000 mg | INTRAVENOUS | Status: DC
  Administered 2020-04-26 – 2020-04-28 (×3): 100 mg via INTRAVENOUS
  Filled 2020-04-26 (×5): qty 50

## 2020-04-26 NOTE — ED Provider Notes (Signed)
[name] Provider Note   CSN: [id number] Arrival date & time: 04/26/20  1125     History Chief Complaint  Patient presents with  . Rash    [name] is a 64 y.o. female.  Patient is a 64 year old female with history of hypertension, high cholesterol who presents to the ED for reevaluation of a rash, stomatitis. Patient with normal vitals. No fever. Patient developed an ulcer to her lower inner lip over a week ago. She was started on antibiotics with ciprofloxacin but things are not getting better. She was then started on Valtrex and amoxicillin with still minimal improvement. She was sent for ENT evaluation due to severe stomatitis. She saw them about 4 days ago and was started on clindamycin, nystatin, TobraDex. Was diagnosed with stomatitis. Has been using bacitracin to her lips as well. Later that day she developed rash to her chest and back and upper legs that has thus improved since stopping ciprofloxacin and amoxicillin after being seen in the ED on Friday for that. Left eye inflammed as well but improved while on tobradex. Continues to have discomfort in her lips. She was supposed to follow-up with ENT tomorrow but appointment has gotten pushed back now to the 28th. She is having hard time keeping up her nutrition due to pain and inflammation. Denies fever, chills.  The history is provided by the patient.       Past Medical History:  Diagnosis Date  . Acute bronchitis   . Anemia    hx of anemia  . Anginal pain ([place])   . Arthritis    osteo  . Asthma   . Coronary atherosclerosis   . Cough   . GERD (gastroesophageal reflux disease)   . History of colon polyps   . History of migraine   . Hyperlipidemia   . Hypertension   . Obstructive sleep apnea (adult) (pediatric)    NO CPAP   . Peripheral neuropathy   . Type II or unspecified type diabetes mellitus without mention of complication, not stated as uncontrolled    type 2  .  Unspecified hypothyroidism     Patient Active Problem List   Diagnosis Date Noted  . Allergic reaction 04/26/2020  . 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 Acute bronchitis 08/10/2014  . Unstable angina ([place]) 05/29/2014  . Abnormal nuclear stress test 05/29/2014  . Chest pain 05/06/2014  . Exertional shortness of breath 05/06/2014  . Mixed hyperlipidemia 05/06/2014  . Hyperlipidemia with target LDL less than 70 08/05/2013  . HTN (hypertension) 08/05/2013  . Chest wall pain 08/05/2013  . SINUSITIS, ACUTE 04/07/2009  . HYPOTHYROIDISM 12/10/2007  . DIABETES MELLITUS, TYPE II 05/28/2007  . Obstructive sleep apnea 05/28/2007  . Coronary atherosclerosis 05/28/2007  . Seasonal and perennial allergic rhinitis 05/28/2007  . COUGH, CHRONIC 05/28/2007    Past Surgical History:  Procedure Laterality Date  . ANTERIOR FUSION CERVICAL SPINE    . CARPAL TUNNEL RELEASE Left   . COLONOSCOPY    . CORONARY ANGIOPLASTY    . LEFT HEART CATHETERIZATION WITH CORONARY ANGIOGRAM N/A 05/29/2014   Procedure: LEFT HEART CATHETERIZATION WITH CORONARY ANGIOGRAM;  Surgeon: Sinclair Grooms, MD;  Location: [facility] CATH LAB;  Service: Cardiovascular;  Laterality: N/A;  . 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   Procedure: TOTAL KNEE ARTHROPLASTY;  Surgeon: Susa Day, MD;  Location: [facility] ORS;  Service: Orthopedics;  Laterality: Left;  3 hrs  . TUBAL LIGATION    . WISDOM TOOTH EXTRACTION  OB History   No obstetric history on file.     Family History  Problem Relation Age of Onset  . Hypertension Mother   . Heart disease Mother   . Stroke Mother   . Prostate cancer Father   . Heart disease Brother   . Heart attack Sister     Social History   Tobacco Use  . Smoking status: Former Smoker    Packs/day: 0.10    Years: 30.00    Pack years: 3.00    Quit date: 08/09/2003    Years since quitting: 16.7  . Smokeless tobacco: Never Used  Vaping Use  . Vaping Use: Never used  Substance Use Topics  . Alcohol  use: No    Alcohol/week: 0.0 standard drinks  . Drug use: No    Home Medications Prior to Admission medications   Medication Sig Start Date End Date Taking? Authorizing Provider  ciprofloxacin (CIPRO) 500 MG tablet Take 500 mg by mouth 2 (two) times daily.   Yes [provider]  clindamycin (CLEOCIN) 300 MG capsule Take 300 mg by mouth 3 (three) times daily.   Yes [provider]  tobramycin-dexamethasone (TOBRADEX) ophthalmic ointment 1 application 3 (three) times daily.   Yes [provider]  valACYclovir (VALTREX) 1000 MG tablet Take 1,000 mg by mouth 2 (two) times daily.   Yes [provider]  albuterol (PROVENTIL HFA) 108 (90 BASE) MCG/ACT inhaler Inhale 2 puffs into the lungs every 6 (six) hours as needed for wheezing or shortness of breath. For shortness of breath and wheezing 11/08/11   Deneise Lever, MD  aspirin 81 MG chewable tablet Chew 1 tablet (81 mg total) by mouth 2 (two) times daily. 02/07/20   Cecilie Kicks, PA-C  Carboxymethylcellulose Sodium (THERATEARS) 0.25 % SOLN Place 1 drop into both eyes 3 (three) times daily as needed (dry/irritated eyes.). Patient not taking: Reported on 02/19/2020    [provider]  diclofenac (VOLTAREN) 75 MG EC tablet Take 75 mg by mouth 2 (two) times daily.     [provider]  fluticasone (FLONASE) 50 MCG/ACT nasal spray Place 2 sprays into both nostrils daily. Patient taking differently: Place 2 sprays into both nostrils daily as needed for allergies.  09/05/19   Julian Hy, DO  Fluticasone-Umeclidin-Vilant (TRELEGY ELLIPTA) 100-62.5-25 MCG/INH AEPB Inhale 1 puff into the lungs daily.    [provider]  Fluticasone-Umeclidin-Vilant (TRELEGY ELLIPTA) 200-62.5-25 MCG/INH AEPB Inhale 1 puff into the lungs daily. 10/01/19   Julian Hy, DO  furosemide (LASIX) 40 MG tablet Take 1 tablet (40 mg total) by mouth daily as needed for fluid. Patient taking differently: Take 40 mg by  mouth daily as needed for fluid. Fluid retention. 03/15/18   Lendon Colonel, NP  insulin glargine (LANTUS) 100 UNIT/ML injection Inject 40 Units into the skin at bedtime.     [provider]  insulin lispro (HUMALOG) 100 UNIT/ML injection Inject 0-15 Units into the skin 3 (three) times daily as needed for high blood sugar. Sliding Scale Insulin (5 units for blood sugars greater than 200, 10 units for blood sugars greater than 300)    [provider]  ipratropium-albuterol (DUONEB) 0.5-2.5 (3) MG/3ML SOLN Take 3 mLs by nebulization every 4 (four) hours as needed. Patient taking differently: Take 3 mLs by nebulization every 4 (four) hours as needed (wheezing/shortness of breath.).  09/05/19   Julian Hy, DO  isosorbide mononitrate (IMDUR) 60 MG 24 hr tablet Take 1  tablet (60 mg total) by mouth daily. 06/26/19 06/20/20  Troy Sine, MD  levothyroxine (SYNTHROID) 100 MCG tablet Take 100 mcg by mouth daily before breakfast.     [provider]  lidocaine (XYLOCAINE) 2 % solution Use as directed 15 mLs in the mouth or throat every 4 (four) hours as needed for mouth pain. 04/24/20   Tacy Learn, PA-C  mepolizumab (NUCALA) 100 MG/ML SOSY Inject 100 mg into the skin every 30 (thirty) days.    [provider]  metoprolol succinate (TOPROL-XL) 25 MG 24 hr tablet TAKE 1 AND 1/2 TABLET BY MOUTH TWO TIMES A DAY 03/16/20   Troy Sine, MD  montelukast (SINGULAIR) 10 MG tablet Take 1 tablet (10 mg total) by mouth at bedtime. 09/05/19   Julian Hy, DO  NOVOTWIST 32G X 5 MM MISC Use as directed 05/04/11   [provider]  omega-3 acid ethyl esters (LOVAZA) 1 g capsule TAKE TWO CAPSULES BY MOUTH TWO TIMES A DAY 12/25/17   Troy Sine, MD  ONE TOUCH ULTRA TEST test strip Use as directed 05/05/11   [provider]  pregabalin (LYRICA) 100 MG capsule Take 100 mg by mouth 2 (two) times daily.     [provider]  Respiratory Therapy Supplies  (FLUTTER) DEVI 1 puff by Does not apply route daily. 10/01/19   Julian Hy, DO  rosuvastatin (CRESTOR) 5 MG tablet Take 1 tablet (5 mg total) by mouth daily. 06/26/19   Troy Sine, MD  triamterene-hydrochlorothiazide (MAXZIDE-25) 37.5-25 MG tablet Take 1 tablet by mouth daily. 06/26/19   Troy Sine, MD  verapamil (CALAN-SR) 120 MG CR tablet Take 1 tablet (120 mg total) by mouth at bedtime. 06/26/19   Troy Sine, MD  VICTOZA 18 MG/3ML SOPN Inject 1.8 mg into the skin every evening.  12/12/18   [provider]  White Petrolatum-Mineral Oil (SYSTANE NIGHTTIME) OINT Apply 1 application to eye at bedtime.    [provider]    Allergies    Codeine, Levaquin [levofloxacin], Sulfa antibiotics, and Sulfonamide derivatives  Review of Systems   Review of Systems  Constitutional: Negative for chills and fever.  HENT: Positive for mouth sores. Negative for ear pain, sore throat, trouble swallowing and voice change.   Eyes: Negative for pain and visual disturbance.  Respiratory: Negative for cough and shortness of breath.   Cardiovascular: Negative for chest pain and palpitations.  Gastrointestinal: Negative for abdominal pain and vomiting.  Genitourinary: Negative for dysuria and hematuria.  Musculoskeletal: Negative for arthralgias and back pain.  Skin: Positive for color change and rash.  Neurological: Negative for seizures and syncope.  All other systems reviewed and are negative.   Physical Exam Updated Vital Signs  ED Triage Vitals  Enc Vitals Group     BP 04/26/20 1151 140/74     Pulse Rate 04/26/20 1151 82     Resp 04/26/20 1151 20     Temp 04/26/20 1151 (!) 96.9 F (36.1 C)     Temp Source 04/26/20 1151 Axillary     SpO2 04/26/20 1151 97 %     Weight 04/26/20 1147 185 lb 10 oz (84.2 kg)     Height 04/26/20 1147 5\' 2"  (1.575 m)     Head Circumference --      Peak Flow --      Pain Score 04/26/20 1147 10     Pain Loc --      Pain Edu? --  Excl. in [place]? --     Physical Exam Vitals and nursing note reviewed.  Constitutional:      General: She is not in acute distress.    Appearance: She is well-developed.  HENT:     Head: Normocephalic and atraumatic.     Mouth/Throat:     Mouth: Mucous membranes are dry.     Comments: From what I can see of the tongue overall without any ulcers or lesions, gumline looks okay, there is dryness of the lips with severe tightness/weeping Eyes:     Conjunctiva/sclera: Conjunctivae normal.  Cardiovascular:     Rate and Rhythm: Normal rate and regular rhythm.     Heart sounds: No murmur heard.   Pulmonary:     Effort: Pulmonary effort is normal. No respiratory distress.     Breath sounds: Normal breath sounds.  Abdominal:     Palpations: Abdomen is soft.     Tenderness: There is no abdominal tenderness.  Musculoskeletal:     Cervical back: Neck supple.  Skin:    General: Skin is warm and dry.     Findings: Rash present.     Comments: Macular papular rash throughout chest and back and upper legs, there is no sloughing off of the skin in these areas  Neurological:     Mental Status: She is alert.     ED Results / Procedures / Treatments   Labs (all labs ordered are listed, but only abnormal results are displayed) Labs Reviewed  CBC WITH DIFFERENTIAL/PLATELET - Abnormal; Notable for the following components:      Result Value   Hemoglobin 9.1 (*)    HCT 30.4 (*)    MCV 75.2 (*)    MCH 22.5 (*)    MCHC 29.9 (*)    RDW 16.8 (*)    Platelets 420 (*)    All other components within normal limits  COMPREHENSIVE METABOLIC PANEL - Abnormal; Notable for the following components:   Glucose, Bld 141 (*)    BUN 44 (*)    Creatinine, Ser 1.84 (*)    Calcium 8.8 (*)    Total Protein 8.6 (*)    Albumin 3.2 (*)    GFR calc non Af Amer 28 (*)    GFR calc Af Amer 33 (*)    All other components within normal limits  SEDIMENTATION RATE - Abnormal; Notable for the following components:    Sed Rate 115 (*)    All other components within normal limits  SARS CORONAVIRUS 2 BY RT PCR ([place])  LACTIC ACID, PLASMA  C-REACTIVE PROTEIN  LACTIC ACID, PLASMA    EKG None  Radiology No results found.  Procedures Procedures (including critical care time)  Medications Ordered in ED Medications  sodium chloride 0.9 % bolus 1,000 mL (0 mLs Intravenous Stopped 04/26/20 1509)  diphenhydrAMINE (BENADRYL) injection 25 mg (25 mg Intravenous Given 04/26/20 1245)    ED Course  I have reviewed the triage vital signs and the nursing notes.  Pertinent labs & imaging results that were available during my care of the patient were reviewed by me and considered in my medical decision making (see chart for details).    MDM Rules/Calculators/A&P                          [name] is a 64 year old female with history of hypertension, high cholesterol presents to the ED with mouth pain.  Patient with normal vitals.  No fever.  Has been dealing with mouth pain, lip rash for over the past week. Left eye inflammation also. Has been to the emergency department several times and also followed up with ear nose and throat.  Currently she is on clindamycin, Valtrex, TobraDex for left eye conjutivitis, nystatin.  She had previously been on Cipro and amoxicillin for mouth ulcer that started over a week ago.  Despite being on antibiotics mouth/lip lesions got worse.  Patient was started on Valtrex and followed up with ENT after being seen by primary doctor.  ENT prescribed clindamycin, TobraDex, nystatin and there is not much improvement.  On Thursday she developed hives throughout her chest and back.  Was seen in the ED for that 2 days ago and was suspected to be possibly drug rash.  At that time ciprofloxacin and amoxicillin was stopped.  Thus the rash has come to stabilize and has not progressed. Left eye doesn't have obvious conjuctivitis and per patient  improved. Supposedly patient finished long course of antibiotics a week or two before lip ulcer. 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  Talked with Dr. Constance Holster with ENT.  Let him know that patient was here and that I was not sure if this was Katherina Right as a timeline is, murky.  He will see the patient if she is admitted.  Lab work shows mild AKI with a creatinine of 1.8.  Otherwise no significant leukocytosis or anemia.  Sed rate is 115.  Lactic acid is normal.  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  After further discussion with the patient prior to tongue and lip inflammation patient had finished antibiotics about 2 weeks prior.  She then had some conjunctivitis and now with a diffuse rash that is macular papular.  Suspect that this is Katherina Right and she would benefit from supportive care.  I called multiple medical centers including [facility] and there is no wait list and they are not accepting any patients.  We do not have dermatology in our system however I talked with hospitalist and they are willing to admit the patient for supportive care and to consider lateral transfer if needed.  At this time I believe just stopping antibiotics and let him her body get hydration and nutrition will improve.  She is hemodynamically stable at this time.  To be admitted to hospitalist service for further care.  This chart was dictated using voice recognition software.  Despite best efforts to proofread,  errors can occur which can change the documentation meaning.    Final Clinical Impression(s) / ED Diagnoses Final diagnoses:  Rash  Stomatitis  AKI (acute kidney injury) [facility])    Rx / DC Orders ED Discharge Orders    None       Lennice Sites, DO 04/26/20 1528

## 2020-04-26 NOTE — ED Notes (Signed)
ED Provider at bedside. 

## 2020-04-26 NOTE — ED Notes (Signed)
Called [facility] spoke to [place] will page Hospitalist

## 2020-04-26 NOTE — H&P (Signed)
[facility] History & Physical  Kelly Splinter MD  [name] 04/26/2020  Chief Complaint: Dr. Ronnald Nian, A.  HPI: The patient is a 64 y.o. year-old w/ hx of HTN and HL presented to ED for rash and mouth ulcers/ pain. This hx obtained directly from the patient by the undersigned this evening >>   04/20/20 - pt seen by PCP Dr Forde Dandy after developed an ulcer on her left lower lip.  She was seen and given po abx Augmentin. She took the abx.   04/22/20 - pt was getting worse so she saw PCP again. Per pt augmentin was stopped and cipro started along w/ valtrex she rec'd a "shot of steroids" and was sent to ENT. Seen by ENT the same day, they added clindamycin po abx and antibiotic eye drops  04/23/20 - pt broke out in a rash on her chest / back.  She was told to stop cipro. She continued clinda/ valtrex and the eyedrops  04/24/20 - pt's mouth and rash were worse and went back to ED who sent her to ENT.    04/25/20 - pt stopped the clindamycin herself, did not feel she was doing any better. That evening she also took a Diflucan pill which she had left over from a prior illness and says today that she feels the Diflucan has helped " a lot"  04/26/20 - pt came back to ED today because she is unable to eat and is feeling weak and her mouth and rash also aren't getting better.  We are asked to see for admission.   Pt seen in room. History is as above.  Other hx is that pt was L TKA in June this year and had some wound healing problems which required 1-2 months of po abx per the patient/ her sister, maybe Keflex, not exactly sure the name.    Pt has sig issues w/ unstable asthma, has been on "dozens" of po steroids over many years.  She was started on monthly injections for this in April this year.  Her asthma has been under much better control since starting the injections per the patient.   No hx heart disease.  Does have some CKD w/ baseline creat around 1.4.    ROS  denies CP  no joint pain    no HA  no blurry vision  no rash  no diarrhea  no nausea/ vomiting  no dysuria  no difficulty voiding  no change in urine color   Past Medical History  Past Medical History:  Diagnosis Date  . Acute bronchitis   . Anemia    hx of anemia  . Anginal pain ([place])   . Arthritis    osteo  . Asthma   . Coronary atherosclerosis   . Cough   . GERD (gastroesophageal reflux disease)   . History of colon polyps   . History of migraine   . Hyperlipidemia   . Hypertension   . Obstructive sleep apnea (adult) (pediatric)    NO CPAP   . Peripheral neuropathy   . Type II or unspecified type diabetes mellitus without mention of complication, not stated as uncontrolled    type 2  . Unspecified hypothyroidism    Past Surgical History  Past Surgical History:  Procedure Laterality Date  . ANTERIOR FUSION CERVICAL SPINE    . CARPAL TUNNEL RELEASE Left   . COLONOSCOPY    . CORONARY ANGIOPLASTY    . LEFT HEART CATHETERIZATION WITH CORONARY ANGIOGRAM N/A  05/29/2014   Procedure: LEFT HEART CATHETERIZATION WITH CORONARY ANGIOGRAM;  Surgeon: Sinclair Grooms, MD;  Location: [facility] CATH LAB;  Service: Cardiovascular;  Laterality: N/A;  . 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   Procedure: TOTAL KNEE ARTHROPLASTY;  Surgeon: Susa Day, MD;  Location: [facility] ORS;  Service: Orthopedics;  Laterality: Left;  3 hrs  . TUBAL LIGATION    . WISDOM TOOTH EXTRACTION     Family History  Family History  Problem Relation Age of Onset  . Hypertension Mother   . Heart disease Mother   . Stroke Mother   . Prostate cancer Father   . Heart disease Brother   . Heart attack Sister    Social History  reports that she quit smoking about 16 years ago. She has a 3.00 pack-year smoking history. She has never used smokeless tobacco. She reports that she does not drink alcohol and does not use drugs. Allergies  Allergies  Allergen Reactions  . Codeine Other (See Comments)    REACTION: hallucinations/"loopy"  .  Levaquin [Levofloxacin] Nausea And Vomiting  . Sulfa Antibiotics Hives  . Sulfonamide Derivatives Hives   Home medications Prior to Admission medications   Medication Sig Start Date End Date Taking? Authorizing Provider  ciprofloxacin (CIPRO) 500 MG tablet Take 500 mg by mouth 2 (two) times daily.   Yes [provider]  clindamycin (CLEOCIN) 300 MG capsule Take 300 mg by mouth 3 (three) times daily.   Yes [provider]  tobramycin-dexamethasone (TOBRADEX) ophthalmic ointment 1 application 3 (three) times daily.   Yes [provider]  valACYclovir (VALTREX) 1000 MG tablet Take 1,000 mg by mouth 2 (two) times daily.   Yes [provider]  albuterol (PROVENTIL HFA) 108 (90 BASE) MCG/ACT inhaler Inhale 2 puffs into the lungs every 6 (six) hours as needed for wheezing or shortness of breath. For shortness of breath and wheezing 11/08/11   Baird Lyons D, MD  allopurinol (ZYLOPRIM) 300 MG tablet Take 300 mg by mouth daily. 04/03/20   [provider]  aspirin 81 MG chewable tablet Chew 1 tablet (81 mg total) by mouth 2 (two) times daily. 02/07/20   Cecilie Kicks, PA-C  Carboxymethylcellulose Sodium (THERATEARS) 0.25 % SOLN Place 1 drop into both eyes 3 (three) times daily as needed (dry/irritated eyes.). Patient not taking: Reported on 02/19/2020    [provider]  colchicine 0.6 MG tablet Take 0.6 mg by mouth 2 (two) times daily as needed. 02/25/20   [provider]  diazepam (VALIUM) 5 MG tablet Take 5 mg by mouth daily as needed. 02/16/20   [provider]  diclofenac (VOLTAREN) 75 MG EC tablet Take 75 mg by mouth 2 (two) times daily.     [provider]  diclofenac Sodium (VOLTAREN) 1 % GEL Apply 1 application topically 4 (four) times daily as needed. 01/01/20   [provider]  fluticasone (FLONASE) 50 MCG/ACT nasal spray Place 2 sprays into both nostrils daily. Patient taking differently: Place 2 sprays into  both nostrils daily as needed for allergies.  09/05/19   Julian Hy, DO  Fluticasone-Umeclidin-Vilant (TRELEGY ELLIPTA) 100-62.5-25 MCG/INH AEPB Inhale 1 puff into the lungs daily.    [provider]  Fluticasone-Umeclidin-Vilant (TRELEGY ELLIPTA) 200-62.5-25 MCG/INH AEPB Inhale 1 puff into the lungs daily. 10/01/19   Julian Hy, DO  furosemide (LASIX) 40 MG tablet Take 1 tablet (40 mg total) by mouth daily as needed for fluid. Patient taking differently: Take 40 mg  by mouth daily as needed for fluid. Fluid retention. 03/15/18   Lendon Colonel, NP  insulin glargine (LANTUS) 100 UNIT/ML injection Inject 40 Units into the skin at bedtime.     [provider]  insulin lispro (HUMALOG) 100 UNIT/ML injection Inject 0-15 Units into the skin 3 (three) times daily as needed for high blood sugar. Sliding Scale Insulin (5 units for blood sugars greater than 200, 10 units for blood sugars greater than 300)    [provider]  ipratropium-albuterol (DUONEB) 0.5-2.5 (3) MG/3ML SOLN Take 3 mLs by nebulization every 4 (four) hours as needed. Patient taking differently: Take 3 mLs by nebulization every 4 (four) hours as needed (wheezing/shortness of breath.).  09/05/19   Julian Hy, DO  isosorbide mononitrate (IMDUR) 60 MG 24 hr tablet Take 1 tablet (60 mg total) by mouth daily. 06/26/19 06/20/20  Troy Sine, MD  levothyroxine (SYNTHROID) 100 MCG tablet Take 100 mcg by mouth daily before breakfast.     [provider]  lidocaine (XYLOCAINE) 2 % solution Use as directed 15 mLs in the mouth or throat every 4 (four) hours as needed for mouth pain. 04/24/20   Tacy Learn, PA-C  mepolizumab (NUCALA) 100 MG/ML SOSY Inject 100 mg into the skin every 30 (thirty) days.    [provider]  metoprolol succinate (TOPROL-XL) 25 MG 24 hr tablet TAKE 1 AND 1/2 TABLET BY MOUTH TWO TIMES A DAY 03/16/20   Troy Sine, MD  montelukast (SINGULAIR) 10 MG tablet Take 1  tablet (10 mg total) by mouth at bedtime. 09/05/19   Julian Hy, DO  NOVOTWIST 32G X 5 MM MISC Use as directed 05/04/11   [provider]  nystatin (MYCOSTATIN) 100000 UNIT/ML suspension Take 5 mLs by mouth 4 (four) times daily. 04/22/20   [provider]  nystatin cream (MYCOSTATIN) Apply 1 application topically daily as needed. 01/02/20   [provider]  omega-3 acid ethyl esters (LOVAZA) 1 g capsule TAKE TWO CAPSULES BY MOUTH TWO TIMES A DAY 12/25/17   Troy Sine, MD  ONE TOUCH ULTRA TEST test strip Use as directed 05/05/11   [provider]  pregabalin (LYRICA) 100 MG capsule Take 100 mg by mouth 2 (two) times daily.     [provider]  Respiratory Therapy Supplies (FLUTTER) DEVI 1 puff by Does not apply route daily. 10/01/19   Julian Hy, DO  rosuvastatin (CRESTOR) 5 MG tablet Take 1 tablet (5 mg total) by mouth daily. 06/26/19   Troy Sine, MD  tobramycin-dexamethasone [facility]) ophthalmic solution Place 1 drop into both eyes 4 (four) times daily. 04/22/20   [provider]  traMADol (ULTRAM) 50 MG tablet Take 50 mg by mouth 3 (three) times daily as needed. 03/30/20   [provider]  triamcinolone cream (KENALOG) 0.1 % Apply 1 application topically daily as needed. 01/02/20   [provider]  triamterene-hydrochlorothiazide (MAXZIDE-25) 37.5-25 MG tablet Take 1 tablet by mouth daily. 06/26/19   Troy Sine, MD  verapamil (CALAN-SR) 120 MG CR tablet Take 1 tablet (120 mg total) by mouth at bedtime. 06/26/19   Troy Sine, MD  VICTOZA 18 MG/3ML SOPN Inject 1.8 mg into the skin every evening.  12/12/18   [provider]  White Petrolatum-Mineral Oil (SYSTANE NIGHTTIME) OINT Apply 1 application to eye at bedtime.    [provider]       Exam Gen alert, pleasant adult female, no distress  Bad blistering w/ mild bleeding bilat upper and lower lips, some crusting over of the L lower lip.  Cannot open her eye due to pain of her lips sticking together, could not examine the oropharynx.  Diffuse maculopapular red rash over the abdomen and chest with some mild blistering in the mid abdomen, and also over the back w/ more clustering of lesions in the mid lower back but w/o blistering. Upper legs are involved but less intensity of the rash here.  Sclera anicteric, mild conjunctival reddening, no blistering of the conjunctiva  No jvd or bruits Chest clear bilat throughout RRR no MRG Abd soft ntnd no mass or ascites +bs GU defer MS no joint effusions or deformity Ext no LE edema, small dry eschar below L knee, no drainage Neuro is alert, Ox 3 , nf    Home meds:  - cipor 500 bid/ clindamycin 300 tid/ tobradex ophthalmic 1 app tid/ valtrex 1 gm bid/ nystatin 5 cc qid  - verapamil 171m hs/ triamterene- hctz 1 qd/ metoprolol xl 264m1.5 tab qd/ imdur 60 qd/ lasix 40 qd prn/ asa 81/ crestor 5 qd  - tramadol 50 tid prn/ lyrica 100 bid/ valium 61m29mrn/ voltaren 75 bid/ voltaren gel prn  - trelegy ellipta 1 puff qd/ duoneb qid prn/ singulair 10 hs - mepolizumab 100m86m q 30days  - synthroid 100 ug/ colchicine 0.6 bid prn/ zyloprim 300 qd  - victoza 1.8 mg sq qhs/ insulin lantus 40u hs/ humalog 0-15 ssi ac  - prn's/ vitamins/ supplements    Na 136  K 4.8  CO2 24  BUN 44  Cr 1.84  AG 12  Alb 3.2  Tprot 8.6     eGFR 30  WBC 4.2  Hb 9.1  plt 420     BP 130 / 78  HR 71  Temp 97- 98   RR 16  RA sat 100%    B/l creat from June/ July 2021 = 1.36- 1.44, eGFR 44- 48   Assessment/ Plan: 1. Stomatitis - process started w/ a L lip blister but now has sig diffuse blistering of the lips, mouth pain and MP rash of the torso and less so the upper legs. Pt is unable to swallow and needs hydration for mild AKI and observation of the stomatitis. Possible initial lesion was herpetic w/ crusting over of the L lower lip on exam today. Rash and stomatitis suspected drug reaction. Possible SJS, if so would  estimate about 20 % skin coverage involved. Some lesions of the upper back and upper stomach and legs are resolving but lesions in the mid abd / mid back are still active and coalescing. Mild blistering in mid abdomen. Worse activity is the mouth/ lips, unable to open mouth for exam today.  ENT was called by ED and should be following her in the hospital according to ED notes. Plan is to stop all her po abx and antiviral medications and watch on IVF"s.  Will give diflucan ( IV) for now as she feels it helped her mouth when she started it (old pill she had from a few months ago) last night and unable to r/o thrush on exam tonight.   2. AoCKD 3a - b/l creat 1.4, eGFR 46.  +nsaids and dehydration from #1.  Cont IVF"s , repeat labs in am. Hold diuretics and nsaids.  3. HTN - BP's okay here , will hold po BP meds for now.  4. IDDM - will use SSI q 4h for  now, can add in Lantus if needed (on 40u per day at home). Pt not able to eat solids but can drink if she can "get her mouth open".  IVF's. BS 143 on admit.  5. H/o asthma - getting injections (mepolizumab) monthly starting in April 6. HL - hold po meds for now 7. Pain - low dose IV pain meds for now d/t difficulty swallowing, cont lyrica if able to swallow 8. Hypothyroid - cont synthroid if can swallow 9. Dehydration - IVF's 10. Gout - hold po meds      Kelly Splinter  MD 04/26/2020, 6:39 PM

## 2020-04-26 NOTE — ED Notes (Signed)
Over week ago ulcer in mouth given antibiotics given augmentin, seen ENT Wednesday given different cipro, valacyclovir,tobramycin dexamethasone and clindamycin.  Thursday mouth ulcers increased. Full body rash onset Thursday.

## 2020-04-26 NOTE — ED Triage Notes (Signed)
Pt returns for continues to have rash all over body with increase in mouth sores.

## 2020-04-27 ENCOUNTER — Ambulatory Visit: Payer: 59 | Admitting: Physical Therapy

## 2020-04-27 DIAGNOSIS — E039 Hypothyroidism, unspecified: Secondary | ICD-10-CM | POA: Diagnosis present

## 2020-04-27 DIAGNOSIS — T7840XD Allergy, unspecified, subsequent encounter: Secondary | ICD-10-CM | POA: Diagnosis not present

## 2020-04-27 DIAGNOSIS — I2510 Atherosclerotic heart disease of native coronary artery without angina pectoris: Secondary | ICD-10-CM | POA: Diagnosis present

## 2020-04-27 DIAGNOSIS — Z7982 Long term (current) use of aspirin: Secondary | ICD-10-CM | POA: Diagnosis not present

## 2020-04-27 DIAGNOSIS — Z20822 Contact with and (suspected) exposure to covid-19: Secondary | ICD-10-CM | POA: Diagnosis present

## 2020-04-27 DIAGNOSIS — H109 Unspecified conjunctivitis: Secondary | ICD-10-CM | POA: Diagnosis present

## 2020-04-27 DIAGNOSIS — L512 Toxic epidermal necrolysis [Lyell]: Secondary | ICD-10-CM | POA: Diagnosis not present

## 2020-04-27 DIAGNOSIS — N1831 Chronic kidney disease, stage 3a: Secondary | ICD-10-CM | POA: Diagnosis present

## 2020-04-27 DIAGNOSIS — I129 Hypertensive chronic kidney disease with stage 1 through stage 4 chronic kidney disease, or unspecified chronic kidney disease: Secondary | ICD-10-CM | POA: Diagnosis present

## 2020-04-27 DIAGNOSIS — E785 Hyperlipidemia, unspecified: Secondary | ICD-10-CM | POA: Diagnosis present

## 2020-04-27 DIAGNOSIS — E669 Obesity, unspecified: Secondary | ICD-10-CM | POA: Diagnosis present

## 2020-04-27 DIAGNOSIS — E1122 Type 2 diabetes mellitus with diabetic chronic kidney disease: Secondary | ICD-10-CM | POA: Diagnosis present

## 2020-04-27 DIAGNOSIS — R7989 Other specified abnormal findings of blood chemistry: Secondary | ICD-10-CM | POA: Diagnosis not present

## 2020-04-27 DIAGNOSIS — Z86010 Personal history of colonic polyps: Secondary | ICD-10-CM | POA: Diagnosis not present

## 2020-04-27 DIAGNOSIS — G4733 Obstructive sleep apnea (adult) (pediatric): Secondary | ICD-10-CM | POA: Diagnosis present

## 2020-04-27 DIAGNOSIS — E1169 Type 2 diabetes mellitus with other specified complication: Secondary | ICD-10-CM | POA: Diagnosis present

## 2020-04-27 DIAGNOSIS — K219 Gastro-esophageal reflux disease without esophagitis: Secondary | ICD-10-CM | POA: Diagnosis present

## 2020-04-27 DIAGNOSIS — I10 Essential (primary) hypertension: Secondary | ICD-10-CM | POA: Diagnosis not present

## 2020-04-27 DIAGNOSIS — K121 Other forms of stomatitis: Secondary | ICD-10-CM | POA: Diagnosis present

## 2020-04-27 DIAGNOSIS — R21 Rash and other nonspecific skin eruption: Secondary | ICD-10-CM | POA: Diagnosis not present

## 2020-04-27 DIAGNOSIS — Z8249 Family history of ischemic heart disease and other diseases of the circulatory system: Secondary | ICD-10-CM | POA: Diagnosis not present

## 2020-04-27 DIAGNOSIS — G894 Chronic pain syndrome: Secondary | ICD-10-CM | POA: Diagnosis present

## 2020-04-27 DIAGNOSIS — N179 Acute kidney failure, unspecified: Secondary | ICD-10-CM | POA: Diagnosis present

## 2020-04-27 DIAGNOSIS — Z981 Arthrodesis status: Secondary | ICD-10-CM | POA: Diagnosis not present

## 2020-04-27 DIAGNOSIS — Z96652 Presence of left artificial knee joint: Secondary | ICD-10-CM | POA: Diagnosis present

## 2020-04-27 DIAGNOSIS — Z79899 Other long term (current) drug therapy: Secondary | ICD-10-CM | POA: Diagnosis not present

## 2020-04-27 DIAGNOSIS — T7840XA Allergy, unspecified, initial encounter: Secondary | ICD-10-CM | POA: Diagnosis not present

## 2020-04-27 DIAGNOSIS — J45909 Unspecified asthma, uncomplicated: Secondary | ICD-10-CM | POA: Diagnosis present

## 2020-04-27 DIAGNOSIS — Z87891 Personal history of nicotine dependence: Secondary | ICD-10-CM | POA: Diagnosis not present

## 2020-04-27 LAB — URINALYSIS, ROUTINE W REFLEX MICROSCOPIC
Bilirubin Urine: NEGATIVE
Glucose, UA: 50 mg/dL — AB
Hgb urine dipstick: NEGATIVE
Ketones, ur: 5 mg/dL — AB
Leukocytes,Ua: NEGATIVE
Nitrite: NEGATIVE
Protein, ur: NEGATIVE mg/dL
Specific Gravity, Urine: 1.016 (ref 1.005–1.030)
pH: 6 (ref 5.0–8.0)

## 2020-04-27 LAB — CBC
HCT: 34.9 % — ABNORMAL LOW (ref 36.0–46.0)
Hemoglobin: 10.3 g/dL — ABNORMAL LOW (ref 12.0–15.0)
MCH: 22.7 pg — ABNORMAL LOW (ref 26.0–34.0)
MCHC: 29.5 g/dL — ABNORMAL LOW (ref 30.0–36.0)
MCV: 76.9 fL — ABNORMAL LOW (ref 80.0–100.0)
Platelets: 391 10*3/uL (ref 150–400)
RBC: 4.54 MIL/uL (ref 3.87–5.11)
RDW: 17.2 % — ABNORMAL HIGH (ref 11.5–15.5)
WBC: 4.8 10*3/uL (ref 4.0–10.5)
nRBC: 0 % (ref 0.0–0.2)

## 2020-04-27 LAB — BASIC METABOLIC PANEL
Anion gap: 12 (ref 5–15)
BUN: 33 mg/dL — ABNORMAL HIGH (ref 8–23)
CO2: 21 mmol/L — ABNORMAL LOW (ref 22–32)
Calcium: 8.8 mg/dL — ABNORMAL LOW (ref 8.9–10.3)
Chloride: 105 mmol/L (ref 98–111)
Creatinine, Ser: 1.31 mg/dL — ABNORMAL HIGH (ref 0.44–1.00)
GFR calc Af Amer: 50 mL/min — ABNORMAL LOW (ref >60)
GFR calc non Af Amer: 43 mL/min — ABNORMAL LOW (ref >60)
Glucose, Bld: 88 mg/dL (ref 70–99)
Potassium: 4.6 mmol/L (ref 3.5–5.1)
Sodium: 138 mmol/L (ref 135–145)

## 2020-04-27 LAB — GLUCOSE, CAPILLARY
Glucose-Capillary: 102 mg/dL — ABNORMAL HIGH (ref 70–99)
Glucose-Capillary: 140 mg/dL — ABNORMAL HIGH (ref 70–99)
Glucose-Capillary: 151 mg/dL — ABNORMAL HIGH (ref 70–99)
Glucose-Capillary: 181 mg/dL — ABNORMAL HIGH (ref 70–99)
Glucose-Capillary: 185 mg/dL — ABNORMAL HIGH (ref 70–99)
Glucose-Capillary: 67 mg/dL — ABNORMAL LOW (ref 70–99)
Glucose-Capillary: 80 mg/dL (ref 70–99)
Glucose-Capillary: 83 mg/dL (ref 70–99)

## 2020-04-27 LAB — HEMOGLOBIN A1C
Hgb A1c MFr Bld: 7.1 % — ABNORMAL HIGH (ref 4.8–5.6)
Mean Plasma Glucose: 157.07 mg/dL

## 2020-04-27 LAB — LACTIC ACID, PLASMA: Lactic Acid, Venous: 1.8 mmol/L (ref 0.5–1.9)

## 2020-04-27 LAB — HIV ANTIBODY (ROUTINE TESTING W REFLEX): HIV Screen 4th Generation wRfx: NONREACTIVE

## 2020-04-27 MED ORDER — GLUCOSE 40 % PO GEL
ORAL | Status: AC
  Filled 2020-04-27: qty 1

## 2020-04-27 MED ORDER — DIPHENHYDRAMINE HCL 50 MG/ML IJ SOLN
12.5000 mg | Freq: Three times a day (TID) | INTRAMUSCULAR | Status: DC | PRN
  Administered 2020-04-27 – 2020-04-28 (×3): 12.5 mg via INTRAVENOUS
  Filled 2020-04-27 (×3): qty 1

## 2020-04-27 MED ORDER — DIAZEPAM 2 MG PO TABS: 2.0000 mg | ORAL_TABLET | Freq: Two times a day (BID) | ORAL | Status: DC | PRN

## 2020-04-27 MED ORDER — LORAZEPAM 2 MG/ML IJ SOLN: 0.5000 mg | Freq: Four times a day (QID) | INTRAMUSCULAR | Status: DC | PRN

## 2020-04-27 MED ORDER — METOPROLOL TARTRATE 12.5 MG HALF TABLET
12.5000 mg | ORAL_TABLET | Freq: Two times a day (BID) | ORAL | Status: DC
  Administered 2020-04-27 – 2020-04-29 (×5): 12.5 mg via ORAL
  Filled 2020-04-27 (×5): qty 1

## 2020-04-27 MED ORDER — MONTELUKAST SODIUM 10 MG PO TABS
10.0000 mg | ORAL_TABLET | Freq: Every day | ORAL | Status: DC
  Administered 2020-04-27 – 2020-04-28 (×2): 10 mg via ORAL
  Filled 2020-04-27 (×2): qty 1

## 2020-04-27 MED ORDER — POLYVINYL ALCOHOL 1.4 % OP SOLN
1.0000 [drp] | Freq: Three times a day (TID) | OPHTHALMIC | Status: DC
  Administered 2020-04-27 – 2020-04-29 (×6): 1 [drp] via OPHTHALMIC
  Filled 2020-04-27: qty 15

## 2020-04-27 MED ORDER — IPRATROPIUM-ALBUTEROL 0.5-2.5 (3) MG/3ML IN SOLN: 3.0000 mL | RESPIRATORY_TRACT | Status: DC | PRN

## 2020-04-27 MED ORDER — DEXTROSE 50 % IV SOLN
12.5000 g | INTRAVENOUS | Status: DC | PRN
  Administered 2020-04-27: 12.5 g via INTRAVENOUS
  Filled 2020-04-27: qty 50

## 2020-04-27 NOTE — Progress Notes (Signed)
Hypoglycemic Event  CBG: 67  Treatment: Offered glucose gel, patient refused, too painful for po intake. Administered 12.5gm D50 via IVP.   Symptoms: None  Follow-up CBG: Time: 0853 CBG Result:140  Delay in recheck d/t MD in room w/ patient, patient using written communication.   Possible Reasons for Event: Patient unable to tolerate po intake d/t painful mouth ulcers. Will cont to monitor and treat as needed.   Comments/MD notified: None    Fredderick Erb

## 2020-04-27 NOTE — Progress Notes (Signed)
PROGRESS NOTE  [name] NBV:[id number] DOB: [birth year]   PCP: Reynold Bowen, MD  Patient is from: Home.  DOA: 04/26/2020 LOS: 0  Brief Narrative / Interim history: 64 year old female with history of HTN, HLD, CKD-3A, IDDM-2, asthma, hypothyroidism and gout presenting with skin rash and mouth ulcer.  She was started on allopurinol about 2 months ago.  She presented to PCP on 9/13 with an ulcer on her lower lip and started on p.o. Augmentin.  She returned to PCP office on 9/15 due to worsening and started on Cipro and Valtrex.  She also received a steroid injection.  Augmentin was discontinued.  She was referred to ENT.  She was seen by ENT the same day, and clindamycin and antibiotic eye ointments were added.  Patient broke out in rash on her chest and back on 9/16.  She was advised to stop Cipro but continue clindamycin, Valtrex and eyedrops.  Mowzoon rash gotten worse and she went to ED on 9/17 and sent to ENT.  She stopped clindamycin on 9/18 and took her leftover oral Diflucan that helped a lot.  Eventually, she came to ED as she because the rash was not getting better and she was weak as she was not able to eat.  Patient denies history of cancer.  In ED, afebrile.  Hemodynamically stable.  97% on RA.  WBC 4.2. Hgb 9.1.  Differential normal without eosinophilia.  Cr 1.84.  BUN 44.  CRP 16.3.  ESR 115.  COVID-19 PCR negative.  Started on IV Diflucan, and admitted for stomatitis and skin rash and acute on chronic kidney disease.  The next day, AKI/azotemia improved.  Oral ulcer and skin rash about the same.  Subjective: Seen and examined earlier this morning.  No major events overnight of this morning.  Still with pain and difficulty opening her mouth due to oral ulcers.  She cleaned the skin rash is about the same.  Skin rash is pruritic.  Denies chest pain or cough.  Reports some nasal congestion, itchy eyes and eye watering.  Vision is not quite clear.  Objective: Vitals:    04/27/20 0500 04/27/20 0623 04/27/20 1057 04/27/20 1332  BP:  139/68 133/70 123/79  Pulse:  83 78 87  Resp:  17 16 18   Temp:  98.3 F (36.8 C) 97.9 F (36.6 C) 98.2 F (36.8 C)  TempSrc:  Oral  Oral  SpO2:  97% 97% 99%  Weight: 84.6 kg     Height:        Intake/Output Summary (Last 24 hours) at 04/27/2020 1425 Last data filed at 04/27/2020 0900 Gross per 24 hour  Intake 591.98 ml  Output 500 ml  Net 91.98 ml   Filed Weights   04/26/20 1147 04/27/20 0500  Weight: 84.2 kg 84.6 kg    Examination:  GENERAL: No apparent distress.  Nontoxic. HEENT: Significant stomatitis. Not able to open her mouth for exam.  Mild palpable conjunctival injection.  No corneal clouding.  PERRL.  Endorses photophobia. NECK: Supple.  No apparent JVD.  RESP: On room air.  No IWOB.  Fair aeration bilaterally. CVS:  RRR. Heart sounds normal.  ABD/GI/GU: BS+. Abd soft, NTND.  MSK/EXT:  Moves extremities. No apparent deformity. No edema.  SKIN: Diffuse skin rash including a chest, back, torso and upper and lower extremities.  See pictures below for more. NEURO: Awake, alert and oriented appropriately.  No apparent focal neuro deficit. PSYCH: Calm. Normal affect.  Procedures:  None  Microbiology summarized: COVID-19 PCR negative.  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  Recent Cipro and Augmentin could contribute.  Denies history of cancer.  No new respiratory issues.  She has difficulty opening her mouth for p.o. intake.  Renal function and azotemia improving. SCORTEN score 2, so can be monitored here with IV fluid and symptomatic care.  Low threshold for transfer to ICU/tertiary center if worse. -Discontinue/avoid offending medications-allopurinol. -Continue IV fluid given difficulty taking p.o. -IV Benadryl 12.5 mg every 8 hours as needed for itching -Avoid steroids.  No  indication for antibiotics. -Check HSV 1/2 IgM.  HIV nonreactive. -Portable chest x-ray -Continue IV Diflucan-but doubt this would help although she felt better with it.   AKI on CKD-3A/azotemia:: Baseline Cr 1.4 with EGFR of 46>> 1.84 (admit)> 1.31.  BUN down to 31.  She is on multiple NSAIDs. -Continue IV fluid -Stop home NSAIDs  Uncontrolled IDDM-2 with hypoglycemia: A1c 7.1%. Recent Labs  Lab 04/27/20 0013 04/27/20 0404 04/27/20 0726 04/27/20 0853 04/27/20 1158  GLUCAP 80 83 67* 140* 102*  -Continue SSI moderate  Essential hypertension: Normotensive off home Maxzide, metoprolol, Cardizem -Resume home metoprolol at lower dose  Anemia of renal disease: H&H stable -We will check anemia panel in the morning  History of asthma-likely moderate persistent.  On monthly mepolizumab injection and LABA/LAMA/ICS.  Stable. -We will do nebulizers   Dehydration due to poor p.o. intake -Continue IV fluid  History of gout-no active flareup -Discontinue allopurinol. -Hold colchicine for now  Chronic pain syndrome: Stable. -Continue Lyrica and as needed Dilaudid  Conjunctivitis: Likely due to #1.  No evidence of bacterial infection -Supportive eyedrops  Hypothyroidism -We will resume p.o. Synthroid when able to take  Class II obesity Body mass index is 34.11 kg/m.         DVT prophylaxis:  enoxaparin (LOVENOX) injection 30 mg Start: 04/26/20 2200  Code Status: Full code Family Communication: Updated patient's twin sister at bedside Status is: Observation  The patient will require care spanning > 2 midnights and should be moved to inpatient because: Ongoing active pain requiring inpatient pain management, IV treatments appropriate due to intensity of illness or inability to take PO, Inpatient level of care appropriate due to severity of illness and Remains on IV fluid due to inability to take p.o.  Dispo: The patient is from: Home              Anticipated d/c is to:  Home              Anticipated d/c date is: 2 days              Patient currently is not medically stable to d/c.       Consultants:  None   Sch Meds:  Scheduled Meds: . Carboxymethylcellulose Sodium  1 drop Both Eyes TID  . dextrose      . docusate sodium  100 mg Oral BID  . enoxaparin (LOVENOX) injection  30 mg Subcutaneous Q24H  . insulin aspart  0-15 Units Subcutaneous Q4H  . levothyroxine  100 mcg Oral Q0600  . metoprolol tartrate  12.5 mg Oral BID  . montelukast  10 mg Oral QHS  . pregabalin  100 mg Oral BID   Continuous Infusions: . sodium chloride 100 mL/hr at 04/27/20 0848  . fluconazole (DIFLUCAN) IV Stopped (04/26/20 2242)   PRN Meds:.bisacodyl, dextrose, diphenhydrAMINE, HYDROmorphone (DILAUDID) injection,  ipratropium-albuterol, LORazepam, ondansetron **OR** ondansetron (ZOFRAN) IV  Antimicrobials: Anti-infectives (From admission, onward)   Start     Dose/Rate Route Frequency Ordered Stop   04/26/20 2200  fluconazole (DIFLUCAN) IVPB 100 mg        100 mg 50 mL/hr over 60 Minutes Intravenous Every 24 hours 04/26/20 2021         I have personally reviewed the following labs and images: CBC: Recent Labs  Lab 04/26/20 1240 04/27/20 0350  WBC 4.2 4.8  NEUTROABS 2.5  --   HGB 9.1* 10.3*  HCT 30.4* 34.9*  MCV 75.2* 76.9*  PLT 420* 391   BMP &GFR Recent Labs  Lab 04/26/20 1240 04/27/20 0350  NA 136 138  K 4.8 4.6  CL 100 105  CO2 24 21*  GLUCOSE 141* 88  BUN 44* 33*  CREATININE 1.84* 1.31*  CALCIUM 8.8* 8.8*   Estimated Creatinine Clearance: 43.8 mL/min (A) (by C-G formula based on SCr of 1.31 mg/dL (H)). Liver & Pancreas: Recent Labs  Lab 04/26/20 1240  AST 19  ALT 12  ALKPHOS 77  BILITOT 0.4  PROT 8.6*  ALBUMIN 3.2*   No results for input(s): LIPASE, AMYLASE in the last 168 hours. No results for input(s): AMMONIA in the last 168 hours. Diabetic: Recent Labs    04/27/20 0350  HGBA1C 7.1*   Recent Labs  Lab 04/27/20 0013  04/27/20 0404 04/27/20 0726 04/27/20 0853 04/27/20 1158  GLUCAP 80 83 67* 140* 102*   Cardiac Enzymes: No results for input(s): CKTOTAL, CKMB, CKMBINDEX, TROPONINI in the last 168 hours. No results for input(s): PROBNP in the last 8760 hours. Coagulation Profile: No results for input(s): INR, PROTIME in the last 168 hours. Thyroid Function Tests: No results for input(s): TSH, T4TOTAL, FREET4, T3FREE, THYROIDAB in the last 72 hours. Lipid Profile: No results for input(s): CHOL, HDL, LDLCALC, TRIG, CHOLHDL, LDLDIRECT in the last 72 hours. Anemia Panel: No results for input(s): VITAMINB12, FOLATE, FERRITIN, TIBC, IRON, RETICCTPCT in the last 72 hours. Urine analysis:    Component Value Date/Time   COLORURINE YELLOW 04/27/2020 1335   APPEARANCEUR CLEAR 04/27/2020 1335   LABSPEC 1.016 04/27/2020 1335   PHURINE 6.0 04/27/2020 1335   GLUCOSEU 50 (A) 04/27/2020 1335   HGBUR NEGATIVE 04/27/2020 1335   BILIRUBINUR NEGATIVE 04/27/2020 1335   KETONESUR 5 (A) 04/27/2020 1335   PROTEINUR NEGATIVE 04/27/2020 1335   NITRITE NEGATIVE 04/27/2020 1335   LEUKOCYTESUR NEGATIVE 04/27/2020 1335   Sepsis Labs: Invalid input(s): PROCALCITONIN, [place]  Microbiology: Recent Results (from the past 240 hour(s))  SARS Coronavirus 2 by RT PCR (hospital order, performed in [facility] hospital lab) Nasopharyngeal Nasopharyngeal Swab     Status: None   Collection Time: 04/26/20  3:10 PM   Specimen: Nasopharyngeal Swab  Result Value Ref Range Status   SARS Coronavirus 2 NEGATIVE NEGATIVE Final    Comment: (NOTE) SARS-CoV-2 target nucleic acids are NOT DETECTED.  The SARS-CoV-2 RNA is generally detectable in upper and lower respiratory specimens during the acute phase of infection. The lowest concentration of SARS-CoV-2 viral copies this assay can detect is 250 copies / mL. A negative result does not preclude SARS-CoV-2 infection and should not be used as the sole basis for treatment or  other patient management decisions.  A negative result may occur with improper specimen collection / handling, submission of specimen other than nasopharyngeal swab, presence of viral mutation(s) within the areas targeted by this assay, and inadequate number of viral copies (<250 copies /  mL). A negative result must be combined with clinical observations, patient history, and epidemiological information.  Fact Sheet for Patients:   StrictlyIdeas.no  Fact Sheet for Healthcare Providers: BankingDealers.co.za  This test is not yet approved or  cleared by the [place] FDA and has been authorized for detection and/or diagnosis of SARS-CoV-2 by FDA under an Emergency Use Authorization (EUA).  This EUA will remain in effect (meaning this test can be used) for the duration of the COVID-19 declaration under Section 564(b)(1) of the Act, 21 U.S.C. section 360bbb-3(b)(1), unless the authorization is terminated or revoked sooner.  Performed at [facility], [address] [place], [place] [address]     Radiology Studies: No results found.    Kellan Raffield T. [place]  If 7PM-7AM, please contact night-coverage [email or web address] 04/27/2020, 2:25 PM

## 2020-04-28 DIAGNOSIS — R7989 Other specified abnormal findings of blood chemistry: Secondary | ICD-10-CM

## 2020-04-28 DIAGNOSIS — I10 Essential (primary) hypertension: Secondary | ICD-10-CM

## 2020-04-28 DIAGNOSIS — L512 Toxic epidermal necrolysis [Lyell]: Principal | ICD-10-CM

## 2020-04-28 LAB — GLUCOSE, CAPILLARY
Glucose-Capillary: 112 mg/dL — ABNORMAL HIGH (ref 70–99)
Glucose-Capillary: 120 mg/dL — ABNORMAL HIGH (ref 70–99)
Glucose-Capillary: 123 mg/dL — ABNORMAL HIGH (ref 70–99)
Glucose-Capillary: 131 mg/dL — ABNORMAL HIGH (ref 70–99)
Glucose-Capillary: 134 mg/dL — ABNORMAL HIGH (ref 70–99)
Glucose-Capillary: 153 mg/dL — ABNORMAL HIGH (ref 70–99)

## 2020-04-28 LAB — RENAL FUNCTION PANEL
Albumin: 2.9 g/dL — ABNORMAL LOW (ref 3.5–5.0)
Anion gap: 10 (ref 5–15)
BUN: 20 mg/dL (ref 8–23)
CO2: 21 mmol/L — ABNORMAL LOW (ref 22–32)
Calcium: 8.7 mg/dL — ABNORMAL LOW (ref 8.9–10.3)
Chloride: 107 mmol/L (ref 98–111)
Creatinine, Ser: 0.99 mg/dL (ref 0.44–1.00)
GFR calc Af Amer: >60 mL/min (ref >60)
GFR calc non Af Amer: >60 mL/min (ref >60)
Glucose, Bld: 138 mg/dL — ABNORMAL HIGH (ref 70–99)
Phosphorus: 2.9 mg/dL (ref 2.5–4.6)
Potassium: 4.4 mmol/L (ref 3.5–5.1)
Sodium: 138 mmol/L (ref 135–145)

## 2020-04-28 LAB — CBC
HCT: 28.8 % — ABNORMAL LOW (ref 36.0–46.0)
Hemoglobin: 8.6 g/dL — ABNORMAL LOW (ref 12.0–15.0)
MCH: 22.6 pg — ABNORMAL LOW (ref 26.0–34.0)
MCHC: 29.9 g/dL — ABNORMAL LOW (ref 30.0–36.0)
MCV: 75.8 fL — ABNORMAL LOW (ref 80.0–100.0)
Platelets: 375 10*3/uL (ref 150–400)
RBC: 3.8 MIL/uL — ABNORMAL LOW (ref 3.87–5.11)
RDW: 17 % — ABNORMAL HIGH (ref 11.5–15.5)
WBC: 4.8 10*3/uL (ref 4.0–10.5)
nRBC: 0 % (ref 0.0–0.2)

## 2020-04-28 LAB — MAGNESIUM: Magnesium: 2 mg/dL (ref 1.7–2.4)

## 2020-04-28 LAB — HSV(HERPES SIMPLEX VRS) I + II AB-IGM: HSVI/II Comb IgM: <0.91 Ratio (ref 0.00–0.90)

## 2020-04-28 MED ORDER — ENOXAPARIN SODIUM 40 MG/0.4ML ~~LOC~~ SOLN
40.0000 mg | SUBCUTANEOUS | Status: DC
  Administered 2020-04-28: 40 mg via SUBCUTANEOUS
  Filled 2020-04-28: qty 0.4

## 2020-04-28 MED ORDER — FLUTICASONE PROPIONATE 50 MCG/ACT NA SUSP
1.0000 | Freq: Every day | NASAL | Status: DC
  Administered 2020-04-29 (×2): 1 via NASAL
  Filled 2020-04-28: qty 16

## 2020-04-28 NOTE — Progress Notes (Signed)
Patient is requesting Flonase or equivalent. Paged Dr. Sharlet Salina.

## 2020-04-28 NOTE — Progress Notes (Signed)
PROGRESS NOTE  [name]:[id number] DOB: 1955/08/20   PCP: Reynold Bowen, MD  Patient is from: Home.  DOA: 04/26/2020 LOS: 1  Brief Narrative / Interim history: 64 year old female with history of HTN, HLD, IDDM-2, asthma, hypothyroidism and gout presenting with skin rash and mouth ulcer.  She was started on allopurinol about 2 months ago.  She presented to PCP on 9/13 with an ulcer on her lower lip and started on p.o. Augmentin.  She returned to PCP office on 9/15 due to worsening and started on Cipro and Valtrex.  She also received a steroid injection.  Augmentin was discontinued.  She was referred to ENT.  She was seen by ENT the same day, and clindamycin and antibiotic eye ointments were added.  Patient broke out in rash on her chest and back on 9/16.  She was advised to stop Cipro but continue clindamycin, Valtrex and eyedrops.  Mowzoon rash gotten worse and she went to ED on 9/17 and sent to ENT.  She stopped clindamycin on 9/18 and took her leftover oral Diflucan that helped a lot.  Eventually, she came to ED as she because the rash was not getting better and she was weak as she was not able to eat.  Patient denies history of cancer.  In ED, afebrile.  Hemodynamically stable.  97% on RA.  WBC 4.2. Hgb 9.1.  Differential normal without eosinophilia.  Cr 1.84.  BUN 44.  CRP 16.3.  ESR 115.  COVID-19 PCR negative.  Started on IV Diflucan, and admitted for stomatitis and skin rash and acute on chronic kidney disease.  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   Oral ulcer and skin rash improving. AKI/azotemia resolved.  Overall, she feels better.  On IV fluid.  Subjective: Seen and examined earlier this morning.  No major events overnight of this morning.  Feels better.  Able to open her bowels and takes liquid and soft diet.  Still with some pruritus.  Benadryl helping.  Skin right seems to be crusting except over right  lower back.   Objective: Vitals:   04/27/20 2232 04/28/20 0546 04/28/20 0553 04/28/20 1342  BP: (!) 162/76 (!) 143/72  (!) 141/64  Pulse: 85 82  63  Resp: 18 18  15   Temp: 98.2 F (36.8 C) 98.3 F (36.8 C)  98.4 F (36.9 C)  TempSrc: Axillary Oral  Oral  SpO2: 100% 97%  96%  Weight:   83.7 kg   Height:        Intake/Output Summary (Last 24 hours) at 04/28/2020 1417 Last data filed at 04/28/2020 1341 Gross per 24 hour  Intake 3035.89 ml  Output 1100 ml  Net 1935.89 ml   Filed Weights   04/26/20 1147 04/27/20 0500 04/28/20 0553  Weight: 84.2 kg 84.6 kg 83.7 kg    Examination:  GENERAL: No apparent distress.  Nontoxic. HEENT: Stomatitis with crusting.  Able to open her mouth today.  Mild palpebral conjunctivitis.  NECK: Supple.  No apparent JVD.  RESP: On room air.  No IWOB.  Fair aeration bilaterally. CVS:  RRR. Heart sounds normal.  ABD/GI/GU: BS+. Abd soft, NTND.  MSK/EXT:  Moves extremities. No apparent deformity. No edema.  SKIN: Diffuse erythematous skin rash over right chest, torso, back and proximal extremities.  Blisters over right mid and lower back.  Most of the right seems to be crusting.  NEURO: Awake, alert and oriented appropriately.  No apparent focal  neuro deficit. PSYCH: Calm. Normal affect.  Procedures:  None  Microbiology summarized: COVID-19 PCR negative.  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  Recent Cipro and Augmentin could contribute.  Denies history of cancer.  No new respiratory issues.  AKI and azotemia resolved.  Stomatitis and the skin lesion improving. SCORTEN score 2. -Discontinued allopurinol and added to her allergy list. -Continue IV fluid -IV Benadryl 12.5 mg every 8 hours as needed for itching -Avoid steroids.  No indication for antibiotics. -Follow-up HSV 1/2 IgM.  HIV nonreactive. -Change IV to p.o. Diflucan although I doubt utility but she thinks it is  helping her stomatitis. -Advance diet to full liquid  AKI/azotemia: Resolved.  Maybe she does not have CKD-3A: Cr  1.84 (admit)> 1.31> 0.99.  AKI could be due to poor p.o. intake in the setting of stomatitis, TEN and/or NSAIDs. -Continue IV fluid -Stop home NSAIDs  Uncontrolled IDDM-2 with hypoglycemia: A1c 7.1%. Recent Labs  Lab 04/27/20 2017 04/27/20 2352 04/28/20 0359 04/28/20 0741 04/28/20 1204  GLUCAP 185* 151* 131* 112* 134*  -Continue SSI moderate  Essential hypertension: Normotensive off home Maxzide, metoprolol, Cardizem -Resume home metoprolol at lower dose  Anemia of renal disease: Slight drop in H&H likely dilutional from IV fluid.  She was dehydrated on arrival. -Anemia panel -Continue monitoring  History of asthma-likely moderate persistent.  On monthly mepolizumab injection and LABA/LAMA/ICS.  Stable. -Nebulizer here  Dehydration due to poor p.o. intake -Continue IV fluid  History of gout-no active flareup -Discontinued allopurinol and added to allergy list -Hold colchicine for now  Chronic pain syndrome: Stable. -Continue Lyrica and as needed Dilaudid  Conjunctivitis: Likely due to #1.  No evidence of bacterial infection.  Improved. -eyedrops  Hypothyroidism -Resume home Synthroid.  Class II obesity Body mass index is 33.75 kg/m.         DVT prophylaxis:  enoxaparin (LOVENOX) injection 40 mg Start: 04/28/20 2200  Code Status: Full code Family Communication: Updated patient's twin sister at bedside on 9/20 Status is: Observation  The patient will require care spanning > 2 midnights and should be moved to inpatient because: Ongoing active pain requiring inpatient pain management, IV treatments appropriate due to intensity of illness or inability to take PO, Inpatient level of care appropriate due to severity of illness and Remains on IV fluid due to inability to take p.o.  Dispo: The patient is from: Home              Anticipated d/c is to:  Home              Anticipated d/c date is: 2 days              Patient currently is not medically stable to d/c.       Consultants:  None   Sch Meds:  Scheduled Meds: . docusate sodium  100 mg Oral BID  . enoxaparin (LOVENOX) injection  40 mg Subcutaneous Q24H  . insulin aspart  0-15 Units Subcutaneous Q4H  . levothyroxine  100 mcg Oral Q0600  . metoprolol tartrate  12.5 mg Oral BID  . montelukast  10 mg Oral QHS  . polyvinyl alcohol  1 drop Both Eyes TID  . pregabalin  100 mg Oral BID   Continuous Infusions: . sodium chloride 100 mL/hr at 04/28/20 0551  . fluconazole (DIFLUCAN) IV Stopped (04/27/20 2322)   PRN Meds:.bisacodyl, dextrose, diazepam, diphenhydrAMINE, HYDROmorphone (DILAUDID) injection, ipratropium-albuterol, ondansetron **OR** ondansetron (ZOFRAN)  IV  Antimicrobials: Anti-infectives (From admission, onward)   Start     Dose/Rate Route Frequency Ordered Stop   04/26/20 2200  fluconazole (DIFLUCAN) IVPB 100 mg        100 mg 50 mL/hr over 60 Minutes Intravenous Every 24 hours 04/26/20 2021         I have personally reviewed the following labs and images: CBC: Recent Labs  Lab 04/26/20 1240 04/27/20 0350 04/28/20 0349  WBC 4.2 4.8 4.8  NEUTROABS 2.5  --   --   HGB 9.1* 10.3* 8.6*  HCT 30.4* 34.9* 28.8*  MCV 75.2* 76.9* 75.8*  PLT 420* 391 375   BMP &GFR Recent Labs  Lab 04/26/20 1240 04/27/20 0350 04/28/20 0349  NA 136 138 138  K 4.8 4.6 4.4  CL 100 105 107  CO2 24 21* 21*  GLUCOSE 141* 88 138*  BUN 44* 33* 20  CREATININE 1.84* 1.31* 0.99  CALCIUM 8.8* 8.8* 8.7*  MG  --   --  2.0  PHOS  --   --  2.9   Estimated Creatinine Clearance: 57.5 mL/min (by C-G formula based on SCr of 0.99 mg/dL). Liver & Pancreas: Recent Labs  Lab 04/26/20 1240 04/28/20 0349  AST 19  --   ALT 12  --   ALKPHOS 77  --   BILITOT 0.4  --   PROT 8.6*  --   ALBUMIN 3.2* 2.9*   No results for input(s): LIPASE, AMYLASE in the last 168 hours. No results  for input(s): AMMONIA in the last 168 hours. Diabetic: Recent Labs    04/27/20 0350  HGBA1C 7.1*   Recent Labs  Lab 04/27/20 2017 04/27/20 2352 04/28/20 0359 04/28/20 0741 04/28/20 1204  GLUCAP 185* 151* 131* 112* 134*   Cardiac Enzymes: No results for input(s): CKTOTAL, CKMB, CKMBINDEX, TROPONINI in the last 168 hours. No results for input(s): PROBNP in the last 8760 hours. Coagulation Profile: No results for input(s): INR, PROTIME in the last 168 hours. Thyroid Function Tests: No results for input(s): TSH, T4TOTAL, FREET4, T3FREE, THYROIDAB in the last 72 hours. Lipid Profile: No results for input(s): CHOL, HDL, LDLCALC, TRIG, CHOLHDL, LDLDIRECT in the last 72 hours. Anemia Panel: No results for input(s): VITAMINB12, FOLATE, FERRITIN, TIBC, IRON, RETICCTPCT in the last 72 hours. Urine analysis:    Component Value Date/Time   COLORURINE YELLOW 04/27/2020 1335   APPEARANCEUR CLEAR 04/27/2020 1335   LABSPEC 1.016 04/27/2020 1335   PHURINE 6.0 04/27/2020 1335   GLUCOSEU 50 (A) 04/27/2020 1335   HGBUR NEGATIVE 04/27/2020 1335   BILIRUBINUR NEGATIVE 04/27/2020 1335   KETONESUR 5 (A) 04/27/2020 1335   PROTEINUR NEGATIVE 04/27/2020 1335   NITRITE NEGATIVE 04/27/2020 1335   LEUKOCYTESUR NEGATIVE 04/27/2020 1335   Sepsis Labs: Invalid input(s): PROCALCITONIN, [place]  Microbiology: Recent Results (from the past 240 hour(s))  SARS Coronavirus 2 by RT PCR (hospital order, performed in [facility] hospital lab) Nasopharyngeal Nasopharyngeal Swab     Status: None   Collection Time: 04/26/20  3:10 PM   Specimen: Nasopharyngeal Swab  Result Value Ref Range Status   SARS Coronavirus 2 NEGATIVE NEGATIVE Final    Comment: (NOTE) SARS-CoV-2 target nucleic acids are NOT DETECTED.  The SARS-CoV-2 RNA is generally detectable in upper and lower respiratory specimens during the acute phase of infection. The lowest concentration of SARS-CoV-2 viral copies this assay can detect is  250 copies / mL. A negative result does not preclude SARS-CoV-2 infection and should not be used as the  sole basis for treatment or other patient management decisions.  A negative result may occur with improper specimen collection / handling, submission of specimen other than nasopharyngeal swab, presence of viral mutation(s) within the areas targeted by this assay, and inadequate number of viral copies (<250 copies / mL). A negative result must be combined with clinical observations, patient history, and epidemiological information.  Fact Sheet for Patients:   StrictlyIdeas.no  Fact Sheet for Healthcare Providers: BankingDealers.co.za  This test is not yet approved or  cleared by the [place] FDA and has been authorized for detection and/or diagnosis of SARS-CoV-2 by FDA under an Emergency Use Authorization (EUA).  This EUA will remain in effect (meaning this test can be used) for the duration of the COVID-19 declaration under Section 564(b)(1) of the Act, 21 U.S.C. section 360bbb-3(b)(1), unless the authorization is terminated or revoked sooner.  Performed at [facility], [address] [place], [place] [address]     Radiology Studies: No results found.    Cedrica Brune T. [place]  If 7PM-7AM, please contact night-coverage [email or web address] 04/28/2020, 2:17 PM

## 2020-04-29 DIAGNOSIS — T7840XD Allergy, unspecified, subsequent encounter: Secondary | ICD-10-CM

## 2020-04-29 LAB — RENAL FUNCTION PANEL
Albumin: 2.9 g/dL — ABNORMAL LOW (ref 3.5–5.0)
Anion gap: 10 (ref 5–15)
BUN: 15 mg/dL (ref 8–23)
CO2: 20 mmol/L — ABNORMAL LOW (ref 22–32)
Calcium: 8.8 mg/dL — ABNORMAL LOW (ref 8.9–10.3)
Chloride: 109 mmol/L (ref 98–111)
Creatinine, Ser: 0.97 mg/dL (ref 0.44–1.00)
GFR calc Af Amer: >60 mL/min (ref >60)
GFR calc non Af Amer: >60 mL/min (ref >60)
Glucose, Bld: 134 mg/dL — ABNORMAL HIGH (ref 70–99)
Phosphorus: 3.4 mg/dL (ref 2.5–4.6)
Potassium: 4.4 mmol/L (ref 3.5–5.1)
Sodium: 139 mmol/L (ref 135–145)

## 2020-04-29 LAB — CBC
HCT: 29 % — ABNORMAL LOW (ref 36.0–46.0)
Hemoglobin: 8.5 g/dL — ABNORMAL LOW (ref 12.0–15.0)
MCH: 22.3 pg — ABNORMAL LOW (ref 26.0–34.0)
MCHC: 29.3 g/dL — ABNORMAL LOW (ref 30.0–36.0)
MCV: 76.1 fL — ABNORMAL LOW (ref 80.0–100.0)
Platelets: 374 10*3/uL (ref 150–400)
RBC: 3.81 MIL/uL — ABNORMAL LOW (ref 3.87–5.11)
RDW: 16.8 % — ABNORMAL HIGH (ref 11.5–15.5)
WBC: 4.8 10*3/uL (ref 4.0–10.5)
nRBC: 0 % (ref 0.0–0.2)

## 2020-04-29 LAB — RETICULOCYTES
Immature Retic Fract: 19.5 % — ABNORMAL HIGH (ref 2.3–15.9)
RBC.: 3.77 MIL/uL — ABNORMAL LOW (ref 3.87–5.11)
Retic Count, Absolute: 54.3 10*3/uL (ref 19.0–186.0)
Retic Ct Pct: 1.4 % (ref 0.4–3.1)

## 2020-04-29 LAB — VITAMIN B12: Vitamin B-12: 295 pg/mL (ref 180–914)

## 2020-04-29 LAB — IRON AND TIBC
Iron: 24 ug/dL — ABNORMAL LOW (ref 28–170)
Saturation Ratios: 8 % — ABNORMAL LOW (ref 10.4–31.8)
TIBC: 283 ug/dL (ref 250–450)
UIBC: 259 ug/dL

## 2020-04-29 LAB — MAGNESIUM: Magnesium: 1.9 mg/dL (ref 1.7–2.4)

## 2020-04-29 LAB — FOLATE: Folate: 26.8 ng/mL (ref >5.9)

## 2020-04-29 LAB — GLUCOSE, CAPILLARY
Glucose-Capillary: 123 mg/dL — ABNORMAL HIGH (ref 70–99)
Glucose-Capillary: 174 mg/dL — ABNORMAL HIGH (ref 70–99)

## 2020-04-29 LAB — FERRITIN: Ferritin: 74 ng/mL (ref 11–307)

## 2020-04-29 MED ORDER — FLUCONAZOLE 100 MG PO TABS: 100.0000 mg | ORAL_TABLET | Freq: Every day | ORAL | Status: DC

## 2020-04-29 MED ORDER — FLUCONAZOLE 100 MG PO TABS
100.0000 mg | ORAL_TABLET | Freq: Every day | ORAL | 0 refills | Status: DC
Start: 2020-04-29 — End: 2020-11-04

## 2020-04-29 MED ORDER — METOPROLOL TARTRATE 25 MG PO TABS
12.5000 mg | ORAL_TABLET | Freq: Two times a day (BID) | ORAL | 0 refills | Status: DC
Start: 2020-04-29 — End: 2020-08-24

## 2020-04-29 NOTE — Discharge Summary (Signed)
Physician Discharge Summary  [name] OXB:[id number] DOB: 1956/01/20 DOA: 04/26/2020  PCP: Reynold Bowen, MD  Admit date: 04/26/2020 Discharge date: 04/29/2020  Admitted From: Home Disposition:  HOme  Recommendations for Outpatient Follow-up:  1. Follow up with PCP in 1-2 weeks 2. Please obtain BMP/CBC in one week   Discharge Condition:stable.  CODE STATUS: full code.  Diet recommendation: Heart Healthy   Brief/Interim Summary: 64 year old female with history of HTN, HLD, IDDM-2, asthma, hypothyroidism and gout presenting with skin rash and mouth ulcer.  She was started on allopurinol about 2 months ago.  She presented to PCP on 9/13 with an ulcer on her lower lip and started on p.o. Augmentin.  She returned to PCP office on 9/15 due to worsening and started on Cipro and Valtrex.  She also received a steroid injection.  Augmentin was discontinued.  She was referred to ENT.  She was seen by ENT the same day, and clindamycin and antibiotic eye ointments were added.  Patient broke out in rash on her chest and back on 9/16.  She was advised to stop Cipro but continue clindamycin, Valtrex and eyedrops.  [place] rash gotten worse and she went to ED on 9/17 and sent to ENT.  She stopped clindamycin on 9/18 and took her leftover oral Diflucan that helped a lot.  Eventually, she came to ED as she because the rash was not getting better and she was weak as she was not able to eat.  Patient denies history of cancer.  In ED, afebrile.  Hemodynamically stable.  97% on RA.  WBC 4.2. Hgb 9.1.  Differential normal without eosinophilia.  Cr 1.84.  BUN 44.  CRP 16.3.  ESR 115.  COVID-19 PCR negative.  Started on IV Diflucan, and admitted for stomatitis and skin rash and acute on chronic kidney disease.  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   Oral ulcer and skin rash improving. AKI/azotemia resolved.  Overall, she feels better.     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  Recent Cipro and Augmentin could contribute.  Denies history of cancer.  No new respiratory issues.  AKI and azotemia resolved.  Stomatitis and the skin lesion improving. SCORTEN score 2. -Discontinued allopurinol and added to her allergy list. Benadryl 12.5 mg every 8 hours as needed for itching -Avoid steroids.  No indication for antibiotics. -Follow-up HSV 1/2 IgM.  HIV nonreactive. -Change IV to p.o. Diflucan although I doubt utility but she thinks it is helping her stomatitis. Advanced diet and as tolerated.    AKI/azotemia: Resolved.  Maybe she does not have CKD-3A: Cr  1.84 (admit)> 1.31> 0.99.  AKI could be due to poor p.o. intake in the setting of stomatitis, TEN and/or NSAIDs. -Stop home NSAIDs  Uncontrolled IDDM-2 with hypoglycemia: A1c 7.1%. CBG (last 3)  Recent Labs    04/28/20 2332 04/29/20 0343 04/29/20 0731  GLUCAP 123* 123* 174*   Better controlled. Resume home meds on discharge.   Essential hypertension: Normotensive off home Maxzide, metoprolol, Cardizem -Resume home metoprolol  at lower dose for now. Hold all of the rest , and restart slowly as outpatient as per the PCP instructions.   Anemia of renal disease: Slight drop in H&H likely dilutional from IV fluid.  She was dehydrated on arrival.   History of asthma-likely moderate persistent.  On monthly mepolizumab injection and LABA/LAMA/ICS.  Stable.   Dehydration due to poor p.o. intake -improved.   History of gout-no active flareup -Discontinued allopurinol  and added to allergy list -Hold colchicine for now  Chronic pain syndrome: Stable. -Continue Lyrica   Conjunctivitis: Likely due to #1.  No evidence of bacterial infection.  Improved. -eyedrops  Hypothyroidism -Resume home Synthroid.    Discharge Instructions  Discharge Instructions    Diet - low sodium heart healthy   Complete by: As directed    Discharge instructions   Complete by: As directed    Please follow up with PCP/ Dr Forde Dandy  by the end of the week     Allergies as of 04/29/2020      Reactions   Allopurinol Rash   Toxic epidermal necrolysis   Codeine Other (See Comments)   REACTION: hallucinations/"loopy"   Levaquin [levofloxacin] Nausea And Vomiting   Sulfa Antibiotics Hives   Sulfonamide Derivatives Hives      Medication List    STOP taking these medications   aspirin 81 MG chewable tablet   colchicine 0.6 MG tablet   diclofenac 75 MG EC tablet Commonly known as: VOLTAREN   diclofenac Sodium 1 % Gel Commonly known as: VOLTAREN   furosemide 40 MG tablet Commonly known as: LASIX   isosorbide mononitrate 60 MG 24 hr tablet Commonly known as: IMDUR   mepolizumab 100 MG/ML Sosy Commonly known as: NUCALA   metoprolol succinate 25 MG 24 hr tablet Commonly known as: TOPROL-XL   NovoTwist 32G X 5 MM Misc Generic drug: Insulin Pen Needle   nystatin cream Commonly known as: MYCOSTATIN   omega-3 acid ethyl esters 1 g capsule Commonly known as: LOVAZA   rosuvastatin 5 MG tablet Commonly known as: CRESTOR   Systane Nighttime Oint   tobramycin-dexamethasone ophthalmic solution Commonly known as: TOBRADEX   traMADol 50 MG tablet Commonly known as: ULTRAM   triamcinolone cream 0.1 % Commonly known as: KENALOG   triamterene-hydrochlorothiazide 37.5-25 MG tablet Commonly known as: MAXZIDE-25   verapamil 120 MG CR tablet Commonly known as: CALAN-SR     TAKE these medications   albuterol 108 (90 Base) MCG/ACT inhaler Commonly known  as: Proventil HFA Inhale 2 puffs into the lungs every 6 (six) hours as needed for wheezing or shortness of breath. For shortness of breath and wheezing   diazepam 5 MG tablet Commonly known as: VALIUM Take 5 mg by mouth daily as needed (cramps).   fluconazole 100 MG tablet Commonly known as: DIFLUCAN Take 1 tablet (100 mg total) by mouth at bedtime.   fluticasone 50 MCG/ACT nasal spray Commonly known as: Flonase Place 2 sprays into both nostrils daily. What changed:   when to take this  reasons to take this   Flutter Devi 1 puff by Does not apply route daily.   HumaLOG KwikPen 100 UNIT/ML KwikPen Generic drug: insulin lispro Inject 5-10 Units into the skin See admin instructions. Inject 5 units subcutaneously up to three times daily if CBG >200, inject 10 units up to three times daily if CBG >300   insulin glargine 100 UNIT/ML Solostar Pen Commonly known as: LANTUS Inject 40 Units into the skin at bedtime.  ipratropium-albuterol 0.5-2.5 (3) MG/3ML Soln Commonly known as: DUONEB Take 3 mLs by nebulization every 4 (four) hours as needed. What changed: reasons to take this   levothyroxine 100 MCG tablet Commonly known as: SYNTHROID Take 100 mcg by mouth daily before breakfast.   lidocaine 2 % solution Commonly known as: XYLOCAINE Use as directed 15 mLs in the mouth or throat every 4 (four) hours as needed for mouth pain. What changed: additional instructions   liraglutide 18 MG/3ML Sopn Commonly known as: VICTOZA Inject 1.8 mg into the skin at bedtime.   metoprolol tartrate 25 MG tablet Commonly known as: LOPRESSOR Take 0.5 tablets (12.5 mg total) by mouth 2 (two) times daily.   montelukast 10 MG tablet Commonly known as: SINGULAIR Take 1 tablet (10 mg total) by mouth at bedtime. What changed:   when to take this  reasons to take this   nystatin 100000 UNIT/ML suspension Commonly known as: MYCOSTATIN Take 5 mLs by mouth 4 (four) times daily.   ONE TOUCH  ULTRA TEST test strip Generic drug: glucose blood Use as directed   pregabalin 100 MG capsule Commonly known as: LYRICA Take 100 mg by mouth 2 (two) times daily.   Theratears 0.25 % Soln Generic drug: Carboxymethylcellulose Sodium Place 1 drop into both eyes 3 (three) times daily.   Trelegy Ellipta 200-62.5-25 MCG/INH Aepb Generic drug: Fluticasone-Umeclidin-Vilant Inhale 1 puff into the lungs daily.       Allergies  Allergen Reactions  . Allopurinol Rash    Toxic epidermal necrolysis   . Codeine Other (See Comments)    REACTION: hallucinations/"loopy"  . Levaquin [Levofloxacin] Nausea And Vomiting  . Sulfa Antibiotics Hives  . Sulfonamide Derivatives Hives    Consultations: None.   Procedures/Studies:  No results found. )    Subjective: Wants to go home, no new complaints, able to tolerate food.   Discharge Exam: Vitals:   04/28/20 2031 04/29/20 0514  BP: (!) 153/85 (!) 133/59  Pulse: 98 (!) 58  Resp: 18 16  Temp: 99.2 F (37.3 C) 98.2 F (36.8 C)  SpO2: 96% 97%   Vitals:   04/28/20 0553 04/28/20 1342 04/28/20 2031 04/29/20 0514  BP:  (!) 141/64 (!) 153/85 (!) 133/59  Pulse:  63 98 (!) 58  Resp:  _0 Temp:  98.4 F (36.9 C) 99.2 F (37.3 C) 98.2 F (36.8 C)  TempSrc:  Oral    SpO2:  96% 96% 97%  Weight: 83.7 kg     Height:        General: Pt is alert, awake, not in acute distress Cardiovascular: RRR, S1/S2 +, no rubs, no gallops Respiratory: CTA bilaterally, no wheezing, no rhonchi Abdominal: Soft, NT, ND, bowel sounds + Extremities: no edema, no cyanosis    The results of significant diagnostics from this hospitalization (including imaging, microbiology, ancillary and laboratory) are listed below for reference.     Microbiology: Recent Results (from the past 240 hour(s))  SARS Coronavirus 2 by RT PCR (hospital order, performed in [facility] hospital lab) Nasopharyngeal Nasopharyngeal Swab     Status: None   Collection Time:  04/26/20  3:10 PM   Specimen: Nasopharyngeal Swab  Result Value Ref Range Status   SARS Coronavirus 2 NEGATIVE NEGATIVE Final    Comment: (NOTE) SARS-CoV-2 target nucleic acids are NOT DETECTED.  The SARS-CoV-2 RNA is generally detectable in upper and lower respiratory specimens during the acute phase of infection. The lowest concentration of SARS-CoV-2 viral copies this assay can  detect is 250 copies / mL. A negative result does not preclude SARS-CoV-2 infection and should not be used as the sole basis for treatment or other patient management decisions.  A negative result may occur with improper specimen collection / handling, submission of specimen other than nasopharyngeal swab, presence of viral mutation(s) within the areas targeted by this assay, and inadequate number of viral copies (<250 copies / mL). A negative result must be combined with clinical observations, patient history, and epidemiological information.  Fact Sheet for Patients:   StrictlyIdeas.no  Fact Sheet for Healthcare Providers: BankingDealers.co.za  This test is not yet approved or  cleared by the [place] FDA and has been authorized for detection and/or diagnosis of SARS-CoV-2 by FDA under an Emergency Use Authorization (EUA).  This EUA will remain in effect (meaning this test can be used) for the duration of the COVID-19 declaration under Section 564(b)(1) of the Act, 21 U.S.C. section 360bbb-3(b)(1), unless the authorization is terminated or revoked sooner.  Performed at [facility], [address] [place], [name] [address]      Labs: BNP (last 3 results) Recent Labs    09/05/19 1701  BNP 11   Basic Metabolic Panel: Recent Labs  Lab 04/26/20 1240 04/27/20 0350 04/28/20 0349 04/29/20 0333  NA 136 138 138 139  K 4.8 4.6 4.4 4.4  CL 100 105 107 109  CO2 24 21* 21* 20*  GLUCOSE 141* 88 138* 134*  BUN 44* 33* 20 15   CREATININE 1.84* 1.31* 0.99 0.97  CALCIUM 8.8* 8.8* 8.7* 8.8*  MG  --   --  2.0 1.9  PHOS  --   --  2.9 3.4   Liver Function Tests: Recent Labs  Lab 04/26/20 1240 04/28/20 0349 04/29/20 0333  AST 19  --   --   ALT 12  --   --   ALKPHOS 77  --   --   BILITOT 0.4  --   --   PROT 8.6*  --   --   ALBUMIN 3.2* 2.9* 2.9*   No results for input(s): LIPASE, AMYLASE in the last 168 hours. No results for input(s): AMMONIA in the last 168 hours. CBC: Recent Labs  Lab 04/26/20 1240 04/27/20 0350 04/28/20 0349 04/29/20 0333  WBC 4.2 4.8 4.8 4.8  NEUTROABS 2.5  --   --   --   HGB 9.1* 10.3* 8.6* 8.5*  HCT 30.4* 34.9* 28.8* 29.0*  MCV 75.2* 76.9* 75.8* 76.1*  PLT 420* 391 375 374   Cardiac Enzymes: No results for input(s): CKTOTAL, CKMB, CKMBINDEX, TROPONINI in the last 168 hours. BNP: Invalid input(s): POCBNP CBG: Recent Labs  Lab 04/28/20 1601 04/28/20 1935 04/28/20 2332 04/29/20 0343 04/29/20 0731  GLUCAP 120* 153* 123* 123* 174*   D-Dimer No results for input(s): DDIMER in the last 72 hours. Hgb A1c Recent Labs    04/27/20 0350  HGBA1C 7.1*   Lipid Profile No results for input(s): CHOL, HDL, LDLCALC, TRIG, CHOLHDL, LDLDIRECT in the last 72 hours. Thyroid function studies No results for input(s): TSH, T4TOTAL, T3FREE, THYROIDAB in the last 72 hours.  Invalid input(s): FREET3 Anemia work up Recent Labs    04/29/20 0333  VITAMINB12 295  FOLATE 26.8  FERRITIN 74  TIBC 283  IRON 24*  RETICCTPCT 1.4   Urinalysis    Component Value Date/Time   COLORURINE YELLOW 04/27/2020 1335   APPEARANCEUR CLEAR 04/27/2020 1335   LABSPEC 1.016 04/27/2020 1335   PHURINE 6.0  04/27/2020 1335   GLUCOSEU 50 (A) 04/27/2020 1335   HGBUR NEGATIVE 04/27/2020 1335   BILIRUBINUR NEGATIVE 04/27/2020 1335   KETONESUR 5 (A) 04/27/2020 1335   PROTEINUR NEGATIVE 04/27/2020 1335   NITRITE NEGATIVE 04/27/2020 1335   LEUKOCYTESUR NEGATIVE 04/27/2020 1335   Sepsis Labs Invalid  input(s): PROCALCITONIN,  WBC,  LACTICIDVEN Microbiology Recent Results (from the past 240 hour(s))  SARS Coronavirus 2 by RT PCR (hospital order, performed in [place] hospital lab) Nasopharyngeal Nasopharyngeal Swab     Status: None   Collection Time: 04/26/20  3:10 PM   Specimen: Nasopharyngeal Swab  Result Value Ref Range Status   SARS Coronavirus 2 NEGATIVE NEGATIVE Final    Comment: (NOTE) SARS-CoV-2 target nucleic acids are NOT DETECTED.  The SARS-CoV-2 RNA is generally detectable in upper and lower respiratory specimens during the acute phase of infection. The lowest concentration of SARS-CoV-2 viral copies this assay can detect is 250 copies / mL. A negative result does not preclude SARS-CoV-2 infection and should not be used as the sole basis for treatment or other patient management decisions.  A negative result may occur with improper specimen collection / handling, submission of specimen other than nasopharyngeal swab, presence of viral mutation(s) within the areas targeted by this assay, and inadequate number of viral copies (<250 copies / mL). A negative result must be combined with clinical observations, patient history, and epidemiological information.  Fact Sheet for Patients:   StrictlyIdeas.no  Fact Sheet for Healthcare Providers: BankingDealers.co.za  This test is not yet approved or  cleared by the [place] FDA and has been authorized for detection and/or diagnosis of SARS-CoV-2 by FDA under an Emergency Use Authorization (EUA).  This EUA will remain in effect (meaning this test can be used) for the duration of the COVID-19 declaration under Section 564(b)(1) of the Act, 21 U.S.C. section 360bbb-3(b)(1), unless the authorization is terminated or revoked sooner.  Performed at [facility], [address] [place], [place] [address]      Time coordinating discharge: 32 minutes.    SIGNED:   Hosie Poisson, MD  [facility] 04/29/2020, 12:47 PM

## 2020-04-29 NOTE — Progress Notes (Signed)
Discharge instructions discussed with patient, verbalized agreement and understanding 

## 2020-05-01 ENCOUNTER — Ambulatory Visit: Payer: 59 | Admitting: Physical Therapy

## 2020-05-04 ENCOUNTER — Telehealth: Payer: Self-pay | Admitting: Critical Care Medicine

## 2020-05-04 ENCOUNTER — Ambulatory Visit: Payer: 59

## 2020-05-04 NOTE — Telephone Encounter (Signed)
Nucala Order: 100mg  #1 Vial Order Date: 05/04/20 Expected date of arrival: 05/06/20 Ordered by: [place]: [name]

## 2020-05-07 NOTE — Telephone Encounter (Signed)
Nucala Shipment Received: 100mg  #1 vial Medication arrival date: 05/07/20 Lot #: [id number] Exp date: 09/08/2023 Received by: Elliot Dally

## 2020-05-15 ENCOUNTER — Telehealth: Payer: Self-pay | Admitting: Critical Care Medicine

## 2020-05-15 NOTE — Telephone Encounter (Signed)
Dr Carlis Abbott going to be covering only hospital soon- OK to go ahead and schedule 30 min visit with another provider  LMTCB x 1

## 2020-05-18 NOTE — Telephone Encounter (Signed)
ATC pt, call went straight to VM. LMTCB x1 for pt.

## 2020-05-19 NOTE — Telephone Encounter (Signed)
Called and left detailed message for pt that she can make an appt with a new provider when she comes in for her injection on 05/21/20. Advised her to call back sooner if she needs. Will sign off.

## 2020-05-20 ENCOUNTER — Ambulatory Visit: Payer: 59 | Admitting: Critical Care Medicine

## 2020-05-21 ENCOUNTER — Other Ambulatory Visit: Payer: Self-pay

## 2020-05-21 ENCOUNTER — Ambulatory Visit (INDEPENDENT_AMBULATORY_CARE_PROVIDER_SITE_OTHER): Payer: 59

## 2020-05-21 DIAGNOSIS — J4550 Severe persistent asthma, uncomplicated: Secondary | ICD-10-CM

## 2020-05-21 MED ORDER — MEPOLIZUMAB 100 MG ~~LOC~~ SOLR
100.0000 mg | SUBCUTANEOUS | Status: DC
  Administered 2020-05-21: 100 mg via SUBCUTANEOUS

## 2020-05-21 NOTE — Progress Notes (Signed)
Have you been hospitalized within the last 10 days?  No Do you have a fever?  No Do you have a cough?  No Do you have a headache or sore throat? No Do you have your Epi Pen visible and is it within date?  Yes 

## 2020-05-22 ENCOUNTER — Encounter (HOSPITAL_BASED_OUTPATIENT_CLINIC_OR_DEPARTMENT_OTHER): Payer: 59 | Admitting: Internal Medicine

## 2020-06-08 ENCOUNTER — Telehealth: Payer: Self-pay | Admitting: Critical Care Medicine

## 2020-06-08 NOTE — Telephone Encounter (Signed)
Nucala Order: 100mg  #1 Vial Order Date: 06/08/20 Expected date of arrival: 06/09/20 Ordered by: [place]: [name]

## 2020-06-09 NOTE — Telephone Encounter (Signed)
Nucala Shipment Received: 100mg  #1 vial Medication arrival date: 06/09/20 Lot #: [id number] Exp date: 10/06/2023 Received by: Elliot Dally

## 2020-06-11 ENCOUNTER — Ambulatory Visit (INDEPENDENT_AMBULATORY_CARE_PROVIDER_SITE_OTHER): Payer: 59 | Admitting: Pulmonary Disease

## 2020-06-11 ENCOUNTER — Encounter: Payer: Self-pay | Admitting: Pulmonary Disease

## 2020-06-11 ENCOUNTER — Other Ambulatory Visit: Payer: Self-pay

## 2020-06-11 VITALS — BP 126/64 | HR 72 | Temp 98.3°F | Ht 62.0 in | Wt 181.6 lb

## 2020-06-11 DIAGNOSIS — J4550 Severe persistent asthma, uncomplicated: Secondary | ICD-10-CM | POA: Diagnosis not present

## 2020-06-11 DIAGNOSIS — J8283 Eosinophilic asthma: Secondary | ICD-10-CM

## 2020-06-11 NOTE — Patient Instructions (Signed)
Severe persistent asthma on Nucala  -Continue schedule with Nucala -Continue Trelegy -Avoid known triggers  Recent illness with Celene Kras syndrome -Will take time before things settle down  Will not escalate any treatment at present -If you don't continue to feel better then we will get a chest x-ray, breathing study  Call with significant concerns  Follow-up in 3 months

## 2020-06-11 NOTE — Progress Notes (Signed)
Synopsis: Referred in January 2021 for SOB by Reynold Bowen, MD.   Subjective:   PATIENT ID: [name] GENDER: female DOB: 1956/03/10, MRN: [id number]  Patient with a history of severe persistent asthma Was started on Nucala Was recently sick with Stevens-Johnson syndrome, hospitalized for few days Symptoms currently improving  Does have an occasional cough, minimal phlegm production  She does need to use albuterol Has not needed her nebulizer recently  Denies any pain or discomfort  She is currently on Trelegy Felt Breo worked better in the past  She has a history of allergic rhinosinusitis, sinus drainage 2 weeks Flonase, Singulair  Not feeling acutely ill at present  Past Medical History:  Diagnosis Date  . Acute bronchitis   . Anemia    hx of anemia  . Anginal pain ([place])   . Arthritis    osteo  . Asthma   . Coronary atherosclerosis   . Cough   . GERD (gastroesophageal reflux disease)   . History of colon polyps   . History of migraine   . Hyperlipidemia   . Hypertension   . Obstructive sleep apnea (adult) (pediatric)    NO CPAP   . Peripheral neuropathy   . Type II or unspecified type diabetes mellitus without mention of complication, not stated as uncontrolled    type 2  . Unspecified hypothyroidism      Family History  Problem Relation Age of Onset  . Hypertension Mother   . Heart disease Mother   . Stroke Mother   . Prostate cancer Father   . Heart disease Brother   . Heart attack Sister      Past Surgical History:  Procedure Laterality Date  . ANTERIOR FUSION CERVICAL SPINE    . CARPAL TUNNEL RELEASE Left   . COLONOSCOPY    . CORONARY ANGIOPLASTY    . LEFT HEART CATHETERIZATION WITH CORONARY ANGIOGRAM N/A 05/29/2014   Procedure: LEFT HEART CATHETERIZATION WITH CORONARY ANGIOGRAM;  Surgeon: Sinclair Grooms, MD;  Location: [facility] CATH LAB;  Service: Cardiovascular;  Laterality: N/A;  . 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    Procedure: TOTAL KNEE ARTHROPLASTY;  Surgeon: Susa Day, MD;  Location: [facility] ORS;  Service: Orthopedics;  Laterality: Left;  3 hrs  . TUBAL LIGATION    . WISDOM TOOTH EXTRACTION      Social History   Socioeconomic History  . Marital status: Widowed    Spouse name: Not on file  . Number of children: 0  . Years of education: Not on file  . Highest education level: Not on file  Occupational History  . Occupation: retired  Tobacco Use  . Smoking status: Former Smoker    Packs/day: 0.50    Years: 25.00    Pack years: 12.50    Quit date: 08/09/2003    Years since quitting: 16.8  . Smokeless tobacco: Never Used  Vaping Use  . Vaping Use: Never used  Substance and Sexual Activity  . Alcohol use: No    Alcohol/week: 0.0 standard drinks  . Drug use: No  . Sexual activity: Not on file  Other Topics Concern  . Not on file  Social History Narrative  . Not on file   Social Determinants of Health   Financial Resource Strain:   . Difficulty of Paying Living Expenses: Not on file  Food Insecurity:   . Worried About Charity fundraiser in the Last Year: Not on file  . Ran Out of Food in  the Last Year: Not on file  Transportation Needs:   . Lack of Transportation (Medical): Not on file  . Lack of Transportation (Non-Medical): Not on file  Physical Activity:   . Days of Exercise per Week: Not on file  . Minutes of Exercise per Session: Not on file  Stress:   . Feeling of Stress : Not on file  Social Connections:   . Frequency of Communication with Friends and Family: Not on file  . Frequency of Social Gatherings with Friends and Family: Not on file  . Attends Religious Services: Not on file  . Active Member of Clubs or Organizations: Not on file  . Attends Archivist Meetings: Not on file  . Marital Status: Not on file  Intimate Partner Violence:   . Fear of Current or Ex-Partner: Not on file  . Emotionally Abused: Not on file  . Physically Abused: Not on file  .  Sexually Abused: Not on file     Allergies  Allergen Reactions  . Allopurinol Rash    Toxic epidermal necrolysis   . Codeine Other (See Comments)    REACTION: hallucinations/"loopy"  . Levaquin [Levofloxacin] Nausea And Vomiting  . Sulfa Antibiotics Hives  . Sulfonamide Derivatives Hives     Immunization History  Administered Date(s) Administered  . Fluad Quad(high Dose 65+) 06/05/2019  . Influenza Split 05/10/2011, 05/09/2012, 05/31/2013  . Influenza Whole 05/08/2010, 05/26/2020  . Influenza,inj,Quad PF,6+ Mos 09/14/2015  . Influenza-Unspecified 06/04/2014  . PFIZER SARS-COV-2 Vaccination 10/03/2019, 10/25/2019  . Pneumococcal Polysaccharide-23 08/09/2007    Outpatient Medications Prior to Visit  Medication Sig Dispense Refill  . albuterol (PROVENTIL HFA) 108 (90 BASE) MCG/ACT inhaler Inhale 2 puffs into the lungs every 6 (six) hours as needed for wheezing or shortness of breath. For shortness of breath and wheezing 1 Inhaler prn  . Carboxymethylcellulose Sodium (THERATEARS) 0.25 % SOLN Place 1 drop into both eyes 3 (three) times daily.     . diazepam (VALIUM) 5 MG tablet Take 5 mg by mouth daily as needed (cramps).     . fluconazole (DIFLUCAN) 100 MG tablet Take 1 tablet (100 mg total) by mouth at bedtime. 5 tablet 0  . fluticasone (FLONASE) 50 MCG/ACT nasal spray Place 2 sprays into both nostrils daily. (Patient taking differently: Place 2 sprays into both nostrils daily as needed for allergies. ) 18.2 mL 11  . Fluticasone-Umeclidin-Vilant (TRELEGY ELLIPTA) 200-62.5-25 MCG/INH AEPB Inhale 1 puff into the lungs daily. 1 each 11  . furosemide (LASIX) 40 MG tablet Take 40 mg by mouth 2 (two) times daily.    . insulin glargine (LANTUS) 100 UNIT/ML Solostar Pen Inject 40 Units into the skin at bedtime.     . insulin lispro (HUMALOG KWIKPEN) 100 UNIT/ML KwikPen Inject 5-10 Units into the skin See admin instructions. Inject 5 units subcutaneously up to three times daily if CBG >200,  inject 10 units up to three times daily if CBG >300    . ipratropium-albuterol (DUONEB) 0.5-2.5 (3) MG/3ML SOLN Take 3 mLs by nebulization every 4 (four) hours as needed. (Patient taking differently: Take 3 mLs by nebulization every 4 (four) hours as needed (wheezing/shortness of breath.). ) 360 mL 11  . isosorbide mononitrate (IMDUR) 60 MG 24 hr tablet Take 60 mg by mouth daily.    Marland Kitchen levothyroxine (SYNTHROID) 100 MCG tablet Take 100 mcg by mouth daily before breakfast.     . lidocaine (XYLOCAINE) 2 % solution Use as directed 15 mLs in the mouth or  throat every 4 (four) hours as needed for mouth pain. (Patient taking differently: Use as directed 15 mLs in the mouth or throat every 4 (four) hours as needed for mouth pain. Swish and swalloe) 100 mL 0  . liraglutide (VICTOZA) 18 MG/3ML SOPN Inject 1.8 mg into the skin at bedtime.    . metoprolol tartrate (LOPRESSOR) 25 MG tablet Take 0.5 tablets (12.5 mg total) by mouth 2 (two) times daily. 60 tablet 0  . montelukast (SINGULAIR) 10 MG tablet Take 1 tablet (10 mg total) by mouth at bedtime. (Patient taking differently: Take 10 mg by mouth at bedtime as needed (seasonal allergies). ) 30 tablet 11  . nystatin (MYCOSTATIN) 100000 UNIT/ML suspension Take 5 mLs by mouth 4 (four) times daily.    . ONE TOUCH ULTRA TEST test strip Use as directed    . pregabalin (LYRICA) 100 MG capsule Take 100 mg by mouth 2 (two) times daily.     Marland Kitchen Respiratory Therapy Supplies (FLUTTER) DEVI 1 puff by Does not apply route daily. 1 each 0  . rosuvastatin (CRESTOR) 5 MG tablet Take 5 mg by mouth daily.    . valACYclovir (VALTREX) 1000 MG tablet Take 4,000 mg by mouth once.    . verapamil (CALAN-SR) 120 MG CR tablet Take 120 mg by mouth daily.     Facility-Administered Medications Prior to Visit  Medication Dose Route Frequency Provider Last Rate Last Admin  . Mepolizumab SOLR 100 mg  100 mg Subcutaneous Q28 days Julian Hy, DO   100 mg at 05/21/20 1016    Review of  Systems  Constitutional: Negative for chills, fever and weight loss.  HENT: Negative for congestion.        Postnasal drip  Respiratory: Positive for cough, shortness of breath and wheezing.   Cardiovascular: Negative for chest pain and leg swelling.  Gastrointestinal: Negative for heartburn.  Endo/Heme/Allergies: Positive for environmental allergies.     Objective:   Vitals:   06/11/20 1031  BP: 126/64  Pulse: 72  Temp: 98.3 F (36.8 C)  TempSrc: Temporal  SpO2: 98%  Weight: 181 lb 9.6 oz (82.4 kg)  Height: 5\' 2"  (1.575 m)   98% on   RA BMI Readings from Last 3 Encounters:  06/11/20 33.22 kg/m  04/28/20 33.75 kg/m  04/24/20 33.95 kg/m   Wt Readings from Last 3 Encounters:  06/11/20 181 lb 9.6 oz (82.4 kg)  04/28/20 184 lb 8.4 oz (83.7 kg)  04/24/20 185 lb 10 oz (84.2 kg)    Physical Exam Vitals reviewed.  Constitutional:      Appearance: Normal appearance. She is not ill-appearing.  HENT:     Head: Normocephalic and atraumatic.  Eyes:     General: No scleral icterus. Cardiovascular:     Rate and Rhythm: Normal rate and regular rhythm.     Heart sounds: No murmur heard.   Pulmonary:     Effort: No respiratory distress.     Breath sounds: No stridor. No wheezing or rhonchi.  Abdominal:     General: There is no distension.  Musculoskeletal:     Cervical back: No rigidity or tenderness.  Lymphadenopathy:     Cervical: No cervical adenopathy.  Skin:    General: Skin is warm.  Neurological:     Mental Status: She is alert.     Motor: No weakness.     Coordination: Coordination normal.  Psychiatric:        Mood and Affect: Mood normal.  Behavior: Behavior normal.    CBC    Component Value Date/Time   WBC 4.8 04/29/2020 0333   RBC 3.77 (L) 04/29/2020 0333   RBC 3.81 (L) 04/29/2020 0333   HGB 8.5 (L) 04/29/2020 0333   HGB 10.9 (L) 03/28/2018 1031   HCT 29.0 (L) 04/29/2020 0333   HCT 35.4 03/28/2018 1031   PLT 374 04/29/2020 0333   PLT  312 03/28/2018 1031   MCV 76.1 (L) 04/29/2020 0333   MCV 77 (L) 03/28/2018 1031   MCH 22.3 (L) 04/29/2020 0333   MCHC 29.3 (L) 04/29/2020 0333   RDW 16.8 (H) 04/29/2020 0333   RDW 17.0 (H) 03/28/2018 1031   LYMPHSABS 0.7 04/26/2020 1240   MONOABS 0.9 04/26/2020 1240   EOSABS 0.1 04/26/2020 1240   BASOSABS 0.0 04/26/2020 1240    CHEMISTRY No results for input(s): NA, K, CL, CO2, GLUCOSE, BUN, CREATININE, CALCIUM, MG, PHOS in the last 168 hours. CrCl cannot be calculated (Patient's most recent lab result is older than the maximum 21 days allowed.).  09/05/2019 labs: IgE 419 Eosinophils 800  Chest Imaging- films reviewed: CXR, 2 view 12/08/2015- C-spine hardware, otherwise normal No recent chest x-ray available  Pulmonary Functions Testing Results: PFT Results Latest Ref Rng & Units 12/22/2015  FVC-Pre L 2.60  FVC-Predicted Pre % 106  FVC-Post L 2.48  FVC-Predicted Post % 102  Pre FEV1/FVC % % 72  Post FEV1/FCV % % 67  FEV1-Pre L 1.87  FEV1-Predicted Pre % 97  FEV1-Post L 1.67  DLCO uncorrected ml/min/mmHg 13.06  DLCO UNC% % 60  DLVA Predicted % 66  TLC L 4.76  TLC % Predicted % 100  RV % Predicted % 118   2017- FVC 3.0 (97%) 3.18 (102%, +6%)  FEV1  2.05 (87%) 2.14 (91%, +5%) Ratio 68 TLC 106% RV 119% DLCO 62%    Echocardiogram 04/23/2019: LVEF 60 to 65%, mild LVH.  Normal diastolic function.  Normal LA, RV, RA.  Mild AI, otherwise normal valves.     Assessment & Plan:   Severe persistent asthma with acute exacerbation; elevated IgE & eosinophils -Continue Trelegy -Continue bronchodilator treatments -Continue nasal steroids and antihistamines -Continue Nucala  Chronic rhinosinusitis -Continue to follow closely  History of GERD -Continue antireflux medications  Recent Stevens-Johnson syndrome -Breathing does not appear to be significantly impacted during the exacerbation -We will continue to monitor -Encouraged to call with any significant problems with  her breathing  Follow-up in 3 months

## 2020-06-18 ENCOUNTER — Ambulatory Visit (INDEPENDENT_AMBULATORY_CARE_PROVIDER_SITE_OTHER): Payer: 59

## 2020-06-18 ENCOUNTER — Other Ambulatory Visit: Payer: Self-pay

## 2020-06-18 DIAGNOSIS — J4550 Severe persistent asthma, uncomplicated: Secondary | ICD-10-CM | POA: Diagnosis not present

## 2020-06-18 MED ORDER — MEPOLIZUMAB 100 MG ~~LOC~~ SOLR
100.0000 mg | Freq: Once | SUBCUTANEOUS | Status: AC
  Administered 2020-06-18: 100 mg via SUBCUTANEOUS

## 2020-06-18 NOTE — Progress Notes (Signed)
Have you been hospitalized within the last 10 days?  No Do you have a fever?  No Do you have a cough?  No Do you have a headache or sore throat? No Do you have your Epi Pen visible and is it within date?  Yes 

## 2020-06-26 ENCOUNTER — Telehealth: Payer: Self-pay | Admitting: Pulmonary Disease

## 2020-06-26 NOTE — Telephone Encounter (Signed)
ATC patient to get more information LMTCB

## 2020-06-29 ENCOUNTER — Other Ambulatory Visit: Payer: Self-pay | Admitting: Pulmonary Disease

## 2020-06-29 MED ORDER — NYSTATIN 100000 UNIT/ML MT SUSP: 5.0000 mL | Freq: Four times a day (QID) | OROMUCOSAL | 1 refills | Status: AC

## 2020-06-29 MED ORDER — FLUCONAZOLE 100 MG PO TABS: ORAL_TABLET | ORAL | 0 refills | Status: DC

## 2020-06-29 NOTE — Telephone Encounter (Signed)
Called and spoke to pt. Informed her of the recs per AO. Rx sent to preferred pharmacy. .Pt verbalized understanding and denied any further questions or concerns at this time.

## 2020-06-29 NOTE — Telephone Encounter (Signed)
Please send in a prescription for Diflucan 200 mg p.o. day 1 and then 100 mg daily for 13 days

## 2020-06-29 NOTE — Telephone Encounter (Signed)
Called and spoke to pt. Informed her of the recs per Dr. Ander Slade. Pt then states nystatin doesn't work as well as diflucan. Pt is requesting diflucan.   Dr. Ander Slade please advise. Thank you!

## 2020-06-29 NOTE — Telephone Encounter (Signed)
Nystatin sent into pharmacy

## 2020-06-29 NOTE — Progress Notes (Signed)
Nystatin sent in 

## 2020-06-29 NOTE — Telephone Encounter (Signed)
Called and spoke to pt. Pt states she has thrush. Pt c/o white patches on oral mucosa, gum soreness, sore throat x 1 week. Pt states she has been washing her mouth out with Biotene after her trelegy. Pt is requesting recs today.   Dr. Ander Slade, please advise. Thanks.

## 2020-07-06 ENCOUNTER — Telehealth: Payer: Self-pay | Admitting: Pulmonary Disease

## 2020-07-06 NOTE — Telephone Encounter (Signed)
Nucala Order: 100mg  #1 Vial Order Date: 07/06/20 Expected date of arrival: 07/07/20 Ordered by: [place]: [name]

## 2020-07-09 NOTE — Telephone Encounter (Signed)
Nucala Shipment Received: 100mg  #1 vial Medication arrival date: 07/07/20 Lot #: [id number] Exp date: 11/06/2023 Received by: Elliot Dally

## 2020-07-17 ENCOUNTER — Ambulatory Visit (INDEPENDENT_AMBULATORY_CARE_PROVIDER_SITE_OTHER): Payer: 59

## 2020-07-17 ENCOUNTER — Other Ambulatory Visit: Payer: Self-pay

## 2020-07-17 DIAGNOSIS — J4550 Severe persistent asthma, uncomplicated: Secondary | ICD-10-CM | POA: Diagnosis not present

## 2020-07-17 MED ORDER — MEPOLIZUMAB 100 MG ~~LOC~~ SOLR
100.0000 mg | Freq: Once | SUBCUTANEOUS | Status: AC
  Administered 2020-07-17: 100 mg via SUBCUTANEOUS

## 2020-07-17 NOTE — Progress Notes (Signed)
Have you been hospitalized within the last 10 days?  No Do you have a fever?  No Do you have a cough?  No Do you have a headache or sore throat? No  

## 2020-07-27 ENCOUNTER — Telehealth: Payer: Self-pay | Admitting: Pulmonary Disease

## 2020-07-27 NOTE — Telephone Encounter (Signed)
Nucala Order: 100mg  #1 Vial Order Date: 07/27/20 Expected date of arrival: 07/28/20 Ordered by: [place]: [name]

## 2020-07-28 NOTE — Telephone Encounter (Signed)
Nucala Shipment Received: 100mg  #1 vial Medication arrival date: 07/28/20 Lot #: [id number] Exp date: 11/06/2023 Received by: Elliot Dally

## 2020-07-29 ENCOUNTER — Ambulatory Visit (INDEPENDENT_AMBULATORY_CARE_PROVIDER_SITE_OTHER): Payer: 59 | Admitting: Physician Assistant

## 2020-07-29 ENCOUNTER — Encounter: Payer: Self-pay | Admitting: Physician Assistant

## 2020-07-29 ENCOUNTER — Other Ambulatory Visit: Payer: Self-pay

## 2020-07-29 VITALS — BP 128/82 | HR 65 | Ht 62.0 in | Wt 183.0 lb

## 2020-07-29 DIAGNOSIS — R079 Chest pain, unspecified: Secondary | ICD-10-CM | POA: Diagnosis not present

## 2020-07-29 DIAGNOSIS — D649 Anemia, unspecified: Secondary | ICD-10-CM

## 2020-07-29 DIAGNOSIS — I25119 Atherosclerotic heart disease of native coronary artery with unspecified angina pectoris: Secondary | ICD-10-CM | POA: Diagnosis not present

## 2020-07-29 DIAGNOSIS — I10 Essential (primary) hypertension: Secondary | ICD-10-CM | POA: Diagnosis not present

## 2020-07-29 DIAGNOSIS — E119 Type 2 diabetes mellitus without complications: Secondary | ICD-10-CM

## 2020-07-29 DIAGNOSIS — E785 Hyperlipidemia, unspecified: Secondary | ICD-10-CM | POA: Diagnosis not present

## 2020-07-29 MED ORDER — ISOSORBIDE MONONITRATE ER 120 MG PO TB24
120.0000 mg | ORAL_TABLET | Freq: Every day | ORAL | 3 refills | Status: DC
Start: 2020-07-29 — End: 2021-02-01

## 2020-07-29 NOTE — Patient Instructions (Signed)
Medication Instructions:  INCREASE ISOSORBIDE 120MG  DAILY *If you need a refill on your cardiac medications before your next appointment, please call your pharmacy*  Lab Work:   Testing/Procedures:  NONE    NONE  Follow-Up: Your next appointment:  3 week(s) In Person with Shelva Majestic, MD OR IF UNAVAILABLE HAO MENG PA-C At [facility], you and your health needs are our priority.  As part of our continuing mission to provide you with exceptional heart care, we have created designated Provider Care Teams.  These Care Teams include your primary Cardiologist (physician) and Advanced Practice Providers (APPs -  Physician Assistants and Nurse Practitioners) who all work together to provide you with the care you need, when you need it.

## 2020-07-29 NOTE — Progress Notes (Signed)
Cardiology Office Note:    Date:  07/31/2020   ID:  Denise, [name] [birth year], MRN [id number]  PCP:  Denise Bowen, MD  [facility] Cardiologist:  Denise Majestic, MD  [place] Electrophysiologist:  None   Referring MD: Denise Bowen, MD   Chief Complaint  Patient presents with  . Follow-up    Seen for Dr. Claiborne [name]    History of Present Illness:    [name] is a 64 y.o. female with a hx of CAD, OSA on CPAP, hypertension, hyperlipidemia, DM 2, severe persistent asthma followed by Dr. Ander [name] and hypothyroidism.  Patient underwent DES to proximal RCA in 2007.  She also had concomitant 60 to 70% disease in LAD, 20% disease in left circumflex artery at the time.  Repeat cardiac catheterization in July 2010 showed 60% LAD disease, 20 to 30% left circumflex disease, widely patent RCA stent.  She had a high risk Myoview in 2015 and underwent repeat cardiac catheterization in October 2015 that showed stable anatomy that is unchanged when compared to the previous cath, 50% proximal to mid LAD stenosis with FFR of 0.94.  Echocardiogram in September 2020 showed normal systolic and diastolic function with mild LVH, moderate AI.  Patient was last seen by Dr. Claiborne [name] in December 2020 at which time she was doing well.  Patient presents today for follow-up.  She denies any recent shortness of breath.  She was admitted several months ago due to a rash that was concerning for possible Stevens-Johnson syndrome.  Looking at her labs, she is having worsening anemia and plan to see GI service on January 14.  She also mentions her thyroid was low and her primary care provider plan to increase her levothyroxine from the current 167mcg to 125 mcg and eventually to 150 mcg in the future.  She is also on triamterene-hydrochlorothiazide that is not on our medication list.  Blood pressure seems to be very well controlled.  She drives a schoolbus.  She is under a lot of stress during her work.  She takes  the Lasix as needed instead of daily.  She has been having intermittent chest discomfort.  We discussed various options.  She has a history of false positive nuclear stress test, therefore I did not wish to repeat a nuclear stress test at this time.  The only option left is either medical therapy versus cardiac catheterization.  However with her recent anemia, I am hesitant to proceed with cardiac catheterization which may require her to be on aspirin and Brilinta afterward which may worsen her anemia.  I recommend proceed with GI work-up.  I increased her Imdur to 120 mg daily.  I plan to see her back in 3 weeks for reassessment.  She is aware that if her chest pain is not going away she will need to go to the emergency room for further assessment.   Past Medical History:  Diagnosis Date  . Acute bronchitis   . Anemia    hx of anemia  . Anginal pain ([place])   . Arthritis    osteo  . Asthma   . Coronary atherosclerosis   . Cough   . GERD (gastroesophageal reflux disease)   . History of colon polyps   . History of migraine   . Hyperlipidemia   . Hypertension   . Obstructive sleep apnea (adult) (pediatric)    NO CPAP   . Peripheral neuropathy   . Type II or unspecified type diabetes mellitus without mention of  complication, not stated as uncontrolled    type 2  . Unspecified hypothyroidism     Past Surgical History:  Procedure Laterality Date  . ANTERIOR FUSION CERVICAL SPINE    . CARPAL TUNNEL RELEASE Left   . COLONOSCOPY    . CORONARY ANGIOPLASTY    . LEFT HEART CATHETERIZATION WITH CORONARY ANGIOGRAM N/A 05/29/2014   Procedure: LEFT HEART CATHETERIZATION WITH CORONARY ANGIOGRAM;  Surgeon: Denise Grooms, MD;  Location: [facility] CATH LAB;  Service: Cardiovascular;  Laterality: N/A;  . 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   Procedure: TOTAL KNEE ARTHROPLASTY;  Surgeon: Denise Day, MD;  Location: [facility] ORS;  Service: Orthopedics;  Laterality: Left;  3 hrs  . TUBAL LIGATION    .  WISDOM TOOTH EXTRACTION      Current Medications: Current Meds  Medication Sig  . albuterol (PROVENTIL HFA) 108 (90 BASE) MCG/ACT inhaler Inhale 2 puffs into the lungs every 6 (six) hours as needed for wheezing or shortness of breath. For shortness of breath and wheezing  . Carboxymethylcellulose Sodium (THERATEARS) 0.25 % SOLN Place 1 drop into both eyes 3 (three) times daily.  . diazepam (VALIUM) 5 MG tablet Take 5 mg by mouth daily as needed (cramps).   . diclofenac (VOLTAREN) 75 MG EC tablet Take 75 mg by mouth 2 (two) times daily.  . diclofenac Sodium (VOLTAREN) 1 % GEL Apply 1 application topically 4 (four) times daily.  . fluconazole (DIFLUCAN) 100 MG tablet Take 1 tablet (100 mg total) by mouth at bedtime.  . fluticasone (FLONASE) 50 MCG/ACT nasal spray Place 2 sprays into both nostrils daily. (Patient taking differently: Place 2 sprays into both nostrils daily as needed for allergies.)  . Fluticasone-Umeclidin-Vilant (TRELEGY ELLIPTA) 200-62.5-25 MCG/INH AEPB Inhale 1 puff into the lungs daily.  . furosemide (LASIX) 40 MG tablet Take 40 mg by mouth 2 (two) times daily.  . insulin glargine (LANTUS) 100 UNIT/ML Solostar Pen Inject 40 Units into the skin at bedtime.   . insulin lispro (HUMALOG) 100 UNIT/ML KwikPen Inject 5-10 Units into the skin See admin instructions. Inject 5 units subcutaneously up to three times daily if CBG >200, inject 10 units up to three times daily if CBG >300  . ipratropium (ATROVENT) 0.02 % nebulizer solution Inhale into the lungs.  [name] ipratropium-albuterol (DUONEB) 0.5-2.5 (3) MG/3ML SOLN Take 3 mLs by nebulization every 4 (four) hours as needed. (Patient taking differently: Take 3 mLs by nebulization every 4 (four) hours as needed (wheezing/shortness of breath.).)  . JARDIANCE 10 MG TABS tablet Take 10 mg by mouth daily.  . Lancets (ONETOUCH DELICA PLUS [id number]) MISC Apply 1 each topically 4 (four) times daily.  [name] levothyroxine (SYNTHROID) 100 MCG tablet Take  100 mcg by mouth daily before breakfast.   . lidocaine (XYLOCAINE) 2 % solution Use as directed 15 mLs in the mouth or throat every 4 (four) hours as needed for mouth pain. (Patient taking differently: Use as directed 15 mLs in the mouth or throat every 4 (four) hours as needed for mouth pain. Swish and swalloe)  . liraglutide (VICTOZA) 18 MG/3ML SOPN Inject 1.8 mg into the skin at bedtime.  [name] loratadine (CLARITIN) 10 MG tablet loratadine 10 mg tablet  . metoprolol succinate (TOPROL-XL) 25 MG 24 hr tablet Take by mouth.  . metoprolol tartrate (LOPRESSOR) 25 MG tablet Take 0.5 tablets (12.5 mg total) by mouth 2 (two) times daily.  . montelukast (SINGULAIR) 10 MG tablet Take 1 tablet (10 mg total) by mouth at  bedtime. (Patient taking differently: Take 10 mg by mouth at bedtime as needed (seasonal allergies).)  . NOVOFINE PLUS PEN NEEDLE 32G X 4 MM MISC Inject into the skin.  [name] nystatin (MYCOSTATIN) 100000 UNIT/ML suspension Take 5 mLs by mouth 4 (four) times daily.  [name] nystatin cream (MYCOSTATIN)   . ONE TOUCH ULTRA TEST test strip Use as directed  . potassium chloride SA (KLOR-CON) 20 MEQ tablet potassium chloride ER 20 mEq tablet,extended release  . pregabalin (LYRICA) 100 MG capsule Take 100 mg by mouth 2 (two) times daily.   [name] Respiratory Therapy Supplies (FLUTTER) DEVI 1 puff by Does not apply route daily.  . rosuvastatin (CRESTOR) 5 MG tablet Take 5 mg by mouth daily.  [name] triamcinolone (KENALOG) 0.1 % SMARTSIG:2 Topical Every 3 Hours PRN  . triamterene-hydrochlorothiazide (MAXZIDE-25) 37.5-25 MG tablet Take 1 tablet by mouth daily.  . valACYclovir (VALTREX) 1000 MG tablet Take 4,000 mg by mouth once.  . verapamil (CALAN-SR) 120 MG CR tablet Take 120 mg by mouth daily.  . [DISCONTINUED] isosorbide mononitrate (IMDUR) 60 MG 24 hr tablet Take 60 mg by mouth daily.   Current Facility-Administered Medications for the 07/29/20 encounter (Office Visit) with Almyra Deforest, PA  Medication  . Mepolizumab  SOLR 100 mg     Allergies:   Allopurinol, Codeine, Levaquin [levofloxacin], Sulfa antibiotics, and Sulfonamide derivatives   Social History   Socioeconomic History  . Marital status: Widowed    Spouse name: Not on file  . Number of children: 0  . Years of education: Not on file  . Highest education level: Not on file  Occupational History  . Occupation: retired  Tobacco Use  . Smoking status: Former Smoker    Packs/[name]: 0.50    Years: 25.00    Pack years: 12.50    Quit date: 08/09/2003    Years since quitting: 16.9  . Smokeless tobacco: Never Used  Vaping Use  . Vaping Use: Never used  Substance and Sexual Activity  . Alcohol use: No    Alcohol/week: 0.0 standard drinks  . Drug use: No  . Sexual activity: Not on file  Other Topics Concern  . Not on file  Social History Narrative  . Not on file   Social Determinants of Health   Financial Resource Strain: Not on file  Food Insecurity: Not on file  Transportation Needs: Not on file  Physical Activity: Not on file  Stress: Not on file  Social Connections: Not on file     Family History: The patient's family history includes Heart attack in her sister; Heart disease in her brother and mother; Hypertension in her mother; Prostate cancer in her father; Stroke in her mother.  ROS:   Please see the history of present illness.     All other systems reviewed and are negative.  EKGs/Labs/Other Studies Reviewed:    The following studies were reviewed today:  Echo 04/23/2019 1. The left ventricle has normal systolic function with an ejection  fraction of 60-65%. The cavity size was normal. There is mildly increased  left ventricular wall thickness. Left ventricular diastolic parameters  were normal.  2. The right ventricle has normal systolic function. The cavity was  normal.  3. The tricuspid valve is grossly normal.  4. The aortic valve has an indeterminate number of cusps. Aortic valve  regurgitation is mild by  color flow Doppler. No stenosis of the aortic  valve.  5. The aorta is normal unless otherwise noted.  6. Normal LV  function; mild LVH; mild AI.    EKG:  EKG is ordered today.  The ekg ordered today demonstrates normal sinus rhythm, minimal T wave inversion in the anterior leads.  Recent Labs: 09/05/2019: Brain Natriuretic Peptide 11 04/26/2020: ALT 12 04/29/2020: BUN 15; Creatinine, Ser 0.97; Hemoglobin 8.5; Magnesium 1.9; Platelets 374; Potassium 4.4; Sodium 139  Recent Lipid Panel    Component Value Date/Time   CHOL 144 03/28/2018 1031   TRIG 207 (H) 03/28/2018 1031   HDL 39 (L) 03/28/2018 1031   CHOLHDL 3.7 03/28/2018 1031   CHOLHDL 2.7 05/30/2014 0400   VLDL 31 05/30/2014 0400   LDLCALC 64 03/28/2018 1031     Risk Assessment/Calculations:       Physical Exam:    VS:  BP 128/82   Pulse 65   Ht 5\' 2"  (1.575 m)   Wt 183 lb (83 kg)   SpO2 97%   BMI 33.47 kg/m     Wt Readings from Last 3 Encounters:  07/29/20 183 lb (83 kg)  06/11/20 181 lb 9.6 oz (82.4 kg)  04/28/20 184 lb 8.4 oz (83.7 kg)     GEN:  Well nourished, well developed in no acute distress HEENT: Normal NECK: No JVD; No carotid bruits LYMPHATICS: No lymphadenopathy CARDIAC: RRR, no murmurs, rubs, gallops RESPIRATORY:  Clear to auscultation without rales, wheezing or rhonchi  ABDOMEN: Soft, non-tender, non-distended MUSCULOSKELETAL:  No edema; No deformity  SKIN: Warm and dry NEUROLOGIC:  Alert and oriented x 3 PSYCHIATRIC:  Normal affect   ASSESSMENT:    1. Chest pain, unspecified type   2. Coronary artery disease involving native coronary artery of native heart with angina pectoris ([place])   3. Essential hypertension   4. Hyperlipidemia with target LDL less than 70   5. Controlled type 2 diabetes mellitus without complication, without long-term current use of insulin (HCC)    PLAN:    In order of problems listed above:  1. Chest discomfort: She has worsening anemia recently, she would not  be a good candidate for invasive study.  She also has a history of false positive stress test.  I recommended medical management for now, and if she fails medical management and underwent GI work-up, may consider invasive study in the future.  Will increase Imdur to 120 mg daily.  2. CAD: Patient recently has been having some chest discomfort.  3. Hypertension: Increase Imdur to 120 mg daily for antianginal purposes  4. Hyperlipidemia: On Crestor  5. DM2: Managed by primary care provider  6. Anemia: Worsening anemia based on recent lab work.  Last hemoglobin 8.5.  Waiting for GI work-up in January.        Medication Adjustments/Labs and Tests Ordered: Current medicines are reviewed at length with the patient today.  Concerns regarding medicines are outlined above.  Orders Placed This Encounter  Procedures  . EKG 12-Lead   Meds ordered this encounter  Medications  . isosorbide mononitrate (IMDUR) 120 MG 24 hr tablet    Sig: Take 1 tablet (120 mg total) by mouth daily.    Dispense:  30 tablet    Refill:  3    Patient Instructions  Medication Instructions:  INCREASE ISOSORBIDE 120MG  DAILY *If you need a refill on your cardiac medications before your next appointment, please call your pharmacy*  Lab Work:   Testing/Procedures:  NONE    NONE  Follow-Up: Your next appointment:  3 week(s) In Person with Denise Majestic, MD OR IF UNAVAILABLE Nicco Reaume PA-C At  [facility], you and your health needs are our priority.  As part of our continuing mission to provide you with exceptional heart care, we have created designated Provider Care Teams.  These Care Teams include your primary Cardiologist (physician) and Advanced Practice Providers (APPs -  Physician Assistants and Nurse Practitioners) who all work together to provide you with the care you need, when you need it.     Hilbert Corrigan, [place]  07/31/2020 10:08 PM    [facility]

## 2020-07-31 ENCOUNTER — Encounter: Payer: Self-pay | Admitting: Physician Assistant

## 2020-08-04 ENCOUNTER — Other Ambulatory Visit: Payer: Self-pay | Admitting: Cardiovascular Disease

## 2020-08-14 ENCOUNTER — Other Ambulatory Visit: Payer: Self-pay

## 2020-08-14 ENCOUNTER — Ambulatory Visit (INDEPENDENT_AMBULATORY_CARE_PROVIDER_SITE_OTHER): Payer: Medicare Other

## 2020-08-14 DIAGNOSIS — J4550 Severe persistent asthma, uncomplicated: Secondary | ICD-10-CM

## 2020-08-14 MED ORDER — MEPOLIZUMAB 100 MG ~~LOC~~ SOLR
100.0000 mg | Freq: Once | SUBCUTANEOUS | Status: AC
  Administered 2020-08-14: 100 mg via SUBCUTANEOUS

## 2020-08-14 NOTE — Progress Notes (Signed)
Have you been hospitalized within the last 10 days?  No Do you have a fever?  No Do you have a cough?  No Do you have a headache or sore throat? No  

## 2020-08-24 ENCOUNTER — Encounter: Payer: Self-pay | Admitting: Physician Assistant

## 2020-08-24 ENCOUNTER — Telehealth (INDEPENDENT_AMBULATORY_CARE_PROVIDER_SITE_OTHER): Payer: Medicare Other | Admitting: Physician Assistant

## 2020-08-24 ENCOUNTER — Telehealth: Payer: Self-pay

## 2020-08-24 VITALS — BP 111/63 | HR 79 | Wt 182.9 lb

## 2020-08-24 DIAGNOSIS — I10 Essential (primary) hypertension: Secondary | ICD-10-CM

## 2020-08-24 DIAGNOSIS — E785 Hyperlipidemia, unspecified: Secondary | ICD-10-CM | POA: Diagnosis not present

## 2020-08-24 DIAGNOSIS — E119 Type 2 diabetes mellitus without complications: Secondary | ICD-10-CM | POA: Diagnosis not present

## 2020-08-24 DIAGNOSIS — I25119 Atherosclerotic heart disease of native coronary artery with unspecified angina pectoris: Secondary | ICD-10-CM

## 2020-08-24 MED ORDER — RANOLAZINE ER 500 MG PO TB12: 500.0000 mg | ORAL_TABLET | Freq: Two times a day (BID) | ORAL | 3 refills | Status: DC

## 2020-08-24 MED ORDER — NITROGLYCERIN 0.4 MG/SPRAY TL SOLN: 1.0000 | 3 refills | Status: DC | PRN

## 2020-08-24 NOTE — Progress Notes (Signed)
Virtual Visit via Telephone Note   This visit type was conducted due to national recommendations for restrictions regarding the COVID-19 Pandemic (e.g. social distancing) in an effort to limit this patient's exposure and mitigate transmission in our community.  Due to her co-morbid illnesses, this patient is at least at moderate risk for complications without adequate follow up.  This format is felt to be most appropriate for this patient at this time.  The patient did not have access to video technology/had technical difficulties with video requiring transitioning to audio format only (telephone).  All issues noted in this document were discussed and addressed.  No physical exam could be performed with this format.  Please refer to the patient's chart for her  consent to telehealth for [facility].    Date:  08/24/2020   ID:  [name], DOB 1956-01-19, MRN [id number] The patient was identified using 2 identifiers.  Patient Location: Home Provider Location: Home Office  PCP:  Reynold Bowen, MD  Cardiologist:  Shelva Majestic, MD  Electrophysiologist:  None   Evaluation Performed:  Follow-Up Visit  Chief Complaint:  Follow up  History of Present Illness:    [name] is a 65 y.o. female with hx of CAD, OSA on CPAP, HTN, HLD, DM 2, severe persistent asthma followed by Dr. Ander Slade and hypothyroidism.  Patient underwent DES to proximal RCA in 2007.  She also had concomitant 60 to 70% disease in LAD, 20% disease in left circumflex artery at the time.  Repeat cardiac catheterization in July 2010 showed 60% LAD disease, 20 to 30% left circumflex disease, widely patent RCA stent.  She had a high risk Myoview in 2015 and underwent repeat cardiac catheterization in October 2015 that showed stable anatomy that is unchanged when compared to the previous cath, 50% proximal to mid LAD stenosis with FFR of 0.94.  Echocardiogram in September 2020 showed normal systolic and diastolic  function with mild LVH, moderate AI.   I last saw the patient on 11/26/2019, she mentioned to me that she was admitted several months prior to the visit but the rash that was concerning for possible Stevens-Johnson syndrome.  Her lab work also revealed worsening anemia and she was planning to see GI doctor in January.  She mentioned her thyroid was low and her PCP plan to increase her levothyroxine.  She was also on triamterene-hydrochlorothiazide that was not on her medication list.  Blood pressure was well controlled.  She mention she was having intermittent chest discomfort.  We discussed various options.  She had a history of false positive stress test, therefore I did not wish to repeat a stress test.  The remaining option would be either medical therapy versus cardiac catheterization.  Due to her anemia, I was hesitant to proceed with cardiac catheterization which may require her to be on dual antiplatelet therapy afterward.  I recommend proceed with GI work-up.  I increased her Imdur to 120 mg daily.  She presents today for follow-up.  Patient presents today for follow-up.  Her chest discomfort has decreased in frequency and now occurs about 2-3 times a week.  I recommended addition of Ranexa 500 mg twice a day to her medical regiment.  I have also given her some sublingual nitroglycerin as well and instructed the patient to seek urgent medical attention if her chest pain does not improve.  Otherwise I would recommend a sooner follow-up in 1 month to reassess her symptoms.  Again given the previous history of  false positive stress test, I did not try to order another stress test.  Option is limited to either medical management versus cardiac catheterization.   The patient does not have symptoms concerning for COVID-19 infection (fever, chills, cough, or new shortness of breath).    Past Medical History:  Diagnosis Date  . Acute bronchitis   . Anemia    hx of anemia  . Anginal pain ([place])   .  Arthritis    osteo  . Asthma   . Coronary atherosclerosis   . Cough   . GERD (gastroesophageal reflux disease)   . History of colon polyps   . History of migraine   . Hyperlipidemia   . Hypertension   . Obstructive sleep apnea (adult) (pediatric)    NO CPAP   . Peripheral neuropathy   . Type II or unspecified type diabetes mellitus without mention of complication, not stated as uncontrolled    type 2  . Unspecified hypothyroidism    Past Surgical History:  Procedure Laterality Date  . ANTERIOR FUSION CERVICAL SPINE    . CARPAL TUNNEL RELEASE Left   . COLONOSCOPY    . CORONARY ANGIOPLASTY    . LEFT HEART CATHETERIZATION WITH CORONARY ANGIOGRAM N/A 05/29/2014   Procedure: LEFT HEART CATHETERIZATION WITH CORONARY ANGIOGRAM;  Surgeon: Sinclair Grooms, MD;  Location: [facility] CATH LAB;  Service: Cardiovascular;  Laterality: N/A;  . 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   Procedure: TOTAL KNEE ARTHROPLASTY;  Surgeon: Susa Day, MD;  Location: [facility] ORS;  Service: Orthopedics;  Laterality: Left;  3 hrs  . TUBAL LIGATION    . WISDOM TOOTH EXTRACTION       Current Meds  Medication Sig  . albuterol (PROVENTIL HFA) 108 (90 BASE) MCG/ACT inhaler Inhale 2 puffs into the lungs every 6 (six) hours as needed for wheezing or shortness of breath. For shortness of breath and wheezing  . Carboxymethylcellulose Sodium (THERATEARS) 0.25 % SOLN Place 1 drop into both eyes 3 (three) times daily.  . diazepam (VALIUM) 5 MG tablet Take 5 mg by mouth daily as needed (cramps).   . diclofenac (VOLTAREN) 75 MG EC tablet Take 75 mg by mouth 2 (two) times daily.  . diclofenac Sodium (VOLTAREN) 1 % GEL Apply 1 application topically 4 (four) times daily.  . fluconazole (DIFLUCAN) 100 MG tablet Take 1 tablet (100 mg total) by mouth at bedtime.  . fluticasone (FLONASE) 50 MCG/ACT nasal spray Place 2 sprays into both nostrils daily. (Patient taking differently: Place 2 sprays into both nostrils daily as needed for  allergies.)  . Fluticasone-Umeclidin-Vilant (TRELEGY ELLIPTA) 200-62.5-25 MCG/INH AEPB Inhale 1 puff into the lungs daily.  . furosemide (LASIX) 40 MG tablet Take 40 mg by mouth 2 (two) times daily.  . insulin glargine (LANTUS) 100 UNIT/ML Solostar Pen Inject 40 Units into the skin at bedtime.   . insulin lispro (HUMALOG) 100 UNIT/ML KwikPen Inject 5-10 Units into the skin See admin instructions. Inject 5 units subcutaneously up to three times daily if CBG >200, inject 10 units up to three times daily if CBG >300  . ipratropium (ATROVENT) 0.02 % nebulizer solution Inhale into the lungs.  Marland Kitchen ipratropium-albuterol (DUONEB) 0.5-2.5 (3) MG/3ML SOLN Take 3 mLs by nebulization every 4 (four) hours as needed. (Patient taking differently: Take 3 mLs by nebulization every 4 (four) hours as needed (wheezing/shortness of breath.).)  . isosorbide mononitrate (IMDUR) 120 MG 24 hr tablet Take 1 tablet (120 mg total) by mouth daily.  Marland Kitchen JARDIANCE  10 MG TABS tablet Take 10 mg by mouth daily.  . Lancets (ONETOUCH DELICA PLUS [id number]) MISC Apply 1 each topically 4 (four) times daily.  Marland Kitchen levothyroxine (SYNTHROID) 100 MCG tablet Take 150 mcg by mouth daily before breakfast.  . liraglutide (VICTOZA) 18 MG/3ML SOPN Inject 1.8 mg into the skin at bedtime.  Marland Kitchen loratadine (CLARITIN) 10 MG tablet loratadine 10 mg tablet  . metoprolol succinate (TOPROL-XL) 25 MG 24 hr tablet Take by mouth.  . montelukast (SINGULAIR) 10 MG tablet Take 1 tablet (10 mg total) by mouth at bedtime. (Patient taking differently: Take 10 mg by mouth at bedtime as needed (seasonal allergies).)  . NOVOFINE PLUS PEN NEEDLE 32G X 4 MM MISC Inject into the skin.  Marland Kitchen nystatin (MYCOSTATIN) 100000 UNIT/ML suspension Take 5 mLs by mouth 4 (four) times daily.  Marland Kitchen nystatin cream (MYCOSTATIN)   . ONE TOUCH ULTRA TEST test strip Use as directed  . potassium chloride SA (KLOR-CON) 20 MEQ tablet potassium chloride ER 20 mEq tablet,extended release  . pregabalin  (LYRICA) 100 MG capsule Take 100 mg by mouth 2 (two) times daily.   Marland Kitchen Respiratory Therapy Supplies (FLUTTER) DEVI 1 puff by Does not apply route daily.  . rosuvastatin (CRESTOR) 5 MG tablet Take 5 mg by mouth daily.  Marland Kitchen triamcinolone (KENALOG) 0.1 % SMARTSIG:2 Topical Every 3 Hours PRN  . triamterene-hydrochlorothiazide (MAXZIDE-25) 37.5-25 MG tablet Take 1 tablet by mouth daily.  . verapamil (CALAN-SR) 120 MG CR tablet Take 120 mg by mouth daily.   Current Facility-Administered Medications for the 08/24/20 encounter (Video Visit) with Almyra Deforest, PA  Medication  . Mepolizumab SOLR 100 mg     Allergies:   Allopurinol, Codeine, Levaquin [levofloxacin], Sulfa antibiotics, and Sulfonamide derivatives   Social History   Tobacco Use  . Smoking status: Former Smoker    Packs/day: 0.50    Years: 25.00    Pack years: 12.50    Quit date: 08/09/2003    Years since quitting: 17.0  . Smokeless tobacco: Never Used  Vaping Use  . Vaping Use: Never used  Substance Use Topics  . Alcohol use: No    Alcohol/week: 0.0 standard drinks  . Drug use: No     Family Hx: The patient's family history includes Heart attack in her sister; Heart disease in her brother and mother; Hypertension in her mother; Prostate cancer in her father; Stroke in her mother.  ROS:   Please see the history of present illness.     All other systems reviewed and are negative.   Prior CV studies:   The following studies were reviewed today:   Echo 04/23/2019 1. The left ventricle has normal systolic function with an ejection  fraction of 60-65%. The cavity size was normal. There is mildly increased  left ventricular wall thickness. Left ventricular diastolic parameters  were normal.  2. The right ventricle has normal systolic function. The cavity was  normal.  3. The tricuspid valve is grossly normal.  4. The aortic valve has an indeterminate number of cusps. Aortic valve  regurgitation is mild by color flow  Doppler. No stenosis of the aortic  valve.  5. The aorta is normal unless otherwise noted.  6. Normal LV function; mild LVH; mild AI.   Labs/Other Tests and Data Reviewed:    EKG:  An ECG dated 07/29/2020 was personally reviewed today and demonstrated:  Sinus rhythm with nonspecific T wave changes.  Recent Labs: 09/05/2019: Brain Natriuretic Peptide 11 04/26/2020: ALT 12 04/29/2020:  BUN 15; Creatinine, Ser 0.97; Hemoglobin 8.5; Magnesium 1.9; Platelets 374; Potassium 4.4; Sodium 139   Recent Lipid Panel Lab Results  Component Value Date/Time   CHOL 144 03/28/2018 10:31 AM   TRIG 207 (H) 03/28/2018 10:31 AM   HDL 39 (L) 03/28/2018 10:31 AM   CHOLHDL 3.7 03/28/2018 10:31 AM   CHOLHDL 2.7 05/30/2014 04:00 AM   LDLCALC 64 03/28/2018 10:31 AM    Wt Readings from Last 3 Encounters:  08/24/20 182 lb 14.4 oz (83 kg)  07/29/20 183 lb (83 kg)  06/11/20 181 lb 9.6 oz (82.4 kg)     Risk Assessment/Calculations:      Objective:    Vital Signs:  BP 111/63   Pulse 79   Wt 182 lb 14.4 oz (83 kg)   BMI 33.45 kg/m    VITAL SIGNS:  reviewed  ASSESSMENT & PLAN:    1. CAD: I increased her Imdur to 120 mg daily during the last office visit, her chest discomfort has improved in frequency however still occurs about 2-3 times a week.  I have recommended addition of Ranexa 500 mg twice a day to her medical regiment.  I will bring her back in 1 month for reassessment.  She has sublingual nitroglycerin at home  2. Hypertension: Blood pressure stable on current therapy.  3. Hyperlipidemia: On Crestor  4. DM2: Managed by primary care provider, on insulin   COVID-19 Education: The signs and symptoms of COVID-19 were discussed with the patient and how to seek care for testing (follow up with PCP or arrange E-visit).  The importance of social distancing was discussed today.  Time:   Today, I have spent 10 minutes with the patient with telehealth technology discussing the above problems.      Medication Adjustments/Labs and Tests Ordered: Current medicines are reviewed at length with the patient today.  Concerns regarding medicines are outlined above.   Tests Ordered: No orders of the defined types were placed in this encounter.   Medication Changes: No orders of the defined types were placed in this encounter.   Follow Up:  In Person in 1 month(s)  Signed, Almyra Deforest, [place]  08/24/2020 11:36 AM    [place]

## 2020-08-24 NOTE — Telephone Encounter (Signed)
  Patient Consent for Virtual Visit         [name] has provided verbal consent on 08/24/2020 for a virtual visit (video or telephone).   CONSENT FOR VIRTUAL VISIT FOR:  [name]  By participating in this virtual visit I agree to the following:  I hereby voluntarily request, consent and authorize [facility] and its employed or contracted physicians, physician assistants, nurse practitioners or other licensed health care professionals (the Practitioner), to provide me with telemedicine health care services (the "Services") as deemed necessary by the treating Practitioner. I acknowledge and consent to receive the Services by the Practitioner via telemedicine. I understand that the telemedicine visit will involve communicating with the Practitioner through live audiovisual communication technology and the disclosure of certain medical information by electronic transmission. I acknowledge that I have been given the opportunity to request an in-person assessment or other available alternative prior to the telemedicine visit and am voluntarily participating in the telemedicine visit.  I understand that I have the right to withhold or withdraw my consent to the use of telemedicine in the course of my care at any time, without affecting my right to future care or treatment, and that the Practitioner or I may terminate the telemedicine visit at any time. I understand that I have the right to inspect all information obtained and/or recorded in the course of the telemedicine visit and may receive copies of available information for a reasonable fee.  I understand that some of the potential risks of receiving the Services via telemedicine include:  Marland Kitchen Delay or interruption in medical evaluation due to technological equipment failure or disruption; . Information transmitted may not be sufficient (e.g. poor resolution of images) to allow for appropriate medical decision making by the  Practitioner; and/or  . In rare instances, security protocols could fail, causing a breach of personal health information.  Furthermore, I acknowledge that it is my responsibility to provide information about my medical history, conditions and care that is complete and accurate to the best of my ability. I acknowledge that Practitioner's advice, recommendations, and/or decision may be based on factors not within their control, such as incomplete or inaccurate data provided by me or distortions of diagnostic images or specimens that may result from electronic transmissions. I understand that the practice of medicine is not an exact science and that Practitioner makes no warranties or guarantees regarding treatment outcomes. I acknowledge that a copy of this consent can be made available to me via my patient portal ([facility]), or I can request a printed copy by calling the office of [facility].    I understand that my insurance will be billed for this visit.   I have read or had this consent read to me. . I understand the contents of this consent, which adequately explains the benefits and risks of the Services being provided via telemedicine.  . I have been provided ample opportunity to ask questions regarding this consent and the Services and have had my questions answered to my satisfaction. . I give my informed consent for the services to be provided through the use of telemedicine in my medical care

## 2020-08-24 NOTE — Patient Instructions (Addendum)
Medication Instructions:   START Ranolazine (Ranexa) 500 mg 2 times a day   USE Nitro Spray as needed for chest pain.  *If you need a refill on your cardiac medications before your next appointment, please call your pharmacy*  Lab Work: NONE ordered at this time of appointment   If you have labs (blood work) drawn today and your tests are completely normal, you will receive your results only by: Marland Kitchen MyChart Message (if you have MyChart) OR . A paper copy in the mail If you have any lab test that is abnormal or we need to change your treatment, we will call you to review the results.  Testing/Procedures: NONE ordered at this time of appointment   Follow-Up: At [facility], you and your health needs are our priority.  As part of our continuing mission to provide you with exceptional heart care, we have created designated Provider Care Teams.  These Care Teams include your primary Cardiologist (physician) and Advanced Practice Providers (APPs -  Physician Assistants and Nurse Practitioners) who all work together to provide you with the care you need, when you need it.  Your next appointment:   4 week(s)  The format for your next appointment:   In Person  Provider:   Almyra Deforest, PA-C   Other Instructions

## 2020-08-24 NOTE — Telephone Encounter (Signed)
Called patient [name] to discuss AVS instructions gave [name]'s recommendations and patient voiced understanding. AVS summary emailed to patient via [name].

## 2020-09-02 ENCOUNTER — Telehealth: Payer: Self-pay | Admitting: *Deleted

## 2020-09-02 NOTE — Telephone Encounter (Signed)
Primary Cardiologist:Thomas Claiborne Billings, MD  Chart reviewed as part of pre-operative protocol coverage. Because of [name]'s past medical history and time since last visit, he/she will require a follow-up visit in order to better assess preoperative cardiovascular risk.  Pre-op covering staff: - Please schedule appointment and call patient to inform them. - Please contact requesting surgeon's office via preferred method (i.e, phone, fax) to inform them of need for appointment prior to surgery.  If applicable, this message will also be routed to pharmacy pool and/or primary cardiologist for input on holding anticoagulant/antiplatelet agent as requested below so that this information is available at time of patient's appointment.   Deberah Pelton, NP  09/02/2020, 3:45 PM

## 2020-09-02 NOTE — Telephone Encounter (Addendum)
   [facility] Pre-operative Risk Assessment    HEARTCARE STAFF: - Please ensure there is not already an duplicate clearance open for this procedure. - Under Visit Info/Reason for Call, type in Other and utilize the format Clearance MM/DD/YY or Clearance TBD. Do not use dashes or single digits. - If request is for dental extraction, please clarify the # of teeth to be extracted.  Request for surgical clearance:  1. What type of surgery is being performed? COLONOSCOPY  2. When is this surgery scheduled? TBD  3. What type of clearance is required (medical clearance vs. Pharmacy clearance to hold med vs. Both) PHARMACY  4. Are there any medications that need to be held prior to surgery and how long? PLAVIX FOR 5 DAYS  5. Practice name and name of physician performing surgery? [facility] DR. BADREDDINE  6. What is the office phone number? [phone]   7.   What is the office fax number? [phone]  8.   Anesthesia type (None, local, MAC, general) ? NONE   [name], [name] 09/02/2020, 3:24 PM  _________________________________________________________________   (provider comments below)

## 2020-09-07 ENCOUNTER — Telehealth: Payer: Self-pay | Admitting: Pulmonary Disease

## 2020-09-07 NOTE — Telephone Encounter (Signed)
Nucala Order: '100mg'$  #1 Vial Order Date: 09/07/20 Expected date of arrival: 09/08/20 Ordered by: [place]: [name]

## 2020-09-08 NOTE — Telephone Encounter (Signed)
Nucala Shipment Received: '100mg'$  #1 vial Medication arrival date: 09/08/20 Lot #: [id number] Exp date: 12/06/2023 Received by: Elliot Dally

## 2020-09-14 ENCOUNTER — Ambulatory Visit: Payer: TRICARE For Life (TFL)

## 2020-09-15 ENCOUNTER — Telehealth: Payer: Self-pay | Admitting: Pulmonary Disease

## 2020-09-15 NOTE — Telephone Encounter (Signed)
ATC patient.  LM to call and schedule Nucala injection.

## 2020-09-17 NOTE — Telephone Encounter (Signed)
ATC Patient.  LM to call back to reschedule her Nucala injection.

## 2020-09-17 NOTE — Telephone Encounter (Signed)
Pt called back to schedule injection. Please advise [phone]

## 2020-09-18 NOTE — Telephone Encounter (Signed)
Pt called back to reschedule.

## 2020-09-21 NOTE — Telephone Encounter (Signed)
ATC Patient. LM to call back. Offered Friday 09/25/20 afternoon for Nucala injection.

## 2020-09-24 NOTE — Telephone Encounter (Signed)
ATC patient to schedule Nucala injection.  LMTCB.

## 2020-09-25 ENCOUNTER — Other Ambulatory Visit: Payer: Self-pay | Admitting: Cardiovascular Disease

## 2020-09-25 NOTE — Telephone Encounter (Signed)
ATC Patient to schedule Nucala injection next week. LM to call back.

## 2020-09-29 ENCOUNTER — Ambulatory Visit: Payer: Medicare Other | Admitting: Physician Assistant

## 2020-09-30 ENCOUNTER — Telehealth: Payer: Self-pay | Admitting: Pulmonary Disease

## 2020-09-30 NOTE — Telephone Encounter (Signed)
ATC Patient. LM on mobile and home VM's.

## 2020-09-30 NOTE — Telephone Encounter (Signed)
ATC Patient. Patient med is here and offered Friday afternoon injection at 3:30pm, if Patient available.  I have Monday 2/28 morning openings. LM for Patient to call and schedule Nucala injection.

## 2020-09-30 NOTE — Telephone Encounter (Signed)
See other encounter.

## 2020-10-01 NOTE — Telephone Encounter (Signed)
ATC Patient. LM to call back to schedule Nucala injection.

## 2020-10-02 NOTE — Telephone Encounter (Signed)
ATC Patient. LM to call back to schedule Nucala injection. Injection appointments are available Monday 10/05/20, if Patient is interested.

## 2020-10-02 NOTE — Telephone Encounter (Signed)
Patient returned call.  Patient scheduled 10/05/20 for Nucala injection.

## 2020-10-05 ENCOUNTER — Ambulatory Visit (INDEPENDENT_AMBULATORY_CARE_PROVIDER_SITE_OTHER): Payer: Medicare Other

## 2020-10-05 ENCOUNTER — Other Ambulatory Visit (HOSPITAL_COMMUNITY): Payer: Self-pay | Admitting: Pharmacy Technician

## 2020-10-05 ENCOUNTER — Other Ambulatory Visit: Payer: Self-pay

## 2020-10-05 DIAGNOSIS — J4550 Severe persistent asthma, uncomplicated: Secondary | ICD-10-CM | POA: Diagnosis not present

## 2020-10-05 MED ORDER — MEPOLIZUMAB 100 MG ~~LOC~~ SOLR
100.0000 mg | Freq: Once | SUBCUTANEOUS | Status: AC
  Administered 2020-10-05: 100 mg via SUBCUTANEOUS

## 2020-10-05 NOTE — Progress Notes (Signed)
Have you been hospitalized within the last 10 days?  No Do you have a fever?  No Do you have a cough?  No Do you have a headache or sore throat? No  

## 2020-10-09 ENCOUNTER — Telehealth: Payer: Self-pay | Admitting: Cardiovascular Disease

## 2020-10-09 NOTE — Telephone Encounter (Signed)
Initial request received 09/02/20 for pharmacy clearance only to hold Plavix 5 days prior to surgery. Noted on separate telephone encounter 09/02/20.  She was seen via telemedicine 08/24/20 by Almyra Deforest and recommended for 1 month follow up in person as she was still having chest discomfort despite Imdur being escalated to '120mg'$ . Ranexa '500mg'$  BID was started during virtual visit 08/24/20 and requires a follow up EKG which warrants an in office visit for assessment of QTc.   She has a hx ofCAD with DES to prox RCA 2007. Repeat catheterization July 2010 and October 2015 with patent stent and stable anatomy.  I will defer recommendations regarding Plavix hold to Dr. Claiborne Billings. In regards to medical clearance, our recommendation remains that she be seen in person per office protocols and [facility] recommendations.   Loel Dubonnet, NP

## 2020-10-09 NOTE — Telephone Encounter (Signed)
    Pt is calling to follow up clearance, advised that the reason why clearance has not been sent yet because she missed her appt on 09/29/20 with Almyra Deforest and need to be seen to get clearance. She said she already been seen multiple times and doesn't need an appt just to get clearance for the coloscopy. Pt would like to speak with Dr. Evette Georges nurse

## 2020-10-09 NOTE — Telephone Encounter (Signed)
Returned the call to the patient. She is trying to have her colonoscopy completed and is waiting for clearance. She had a follow up in December and January and then was due another follow up in February but missed that one.  She would like to know if she can get this cleared since she needs the colonoscopy.

## 2020-10-12 NOTE — Telephone Encounter (Signed)
Attempted to contact patient. Attempted twice, number was busy- will try again later.

## 2020-10-12 NOTE — Telephone Encounter (Signed)
Contacted patient to schedule appointment which she did make. Patient states she has not taken the Plavix in a long time and would like to know if she will still need appointment for clearance since she was seen in office last December and virtually in January.

## 2020-10-13 NOTE — Telephone Encounter (Signed)
   Primary Cardiologist: Shelva Majestic, MD  Chart reviewed as part of pre-operative protocol coverage. Because of [name]'s past medical history and time since last visit, she will require a follow-up visit in order to better assess preoperative cardiovascular risk.  Pre-op covering staff: - Please schedule appointment and call patient to inform them. If patient already had an upcoming appointment within acceptable timeframe, please add "pre-op clearance" to the appointment notes so provider is aware. - Please contact requesting surgeon's office via preferred method (i.e, phone, fax) to inform them of need for appointment prior to surgery.  If applicable, this message will also be routed to pharmacy pool and/or primary cardiologist for input on holding anticoagulant/antiplatelet agent as requested below so that this information is available to the clearing provider at time of patient's appointment.   Tami Lin Duke, PA  10/13/2020, 8:43 AM

## 2020-10-13 NOTE — Telephone Encounter (Signed)
Patient is scheduled for appointment with Patrick North on 11/04/20 at 10:15 AM

## 2020-10-18 ENCOUNTER — Other Ambulatory Visit: Payer: Self-pay | Admitting: Cardiovascular Disease

## 2020-10-25 ENCOUNTER — Other Ambulatory Visit: Payer: Self-pay | Admitting: Cardiovascular Disease

## 2020-11-02 ENCOUNTER — Other Ambulatory Visit: Payer: Self-pay

## 2020-11-02 ENCOUNTER — Ambulatory Visit (INDEPENDENT_AMBULATORY_CARE_PROVIDER_SITE_OTHER): Payer: Medicare Other

## 2020-11-02 ENCOUNTER — Other Ambulatory Visit: Payer: Self-pay | Admitting: Internal Medicine

## 2020-11-02 ENCOUNTER — Ambulatory Visit: Payer: Medicare Other

## 2020-11-02 VITALS — BP 134/80 | HR 66 | Temp 98.0°F | Resp 20

## 2020-11-02 DIAGNOSIS — J4550 Severe persistent asthma, uncomplicated: Secondary | ICD-10-CM | POA: Diagnosis not present

## 2020-11-02 DIAGNOSIS — N1831 Chronic kidney disease, stage 3a: Secondary | ICD-10-CM

## 2020-11-02 MED ORDER — ALBUTEROL SULFATE HFA 108 (90 BASE) MCG/ACT IN AERS: 2.0000 | INHALATION_SPRAY | Freq: Once | RESPIRATORY_TRACT | Status: DC | PRN

## 2020-11-02 MED ORDER — EPINEPHRINE 0.3 MG/0.3ML IJ SOAJ: 0.3000 mg | Freq: Once | INTRAMUSCULAR | Status: DC | PRN

## 2020-11-02 MED ORDER — DIPHENHYDRAMINE HCL 50 MG/ML IJ SOLN: 50.0000 mg | Freq: Once | INTRAMUSCULAR | Status: DC | PRN

## 2020-11-02 MED ORDER — MEPOLIZUMAB 100 MG/ML ~~LOC~~ SOSY
100.0000 mg | PREFILLED_SYRINGE | Freq: Once | SUBCUTANEOUS | Status: AC
  Administered 2020-11-02: 100 mg via SUBCUTANEOUS

## 2020-11-02 MED ORDER — FAMOTIDINE IN NACL 20-0.9 MG/50ML-% IV SOLN: 20.0000 mg | Freq: Once | INTRAVENOUS | Status: DC | PRN

## 2020-11-02 MED ORDER — SODIUM CHLORIDE 0.9 % IV SOLN: Freq: Once | INTRAVENOUS | Status: DC | PRN

## 2020-11-02 MED ORDER — METHYLPREDNISOLONE SODIUM SUCC 125 MG IJ SOLR: 125.0000 mg | Freq: Once | INTRAMUSCULAR | Status: DC | PRN

## 2020-11-02 NOTE — Progress Notes (Signed)
Diagnosis: Ashtma  Provider: Dr Marshell Garfinkel   Procedure: Injection, Medication: Nucala, '100mg'$  Site: subcutaneous left arm by Koren Shiver, RN  Discharge: Condition: Good, Destination: Home . AVS provided. by Koren Shiver, RN

## 2020-11-02 NOTE — Progress Notes (Signed)
Cardiology Office Note   Date:  11/04/2020   ID:  Posy, Savko 1955-10-30, MRN [id number]  PCP:  Reynold Bowen, MD  Cardiologist:  Shelva Majestic, MD EP: None  Chief Complaint  Patient presents with  . Pre-op Exam      History of Present Illness: [name] is a 65 y.o. female with a PMH of CAD s/p PCI/DES to RCA in 2007, HTN, HLD, DM type 2, severe persistent asthma, hypothyroidism, and OSA on CPAP, who presents for preop assessment.   Her cardiac history dates back to 2007 when she underwent a heart catheterization revealing severe RCA stenosis managed with PCI/DES, in addition to 60-70% LAD and 20% LCx stenosis at that time which were medically managed. More recently she had a high risk stress test in 2015 and underwent repeat heart catheterization at that time showing 50% p-mLAD stenosis with negative FFR and patent RCA stent. Her last echocardiogram 04/2019 showed EF 60-65%, normal LV diastolic function, mild LVH, and mild AI.   Her last visit with cardiology was a telemedicine visit 08/24/20 with Almyra Deforest, PA-C. She reported improvement in her chest discomfort following an increased in her imdur to '120mg'$  daily at her last visit 1 month prior, however she still reported 2-3 episodes of chest discomfort per week. She was started on ranexa '500mg'$  BID at that time and recommended to follow-up in 1 month, however missed her last visit. We received a subsequent preop request for an upcoming colonoscopy, for which this visit was scheduled.  She has been doing well from a cardiac standpoint since her last visit. She reports chest pain has resolved since starting ranexa. 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 She has some intermittent DOE which she attributes to her asthma and is strongly associated with air quality. She walks 1/2 mile at a time without anginal complaints. She denies palpitations,  dizziness, lightheadedness, syncope, orthopnea, or PND. She is frustrated by delay in her colonoscopy due to need for an in office visit. I explained reasoning behind this. Thankfully she has had no hematochezia or melena - her colonoscopy is to evaluate her Stevens-Johnson syndrome experienced after taking allopurinol last fall.     Past Medical History:  Diagnosis Date  . Acute bronchitis   . Anemia    hx of anemia  . Anginal pain ([place])   . Arthritis    osteo  . Asthma   . Coronary atherosclerosis   . Cough   . GERD (gastroesophageal reflux disease)   . History of colon polyps   . History of migraine   . Hyperlipidemia   . Hypertension   . Obstructive sleep apnea (adult) (pediatric)    NO CPAP   . Peripheral neuropathy   . Type II or unspecified type diabetes mellitus without mention of complication, not stated as uncontrolled    type 2  . Unspecified hypothyroidism     Past Surgical History:  Procedure Laterality Date  . ANTERIOR FUSION CERVICAL SPINE    . CARPAL TUNNEL RELEASE Left   . COLONOSCOPY    . CORONARY ANGIOPLASTY    . LEFT HEART CATHETERIZATION WITH CORONARY ANGIOGRAM N/A 05/29/2014   Procedure: LEFT HEART CATHETERIZATION WITH CORONARY ANGIOGRAM;  Surgeon: Sinclair Grooms, MD;  Location: [facility] CATH LAB;  Service: Cardiovascular;  Laterality: N/A;  . 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   Procedure: TOTAL KNEE  ARTHROPLASTY;  Surgeon: Susa Day, MD;  Location: [facility] ORS;  Service: Orthopedics;  Laterality: Left;  3 hrs  . TUBAL LIGATION    . WISDOM TOOTH EXTRACTION       Current Outpatient Medications  Medication Sig Dispense Refill  . albuterol (PROVENTIL HFA) 108 (90 BASE) MCG/ACT inhaler Inhale 2 puffs into the lungs every 6 (six) hours as needed for wheezing or shortness of breath. For shortness of breath and wheezing 1 Inhaler prn  . diazepam (VALIUM) 5 MG tablet Take 5 mg by mouth daily as needed (cramps).     . diclofenac (VOLTAREN) 75 MG EC tablet  Take 75 mg by mouth 2 (two) times daily.    . diclofenac Sodium (VOLTAREN) 1 % GEL Apply 1 application topically 4 (four) times daily.    . fluticasone (FLONASE) 50 MCG/ACT nasal spray Place 2 sprays into both nostrils daily. (Patient taking differently: Place 2 sprays into both nostrils daily as needed for allergies.) 18.2 mL 11  . Fluticasone-Umeclidin-Vilant (TRELEGY ELLIPTA) 200-62.5-25 MCG/INH AEPB Inhale 1 puff into the lungs daily. 1 each 11  . furosemide (LASIX) 40 MG tablet Take 40 mg by mouth 2 (two) times daily.    . insulin glargine (LANTUS) 100 UNIT/ML Solostar Pen Inject 40 Units into the skin at bedtime.     . insulin lispro (HUMALOG) 100 UNIT/ML KwikPen Inject 5-10 Units into the skin See admin instructions. Inject 5 units subcutaneously up to three times daily if CBG >200, inject 10 units up to three times daily if CBG >300    . ipratropium (ATROVENT) 0.02 % nebulizer solution Inhale into the lungs.    Marland Kitchen ipratropium-albuterol (DUONEB) 0.5-2.5 (3) MG/3ML SOLN Take 3 mLs by nebulization every 4 (four) hours as needed. (Patient taking differently: Take 3 mLs by nebulization every 4 (four) hours as needed (wheezing/shortness of breath.).) 360 mL 11  . isosorbide mononitrate (IMDUR) 120 MG 24 hr tablet Take 1 tablet (120 mg total) by mouth daily. 30 tablet 3  . JARDIANCE 10 MG TABS tablet Take 10 mg by mouth daily.    . Lancets (ONETOUCH DELICA PLUS 123XX123) MISC Apply 1 each topically 4 (four) times daily.    Marland Kitchen levothyroxine (SYNTHROID) 100 MCG tablet Take 150 mcg by mouth daily before breakfast.    . liraglutide (VICTOZA) 18 MG/3ML SOPN Inject 1.8 mg into the skin at bedtime.    Marland Kitchen loratadine (CLARITIN) 10 MG tablet loratadine 10 mg tablet    . metoprolol succinate (TOPROL-XL) 25 MG 24 hr tablet Take 37.5 mg by mouth in the morning and at bedtime.    . montelukast (SINGULAIR) 10 MG tablet Take 1 tablet (10 mg total) by mouth at bedtime. (Patient taking differently: Take 10 mg by mouth  at bedtime as needed (seasonal allergies).) 30 tablet 11  . nitroGLYCERIN (NITROLINGUAL) 0.4 MG/SPRAY spray Place 1 spray under the tongue every 5 (five) minutes x 3 doses as needed for chest pain. 12 g 3  . NOVOFINE PLUS PEN NEEDLE 32G X 4 MM MISC Inject into the skin.    Marland Kitchen nystatin (MYCOSTATIN) 100000 UNIT/ML suspension Take 5 mLs by mouth 4 (four) times daily.    . ONE TOUCH ULTRA TEST test strip Use as directed    . potassium chloride SA (KLOR-CON) 20 MEQ tablet potassium chloride ER 20 mEq tablet,extended release    . pregabalin (LYRICA) 100 MG capsule Take 100 mg by mouth 2 (two) times daily.     . ranolazine (RANEXA) 500 MG  12 hr tablet Take 1 tablet (500 mg total) by mouth 2 (two) times daily. 180 tablet 3  . Respiratory Therapy Supplies (FLUTTER) DEVI 1 puff by Does not apply route daily. 1 each 0  . rosuvastatin (CRESTOR) 5 MG tablet TAKE ONE TABLET BY MOUTH DAILY 30 tablet 11  . triamcinolone (KENALOG) 0.1 % SMARTSIG:2 Topical Every 3 Hours PRN    . triamterene-hydrochlorothiazide (MAXZIDE-25) 37.5-25 MG tablet Take 1 tablet by mouth daily.    . verapamil (CALAN-SR) 240 MG CR tablet Take 240 mg by mouth daily.     Current Facility-Administered Medications  Medication Dose Route Frequency Provider Last Rate Last Admin  . Mepolizumab SOLR 100 mg  100 mg Subcutaneous Q28 days Julian Hy, DO   100 mg at 05/21/20 1016    Allergies:   Allopurinol, Codeine, Levaquin [levofloxacin], Sulfa antibiotics, and Sulfonamide derivatives    Social History:  The patient  reports that she quit smoking about 17 years ago. She has a 12.50 pack-year smoking history. She has never used smokeless tobacco. She reports that she does not drink alcohol and does not use drugs.   Family History:  The patient's family history includes Heart attack in her sister; Heart disease in her brother and mother; Hypertension in her mother; Prostate cancer in her father; Stroke in her mother.    ROS:  Please see the  history of present illness.   Otherwise, review of systems are positive for none.   All other systems are reviewed and negative.    PHYSICAL EXAM: VS:  BP 138/72   Pulse 70   Ht '5\' 2"'$  (1.575 m)   Wt 185 lb 3.2 oz (84 kg)   BMI 33.87 kg/m  , BMI Body mass index is 33.87 kg/m. GEN: Well nourished, well developed, in no acute distress HEENT: normal Neck: no JVD, carotid bruits, or masses Cardiac: RRR; no murmurs, rubs, or gallops, no edema  Respiratory:  clear to auscultation bilaterally, normal work of breathing GI: soft, nontender, nondistended, + BS MS: no deformity or atrophy Skin: warm and dry, no rash Neuro:  Strength and sensation are intact Psych: euthymic mood, full affect   EKG:  EKG is ordered today. The ekg ordered today demonstrates sinus rhythm, rate 70bpm, non-specific T wave abnormalities, no STE/D, no significant change from previous.   Recent Labs: 04/26/2020: ALT 12 04/29/2020: BUN 15; Creatinine, Ser 0.97; Hemoglobin 8.5; Magnesium 1.9; Platelets 374; Potassium 4.4; Sodium 139    Lipid Panel    Component Value Date/Time   CHOL 144 03/28/2018 1031   TRIG 207 (H) 03/28/2018 1031   HDL 39 (L) 03/28/2018 1031   CHOLHDL 3.7 03/28/2018 1031   CHOLHDL 2.7 05/30/2014 0400   VLDL 31 05/30/2014 0400   LDLCALC 64 03/28/2018 1031      Wt Readings from Last 3 Encounters:  11/04/20 185 lb 3.2 oz (84 kg)  08/24/20 182 lb 14.4 oz (83 kg)  07/29/20 183 lb (83 kg)      Other studies Reviewed: Additional studies/ records that were reviewed today include:   Echocardiogram 04/2019: 1. The left ventricle has normal systolic function with an ejection  fraction of 60-65%. The cavity size was normal. There is mildly increased  left ventricular wall thickness. Left ventricular diastolic parameters  were normal.  2. The right ventricle has normal systolic function. The cavity was  normal.  3. The tricuspid valve is grossly normal.  4. The aortic valve has an  indeterminate number of cusps.  Aortic valve  regurgitation is mild by color flow Doppler. No stenosis of the aortic  valve.  5. The aorta is normal unless otherwise noted.  6. Normal LV function; mild LVH; mild AI.  Left heart catheterization 2015: IMPRESSIONS:  1. Widely patent right coronary stent 2. Segmental 50% proximal to mid LAD stenosis with FFR of 0.94. The angiographic appearance is unchanged from 2010. 3. False positive myocardial perfusion study 4. Normal left ventricular function 5. Irregularities noted in the circumflex  RECOMMENDATION:  Continued medical therapy, which include aggressive risk factor modification. There is no indication for intervention on the LAD which supplies a territory that caused the appearance of ischemia on nuclear scintigraphy. It would appear this time that the study was false positive. Other explanations might include vasovagal reactive blood flow disturbance during the procedure with coronary spasm being induced. In the cath lab adenosine perfusion did not demonstrate evidence of hemodynamic significance in the proximal to mid LAD stenosis.   NST 2015: Overall Impression:  High risk stress nuclear study with a large, moderate intensity, reversible distal anterior, apical and inferior defect consistent with severe ischemia; also with possible transient ischemic dilatation of LV cavity; note CP and ECG changes as well. Patient admitted for cardiac catheterization.  LV Wall Motion:  NL LV Function; NL Wall Motion     ASSESSMENT AND PLAN:   1. Preop assessment: patient is anticipating an upcoming colonoscopy. She reports she has not been on plavix for the past 6+ months. She is able to complete 4 METs without anginal complaints - Based on ACC/AHA guidelines, [name] would be at acceptable risk for the planned procedure without further cardiovascular testing.  - I will route this recommendation to the requesting party via [facility] fax  function  - Please note, patient is not on plavix, however was recommended to start aspirin '81mg'$  daily at this time. If needed, she can hold 7 days prior to her upcoming colonoscopy  2. CAD s/p PCI/DES to RCA in 2007:  - Would start aspirin 81 mg daily  - Continue statin - Continue BBlocker - Continue imdur and ranexa  3. HTN: BP 138/72 today - Continue lasix, metoprolol, and verapamil  4. HLD: LDL 68 07/2020 - Continue rosuvastatin  5. DM type 2: A1C 7.1 07/2020 - Continue jardiance, victoza, and insulin  6. OSA: on CPAP - Continue CPAP    Current medicines are reviewed at length with the patient today.  The patient does not have concerns regarding medicines.  The following changes have been made:  As above  Labs/ tests ordered today include:  No orders of the defined types were placed in this encounter.    Disposition:   FU with Dr. Claiborne Billings in June as previously scheduled Signed, Abigail Butts, PA-C  11/04/2020 11:06 AM

## 2020-11-04 ENCOUNTER — Ambulatory Visit (INDEPENDENT_AMBULATORY_CARE_PROVIDER_SITE_OTHER): Payer: Medicare Other | Admitting: Medical

## 2020-11-04 ENCOUNTER — Other Ambulatory Visit: Payer: Self-pay

## 2020-11-04 ENCOUNTER — Encounter: Payer: Self-pay | Admitting: Medical

## 2020-11-04 VITALS — BP 138/72 | HR 70 | Ht 62.0 in | Wt 185.2 lb

## 2020-11-04 DIAGNOSIS — I25119 Atherosclerotic heart disease of native coronary artery with unspecified angina pectoris: Secondary | ICD-10-CM | POA: Diagnosis not present

## 2020-11-04 DIAGNOSIS — Z01810 Encounter for preprocedural cardiovascular examination: Secondary | ICD-10-CM

## 2020-11-04 DIAGNOSIS — E119 Type 2 diabetes mellitus without complications: Secondary | ICD-10-CM

## 2020-11-04 DIAGNOSIS — E785 Hyperlipidemia, unspecified: Secondary | ICD-10-CM | POA: Diagnosis not present

## 2020-11-04 DIAGNOSIS — I10 Essential (primary) hypertension: Secondary | ICD-10-CM

## 2020-11-04 NOTE — Patient Instructions (Signed)
Medication Instructions:  START- Aspirin 81 mg by mouth daily  *If you need a refill on your cardiac medications before your next appointment, please call your pharmacy*   Lab Work: None Ordered   Testing/Procedures: None Ordered   Follow-Up: At [facility], you and your health needs are our priority.  As part of our continuing mission to provide you with exceptional heart care, we have created designated Provider Care Teams.  These Care Teams include your primary Cardiologist (physician) and Advanced Practice Providers (APPs -  Physician Assistants and Nurse Practitioners) who all work together to provide you with the care you need, when you need it.  We recommend signing up for the patient portal called "MyChart".  Sign up information is provided on this After Visit Summary.  MyChart is used to connect with patients for Virtual Visits (Telemedicine).  Patients are able to view lab/test results, encounter notes, upcoming appointments, etc.  Non-urgent messages can be sent to your provider as well.   To learn more about what you can do with MyChart, go to [email or web address].    Your next appointment:   Keep appointment with Dr Claiborne Billings in June

## 2020-11-09 NOTE — Addendum Note (Signed)
Addended by: Vennie Homans on: 11/09/2020 03:19 PM   Modules accepted: Orders

## 2020-11-09 NOTE — Progress Notes (Signed)
Diagnosis: Asthma  Provider:  Praveen Mannam, MD  Procedure: Injection  Nucala (Mepolizumab), Dose: 100 mg, Site: subcutaneous  Discharge: Condition: Good, Destination: Home . AVS provided to patient.   Performed by:  Brianne Maina, RN        

## 2020-11-18 ENCOUNTER — Telehealth: Payer: Self-pay | Admitting: Pulmonary Disease

## 2020-11-18 ENCOUNTER — Ambulatory Visit
Admission: RE | Admit: 2020-11-18 | Discharge: 2020-11-18 | Disposition: A | Discharge status: Home / Self Care | Payer: Medicare Other | Source: Ambulatory Visit | Attending: Internal Medicine | Admitting: Internal Medicine

## 2020-11-18 DIAGNOSIS — J4551 Severe persistent asthma with (acute) exacerbation: Secondary | ICD-10-CM

## 2020-11-18 DIAGNOSIS — N1831 Chronic kidney disease, stage 3a: Secondary | ICD-10-CM

## 2020-11-18 NOTE — Telephone Encounter (Signed)
ATC x2.  Left vm to return call.

## 2020-11-18 NOTE — Telephone Encounter (Signed)
This encounter was created in error - please disregard.

## 2020-11-18 NOTE — Telephone Encounter (Signed)
Tried calling pt and there was no answer- LMTCB and will try again later.

## 2020-11-19 MED ORDER — ALBUTEROL SULFATE HFA 108 (90 BASE) MCG/ACT IN AERS: 2.0000 | INHALATION_SPRAY | Freq: Four times a day (QID) | RESPIRATORY_TRACT | 2 refills | Status: DC | PRN

## 2020-11-19 MED ORDER — PREDNISONE 20 MG PO TABS: 20.0000 mg | ORAL_TABLET | Freq: Every day | ORAL | 0 refills | Status: AC

## 2020-11-19 MED ORDER — IPRATROPIUM-ALBUTEROL 0.5-2.5 (3) MG/3ML IN SOLN: 3.0000 mL | RESPIRATORY_TRACT | 11 refills | Status: AC | PRN

## 2020-11-19 NOTE — Telephone Encounter (Signed)
Please verify that she is taken Trelegy inhaler that is her maintenance asthma inhaler.  Sounds like she is having a mild asthma flare.  We will continue on her current maintenance regimen.  She may begin prednisone 20 mg daily for 5 days.  If symptoms or not improving she will need an office visit for further evaluation  Please contact office for sooner follow up if symptoms do not improve or worsen or seek emergency care

## 2020-11-19 NOTE — Telephone Encounter (Signed)
ATC patient on mobile number x3    Called her home # [phone], reached patient and spoke with her regarding her symptoms.   Primary Pulmonologist: Ol[place] Last office visit and with whom: 06/11/21 Olalere What do we see them for (pulmonary problems): Severe persistent asthma (esophilic asthma) Last OV assessment/plan:  Assessment & Plan:   Severe persistent asthma with acute exacerbation; elevated IgE & eosinophils -Continue Trelegy -Continue bronchodilator treatments -Continue nasal steroids and antihistamines -Continue Nucala  Chronic rhinosinusitis -Continue to follow closely  History of GERD -Continue antireflux medications  Recent Stevens-Johnson syndrome -Breathing does not appear to be significantly impacted during the exacerbation -We will continue to monitor -Encouraged to call with any significant problems with her breathing  Follow-up in 3 months      Patient Instructions by Laurin Coder, MD at 06/11/2020 10:30 AM  Author: Laurin Coder, MD Author Type: Physician Filed: 06/11/2020 10:58 AM  Note Status: Signed Cosign: Cosign Not Required Encounter Date: 06/11/2020  Editor: Laurin Coder, MD (Physician)             Severe persistent asthma on Nucala  -Continue schedule with Nucala -Continue Trelegy -Avoid known triggers  Recent illness with Celene Kras syndrome -Will take time before things settle down  Will not escalate any treatment at present -If you don't continue to feel better then we will get a chest x-ray, breathing study  Call with significant concerns  Follow-up in 3 months         Instructions  Severe persistent asthma on Nucala  -Continue schedule with Nucala -Continue Trelegy -Avoid known triggers  Recent illness with Celene Kras syndrome -Will take time before things settle down  Will not escalate any treatment at present -If you don't continue to feel better then we will get a chest  x-ray, breathing study  Call with significant concerns  Follow-up in 3 months       Reason for call: She saw Dr. Forde Dandy last week and he heard wheezing and was told to see her pulmonologist.  She thinks is d/t pollen.  She has sinus congestion, pain over her eyebrows bilaterally and headache at left temple.  She is getting nothing out when she blows her nose.  Her cough is dry.  She is wheezing.  She is continuing to get her Nucala, she has an albuterol inhaler which needs to be refilled (refilled for her), she has not used her duoneb, solution is expired (refilled), she is using her Flonase which she said is not helping and azesteline.  She denies any fever, chills or body aches.  She has not taken her temperature, she has been hot, but feels it is hot flashes.  She has had her covid vaccines and her flu vaccine.  Verified pharmacy on file is correct.  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  Tammy, please advise.  Thank you.  (examples of things to ask: : When did symptoms start? Fever? Cough? Productive? Color to sputum? More sputum than usual? Wheezing? Have you needed increased oxygen? Are you taking your respiratory medications? What over the counter measures have you tried?)  Allergies  Allergen Reactions  . Allopurinol Rash    Toxic epidermal necrolysis   . Codeine Other (See Comments)    REACTION: hallucinations/"loopy"  . Levaquin [Levofloxacin] Nausea And Vomiting  . Sulfa Antibiotics Hives  . Sulfonamide  Derivatives Hives    Immunization History  Administered Date(s) Administered  . Fluad Quad(high Dose 65+) 06/05/2019  . Influenza Split 05/10/2011, 05/09/2012, 05/31/2013  . Influenza Whole 05/08/2010, 05/26/2020  . Influenza,inj,Quad PF,6+ Mos 09/14/2015  . Influenza-Unspecified 06/04/2014  . PFIZER(Purple Top)SARS-COV-2 Vaccination 10/03/2019, 10/25/2019  .  Pneumococcal Polysaccharide-23 08/09/2007

## 2020-11-19 NOTE — Telephone Encounter (Signed)
Spoke with patient to make sure that she is using her Trelegy inahler daily. She said she is using is everyday but feels like its not working like it used to. Advised she could do 20 mg of Prednisone for the next 5 days. She verified pharmacy. RX has been sent. Advised patient if she is not feeling better after completing Prednisone to either call the office for an appointment or go to urgent care. She expressed understanding. Nothing further needed at this time.

## 2020-11-19 NOTE — Telephone Encounter (Signed)
ATC patient at [phone] [facility]

## 2020-11-23 NOTE — Telephone Encounter (Signed)
[place]

## 2020-11-26 ENCOUNTER — Telehealth: Payer: Self-pay | Admitting: Pulmonary Disease

## 2020-11-26 DIAGNOSIS — J309 Allergic rhinitis, unspecified: Secondary | ICD-10-CM

## 2020-11-26 DIAGNOSIS — J4551 Severe persistent asthma with (acute) exacerbation: Secondary | ICD-10-CM

## 2020-11-26 NOTE — Telephone Encounter (Signed)
Tried to call patient, no answer. Left VM

## 2020-11-26 NOTE — Telephone Encounter (Signed)
Attempted to call pt but line went straight to VM. Left message for her to return call.  We will attempt to call pt tomorrow 4/22 if pt has not returned call back to [place] prior to [place] closing the encounter.

## 2020-11-26 NOTE — Telephone Encounter (Signed)
LMTCB

## 2020-11-27 ENCOUNTER — Other Ambulatory Visit: Payer: Self-pay | Admitting: Primary Care

## 2020-11-27 MED ORDER — NYSTATIN 100000 UNIT/ML MT SUSP: 5.0000 mL | Freq: Four times a day (QID) | OROMUCOSAL | 0 refills | Status: DC

## 2020-11-27 MED ORDER — ALBUTEROL SULFATE HFA 108 (90 BASE) MCG/ACT IN AERS: 2.0000 | INHALATION_SPRAY | Freq: Four times a day (QID) | RESPIRATORY_TRACT | 2 refills | Status: AC | PRN

## 2020-11-27 NOTE — Telephone Encounter (Signed)
Dr. Ander Slade patient last seen on 06/11/2020 for asthma. No pending appt.   Called and spoke to patient.  Patient reports of white patches on tongue, side of mouth and on lips. Her Mouth is very sore. she is unable to brush her teeth due to discomfort.  She is rinsing her mouth with water after using trelegy.  Patient stated that she has been on trelegy for >1y. She develops thrush from time to time.   Beth, please advise. OA is unavailable.

## 2020-11-27 NOTE — Telephone Encounter (Signed)
I tried to call patient to go over Beth's recs but there was no answer. I left message according to DPR that I have sent in nystatin mouthwash to pharmacy on file and to call with any other questions/concers with office number. Nothing further needed.

## 2020-11-27 NOTE — Telephone Encounter (Signed)
Can you please send in Nystatin s/s

## 2020-11-30 ENCOUNTER — Ambulatory Visit (INDEPENDENT_AMBULATORY_CARE_PROVIDER_SITE_OTHER): Payer: Medicare Other

## 2020-11-30 ENCOUNTER — Other Ambulatory Visit: Payer: Self-pay

## 2020-11-30 VITALS — BP 105/67 | HR 84 | Temp 98.0°F | Resp 18

## 2020-11-30 DIAGNOSIS — J4550 Severe persistent asthma, uncomplicated: Secondary | ICD-10-CM | POA: Diagnosis not present

## 2020-11-30 MED ORDER — MEPOLIZUMAB 100 MG/ML ~~LOC~~ SOSY
100.0000 mg | PREFILLED_SYRINGE | Freq: Once | SUBCUTANEOUS | Status: AC
  Administered 2020-11-30: 100 mg via SUBCUTANEOUS
  Filled 2020-11-30: qty 1

## 2020-11-30 MED ORDER — FAMOTIDINE IN NACL 20-0.9 MG/50ML-% IV SOLN: 20.0000 mg | Freq: Once | INTRAVENOUS | Status: DC | PRN

## 2020-11-30 MED ORDER — EPINEPHRINE 0.3 MG/0.3ML IJ SOAJ: 0.3000 mg | Freq: Once | INTRAMUSCULAR | Status: DC | PRN

## 2020-11-30 MED ORDER — METHYLPREDNISOLONE SODIUM SUCC 125 MG IJ SOLR: 125.0000 mg | Freq: Once | INTRAMUSCULAR | Status: DC | PRN

## 2020-11-30 MED ORDER — DIPHENHYDRAMINE HCL 50 MG/ML IJ SOLN: 50.0000 mg | Freq: Once | INTRAMUSCULAR | Status: DC | PRN

## 2020-11-30 MED ORDER — ALBUTEROL SULFATE HFA 108 (90 BASE) MCG/ACT IN AERS: 2.0000 | INHALATION_SPRAY | Freq: Once | RESPIRATORY_TRACT | Status: DC | PRN

## 2020-11-30 MED ORDER — SODIUM CHLORIDE 0.9 % IV SOLN: Freq: Once | INTRAVENOUS | Status: DC | PRN

## 2020-11-30 NOTE — Progress Notes (Signed)
Diagnosis: Asthma  Provider:  Marshell Garfinkel, MD  Procedure: Injection  Nucala (Mepolizumab), Dose: 100 mg, Site: subcutaneous, left arm  Discharge: Condition: Good, Destination: Home . AVS provided to patient.   Performed by:  Koren Shiver, RN

## 2020-12-28 ENCOUNTER — Ambulatory Visit (INDEPENDENT_AMBULATORY_CARE_PROVIDER_SITE_OTHER): Payer: Medicare Other

## 2020-12-28 ENCOUNTER — Other Ambulatory Visit: Payer: Self-pay

## 2020-12-28 VITALS — BP 148/87 | HR 58 | Temp 97.7°F | Resp 20

## 2020-12-28 DIAGNOSIS — J4550 Severe persistent asthma, uncomplicated: Secondary | ICD-10-CM | POA: Diagnosis not present

## 2020-12-28 MED ORDER — MEPOLIZUMAB 100 MG/ML ~~LOC~~ SOSY
100.0000 mg | PREFILLED_SYRINGE | Freq: Once | SUBCUTANEOUS | Status: AC
  Administered 2020-12-28: 100 mg via SUBCUTANEOUS
  Filled 2020-12-28: qty 1

## 2020-12-28 MED ORDER — EPINEPHRINE 0.3 MG/0.3ML IJ SOAJ: 0.3000 mg | Freq: Once | INTRAMUSCULAR | Status: DC | PRN

## 2020-12-28 MED ORDER — ALBUTEROL SULFATE HFA 108 (90 BASE) MCG/ACT IN AERS: 2.0000 | INHALATION_SPRAY | Freq: Once | RESPIRATORY_TRACT | Status: DC | PRN

## 2020-12-28 MED ORDER — SODIUM CHLORIDE 0.9 % IV SOLN: Freq: Once | INTRAVENOUS | Status: DC | PRN

## 2020-12-28 MED ORDER — DIPHENHYDRAMINE HCL 50 MG/ML IJ SOLN: 50.0000 mg | Freq: Once | INTRAMUSCULAR | Status: DC | PRN

## 2020-12-28 MED ORDER — FAMOTIDINE IN NACL 20-0.9 MG/50ML-% IV SOLN: 20.0000 mg | Freq: Once | INTRAVENOUS | Status: DC | PRN

## 2020-12-28 MED ORDER — METHYLPREDNISOLONE SODIUM SUCC 125 MG IJ SOLR: 125.0000 mg | Freq: Once | INTRAMUSCULAR | Status: DC | PRN

## 2020-12-28 NOTE — Progress Notes (Signed)
Diagnosis: Asthma  Provider:  Marshell Garfinkel, MD  Procedure: Injection  Nucala, Dose: 100 mg, Site: subcutaneous, right arm  Discharge: Condition: Good, Destination: Home . AVS provided to patient.   Performed by:  Koren Shiver, RN

## 2021-01-11 ENCOUNTER — Telehealth: Payer: Self-pay | Admitting: Pulmonary Disease

## 2021-01-11 NOTE — Telephone Encounter (Signed)
Left message for patient to call back  

## 2021-01-11 NOTE — Telephone Encounter (Signed)
ATC, left VM for callback. 

## 2021-01-11 NOTE — Telephone Encounter (Signed)
Pt returning a phone call. Pt can be reached at [phone]

## 2021-01-11 NOTE — Telephone Encounter (Signed)
Called pt again [facility]

## 2021-01-13 MED ORDER — PREDNISONE 10 MG PO TABS: ORAL_TABLET | ORAL | 0 refills | Status: DC

## 2021-01-13 MED ORDER — DOXYCYCLINE HYCLATE 100 MG PO TABS: 100.0000 mg | ORAL_TABLET | Freq: Two times a day (BID) | ORAL | 0 refills | Status: DC

## 2021-01-13 NOTE — Telephone Encounter (Signed)
Primary Pulmonologist: AO Last office visit and with whom: 06/11/20 with AO What do we see them for (pulmonary problems): asthma Last OV assessment/plan: Severe persistent asthma on Nucala  -Continue schedule with Nucala -Continue Trelegy -Avoid known triggers  Recent illness with Celene Kras syndrome -Will take time before things settle down  Will not escalate any treatment at present -If you don't continue to feel better then we will get a chest x-ray, breathing study  Call with significant concerns  Follow-up in 3 months   Was appointment offered to patient (explain)?  Patient wanted recommendations first.    Reason for call: Called and spoke with patient. She stated she has developed a cough that started about 2 weeks ago. The cough at times has been productive with yellow phlegm. She does have chills but she has not been able to check her temp at home. She does have more SOB especially with exertion and humidity. She denied being around anyone who has been sick recently. She is UTD on her covid vaccines. So far she has used her albuterol HFA and nebulizer as well as Mucinex without any relief.   (examples of things to ask: : When did symptoms start? Fever? Cough? Productive? Color to sputum? More sputum than usual? Wheezing? Have you needed increased oxygen? Are you taking your respiratory medications? What over the counter measures have you tried?)  Allergies  Allergen Reactions  . Allopurinol Rash    Toxic epidermal necrolysis   . Codeine Other (See Comments)    REACTION: hallucinations/"loopy"  . Levaquin [Levofloxacin] Nausea And Vomiting  . Sulfa Antibiotics Hives  . Sulfonamide Derivatives Hives    Immunization History  Administered Date(s) Administered  . Fluad Quad(high Dose 65+) 06/05/2019  . Influenza Split 05/10/2011, 05/09/2012, 05/31/2013  . Influenza Whole 05/08/2010, 05/26/2020  . Influenza,inj,Quad PF,6+ Mos 09/14/2015  .  Influenza-Unspecified 06/04/2014  . PFIZER(Purple Top)SARS-COV-2 Vaccination 10/03/2019, 10/25/2019  . Pneumococcal Polysaccharide-23 08/09/2007   [facility] is [facility] [address], can you please advise? Thanks!

## 2021-01-13 NOTE — Telephone Encounter (Signed)
I called and spoke with the pt and notified of response per [facility]  She verbalized understanding  She states that she is taking her trelgy 200 daily and will continue this as well a neb 3-4 x daily until better  She will continue mucinex as well  She will call us back if not improving for ov

## 2021-01-13 NOTE — Telephone Encounter (Signed)
Forwarding to [place] since msg not answered this am and no Dr Jenetta Downer not available. Thanks.

## 2021-01-13 NOTE — Telephone Encounter (Signed)
Pt returning a phone call. Pt can be reached at [phone].

## 2021-01-13 NOTE — Telephone Encounter (Signed)
Cough x 2 weeks, productive at times with yellow mucus. More sob with exertion and heat/humidity. She is taking mucinex and Albuterol HFA. No known sick contacts.   I will send in rx for Doxycycline and prednisone taper for asthmatic bronchitis, acute exacerbation. Ensure she is still using Trelegy 200 one puff daily. She can use Duoneb 3-4 times a day instead of Albuterol hfa until better.Continue Mucinex '600mg'$  twice a day.

## 2021-01-20 ENCOUNTER — Ambulatory Visit (INDEPENDENT_AMBULATORY_CARE_PROVIDER_SITE_OTHER): Payer: Medicare Other | Admitting: Cardiovascular Disease

## 2021-01-20 ENCOUNTER — Other Ambulatory Visit: Payer: Self-pay

## 2021-01-20 ENCOUNTER — Encounter: Payer: Self-pay | Admitting: Cardiovascular Disease

## 2021-01-20 VITALS — BP 72/60 | HR 76 | Ht 62.0 in | Wt 174.4 lb

## 2021-01-20 DIAGNOSIS — I951 Orthostatic hypotension: Secondary | ICD-10-CM | POA: Diagnosis not present

## 2021-01-20 DIAGNOSIS — E118 Type 2 diabetes mellitus with unspecified complications: Secondary | ICD-10-CM | POA: Diagnosis not present

## 2021-01-20 DIAGNOSIS — L511 Stevens-Johnson syndrome: Secondary | ICD-10-CM

## 2021-01-20 DIAGNOSIS — Z794 Long term (current) use of insulin: Secondary | ICD-10-CM

## 2021-01-20 DIAGNOSIS — E785 Hyperlipidemia, unspecified: Secondary | ICD-10-CM | POA: Diagnosis not present

## 2021-01-20 DIAGNOSIS — I25119 Atherosclerotic heart disease of native coronary artery with unspecified angina pectoris: Secondary | ICD-10-CM | POA: Diagnosis not present

## 2021-01-20 DIAGNOSIS — G4733 Obstructive sleep apnea (adult) (pediatric): Secondary | ICD-10-CM

## 2021-01-20 MED ORDER — FUROSEMIDE 40 MG PO TABS: 40.0000 mg | ORAL_TABLET | Freq: Every day | ORAL | 3 refills | Status: AC

## 2021-01-20 MED ORDER — METOPROLOL SUCCINATE ER 25 MG PO TB24: ORAL_TABLET | ORAL | Status: DC

## 2021-01-20 NOTE — Progress Notes (Signed)
Patient ID: [name], female   DOB: 08-Oct-1955, 65 y.o.   MRN: [id number]      PCP: Dr. Forde Dandy  HPI: [name] is a 65 y.o. female who presents to the office for a 29 month cardiology evaluation.   [name] has known coronary artery disease and in 2007 underwent insertion of a 3.0x13 mm Cypher DES stent to her proximal RCA. She did have concomitant CAD with 60-70% stenosis of her LAD and 20% circumflex stenosis. In July 2010 she develop recurrent chest pain and repeat catheterization showed 60% LAD stenosis, 20-30% circumflex stenosis and a widely patent RCA stent.  She has a history of hypertension, hyperlipidemia, hypothyroidism, diabetes mellitus, asthma, GERD, as well as intermittent leg swelling.  When I saw her in December, 2014, she had experienced some episodes of chest pain, which sounded musculoskeletal.  However, with her cardiac risk factor profile, and known CAD .  I recommended a nuclear stress test, but she did not have that done.  I last saw her in September 2015 and ultimately recommended that she undergo the nuclear stress study.  This was interpreted as a high risk study revealing a large moderate intensity reversible distal anterior apical and inferior defect.  As result, she underwent cardiac catheterization in October 2015.  Her catheterization findings were essentially unchanged from previously and showed a widely patent RCA stent.  There was segmental 50% proximal to mid LAD stenoses with an FFR of 0.94.  There were irregularities in the circumflex.  She had normal LV function.  She sees Dr. Forde Dandy and had laboratory in January 2017.  This was notable in that her cholesterol was 191, triglycerides 267, HDL 49, and LDL 89.  Gross was elevated at 190.  Her TSH was 0.11 and free T4 1 0.5.  Subsequent blood work in May 2017 showed a BUN of 24, creatinine 1.19.  Hemoglobin 10.9, hematocrit 34.0.  On 01/06/2016 she underwent an echo Doppler study which showed an  ejection fraction of 60-65% with grade 1 diastolic dysfunction.  There was trivial AR.  When I last saw her, she denied  any episodes of exertional chest tightness, but had rare episodes of chest wall discomfort which improves with aspirin.  She had occasional swelling of her ankles.  She also has a history of intermittent wheezing for which he takes Advair.  Laboratory by Dr. Forde Dandy had revealed significant triglyceride elevation and I recommended lovasa at 2 capsules twice a day.    When I saw her in May 2018 she had experienced occasional episodes of wheezing in the winter months.  She has sleep apnea and is on CPAP.  She denied any episodes of chest pressure.  She was unaware of palpitations.  She went to the emergency room on 11/28/2016 with shortness of breath.  She was given a breathing treatment in the emergency room with moderate relief of her symptoms.  She was felt to have mild asthma with exacerbation.  Laboratory  showed stable renal function with a creatinine 1.13.  She was anemic and is being followed by Dr. Forde Dandy with her hemoglobin of 9.3, hematocrit of 29.8.  BNP was normal at 59.9.   After not seeing her for 25 months, I  saw her on February 04, 2019.  She was without recurrent anginal symptomatology, however she had noticed increasing episodes of shortness of breath over the previous 6 to 7 months.  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  Seen  Dr. Forde Dandy and her hemoglobin A1c was 6.6.  During that evaluation I recommended slight additional titration of her metoprolol succinate to 37.5 mg  which would be helpful both for more optimal blood pressure control as well as improvement in her baseline resting heart rate which was in the upper 80s.  She underwent a follow-up echo Doppler study on April 23, 2019 which showed normal systolic and diastolic parameters.  Ejection fraction was 60 to 65%.  There was mild left ventricular hypertrophy and mild aortic insufficiency.  I  saw her on May 06, 2019.  At that time, she complained of being fatigued and had noticed some mild lightheadedness and some exertional shortness of breath.  She was unaware of any wheezing and denied fever chills or night sweats.  She denied palpitations.  At that time, her blood pressure was low and I recommended she decrease isosorbide from 120 mg down to 60 mg and reduce Verapamil SR from 240 mg down to 120 mg.  I recommended she continue Toprol-XL 37.5 mg but to stagger this and take 12.5 mg in the morning and 25 mg at night.  She has been on levothyroxine 200 mcg for hypothyroidism followed by Dr. Forde Dandy and also was on [facility] for intermittent asthma.  She was on insulin and Victoza for her diabetes.  She was last seen by me in December 2020.  At that time her blood pressure had improved as has her lightheadedness.  An echo Doppler study in September 2020 showed an EF at 60 to 65% with mild LVH.  There was mild aortic insufficiency.  She has continued to use CPAP therapy.  She has had advance home care for her DME company.  Remotely she had been with choice and would like to be switched back to them.    Since I last saw her, she was evaluated by Denise Lofts, PA-C on November 04, 2020.  Previous to that evaluation she was evaluated by Denise Deforest, PA in January 2022 with complaints of some mild chest discomfort.  She was started on Ranexa 500 mg twice a day.  When seen by [name], her chest pain had improved.  She has a history of [name] syndrome.  Presently, she has noticed some mild lightheadedness.  She has been on furosemide and has been taking 40 mg in the morning and 20 mg later in the day, Jardiance 10 mg daily, isosorbide 120 mg, metoprolol succinate 37.5 mg twice a day dosing 500 mg twice daily, Maxide 37/25 mg daily and verapamil 240 mg daily.  She denies any chest tightness.  She has been followed by Dr. Forde Dandy who has been checking her laboratory.  She continues to be on Jardiance  and insulin for her diabetes and levothyroxine for hypothyroidism.  She presents for evaluation with me.   Past Medical History:  Diagnosis Date   Acute bronchitis    Anemia    hx of anemia   Anginal pain (HCC)    Arthritis    osteo   Asthma    Coronary atherosclerosis    Cough    GERD (gastroesophageal reflux disease)    History of colon polyps    History of migraine    Hyperlipidemia    Hypertension    Obstructive sleep apnea (adult) (pediatric)    NO CPAP    Peripheral neuropathy    Type II or unspecified type diabetes mellitus without mention of complication, not stated as uncontrolled    type 2  Unspecified hypothyroidism     Past Surgical History:  Procedure Laterality Date   ANTERIOR FUSION CERVICAL SPINE     CARPAL TUNNEL RELEASE Left    COLONOSCOPY     CORONARY ANGIOPLASTY     LEFT HEART CATHETERIZATION WITH CORONARY ANGIOGRAM N/A 05/29/2014   Procedure: LEFT HEART CATHETERIZATION WITH CORONARY ANGIOGRAM;  Surgeon: Sinclair Grooms, MD;  Location: [facility] CATH LAB;  Service: Cardiovascular;  Laterality: N/A;   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   Procedure: TOTAL KNEE ARTHROPLASTY;  Surgeon: Susa Day, MD;  Location: [facility] ORS;  Service: Orthopedics;  Laterality: Left;  3 hrs   TUBAL LIGATION     WISDOM TOOTH EXTRACTION      Allergies  Allergen Reactions   Allopurinol Rash    Toxic epidermal necrolysis    Codeine Other (See Comments)    REACTION: hallucinations/"loopy"   Levaquin [Levofloxacin] Nausea And Vomiting   Sulfa Antibiotics Hives   Sulfonamide Derivatives Hives    Current Outpatient Medications  Medication Sig Dispense Refill   albuterol (PROVENTIL HFA) 108 (90 Base) MCG/ACT inhaler Inhale 2 puffs into the lungs every 6 (six) hours as needed for wheezing or shortness of breath. For shortness of breath and wheezing 18 each 2   aspirin 81 MG EC tablet      diazepam (VALIUM) 5 MG tablet Take 5 mg by mouth daily as needed (cramps).       diclofenac (VOLTAREN) 75 MG EC tablet Take 75 mg by mouth 2 (two) times daily.     diclofenac Sodium (VOLTAREN) 1 % GEL Apply 1 application topically 4 (four) times daily.     doxycycline (VIBRA-TABS) 100 MG tablet Take 1 tablet (100 mg total) by mouth 2 (two) times daily. 14 tablet 0   fluticasone (FLONASE) 50 MCG/ACT nasal spray Place 2 sprays into both nostrils daily. (Patient taking differently: Place 2 sprays into both nostrils daily as needed for allergies.) 18.2 mL 11   Fluticasone-Umeclidin-Vilant (TRELEGY ELLIPTA) 200-62.5-25 MCG/INH AEPB Inhale 1 puff into the lungs daily. 1 each 11   insulin glargine (LANTUS) 100 UNIT/ML Solostar Pen Inject 40 Units into the skin at bedtime.      insulin lispro (HUMALOG) 100 UNIT/ML KwikPen Inject 5-10 Units into the skin See admin instructions. Inject 5 units subcutaneously up to three times daily if CBG >200, inject 10 units up to three times daily if CBG >300     ipratropium (ATROVENT) 0.02 % nebulizer solution Inhale into the lungs.     ipratropium-albuterol (DUONEB) 0.5-2.5 (3) MG/3ML SOLN Take 3 mLs by nebulization every 4 (four) hours as needed. 360 mL 11   isosorbide mononitrate (IMDUR) 120 MG 24 hr tablet Take 1 tablet (120 mg total) by mouth daily. 30 tablet 3   JARDIANCE 10 MG TABS tablet Take 10 mg by mouth daily.     Lancets (ONETOUCH DELICA PLUS [id number]) MISC Apply 1 each topically 4 (four) times daily.     levothyroxine (SYNTHROID) 100 MCG tablet Take 150 mcg by mouth daily before breakfast.     liraglutide (VICTOZA) 18 MG/3ML SOPN Inject 1.8 mg into the skin at bedtime.     loratadine (CLARITIN) 10 MG tablet loratadine 10 mg tablet     nitroGLYCERIN (NITROLINGUAL) 0.4 MG/SPRAY spray Place 1 spray under the tongue every 5 (five) minutes x 3 doses as needed for chest pain. 12 g 3   NOVOFINE PLUS PEN NEEDLE 32G X 4 MM MISC Inject into the skin.  nystatin (MYCOSTATIN) 100000 UNIT/ML suspension Take 5 mLs (500,000 Units total) by mouth 4  (four) times daily. 60 mL 0   ONE TOUCH ULTRA TEST test strip Use as directed     potassium chloride SA (KLOR-CON) 20 MEQ tablet potassium chloride ER 20 mEq tablet,extended release     pregabalin (LYRICA) 100 MG capsule Take 100 mg by mouth 2 (two) times daily.      ranolazine (RANEXA) 500 MG 12 hr tablet Take 1 tablet (500 mg total) by mouth 2 (two) times daily. 180 tablet 3   Respiratory Therapy Supplies (FLUTTER) DEVI 1 puff by Does not apply route daily. 1 each 0   rosuvastatin (CRESTOR) 5 MG tablet TAKE ONE TABLET BY MOUTH DAILY 30 tablet 11   triamcinolone (KENALOG) 0.1 % SMARTSIG:2 Topical Every 3 Hours PRN     verapamil (CALAN-SR) 240 MG CR tablet Take 240 mg by mouth daily.     furosemide (LASIX) 40 MG tablet Take 1 tablet (40 mg total) by mouth daily. In the morning 90 tablet 3   metoprolol succinate (TOPROL-XL) 25 MG 24 hr tablet Take 1.5 tablets (37.5 mg total) by mouth in the morning AND 1 tablet (25 mg total) every evening.     montelukast (SINGULAIR) 10 MG tablet Take 1 tablet (10 mg total) by mouth at bedtime. 30 tablet 11   Current Facility-Administered Medications  Medication Dose Route Frequency Provider Last Rate Last Admin   Mepolizumab SOLR 100 mg  100 mg Subcutaneous Q28 days Denise Hy, DO   100 mg at 05/21/20 1016    Social History   Socioeconomic History   Marital status: Widowed    Spouse name: Not on file   Number of children: 0   Years of education: Not on file   Highest education level: Not on file  Occupational History   Occupation: retired  Tobacco Use   Smoking status: Former    Packs/day: 0.50    Years: 25.00    Pack years: 12.50    Types: Cigarettes    Quit date: 08/09/2003    Years since quitting: 17.4   Smokeless tobacco: Never  Vaping Use   Vaping Use: Never used  Substance and Sexual Activity   Alcohol use: No    Alcohol/week: 0.0 standard drinks   Drug use: No   Sexual activity: Not on file  Other Topics Concern   Not on file   Social History Narrative   Not on file   Social Determinants of Health   Financial Resource Strain: Not on file  Food Insecurity: Not on file  Transportation Needs: Not on file  Physical Activity: Not on file  Stress: Not on file  Social Connections: Not on file  Intimate Partner Violence: Not on file   Social she is widowed. She does try to be active. There is no tobacco use. She does drink occasional alcohol.  Family History  Problem Relation Age of Onset   Hypertension Mother    Heart disease Mother    Stroke Mother    Prostate cancer Father    Heart disease Brother    Heart attack Sister    ROS General: Negative; No fevers, chills, or night sweats;  HEENT: Negative; No changes in vision or hearing, sinus congestion, difficulty swallowing Pulmonary: Negative; No cough, wheezing, shortness of breath, hemoptysis Cardiovascular: Negative; No chest pain, presyncope, syncope, palpitations Positive for rare, intermittent leg swelling GI: Positive for GERD; No nausea, vomiting, diarrhea, or abdominal pain GU: Negative;  No dysuria, hematuria, or difficulty voiding Musculoskeletal: Positive for arthritis in her knees and hips; no myalgias, joint pain, or weakness Hematologic/Oncology: Negative; no easy bruising, bleeding Endocrine: Positive for hypothyroidism, and type 2 diabetes mellitus; Neuro: Negative; no changes in balance, headaches Skin: Negative; No rashes or skin lesions Psychiatric: Negative; No behavioral problems, depression Sleep: Positive OSA on CPAP;  No breakthrough snoring, daytime sleepiness, hypersomnolence, bruxism, restless legs, hypnogognic hallucinations, no cataplexy Other comprehensive 14 point system review is negative.   PE BP (!) 72/60 (BP Location: [name] Arm)   Pulse 76   Ht 5' 2"  (1.575 m)   Wt 174 lb 6.4 oz (79.1 kg)   BMI 31.90 kg/m    Repeat blood pressure by me was 100/60 supine, 86/64 sitting, and 78/60 standing.  Wt Readings from  Last 3 Encounters:  01/20/21 174 lb 6.4 oz (79.1 kg)  11/04/20 185 lb 3.2 oz (84 kg)  08/24/20 182 lb 14.4 oz (83 kg)   General: Alert, oriented, no distress.  Skin: normal turgor, no rashes, warm and dry HEENT: Normocephalic, atraumatic. Pupils equal round and reactive to light; sclera anicteric; extraocular muscles intact; Nose without nasal septal hypertrophy Mouth/Parynx benign; Mallinpatti scale 3 Neck: No JVD, no carotid bruits; normal carotid upstroke Lungs: clear to ausculatation and percussion; no wheezing or rales Chest wall: without tenderness to palpitation Heart: PMI not displaced, RRR, s1 s2 normal, 1/6 systolic murmur, no diastolic murmur, no rubs, gallops, thrills, or heaves Abdomen: soft, nontender; no hepatosplenomehaly, BS+; abdominal aorta nontender and not dilated by palpation. Back: no CVA tenderness Pulses 2+ Musculoskeletal: full range of motion, normal strength, no joint deformities Extremities: no clubbing cyanosis or edema, Homan's sign negative  Neurologic: grossly nonfocal; Cranial nerves grossly wnl Psychologic: Normal mood and affect   ECG (independently read by me):  NSR at 76, no ectopy; QTc 452 msec  December 2020 ECG (independently read by me): NSRat 63; nonspecific T wave abnormality   September 2019 ECG (independently read by me): Normal sinus rhythm at 78 bpm.  No ectopy.  Normal intervals.     June 2020 ECG (independently read by me): NSR at 88; normal intervals. Nonspecific T changes.  May 2018 ECG (independently read by me): Sinus rhythm at 68 bpm.  Nonspecific T changes.  Normal intervals  September 2017 ECG (independently read by me): Normal sinus rhythm at 74 bpm with mild sinus arrhythmia.  Mild inferolateral T wave abnormalities.  September 2015 ECG (independently read by me): Normal sinus rhythm at 61 beats per minute.  QTc interval 4-6 Denise.  Nonspecific ST-T changes inferolaterally  December 2014 ECG: Normal sinus rhythm at 62 beats  per minute. Nonspecific inferolateral T wave changes.  LABS: BMP Latest Ref Rng & Units 04/29/2020 04/28/2020 04/27/2020  Glucose 70 - 99 mg/dL 134(H) 138(H) 88  BUN 8 - 23 mg/dL 15 20 33(H)  Creatinine 0.44 - 1.00 mg/dL 0.97 0.99 1.31(H)  BUN/Creat Ratio 12 - 28 - - -  Sodium 135 - 145 mmol/L 139 138 138  Potassium 3.5 - 5.1 mmol/L 4.4 4.4 4.6  Chloride 98 - 111 mmol/L 109 107 105  CO2 22 - 32 mmol/L 20(L) 21(L) 21(L)  Calcium 8.9 - 10.3 mg/dL 8.8(L) 8.7(L) 8.8(L)   Hepatic Function Latest Ref Rng & Units 04/29/2020 04/28/2020 04/26/2020  Total Protein 6.5 - 8.1 g/dL - - 8.6(H)  Albumin 3.5 - 5.0 g/dL 2.9(L) 2.9(L) 3.2(L)  AST 15 - 41 U/L - - 19  ALT 0 - 44 U/L - -  12  Alk Phosphatase 38 - 126 U/L - - 77  Total Bilirubin 0.3 - 1.2 mg/dL - - 0.4  Bilirubin, Direct 0.00 - 0.40 mg/dL - - -   CBC Latest Ref Rng & Units 04/29/2020 04/28/2020 04/27/2020  WBC 4.0 - 10.5 K/uL 4.8 4.8 4.8  Hemoglobin 12.0 - 15.0 g/dL 8.5(L) 8.6(L) 10.3(L)  Hematocrit 36.0 - 46.0 % 29.0(L) 28.8(L) 34.9(L)  Platelets 150 - 400 K/uL 374 375 391   Lab Results  Component Value Date   MCV 76.1 (L) 04/29/2020   MCV 75.8 (L) 04/28/2020   MCV 76.9 (L) 04/27/2020   Lab Results  Component Value Date   TSH 1.830 03/28/2018     Lab Results  Component Value Date   HGBA1C 7.1 (H) 04/27/2020   Lipid Panel     Component Value Date/Time   CHOL 144 03/28/2018 1031   TRIG 207 (H) 03/28/2018 1031   HDL 39 (L) 03/28/2018 1031   CHOLHDL 3.7 03/28/2018 1031   CHOLHDL 2.7 05/30/2014 0400   VLDL 31 05/30/2014 0400   LDLCALC 64 03/28/2018 1031    RADIOLOGY: No results found.  IMPRESSION:  1. Coronary artery disease involving native coronary artery of native heart with angina pectoris ([place])   2. Type 2 diabetes mellitus with complication, with long-term current use of insulin ([place])   3. Orthostatic hypotension   4. Hyperlipidemia with target LDL less than 70   5. History of Stevens-Johnson syndrome ([place])   6.  OSA (obstructive sleep apnea)      ASSESSMENT AND PLAN: Denise. Joswick is a 65 year old female with established CAD and is 15  years status post percutaneous coronary intervention to her proximal RCA in 2007.  She had a 60 - 70% stenosis of her LAD as well as 20-30%. She has experienced recurrent episodes of probable musculoskeletal chest discomfort.  A nuclear stress test following in 2015 was interpreted as high risk; cardiac catheterization did not reveal significant change in her coronary anatomy and her previously placed RCA stent was widely patent.  Her LAD lesion was not physiologically significant by fractional flow reserve.  Due to exertional dyspnea, she underwent a follow-up echo Doppler study in September 2020 which revealed normal systolic and diastolic function with mild LVH and mild aortic insufficiency.  Since I had last seen her in 2020, she had been placed on Ranexa 500 mg twice a day by Denise Deforest, PA for chest pain which did improve her symptomatology.  Presently, she is hypotensive and has orthostatic change.  I reviewed her medical list and I have suggested she continue her Maxide.  Has been taking furosemide 40 mg in the morning and 20 mg in the afternoon and I have recommended she change this to 40 mg daily.  She also has been on metoprolol succinate 37.5 mg twice a day commended she reduce her evening dose down to 25 mg.  She has a history of Stevens-Johnson syndrome.  She states Dr. Forde Dandy had checked laboratory.  It does not appear that she is on statin therapy.  In several weeks I am recommending she undergo a comprehensive metabolic panel and lipid panel with these medication adjustments.  She continues to be on insulin, victoza and Jardiance for her diabetes.  She is on CPAP therapy for OSA.  I will see her in 6 to 8 weeks for follow-up evaluation or sooner as needed.   Troy Sine, MD, [facility]  01/27/2021 2:45 PM

## 2021-01-20 NOTE — Patient Instructions (Signed)
Medication Instructions:  STOP Maxzide (triamterene hydrochlorothiazide).   DECREASE Lasix (furosemide) to '40mg'$  (1 tablet)  daily in the morning.   DECREASE metoprolol to 37.'5mg'$  (1.5 tablets) in the morning and '25mg'$  (1 tablet) in the evening.  *If you need a refill on your cardiac medications before your next appointment, please call your pharmacy*   Lab Work: C[place], Lipid in 4 weeks If you have labs (blood work) drawn today and your tests are completely normal, you will receive your results only by: [place] (if you have MyChart) OR A paper copy in the mail If you have any lab test that is abnormal or we need to change your treatment, we will call you to review the results.   Testing/Procedures: None ordered.    Follow-Up: At [facility], you and your health needs are our priority.  As part of our continuing mission to provide you with exceptional heart care, we have created designated Provider Care Teams.  These Care Teams include your primary Cardiologist (physician) and Advanced Practice Providers (APPs -  Physician Assistants and Nurse Practitioners) who all work together to provide you with the care you need, when you need it.  We recommend signing up for the patient portal called "MyChart".  Sign up information is provided on this After Visit Summary.  MyChart is used to connect with patients for Virtual Visits (Telemedicine).  Patients are able to view lab/test results, encounter notes, upcoming appointments, etc.  Non-urgent messages can be sent to your provider as well.   To learn more about what you can do with MyChart, go to [email or web address].    Your next appointment:   6-8 week(s)  The format for your next appointment:   In Person  Provider:   Shelva Majestic, MD

## 2021-01-23 ENCOUNTER — Other Ambulatory Visit: Payer: Self-pay | Admitting: Critical Care Medicine

## 2021-01-23 DIAGNOSIS — J309 Allergic rhinitis, unspecified: Secondary | ICD-10-CM

## 2021-01-23 DIAGNOSIS — J4551 Severe persistent asthma with (acute) exacerbation: Secondary | ICD-10-CM

## 2021-01-25 ENCOUNTER — Telehealth: Payer: Self-pay | Admitting: Cardiovascular Disease

## 2021-01-25 ENCOUNTER — Encounter: Payer: Self-pay | Admitting: Pulmonary Disease

## 2021-01-25 NOTE — Telephone Encounter (Signed)
Spoke with pt regarding swelling and weight gain. Pt states that she was here for an office visit on Wednesday 01/20/21 and Dr. Claiborne Billings made several changes because at the time of office visit pt's blood pressure was 72/60.  At this office visit pt states that she weighed 173lbs. Pt states that this morning she weighs 183lbs. Pt's blood pressure earlier today was 146/72. Pt took her blood pressure a few hours later 138/60.  Pt states that Dr. Claiborne Billings made multiple medication changes at this appointment. Pt states that due to her fluid and weight gain she would like to resume taking maxzide. Will send to Dr. Claiborne Billings to advise.

## 2021-01-25 NOTE — Telephone Encounter (Signed)
Pt c/o medication issue: 1. Name of Medication: Triamterene  2. How are you currently taking this medication (dosage and times per day)? 1 time a day 3. Are you having a reaction (difficulty breathing--STAT)?  SOB  4. What is your medication issue? Chest tight     Pt c/o swelling: STAT is pt has developed SOB within 24 hours  If swelling, where is the swelling located?  Legs and stomach   How much weight have you gained and in what time span? 7 pounds   Have you gained 3 pounds in a day or 5 pounds in a week? Yes   Do you have a log of your daily weights (if so, list)? Yes   Are you currently taking a fluid pill? Yes  Are you currently SOB? Yes   Have you traveled recently? No

## 2021-01-26 MED ORDER — MONTELUKAST SODIUM 10 MG PO TABS: 10.0000 mg | ORAL_TABLET | Freq: Every day | ORAL | 11 refills | Status: DC

## 2021-01-26 NOTE — Addendum Note (Signed)
Addended by: Dessie Coma on: 01/26/2021 08:51 AM   Modules accepted: Orders

## 2021-01-27 ENCOUNTER — Encounter: Payer: Self-pay | Admitting: Cardiovascular Disease

## 2021-01-28 ENCOUNTER — Other Ambulatory Visit: Payer: Self-pay

## 2021-01-28 MED ORDER — TRIAMTERENE-HCTZ 37.5-25 MG PO TABS: 1.0000 | ORAL_TABLET | Freq: Every day | ORAL | 1 refills | Status: DC

## 2021-01-28 NOTE — Telephone Encounter (Signed)
RX sent to pharmacy  

## 2021-01-28 NOTE — Telephone Encounter (Signed)
Patient is following up, requesting advisement regarding going back on Maxide. Please return call when able.

## 2021-01-28 NOTE — Telephone Encounter (Signed)
Called patient, advised of message from PharmD.  Patient verbalized understanding.  Thankful for call back.

## 2021-01-28 NOTE — Telephone Encounter (Signed)
PharmD can you guys help with this?  Patient is just waiting to know if she should restart.  Thank you!

## 2021-01-28 NOTE — Telephone Encounter (Signed)
Per DR Evette Georges note on June/15/2022   "Presently, she is hypotensive and has orthostatic change.  I reviewed her medical list and I have suggested she continue her Maxide.  Has been taking furosemide 40 mg in the morning and 20 mg in the afternoon and I have recommended she change this to 40 mg daily.  She also has been on metoprolol succinate 37.5 mg twice a day commended she reduce her evening dose down to 25 mg."   Recommendation:  *Resume Maxide 37.5-25 daily  *Keep follow up with Dr. Claiborne Billings as previously scheduled  *Continue to monitor blood pressure and call if blood pressure drops below 90 systolic

## 2021-01-31 ENCOUNTER — Other Ambulatory Visit: Payer: Self-pay | Admitting: Physician Assistant

## 2021-02-01 ENCOUNTER — Other Ambulatory Visit: Payer: Self-pay

## 2021-02-01 ENCOUNTER — Ambulatory Visit (INDEPENDENT_AMBULATORY_CARE_PROVIDER_SITE_OTHER): Payer: Medicare Other | Admitting: *Deleted

## 2021-02-01 VITALS — BP 135/75 | HR 63 | Temp 97.5°F | Resp 20

## 2021-02-01 DIAGNOSIS — J4550 Severe persistent asthma, uncomplicated: Secondary | ICD-10-CM

## 2021-02-01 MED ORDER — FAMOTIDINE IN NACL 20-0.9 MG/50ML-% IV SOLN: 20.0000 mg | Freq: Once | INTRAVENOUS | Status: DC | PRN

## 2021-02-01 MED ORDER — SODIUM CHLORIDE 0.9 % IV SOLN: Freq: Once | INTRAVENOUS | Status: DC | PRN

## 2021-02-01 MED ORDER — DIPHENHYDRAMINE HCL 50 MG/ML IJ SOLN: 50.0000 mg | Freq: Once | INTRAMUSCULAR | Status: DC | PRN

## 2021-02-01 MED ORDER — METHYLPREDNISOLONE SODIUM SUCC 125 MG IJ SOLR: 125.0000 mg | Freq: Once | INTRAMUSCULAR | Status: DC | PRN

## 2021-02-01 MED ORDER — EPINEPHRINE 0.3 MG/0.3ML IJ SOAJ: 0.3000 mg | Freq: Once | INTRAMUSCULAR | Status: DC | PRN

## 2021-02-01 MED ORDER — ALBUTEROL SULFATE HFA 108 (90 BASE) MCG/ACT IN AERS: 2.0000 | INHALATION_SPRAY | Freq: Once | RESPIRATORY_TRACT | Status: DC | PRN

## 2021-02-01 MED ORDER — MEPOLIZUMAB 100 MG/ML ~~LOC~~ SOSY
100.0000 mg | PREFILLED_SYRINGE | Freq: Once | SUBCUTANEOUS | Status: AC
Start: 2021-02-01 — End: 2021-02-01
  Administered 2021-02-01: 100 mg via SUBCUTANEOUS

## 2021-02-01 NOTE — Progress Notes (Signed)
Diagnosis: Asthma  Provider:  Praveen Mannam, MD  Procedure: Injection  Nucala (Mepolizumab), Dose: 100 mg, Site: subcutaneous  Discharge: Condition: Good, Destination: Home . AVS provided to patient.   Performed by:  Embrie Mikkelsen A, RN        

## 2021-02-03 ENCOUNTER — Telehealth: Payer: Self-pay | Admitting: *Deleted

## 2021-02-03 NOTE — Telephone Encounter (Signed)
   [facility] Pre-operative Risk Assessment    Patient [name]  DOB: 28-Feb-1956  MRN: [id number]    Request for surgical clearance:  What type of surgery is being performed? Right total knee arthroplasty    When is this surgery scheduled? TBD   What type of clearance is required (medical clearance vs. Pharmacy clearance to hold med vs. Both)? medical  Are there any medications that need to be held prior to surgery and how long?    Practice name and name of physician performing surgery? [facility] Dr. Tonita Cong   What is the office phone number? [phone]   7.   What is the office fax number? [phone] attn: Sherry  8.   Anesthesia type (None, local, MAC, general) ?    [place] 02/03/2021, 5:16 PM  _________________________________________________________________   (provider comments below)

## 2021-02-04 NOTE — Telephone Encounter (Signed)
   Name: MARGGIE BOTROS  DOB: 01/13/1956  MRN: [id number]   Primary Cardiologist: Shelva Majestic, MD  Chart reviewed as part of pre-operative protocol coverage. Patient was contacted 02/04/2021 in reference to pre-operative risk assessment for pending surgery as outlined below.  Avamarie V Greenstreet was last seen on  01/20/21 by Dr. Claiborne Billings.  Since that day, Cartina V Junious has done well. She has a remote history of CAD with PCI. She can complete more than 4.0 METS (walks 1 mile, climbs stairs).   Therefore, based on ACC/AHA guidelines, the patient would be at acceptable risk for the planned procedure without further cardiovascular testing.   The patient was advised that if she develops new symptoms prior to surgery to contact our office to arrange for a follow-up visit, and she verbalized understanding.  I will route this recommendation to the requesting party via Epic fax function and remove from pre-op pool. Please call with questions.  Tami Lin Francetta Ilg, PA 02/04/2021, 10:35 AM

## 2021-02-11 ENCOUNTER — Ambulatory Visit: Payer: Self-pay | Admitting: Orthopedic Surgery

## 2021-02-11 ENCOUNTER — Other Ambulatory Visit (HOSPITAL_COMMUNITY): Payer: Self-pay

## 2021-02-15 ENCOUNTER — Encounter: Payer: Self-pay | Admitting: Pulmonary Disease

## 2021-02-16 ENCOUNTER — Other Ambulatory Visit: Payer: Self-pay

## 2021-02-16 ENCOUNTER — Encounter (HOSPITAL_COMMUNITY): Payer: Self-pay

## 2021-02-16 ENCOUNTER — Encounter: Payer: Self-pay | Admitting: Pulmonary Disease

## 2021-02-16 ENCOUNTER — Encounter (HOSPITAL_COMMUNITY)
Admission: RE | Admit: 2021-02-16 | Discharge: 2021-02-16 | Disposition: A | Discharge status: Home / Self Care | Payer: Medicare Other | Source: Ambulatory Visit | Attending: Specialist | Admitting: Specialist

## 2021-02-16 DIAGNOSIS — Z01812 Encounter for preprocedural laboratory examination: Secondary | ICD-10-CM | POA: Diagnosis present

## 2021-02-16 DIAGNOSIS — I10 Essential (primary) hypertension: Secondary | ICD-10-CM | POA: Insufficient documentation

## 2021-02-16 DIAGNOSIS — Z7984 Long term (current) use of oral hypoglycemic drugs: Secondary | ICD-10-CM | POA: Insufficient documentation

## 2021-02-16 DIAGNOSIS — I2510 Atherosclerotic heart disease of native coronary artery without angina pectoris: Secondary | ICD-10-CM | POA: Insufficient documentation

## 2021-02-16 DIAGNOSIS — Z87891 Personal history of nicotine dependence: Secondary | ICD-10-CM | POA: Insufficient documentation

## 2021-02-16 DIAGNOSIS — Z7901 Long term (current) use of anticoagulants: Secondary | ICD-10-CM | POA: Insufficient documentation

## 2021-02-16 DIAGNOSIS — G4733 Obstructive sleep apnea (adult) (pediatric): Secondary | ICD-10-CM | POA: Insufficient documentation

## 2021-02-16 DIAGNOSIS — M1711 Unilateral primary osteoarthritis, right knee: Secondary | ICD-10-CM | POA: Diagnosis not present

## 2021-02-16 DIAGNOSIS — J45909 Unspecified asthma, uncomplicated: Secondary | ICD-10-CM | POA: Diagnosis not present

## 2021-02-16 DIAGNOSIS — Z79899 Other long term (current) drug therapy: Secondary | ICD-10-CM | POA: Insufficient documentation

## 2021-02-16 DIAGNOSIS — Z794 Long term (current) use of insulin: Secondary | ICD-10-CM | POA: Insufficient documentation

## 2021-02-16 LAB — BASIC METABOLIC PANEL
Anion gap: 10 (ref 5–15)
BUN: 31 mg/dL — ABNORMAL HIGH (ref 8–23)
CO2: 28 mmol/L (ref 22–32)
Calcium: 8.8 mg/dL — ABNORMAL LOW (ref 8.9–10.3)
Chloride: 104 mmol/L (ref 98–111)
Creatinine, Ser: 2.18 mg/dL — ABNORMAL HIGH (ref 0.44–1.00)
GFR, Estimated: 25 mL/min — ABNORMAL LOW (ref >60)
Glucose, Bld: 105 mg/dL — ABNORMAL HIGH (ref 70–99)
Potassium: 4.3 mmol/L (ref 3.5–5.1)
Sodium: 142 mmol/L (ref 135–145)

## 2021-02-16 LAB — URINALYSIS, ROUTINE W REFLEX MICROSCOPIC
Bacteria, UA: NONE SEEN
Bilirubin Urine: NEGATIVE
Glucose, UA: >=500 mg/dL — AB
Hgb urine dipstick: NEGATIVE
Ketones, ur: NEGATIVE mg/dL
Leukocytes,Ua: NEGATIVE
Nitrite: NEGATIVE
Protein, ur: NEGATIVE mg/dL
Specific Gravity, Urine: 1.008 (ref 1.005–1.030)
pH: 5 (ref 5.0–8.0)

## 2021-02-16 LAB — CBC
HCT: 38 % (ref 36.0–46.0)
Hemoglobin: 11.5 g/dL — ABNORMAL LOW (ref 12.0–15.0)
MCH: 25 pg — ABNORMAL LOW (ref 26.0–34.0)
MCHC: 30.3 g/dL (ref 30.0–36.0)
MCV: 82.6 fL (ref 80.0–100.0)
Platelets: 348 10*3/uL (ref 150–400)
RBC: 4.6 MIL/uL (ref 3.87–5.11)
RDW: 17.2 % — ABNORMAL HIGH (ref 11.5–15.5)
WBC: 7.5 10*3/uL (ref 4.0–10.5)
nRBC: 0 % (ref 0.0–0.2)

## 2021-02-16 LAB — SURGICAL PCR SCREEN
MRSA, PCR: NEGATIVE
Staphylococcus aureus: NEGATIVE

## 2021-02-16 LAB — HEMOGLOBIN A1C
Hgb A1c MFr Bld: 8.2 % — ABNORMAL HIGH (ref 4.8–5.6)
Mean Plasma Glucose: 188.64 mg/dL

## 2021-02-16 LAB — GLUCOSE, CAPILLARY: Glucose-Capillary: 116 mg/dL — ABNORMAL HIGH (ref 70–99)

## 2021-02-16 NOTE — Progress Notes (Signed)
COVID Vaccine Completed:Yes Date COVID Vaccine completed:10/25/19-booster June 2022 COVID vaccine manufacturer: Pfizer      PCP - Dr. Chauncey Cruel. Forde Dandy LOV pt thinks it was 01/15/21 Cardiologist - Dr. Corky Downs LOV 01/20/21 Pulmonologist - Dr. Reginia Naas 01/11/21  Chest x-ray - no EKG - 01/20/21-epic Stress Test - no ECHO - 04/23/19 -epic Cardiac Cath - with stent 2007, 05/29/14 -epic Pacemaker/ICD device last checked:NA  Sleep Study - yes CPAP - no  Fasting Blood Sugar - 80-130 Checks Blood Sugar _____ times a day1-2 times a day  Blood Thinner Instructions:NA Aspirin Instructions: Last Dose:  Anesthesia review: yes  Patient denies shortness of breath, fever, cough and chest pain at PAT appointment Pt is able to clime her stairs at home easily without SOB. Her cardiologist is lowering her BP medications because her BP is trending low. She is asymptomatic with it.  Patient verbalized understanding of instructions that were given to them at the PAT appointment. Patient was also instructed that they will need to review over the PAT instructions again at home before surgery. Yes

## 2021-02-16 NOTE — Patient Instructions (Addendum)
DUE TO COVID-19 ONLY ONE VISITOR IS ALLOWED TO COME WITH YOU AND STAY IN THE WAITING ROOM ONLY DURING PRE OP AND PROCEDURE DAY OF SURGERY. THE 1 VISITOR  MAY VISIT WITH YOU AFTER SURGERY IN YOUR PRIVATE ROOM DURING VISITING HOURS ONLY!  YOU NEED TO HAVE A COVID 19 TEST ON_7/13______ '@_9'$ :25______, THIS TEST MUST BE DONE BEFORE SURGERY,  COVID TESTING SITE [place] [address], IT IS ON THE RIGHT GOING OUT [address] APPROXIMATELY  2 MINUTES PAST [employer] ON THE RIGHT. ONCE YOUR COVID TEST IS COMPLETED,  PLEASE BEGIN THE QUARANTINE INSTRUCTIONS AS OUTLINED IN YOUR HANDOUT.                [name]     Your procedure is scheduled on: 02/19/21   Report to [facility] Main  Entrance   Report to admitting at  9:30 AM     Call this number if you have problems the morning of surgery [place], NO CHEWING GUM [place].   No food after midnight.    You may have clear liquid until 9:00 AM.      CLEAR LIQUID DIET   Foods Allowed                                                                     Foods Excluded  Coffee and tea, regular and decaf                             liquids that you cannot  Plain Jell-O any favor except red or purple                                           see through such as: Fruit ices (not with fruit pulp)                                     milk, soups, orange juice  Iced Popsicles                                    All solid food Carbonated beverages, regular and diet                                    Cranberry, grape and apple juices Sports drinks like Gatorade Lightly seasoned clear broth or consume(fat free) Sugar, honey syrup  At 8:30 drink the G2 drink.   Nothing by mouth after 9:00 am   Take these medicines the morning of surgery with A SIP OF WATER:  Use your inhalers and bring them with you.  Bring your mask and  tubing.  Isosorbide, Ranexa, Metoprolol, Synthroid      How to Manage Your Diabetes Before and After Surgery  Why is  it important to control my blood sugar before and after surgery? Improving blood sugar levels before and after surgery helps healing and can limit problems. A way of improving blood sugar control is eating a healthy diet by:  Eating less sugar and carbohydrates  Increasing activity/exercise  Talking with your doctor about reaching your blood sugar goals High blood sugars (greater than 180 mg/dL) can raise your risk of infections and slow your recovery, so you will need to focus on controlling your diabetes during the weeks before surgery. Make sure that the doctor who takes care of your diabetes knows about your planned surgery including the date and location.  How do I manage my blood sugar before surgery? Check your blood sugar at least 4 times a day, starting 2 days before surgery, to make sure that the level is not too high or low. Check your blood sugar the morning of your surgery when you wake up and every 2 hours until you get to the Short Stay unit. If your blood sugar is less than 70 mg/dL, you will need to treat for low blood sugar: Do not take insulin. Treat a low blood sugar (less than 70 mg/dL) with  cup of clear juice (cranberry or apple), 4 glucose tablets, OR glucose gel. Recheck blood sugar in 15 minutes after treatment (to make sure it is greater than 70 mg/dL). If your blood sugar is not greater than 70 mg/dL on recheck, call [phone] for further instructions. Report your blood sugar to the short stay nurse when you get to Short Stay.  If you are admitted to the hospital after surgery: Your blood sugar will be checked by the staff and you will probably be given insulin after surgery (instead of oral diabetes medicines) to make sure you have good blood sugar levels. The goal for blood sugar control after surgery is 80-180 mg/dL.   WHAT DO I DO ABOUT  MY DIABETES MEDICATION? Do not take jardiance the day before surgery  Do not take oral diabetes medicines (pills) the morning of surgery.  THE NIGHT BEFORE SURGERY, take  15   units of  Lantus     insulin.        The day of surgery, do not take other diabetes injectables, including Byetta (exenatide), Bydureon (exenatide ER), Victoza (liraglutide), or Trulicity (dulaglutide).  If your CBG is greater than 220 mg/dL, you may take  of your sliding scale  (correction) dose of insulin.                              You may not have any metal on your body including hair pins and              piercings  Do not wear jewelry, make-up, lotions, powders or perfumes, deodorant             Do not wear nail polish on your fingernails.  Do not shave  48 hours prior to surgery.              Do not bring valuables to the hospital. [place].  Contacts, dentures or bridgework may not be worn into surgery.                 [place] - Preparing for Surgery Before surgery, you can play an important role.  Because skin is not sterile, your skin needs to be as free of germs as possible.  You can reduce the number of germs on your skin by washing with CHG (chlorahexidine gluconate) soap before surgery.  CHG is an antiseptic cleaner which kills germs and bonds with the skin to continue killing germs even after washing. Please DO NOT use if you have an allergy to CHG or antibacterial soaps.  If your skin becomes reddened/irritated stop using the CHG and inform your nurse when you arrive at Short Stay. Do not shave (including legs and underarms) for at least 48 hours prior to the first CHG shower.  Please follow these instructions carefully:  1.  Shower with CHG Soap the night before surgery and the  morning of Surgery.  2.  If you choose to wash your hair, wash your hair first as usual with your  normal  shampoo.  3.  After you shampoo, rinse your hair and  body thoroughly to remove the  shampoo.                                       4.  Use CHG as you would any other liquid soap.  You can apply chg directly  to the skin and wash                       Gently with a scrungie or clean washcloth.  5.  Apply the CHG Soap to your body ONLY FROM THE NECK DOWN.   Do not use on face/ open                           Wound or open sores. Avoid contact with eyes, ears mouth and genitals (private parts).                       Wash face,  Genitals (private parts) with your normal soap.             6.  Wash thoroughly, paying special attention to the area where your surgery  will be performed.  7.  Thoroughly rinse your body with warm water from the neck down.  8.  DO NOT shower/wash with your normal soap after using and rinsing off  the CHG Soap.             9.  Pat yourself dry with a clean towel.            10.  Wear clean pajamas.            11.  Place clean sheets on your bed the night of your first shower and do not  sleep with pets. Day of Surgery : Do not apply any lotions/deodorants the morning of surgery.  Please wear clean clothes to the hospital/surgery center.  FAILURE TO FOLLOW THESE INSTRUCTIONS MAY RESULT IN THE CANCELLATION OF YOUR SURGERY PATIENT SIGNATURE_________________________________  NURSE SIGNATURE__________________________________  ________________________________________________________________________   Adam Phenix  An incentive spirometer is a tool that can help keep your lungs clear and active. This tool measures how well you are filling your lungs with each breath. Taking long deep breaths may help reverse or decrease the chance of developing breathing (pulmonary) problems (especially infection) following: A long period of time when you are unable to move or be active. BEFORE THE PROCEDURE  If the spirometer includes an indicator to show your best effort, your nurse or respiratory therapist will set it to a desired  goal. If possible, sit up straight or lean slightly forward. Try not to slouch. Hold the incentive spirometer in an upright position. INSTRUCTIONS FOR USE  Sit on the edge of your bed if possible, or sit up as far as you can in bed or on a chair. Hold the incentive spirometer in an upright position. Breathe out normally. Place the mouthpiece in your mouth and seal your lips tightly around it. Breathe in slowly and as deeply as possible, raising the piston or the ball toward the top of the column. Hold your breath for 3-5 seconds or for as long as possible. Allow the piston or ball to fall to the bottom of the column. Remove the mouthpiece from your mouth and breathe out normally. Rest for a few seconds and repeat Steps 1 through 7 at least 10 times every 1-2 hours when you are awake. Take your time and take a few normal breaths between deep breaths. The spirometer may include an indicator to show your best effort. Use the indicator as a goal to work toward during each repetition. After each set of 10 deep breaths, practice coughing to be sure your lungs are clear. If you have an incision (the cut made at the time of surgery), support your incision when coughing by placing a pillow or rolled up towels firmly against it. Once you are able to get out of bed, walk around indoors and cough well. You may stop using the incentive spirometer when instructed by your caregiver.  RISKS AND COMPLICATIONS Take your time so you do not get dizzy or light-headed. If you are in pain, you may need to take or ask for pain medication before doing incentive spirometry. It is harder to take a deep breath if you are having pain. AFTER USE Rest and breathe slowly and easily. It can be helpful to keep track of a log of your progress. Your caregiver can provide you with a simple table to help with this. If you are using the spirometer at home, follow these instructions: [place] IF:  You are having difficultly  using the spirometer. You have trouble using the spirometer as often as instructed. Your pain medication is not giving enough relief while using the spirometer. You develop fever of 100.5 F (38.1 C) or higher. SEEK IMMEDIATE MEDICAL CARE IF:  You cough up bloody sputum that had not been present before. You develop fever of 102 F (38.9 C) or greater. You develop worsening pain at or near the incision site. MAKE SURE YOU:  Understand these instructions. Will watch your condition. Will get help right away if you are not doing well or get worse. Document Released: 12/05/2006 Document Revised: 10/17/2011 Document Reviewed: 02/05/2007 [facility] Patient Information 2014 [place], [place].   ________________________________________________________________________

## 2021-02-17 ENCOUNTER — Encounter (HOSPITAL_COMMUNITY): Payer: Self-pay | Admitting: Physician Assistant

## 2021-02-17 ENCOUNTER — Encounter (HOSPITAL_COMMUNITY): Payer: Self-pay | Admitting: Certified Registered Nurse Anesthetist

## 2021-02-17 ENCOUNTER — Other Ambulatory Visit (HOSPITAL_COMMUNITY)
Admission: RE | Admit: 2021-02-17 | Discharge: 2021-02-17 | Disposition: A | Discharge status: Home / Self Care | Payer: Medicare Other | Source: Ambulatory Visit | Attending: Specialist | Admitting: Specialist

## 2021-02-17 DIAGNOSIS — Z01812 Encounter for preprocedural laboratory examination: Secondary | ICD-10-CM | POA: Insufficient documentation

## 2021-02-17 DIAGNOSIS — Z20822 Contact with and (suspected) exposure to covid-19: Secondary | ICD-10-CM | POA: Diagnosis not present

## 2021-02-17 LAB — SARS CORONAVIRUS 2 (TAT 6-24 HRS): SARS Coronavirus 2: NEGATIVE

## 2021-02-17 LAB — COMPREHENSIVE METABOLIC PANEL
ALT: 10 IU/L (ref 0–32)
AST: 13 IU/L (ref 0–40)
Albumin/Globulin Ratio: 1.1 — ABNORMAL LOW (ref 1.2–2.2)
Albumin: 4 g/dL (ref 3.8–4.8)
Alkaline Phosphatase: 109 IU/L (ref 44–121)
BUN/Creatinine Ratio: 15 (ref 12–28)
BUN: 29 mg/dL — ABNORMAL HIGH (ref 8–27)
Bilirubin Total: 0.3 mg/dL (ref 0.0–1.2)
CO2: 25 mmol/L (ref 20–29)
Calcium: 9.1 mg/dL (ref 8.7–10.3)
Chloride: 101 mmol/L (ref 96–106)
Creatinine, Ser: 1.9 mg/dL — ABNORMAL HIGH (ref 0.57–1.00)
Globulin, Total: 3.5 g/dL (ref 1.5–4.5)
Glucose: 66 mg/dL (ref 65–99)
Potassium: 4.3 mmol/L (ref 3.5–5.2)
Sodium: 141 mmol/L (ref 134–144)
Total Protein: 7.5 g/dL (ref 6.0–8.5)
eGFR: 29 mL/min/{1.73_m2} — ABNORMAL LOW (ref >59)

## 2021-02-17 LAB — LIPID PANEL
Chol/HDL Ratio: 4.3 ratio (ref 0.0–4.4)
Cholesterol, Total: 186 mg/dL (ref 100–199)
HDL: 43 mg/dL (ref >39)
LDL Chol Calc (NIH): 102 mg/dL — ABNORMAL HIGH (ref 0–99)
Triglycerides: 239 mg/dL — ABNORMAL HIGH (ref 0–149)
VLDL Cholesterol Cal: 41 mg/dL — ABNORMAL HIGH (ref 5–40)

## 2021-02-17 NOTE — Progress Notes (Addendum)
Anesthesia Chart Review   Case: [id number] Date/Time: 02/19/21 1145   Procedure: TOTAL KNEE ARTHROPLASTY (Right: Knee)   Anesthesia type: Spinal   Pre-op diagnosis: Right knee degenerative joint disease   Location: [facility]OM 05 / [facility]   Surgeons: Susa Day, MD       DISCUSSION:65 y.o. former smoker with h/o HTN, GERD, asthma, OSA, DM II, CAD (DES 2007), right knee OA scheduled for above procedure 02/19/2021 with Dr. Susa Day.   Elevated A1C of 8.2. Surgeon made aware.   Creatinine 2.18.  No known kidney disease. Needs evaluation by PCP. Labs forwarded to PCP, discussed with surgeon's office.  Discussed with Dr. Doroteo Glassman.   Cardiology preoperative evaluation 02/04/2021, "Chart reviewed as part of pre-operative protocol coverage. Patient was contacted 02/04/2021 in reference to pre-operative risk assessment for pending surgery as outlined below.  [name] was last seen on  01/20/21 by Dr. Claiborne Billings.  Since that day, [name] has done well. She has a remote history of CAD with PCI. She can complete more than 4.0 METS (walks 1 mile, climbs stairs).    Therefore, based on ACC/AHA guidelines, the patient would be at acceptable risk for the planned procedure without further cardiovascular testing."   VS: BP (!) 95/53   Pulse 78   Temp 36.7 C (Oral)   Resp 20   Ht '5\' 2"'$  (1.575 m)   Wt 81.2 kg   SpO2 97%   BMI 32.74 kg/m   PROVIDERS: Reynold Bowen, MD is PCP    LABS:  labs forwarded to surgeon and PCP (all labs ordered are listed, but only abnormal results are displayed)  Labs Reviewed  CBC - Abnormal; Notable for the following components:      Result Value   Hemoglobin 11.5 (*)    MCH 25.0 (*)    RDW 17.2 (*)    All other components within normal limits  BASIC METABOLIC PANEL - Abnormal; Notable for the following components:   Glucose, Bld 105 (*)    BUN 31 (*)    Creatinine, Ser 2.18 (*)    Calcium 8.8 (*)    GFR, Estimated 25 (*)    All other  components within normal limits  URINALYSIS, ROUTINE W REFLEX MICROSCOPIC - Abnormal; Notable for the following components:   Glucose, UA >=500 (*)    All other components within normal limits  GLUCOSE, CAPILLARY - Abnormal; Notable for the following components:   Glucose-Capillary 116 (*)    All other components within normal limits  SURGICAL PCR SCREEN     IMAGES:   EKG: 01/20/2021 Rate 76 bpm  NSR  CV: Echo 04/23/2019   1. The left ventricle has normal systolic function with an ejection  fraction of 60-65%. The cavity size was normal. There is mildly increased  left ventricular wall thickness. Left ventricular diastolic parameters  were normal.   2. The right ventricle has normal systolic function. The cavity was  normal.   3. The tricuspid valve is grossly normal.   4. The aortic valve has an indeterminate number of cusps. Aortic valve  regurgitation is mild by color flow Doppler. No stenosis of the aortic  valve.   5. The aorta is normal unless otherwise noted.   6. Normal LV function; mild LVH; mild AI.  Past Medical History:  Diagnosis Date   Acute bronchitis    Anemia    hx of anemia   Anginal pain (HCC)    Arthritis    osteo  Asthma    Coronary atherosclerosis    Cough    GERD (gastroesophageal reflux disease)    History of colon polyps    History of migraine    Hyperlipidemia    Hypertension    Obstructive sleep apnea (adult) (pediatric)    NO CPAP    Peripheral neuropathy    Type II or unspecified type diabetes mellitus without mention of complication, not stated as uncontrolled    type 2   Unspecified hypothyroidism     Past Surgical History:  Procedure Laterality Date   ANTERIOR FUSION CERVICAL SPINE     CARPAL TUNNEL RELEASE Left    COLONOSCOPY     CORONARY ANGIOPLASTY     LEFT HEART CATHETERIZATION WITH CORONARY ANGIOGRAM N/A 05/29/2014   Procedure: LEFT HEART CATHETERIZATION WITH CORONARY ANGIOGRAM;  Surgeon: Sinclair Grooms, MD;   [facility] C[facility] LAB;  Service: Cardiovascular;  Laterality: N/A;   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   Procedure: TOTAL KNEE ARTHROPLASTY;  Surgeon: Susa Day, MD;  Location: [facility];  Service: Orthopedics;  Laterality: Left;  3 hrs   TUBAL LIGATION     WISDOM TOOTH EXTRACTION      MEDICATIONS:  albuterol (PROVENTIL HFA) 108 (90 Base) MCG/ACT inhaler   BIOTIN PO   clobetasol (TEMOVATE) 0.05 % GEL   Cyanocobalamin (B-12 PO)   diazepam (VALIUM) 5 MG tablet   diclofenac (VOLTAREN) 75 MG EC tablet   doxycycline (VIBRA-TABS) 100 MG tablet   ferrous sulfate 325 (65 FE) MG tablet   fluconazole (DIFLUCAN) 150 MG tablet   fluticasone (FLONASE) 50 MCG/ACT nasal spray   Fluticasone-Umeclidin-Vilant (TRELEGY ELLIPTA) 200-62.5-25 MCG/INH AEPB   furosemide (LASIX) 40 MG tablet   insulin glargine (LANTUS) 100 UNIT/ML Solostar Pen   insulin lispro (HUMALOG) 100 UNIT/ML KwikPen   ipratropium-albuterol (DUONEB) 0.5-2.5 (3) MG/3ML SOLN   isosorbide mononitrate (IMDUR) 120 MG 24 hr tablet   JARDIANCE 10 MG TABS tablet   Lancets (ONETOUCH DELICA PLUS 123XX123) MISC   levothyroxine (SYNTHROID) 150 MCG tablet   liraglutide (VICTOZA) 18 MG/3ML SOPN   metoprolol succinate (TOPROL-XL) 25 MG 24 hr tablet   montelukast (SINGULAIR) 10 MG tablet   Multiple Vitamin (MULTIVITAMIN WITH MINERALS) TABS tablet   nitroGLYCERIN (NITROLINGUAL) 0.4 MG/SPRAY spray   NOVOFINE PLUS PEN NEEDLE 32G X 4 MM MISC   nystatin (MYCOSTATIN) 100000 UNIT/ML suspension   ONE TOUCH ULTRA TEST test strip   Polyethyl Glycol-Propyl Glycol (SYSTANE OP)   potassium chloride SA (KLOR-CON) 20 MEQ tablet   pregabalin (LYRICA) 100 MG capsule   Pyridoxine HCl (B-6 PO)   ranolazine (RANEXA) 500 MG 12 hr tablet   Respiratory Therapy Supplies (FLUTTER) DEVI   rosuvastatin (CRESTOR) 5 MG tablet   tacrolimus (PROGRAF) 1 MG capsule   traMADol (ULTRAM) 50 MG tablet   triamterene-hydrochlorothiazide (MAXZIDE-25) 37.5-25 MG tablet    verapamil (CALAN-SR) 120 MG CR tablet    Mepolizumab SOLR 100 mg    Konrad Felix, PA-C [facility] [phone]

## 2021-02-18 ENCOUNTER — Telehealth: Payer: Self-pay | Admitting: Cardiovascular Disease

## 2021-02-18 NOTE — Telephone Encounter (Signed)
Returned call to patient who was calling to get her lab results. Advised patient that once labs results were reviewed by Dr. Claiborne Billings, someone will reach out to her to review those. Patient verbalized understanding.

## 2021-02-18 NOTE — Telephone Encounter (Signed)
Pt is calling in regards to her lab results from 02/17/21. Please advise pt further

## 2021-02-19 ENCOUNTER — Inpatient Hospital Stay (HOSPITAL_COMMUNITY): Admission: RE | Admit: 2021-02-19 | Payer: Medicare Other | Source: Home / Self Care | Admitting: Specialist

## 2021-02-19 ENCOUNTER — Encounter (HOSPITAL_COMMUNITY): Payer: Self-pay | Admitting: Physician Assistant

## 2021-02-19 SURGERY — ARTHROPLASTY, KNEE, TOTAL
Anesthesia: Spinal | Site: Knee | Laterality: Right

## 2021-02-22 ENCOUNTER — Other Ambulatory Visit (HOSPITAL_COMMUNITY): Payer: Self-pay

## 2021-02-23 ENCOUNTER — Telehealth: Payer: Self-pay | Admitting: Pharmacy Technician

## 2021-02-23 NOTE — Telephone Encounter (Signed)
(  FYI) Spoke with patient in regards of formulation change with nucala. (Vials). Informed patient to call clinic 40-45 min prior to all future appt so we can began compounding medication.

## 2021-02-24 ENCOUNTER — Ambulatory Visit: Payer: Medicare Other | Admitting: Physical Therapy

## 2021-03-01 ENCOUNTER — Ambulatory Visit (INDEPENDENT_AMBULATORY_CARE_PROVIDER_SITE_OTHER): Payer: Medicare Other

## 2021-03-01 ENCOUNTER — Other Ambulatory Visit: Payer: Self-pay

## 2021-03-01 VITALS — BP 113/66 | HR 75 | Temp 96.2°F | Resp 20

## 2021-03-01 DIAGNOSIS — J4550 Severe persistent asthma, uncomplicated: Secondary | ICD-10-CM

## 2021-03-01 MED ORDER — MEPOLIZUMAB 100 MG ~~LOC~~ SOLR
100.0000 mg | Freq: Once | SUBCUTANEOUS | Status: AC
Start: 2021-03-01 — End: 2021-03-01
  Administered 2021-03-01: 100 mg via SUBCUTANEOUS
  Filled 2021-03-01: qty 1

## 2021-03-01 MED ORDER — METHYLPREDNISOLONE SODIUM SUCC 125 MG IJ SOLR: 125.0000 mg | Freq: Once | INTRAMUSCULAR | Status: DC | PRN

## 2021-03-01 MED ORDER — DIPHENHYDRAMINE HCL 50 MG/ML IJ SOLN: 50.0000 mg | Freq: Once | INTRAMUSCULAR | Status: DC | PRN

## 2021-03-01 MED ORDER — EPINEPHRINE 0.3 MG/0.3ML IJ SOAJ: 0.3000 mg | Freq: Once | INTRAMUSCULAR | Status: DC | PRN

## 2021-03-01 MED ORDER — FAMOTIDINE IN NACL 20-0.9 MG/50ML-% IV SOLN: 20.0000 mg | Freq: Once | INTRAVENOUS | Status: DC | PRN

## 2021-03-01 MED ORDER — ALBUTEROL SULFATE HFA 108 (90 BASE) MCG/ACT IN AERS: 2.0000 | INHALATION_SPRAY | Freq: Once | RESPIRATORY_TRACT | Status: DC | PRN

## 2021-03-01 MED ORDER — SODIUM CHLORIDE 0.9 % IV SOLN: Freq: Once | INTRAVENOUS | Status: DC | PRN

## 2021-03-01 NOTE — Progress Notes (Signed)
Diagnosis: Asthma  Provider:  Marshell Garfinkel, MD  Procedure: Injection  Nucala (Mepolizumab), Dose: 100 mg, Site: subcutaneous lt arm  Discharge: Condition: Good, Destination: Home . AVS provided to patient.   Performed by:  Batu Cassin, Sherlon Handing, LPN

## 2021-03-03 ENCOUNTER — Other Ambulatory Visit: Payer: Self-pay

## 2021-03-03 MED ORDER — ROSUVASTATIN CALCIUM 10 MG PO TABS: 10.0000 mg | ORAL_TABLET | Freq: Every day | ORAL | 3 refills | Status: DC

## 2021-03-09 ENCOUNTER — Other Ambulatory Visit (HOSPITAL_COMMUNITY): Payer: Self-pay

## 2021-03-09 ENCOUNTER — Telehealth: Payer: Self-pay | Admitting: Pharmacy Technician

## 2021-03-09 ENCOUNTER — Encounter: Payer: Self-pay | Admitting: Pulmonary Disease

## 2021-03-09 NOTE — Telephone Encounter (Signed)
Auth Submission: NO AUTH REQUIRED - PATIENT TRACKING Payer: [employer] Medication & CPT/J Code(s) submitted: Nucala (Mepolizumab) [id number] Route of submission (phone, fax, portal): PHONE [phone] Auth type: Buy/Bill Units/visits requested: 6  Reference number: [id number]

## 2021-03-10 ENCOUNTER — Other Ambulatory Visit: Payer: Self-pay | Admitting: Pharmacy Technician

## 2021-03-11 ENCOUNTER — Other Ambulatory Visit: Payer: Self-pay

## 2021-03-11 MED ORDER — OMEGA-3-ACID ETHYL ESTERS 1 G PO CAPS: 1.0000 g | ORAL_CAPSULE | Freq: Two times a day (BID) | ORAL | 3 refills | Status: DC

## 2021-03-17 NOTE — Telephone Encounter (Signed)
Called patient, let her know Dr. Claiborne Billings had signed her form for the Senior [employer] program. Pt reports she will pick up the form when she comes in for her appointment 8/12. All questions/concerns addressed at this time.

## 2021-03-19 ENCOUNTER — Other Ambulatory Visit (HOSPITAL_COMMUNITY): Payer: Self-pay

## 2021-03-19 ENCOUNTER — Encounter: Payer: Self-pay | Admitting: Cardiovascular Disease

## 2021-03-19 ENCOUNTER — Telehealth: Payer: Self-pay | Admitting: Pulmonary Disease

## 2021-03-19 ENCOUNTER — Other Ambulatory Visit: Payer: Self-pay

## 2021-03-19 ENCOUNTER — Ambulatory Visit (INDEPENDENT_AMBULATORY_CARE_PROVIDER_SITE_OTHER): Payer: Medicare Other | Admitting: Cardiovascular Disease

## 2021-03-19 ENCOUNTER — Encounter: Payer: Self-pay | Admitting: Pulmonary Disease

## 2021-03-19 VITALS — BP 110/66 | HR 66 | Resp 20 | Ht 62.0 in | Wt 182.0 lb

## 2021-03-19 DIAGNOSIS — J4551 Severe persistent asthma with (acute) exacerbation: Secondary | ICD-10-CM

## 2021-03-19 DIAGNOSIS — Z794 Long term (current) use of insulin: Secondary | ICD-10-CM

## 2021-03-19 DIAGNOSIS — E118 Type 2 diabetes mellitus with unspecified complications: Secondary | ICD-10-CM | POA: Diagnosis not present

## 2021-03-19 DIAGNOSIS — E785 Hyperlipidemia, unspecified: Secondary | ICD-10-CM | POA: Diagnosis not present

## 2021-03-19 DIAGNOSIS — L511 Stevens-Johnson syndrome: Secondary | ICD-10-CM

## 2021-03-19 DIAGNOSIS — I2510 Atherosclerotic heart disease of native coronary artery without angina pectoris: Secondary | ICD-10-CM

## 2021-03-19 DIAGNOSIS — N184 Chronic kidney disease, stage 4 (severe): Secondary | ICD-10-CM

## 2021-03-19 DIAGNOSIS — I10 Essential (primary) hypertension: Secondary | ICD-10-CM

## 2021-03-19 DIAGNOSIS — G4733 Obstructive sleep apnea (adult) (pediatric): Secondary | ICD-10-CM

## 2021-03-19 MED ORDER — ISOSORBIDE MONONITRATE ER 60 MG PO TB24: 60.0000 mg | ORAL_TABLET | Freq: Every day | ORAL | 3 refills | Status: DC

## 2021-03-19 MED ORDER — ROSUVASTATIN CALCIUM 20 MG PO TABS: 20.0000 mg | ORAL_TABLET | Freq: Every day | ORAL | 3 refills | Status: DC

## 2021-03-19 NOTE — Telephone Encounter (Signed)
Patient states that injection site was red, itchy, and she had wheezing after Nucala injection that came from vial. She states she could not see hte injection site since it was in the back of her arm but states she probably scratched it enough to cause a scab.   She has been advised to pre-medicate with loratidine the day before next dose on 03/29/21 and the morning of dose. I've advised that she should ice the area if its itchy or red and that she may use OTC hydrocortisone cream as well to help with itchiness. She will plan to do this for upcoming dose.  She is interested in self-administration but states she did not qualify for Nucala PAP since her income exceeds maximum. She has not had any change in income. We will try to re-apply since she has new insurance through [facility] which required her to change to vial formulation anyways. We will mail her application and she will plan to complete and bring back to the clinic at her [facility] appointment  Provider portion placed in Dr. Judson Roch mailbox  Knox Saliva, PharmD, MPH, BCPS Clinical Pharmacist (Rheumatology and Pulmonology)

## 2021-03-19 NOTE — Telephone Encounter (Signed)
Pt stated that her formula has been changed for her Nucala shot and stated that she had a bad reaction to it last time and caused her to have wheezing and she stated she was informed to notify us prior to her arrival so that it may be created correctly. She had further questions in regards to it. Pls regard; [phone].

## 2021-03-19 NOTE — Patient Instructions (Signed)
Medication Instructions:  DECREASE your isosorbide mononitrate (Imdur) to '60mg'$  (1 tablet) daily.  INCREASE rosuvastatin to '20mg'$  (1 tablet) daily.   *If you need a refill on your cardiac medications before your next appointment, please call your pharmacy*   Lab Work: None ordered.    Testing/Procedures: None ordered.    Follow-Up: At [facility], you and your health needs are our priority.  As part of our continuing mission to provide you with exceptional heart care, we have created designated Provider Care Teams.  These Care Teams include your primary Cardiologist (physician) and Advanced Practice Providers (APPs -  Physician Assistants and Nurse Practitioners) who all work together to provide you with the care you need, when you need it.  We recommend signing up for the patient portal called "MyChart".  Sign up information is provided on this After Visit Summary.  MyChart is used to connect with patients for Virtual Visits (Telemedicine).  Patients are able to view lab/test results, encounter notes, upcoming appointments, etc.  Non-urgent messages can be sent to your provider as well.   To learn more about what you can do with MyChart, go to [email or web address].    Your next appointment:   4 month(s)  The format for your next appointment:   In Person  Provider:   Shelva Majestic, MD

## 2021-03-19 NOTE — Telephone Encounter (Signed)
Submitted a Prior Authorization request to [facility] via CoverMyMeds. Will update once we receive a response.  Key: [id number]  Knox Saliva, PharmD, MPH, BCPS Clinical Pharmacist (Rheumatology and Pulmonology)

## 2021-03-19 NOTE — Progress Notes (Signed)
Patient ID: [name], female   DOB: 06-Oct-1955, 65 y.o.   MRN: [id number]      PCP: Dr. Forde [name]  HPI: [name] is a 65 y.o. female who presents to the office for a 2 month cardiology evaluation.   [name] has known coronary artery disease and in 2007 underwent insertion of a 3.0x13 mm Cypher DES stent to her proximal RCA. She did have concomitant CAD with 60-70% stenosis of her LAD and 20% circumflex stenosis. In July 2010 she develop recurrent chest pain and repeat catheterization showed 60% LAD stenosis, 20-30% circumflex stenosis and a widely patent RCA stent.  She has a history of hypertension, hyperlipidemia, hypothyroidism, diabetes mellitus, asthma, GERD, as well as intermittent leg swelling.  When I saw her in December, 2014, she had experienced some episodes of chest pain, which sounded musculoskeletal.  However, with her cardiac risk factor profile, and known CAD .  I recommended a nuclear stress test, but she did not have that done.  I last saw her in September 2015 and ultimately recommended that she undergo the nuclear stress study.  This was interpreted as a high risk study revealing a large moderate intensity reversible distal anterior apical and inferior defect.  As result, she underwent cardiac catheterization in October 2015.  Her catheterization findings were essentially unchanged from previously and showed a widely patent RCA stent.  There was segmental 50% proximal to mid LAD stenoses with an FFR of 0.94.  There were irregularities in the circumflex.  She had normal LV function.  She sees Dr. Forde [name] and had laboratory in January 2017.  This was notable in that her cholesterol was 191, triglycerides 267, HDL 49, and LDL 89.  Gross was elevated at 190.  Her TSH was 0.11 and free T4 1 0.5.  Subsequent blood work in May 2017 showed a BUN of 24, creatinine 1.19.  Hemoglobin 10.9, hematocrit 34.0.  On 01/06/2016 she underwent an echo Doppler study which showed an  ejection fraction of 60-65% with grade 1 diastolic dysfunction.  There was trivial AR.  When I last saw her, she denied  any episodes of exertional chest tightness, but had rare episodes of chest wall discomfort which improves with aspirin.  She had occasional swelling of her ankles.  She also has a history of intermittent wheezing for which he takes Advair.  Laboratory by Dr. Forde [name] had revealed significant triglyceride elevation and I recommended lovasa at 2 capsules twice a day.    When I saw her in May 2018 she had experienced occasional episodes of wheezing in the winter months.  She has sleep apnea and is on CPAP.  She denied any episodes of chest pressure.  She was unaware of palpitations.  She went to the emergency room on 11/28/2016 with shortness of breath.  She was given a breathing treatment in the emergency room with moderate relief of her symptoms.  She was felt to have mild asthma with exacerbation.  Laboratory  showed stable renal function with a creatinine 1.13.  She was anemic and is being followed by Dr. Forde [name] with her hemoglobin of 9.3, hematocrit of 29.8.  BNP was normal at 59.9.   After not seeing her for 25 months, I  saw her on February 04, 2019.  She was without recurrent anginal symptomatology, however she had noticed increasing episodes of shortness of breath over the previous 6 to 7 months.  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  Seen  Dr. Forde [name] and her hemoglobin A1c was 6.6.  During that evaluation I recommended slight additional titration of her metoprolol succinate to 37.5 mg  which would be helpful both for more optimal blood pressure control as well as improvement in her baseline resting heart rate which was in the upper 80s.  She underwent a follow-up echo Doppler study on April 23, 2019 which showed normal systolic and diastolic parameters.  Ejection fraction was 60 to 65%.  There was mild left ventricular hypertrophy and mild aortic insufficiency.  I  saw her on May 06, 2019.  At that time, she complained of being fatigued and had noticed some mild lightheadedness and some exertional shortness of breath.  She was unaware of any wheezing and denied fever chills or night sweats.  She denied palpitations.  At that time, her blood pressure was low and I recommended she decrease isosorbide from 120 mg down to 60 mg and reduce Verapamil SR from 240 mg down to 120 mg.  I recommended she continue Toprol-XL 37.5 mg but to stagger this and take 12.5 mg in the morning and 25 mg at night.  She has been on levothyroxine 200 mcg for hypothyroidism followed by Dr. Forde [name] and also was on [facility] for intermittent asthma.  She was on insulin and Victoza for her diabetes.  She was last seen by me in December 2020.  At that time her blood pressure had improved as has her lightheadedness.  An echo Doppler study in September 2020 showed an EF at 60 to 65% with mild LVH.  There was mild aortic insufficiency.  She has continued to use CPAP therapy.  She has had advance home care for her DME company.  Remotely she had been with choice and would like to be switched back to them.    She was evaluated by Denise Lofts, PA-C on November 04, 2020.  Previous to that evaluation she was evaluated by Denise Deforest, PA in January 2022 with complaints of some mild chest discomfort.  She was started on Ranexa 500 mg twice a day.  When seen by [name], her chest pain had improved.  She has a history of [name] syndrome.  I last saw her on January 20, 2021.  At that time she had noted some mild lightheadedness.  She had been taking  furosemide and has been taking 40 mg in the morning and 20 mg later in the day, Jardiance 10 mg daily, isosorbide 120 mg, metoprolol succinate 37.5 mg twice a day dosing 500 mg twice daily, Maxide 37/25 mg daily and verapamil 120 mg mg daily.  She denied any chest tightness.  She has been followed by Dr. Forde [name] who has been checking her laboratory.  She  continues to be on Jardiance and insulin for her diabetes and levothyroxine for hypothyroidism.  During that evaluation, she was orthostatic with her blood pressure 100/60 supine dropping to 78/60 while standing.  I recommended that since she was taking furosemide 40 mg in the morning and 20 mg in the afternoon that she discontinue her Maxide.  She also had been on metoprolol succinate 37.5 mg twice a day and I recommended she reduce her evening dose down to 25 mg.  Dr. Forde [name] has been checking her laboratory.  Her creatinine was elevated at 1.9 but improved from 2.18.  She underwent subsequent lipid studies and triglycerides were 239, VLDL 41, and LDL at 102.  At the time she was on rosuvastatin 5 mg.  She was contacted  to increase rosuvastatin and also add Lovaza 1 capsule twice a day.  She tells me approximately 5 days after stopping her Maxide she began to notice more lower extremity edema.  She was trying to avoid sodium intake.  Ultimately she resumed taking her Maxide.  She saw Dr. Forde [name] last week for laboratory and has a follow-up office visit later today.  She denies any chest pain PND orthopnea.  Her lightheadedness has improved.  Past Medical History:  Diagnosis Date   Acute bronchitis    Anemia    hx of anemia   Anginal pain (HCC)    Arthritis    osteo   Asthma    Coronary atherosclerosis    Cough    GERD (gastroesophageal reflux disease)    History of colon polyps    History of migraine    Hyperlipidemia    Hypertension    Obstructive sleep apnea (adult) (pediatric)    NO CPAP    Peripheral neuropathy    Type II or unspecified type diabetes mellitus without mention of complication, not stated as uncontrolled    type 2   Unspecified hypothyroidism     Past Surgical History:  Procedure Laterality Date   ANTERIOR FUSION CERVICAL SPINE     CARPAL TUNNEL RELEASE Left    COLONOSCOPY     CORONARY ANGIOPLASTY     LEFT HEART CATHETERIZATION WITH CORONARY ANGIOGRAM N/A 05/29/2014    Procedure: LEFT HEART CATHETERIZATION WITH CORONARY ANGIOGRAM;  Surgeon: Sinclair Grooms, MD;  Location: [facility] CATH LAB;  Service: Cardiovascular;  Laterality: N/A;   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   Procedure: TOTAL KNEE ARTHROPLASTY;  Surgeon: Susa Day, MD;  Location: [facility] ORS;  Service: Orthopedics;  Laterality: Left;  3 hrs   TUBAL LIGATION     WISDOM TOOTH EXTRACTION      Allergies  Allergen Reactions   Allopurinol Rash    Toxic epidermal necrolysis    Codeine Other (See Comments)    REACTION: hallucinations/"loopy"   Levaquin [Levofloxacin] Nausea And Vomiting   Sulfa Antibiotics Hives   Sulfonamide Derivatives Hives    Current Outpatient Medications  Medication Sig Dispense Refill   albuterol (PROVENTIL HFA) 108 (90 Base) MCG/ACT inhaler Inhale 2 puffs into the lungs every 6 (six) hours as needed for wheezing or shortness of breath. For shortness of breath and wheezing 18 each 2   BIOTIN PO Take 500 mg by mouth daily.     clobetasol (TEMOVATE) 0.05 % GEL Apply 1 application topically daily as needed (Hair).     Cyanocobalamin (B-12 PO) Take 1,000 mg by mouth daily.     diazepam (VALIUM) 5 MG tablet Take 5 mg by mouth daily as needed for muscle spasms (cramps).     diclofenac (VOLTAREN) 75 MG EC tablet Take 75 mg by mouth 2 (two) times daily.     ferrous sulfate 325 (65 FE) MG tablet Take 325 mg by mouth daily with breakfast.     fluticasone (FLONASE) 50 MCG/ACT nasal spray Place 2 sprays into both nostrils daily. (Patient taking differently: Place 2 sprays into both nostrils daily as needed for allergies.) 18.2 mL 11   Fluticasone-Umeclidin-Vilant (TRELEGY ELLIPTA) 200-62.5-25 MCG/INH AEPB Inhale 1 puff into the lungs daily. 1 each 11   furosemide (LASIX) 40 MG tablet Take 1 tablet (40 mg total) by mouth daily. In the morning (Patient taking differently: Take 40-60 mg by mouth See admin instructions. Take 60 mg in the morning and 40 mg bedtime) 90  tablet 3    insulin glargine (LANTUS) 100 UNIT/ML Solostar Pen Inject 30 Units into the skin at bedtime.     insulin lispro (HUMALOG) 100 UNIT/ML KwikPen Inject 5-10 Units into the skin See admin instructions. Inject 5 units subcutaneously up to three times daily if CBG >200, inject 10 units up to three times daily if CBG >300     ipratropium-albuterol (DUONEB) 0.5-2.5 (3) MG/3ML SOLN Take 3 mLs by nebulization every 4 (four) hours as needed. 360 mL 11   JARDIANCE 10 MG TABS tablet Take 10 mg by mouth daily.     Lancets (ONETOUCH DELICA PLUS [id number]) MISC Apply 1 each topically 4 (four) times daily.     levothyroxine (SYNTHROID) 150 MCG tablet Take 150 mcg by mouth daily before breakfast.     liraglutide (VICTOZA) 18 MG/3ML SOPN Inject 1.8 mg into the skin at bedtime.     metoprolol succinate (TOPROL-XL) 25 MG 24 hr tablet Take 1.5 tablets (37.5 mg total) by mouth in the morning AND 1 tablet (25 mg total) every evening.     montelukast (SINGULAIR) 10 MG tablet Take 1 tablet (10 mg total) by mouth at bedtime. 30 tablet 11   Multiple Vitamin (MULTIVITAMIN WITH MINERALS) TABS tablet Take 1 tablet by mouth daily.     nitroGLYCERIN (NITROLINGUAL) 0.4 MG/SPRAY spray Place 1 spray under the tongue every 5 (five) minutes x 3 doses as needed for chest pain. 12 g 3   NOVOFINE PLUS PEN NEEDLE 32G X 4 MM MISC Inject into the skin.     nystatin (MYCOSTATIN) 100000 UNIT/ML suspension Take 5 mLs (500,000 Units total) by mouth 4 (four) times daily. 60 mL 0   omega-3 acid ethyl esters (LOVAZA) 1 g capsule Take 1 capsule (1 g total) by mouth 2 (two) times daily. 180 capsule 3   ONE TOUCH ULTRA TEST test strip Use as directed     Polyethyl Glycol-Propyl Glycol (SYSTANE OP) Place 1 drop into both eyes daily as needed (dry eyes).     potassium chloride SA (KLOR-CON) 20 MEQ tablet Take 20 mEq by mouth daily.     pregabalin (LYRICA) 100 MG capsule Take 100 mg by mouth 2 (two) times daily. MUST TAKE BRAND     Pyridoxine HCl (B-6  PO) Take 1 tablet by mouth daily.     ranolazine (RANEXA) 500 MG 12 hr tablet Take 1 tablet (500 mg total) by mouth 2 (two) times daily. 180 tablet 3   Respiratory Therapy Supplies (FLUTTER) DEVI 1 puff by Does not apply route daily. 1 each 0   traMADol (ULTRAM) 50 MG tablet Take 50 mg by mouth 3 (three) times daily as needed for pain.     triamterene-hydrochlorothiazide (MAXZIDE-25) 37.5-25 MG tablet Take 1 tablet by mouth daily. 90 tablet 1   verapamil (CALAN-SR) 120 MG CR tablet Take 120 mg by mouth at bedtime.     isosorbide mononitrate (IMDUR) 60 MG 24 hr tablet Take 1 tablet (60 mg total) by mouth daily. 90 tablet 3   rosuvastatin (CRESTOR) 20 MG tablet Take 1 tablet (20 mg total) by mouth daily. 90 tablet 3   Current Facility-Administered Medications  Medication Dose Route Frequency Provider Last Rate Last Admin   Mepolizumab SOLR 100 mg  100 mg Subcutaneous Q28 days Julian Hy, DO   100 mg at 05/21/20 1016    Social History   Socioeconomic History   Marital status: Widowed    Spouse name: Not on file   Number  of children: 0   Years of education: Not on file   Highest education level: Not on file  Occupational History   Occupation: retired  Tobacco Use   Smoking status: Former    Packs/day: 0.50    Years: 25.00    Pack years: 12.50    Types: Cigarettes    Quit date: 08/09/2003    Years since quitting: 17.6   Smokeless tobacco: Never  Vaping Use   Vaping Use: Never used  Substance and Sexual Activity   Alcohol use: No    Alcohol/week: 0.0 standard drinks   Drug use: No   Sexual activity: Not on file  Other Topics Concern   Not on file  Social History Narrative   Not on file   Social Determinants of Health   Financial Resource Strain: Not on file  Food Insecurity: Not on file  Transportation Needs: Not on file  Physical Activity: Not on file  Stress: Not on file  Social Connections: Not on file  Intimate Partner Violence: Not on file   Social she is  widowed. She does try to be active. There is no tobacco use. She does drink occasional alcohol.  Family History  Problem Relation Age of Onset   Hypertension Mother    Heart disease Mother    Stroke Mother    Prostate cancer Father    Heart disease Brother    Heart attack Sister    ROS General: Negative; No fevers, chills, or night sweats;  HEENT: Negative; No changes in vision or hearing, sinus congestion, difficulty swallowing Pulmonary: Negative; No cough, wheezing, shortness of breath, hemoptysis Cardiovascular: Negative; No chest pain, presyncope, syncope, palpitations Positive for rare, intermittent leg swelling GI: Positive for GERD; No nausea, vomiting, diarrhea, or abdominal pain GU: Negative; No dysuria, hematuria, or difficulty voiding Musculoskeletal: Positive for arthritis in her knees and hips; no myalgias, joint pain, or weakness Hematologic/Oncology: Negative; no easy bruising, bleeding Endocrine: Positive for hypothyroidism, and type 2 diabetes mellitus; Neuro: Negative; no changes in balance, headaches Skin: Negative; No rashes or skin lesions Psychiatric: Negative; No behavioral problems, depression Sleep: Positive OSA on CPAP;  No breakthrough snoring, daytime sleepiness, hypersomnolence, bruxism, restless legs, hypnogognic hallucinations, no cataplexy Other comprehensive 14 point system review is negative.   PE BP 110/66   Pulse 66   Resp 20   Ht _0  (1.575 m)   Wt 182 lb (82.6 kg)   SpO2 99%   BMI 33.29 kg/m    Repeat blood pressure by me was 100/64 supine and 100/64 standing.  Wt Readings from Last 3 Encounters:  03/19/21 182 lb (82.6 kg)  02/16/21 179 lb (81.2 kg)  01/20/21 174 lb 6.4 oz (79.1 kg)   General: Alert, oriented, no distress.  Skin: normal turgor, no rashes, warm and dry HEENT: Normocephalic, atraumatic. Pupils equal round and reactive to light; sclera anicteric; extraocular muscles intact; Nose without nasal septal  hypertrophy Mouth/Parynx benign; Mallinpatti scale 3 Neck: No JVD, no carotid bruits; normal carotid upstroke Lungs: clear to ausculatation and percussion; no wheezing or rales Chest wall: without tenderness to palpitation Heart: PMI not displaced, RRR, s1 s2 normal, 1/6 systolic murmur, no diastolic murmur, no rubs, gallops, thrills, or heaves Abdomen: soft, nontender; no hepatosplenomehaly, BS+; abdominal aorta nontender and not dilated by palpation. Back: no CVA tenderness Pulses 2+ Musculoskeletal: full range of motion, normal strength, no joint deformities Extremities: no clubbing cyanosis or edema, Homan's sign negative  Neurologic: grossly nonfocal; Cranial nerves grossly wnl Psychologic:  Normal mood and affect    March 20, 2019 ECG (independently read by me): NSR at 66, no significant STT changes; QTc 452 msec  January 20, 2021 ECG (independently read by me):  NSR at 76, no ectopy; QTc 452 msec  December 2020 ECG (independently read by me): NSRat 63; nonspecific T wave abnormality   September 2019 ECG (independently read by me): Normal sinus rhythm at 78 bpm.  No ectopy.  Normal intervals.     June 2020 ECG (independently read by me): NSR at 88; normal intervals. Nonspecific T changes.  May 2018 ECG (independently read by me): Sinus rhythm at 68 bpm.  Nonspecific T changes.  Normal intervals  September 2017 ECG (independently read by me): Normal sinus rhythm at 74 bpm with mild sinus arrhythmia.  Mild inferolateral T wave abnormalities.  September 2015 ECG (independently read by me): Normal sinus rhythm at 61 beats per minute.  QTc interval 4-6 Denise.  Nonspecific ST-T changes inferolaterally  December 2014 ECG: Normal sinus rhythm at 62 beats per minute. Nonspecific inferolateral T wave changes.  LABS: BMP Latest Ref Rng & Units 02/17/2021 02/16/2021 04/29/2020  Glucose 65 - 99 mg/dL 66 105(H) 134(H)  BUN 8 - 27 mg/dL 29(H) 31(H) 15  Creatinine 0.57 - 1.00 mg/dL 1.90(H) 2.18(H)  0.97  BUN/Creat Ratio 12 - 28 15 - -  Sodium 134 - 144 mmol/L 141 142 139  Potassium 3.5 - 5.2 mmol/L 4.3 4.3 4.4  Chloride 96 - 106 mmol/L 101 104 109  CO2 20 - 29 mmol/L 25 28 20(L)  Calcium 8.7 - 10.3 mg/dL 9.1 8.8(L) 8.8(L)   Hepatic Function Latest Ref Rng & Units 02/17/2021 04/29/2020 04/28/2020  Total Protein 6.0 - 8.5 g/dL 7.5 - -  Albumin 3.8 - 4.8 g/dL 4.0 2.9(L) 2.9(L)  AST 0 - 40 IU/L 13 - -  ALT 0 - 32 IU/L 10 - -  Alk Phosphatase 44 - 121 IU/L 109 - -  Total Bilirubin 0.0 - 1.2 mg/dL 0.3 - -  Bilirubin, Direct 0.00 - 0.40 mg/dL - - -   CBC Latest Ref Rng & Units 02/16/2021 04/29/2020 04/28/2020  WBC 4.0 - 10.5 K/uL 7.5 4.8 4.8  Hemoglobin 12.0 - 15.0 g/dL 11.5(L) 8.5(L) 8.6(L)  Hematocrit 36.0 - 46.0 % 38.0 29.0(L) 28.8(L)  Platelets 150 - 400 K/uL 348 374 375   Lab Results  Component Value Date   MCV 82.6 02/16/2021   MCV 76.1 (L) 04/29/2020   MCV 75.8 (L) 04/28/2020   Lab Results  Component Value Date   TSH 1.830 03/28/2018     Lab Results  Component Value Date   HGBA1C 8.2 (H) 02/16/2021   Lipid Panel     Component Value Date/Time   CHOL 186 02/17/2021 0947   TRIG 239 (H) 02/17/2021 0947   HDL 43 02/17/2021 0947   CHOLHDL 4.3 02/17/2021 0947   CHOLHDL 2.7 05/30/2014 0400   VLDL 31 05/30/2014 0400   LDLCALC 102 (H) 02/17/2021 0947    RADIOLOGY: No results found.  IMPRESSION:  1. Coronary artery disease involving native coronary artery of native heart without angina pectoris   2. Essential hypertension   3. Type 2 diabetes mellitus with complication, with long-term current use of insulin ([place])   4. Hyperlipidemia with target LDL less than 70   5. OSA (obstructive sleep apnea)   6. CKD (chronic kidney disease) stage 4, GFR 15-29 ml/min (HCC)   7. History of Stevens-Johnson syndrome ([place])     ASSESSMENT  AND PLAN: [name] is a 65 year old female with established CAD and is 15 years status post percutaneous coronary intervention to her  proximal RCA in 2007.  She had a 60 - 70% stenosis of her LAD as well as 20-30%. She has experienced recurrent episodes of probable musculoskeletal chest discomfort.  A nuclear stress test following in 2015 was interpreted as high risk; cardiac catheterization did not reveal significant change in her coronary anatomy and her previously placed RCA stent was widely patent.  Her LAD lesion was not physiologically significant by fractional flow reserve.  Due to exertional dyspnea, she underwent a follow-up echo Doppler study in September 2020 which revealed normal systolic and diastolic function with mild LVH and mild aortic insufficiency.  Since I had last seen her in 2020, she had been placed on Ranexa 500 mg twice a day by Denise Deforest, PA for chest pain which did improve her symptomatology.  At her visit with me in June 2022 her blood pressure was low and she was orthostatic.  At that time since she was taking furosemide 40 mg in the morning and 20 mg later in the day I recommended she discontinue her Maxide.  Apparently she developed recurrent leg swelling approximately 4 to 5 days later and admits to an 8 pound weight gain predominantly of fluid.  She therefore resumed her Maxide.  Her blood pressure today remains low but is not orthostatic.  She is not having any anginal symptomatology.  I have suggested she decrease her isosorbide from 120 mg down to 60 mg.  At her last visit I also reduced her metoprolol and her ECG shows sinus rhythm at 66 without ectopy and normal intervals.  She has borderline stage IV CKD with her creatinine at 1.9 in July improved from 2.18.  She tells me Dr. Forde [name] recheck lab work last week.  I do not have these results.  She also had elevated LDL cholesterol in this patient with known CAD.  I have recommended target LDL less than 70.  She has tolerated the increase of rosuvastatin from 5 to 10 mg and today I will further increase this to 20 mg for more high potency statin therapy.  Lovaza  was also added to her regimen with her triglycerides at 239.  She has an appointment to see Dr. Forde [name] later today.  She continues to be on Ranexa 500 mg twice daily in addition to her verapamil 120 mg at bedtime.  She is diabetic on insulin and Jardiance and Victoza.  She continues to be on levothyroxine 150 mcg for hypothyroidism. She continues to use CPAP for her OSA.  I will see her in 3 to 4 months for follow-up evaluation.   Troy Sine, MD, [facility]  03/19/2021 9:25 AM

## 2021-03-22 ENCOUNTER — Other Ambulatory Visit (HOSPITAL_COMMUNITY): Payer: Self-pay

## 2021-03-22 ENCOUNTER — Encounter: Payer: Self-pay | Admitting: Pulmonary Disease

## 2021-03-22 MED ORDER — NUCALA 100 MG/ML ~~LOC~~ SOAJ
100.0000 mg | SUBCUTANEOUS | 0 refills | Status: DC
Start: 2021-03-22 — End: 2021-03-29
  Filled 2021-03-22 (×2): qty 1, 28d supply, fill #0

## 2021-03-22 NOTE — Telephone Encounter (Signed)
Received notification from [facility] regarding a prior authorization for [place] PEN. Authorization has been APPROVED from 03/19/21 to 09/19/21.   Per test claim, copay for 1 month supply is $0.00. Patient has active [employer] as secondary. No pharmacy restrictions shown on test claim.  Authorization # [id number]

## 2021-03-22 NOTE — Telephone Encounter (Signed)
Shipment set up to deliver to clinic later this week.

## 2021-03-22 NOTE — Telephone Encounter (Signed)
Called patient and advised that she may self-administer. R/s her [facility] infusion center appointment to be pulmonary pharmacy clinic at 2:20pm.  Rx sent to [facility] to be couriered to clinic for appointment on 03/29/21.  Routing to [employer], RPh, and [employer] as Juluis Rainier. Therapy plan discontinued today.  Knox Saliva, PharmD, MPH, BCPS Clinical Pharmacist (Rheumatology and Pulmonology)

## 2021-03-23 ENCOUNTER — Other Ambulatory Visit (HOSPITAL_COMMUNITY): Payer: Self-pay

## 2021-03-29 ENCOUNTER — Other Ambulatory Visit: Payer: Self-pay

## 2021-03-29 ENCOUNTER — Ambulatory Visit: Payer: Medicare Other

## 2021-03-29 ENCOUNTER — Ambulatory Visit: Payer: Medicare Other | Admitting: Pharmacist

## 2021-03-29 ENCOUNTER — Other Ambulatory Visit (HOSPITAL_COMMUNITY): Payer: Self-pay

## 2021-03-29 DIAGNOSIS — J4551 Severe persistent asthma with (acute) exacerbation: Secondary | ICD-10-CM

## 2021-03-29 MED ORDER — TRELEGY ELLIPTA 200-62.5-25 MCG/INH IN AEPB: 1.0000 | INHALATION_SPRAY | Freq: Every day | RESPIRATORY_TRACT | 5 refills | Status: DC

## 2021-03-29 MED ORDER — NUCALA 100 MG/ML ~~LOC~~ SOAJ
100.0000 mg | SUBCUTANEOUS | 5 refills | Status: DC
  Filled 2021-03-29 – 2021-04-20 (×2): qty 1, 28d supply, fill #0
  Filled 2021-05-18: qty 1, 28d supply, fill #1
  Filled 2021-06-15: qty 1, 28d supply, fill #2
  Filled 2021-07-14: qty 1, 28d supply, fill #3
  Filled 2021-08-16: qty 1, 28d supply, fill #4
  Filled 2021-09-14: qty 1, 28d supply, fill #5

## 2021-03-29 NOTE — Progress Notes (Signed)
HPI Patient presents today to [facility] to see pharmacy team to transition to self-administration of Nucala.  She started Nucala on 10/31/2019 and has been receiving in the clinic since then.  Past medical history includes severe persistent asthma. 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)  Respiratory Medications Current regimen: Trelegy 200/62.5/25 mcg (1 puff once daily, fluticasone nasal spray daily as needed Patient reports no known adherence challenges  OBJECTIVE Allergies  Allergen Reactions   Allopurinol Rash    Toxic epidermal necrolysis    Codeine Other (See Comments)    REACTION: hallucinations/"loopy"   Levaquin [Levofloxacin] Nausea And Vomiting   Sulfa Antibiotics Hives   Sulfonamide Derivatives Hives    Outpatient Encounter Medications as of 03/29/2021  Medication Sig   albuterol (PROVENTIL HFA) 108 (90 Base) MCG/ACT inhaler Inhale 2 puffs into the lungs every 6 (six) hours as needed for wheezing or shortness of breath. For shortness of breath and wheezing   BIOTIN PO Take 500 mg by mouth daily.   clobetasol (TEMOVATE) 0.05 % GEL Apply 1 application topically daily as needed (Hair).   Cyanocobalamin (B-12 PO) Take 1,000 mg by mouth daily.   diazepam (VALIUM) 5 MG tablet Take 5 mg by mouth daily as needed for muscle spasms (cramps).   diclofenac (VOLTAREN) 75 MG EC tablet Take 75 mg by mouth 2 (two) times daily.   ferrous sulfate 325 (65 FE) MG tablet Take 325 mg by mouth daily with breakfast.   fluticasone (FLONASE) 50 MCG/ACT nasal spray Place 2 sprays into both nostrils daily. (Patient taking differently: Place 2 sprays into both nostrils daily as needed for allergies.)   Fluticasone-Umeclidin-Vilant (TRELEGY ELLIPTA) 200-62.5-25 MCG/INH AEPB Inhale 1 puff into the lungs daily.   furosemide (LASIX) 40 MG tablet Take 1 tablet (40 mg total) by mouth daily. In the morning (Patient taking differently: Take 40-60 mg by mouth See admin  instructions. Take 60 mg in the morning and 40 mg bedtime)   insulin glargine (LANTUS) 100 UNIT/ML Solostar Pen Inject 30 Units into the skin at bedtime.   insulin lispro (HUMALOG) 100 UNIT/ML KwikPen Inject 5-10 Units into the skin See admin instructions. Inject 5 units subcutaneously up to three times daily if CBG >200, inject 10 units up to three times daily if CBG >300   ipratropium-albuterol (DUONEB) 0.5-2.5 (3) MG/3ML SOLN Take 3 mLs by nebulization every 4 (four) hours as needed.   isosorbide mononitrate (IMDUR) 60 MG 24 hr tablet Take 1 tablet (60 mg total) by mouth daily.   JARDIANCE 10 MG TABS tablet Take 10 mg by mouth daily.   Lancets (ONETOUCH DELICA PLUS 123XX123) MISC Apply 1 each topically 4 (four) times daily.   levothyroxine (SYNTHROID) 150 MCG tablet Take 150 mcg by mouth daily before breakfast.   liraglutide (VICTOZA) 18 MG/3ML SOPN Inject 1.8 mg into the skin at bedtime.   Mepolizumab (NUCALA) 100 MG/ML SOAJ Inject 1 mL (100 mg total) into the skin every 28 (twenty-eight) days. Deliver to pulm clinic: [address], [address], [place], [name] [address]   metoprolol succinate (TOPROL-XL) 25 MG 24 hr tablet Take 1.5 tablets (37.5 mg total) by mouth in the morning AND 1 tablet (25 mg total) every evening.   montelukast (SINGULAIR) 10 MG tablet Take 1 tablet (10 mg total) by mouth at bedtime.   Multiple Vitamin (MULTIVITAMIN WITH MINERALS) TABS tablet Take 1 tablet by mouth daily.   nitroGLYCERIN (NITROLINGUAL) 0.4 MG/SPRAY spray Place 1 spray under the tongue every  5 (five) minutes x 3 doses as needed for chest pain.   NOVOFINE PLUS PEN NEEDLE 32G X 4 MM MISC Inject into the skin.   nystatin (MYCOSTATIN) 100000 UNIT/ML suspension Take 5 mLs (500,000 Units total) by mouth 4 (four) times daily.   omega-3 acid ethyl esters (LOVAZA) 1 g capsule Take 1 capsule (1 g total) by mouth 2 (two) times daily.   ONE TOUCH ULTRA TEST test strip Use as directed   Polyethyl Glycol-Propyl Glycol  (SYSTANE OP) Place 1 drop into both eyes daily as needed (dry eyes).   potassium chloride SA (KLOR-CON) 20 MEQ tablet Take 20 mEq by mouth daily.   pregabalin (LYRICA) 100 MG capsule Take 100 mg by mouth 2 (two) times daily. MUST TAKE BRAND   Pyridoxine HCl (B-6 PO) Take 1 tablet by mouth daily.   ranolazine (RANEXA) 500 MG 12 hr tablet Take 1 tablet (500 mg total) by mouth 2 (two) times daily.   Respiratory Therapy Supplies (FLUTTER) DEVI 1 puff by Does not apply route daily.   rosuvastatin (CRESTOR) 20 MG tablet Take 1 tablet (20 mg total) by mouth daily.   traMADol (ULTRAM) 50 MG tablet Take 50 mg by mouth 3 (three) times daily as needed for pain.   triamterene-hydrochlorothiazide (MAXZIDE-25) 37.5-25 MG tablet Take 1 tablet by mouth daily.   verapamil (CALAN-SR) 120 MG CR tablet Take 120 mg by mouth at bedtime.   No facility-administered encounter medications on file as of 03/29/2021.     Immunization History  Administered Date(s) Administered   Fluad Quad(high Dose 65+) 06/05/2019   Influenza Split 05/10/2011, 05/09/2012, 05/31/2013   Influenza Whole 05/08/2010, 05/26/2020   Influenza,inj,Quad PF,6+ Mos 09/14/2015   Influenza-Unspecified 06/04/2014   PFIZER(Purple Top)SARS-COV-2 Vaccination 10/03/2019, 10/25/2019   Pneumococcal Polysaccharide-23 08/09/2007     PFTs PFT Results Latest Ref Rng & Units 12/22/2015  FVC-Pre L 2.60  FVC-Predicted Pre % 106  FVC-Post L 2.48  FVC-Predicted Post % 102  Pre FEV1/FVC % % 72  Post FEV1/FCV % % 67  FEV1-Pre L 1.87  FEV1-Predicted Pre % 97  FEV1-Post L 1.67  DLCO uncorrected ml/min/mmHg 13.06  DLCO UNC% % 60  DLVA Predicted % 66  TLC L 4.76  TLC % Predicted % 100  RV % Predicted % 118    Assessment   Biologics training for mepolizumab (Nucala)  Goals of therapy: Mechanism of Action: Not fully understood. It does act an interleukin-5 (IL-5) antagonist monoclonal antibody that reduces the production and survival of eosinophils by  blocking the binding of IL-5 to the alpha chain of the receptor complex on the eosinophil cell surface. Reviewed that Nucala is add-on medication and patient must continue maintenance inhaler regimen. Response to therapy: may take 3 months to 6 months to determine efficacy. Discussed that patients generally feel improvement sooner than 3 months.  Side effects: headache (19%), injection site reaction (7-15%), antibody development (6%), backache (5%), fatigue (5%)  Dose: 100 mg subcutaneously every 4 weeks  Administration/Storage:  Reviewed administration sites of thigh or abdomen (at least 2-3 inches away from abdomen). Reviewed the upper arm is only appropriate if caregiver is administering injection  Do not shake the reconstituted solution as this could lead to product foaming or precipitation. Solution should be clear to opalescent and colorless to pale yellow or pale brown, essentially particle free. Small air bubbles, however, are expected and acceptable. If particulate matter remains in the solution or if the solution appears cloudy or milky, discard the solution.  Reviewed storage of medication in refrigerator. Reviewed that Nucala can be stored at room temperature in unopened carton for up to 7 days.  Access: Approval of Nucala through: insurance. No copay since she has [employer] as secondary  She self-administered using [facility]-supplied autoinjector: Nucala '100mg'$ /mL autoinjector NDC: [phone] Lot: [id number] Exp: 06/2023  Medication Reconciliation  A drug regimen assessment was performed, including review of allergies, interactions, disease-state management, dosing and immunization history. Medications were reviewed with the patient, including name, instructions, indication, goals of therapy, potential side effects, importance of adherence, and safe use.  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 She started this medication about 2-3  weeks ago per her estimate.  Immunizations  Patient is indicated for the influenzae, pneumonia, and shingles vaccinations. Patient has received 2 COVID19 vaccines - eligible for COVID19 booster  PLAN Continue Nucala '100mg'$  every 4 weeks. Next dose is due 04/26/21 and every 4 weeks thereafter. Rx sent to: [facility]: [phone] . She was provided with calendar for monthly dose due dates Continue maintenance asthma regimen of: Trelegy 217mg once daily. Patient advised to rinse mouth with warm water.  [facility]  All questions encouraged and answered.  Instructed patient to reach out with any further questions or concerns.  Thank you for allowing pharmacy to participate in this patient's care.  This appointment required 60 minutes of patient care (this includes precharting, chart review, review of results, face-to-face care, etc.).   DKnox Saliva PharmD, MPH, BCPS Clinical Pharmacist (Rheumatology and Pulmonology)

## 2021-03-29 NOTE — Patient Instructions (Signed)
Your next Nucala dose is due on 9/19, 10/17, and every 4 weeks thereafter  CONTINUE Trelegy 1 puff once daily  (Rinse mouth with WARM water but do not swallow water) CONTINUE Flonase nasal spray  Your prescription will be shipped from [facility]. Their phone number is [phone] They will call you to schedule shipment in a few weeks and confirm shipping address. They will mail the Nucala medication to your home.  You will need to be seen by Dr. Ander Slade provider in 3 months. Please ensure you have a follow-up appointment scheduled in 3 months. This is for safety monitoring while taking an asthma biologic medication.  Call our clinic if you need to make this appointment.  How to manage an injection site reaction: Remember the 5 C's: COUNTER - leave on the counter at least 30 minutes but up to overnight to bring medication to room temperature. This may help prevent stinging COLD - place something cold (like an ice gel pack or cold water bottle) on the injection site just before cleansing with alcohol. This may help reduce pain CLARITIN - use Claritin (generic name is loratadine) for the first two weeks of treatment or the day of, the day before, and the day after injecting. This will help to minimize injection site reactions CORTISONE CREAM - apply if injection site is irritated and itching CALL ME - if injection site reaction is bigger than the size of your fist, looks infected, blisters, or if you develop hives

## 2021-04-11 ENCOUNTER — Other Ambulatory Visit: Payer: Self-pay | Admitting: Cardiovascular Disease

## 2021-04-19 ENCOUNTER — Other Ambulatory Visit (HOSPITAL_COMMUNITY): Payer: Self-pay

## 2021-04-20 ENCOUNTER — Other Ambulatory Visit (HOSPITAL_COMMUNITY): Payer: Self-pay

## 2021-04-22 ENCOUNTER — Other Ambulatory Visit (HOSPITAL_COMMUNITY): Payer: Self-pay

## 2021-04-29 ENCOUNTER — Ambulatory Visit (INDEPENDENT_AMBULATORY_CARE_PROVIDER_SITE_OTHER): Payer: Medicare Other | Admitting: Pulmonary Disease

## 2021-04-29 ENCOUNTER — Encounter: Payer: Self-pay | Admitting: Pulmonary Disease

## 2021-04-29 ENCOUNTER — Other Ambulatory Visit: Payer: Self-pay

## 2021-04-29 VITALS — BP 130/70 | HR 79 | Temp 98.1°F | Ht 62.0 in | Wt 182.6 lb

## 2021-04-29 DIAGNOSIS — J8283 Eosinophilic asthma: Secondary | ICD-10-CM | POA: Diagnosis not present

## 2021-04-29 DIAGNOSIS — J4551 Severe persistent asthma with (acute) exacerbation: Secondary | ICD-10-CM | POA: Diagnosis not present

## 2021-04-29 MED ORDER — ADVAIR HFA 230-21 MCG/ACT IN AERO: 2.0000 | INHALATION_SPRAY | Freq: Two times a day (BID) | RESPIRATORY_TRACT | 6 refills | Status: DC

## 2021-04-29 NOTE — Progress Notes (Signed)
Synopsis: Referred in January 2021 for SOB by Reynold Bowen, MD.   Subjective:   PATIENT ID: [name] GENDER: female DOB: [birth year], MRN: [id number]  Patient with a history of severe persistent asthma Was started on Nucala  Just received her Nucala and had the first injection  She has been having some sores in her mouth and was treated for yeast infection despite rinsing her mouth with use of Trelegy  Was recently sick with Stevens-Johnson syndrome, hospitalized for few days Symptoms currently improving  Does have an occasional cough, minimal phlegm production  She does need to use albuterol Has not needed her nebulizer recently  Denies any pain or discomfort  She was on Advair in the past and Breo  She has a history of allergic rhinosinusitis, sinus drainage 2 weeks Flonase, Singulair  Not feeling acutely ill at present  Past Medical History:  Diagnosis Date   Acute bronchitis    Anemia    hx of anemia   Anginal pain (HCC)    Arthritis    osteo   Asthma    Coronary atherosclerosis    Cough    GERD (gastroesophageal reflux disease)    History of colon polyps    History of migraine    Hyperlipidemia    Hypertension    Obstructive sleep apnea (adult) (pediatric)    NO CPAP    Peripheral neuropathy    Type II or unspecified type diabetes mellitus without mention of complication, not stated as uncontrolled    type 2   Unspecified hypothyroidism      Family History  Problem Relation Age of Onset   Hypertension Mother    Heart disease Mother    Stroke Mother    Prostate cancer Father    Heart disease Brother    Heart attack Sister      Past Surgical History:  Procedure Laterality Date   ANTERIOR FUSION CERVICAL SPINE     CARPAL TUNNEL RELEASE Left    COLONOSCOPY     CORONARY ANGIOPLASTY     LEFT HEART CATHETERIZATION WITH CORONARY ANGIOGRAM N/A 05/29/2014   Procedure: LEFT HEART CATHETERIZATION WITH CORONARY ANGIOGRAM;  Surgeon: Sinclair Grooms, MD;  Location: [facility] CATH LAB;  Service: Cardiovascular;  Laterality: N/A;   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   Procedure: TOTAL KNEE ARTHROPLASTY;  Surgeon: Susa Day, MD;  Location: [facility];  Service: Orthopedics;  Laterality: Left;  3 hrs   TUBAL LIGATION     WISDOM TOOTH EXTRACTION      Social History   Socioeconomic History   Marital status: Widowed    Spouse name: Not on file   Number of children: 0   Years of education: Not on file   Highest education level: Not on file  Occupational History   Occupation: retired  Tobacco Use   Smoking status: Former    Packs/day: 0.50    Years: 25.00    Pack years: 12.50    Types: Cigarettes    Quit date: 08/09/2003    Years since quitting: 17.7   Smokeless tobacco: Never  Vaping Use   Vaping Use: Never used  Substance and Sexual Activity   Alcohol use: No    Alcohol/week: 0.0 standard drinks   Drug use: No   Sexual activity: Not on file  Other Topics Concern   Not on file  Social History Narrative   Not on file   Social Determinants of Health   Financial Resource Strain: Not on  file  Food Insecurity: Not on file  Transportation Needs: Not on file  Physical Activity: Not on file  Stress: Not on file  Social Connections: Not on file  Intimate Partner Violence: Not on file     Allergies  Allergen Reactions   Allopurinol Rash    Toxic epidermal necrolysis    Codeine Other (See Comments)    REACTION: hallucinations/"loopy"   Levaquin [Levofloxacin] Nausea And Vomiting   Sulfa Antibiotics Hives   Sulfonamide Derivatives Hives     Immunization History  Administered Date(s) Administered   Fluad Quad(high Dose 65+) 06/05/2019   Influenza Split 05/10/2011, 05/09/2012, 05/31/2013   Influenza Whole 05/08/2010, 05/26/2020   Influenza,inj,Quad PF,6+ Mos 09/14/2015, 04/19/2016, 04/24/2017   Influenza-Unspecified 04/22/2010, 08/08/2012, 06/04/2014, 05/14/2020   PFIZER(Purple Top)SARS-COV-2 Vaccination  10/03/2019, 10/25/2019   Pneumococcal Polysaccharide-23 08/09/2007   Td 08/08/2008    Outpatient Medications Prior to Visit  Medication Sig Dispense Refill   albuterol (PROVENTIL HFA) 108 (90 Base) MCG/ACT inhaler Inhale 2 puffs into the lungs every 6 (six) hours as needed for wheezing or shortness of breath. For shortness of breath and wheezing 18 each 2   BIOTIN PO Take 500 mg by mouth daily.     clobetasol (TEMOVATE) 0.05 % GEL Apply 1 application topically daily as needed (Hair).     Cyanocobalamin (B-12 PO) Take 1,000 mg by mouth daily.     diazepam (VALIUM) 5 MG tablet Take 5 mg by mouth daily as needed for muscle spasms (cramps).     diclofenac (VOLTAREN) 75 MG EC tablet Take 75 mg by mouth 2 (two) times daily.     ferrous sulfate 325 (65 FE) MG tablet Take 325 mg by mouth daily with breakfast.     Finerenone (KERENDIA) 10 MG TABS Take 10 mg by mouth daily.     fluticasone (FLONASE) 50 MCG/ACT nasal spray Place 2 sprays into both nostrils daily. (Patient taking differently: Place 2 sprays into both nostrils daily as needed for allergies.) 18.2 mL 11   Fluticasone-Umeclidin-Vilant (TRELEGY ELLIPTA) 200-62.5-25 MCG/INH AEPB Inhale 1 puff into the lungs daily. 1 each 5   furosemide (LASIX) 40 MG tablet Take 1 tablet (40 mg total) by mouth daily. In the morning (Patient taking differently: Take 40-60 mg by mouth See admin instructions. Take 60 mg in the morning and 40 mg bedtime) 90 tablet 3   insulin glargine (LANTUS) 100 UNIT/ML Solostar Pen Inject 30 Units into the skin at bedtime.     insulin lispro (HUMALOG) 100 UNIT/ML KwikPen Inject 5-10 Units into the skin See admin instructions. Inject 5 units subcutaneously up to three times daily if CBG >200, inject 10 units up to three times daily if CBG >300     ipratropium-albuterol (DUONEB) 0.5-2.5 (3) MG/3ML SOLN Take 3 mLs by nebulization every 4 (four) hours as needed. 360 mL 11   isosorbide mononitrate (IMDUR) 60 MG 24 hr tablet Take 1  tablet (60 mg total) by mouth daily. 90 tablet 3   JARDIANCE 10 MG TABS tablet Take 10 mg by mouth daily.     Lancets (ONETOUCH DELICA PLUS 123XX123) MISC Apply 1 each topically 4 (four) times daily.     levothyroxine (SYNTHROID) 150 MCG tablet Take 150 mcg by mouth daily before breakfast.     liraglutide (VICTOZA) 18 MG/3ML SOPN Inject 1.8 mg into the skin at bedtime.     Mepolizumab (NUCALA) 100 MG/ML SOAJ Inject 1 mL (100 mg total) into the skin every 28 (twenty-eight) days. 1 mL  5   metoprolol succinate (TOPROL-XL) 25 MG 24 hr tablet TAKE 1 AND 1/2 TABLET BY MOUTH TWICE A DAY 90 tablet 3   montelukast (SINGULAIR) 10 MG tablet Take 1 tablet (10 mg total) by mouth at bedtime. 30 tablet 11   Multiple Vitamin (MULTIVITAMIN WITH MINERALS) TABS tablet Take 1 tablet by mouth daily.     nitroGLYCERIN (NITROLINGUAL) 0.4 MG/SPRAY spray Place 1 spray under the tongue every 5 (five) minutes x 3 doses as needed for chest pain. 12 g 3   NOVOFINE PLUS PEN NEEDLE 32G X 4 MM MISC Inject into the skin.     omega-3 acid ethyl esters (LOVAZA) 1 g capsule Take 1 capsule (1 g total) by mouth 2 (two) times daily. 180 capsule 3   ONE TOUCH ULTRA TEST test strip Use as directed     Polyethyl Glycol-Propyl Glycol (SYSTANE OP) Place 1 drop into both eyes daily as needed (dry eyes).     potassium chloride SA (KLOR-CON) 20 MEQ tablet Take 20 mEq by mouth daily.     pregabalin (LYRICA) 100 MG capsule Take 100 mg by mouth 2 (two) times daily. MUST TAKE BRAND     Pyridoxine HCl (B-6 PO) Take 1 tablet by mouth daily.     ranolazine (RANEXA) 500 MG 12 hr tablet Take 1 tablet (500 mg total) by mouth 2 (two) times daily. 180 tablet 3   Respiratory Therapy Supplies (FLUTTER) DEVI 1 puff by Does not apply route daily. 1 each 0   rosuvastatin (CRESTOR) 20 MG tablet Take 1 tablet (20 mg total) by mouth daily. 90 tablet 3   traMADol (ULTRAM) 50 MG tablet Take 50 mg by mouth 3 (three) times daily as needed for pain.      triamterene-hydrochlorothiazide (MAXZIDE-25) 37.5-25 MG tablet Take 1 tablet by mouth daily. 90 tablet 1   verapamil (CALAN-SR) 120 MG CR tablet Take 120 mg by mouth at bedtime.     No facility-administered medications prior to visit.    Review of Systems  Constitutional:  Negative for chills, fever and weight loss.  HENT:  Negative for congestion.        Postnasal drip  Respiratory:  Positive for shortness of breath and wheezing. Negative for cough.   Cardiovascular:  Negative for chest pain and leg swelling.  Gastrointestinal:  Negative for heartburn.  Endo/Heme/Allergies:  Positive for environmental allergies.    Objective:   Vitals:   04/29/21 1129  BP: 130/70  Pulse: 79  Temp: 98.1 F (36.7 C)  TempSrc: Oral  SpO2: 96%  Weight: 182 lb 9.6 oz (82.8 kg)  Height: '5\' 2"'$  (1.575 m)   96% on   RA BMI Readings from Last 3 Encounters:  04/29/21 33.40 kg/m  03/19/21 33.29 kg/m  02/16/21 32.74 kg/m   Wt Readings from Last 3 Encounters:  04/29/21 182 lb 9.6 oz (82.8 kg)  03/19/21 182 lb (82.6 kg)  02/16/21 179 lb (81.2 kg)    Physical Exam Vitals reviewed.  Constitutional:      Appearance: Normal appearance. She is not ill-appearing.  HENT:     Head: Normocephalic and atraumatic.  Eyes:     General: No scleral icterus. Cardiovascular:     Rate and Rhythm: Normal rate and regular rhythm.     Heart sounds: No murmur heard. Pulmonary:     Effort: No respiratory distress.     Breath sounds: No stridor. No wheezing or rhonchi.  Abdominal:     General: There is no distension.  Musculoskeletal:     Cervical back: No rigidity or tenderness.  Lymphadenopathy:     Cervical: No cervical adenopathy.  Skin:    General: Skin is warm.  Neurological:     Mental Status: She is alert.     Motor: No weakness.     Coordination: Coordination normal.  Psychiatric:        Mood and Affect: Mood normal.        Behavior: Behavior normal.   CBC    Component Value Date/Time    WBC 7.5 02/16/2021 1400   RBC 4.60 02/16/2021 1400   HGB 11.5 (L) 02/16/2021 1400   HGB 10.9 (L) 03/28/2018 1031   HCT 38.0 02/16/2021 1400   HCT 35.4 03/28/2018 1031   PLT 348 02/16/2021 1400   PLT 312 03/28/2018 1031   MCV 82.6 02/16/2021 1400   MCV 77 (L) 03/28/2018 1031   MCH 25.0 (L) 02/16/2021 1400   MCHC 30.3 02/16/2021 1400   RDW 17.2 (H) 02/16/2021 1400   RDW 17.0 (H) 03/28/2018 1031   LYMPHSABS 0.7 04/26/2020 1240   MONOABS 0.9 04/26/2020 1240   EOSABS 0.1 04/26/2020 1240   BASOSABS 0.0 04/26/2020 1240    CHEMISTRY No results for input(s): NA, K, CL, CO2, GLUCOSE, BUN, CREATININE, CALCIUM, MG, PHOS in the last 168 hours. CrCl cannot be calculated (Patient's most recent lab result is older than the maximum 21 days allowed.).  09/05/2019 labs: IgE 419 Eosinophils 800  02/17/2021-Chem-7 within normal limits-BUN 29, creatinine 1.9, 02/16/2021 CBC within normal limits  Chest Imaging- films reviewed: CXR, 2 view 12/08/2015- C-spine hardware, otherwise normal No recent chest x-ray available  Pulmonary Functions Testing Results: PFT Results Latest Ref Rng & Units 12/22/2015  FVC-Pre L 2.60  FVC-Predicted Pre % 106  FVC-Post L 2.48  FVC-Predicted Post % 102  Pre FEV1/FVC % % 72  Post FEV1/FCV % % 67  FEV1-Pre L 1.87  FEV1-Predicted Pre % 97  FEV1-Post L 1.67  DLCO uncorrected ml/min/mmHg 13.06  DLCO UNC% % 60  DLVA Predicted % 66  TLC L 4.76  TLC % Predicted % 100  RV % Predicted % 118   2017- FVC 3.0 (97%) 3.18 (102%, +6%)  FEV1  2.05 (87%) 2.14 (91%, +5%) Ratio 68 TLC 106% RV 119% DLCO 62%    Echocardiogram 04/23/2019: LVEF 60 to 65%, mild LVH.  Normal diastolic function.  Normal LA, RV, RA.  Mild AI, otherwise normal valves.     Assessment & Plan:   Severe persistent asthma with elevated IgE and eosinophils -On Nucala -To continue nasal steroids and antihistamines -We will switch from Trelegy to Advair HFA  Chronic rhinosinusitis -Continue  current management with nasal steroids  History of GERD -Continue antireflux medications  History of Katherina Right syndrome -Breathing remains stable -Encouraged to call with any significant concerns with regards to her breathing  Follow-up in 3 months

## 2021-04-29 NOTE — Patient Instructions (Signed)
We will switch to Advair HFA from Trelegy Use with a spacer device  If you feel your asthma is not as well-controlled with Advair, we should go back to Trelegy  Call with significant concerns  We will see you back in about 3 months  Needs a spacer device

## 2021-05-03 ENCOUNTER — Ambulatory Visit: Payer: Medicare Other

## 2021-05-18 ENCOUNTER — Other Ambulatory Visit (HOSPITAL_COMMUNITY): Payer: Self-pay

## 2021-05-20 ENCOUNTER — Other Ambulatory Visit (HOSPITAL_COMMUNITY): Payer: Self-pay

## 2021-05-31 ENCOUNTER — Ambulatory Visit: Payer: Medicare Other

## 2021-06-01 ENCOUNTER — Telehealth: Payer: Self-pay | Admitting: Pulmonary Disease

## 2021-06-01 MED ORDER — DOXYCYCLINE HYCLATE 100 MG PO TABS: 100.0000 mg | ORAL_TABLET | Freq: Two times a day (BID) | ORAL | 0 refills | Status: DC

## 2021-06-01 MED ORDER — PREDNISONE 10 MG PO TABS: 20.0000 mg | ORAL_TABLET | Freq: Every day | ORAL | 0 refills | Status: DC

## 2021-06-01 NOTE — Telephone Encounter (Signed)
Patient is aware of below message and voiced his understanding.  Nothing further needed.   

## 2021-06-01 NOTE — Telephone Encounter (Signed)
Called and spoke with patient. She stated that she started feeling bad last Thursday with increased wheezing. Over the weekend she developed a fever but has not had one since Sunday. Her cough has been productive with thick green phlegm. She has been taking OTC Mucinex to help with the phlegm but it is not helping. She has also been using her albuterol HFA and nebs treatments without any relief.   She denies being around anyone who has been sick recently.   [facility] in [place] on [address], can you please advise? Thanks!

## 2021-06-01 NOTE — Telephone Encounter (Signed)
Prescription for short course of steroids and antibiotic called into pharmacy

## 2021-06-15 ENCOUNTER — Other Ambulatory Visit (HOSPITAL_COMMUNITY): Payer: Self-pay

## 2021-06-17 ENCOUNTER — Other Ambulatory Visit (HOSPITAL_COMMUNITY): Payer: Self-pay

## 2021-07-05 ENCOUNTER — Ambulatory Visit: Payer: Medicare Other

## 2021-07-14 ENCOUNTER — Other Ambulatory Visit (HOSPITAL_COMMUNITY): Payer: Self-pay

## 2021-07-20 ENCOUNTER — Other Ambulatory Visit (HOSPITAL_COMMUNITY): Payer: Self-pay

## 2021-07-28 ENCOUNTER — Telehealth: Payer: Self-pay | Admitting: Cardiovascular Disease

## 2021-07-28 MED ORDER — ISOSORBIDE MONONITRATE ER 60 MG PO TB24: 60.0000 mg | ORAL_TABLET | Freq: Every day | ORAL | 3 refills | Status: DC

## 2021-07-28 NOTE — Telephone Encounter (Signed)
°*  STAT* If patient is at the pharmacy, call can be transferred to refill team.   1. Which medications need to be refilled? (please list name of each medication and dose if known) isosorbide mononitrate (IMDUR) 60 MG 24 hr tablet  2. Which pharmacy/location (including street and city if local pharmacy) is medication to be sent to? [facility] [id number] - [place], [place] [address]  3. Do they need a 30 day or 90 day supply? 90 day supply

## 2021-08-10 ENCOUNTER — Ambulatory Visit (INDEPENDENT_AMBULATORY_CARE_PROVIDER_SITE_OTHER): Payer: Medicare Other | Admitting: Cardiovascular Disease

## 2021-08-10 ENCOUNTER — Other Ambulatory Visit: Payer: Self-pay

## 2021-08-10 ENCOUNTER — Encounter: Payer: Self-pay | Admitting: Cardiovascular Disease

## 2021-08-10 DIAGNOSIS — N184 Chronic kidney disease, stage 4 (severe): Secondary | ICD-10-CM

## 2021-08-10 DIAGNOSIS — E782 Mixed hyperlipidemia: Secondary | ICD-10-CM

## 2021-08-10 DIAGNOSIS — E039 Hypothyroidism, unspecified: Secondary | ICD-10-CM

## 2021-08-10 DIAGNOSIS — E118 Type 2 diabetes mellitus with unspecified complications: Secondary | ICD-10-CM

## 2021-08-10 DIAGNOSIS — Z794 Long term (current) use of insulin: Secondary | ICD-10-CM

## 2021-08-10 DIAGNOSIS — I2510 Atherosclerotic heart disease of native coronary artery without angina pectoris: Secondary | ICD-10-CM | POA: Diagnosis not present

## 2021-08-10 DIAGNOSIS — E785 Hyperlipidemia, unspecified: Secondary | ICD-10-CM

## 2021-08-10 MED ORDER — ISOSORBIDE MONONITRATE ER 30 MG PO TB24: 30.0000 mg | ORAL_TABLET | Freq: Every day | ORAL | 7 refills | Status: DC

## 2021-08-10 NOTE — Progress Notes (Signed)
Patient ID: [name], female   DOB: 1955/08/10, 66 y.o.   MRN: [id number]      PCP: Dr. Forde Dandy  HPI: [name] is a 66 y.o. female who presents to the office for a 2 month cardiology evaluation.   [name] has known coronary artery disease and in 2007 underwent insertion of a 3.0x13 mm Cypher DES stent to her proximal RCA. She did have concomitant CAD with 60-70% stenosis of her LAD and 20% circumflex stenosis. In July 2010 she develop recurrent chest pain and repeat catheterization showed 60% LAD stenosis, 20-30% circumflex stenosis and a widely patent RCA stent.  She has a history of hypertension, hyperlipidemia, hypothyroidism, diabetes mellitus, asthma, GERD, as well as intermittent leg swelling.  When I saw her in December, 2014, she had experienced some episodes of chest pain, which sounded musculoskeletal.  However, with her cardiac risk factor profile, and known CAD .  I recommended a nuclear stress test, but she did not have that done.  I last saw her in September 2015 and ultimately recommended that she undergo the nuclear stress study.  This was interpreted as a high risk study revealing a large moderate intensity reversible distal anterior apical and inferior defect.  As result, she underwent cardiac catheterization in October 2015.  Her catheterization findings were essentially unchanged from previously and showed a widely patent RCA stent.  There was segmental 50% proximal to mid LAD stenoses with an FFR of 0.94.  There were irregularities in the circumflex.  She had normal LV function.  She sees Dr. Forde Dandy and had laboratory in January 2017.  This was notable in that her cholesterol was 191, triglycerides 267, HDL 49, and LDL 89.  Gross was elevated at 190.  Her TSH was 0.11 and free T4 1 0.5.  Subsequent blood work in May 2017 showed a BUN of 24, creatinine 1.19.  Hemoglobin 10.9, hematocrit 34.0.  On 01/06/2016 she underwent an echo Doppler study which showed an  ejection fraction of 60-65% with grade 1 diastolic dysfunction.  There was trivial AR.  When I last saw her, she denied  any episodes of exertional chest tightness, but had rare episodes of chest wall discomfort which improves with aspirin.  She had occasional swelling of her ankles.  She also has a history of intermittent wheezing for which he takes Advair.  Laboratory by Dr. Forde Dandy had revealed significant triglyceride elevation and I recommended lovasa at 2 capsules twice a [name].    When I saw her in May 2018 she had experienced occasional episodes of wheezing in the winter months.  She has sleep apnea and is on CPAP.  She denied any episodes of chest pressure.  She was unaware of palpitations.  She went to the emergency room on 11/28/2016 with shortness of breath.  She was given a breathing treatment in the emergency room with moderate relief of her symptoms.  She was felt to have mild asthma with exacerbation.  Laboratory  showed stable renal function with a creatinine 1.13.  She was anemic and is being followed by Dr. Forde Dandy with her hemoglobin of 9.3, hematocrit of 29.8.  BNP was normal at 59.9.   After not seeing her for 25 months, I  saw her on February 04, 2019.  She was without recurrent anginal symptomatology, however she had noticed increasing episodes of shortness of breath over the previous 6 to 7 months.  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  Seen  Dr. Forde Dandy and her hemoglobin A1c was 6.6.  During that evaluation I recommended slight additional titration of her metoprolol succinate to 37.5 mg  which would be helpful both for more optimal blood pressure control as well as improvement in her baseline resting heart rate which was in the upper 80s.  She underwent a follow-up echo Doppler study on April 23, 2019 which showed normal systolic and diastolic parameters.  Ejection fraction was 60 to 65%.  There was mild left ventricular hypertrophy and mild aortic insufficiency.  I  saw her on May 06, 2019.  At that time, she complained of being fatigued and had noticed some mild lightheadedness and some exertional shortness of breath.  She was unaware of any wheezing and denied fever chills or night sweats.  She denied palpitations.  At that time, her blood pressure was low and I recommended she decrease isosorbide from 120 mg down to 60 mg and reduce Verapamil SR from 240 mg down to 120 mg.  I recommended she continue Toprol-XL 37.5 mg but to stagger this and take 12.5 mg in the morning and 25 mg at night.  She has been on levothyroxine 200 mcg for hypothyroidism followed by Dr. Forde Dandy and also was on [facility] for intermittent asthma.  She was on insulin and Victoza for her diabetes.  She was last seen by me in December 2020.  At that time her blood pressure had improved as has her lightheadedness.  An echo Doppler study in September 2020 showed an EF at 60 to 65% with mild LVH.  There was mild aortic insufficiency.  She has continued to use CPAP therapy.  She has had advance home care for her DME company.  Remotely she had been with choice and would like to be switched back to them.    She was evaluated by Denise Lofts, [name] on November 04, 2020.  Previous to that evaluation she was evaluated by Denise Deforest, [name] in January 2022 with complaints of some mild chest discomfort.  She was started on Ranexa 500 mg twice a [name].  When seen by [name], her chest pain had improved.  She has a history of [name] syndrome.  I saw her on January 20, 2021.  At that time she had noted some mild lightheadedness.  She had been taking  furosemide and has been taking 40 mg in the morning and 20 mg later in the [name], Jardiance 10 mg daily, isosorbide 120 mg, metoprolol succinate 37.5 mg twice a [name] dosing 500 mg twice daily, Maxide 37/25 mg daily and verapamil 120 mg mg daily.  She denied any chest tightness.  She has been followed by Dr. Forde Dandy who has been checking her laboratory.  She continues to  be on Jardiance and insulin for her diabetes and levothyroxine for hypothyroidism.  During that evaluation, she was orthostatic with her blood pressure 100/60 supine dropping to 78/60 while standing.  I recommended that since she was taking furosemide 40 mg in the morning and 20 mg in the afternoon that she discontinue her Maxide.  She also had been on metoprolol succinate 37.5 mg twice a [name] and I recommended she reduce her evening dose down to 25 mg.  Dr. Forde Dandy has been checking her laboratory.  Her creatinine was elevated at 1.9 but improved from 2.18.  She underwent subsequent lipid studies and triglycerides were 239, VLDL 41, and LDL at 102.  At the time she was on rosuvastatin 5 mg.  She was contacted to  increase rosuvastatin and also add Lovaza 1 capsule twice a [name].  I last saw her in March 19, 2021.  She tells me approximately 5 days after stopping her Maxide she began to notice more lower extremity edema.  She was trying to avoid sodium intake.  Ultimately she resumed taking her Maxide.  She saw Dr. Forde Dandy last week for laboratory and has a follow-up office visit later today.  She denied any chest pain PND orthopnea.  Her lightheadedness has improved.  Since I last saw her, she states she feels well.  She stopped using CPAP several years ago and admits that she is sleeping well.  There is no snoring.  Her sleep is restorative.  She is now on furosemide 40 mg daily, Maxide 37.5/25 mg for swelling, isosorbide 60 mg, metoprolol succinate 37.5 mg, verapamil 120 mg and ranolazine 500 mg twice a [name].  She is not having any anginal symptomatology.  She is on Jardiance, insulin, Mounjaro injection weekly, for diabetes mellitus, she is on Wille Glaser and is followed by nephrology.  She is on albuterol as needed, Singulair, and Nucala injection monthly for her asthma.  She is on rosuvastatin 20 mg and lovaza a 1 g twice a [name] for hyperlipidemia.  She presents for evaluation.  Past Medical History:  Diagnosis  Date   Acute bronchitis    Anemia    hx of anemia   Anginal pain (HCC)    Arthritis    osteo   Asthma    Coronary atherosclerosis    Cough    GERD (gastroesophageal reflux disease)    History of colon polyps    History of migraine    Hyperlipidemia    Hypertension    Obstructive sleep apnea (adult) (pediatric)    NO CPAP    Peripheral neuropathy    Type II or unspecified type diabetes mellitus without mention of complication, not stated as uncontrolled    type 2   Unspecified hypothyroidism     Past Surgical History:  Procedure Laterality Date   ANTERIOR FUSION CERVICAL SPINE     CARPAL TUNNEL RELEASE Left    COLONOSCOPY     CORONARY ANGIOPLASTY     LEFT HEART CATHETERIZATION WITH CORONARY ANGIOGRAM N/A 05/29/2014   Procedure: LEFT HEART CATHETERIZATION WITH CORONARY ANGIOGRAM;  Surgeon: Denise Grooms, MD;  Location: [facility] CATH LAB;  Service: Cardiovascular;  Laterality: N/A;   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   Procedure: TOTAL KNEE ARTHROPLASTY;  Surgeon: Denise Day, MD;  Location: [facility] ORS;  Service: Orthopedics;  Laterality: Left;  3 hrs   TUBAL LIGATION     WISDOM TOOTH EXTRACTION      Allergies  Allergen Reactions   Allopurinol Rash    Toxic epidermal necrolysis    Codeine Other (See Comments)    REACTION: hallucinations/"loopy"   Levaquin [Levofloxacin] Nausea And Vomiting   Sulfa Antibiotics Hives   Sulfonamide Derivatives Hives    Current Outpatient Medications  Medication Sig Dispense Refill   albuterol (PROVENTIL HFA) 108 (90 Base) MCG/ACT inhaler Inhale 2 puffs into the lungs every 6 (six) hours as needed for wheezing or shortness of breath. For shortness of breath and wheezing 18 each 2   BIOTIN PO Take 500 mg by mouth daily.     clobetasol (TEMOVATE) 0.05 % GEL Apply 1 application topically daily as needed (Hair).     Cyanocobalamin (B-12 PO) Take 1,000 mg by mouth daily.     diazepam (VALIUM) 5 MG tablet Take  5 mg by mouth daily as needed  for muscle spasms (cramps).     diclofenac (VOLTAREN) 75 MG EC tablet Take 75 mg by mouth 2 (two) times daily.     ferrous sulfate 325 (65 FE) MG tablet Take 325 mg by mouth daily with breakfast.     Finerenone (KERENDIA) 10 MG TABS Take 10 mg by mouth daily.     fluticasone (FLONASE) 50 MCG/ACT nasal spray Place 2 sprays into both nostrils daily. (Patient taking differently: Place 2 sprays into both nostrils daily as needed for allergies.) 18.2 mL 11   fluticasone-salmeterol (ADVAIR HFA) 230-21 MCG/ACT inhaler Inhale 2 puffs into the lungs 2 (two) times daily. 8 g 6   furosemide (LASIX) 40 MG tablet Take 1 tablet (40 mg total) by mouth daily. In the morning (Patient taking differently: Take 40-60 mg by mouth See admin instructions. Take 60 mg in the morning and 40 mg bedtime) 90 tablet 3   insulin glargine (LANTUS) 100 UNIT/ML Solostar Pen Inject 30 Units into the skin at bedtime.     insulin lispro (HUMALOG) 100 UNIT/ML KwikPen Inject 5-10 Units into the skin See admin instructions. Inject 5 units subcutaneously up to three times daily if CBG >200, inject 10 units up to three times daily if CBG >300     ipratropium-albuterol (DUONEB) 0.5-2.5 (3) MG/3ML SOLN Take 3 mLs by nebulization every 4 (four) hours as needed. 360 mL 11   JARDIANCE 10 MG TABS tablet Take 10 mg by mouth daily.     Lancets (ONETOUCH DELICA PLUS EQASTM19Q) MISC Apply 1 each topically 4 (four) times daily.     levothyroxine (SYNTHROID) 150 MCG tablet Take 150 mcg by mouth daily before breakfast.     Mepolizumab (NUCALA) 100 MG/ML SOAJ Inject 1 mL (100 mg total) into the skin every 28 (twenty-eight) days. 1 mL 5   metoprolol succinate (TOPROL-XL) 25 MG 24 hr tablet TAKE 1 AND 1/2 TABLET BY MOUTH TWICE A [name] 90 tablet 3   montelukast (SINGULAIR) 10 MG tablet Take 1 tablet (10 mg total) by mouth at bedtime. 30 tablet 11   Multiple Vitamin (MULTIVITAMIN WITH MINERALS) TABS tablet Take 1 tablet by mouth daily.     nitroGLYCERIN  (NITROLINGUAL) 0.4 MG/SPRAY spray Place 1 spray under the tongue every 5 (five) minutes x 3 doses as needed for chest pain. 12 g 3   NOVOFINE PLUS PEN NEEDLE 32G X 4 MM MISC Inject into the skin.     omega-3 acid ethyl esters (LOVAZA) 1 g capsule Take 1 capsule (1 g total) by mouth 2 (two) times daily. 180 capsule 3   ONE TOUCH ULTRA TEST test strip Use as directed     potassium chloride SA (KLOR-CON) 20 MEQ tablet Take 20 mEq by mouth daily.     pregabalin (LYRICA) 100 MG capsule Take 100 mg by mouth 2 (two) times daily. MUST TAKE BRAND     Pyridoxine HCl (B-6 PO) Take 1 tablet by mouth daily.     ranolazine (RANEXA) 500 MG 12 hr tablet Take 1 tablet (500 mg total) by mouth 2 (two) times daily. 180 tablet 3   Respiratory Therapy Supplies (FLUTTER) DEVI 1 puff by Does not apply route daily. 1 each 0   rosuvastatin (CRESTOR) 20 MG tablet Take 1 tablet (20 mg total) by mouth daily. 90 tablet 3   tirzepatide (MOUNJARO) 5 MG/0.5ML Pen inject 49m     traMADol (ULTRAM) 50 MG tablet Take 50 mg by mouth 3 (  three) times daily as needed for pain.     triamterene-hydrochlorothiazide (MAXZIDE-25) 37.5-25 MG tablet Take 1 tablet by mouth daily. 90 tablet 1   verapamil (CALAN-SR) 120 MG CR tablet Take 120 mg by mouth at bedtime.     isosorbide mononitrate (IMDUR) 30 MG 24 hr tablet Take 1 tablet (30 mg total) by mouth daily. 30 tablet 7   No current facility-administered medications for this visit.    Social History   Socioeconomic History   Marital status: Widowed    Spouse name: Not on file   Number of children: 0   Years of education: Not on file   Highest education level: Not on file  Occupational History   Occupation: retired  Tobacco Use   Smoking status: Former    Packs/[name]: 0.50    Years: 25.00    Pack years: 12.50    Types: Cigarettes    Quit date: 08/09/2003    Years since quitting: 18.0   Smokeless tobacco: Never  Vaping Use   Vaping Use: Never used  Substance and Sexual Activity    Alcohol use: No    Alcohol/week: 0.0 standard drinks   Drug use: No   Sexual activity: Not on file  Other Topics Concern   Not on file  Social History Narrative   Not on file   Social Determinants of Health   Financial Resource Strain: Not on file  Food Insecurity: Not on file  Transportation Needs: Not on file  Physical Activity: Not on file  Stress: Not on file  Social Connections: Not on file  Intimate Partner Violence: Not on file   Social she is widowed. She does try to be active. There is no tobacco use. She does drink occasional alcohol.  Family History  Problem Relation Age of Onset   Hypertension Mother    Heart disease Mother    Stroke Mother    Prostate cancer Father    Heart disease Brother    Heart attack Sister    ROS General: Negative; No fevers, chills, or night sweats;  HEENT: Negative; No changes in vision or hearing, sinus congestion, difficulty swallowing Pulmonary: Negative; No cough, wheezing, shortness of breath, hemoptysis Cardiovascular: Negative; No chest pain, presyncope, syncope, palpitations Positive for rare, intermittent leg swelling GI: Positive for GERD; No nausea, vomiting, diarrhea, or abdominal pain GU: Negative; No dysuria, hematuria, or difficulty voiding Musculoskeletal: Positive for arthritis in her knees and hips; no myalgias, joint pain, or weakness Hematologic/Oncology: Negative; no easy bruising, bleeding Endocrine: Positive for hypothyroidism, and type 2 diabetes mellitus; Neuro: Negative; no changes in balance, headaches Skin: Negative; No rashes or skin lesions Psychiatric: Negative; No behavioral problems, depression Sleep: Positive OSA on CPAP;  No breakthrough snoring, daytime sleepiness, hypersomnolence, bruxism, restless legs, hypnogognic hallucinations, no cataplexy Other comprehensive 14 point system review is negative.   PE BP (!) 95/58    Pulse 67    Ht 5' 2"  (1.575 m)    Wt 173 lb 9.6 oz (78.7 kg)    SpO2 97%     BMI 31.75 kg/m    Repeat blood pressure by me was 98/60 supine and 90/58 standing.  Wt Readings from Last 3 Encounters:  08/10/21 173 lb 9.6 oz (78.7 kg)  04/29/21 182 lb 9.6 oz (82.8 kg)  03/19/21 182 lb (82.6 kg)   General: Alert, oriented, no distress.  Skin: normal turgor, no rashes, warm and dry HEENT: Normocephalic, atraumatic. 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 Back: no CVA tenderness Pulses 2+ Musculoskeletal: full range of motion, normal strength, no joint deformities Extremities: Trace ankle edema bilaterally no clubbing cyanosis,  Homan's sign negative  Neurologic: grossly nonfocal; Cranial nerves grossly wnl Psychologic: Normal mood and affect   ECG (independently read by me): NSR at 67; normal intervals.  Baseline artifact.   March 19, 2021 ECG (independently read by me): NSR at 66, no significant STT changes; QTc 452 msec  January 20, 2021 ECG (independently read by me):  NSR at 76, no ectopy; QTc 452 msec  December 2020 ECG (independently read by me): NSRat 63; nonspecific T wave abnormality   September 2019 ECG (independently read by me): Normal sinus rhythm at 78 bpm.  No ectopy.  Normal intervals.     June 2020 ECG (independently read by me): NSR at 88; normal intervals. Nonspecific T changes.  May 2018 ECG (independently read by me): Sinus rhythm at 68 bpm.  Nonspecific  T changes.  Normal intervals  September 2017 ECG (independently read by me): Normal sinus rhythm at 74 bpm with mild sinus arrhythmia.  Mild inferolateral T wave abnormalities.  September 2015 ECG (independently read by me): Normal sinus rhythm at 61 beats per minute.  QTc interval 4-6 Denise.  Nonspecific ST-T changes inferolaterally  December 2014 ECG: Normal sinus rhythm at 62 beats per minute. Nonspecific inferolateral T wave changes.  LABS: BMP Latest Ref Rng & Units 02/17/2021 02/16/2021 04/29/2020  Glucose 65 - 99 mg/dL 66 105(H) 134(H)  BUN 8 - 27 mg/dL 29(H) 31(H) 15  Creatinine 0.57 - 1.00 mg/dL 1.90(H) 2.18(H) 0.97  BUN/Creat Ratio 12 - 28 15 - -  Sodium 134 - 144 mmol/L 141 142 139  Potassium 3.5 - 5.2 mmol/L 4.3 4.3 4.4  Chloride 96 - 106 mmol/L 101 104 109  CO2 20 - 29 mmol/L 25 28 20(L)  Calcium 8.7 - 10.3 mg/dL 9.1 8.8(L) 8.8(L)   Hepatic Function Latest Ref Rng & Units 02/17/2021 04/29/2020 04/28/2020  Total Protein 6.0 - 8.5 g/dL 7.5 - -  Albumin 3.8 - 4.8 g/dL 4.0 2.9(L) 2.9(L)  AST 0 - 40 IU/L 13 - -  ALT 0 - 32 IU/L 10 - -  Alk Phosphatase 44 - 121 IU/L 109 - -  Total Bilirubin 0.0 - 1.2 mg/dL 0.3 - -  Bilirubin, Direct 0.00 - 0.40 mg/dL - - -   CBC Latest Ref Rng & Units 02/16/2021 04/29/2020 04/28/2020  WBC 4.0 - 10.5 K/uL 7.5 4.8 4.8  Hemoglobin 12.0 - 15.0 g/dL 11.5(L) 8.5(L) 8.6(L)  Hematocrit 36.0 - 46.0 % 38.0 29.0(L) 28.8(L)  Platelets 150 - 400 K/uL 348 374 375   Lab Results  Component Value Date   MCV 82.6 02/16/2021   MCV 76.1 (L) 04/29/2020   MCV 75.8 (L) 04/28/2020   Lab Results  Component Value Date   TSH 1.830 03/28/2018     Lab Results  Component Value Date   HGBA1C 8.2 (H) 02/16/2021   Lipid Panel     Component Value Date/Time  CHOL 186 02/17/2021 0947   TRIG 239 (H) 02/17/2021 0947   HDL 43 02/17/2021 0947   CHOLHDL 4.3 02/17/2021 0947   CHOLHDL 2.7 05/30/2014 0400   VLDL 31 05/30/2014 0400   LDLCALC 102 (H) 02/17/2021 0947     RADIOLOGY: No results found.  IMPRESSION:  1. Coronary artery disease involving native coronary artery of native heart without angina pectoris   2. Type 2 diabetes mellitus with complication, with long-term current use of insulin ([place])   3. CKD (chronic kidney disease) stage 4, GFR 15-29 ml/min (HCC)   4. Hyperlipidemia with target LDL less than 70   5. Mixed hyperlipidemia   6. Hypothyroidism, unspecified type     ASSESSMENT AND PLAN: Denise. Saldarriaga is a 66 year old female with established CAD and is 15 1/2 years status post percutaneous coronary intervention to her proximal RCA in 2007.  She had a 60 - 70% stenosis of her LAD as well as 20-30%. She has experienced recurrent episodes of probable musculoskeletal chest discomfort.  A nuclear stress test following in 2015 was interpreted as high risk; cardiac catheterization did not reveal significant change in her coronary anatomy and her previously placed RCA stent was widely patent.  Her LAD lesion was not physiologically significant by fractional flow reserve.  Due to exertional dyspnea, she underwent a follow-up echo Doppler study in September 2020 which revealed normal systolic and diastolic function with mild LVH and mild aortic insufficiency.  SHe was started on  Ranexa 500 mg twice a [name] by Denise Deforest, [name] for chest pain which did improve her symptomatology.  At her visit with me in June 2022 her blood pressure was low and she was orthostatic.  At that time since she was taking furosemide 40 mg in the morning and 20 mg later in the [name] I recommended she discontinue her Maxide.  Apparently she developed recurrent leg swelling approximately 4 to 5 days later and admits to an 8 pound weight gain predominantly of fluid.  She therefore resumed her Maxide.  She has stage IV CKD and is followed by Dr. Osborne Casco at  [facility].  He is now on Kerendia 10 mg daily.  She is not having any anginal symptomatology.  With her low blood pressure today, I am  recommending she decrease her isosorbide from 60 mg daily down to 30 mg.  She continues to be on ranolazine 500 mg twice a [name].  She is followed by Dr. Forde Dandy for hypothyroidism on levothyroxine and her diabetes mellitus currently on Jardiance, insulin, and Mounjaro weekly injection.  She has mixed hyperlipidemia and is now on rosuvastatin 20 mg and omega-3 fatty acid 1 g twice a [name].  Dr. Forde Dandy will be checking laboratory.  Her asthma is controlled on Advair, as needed albuterol, and Nucala monthly injection.  She has a follow-up appointment to see Dr. Forde Dandy later today.  I will see her in 6 months for follow-up evaluation or sooner as needed.    Troy Sine, MD, [facility]  08/10/2021 9:15 AM

## 2021-08-10 NOTE — Patient Instructions (Signed)
Medication Instructions:  DECREASE ISOSORBIDE 30MG  DAILY  *If you need a refill on your cardiac medications before your next appointment, please call your pharmacy*  Lab Work:   Testing/Procedures:  NONE    NONE  Follow-Up: Your next appointment:  6 month(s) In Person with Shelva Majestic, MD   Please call our office 2 months in advance to schedule this appointment   At [facility], you and your health needs are our priority.  As part of our continuing mission to provide you with exceptional heart care, we have created designated Provider Care Teams.  These Care Teams include your primary Cardiologist (physician) and Advanced Practice Providers (APPs -  Physician Assistants and Nurse Practitioners) who all work together to provide you with the care you need, when you need it.

## 2021-08-16 ENCOUNTER — Other Ambulatory Visit (HOSPITAL_COMMUNITY): Payer: Self-pay

## 2021-08-23 ENCOUNTER — Other Ambulatory Visit (HOSPITAL_COMMUNITY): Payer: Self-pay

## 2021-08-30 ENCOUNTER — Other Ambulatory Visit (HOSPITAL_COMMUNITY): Payer: Self-pay

## 2021-08-30 ENCOUNTER — Ambulatory Visit: Payer: Medicare Other

## 2021-09-01 ENCOUNTER — Ambulatory Visit: Payer: Self-pay | Admitting: Orthopedic Surgery

## 2021-09-01 DIAGNOSIS — M1711 Unilateral primary osteoarthritis, right knee: Secondary | ICD-10-CM

## 2021-09-14 ENCOUNTER — Other Ambulatory Visit (HOSPITAL_COMMUNITY): Payer: Self-pay

## 2021-09-20 ENCOUNTER — Other Ambulatory Visit (HOSPITAL_COMMUNITY): Payer: Self-pay

## 2021-09-20 NOTE — Patient Instructions (Addendum)
DUE TO COVID-19 ONLY ONE VISITOR IS ALLOWED TO COME WITH YOU AND STAY IN THE WAITING ROOM ONLY DURING PRE OP AND PROCEDURE DAY OF SURGERY IF YOU ARE GOING HOME AFTER SURGERY. IF YOU ARE SPENDING THE NIGHT 2 PEOPLE MAY VISIT WITH YOU IN YOUR PRIVATE ROOM AFTER SURGERY UNTIL VISITING  HOURS ARE OVER AT 800 PM AND 1  VISITOR  MAY  SPEND THE NIGHT.   YOU NEED TO HAVE A COVID 19 TEST ON_2/22______ @___9 :15____, THIS TEST MUST BE DONE BEFORE SURGERY,                [name]     Your procedure is scheduled on: 10/01/21   Report to [facility] Main  Entrance   Report to admitting at  10:30 AM     Call this number if you have problems the morning of surgery [phone]    No food after midnight.    You may have clear liquid until 10:00 AM.    At 9:30 AM drink pre surgery drink.   Nothing by mouth after 10:00 AM.   CLEAR LIQUID DIET   Foods Allowed                                                                     Foods Excluded  Coffee and tea, regular and decaf                             liquids that you cannot  Plain Jell-O any favor except red or purple                                           see through such as: Fruit ices (not with fruit pulp)                                     milk, soups, orange juice  Iced Popsicles                                    All solid food Carbonated beverages, regular and diet                                    Cranberry, grape and apple juices Sports drinks like Gatorade Lightly seasoned clear broth or consume(fat free) Sugar   BRUSH YOUR TEETH MORNING OF SURGERY AND RINSE YOUR MOUTH OUT, NO CHEWING GUM CANDY OR MINTS.   Use your inhalers and bring them with you.  Bring your mask and tubing   Take these medicines the morning of surgery with A SIP OF WATER: Isosorbide, Ranolazine, Lyrica, Metoprolol, levothyroxine  How to Manage Your Diabetes Before and After Surgery  Why is it important to control my blood sugar  before and after surgery? Improving blood sugar levels before and after surgery helps healing and can limit problems. A  way of improving blood sugar control is eating a healthy diet by:  Eating less sugar and carbohydrates  Increasing activity/exercise  Talking with your doctor about reaching your blood sugar goals High blood sugars (greater than 180 mg/dL) can raise your risk of infections and slow your recovery, so you will need to focus on controlling your diabetes during the weeks before surgery. Make sure that the doctor who takes care of your diabetes knows about your planned surgery including the date and location.  How do I manage my blood sugar before surgery? Check your blood sugar at least 4 times a day, starting 2 days before surgery, to make sure that the level is not too high or low. Check your blood sugar the morning of your surgery when you wake up and every 2 hours until you get to the Short Stay unit. If your blood sugar is less than 70 mg/dL, you will need to treat for low blood sugar: Do not take insulin. Treat a low blood sugar (less than 70 mg/dL) with  cup of clear juice (cranberry or apple), 4 glucose tablets, OR glucose gel. Recheck blood sugar in 15 minutes after treatment (to make sure it is greater than 70 mg/dL). If your blood sugar is not greater than 70 mg/dL on recheck, call [phone] for further instructions. Report your blood sugar to the short stay nurse when you get to Short Stay.  If you are admitted to the hospital after surgery: Your blood sugar will be checked by the staff and you will probably be given insulin after surgery (instead of oral diabetes medicines) to make sure you have good blood sugar levels. The goal for blood sugar control after surgery is 80-180 mg/dL.   WHAT DO I DO ABOUT MY DIABETES MEDICATION?         Do not take your Jardiance the day before surgery  Do not take oral diabetes medicines (pills) the morning of  surgery.   The day of surgery, do not take other diabetes injectables, including Byetta (exenatide), Bydureon (exenatide ER), Victoza (liraglutide), or Trulicity (dulaglutide).  If your CBG is greater than 220 mg/dL, you may take  of your sliding scale  (correction) dose of insulin.                                  You may not have any metal on your body including hair pins and              piercings  Do not wear jewelry, make-up, lotions, powders or perfumes, deodorant             Do not wear nail polish on your fingernails.  Do not shave  48 hours prior to surgery.                 Do not bring valuables to the hospital. [place].  Contacts, dentures or bridgework may not be worn into surgery.  Leave suitcase in the car. After surgery it may be brought to your room.                  Please read over the following fact sheets you were given: _____________________________________________________________________             [facility] - Preparing for Surgery Before surgery, you  can play an important role.  Because skin is not sterile, your skin needs to be as free of germs as possible.  You can reduce the number of germs on your skin by washing with CHG (chlorahexidine gluconate) soap before surgery.  CHG is an antiseptic cleaner which kills germs and bonds with the skin to continue killing germs even after washing. Please DO NOT use if you have an allergy to CHG or antibacterial soaps.  If your skin becomes reddened/irritated stop using the CHG and inform your nurse when you arrive at Short Stay. Do not shave (including legs and underarms) for at least 48 hours prior to the first CHG shower. Please follow these instructions carefully:  1.  Shower with CHG Soap the night before surgery and the  morning of Surgery.  2.  If you choose to wash your hair, wash your hair first as usual with your  normal  shampoo.  3.  After you shampoo,  rinse your hair and body thoroughly to remove the  shampoo.                            4.  Use CHG as you would any other liquid soap.  You can apply chg directly  to the skin and wash                       Gently with a scrungie or clean washcloth.  5.  Apply the CHG Soap to your body ONLY FROM THE NECK DOWN.   Do not use on face/ open                           Wound or open sores. Avoid contact with eyes, ears mouth and genitals (private parts).                       Wash face,  Genitals (private parts) with your normal soap.             6.  Wash thoroughly, paying special attention to the area where your surgery  will be performed.  7.  Thoroughly rinse your body with warm water from the neck down.  8.  DO NOT shower/wash with your normal soap after using and rinsing off  the CHG Soap.                9.  Pat yourself dry with a clean towel.            10.  Wear clean pajamas.            11.  Place clean sheets on your bed the night of your first shower and do not  sleep with pets. Day of Surgery : Do not apply any lotions/deodorants the morning of surgery.  Please wear clean clothes to the hospital/surgery center.  FAILURE TO FOLLOW THESE INSTRUCTIONS MAY RESULT IN THE CANCELLATION OF YOUR SURGERY   ________________________________________________________________________   Incentive Spirometer  An incentive spirometer is a tool that can help keep your lungs clear and active. This tool measures how well you are filling your lungs with each breath. Taking long deep breaths may help reverse or decrease the chance of developing breathing (pulmonary) problems (especially infection) following: A long period of time when you are unable to move or be active. BEFORE THE PROCEDURE  If the spirometer includes an indicator  to show your best effort, your nurse or respiratory therapist will set it to a desired goal. If possible, sit up straight or lean slightly forward. Try not to slouch. Hold the  incentive spirometer in an upright position. INSTRUCTIONS FOR USE  Sit on the edge of your bed if possible, or sit up as far as you can in bed or on a chair. Hold the incentive spirometer in an upright position. Breathe out normally. Place the mouthpiece in your mouth and seal your lips tightly around it. Breathe in slowly and as deeply as possible, raising the piston or the ball toward the top of the column. Hold your breath for 3-5 seconds or for as long as possible. Allow the piston or ball to fall to the bottom of the column. Remove the mouthpiece from your mouth and breathe out normally. Rest for a few seconds and repeat Steps 1 through 7 at least 10 times every 1-2 hours when you are awake. Take your time and take a few normal breaths between deep breaths. The spirometer may include an indicator to show your best effort. Use the indicator as a goal to work toward during each repetition. After each set of 10 deep breaths, practice coughing to be sure your lungs are clear. If you have an incision (the cut made at the time of surgery), support your incision when coughing by placing a pillow or rolled up towels firmly against it. Once you are able to get out of bed, walk around indoors and cough well. You may stop using the incentive spirometer when instructed by your caregiver.  RISKS AND COMPLICATIONS Take your time so you do not get dizzy or light-headed. If you are in pain, you may need to take or ask for pain medication before doing incentive spirometry. It is harder to take a deep breath if you are having pain. AFTER USE Rest and breathe slowly and easily. It can be helpful to keep track of a log of your progress. Your caregiver can provide you with a simple table to help with this. If you are using the spirometer at home, follow these instructions: [place] IF:  You are having difficultly using the spirometer. You have trouble using the spirometer as often as instructed. Your  pain medication is not giving enough relief while using the spirometer. You develop fever of 100.5 F (38.1 C) or higher. SEEK IMMEDIATE MEDICAL CARE IF:  You cough up bloody sputum that had not been present before. You develop fever of 102 F (38.9 C) or greater. You develop worsening pain at or near the incision site. MAKE SURE YOU:  Understand these instructions. Will watch your condition. Will get help right away if you are not doing well or get worse. Document Released: 12/05/2006 Document Revised: 10/17/2011 Document Reviewed: 02/05/2007 [facility] Patient Information 2014 [employer], [place].   ________________________________________________________________________ pcr

## 2021-09-21 ENCOUNTER — Other Ambulatory Visit: Payer: Self-pay

## 2021-09-21 ENCOUNTER — Encounter (HOSPITAL_COMMUNITY)
Admission: RE | Admit: 2021-09-21 | Discharge: 2021-09-21 | Disposition: A | Discharge status: Home / Self Care | Payer: Medicare Other | Source: Ambulatory Visit | Attending: Specialist | Admitting: Specialist

## 2021-09-21 ENCOUNTER — Encounter (HOSPITAL_COMMUNITY): Payer: Self-pay

## 2021-09-21 VITALS — BP 119/74 | HR 72 | Temp 98.2°F | Resp 18 | Ht 62.0 in | Wt 167.0 lb

## 2021-09-21 DIAGNOSIS — K219 Gastro-esophageal reflux disease without esophagitis: Secondary | ICD-10-CM | POA: Diagnosis not present

## 2021-09-21 DIAGNOSIS — N184 Chronic kidney disease, stage 4 (severe): Secondary | ICD-10-CM | POA: Diagnosis not present

## 2021-09-21 DIAGNOSIS — Z01818 Encounter for other preprocedural examination: Secondary | ICD-10-CM

## 2021-09-21 DIAGNOSIS — I2510 Atherosclerotic heart disease of native coronary artery without angina pectoris: Secondary | ICD-10-CM | POA: Insufficient documentation

## 2021-09-21 DIAGNOSIS — E1142 Type 2 diabetes mellitus with diabetic polyneuropathy: Secondary | ICD-10-CM | POA: Insufficient documentation

## 2021-09-21 DIAGNOSIS — Z955 Presence of coronary angioplasty implant and graft: Secondary | ICD-10-CM | POA: Insufficient documentation

## 2021-09-21 DIAGNOSIS — E1122 Type 2 diabetes mellitus with diabetic chronic kidney disease: Secondary | ICD-10-CM | POA: Diagnosis not present

## 2021-09-21 DIAGNOSIS — Z87891 Personal history of nicotine dependence: Secondary | ICD-10-CM | POA: Diagnosis not present

## 2021-09-21 DIAGNOSIS — I129 Hypertensive chronic kidney disease with stage 1 through stage 4 chronic kidney disease, or unspecified chronic kidney disease: Secondary | ICD-10-CM | POA: Diagnosis not present

## 2021-09-21 DIAGNOSIS — Z01812 Encounter for preprocedural laboratory examination: Secondary | ICD-10-CM | POA: Diagnosis not present

## 2021-09-21 DIAGNOSIS — G4733 Obstructive sleep apnea (adult) (pediatric): Secondary | ICD-10-CM | POA: Diagnosis not present

## 2021-09-21 DIAGNOSIS — M1711 Unilateral primary osteoarthritis, right knee: Secondary | ICD-10-CM | POA: Diagnosis not present

## 2021-09-21 DIAGNOSIS — E119 Type 2 diabetes mellitus without complications: Secondary | ICD-10-CM

## 2021-09-21 DIAGNOSIS — J45909 Unspecified asthma, uncomplicated: Secondary | ICD-10-CM | POA: Insufficient documentation

## 2021-09-21 HISTORY — DX: Chronic kidney disease, stage 4 (severe): N18.4

## 2021-09-21 LAB — URINALYSIS, ROUTINE W REFLEX MICROSCOPIC
Bacteria, UA: NONE SEEN
Bilirubin Urine: NEGATIVE
Glucose, UA: >=500 mg/dL — AB
Hgb urine dipstick: NEGATIVE
Ketones, ur: NEGATIVE mg/dL
Leukocytes,Ua: NEGATIVE
Nitrite: NEGATIVE
Protein, ur: NEGATIVE mg/dL
Specific Gravity, Urine: 1.014 (ref 1.005–1.030)
pH: 5 (ref 5.0–8.0)

## 2021-09-21 LAB — BASIC METABOLIC PANEL
Anion gap: 9 (ref 5–15)
BUN: 40 mg/dL — ABNORMAL HIGH (ref 8–23)
CO2: 25 mmol/L (ref 22–32)
Calcium: 8.7 mg/dL — ABNORMAL LOW (ref 8.9–10.3)
Chloride: 105 mmol/L (ref 98–111)
Creatinine, Ser: 1.91 mg/dL — ABNORMAL HIGH (ref 0.44–1.00)
GFR, Estimated: 29 mL/min — ABNORMAL LOW (ref >60)
Glucose, Bld: 99 mg/dL (ref 70–99)
Potassium: 4.4 mmol/L (ref 3.5–5.1)
Sodium: 139 mmol/L (ref 135–145)

## 2021-09-21 LAB — CBC
HCT: 35.6 % — ABNORMAL LOW (ref 36.0–46.0)
Hemoglobin: 11.1 g/dL — ABNORMAL LOW (ref 12.0–15.0)
MCH: 25.1 pg — ABNORMAL LOW (ref 26.0–34.0)
MCHC: 31.2 g/dL (ref 30.0–36.0)
MCV: 80.5 fL (ref 80.0–100.0)
Platelets: 252 10*3/uL (ref 150–400)
RBC: 4.42 MIL/uL (ref 3.87–5.11)
RDW: 14.9 % (ref 11.5–15.5)
WBC: 6.1 10*3/uL (ref 4.0–10.5)
nRBC: 0 % (ref 0.0–0.2)

## 2021-09-21 LAB — PROTIME-INR
INR: 1 (ref 0.8–1.2)
Prothrombin Time: 13.5 seconds (ref 11.4–15.2)

## 2021-09-21 LAB — GLUCOSE, CAPILLARY: Glucose-Capillary: 101 mg/dL — ABNORMAL HIGH (ref 70–99)

## 2021-09-21 LAB — HEMOGLOBIN A1C
Hgb A1c MFr Bld: 6.2 % — ABNORMAL HIGH (ref 4.8–5.6)
Mean Plasma Glucose: 131.24 mg/dL

## 2021-09-21 LAB — APTT: aPTT: 30 seconds (ref 24–36)

## 2021-09-21 LAB — SURGICAL PCR SCREEN
MRSA, PCR: NEGATIVE
Staphylococcus aureus: NEGATIVE

## 2021-09-21 NOTE — Progress Notes (Signed)
COVID test- 09/29/21 at 9:15   Bowel prep reminder:NA  PCP - Dr. Baldwin Crown Cardiologist - Dr. Corky Downs  Chest x-ray - no EKG - 08/10/21-epic Stress Test - no ECHO - 2020-epic Cardiac Cath - -2015, [place] DES  stent 2007 Pacemaker/ICD device last checked:NA  Sleep Study - yes CPAP - no  Fasting Blood Sugar - 80-95 Checks Blood Sugar BID_____ times a day  Blood Thinner Instructions:NA Aspirin Instructions: Last Dose:  Anesthesia review: yes  Patient denies shortness of breath, fever, cough and chest pain at PAT appointment Pt has no SOB with activities. Her blood sugar has been well controlled.  Patient verbalized understanding of instructions that were given to them at the PAT appointment. Patient was also instructed that they will need to review over the PAT instructions again at home before surgery. yes

## 2021-09-22 ENCOUNTER — Encounter (HOSPITAL_COMMUNITY): Payer: Self-pay

## 2021-09-22 NOTE — Progress Notes (Signed)
Anesthesia Chart Review   Case: [id number] Date/Time: 10/01/21 1300   Procedure: TOTAL KNEE ARTHROPLASTY (Right: Knee)   Anesthesia type: Spinal   Pre-op diagnosis: Right knee degenerative joint disease   Location: [facility] ROOM 07 / [facility]   Surgeons: Susa Day, MD       DISCUSSION:66 y.o. former smoker with h/o HTN, DM II, GERD, asthma, OSA, CAD (DES to RCA 2007), Stage IV CKD, right knee djd scheduled for above procedure 10/01/2021 with Dr. Susa Day.   Pt last seen by cardiology 08/10/2021. Stable at this visit without cv sx.   Per nephrology note baseline creatinine 2.   Anticipate pt can proceed with planned procedure barring acute status change.   VS: BP 119/74    Pulse 72    Temp 36.8 C (Oral)    Resp 18    Ht 5\' 2"  (1.575 m)    Wt 75.8 kg    SpO2 98%    BMI 30.54 kg/m   PROVIDERS: Reynold Bowen, MD is PCP   Shelva Majestic, MD is Cardiologist   [facility]  LABS: Labs reviewed: Acceptable for surgery. (all labs ordered are listed, but only abnormal results are displayed)  Labs Reviewed  HEMOGLOBIN A1C - Abnormal; Notable for the following components:      Result Value   Hgb A1c MFr Bld 6.2 (*)    All other components within normal limits  CBC - Abnormal; Notable for the following components:   Hemoglobin 11.1 (*)    HCT 35.6 (*)    MCH 25.1 (*)    All other components within normal limits  BASIC METABOLIC PANEL - Abnormal; Notable for the following components:   BUN 40 (*)    Creatinine, Ser 1.91 (*)    Calcium 8.7 (*)    GFR, Estimated 29 (*)    All other components within normal limits  URINALYSIS, ROUTINE W REFLEX MICROSCOPIC - Abnormal; Notable for the following components:   Glucose, UA >=500 (*)    All other components within normal limits  GLUCOSE, CAPILLARY - Abnormal; Notable for the following components:   Glucose-Capillary 101 (*)    All other components within normal limits  SURGICAL PCR SCREEN  PROTIME-INR  APTT      IMAGES:   EKG: 08/10/2021 Rate 67 bpm  NSR  CV: Echo 04/23/2019  1. The left ventricle has normal systolic function with an ejection  fraction of 60-65%. The cavity size was normal. There is mildly increased  left ventricular wall thickness. Left ventricular diastolic parameters  were normal.   2. The right ventricle has normal systolic function. The cavity was  normal.   3. The tricuspid valve is grossly normal.   4. The aortic valve has an indeterminate number of cusps. Aortic valve  regurgitation is mild by color flow Doppler. No stenosis of the aortic  valve.   5. The aorta is normal unless otherwise noted.   6. Normal LV function; mild LVH; mild AI.  Past Medical History:  Diagnosis Date   Acute bronchitis    Anemia    hx of anemia   Anginal pain (HCC)    Arthritis    osteo   Asthma    Coronary atherosclerosis    Cough    GERD (gastroesophageal reflux disease)    History of colon polyps    History of migraine    Hyperlipidemia    Hypertension    Obstructive sleep apnea (adult) (pediatric)    NO CPAP  Peripheral neuropathy    Type II or unspecified type diabetes mellitus without mention of complication, not stated as uncontrolled    type 2   Unspecified hypothyroidism     Past Surgical History:  Procedure Laterality Date   ANTERIOR FUSION CERVICAL SPINE  2009   CARPAL TUNNEL RELEASE Left    COLONOSCOPY     CORONARY STENT INTERVENTION  2007   LEFT HEART CATHETERIZATION WITH CORONARY ANGIOGRAM N/A 05/29/2014   Procedure: LEFT HEART CATHETERIZATION WITH CORONARY ANGIOGRAM;  Surgeon: Sinclair Grooms, MD;  Location: [facility] CATH LAB;  Service: Cardiovascular;  Laterality: N/A;   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   Procedure: TOTAL KNEE ARTHROPLASTY;  Surgeon: Susa Day, MD;  Location: [facility];  Service: Orthopedics;  Laterality: Left;  3 hrs   TUBAL LIGATION  1990   WISDOM TOOTH EXTRACTION      MEDICATIONS:  albuterol (PROVENTIL HFA) 108 (90  Base) MCG/ACT inhaler   Ascorbic Acid (VITAMIN C PO)   BIOTIN PO   Cyanocobalamin (B-12 PO)   Dexlansoprazole 30 MG capsule DR   diazepam (VALIUM) 5 MG tablet   diclofenac (VOLTAREN) 75 MG EC tablet   ferrous sulfate 325 (65 FE) MG tablet   Finerenone (KERENDIA) 10 MG TABS   fluticasone (FLONASE) 50 MCG/ACT nasal spray   fluticasone-salmeterol (ADVAIR HFA) 230-21 MCG/ACT inhaler   furosemide (LASIX) 40 MG tablet   insulin lispro (HUMALOG) 100 UNIT/ML KwikPen   ipratropium-albuterol (DUONEB) 0.5-2.5 (3) MG/3ML SOLN   isosorbide mononitrate (IMDUR) 30 MG 24 hr tablet   isosorbide mononitrate (IMDUR) 60 MG 24 hr tablet   JARDIANCE 10 MG TABS tablet   Lancets (ONETOUCH DELICA PLUS [id number]) MISC   levothyroxine (SYNTHROID) 150 MCG tablet   Mepolizumab (NUCALA) 100 MG/ML SOAJ   metoprolol succinate (TOPROL-XL) 25 MG 24 hr tablet   montelukast (SINGULAIR) 10 MG tablet   Multiple Vitamin (MULTIVITAMIN WITH MINERALS) TABS tablet   nitroGLYCERIN (NITROLINGUAL) 0.4 MG/SPRAY spray   NOVOFINE PLUS PEN NEEDLE 32G X 4 MM MISC   omega-3 acid ethyl esters (LOVAZA) 1 g capsule   ONE TOUCH ULTRA TEST test strip   potassium chloride SA (KLOR-CON) 20 MEQ tablet   pregabalin (LYRICA) 100 MG capsule   Pyridoxine HCl (B-6 PO)   ranolazine (RANEXA) 500 MG 12 hr tablet   Respiratory Therapy Supplies (FLUTTER) DEVI   rosuvastatin (CRESTOR) 20 MG tablet   tirzepatide (MOUNJARO) 5 MG/0.5ML Pen   traMADol (ULTRAM) 50 MG tablet   triamterene-hydrochlorothiazide (MAXZIDE-25) 37.5-25 MG tablet   verapamil (CALAN-SR) 120 MG CR tablet   No current facility-administered medications for this encounter.     Konrad Felix Ward, PA-C [facility] Pre-Surgical Testing [phone]

## 2021-09-23 ENCOUNTER — Encounter (HOSPITAL_COMMUNITY): Payer: Medicare Other

## 2021-09-24 ENCOUNTER — Other Ambulatory Visit (HOSPITAL_COMMUNITY): Payer: Self-pay

## 2021-09-27 ENCOUNTER — Other Ambulatory Visit (HOSPITAL_COMMUNITY): Payer: Self-pay

## 2021-09-27 ENCOUNTER — Ambulatory Visit: Payer: Self-pay | Admitting: Orthopedic Surgery

## 2021-09-27 NOTE — H&P (Signed)
[name] is an 66 y.o. female.   Chief Complaint: right knee pain HPI: Patient is here for her H&P. She is scheduled for a right total knee replacement by Dr. Tonita Cong on 10/01/21 at [facility].  Patient has ongoing right knee pain interfering with quality of life and activities of daily living and desires to proceed with total knee replacement.  Dr. Tonita Cong and the patient mutually agreed to proceed with a total knee replacement. Risks and benefits of the procedure were discussed including stiffness, suboptimal range of motion, persistent pain, infection requiring removal of prosthesis and reinsertion, need for prophylactic antibiotics in the future, for example, dental procedures, possible need for manipulation, revision in the future and also anesthetic complications including DVT, PE, etc. We discussed the perioperative course, time in the hospital, postoperative recovery and the need for elevation to control swelling. We also discussed the predicted range of motion and the probability that squatting and kneeling would be unobtainable in the future. In addition, postoperative anticoagulation was discussed. We have obtained preoperative medical clearance as necessary. Provided illustrated handout and discussed it in detail. They will enroll in the total joint replacement educational forum at the hospital.  Her surgery was canceled last year as her creatinine was elevated - she sees a nephrologist and her baseline creatinine is 2. She has been cleared for surgery.  Past Medical History:  Diagnosis Date   Acute bronchitis    Anemia    hx of anemia   Anginal pain (HCC)    Arthritis    osteo   Asthma    Chronic kidney disease (CKD), stage IV (severe) (HCC)    Coronary atherosclerosis    Cough    GERD (gastroesophageal reflux disease)    History of colon polyps    History of migraine    Hyperlipidemia    Hypertension    Obstructive sleep apnea (adult) (pediatric)    NO CPAP     Peripheral neuropathy    Type II or unspecified type diabetes mellitus without mention of complication, not stated as uncontrolled    type 2   Unspecified hypothyroidism     Past Surgical History:  Procedure Laterality Date   ANTERIOR FUSION CERVICAL SPINE  2009   CARPAL TUNNEL RELEASE Left    COLONOSCOPY     CORONARY STENT INTERVENTION  2007   LEFT HEART CATHETERIZATION WITH CORONARY ANGIOGRAM N/A 05/29/2014   Procedure: LEFT HEART CATHETERIZATION WITH CORONARY ANGIOGRAM;  Surgeon: Sinclair Grooms, MD;  Location: [facility] CATH LAB;  Service: Cardiovascular;  Laterality: N/A;   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   Procedure: TOTAL KNEE ARTHROPLASTY;  Surgeon: Susa Day, MD;  Location: [facility] ORS;  Service: Orthopedics;  Laterality: Left;  3 hrs   TUBAL LIGATION  1990   WISDOM TOOTH EXTRACTION      Family History  Problem Relation Age of Onset   Hypertension Mother    Heart disease Mother    Stroke Mother    Prostate cancer Father    Heart disease Brother    Heart attack Sister    Social History:  reports that she quit smoking about 18 years ago. Her smoking use included cigarettes. She has a 12.50 pack-year smoking history. She has never used smokeless tobacco. She reports that she does not drink alcohol and does not use drugs.  Allergies:  Allergies  Allergen Reactions   Allopurinol Rash    Toxic epidermal necrolysis    Codeine Other (See Comments)  REACTION: hallucinations/"loopy"   Levaquin [Levofloxacin] Nausea And Vomiting   Sulfa Antibiotics Hives   Sulfonamide Derivatives Hives    Current meds: Advair HFA 230 mcg-21 mcg/actuation aerosol inhaler amoxicillin 875 mg-potassium clavulanate 125 mg tablet Anoro Ellipta 62.5 mcg-25 mcg/actuation powder for inhalation clobetasoL 0.05 % topical gel clobetasoL 0.05 % topical ointment colchicine 0.6 mg tablet dexAMETHasone 0.5 mg/5 mL oral solution dexlansoprazole 30 mg capsule,biphase delayed release diazePAM  5 mg tablet diclofenac 1 % topical gel doxycycline hyclate 100 mg tablet DurezoL 0.05 % eye drops EPINEPHrine 0.3 mg/0.3 mL injection, auto-injector fluconazole 100 mg tablet fluticasone propionate 50 mcg/actuation nasal spray,suspension furosemide 40 mg tablet gatifloxacin 0.5 % eye drops HumaLOG KwikPen U-200 Insulin 200 unit/mL (3 mL) subcutaneous hydrocortisone 2.5 % topical ointment ipratropium 0.5 mg-albuteroL 3 mg (2.5 mg base)/3 mL nebulization soln isosorbide mononitrate ER 30 mg tablet,extended release 24 hr Jardiance 10 mg tablet Kerendia 10 mg tablet Lantus Solostar U-100 Insulin 100 unit/mL (3 mL) subcutaneous pen levothyroxine 100 mcg tablet loratadine 10 mg tablet Lyrica 100 mg capsule meclizine 25 mg tablet metoprolol succinate ER 25 mg tablet,extended release 24 hr montelukast 10 mg tablet Mounjaro 5 mg/0.5 mL subcutaneous pen injector nitroglycerin 400 mcg/spray translingual NovoFine Plus 32 gauge x 1/6" needle Nucala 100 mg/mL subcutaneous auto-injector nystatin 100,000 unit/mL oral suspension NYSTATIN 100,000 UNIT/ML SUSP omega-3 acid ethyl esters 1 gram capsule OneTouch Delica Plus Lancet 33 gauge OneTouch Verio test strips potassium chloride ER 20 mEq tablet,extended release ranolazine ER 500 mg tablet,extended release,12 hr rosuvastatin 10 mg tablet tacrolimus 1 mg capsule, immediate-release traMADoL 50 mg tablet Trelegy Ellipta 200 mcg-62.5 mcg-25 mcg powder for inhalation triamcinolone acetonide 0.1 % topical ointment triamterene 37.5 mg-hydrochlorothiazide 25 mg tablet verapamiL ER (SR) 120 mg tablet,extended release Victoza 3-Pak 0.6 mg/0.1 mL (18 mg/3 mL) subcutaneous pen injector  Review of Systems  Constitutional: Negative.   HENT: Negative.    Eyes: Negative.   Respiratory: Negative.    Cardiovascular: Negative.   Gastrointestinal: Negative.   Endocrine: Negative.   Genitourinary: Negative.   Musculoskeletal:  Positive for arthralgias,  gait problem and joint swelling.  Skin: Negative.   Psychiatric/Behavioral: Negative.     There were no vitals taken for this visit. Physical Exam Constitutional:      Appearance: Normal appearance.  HENT:     Head: Normocephalic.     Right Ear: External ear normal.     Left Ear: External ear normal.     Nose: Nose normal.     Mouth/Throat:     Pharynx: Oropharynx is clear.  Eyes:     Conjunctiva/sclera: Conjunctivae normal.  Cardiovascular:     Rate and Rhythm: Normal rate.     Pulses: Normal pulses.  Pulmonary:     Effort: Pulmonary effort is normal.     Breath sounds: Normal breath sounds.  Abdominal:     General: Bowel sounds are normal.  Musculoskeletal:     Cervical back: Normal range of motion.     Comments: Right knee tender in the medial lateral joint line patellofemoral pain compression. Range is -5-100. No DVT.  Skin:    General: Skin is warm and dry.  Neurological:     Mental Status: She is alert.    Prior right knee x-rays with end-stage right knee osteoarthritis  Assessment/Plan Impression: Right knee end-stage osteoarthritis  Plan: Pt with end-stage right knee DJD, bone-on-bone, refractory to conservative tx, scheduled for right total knee replacement by Dr. Tonita Cong on February 24. We again discussed  the procedure itself as well as risks, complications and alternatives, including but not limited to DVT, PE, infx, bleeding, failure of procedure, need for secondary procedure including manipulation, nerve injury, ongoing pain/symptoms, anesthesia risk, even stroke or death. Also discussed typical post-op protocols, activity restrictions, need for PT, flexion/extension exercises, time out of work. Discussed need for DVT ppx post-op per protocol. Discussed dental ppx and infx prevention. Also discussed limitations post-operatively such as kneeling and squatting. All questions were answered. Patient desires to proceed with surgery as scheduled.  Will hold supplements,  ASA and NSAIDs accordingly. Will remain NPO after midnight the night before surgery. Will present to [facility] for pre-op testing. Anticipate hospital stay to include at least 2 midnights given medical history and to ensure proper pain control. Plan ASA 81 mg BID for DVT ppx post-op. Plan oxycodone, Robaxin, Colace, Miralax. We were able to transition her to tramadol relatively quickly postoperatively from oxycodone previously. Plan home with HHPT post-op with family members at home for assistance then transition to outpatient PT. Will follow up 10-14 days post-op for suture removal and xrays.  Per her nephrologist her baseline creatinine is 2 and stable for surgery.  Plan Right total knee replacement  Cecilie Kicks, PA-C for Dr Tonita Cong 09/27/2021, 8:28 AM

## 2021-09-27 NOTE — H&P (View-Only) (Signed)
[name] is an 66 y.o. female.   Chief Complaint: right knee pain HPI: Patient is here for her H&P. She is scheduled for a right total knee replacement by Dr. Tonita Cong on 10/01/21 at [facility].  Patient has ongoing right knee pain interfering with quality of life and activities of daily living and desires to proceed with total knee replacement.  Dr. Tonita Cong and the patient mutually agreed to proceed with a total knee replacement. Risks and benefits of the procedure were discussed including stiffness, suboptimal range of motion, persistent pain, infection requiring removal of prosthesis and reinsertion, need for prophylactic antibiotics in the future, for example, dental procedures, possible need for manipulation, revision in the future and also anesthetic complications including DVT, PE, etc. We discussed the perioperative course, time in the hospital, postoperative recovery and the need for elevation to control swelling. We also discussed the predicted range of motion and the probability that squatting and kneeling would be unobtainable in the future. In addition, postoperative anticoagulation was discussed. We have obtained preoperative medical clearance as necessary. Provided illustrated handout and discussed it in detail. They will enroll in the total joint replacement educational forum at the hospital.  Her surgery was canceled last year as her creatinine was elevated - she sees a nephrologist and her baseline creatinine is 2. She has been cleared for surgery.  Past Medical History:  Diagnosis Date   Acute bronchitis    Anemia    hx of anemia   Anginal pain (HCC)    Arthritis    osteo   Asthma    Chronic kidney disease (CKD), stage IV (severe) (HCC)    Coronary atherosclerosis    Cough    GERD (gastroesophageal reflux disease)    History of colon polyps    History of migraine    Hyperlipidemia    Hypertension    Obstructive sleep apnea (adult) (pediatric)    NO CPAP     Peripheral neuropathy    Type II or unspecified type diabetes mellitus without mention of complication, not stated as uncontrolled    type 2   Unspecified hypothyroidism     Past Surgical History:  Procedure Laterality Date   ANTERIOR FUSION CERVICAL SPINE  2009   CARPAL TUNNEL RELEASE Left    COLONOSCOPY     CORONARY STENT INTERVENTION  2007   LEFT HEART CATHETERIZATION WITH CORONARY ANGIOGRAM N/A 05/29/2014   Procedure: LEFT HEART CATHETERIZATION WITH CORONARY ANGIOGRAM;  Surgeon: Sinclair Grooms, MD;  Location: [facility] CATH LAB;  Service: Cardiovascular;  Laterality: N/A;   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   Procedure: TOTAL KNEE ARTHROPLASTY;  Surgeon: Susa Day, MD;  Location: [facility];  Service: Orthopedics;  Laterality: Left;  3 hrs   TUBAL LIGATION  1990   WISDOM TOOTH EXTRACTION      Family History  Problem Relation Age of Onset   Hypertension Mother    Heart disease Mother    Stroke Mother    Prostate cancer Father    Heart disease Brother    Heart attack Sister    Social History:  reports that she quit smoking about 18 years ago. Her smoking use included cigarettes. She has a 12.50 pack-year smoking history. She has never used smokeless tobacco. She reports that she does not drink alcohol and does not use drugs.  Allergies:  Allergies  Allergen Reactions   Allopurinol Rash    Toxic epidermal necrolysis    Codeine Other (See Comments)  REACTION: hallucinations/"loopy"   Levaquin [Levofloxacin] Nausea And Vomiting   Sulfa Antibiotics Hives   Sulfonamide Derivatives Hives    Current meds: Advair HFA 230 mcg-21 mcg/actuation aerosol inhaler amoxicillin 875 mg-potassium clavulanate 125 mg tablet Anoro Ellipta 62.5 mcg-25 mcg/actuation powder for inhalation clobetasoL 0.05 % topical gel clobetasoL 0.05 % topical ointment colchicine 0.6 mg tablet dexAMETHasone 0.5 mg/5 mL oral solution dexlansoprazole 30 mg capsule,biphase delayed release diazePAM  5 mg tablet diclofenac 1 % topical gel doxycycline hyclate 100 mg tablet DurezoL 0.05 % eye drops EPINEPHrine 0.3 mg/0.3 mL injection, auto-injector fluconazole 100 mg tablet fluticasone propionate 50 mcg/actuation nasal spray,suspension furosemide 40 mg tablet gatifloxacin 0.5 % eye drops HumaLOG KwikPen U-200 Insulin 200 unit/mL (3 mL) subcutaneous hydrocortisone 2.5 % topical ointment ipratropium 0.5 mg-albuteroL 3 mg (2.5 mg base)/3 mL nebulization soln isosorbide mononitrate ER 30 mg tablet,extended release 24 hr Jardiance 10 mg tablet Kerendia 10 mg tablet Lantus Solostar U-100 Insulin 100 unit/mL (3 mL) subcutaneous pen levothyroxine 100 mcg tablet loratadine 10 mg tablet Lyrica 100 mg capsule meclizine 25 mg tablet metoprolol succinate ER 25 mg tablet,extended release 24 hr montelukast 10 mg tablet Mounjaro 5 mg/0.5 mL subcutaneous pen injector nitroglycerin 400 mcg/spray translingual NovoFine Plus 32 gauge x 1/6" needle Nucala 100 mg/mL subcutaneous auto-injector nystatin 100,000 unit/mL oral suspension NYSTATIN 100,000 UNIT/ML SUSP omega-3 acid ethyl esters 1 gram capsule OneTouch Delica Plus Lancet 33 gauge OneTouch Verio test strips potassium chloride ER 20 mEq tablet,extended release ranolazine ER 500 mg tablet,extended release,12 hr rosuvastatin 10 mg tablet tacrolimus 1 mg capsule, immediate-release traMADoL 50 mg tablet Trelegy Ellipta 200 mcg-62.5 mcg-25 mcg powder for inhalation triamcinolone acetonide 0.1 % topical ointment triamterene 37.5 mg-hydrochlorothiazide 25 mg tablet verapamiL ER (SR) 120 mg tablet,extended release Victoza 3-Pak 0.6 mg/0.1 mL (18 mg/3 mL) subcutaneous pen injector  Review of Systems  Constitutional: Negative.   HENT: Negative.    Eyes: Negative.   Respiratory: Negative.    Cardiovascular: Negative.   Gastrointestinal: Negative.   Endocrine: Negative.   Genitourinary: Negative.   Musculoskeletal:  Positive for arthralgias,  gait problem and joint swelling.  Skin: Negative.   Psychiatric/Behavioral: Negative.     There were no vitals taken for this visit. Physical Exam Constitutional:      Appearance: Normal appearance.  HENT:     Head: Normocephalic.     Right Ear: External ear normal.     Left Ear: External ear normal.     Nose: Nose normal.     Mouth/Throat:     Pharynx: Oropharynx is clear.  Eyes:     Conjunctiva/sclera: Conjunctivae normal.  Cardiovascular:     Rate and Rhythm: Normal rate.     Pulses: Normal pulses.  Pulmonary:     Effort: Pulmonary effort is normal.     Breath sounds: Normal breath sounds.  Abdominal:     General: Bowel sounds are normal.  Musculoskeletal:     Cervical back: Normal range of motion.     Comments: Right knee tender in the medial lateral joint line patellofemoral pain compression. Range is -5-100. No DVT.  Skin:    General: Skin is warm and dry.  Neurological:     Mental Status: She is alert.    Prior right knee x-rays with end-stage right knee osteoarthritis  Assessment/Plan Impression: Right knee end-stage osteoarthritis  Plan: Pt with end-stage right knee DJD, bone-on-bone, refractory to conservative tx, scheduled for right total knee replacement by Dr. Tonita Cong on February 24. We again discussed  the procedure itself as well as risks, complications and alternatives, including but not limited to DVT, PE, infx, bleeding, failure of procedure, need for secondary procedure including manipulation, nerve injury, ongoing pain/symptoms, anesthesia risk, even stroke or death. Also discussed typical post-op protocols, activity restrictions, need for PT, flexion/extension exercises, time out of work. Discussed need for DVT ppx post-op per protocol. Discussed dental ppx and infx prevention. Also discussed limitations post-operatively such as kneeling and squatting. All questions were answered. Patient desires to proceed with surgery as scheduled.  Will hold supplements,  ASA and NSAIDs accordingly. Will remain NPO after midnight the night before surgery. Will present to [facility] for pre-op testing. Anticipate hospital stay to include at least 2 midnights given medical history and to ensure proper pain control. Plan ASA 81 mg BID for DVT ppx post-op. Plan oxycodone, Robaxin, Colace, Miralax. We were able to transition her to tramadol relatively quickly postoperatively from oxycodone previously. Plan home with HHPT post-op with family members at home for assistance then transition to outpatient PT. Will follow up 10-14 days post-op for suture removal and xrays.  Per her nephrologist her baseline creatinine is 2 and stable for surgery.  Plan Right total knee replacement  Cecilie Kicks, PA-C for Dr Tonita Cong 09/27/2021, 8:28 AM

## 2021-09-28 ENCOUNTER — Other Ambulatory Visit: Payer: Self-pay | Admitting: Cardiovascular Disease

## 2021-09-28 DIAGNOSIS — K219 Gastro-esophageal reflux disease without esophagitis: Secondary | ICD-10-CM | POA: Diagnosis not present

## 2021-09-28 DIAGNOSIS — I2510 Atherosclerotic heart disease of native coronary artery without angina pectoris: Secondary | ICD-10-CM | POA: Diagnosis not present

## 2021-09-28 DIAGNOSIS — G4733 Obstructive sleep apnea (adult) (pediatric): Secondary | ICD-10-CM | POA: Diagnosis not present

## 2021-09-28 DIAGNOSIS — I129 Hypertensive chronic kidney disease with stage 1 through stage 4 chronic kidney disease, or unspecified chronic kidney disease: Secondary | ICD-10-CM | POA: Diagnosis not present

## 2021-09-28 DIAGNOSIS — Z955 Presence of coronary angioplasty implant and graft: Secondary | ICD-10-CM | POA: Diagnosis not present

## 2021-09-28 DIAGNOSIS — Z794 Long term (current) use of insulin: Secondary | ICD-10-CM | POA: Diagnosis not present

## 2021-09-28 DIAGNOSIS — Z7984 Long term (current) use of oral hypoglycemic drugs: Secondary | ICD-10-CM | POA: Diagnosis not present

## 2021-09-28 DIAGNOSIS — M1711 Unilateral primary osteoarthritis, right knee: Secondary | ICD-10-CM | POA: Diagnosis present

## 2021-09-28 DIAGNOSIS — E1122 Type 2 diabetes mellitus with diabetic chronic kidney disease: Secondary | ICD-10-CM | POA: Diagnosis not present

## 2021-09-28 DIAGNOSIS — E039 Hypothyroidism, unspecified: Secondary | ICD-10-CM | POA: Diagnosis not present

## 2021-09-28 DIAGNOSIS — Z79899 Other long term (current) drug therapy: Secondary | ICD-10-CM | POA: Diagnosis not present

## 2021-09-28 DIAGNOSIS — N184 Chronic kidney disease, stage 4 (severe): Secondary | ICD-10-CM | POA: Diagnosis not present

## 2021-09-28 DIAGNOSIS — J449 Chronic obstructive pulmonary disease, unspecified: Secondary | ICD-10-CM | POA: Diagnosis not present

## 2021-09-28 DIAGNOSIS — Z20822 Contact with and (suspected) exposure to covid-19: Secondary | ICD-10-CM | POA: Diagnosis not present

## 2021-09-28 DIAGNOSIS — Z87891 Personal history of nicotine dependence: Secondary | ICD-10-CM | POA: Diagnosis not present

## 2021-09-29 ENCOUNTER — Other Ambulatory Visit: Payer: Self-pay

## 2021-09-29 ENCOUNTER — Encounter (HOSPITAL_COMMUNITY)
Admission: RE | Admit: 2021-09-29 | Discharge: 2021-09-29 | Disposition: A | Discharge status: Home / Self Care | Payer: TRICARE For Life (TFL) | Source: Ambulatory Visit | Attending: Specialist | Admitting: Specialist

## 2021-09-29 LAB — SARS CORONAVIRUS 2 (TAT 6-24 HRS): SARS Coronavirus 2: NEGATIVE

## 2021-10-01 ENCOUNTER — Ambulatory Visit (HOSPITAL_BASED_OUTPATIENT_CLINIC_OR_DEPARTMENT_OTHER): Payer: Medicare Other | Admitting: Certified Registered Nurse Anesthetist

## 2021-10-01 ENCOUNTER — Other Ambulatory Visit: Payer: Self-pay

## 2021-10-01 ENCOUNTER — Ambulatory Visit (HOSPITAL_COMMUNITY)
Admission: RE | Admit: 2021-10-01 | Discharge: 2021-10-02 | Disposition: A | Discharge status: Home Health | Payer: Medicare Other | Source: Ambulatory Visit | Attending: Specialist | Admitting: Specialist

## 2021-10-01 ENCOUNTER — Encounter (HOSPITAL_COMMUNITY): Payer: Self-pay | Admitting: Specialist

## 2021-10-01 ENCOUNTER — Ambulatory Visit (HOSPITAL_COMMUNITY): Payer: Medicare Other | Admitting: Physician Assistant

## 2021-10-01 ENCOUNTER — Ambulatory Visit (HOSPITAL_COMMUNITY): Payer: Medicare Other

## 2021-10-01 ENCOUNTER — Encounter (HOSPITAL_COMMUNITY)
Admission: RE | Disposition: A | Discharge status: Home Health | Payer: Self-pay | Source: Ambulatory Visit | Attending: Specialist

## 2021-10-01 DIAGNOSIS — Z96659 Presence of unspecified artificial knee joint: Secondary | ICD-10-CM

## 2021-10-01 DIAGNOSIS — Z955 Presence of coronary angioplasty implant and graft: Secondary | ICD-10-CM | POA: Insufficient documentation

## 2021-10-01 DIAGNOSIS — Z20822 Contact with and (suspected) exposure to covid-19: Secondary | ICD-10-CM | POA: Insufficient documentation

## 2021-10-01 DIAGNOSIS — J449 Chronic obstructive pulmonary disease, unspecified: Secondary | ICD-10-CM | POA: Insufficient documentation

## 2021-10-01 DIAGNOSIS — Z79899 Other long term (current) drug therapy: Secondary | ICD-10-CM | POA: Insufficient documentation

## 2021-10-01 DIAGNOSIS — E1122 Type 2 diabetes mellitus with diabetic chronic kidney disease: Secondary | ICD-10-CM | POA: Insufficient documentation

## 2021-10-01 DIAGNOSIS — E039 Hypothyroidism, unspecified: Secondary | ICD-10-CM

## 2021-10-01 DIAGNOSIS — I2510 Atherosclerotic heart disease of native coronary artery without angina pectoris: Secondary | ICD-10-CM | POA: Insufficient documentation

## 2021-10-01 DIAGNOSIS — Z87891 Personal history of nicotine dependence: Secondary | ICD-10-CM | POA: Insufficient documentation

## 2021-10-01 DIAGNOSIS — Z794 Long term (current) use of insulin: Secondary | ICD-10-CM | POA: Insufficient documentation

## 2021-10-01 DIAGNOSIS — N184 Chronic kidney disease, stage 4 (severe): Secondary | ICD-10-CM | POA: Insufficient documentation

## 2021-10-01 DIAGNOSIS — M1711 Unilateral primary osteoarthritis, right knee: Secondary | ICD-10-CM | POA: Insufficient documentation

## 2021-10-01 DIAGNOSIS — G4730 Sleep apnea, unspecified: Secondary | ICD-10-CM | POA: Diagnosis not present

## 2021-10-01 DIAGNOSIS — G4733 Obstructive sleep apnea (adult) (pediatric): Secondary | ICD-10-CM | POA: Insufficient documentation

## 2021-10-01 DIAGNOSIS — I129 Hypertensive chronic kidney disease with stage 1 through stage 4 chronic kidney disease, or unspecified chronic kidney disease: Secondary | ICD-10-CM | POA: Insufficient documentation

## 2021-10-01 DIAGNOSIS — Z01812 Encounter for preprocedural laboratory examination: Secondary | ICD-10-CM

## 2021-10-01 DIAGNOSIS — Z7984 Long term (current) use of oral hypoglycemic drugs: Secondary | ICD-10-CM | POA: Insufficient documentation

## 2021-10-01 DIAGNOSIS — K219 Gastro-esophageal reflux disease without esophagitis: Secondary | ICD-10-CM | POA: Insufficient documentation

## 2021-10-01 HISTORY — PX: TOTAL KNEE ARTHROPLASTY: SHX125

## 2021-10-01 LAB — BASIC METABOLIC PANEL
Anion gap: 8 (ref 5–15)
BUN: 31 mg/dL — ABNORMAL HIGH (ref 8–23)
CO2: 24 mmol/L (ref 22–32)
Calcium: 8.4 mg/dL — ABNORMAL LOW (ref 8.9–10.3)
Chloride: 102 mmol/L (ref 98–111)
Creatinine, Ser: 2.16 mg/dL — ABNORMAL HIGH (ref 0.44–1.00)
GFR, Estimated: 25 mL/min — ABNORMAL LOW (ref >60)
Glucose, Bld: 79 mg/dL (ref 70–99)
Potassium: 3.9 mmol/L (ref 3.5–5.1)
Sodium: 134 mmol/L — ABNORMAL LOW (ref 135–145)

## 2021-10-01 LAB — GLUCOSE, CAPILLARY
Glucose-Capillary: 78 mg/dL (ref 70–99)
Glucose-Capillary: 68 mg/dL — ABNORMAL LOW (ref 70–99)
Glucose-Capillary: 95 mg/dL (ref 70–99)
Glucose-Capillary: 183 mg/dL — ABNORMAL HIGH (ref 70–99)

## 2021-10-01 SURGERY — ARTHROPLASTY, KNEE, TOTAL
Anesthesia: Spinal | Site: Knee | Laterality: Right

## 2021-10-01 MED ORDER — IPRATROPIUM-ALBUTEROL 0.5-2.5 (3) MG/3ML IN SOLN: 3.0000 mL | RESPIRATORY_TRACT | Status: DC | PRN

## 2021-10-01 MED ORDER — POLYETHYLENE GLYCOL 3350 17 G PO PACK
17.0000 g | PACK | Freq: Every day | ORAL | Status: DC
  Filled 2021-10-01 (×2): qty 1

## 2021-10-01 MED ORDER — TRAMADOL HCL 50 MG PO TABS
50.0000 mg | ORAL_TABLET | Freq: Three times a day (TID) | ORAL | Status: DC | PRN
  Administered 2021-10-02: 50 mg via ORAL
  Filled 2021-10-01: qty 1

## 2021-10-01 MED ORDER — CEFAZOLIN SODIUM-DEXTROSE 2-4 GM/100ML-% IV SOLN
2.0000 g | INTRAVENOUS | Status: AC
  Administered 2021-10-01: 2 g via INTRAVENOUS
  Filled 2021-10-01: qty 100

## 2021-10-01 MED ORDER — OXYCODONE HCL 5 MG PO TABS: 5.0000 mg | ORAL_TABLET | ORAL | Status: DC | PRN

## 2021-10-01 MED ORDER — BUPIVACAINE LIPOSOME 1.3 % IJ SUSP
INTRAMUSCULAR | Status: AC
  Filled 2021-10-01: qty 20

## 2021-10-01 MED ORDER — POLYETHYLENE GLYCOL 3350 17 G PO PACK: 17.0000 g | PACK | Freq: Every day | ORAL | 0 refills | Status: DC

## 2021-10-01 MED ORDER — ISOSORBIDE MONONITRATE ER 60 MG PO TB24
60.0000 mg | ORAL_TABLET | Freq: Every day | ORAL | Status: DC
Start: 2021-10-01 — End: 2021-10-02
  Administered 2021-10-02: 60 mg via ORAL
  Filled 2021-10-01 (×2): qty 1

## 2021-10-01 MED ORDER — POTASSIUM CHLORIDE CRYS ER 20 MEQ PO TBCR
20.0000 meq | EXTENDED_RELEASE_TABLET | ORAL | Status: DC
  Administered 2021-10-01: 20 meq via ORAL
  Filled 2021-10-01: qty 1

## 2021-10-01 MED ORDER — DOCUSATE SODIUM 100 MG PO CAPS
100.0000 mg | ORAL_CAPSULE | Freq: Two times a day (BID) | ORAL | Status: DC
  Administered 2021-10-01 – 2021-10-02 (×2): 100 mg via ORAL
  Filled 2021-10-01 (×2): qty 1

## 2021-10-01 MED ORDER — METHOCARBAMOL 500 MG PO TABS
500.0000 mg | ORAL_TABLET | Freq: Four times a day (QID) | ORAL | Status: DC | PRN
  Administered 2021-10-01 – 2021-10-02 (×3): 500 mg via ORAL
  Filled 2021-10-01 (×3): qty 1

## 2021-10-01 MED ORDER — EPHEDRINE SULFATE-NACL 50-0.9 MG/10ML-% IV SOSY
PREFILLED_SYRINGE | INTRAVENOUS | Status: DC | PRN
  Administered 2021-10-01: 5 mg via INTRAVENOUS

## 2021-10-01 MED ORDER — PHENYLEPHRINE HCL-NACL 20-0.9 MG/250ML-% IV SOLN
INTRAVENOUS | Status: DC | PRN
  Administered 2021-10-01: 20 ug/min via INTRAVENOUS

## 2021-10-01 MED ORDER — DOCUSATE SODIUM 100 MG PO CAPS: 100.0000 mg | ORAL_CAPSULE | Freq: Two times a day (BID) | ORAL | 1 refills | Status: DC | PRN

## 2021-10-01 MED ORDER — PRONTOSAN WOUND IRRIGATION OPTIME
TOPICAL | Status: DC | PRN
  Administered 2021-10-01: 1 via TOPICAL

## 2021-10-01 MED ORDER — PHENYLEPHRINE 40 MCG/ML (10ML) SYRINGE FOR IV PUSH (FOR BLOOD PRESSURE SUPPORT)
PREFILLED_SYRINGE | INTRAVENOUS | Status: AC
  Filled 2021-10-01: qty 10

## 2021-10-01 MED ORDER — FUROSEMIDE 40 MG PO TABS
40.0000 mg | ORAL_TABLET | Freq: Every day | ORAL | Status: DC
Start: 2021-10-01 — End: 2021-10-02
  Administered 2021-10-01 – 2021-10-02 (×2): 40 mg via ORAL
  Filled 2021-10-01 (×2): qty 1

## 2021-10-01 MED ORDER — FLEET ENEMA 7-19 GM/118ML RE ENEM: 1.0000 | ENEMA | Freq: Once | RECTAL | Status: DC | PRN

## 2021-10-01 MED ORDER — LACTATED RINGERS IV SOLN: INTRAVENOUS | Status: DC

## 2021-10-01 MED ORDER — EMPAGLIFLOZIN 10 MG PO TABS
10.0000 mg | ORAL_TABLET | Freq: Every day | ORAL | Status: DC
Start: 2021-10-01 — End: 2021-10-01

## 2021-10-01 MED ORDER — ASPIRIN EC 81 MG PO TBEC: 81.0000 mg | DELAYED_RELEASE_TABLET | Freq: Two times a day (BID) | ORAL | 1 refills | Status: DC

## 2021-10-01 MED ORDER — ONDANSETRON HCL 4 MG/2ML IJ SOLN
INTRAMUSCULAR | Status: DC | PRN
Start: 2021-10-01 — End: 2021-10-01
  Administered 2021-10-01: 4 mg via INTRAVENOUS

## 2021-10-01 MED ORDER — 0.9 % SODIUM CHLORIDE (POUR BTL) OPTIME
TOPICAL | Status: DC | PRN
Start: 2021-10-01 — End: 2021-10-01
  Administered 2021-10-01: 1000 mL

## 2021-10-01 MED ORDER — ORAL CARE MOUTH RINSE: 15.0000 mL | Freq: Once | OROMUCOSAL | Status: AC

## 2021-10-01 MED ORDER — PROPOFOL 10 MG/ML IV BOLUS
INTRAVENOUS | Status: DC | PRN
  Administered 2021-10-01 (×2): 20 mg via INTRAVENOUS
  Administered 2021-10-01: 60 mg via INTRAVENOUS
  Administered 2021-10-01: 100 mg via INTRAVENOUS

## 2021-10-01 MED ORDER — FLUTTER DEVI: 1.0000 | Freq: Every day | Status: DC

## 2021-10-01 MED ORDER — PROPOFOL 1000 MG/100ML IV EMUL
INTRAVENOUS | Status: AC
  Filled 2021-10-01: qty 100

## 2021-10-01 MED ORDER — BISACODYL 10 MG RE SUPP: 10.0000 mg | Freq: Every day | RECTAL | Status: DC | PRN

## 2021-10-01 MED ORDER — RISAQUAD PO CAPS
1.0000 | ORAL_CAPSULE | Freq: Every day | ORAL | Status: DC
  Administered 2021-10-01 – 2021-10-02 (×2): 1 via ORAL
  Filled 2021-10-01 (×2): qty 1

## 2021-10-01 MED ORDER — ACETAMINOPHEN 325 MG PO TABS: 325.0000 mg | ORAL_TABLET | Freq: Four times a day (QID) | ORAL | Status: DC | PRN

## 2021-10-01 MED ORDER — PROPOFOL 500 MG/50ML IV EMUL
INTRAVENOUS | Status: DC | PRN
  Administered 2021-10-01: 50 ug/kg/min via INTRAVENOUS

## 2021-10-01 MED ORDER — ACETAMINOPHEN 10 MG/ML IV SOLN
1000.0000 mg | INTRAVENOUS | Status: AC
  Administered 2021-10-01: 1000 mg via INTRAVENOUS
  Filled 2021-10-01: qty 100

## 2021-10-01 MED ORDER — ONDANSETRON HCL 4 MG/2ML IJ SOLN: 4.0000 mg | Freq: Four times a day (QID) | INTRAMUSCULAR | Status: DC | PRN

## 2021-10-01 MED ORDER — FLUTICASONE PROPIONATE 50 MCG/ACT NA SUSP
2.0000 | Freq: Every day | NASAL | Status: DC
  Administered 2021-10-02: 2 via NASAL
  Filled 2021-10-01: qty 16

## 2021-10-01 MED ORDER — INSULIN LISPRO (1 UNIT DIAL) 100 UNIT/ML (KWIKPEN): 5.0000 [IU] | PEN_INJECTOR | SUBCUTANEOUS | Status: DC

## 2021-10-01 MED ORDER — SODIUM CHLORIDE 0.9 % IR SOLN
Status: DC | PRN
  Administered 2021-10-01 (×2): 1000 mL

## 2021-10-01 MED ORDER — DIAZEPAM 5 MG PO TABS: 5.0000 mg | ORAL_TABLET | Freq: Every day | ORAL | Status: DC | PRN

## 2021-10-01 MED ORDER — OXYCODONE HCL 5 MG PO TABS
10.0000 mg | ORAL_TABLET | ORAL | Status: DC | PRN
  Administered 2021-10-01 – 2021-10-02 (×3): 10 mg via ORAL
  Administered 2021-10-02: 15 mg via ORAL
  Filled 2021-10-01 (×2): qty 2
  Filled 2021-10-01: qty 3
  Filled 2021-10-01: qty 2

## 2021-10-01 MED ORDER — FERROUS SULFATE 325 (65 FE) MG PO TABS
325.0000 mg | ORAL_TABLET | Freq: Every day | ORAL | Status: DC
Start: 2021-10-02 — End: 2021-10-02
  Administered 2021-10-02: 325 mg via ORAL
  Filled 2021-10-01: qty 1

## 2021-10-01 MED ORDER — VITAMIN B-12 1000 MCG PO TABS
1000.0000 ug | ORAL_TABLET | Freq: Every day | ORAL | Status: DC
  Administered 2021-10-01 – 2021-10-02 (×2): 1000 ug via ORAL
  Filled 2021-10-01 (×2): qty 1

## 2021-10-01 MED ORDER — SODIUM CHLORIDE 0.9% FLUSH
INTRAVENOUS | Status: DC | PRN
Start: 2021-10-01 — End: 2021-10-01
  Administered 2021-10-01: 40 mL

## 2021-10-01 MED ORDER — INSULIN ASPART 100 UNIT/ML IJ SOLN: 5.0000 [IU] | Freq: Three times a day (TID) | INTRAMUSCULAR | Status: DC | PRN

## 2021-10-01 MED ORDER — NITROGLYCERIN 0.4 MG/SPRAY TL SOLN
1.0000 | Status: DC | PRN
  Filled 2021-10-01: qty 12

## 2021-10-01 MED ORDER — OXYCODONE HCL 5 MG/5ML PO SOLN: 5.0000 mg | Freq: Once | ORAL | Status: DC | PRN

## 2021-10-01 MED ORDER — CEFAZOLIN SODIUM-DEXTROSE 2-4 GM/100ML-% IV SOLN
2.0000 g | Freq: Four times a day (QID) | INTRAVENOUS | Status: AC
  Administered 2021-10-01 – 2021-10-02 (×2): 2 g via INTRAVENOUS
  Filled 2021-10-01 (×2): qty 100

## 2021-10-01 MED ORDER — CHLORHEXIDINE GLUCONATE 0.12 % MT SOLN
15.0000 mL | Freq: Once | OROMUCOSAL | Status: AC
  Administered 2021-10-01: 15 mL via OROMUCOSAL

## 2021-10-01 MED ORDER — POTASSIUM CHLORIDE IN NACL 20-0.9 MEQ/L-% IV SOLN
INTRAVENOUS | Status: DC
  Filled 2021-10-01 (×3): qty 1000

## 2021-10-01 MED ORDER — HYDROMORPHONE HCL 1 MG/ML IJ SOLN
INTRAMUSCULAR | Status: DC | PRN
  Administered 2021-10-01: .5 mg via INTRAVENOUS

## 2021-10-01 MED ORDER — PHENYLEPHRINE 40 MCG/ML (10ML) SYRINGE FOR IV PUSH (FOR BLOOD PRESSURE SUPPORT)
PREFILLED_SYRINGE | INTRAVENOUS | Status: DC | PRN
Start: 2021-10-01 — End: 2021-10-01
  Administered 2021-10-01 (×3): 80 ug via INTRAVENOUS

## 2021-10-01 MED ORDER — ONDANSETRON HCL 4 MG/2ML IJ SOLN
INTRAMUSCULAR | Status: AC
  Filled 2021-10-01: qty 2

## 2021-10-01 MED ORDER — METOPROLOL SUCCINATE ER 25 MG PO TB24
25.0000 mg | ORAL_TABLET | Freq: Every day | ORAL | Status: DC
Start: 2021-10-02 — End: 2021-10-02
  Administered 2021-10-02: 25 mg via ORAL
  Filled 2021-10-01: qty 1

## 2021-10-01 MED ORDER — PANTOPRAZOLE SODIUM 40 MG PO TBEC
40.0000 mg | DELAYED_RELEASE_TABLET | Freq: Every day | ORAL | Status: DC
  Administered 2021-10-01 – 2021-10-02 (×2): 40 mg via ORAL
  Filled 2021-10-01 (×2): qty 1

## 2021-10-01 MED ORDER — HYDROMORPHONE HCL 1 MG/ML IJ SOLN: 0.2500 mg | INTRAMUSCULAR | Status: DC | PRN

## 2021-10-01 MED ORDER — FINERENONE 10 MG PO TABS: 10.0000 mg | ORAL_TABLET | Freq: Every day | ORAL | Status: DC

## 2021-10-01 MED ORDER — MENTHOL 3 MG MT LOZG: 1.0000 | LOZENGE | OROMUCOSAL | Status: DC | PRN

## 2021-10-01 MED ORDER — STERILE WATER FOR IRRIGATION IR SOLN
Status: DC | PRN
  Administered 2021-10-01: 2000 mL

## 2021-10-01 MED ORDER — FENTANYL CITRATE (PF) 100 MCG/2ML IJ SOLN
INTRAMUSCULAR | Status: DC | PRN
  Administered 2021-10-01 (×4): 25 ug via INTRAVENOUS

## 2021-10-01 MED ORDER — ALBUTEROL SULFATE (2.5 MG/3ML) 0.083% IN NEBU
2.5000 mg | INHALATION_SOLUTION | Freq: Four times a day (QID) | RESPIRATORY_TRACT | Status: DC | PRN
Start: 2021-10-01 — End: 2021-10-02

## 2021-10-01 MED ORDER — FENTANYL CITRATE PF 50 MCG/ML IJ SOSY
50.0000 ug | PREFILLED_SYRINGE | INTRAMUSCULAR | Status: DC
  Administered 2021-10-01: 50 ug via INTRAVENOUS
  Filled 2021-10-01: qty 2

## 2021-10-01 MED ORDER — MIDAZOLAM HCL 2 MG/2ML IJ SOLN: 0.5000 mg | Freq: Once | INTRAMUSCULAR | Status: DC | PRN

## 2021-10-01 MED ORDER — BUPIVACAINE-EPINEPHRINE 0.25% -1:200000 IJ SOLN
INTRAMUSCULAR | Status: DC | PRN
Start: 2021-10-01 — End: 2021-10-01
  Administered 2021-10-01: 30 mL

## 2021-10-01 MED ORDER — BUPIVACAINE-EPINEPHRINE (PF) 0.25% -1:200000 IJ SOLN
INTRAMUSCULAR | Status: AC
  Filled 2021-10-01: qty 30

## 2021-10-01 MED ORDER — MIDAZOLAM HCL 2 MG/2ML IJ SOLN
1.0000 mg | INTRAMUSCULAR | Status: DC
  Filled 2021-10-01: qty 2

## 2021-10-01 MED ORDER — DEXAMETHASONE SODIUM PHOSPHATE 10 MG/ML IJ SOLN
INTRAMUSCULAR | Status: AC
  Filled 2021-10-01: qty 1

## 2021-10-01 MED ORDER — B-12 1000 MCG PO TBCR: 1000.0000 ug | EXTENDED_RELEASE_TABLET | Freq: Every day | ORAL | Status: DC

## 2021-10-01 MED ORDER — TRANEXAMIC ACID-NACL 1000-0.7 MG/100ML-% IV SOLN
1000.0000 mg | INTRAVENOUS | Status: AC
  Administered 2021-10-01: 1000 mg via INTRAVENOUS
  Filled 2021-10-01: qty 100

## 2021-10-01 MED ORDER — ASCORBIC ACID 500 MG PO TABS
500.0000 mg | ORAL_TABLET | Freq: Every day | ORAL | Status: DC
Start: 2021-10-02 — End: 2021-10-02
  Administered 2021-10-02: 500 mg via ORAL
  Filled 2021-10-01: qty 1

## 2021-10-01 MED ORDER — ONDANSETRON HCL 4 MG PO TABS: 4.0000 mg | ORAL_TABLET | Freq: Four times a day (QID) | ORAL | Status: DC | PRN

## 2021-10-01 MED ORDER — PREGABALIN 100 MG PO CAPS
100.0000 mg | ORAL_CAPSULE | Freq: Two times a day (BID) | ORAL | Status: DC
Start: 2021-10-01 — End: 2021-10-02
  Administered 2021-10-01 – 2021-10-02 (×2): 100 mg via ORAL
  Filled 2021-10-01 (×2): qty 1

## 2021-10-01 MED ORDER — ASPIRIN 81 MG PO CHEW
81.0000 mg | CHEWABLE_TABLET | Freq: Two times a day (BID) | ORAL | Status: DC
  Administered 2021-10-02: 81 mg via ORAL
  Filled 2021-10-01: qty 1

## 2021-10-01 MED ORDER — HYDROMORPHONE HCL 1 MG/ML IJ SOLN
0.5000 mg | INTRAMUSCULAR | Status: DC | PRN
  Administered 2021-10-01 – 2021-10-02 (×2): 1 mg via INTRAVENOUS
  Filled 2021-10-01 (×2): qty 1

## 2021-10-01 MED ORDER — METHOCARBAMOL 500 MG PO TABS: 500.0000 mg | ORAL_TABLET | Freq: Three times a day (TID) | ORAL | 1 refills | Status: DC | PRN

## 2021-10-01 MED ORDER — HYDROMORPHONE HCL 2 MG/ML IJ SOLN
INTRAMUSCULAR | Status: AC
  Filled 2021-10-01: qty 1

## 2021-10-01 MED ORDER — MOMETASONE FURO-FORMOTEROL FUM 200-5 MCG/ACT IN AERO
2.0000 | INHALATION_SPRAY | Freq: Two times a day (BID) | RESPIRATORY_TRACT | Status: DC
  Administered 2021-10-02: 2 via RESPIRATORY_TRACT
  Filled 2021-10-01: qty 8.8

## 2021-10-01 MED ORDER — PHENOL 1.4 % MT LIQD: 1.0000 | OROMUCOSAL | Status: DC | PRN

## 2021-10-01 MED ORDER — EPHEDRINE 5 MG/ML INJ
INTRAVENOUS | Status: AC
  Filled 2021-10-01: qty 5

## 2021-10-01 MED ORDER — RANOLAZINE ER 500 MG PO TB12
500.0000 mg | ORAL_TABLET | Freq: Two times a day (BID) | ORAL | Status: DC
Start: 2021-10-01 — End: 2021-10-02
  Administered 2021-10-01 – 2021-10-02 (×2): 500 mg via ORAL
  Filled 2021-10-01 (×2): qty 1

## 2021-10-01 MED ORDER — SODIUM CHLORIDE (PF) 0.9 % IJ SOLN
INTRAMUSCULAR | Status: AC
  Filled 2021-10-01: qty 40

## 2021-10-01 MED ORDER — BUPIVACAINE LIPOSOME 1.3 % IJ SUSP
INTRAMUSCULAR | Status: DC | PRN
  Administered 2021-10-01: 20 mL

## 2021-10-01 MED ORDER — DEXAMETHASONE SODIUM PHOSPHATE 10 MG/ML IJ SOLN
INTRAMUSCULAR | Status: DC | PRN
  Administered 2021-10-01: 10 mg via INTRAVENOUS

## 2021-10-01 MED ORDER — OXYCODONE HCL 5 MG PO TABS: 5.0000 mg | ORAL_TABLET | Freq: Once | ORAL | Status: DC | PRN

## 2021-10-01 MED ORDER — TRIAMTERENE-HCTZ 37.5-25 MG PO TABS
1.0000 | ORAL_TABLET | Freq: Every day | ORAL | Status: DC
  Administered 2021-10-01 – 2021-10-02 (×2): 1 via ORAL
  Filled 2021-10-01 (×2): qty 1

## 2021-10-01 MED ORDER — VERAPAMIL HCL ER 120 MG PO TBCR
120.0000 mg | EXTENDED_RELEASE_TABLET | Freq: Every day | ORAL | Status: DC
  Administered 2021-10-01: 120 mg via ORAL
  Filled 2021-10-01: qty 1

## 2021-10-01 MED ORDER — METOCLOPRAMIDE HCL 5 MG PO TABS: 5.0000 mg | ORAL_TABLET | Freq: Three times a day (TID) | ORAL | Status: DC | PRN

## 2021-10-01 MED ORDER — METOCLOPRAMIDE HCL 5 MG/ML IJ SOLN: 5.0000 mg | Freq: Three times a day (TID) | INTRAMUSCULAR | Status: DC | PRN

## 2021-10-01 MED ORDER — FENTANYL CITRATE (PF) 100 MCG/2ML IJ SOLN
INTRAMUSCULAR | Status: AC
Start: 2021-10-01 — End: ?
  Filled 2021-10-01: qty 2

## 2021-10-01 MED ORDER — MONTELUKAST SODIUM 10 MG PO TABS
10.0000 mg | ORAL_TABLET | Freq: Every day | ORAL | Status: DC
Start: 2021-10-01 — End: 2021-10-02
  Administered 2021-10-01: 10 mg via ORAL
  Filled 2021-10-01: qty 1

## 2021-10-01 MED ORDER — PYRIDOXINE HCL 25 MG PO TABS
25.0000 mg | ORAL_TABLET | Freq: Every day | ORAL | Status: DC
Start: 2021-10-02 — End: 2021-10-02
  Administered 2021-10-02: 25 mg via ORAL
  Filled 2021-10-01: qty 1

## 2021-10-01 MED ORDER — ACETAMINOPHEN 500 MG PO TABS: 1000.0000 mg | ORAL_TABLET | Freq: Once | ORAL | Status: DC

## 2021-10-01 MED ORDER — ACETAMINOPHEN 500 MG PO TABS
1000.0000 mg | ORAL_TABLET | Freq: Four times a day (QID) | ORAL | Status: DC
  Administered 2021-10-01 – 2021-10-02 (×3): 1000 mg via ORAL
  Filled 2021-10-01 (×4): qty 2

## 2021-10-01 MED ORDER — METHOCARBAMOL 500 MG IVPB - SIMPLE MED
500.0000 mg | Freq: Four times a day (QID) | INTRAVENOUS | Status: DC | PRN
  Filled 2021-10-01: qty 50

## 2021-10-01 MED ORDER — LEVOTHYROXINE SODIUM 75 MCG PO TABS
150.0000 ug | ORAL_TABLET | Freq: Every day | ORAL | Status: DC
  Administered 2021-10-02: 150 ug via ORAL
  Filled 2021-10-01: qty 2

## 2021-10-01 MED ORDER — ADULT MULTIVITAMIN W/MINERALS CH
1.0000 | ORAL_TABLET | Freq: Every day | ORAL | Status: DC
  Administered 2021-10-02: 1 via ORAL
  Filled 2021-10-01: qty 1

## 2021-10-01 MED ORDER — MEPERIDINE HCL 50 MG/ML IJ SOLN: 6.2500 mg | INTRAMUSCULAR | Status: DC | PRN

## 2021-10-01 MED ORDER — OXYCODONE HCL 5 MG PO TABS
5.0000 mg | ORAL_TABLET | ORAL | 0 refills | Status: DC | PRN
Start: 2021-10-01 — End: 2023-09-05

## 2021-10-01 MED ORDER — ISOSORBIDE MONONITRATE ER 30 MG PO TB24
30.0000 mg | ORAL_TABLET | Freq: Every day | ORAL | Status: DC
Start: 2021-10-01 — End: 2021-10-01

## 2021-10-01 MED ORDER — B-6 250 MG PO TABS
ORAL_TABLET | Freq: Every day | ORAL | Status: DC
Start: 2021-10-01 — End: 2021-10-01

## 2021-10-01 MED ORDER — ALBUTEROL SULFATE HFA 108 (90 BASE) MCG/ACT IN AERS: 2.0000 | INHALATION_SPRAY | Freq: Four times a day (QID) | RESPIRATORY_TRACT | Status: DC | PRN

## 2021-10-01 SURGICAL SUPPLY — 81 items
AGENT HMST SPONGE THK3/8 (HEMOSTASIS)
ATTUNE PS FEM RT SZ 4 CEM KNEE (Femur) ×1 IMPLANT
ATTUNE PSRP INSR SZ4 5 KNEE (Insert) ×1 IMPLANT
BAG COUNTER SPONGE SURGICOUNT (BAG) ×1 IMPLANT
BAG DECANTER FOR FLEXI CONT (MISCELLANEOUS) ×2 IMPLANT
BAG SPEC THK2 15X12 ZIP CLS (MISCELLANEOUS) ×1
BAG SPNG CNTER NS LX DISP (BAG) ×1
BAG ZIPLOCK 12X15 (MISCELLANEOUS) ×1 IMPLANT
BASE TIBIAL ROT PLAT SZ 5 KNEE (Knees) IMPLANT
BLADE SAW SGTL 11.0X1.19X90.0M (BLADE) ×2 IMPLANT
BLADE SAW SGTL 13.0X1.19X90.0M (BLADE) ×2 IMPLANT
BLADE SURG SZ10 CARB STEEL (BLADE) ×4 IMPLANT
BNDG COHESIVE 4X5 TAN ST LF (GAUZE/BANDAGES/DRESSINGS) ×2 IMPLANT
BNDG ELASTIC 4X5.8 VLCR STR LF (GAUZE/BANDAGES/DRESSINGS) ×2 IMPLANT
BNDG ELASTIC 6X5.8 VLCR STR LF (GAUZE/BANDAGES/DRESSINGS) ×2 IMPLANT
BOWL SMART MIX CTS (DISPOSABLE) ×2 IMPLANT
BSPLAT TIB 5 CMNT ROT PLAT STR (Knees) ×1 IMPLANT
CEMENT HV SMART SET (Cement) ×4 IMPLANT
CLSR STERI-STRIP ANTIMIC 1/2X4 (GAUZE/BANDAGES/DRESSINGS) ×1 IMPLANT
COVER SURGICAL LIGHT HANDLE (MISCELLANEOUS) ×2 IMPLANT
CUFF TOURN SGL QUICK 34 (TOURNIQUET CUFF) ×2
CUFF TRNQT CYL 34X4.125X (TOURNIQUET CUFF) ×1 IMPLANT
DRAPE INCISE IOBAN 66X45 STRL (DRAPES) ×2 IMPLANT
DRAPE ORTHO SPLIT 77X108 STRL (DRAPES) ×4
DRAPE SHEET LG 3/4 BI-LAMINATE (DRAPES) ×4 IMPLANT
DRAPE SURG ORHT 6 SPLT 77X108 (DRAPES) ×2 IMPLANT
DRAPE U-SHAPE 47X51 STRL (DRAPES) ×2 IMPLANT
DRSG AQUACEL AG ADV 3.5X10 (GAUZE/BANDAGES/DRESSINGS) ×2 IMPLANT
DRSG TEGADERM 4X4.75 (GAUZE/BANDAGES/DRESSINGS) IMPLANT
DURAPREP 26ML APPLICATOR (WOUND CARE) ×2 IMPLANT
ELECT BLADE TIP CTD 4 INCH (ELECTRODE) ×2 IMPLANT
ELECT REM PT RETURN 15FT ADLT (MISCELLANEOUS) ×2 IMPLANT
EVACUATOR 1/8 PVC DRAIN (DRAIN) IMPLANT
GAUZE SPONGE 2X2 8PLY STRL LF (GAUZE/BANDAGES/DRESSINGS) IMPLANT
GLOVE SRG 8 PF TXTR STRL LF DI (GLOVE) ×1 IMPLANT
GLOVE SURG POLYISO LF SZ7.5 (GLOVE) ×4 IMPLANT
GLOVE SURG POLYISO LF SZ8 (GLOVE) ×4 IMPLANT
GLOVE SURG UNDER POLY LF SZ7.5 (GLOVE) ×2 IMPLANT
GLOVE SURG UNDER POLY LF SZ8 (GLOVE) ×2
GOWN STRL REUS W/TWL XL LVL3 (GOWN DISPOSABLE) ×4 IMPLANT
HANDPIECE INTERPULSE COAX TIP (DISPOSABLE) ×2
HEMOSTAT SPONGE AVITENE ULTRA (HEMOSTASIS) IMPLANT
HOLDER FOLEY CATH W/STRAP (MISCELLANEOUS) ×1 IMPLANT
IMMOBILIZER KNEE 20 (SOFTGOODS) ×2
IMMOBILIZER KNEE 20 THIGH 36 (SOFTGOODS) ×1 IMPLANT
JET LAVAGE IRRISEPT WOUND (IRRIGATION / IRRIGATOR) ×2
KIT TURNOVER KIT A (KITS) ×1 IMPLANT
LAVAGE JET IRRISEPT WOUND (IRRIGATION / IRRIGATOR) ×1 IMPLANT
MANIFOLD NEPTUNE II (INSTRUMENTS) ×2 IMPLANT
NDL SAFETY ECLIPSE 18X1.5 (NEEDLE) IMPLANT
NEEDLE HYPO 18GX1.5 SHARP (NEEDLE)
NS IRRIG 1000ML POUR BTL (IV SOLUTION) IMPLANT
PACK TOTAL KNEE CUSTOM (KITS) ×2 IMPLANT
PATELLA MEDIAL ATTUN 35MM KNEE (Knees) ×1 IMPLANT
PROTECTOR NERVE ULNAR (MISCELLANEOUS) ×2 IMPLANT
SAW OSC TIP CART 19.5X105X1.3 (SAW) ×2 IMPLANT
SEALER BIPOLAR AQUA 6.0 (INSTRUMENTS) ×2 IMPLANT
SET HNDPC FAN SPRY TIP SCT (DISPOSABLE) ×1 IMPLANT
SPIKE FLUID TRANSFER (MISCELLANEOUS) ×2 IMPLANT
SPONGE GAUZE 2X2 STER 10/PKG (GAUZE/BANDAGES/DRESSINGS)
SPONGE SURGIFOAM ABS GEL 100 (HEMOSTASIS) IMPLANT
SPONGE T-LAP 18X18 ~~LOC~~+RFID (SPONGE) ×6 IMPLANT
STAPLER VISISTAT (STAPLE) IMPLANT
STRIP CLOSURE SKIN 1/2X4 (GAUZE/BANDAGES/DRESSINGS) IMPLANT
SUT BONE WAX W31G (SUTURE) ×2 IMPLANT
SUT MNCRL AB 4-0 PS2 18 (SUTURE) IMPLANT
SUT STRATAFIX 0 PDS 27 VIOLET (SUTURE) ×2
SUT VIC AB 1 CT1 27 (SUTURE) ×4
SUT VIC AB 1 CT1 27XBRD ANTBC (SUTURE) ×2 IMPLANT
SUT VIC AB 1 CTX 36 (SUTURE)
SUT VIC AB 1 CTX36XBRD ANBCTR (SUTURE) IMPLANT
SUT VIC AB 2-0 CT1 27 (SUTURE) ×6
SUT VIC AB 2-0 CT1 TAPERPNT 27 (SUTURE) ×3 IMPLANT
SUTURE STRATFX 0 PDS 27 VIOLET (SUTURE) ×1 IMPLANT
SYR 3ML LL SCALE MARK (SYRINGE) IMPLANT
SYR 50ML LL SCALE MARK (SYRINGE) IMPLANT
TIBIAL BASE ROT PLAT SZ 5 KNEE (Knees) ×2 IMPLANT
TRAY FOLEY MTR SLVR 16FR STAT (SET/KITS/TRAYS/PACK) ×2 IMPLANT
WATER STERILE IRR 1000ML POUR (IV SOLUTION) ×2 IMPLANT
WIPE CHG CHLORHEXIDINE 2% (PERSONAL CARE ITEMS) ×2 IMPLANT
WRAP KNEE MAXI GEL POST OP (GAUZE/BANDAGES/DRESSINGS) ×2 IMPLANT

## 2021-10-01 NOTE — Anesthesia Procedure Notes (Signed)
Spinal  Patient location during procedure: OR End time: 10/01/2021 1:31 PM Reason for block: surgical anesthesia Staffing Performed: anesthesiologist  Anesthesiologist: Annye Asa, MD Preanesthetic Checklist Completed: patient identified, IV checked, site marked, risks and benefits discussed, surgical consent, monitors and equipment checked, pre-op evaluation and timeout performed Spinal Block Patient position: sitting Prep: DuraPrep and site prepped and draped Patient monitoring: blood pressure, continuous pulse ox, cardiac monitor and heart rate Approach: midline Location: L3-4 Injection technique: single-shot Needle Needle type: Pencan and Introducer  Needle gauge: 24 G Needle length: 9 cm Assessment Events: CSF return Additional Notes Pt identified in Operating room.  Monitors applied. Working IV access confirmed. Sterile prep, drape lumbar spine.  1% lido local L 3,4.  #24ga Pencan easily into clear CSF L 3,4.  12mg  0.75% Bupivacaine with dextrose injected with asp CSF beginning and end of injection.  Patient asymptomatic, VSS, no heme aspirated, tolerated well.  Jenita Seashore, MD

## 2021-10-01 NOTE — Anesthesia Procedure Notes (Signed)
Procedure Name: LMA Insertion Date/Time: 10/01/2021 1:47 PM Performed by: Deliah Boston, CRNA Pre-anesthesia Checklist: Patient identified, Emergency Drugs available, Suction available and Patient being monitored Patient Re-evaluated:Patient Re-evaluated prior to induction Oxygen Delivery Method: Circle system utilized Preoxygenation: Pre-oxygenation with 100% oxygen Induction Type: IV induction LMA: LMA inserted LMA Size: 4.0 Number of attempts: 1 Placement Confirmation: positive ETCO2 and breath sounds checked- equal and bilateral Tube secured with: Tape Dental Injury: Teeth and Oropharynx as per pre-operative assessment

## 2021-10-01 NOTE — Progress Notes (Signed)
AssistedDr. Carswell Jackson with right, ultrasound guided, adductor canal block. Side rails up, monitors on throughout procedure. See vital signs in flow sheet. Tolerated Procedure well.  

## 2021-10-01 NOTE — Brief Op Note (Signed)
10/01/2021  1:25 PM  PATIENT:  [name]  66 y.o. female  PRE-OPERATIVE DIAGNOSIS:  Right knee degenerative joint disease  POST-OPERATIVE DIAGNOSIS:  *Same  PROCEDURE:  Procedure(s): TOTAL KNEE ARTHROPLASTY (Right)  SURGEON:  Surgeon(s) and Role:    Susa Day, MD - Primary  PHYSICIAN ASSISTANT:   ASSISTANTS: Bissell   ANESTHESIA:   spinal  EBL:  50   BLOOD ADMINISTERED:none  DRAINS: none   LOCAL MEDICATIONS USED:  MARCAINE     SPECIMEN:  No Specimen  DISPOSITION OF SPECIMEN:  N/A  COUNTS:  YES  TOURNIQUET:  41min  DICTATION: .Other Dictation: Dictation Number   [id number]  PLAN OF CARE: Admit for overnight observation  PATIENT DISPOSITION:  PACU - hemodynamically stable.   Delay start of Pharmacological VTE agent (>24hrs) due to surgical blood loss or risk of bleeding: no

## 2021-10-01 NOTE — Anesthesia Preprocedure Evaluation (Addendum)
Anesthesia Evaluation  Patient identified by MRN, date of birth, ID band Patient awake    Reviewed: Allergy & Precautions, NPO status , Patient's Chart, lab work & pertinent test results, reviewed documented beta blocker date and time   History of Anesthesia Complications Negative for: history of anesthetic complications  Airway Mallampati: II  TM Distance: >3 FB Neck ROM: Full    Dental  (+) Poor Dentition, Missing, Dental Advisory Given   Pulmonary sleep apnea (does not use CPAP) , COPD,  COPD inhaler, former smoker,  09/28/2021 SARS coronavirus NEG   breath sounds clear to auscultation       Cardiovascular hypertension, Pt. on medications and Pt. on home beta blockers (-) angina+ CAD and + Cardiac Stents   Rhythm:Regular Rate:Normal  '20 ECHO: normal LVF with EF 60-65%. The cavity size was normal, mildly increased left ventricular wall thickness. Left ventricular diastolic parameters were normal. Normal RVF, mild AI   Neuro/Psych    GI/Hepatic Neg liver ROS, GERD  Medicated and Controlled,  Endo/Other  diabetes (glu 78), Insulin Dependent, Oral Hypoglycemic AgentsHypothyroidism obese  Renal/GU Renal InsufficiencyRenal disease     Musculoskeletal   Abdominal (+) + obese,   Peds  Hematology   Anesthesia Other Findings   Reproductive/Obstetrics                           Anesthesia Physical Anesthesia Plan  ASA: 3  Anesthesia Plan: Spinal   Post-op Pain Management: Regional block* and Tylenol PO (pre-op)*   Induction:   PONV Risk Score and Plan: 2 and Ondansetron and Treatment may vary due to age or medical condition  Airway Management Planned: Natural Airway and Simple Face Mask  Additional Equipment: None  Intra-op Plan:   Post-operative Plan:   Informed Consent: I have reviewed the patients History and Physical, chart, labs and discussed the procedure including the risks,  benefits and alternatives for the proposed anesthesia with the patient or authorized representative who has indicated his/her understanding and acceptance.     Dental advisory given  Plan Discussed with: CRNA and Surgeon  Anesthesia Plan Comments: (Plan routine monitors, SAB with adductor canal block for post op analgesia)      Anesthesia Quick Evaluation

## 2021-10-01 NOTE — Interval H&P Note (Signed)
History and Physical Interval Note:  10/01/2021 12:59 PM  [name]  has presented today for surgery, with the diagnosis of Right knee degenerative joint disease.  The various methods of treatment have been discussed with the patient and family. After consideration of risks, benefits and other options for treatment, the patient has consented to  Procedure(s): TOTAL KNEE ARTHROPLASTY (Right) as a surgical intervention.  The patient's history has been reviewed, patient examined, no change in status, stable for surgery.  I have reviewed the patient's chart and labs.  Questions were answered to the patient's satisfaction.     Johnn Hai

## 2021-10-01 NOTE — Anesthesia Procedure Notes (Signed)
Anesthesia Regional Block: Adductor canal block   Pre-Anesthetic Checklist: , timeout performed,  Correct Patient, Correct Site, Correct Laterality,  Correct Procedure, Correct Position, site marked,  Risks and benefits discussed,  Surgical consent,  Pre-op evaluation,  At surgeon's request and post-op pain management  Laterality: Right and Lower  Prep: chloraprep       Needles:  Injection technique: Single-shot  Needle Type: Echogenic Needle     Needle Length: 9cm  Needle Gauge: 21     Additional Needles:   Procedures:,,,, ultrasound used (permanent image in chart),,    Narrative:  Start time: 10/01/2021 1:00 PM End time: 10/01/2021 1:06 PM Injection made incrementally with aspirations every 5 mL.  Performed by: Personally  Anesthesiologist: Annye Asa, MD  Additional Notes: Pt identified in Holding room.  Monitors applied. Working IV access confirmed. Sterile prep R thigh.  #21ga ECHOgenic Arrow block needle into adductor canal with US guidance.  20cc 0.75% Ropivacaine injected incrementally after negative test dose.  Patient asymptomatic, VSS, no heme aspirated, tolerated well.   Jenita Seashore, MD

## 2021-10-01 NOTE — Transfer of Care (Signed)
Immediate Anesthesia Transfer of Care Note  Patient: [name]  Procedure(s) Performed: TOTAL KNEE ARTHROPLASTY (Right: Knee)  Patient Location: PACU  Anesthesia Type:GA combined with regional for post-op pain  Level of Consciousness: drowsy  Airway & Oxygen Therapy: Patient Spontanous Breathing and Patient connected to face mask oxygen  Post-op Assessment: Report given to RN and Post -op Vital signs reviewed and stable  Post vital signs: Reviewed and stable  Last Vitals:  Vitals Value Taken Time  BP 140/82 10/01/21 1607  Temp    Pulse 86 10/01/21 1609  Resp 14 10/01/21 1609  SpO2 95 % 10/01/21 1609  Vitals shown include unvalidated device data.  Last Pain:  Vitals:   10/01/21 1310  TempSrc:   PainSc: 0-No pain         Complications: No notable events documented.

## 2021-10-01 NOTE — Progress Notes (Signed)
Pharmacy: [facility] ordered for inpatient.  Hold criteria for  empagliflozin: Acute renal failure eGFR < 30 mL/min/1.55m (empagliflozin JARDIANCE, dapagliflozin FARXIGA) --> pt's eGFR ~25 Diabetic ketoacidosis Metabolic acidosis NPO status UTI Dehydration Volume depletion   Plan: - hold Jardiance per P&T policy  ADia Sitter PharmD, BCPS 10/01/2021 5:56 PM

## 2021-10-01 NOTE — Op Note (Signed)
[name], [name] MEDICAL RECORD NO: [id number] ACCOUNT NO: [id number] DATE OF BIRTH: 03-28-1956 FACILITY: [facility] LOCATION: [id number] PHYSICIAN: Johnn Hai, MD  Operative Report   DATE OF PROCEDURE: 10/01/2021  PREOPERATIVE DIAGNOSIS:  End-stage osteoarthrosis of the right knee.  POSTOPERATIVE DIAGNOSIS:  End-stage osteoarthrosis of the right knee.  PROCEDURE PERFORMED:  Right total knee arthroplasty utilizing DePuy Attune rotating platform 4 femur, 4 tibia, 5 mm insert, 35 patella.  ANESTHESIA:  General.  ASSISTANT:  Lacie Draft, PA.  HISTORY:  A 66 with end-stage osteoarthrosis, tricompartmental, refractory to conservative treatment, indicated for replacement of the degenerated joint.  Risks and benefits discussed including bleeding, infection, damage to neurovascular structures,  suboptimal range of motion, DVT, PE, anesthetic complications, etc.  DESCRIPTION OF PROCEDURE:  The patient in supine position, after induction of adequate general anesthesia, 2 grams Kefzol, the right lower extremity was prepped and draped and exsanguinated in the usual sterile fashion.  Thigh tourniquet inflated to 225  mmHg.  Midline incision was then made over the knee.  Full thickness flaps developed.  Median parapatellar arthrotomy performed.  Soft tissue elevated medially, preserving the MCL.  Patella was gently everted, knee was flexed.  Tricompartmental  osteoarthrosis was noted.  Bone-on-bone medial and lateral compartment and the patellofemoral joint.  Leksell rongeur utilized to remove osteophytes.  Remnants of medial and lateral menisci and the ACL was removed as well.  Step drill was utilized to  enter the femoral canal, it was then irrigated, T-handle and 5-degree right with 10 off the distal femur due to a slight flexion contracture.  This was then pinned and the distal femoral jig was placed and I performed a distal femoral cut.  I then sized  it off the anterior cortex to a 4 with 3  degrees of external rotation.  This was pinned, cutting block was placed.  Anterior, posterior and chamfer cuts were then performed.  Soft tissues protected posteriorly at all times.  The patient had soft bone.   Next, I subluxed the tibia, both eburnated bone medially and laterally.  A low point was posteromedially.  External alignment guide parallel to the shaft bisecting the tibiotalar joint 3-degree slope.  This was then pinned.  I performed a proximal tibial  cut with soft tissues protected at all times.  This was a 9 off the high side laterally, anteriorly.  Next, I trialed a 5 insert extension block and it was full extension and the flexion block was equivalent.  I then subluxed the tibia, maximized the  coverage of the proximal tibia with a 4.  Distal medial aspect of the middle third of the tibial tubercle, this was then pinned.  I harvested bone centrally and impacted it in distal femur.  I drilled centrally, used our punch guide.  Then, I used a box  cutting guide for the femur.  Bisected the canal and we performed our box cut.  I then placed a trial femur.  It sat flush.  I drilled our lug holes.  I then placed a 5 insert and reduced it and had full extension, full flexion, good stability to varus  valgus stressing at 0-30 degrees, negative anterior drawer.  Next, I everted the patella.  This was measured to a 21.  I planed it to a 15, utilizing the patellar jig.  I then sized it to a 35 with a trial paddle parallel to the joint surface, I drilled  our peg holes after medializing them.  I placed a  trial patella and reduced and had excellent patellofemoral tracking.  All trial was then removed and checked posteriorly, popliteus and the capsule was intact.  I cauterized geniculates.  A loose body was  removed posteriorly and laterally.  This measured a centimeter in diameter.  I then used pulsatile lavage to clean the joint.  I then flexed the knee, everted the patella.  All surfaces thoroughly  dried.  Mixed cement on back table in the appropriate  fashion.  I then after drying all surfaces, I placed cement in the tibial canal, digitally pressurizing it and placed cement on the tibial tray and then impacted that into place with cement on the tibia as well.  I cemented and impacted the femur, cement  on both sides as well.  I placed a 5 insert, reduced it, held axial load throughout the curing of the cement.  I then cemented and clamped the patella.  A 0.25% Marcaine with epinephrine was placed in the joint and then some Prontosan while the cement  cured, the knee in full extension with an axial load applied.  After appropriate curing of the cement, tourniquet was deflated approximately 65 minutes.  There was minor bleeding, which was cauterized with an Aquamantys.  I selected a 5 insert.  I  removed the trial insert and meticulously removed all redundant cement.  This posteriorly as well.  Small loose body noted.  Copiously irrigated with pulsatile lavage, used Aquamantys to the geniculates and posteriorly.  I then irrigated with Prontosan  and placed a 5 permanent insert, had reduced it with full extension, full flexion, good stability to varus valgus stressing at 0-30 degrees, negative anterior drawer.  I then placed the knee in slight flexion and repaired the parapatellar arthrotomy with  1 Vicryl interrupted figure-of-eight sutures, subcutaneous with 2-0 and skin with subcuticular Monocryl.  After this had excellent patellofemoral tracking and flexion to gravity at 90 degrees.  Sterile dressing applied, placed in immobilizer and  transported to the recovery room in satisfactory condition.  The patient tolerated the procedure well.  No complications.    Assistant, Lacie Draft, [place].    Minimal blood loss, 50 mL.   CHR D: 10/01/2021 3:42:25 pm T: 10/01/2021 10:01:00 pm  JOB: [id number]/ [id number]

## 2021-10-01 NOTE — Anesthesia Procedure Notes (Addendum)
Procedure Name: MAC Date/Time: 10/01/2021 1:25 PM Performed by: Deliah Boston, CRNA Pre-anesthesia Checklist: Patient identified, Emergency Drugs available, Suction available and Patient being monitored Patient Re-evaluated:Patient Re-evaluated prior to induction Oxygen Delivery Method: Simple face mask Preoxygenation: Pre-oxygenation with 100% oxygen Induction Type: IV induction Placement Confirmation: positive ETCO2 and breath sounds checked- equal and bilateral

## 2021-10-01 NOTE — Discharge Instructions (Signed)
Elevate leg above heart 6x a day for 20minutes each Use knee immobilizer while walking until can SLR x 10 Use knee immobilizer in bed to keep knee in extension Aquacel dressing may remain in place until follow up. May shower with aquacel dressing in place. If the dressing becomes saturated or peels off, you may remove aquacel dressing. Do not remove steri-strips if they are present. Place new dressing with gauze and tape or ACE bandage which should be kept clean and dry and changed daily.   INSTRUCTIONS AFTER JOINT REPLACEMENT   Remove items at home which could result in a fall. This includes throw rugs or furniture in walking pathways ICE to the affected joint every three hours while awake for 30 minutes at a time, for at least the first 3-5 days, and then as needed for pain and swelling.  Continue to use ice for pain and swelling. You may notice swelling that will progress down to the foot and ankle.  This is normal after surgery.  Elevate your leg when you are not up walking on it.   Continue to use the breathing machine you got in the hospital (incentive spirometer) which will help keep your temperature down.  It is common for your temperature to cycle up and down following surgery, especially at night when you are not up moving around and exerting yourself.  The breathing machine keeps your lungs expanded and your temperature down.   DIET:  As you were doing prior to hospitalization, we recommend a well-balanced diet.  DRESSING / WOUND CARE / SHOWERING  Keep the surgical dressing until follow up.  The dressing is water proof, so you can shower without any extra covering.  IF THE DRESSING FALLS OFF or the wound gets wet inside, change the dressing with sterile gauze.  Please use good hand washing techniques before changing the dressing.  Do not use any lotions or creams on the incision until instructed by your surgeon.    ACTIVITY  Increase activity slowly as tolerated, but follow the weight  bearing instructions below.   No driving for 6 weeks or until further direction given by your physician.  You cannot drive while taking narcotics.  No lifting or carrying greater than 10 lbs. until further directed by your surgeon. Avoid periods of inactivity such as sitting longer than an hour when not asleep. This helps prevent blood clots.  You may return to work once you are authorized by your doctor.     WEIGHT BEARING   Weight bearing as tolerated with assist device (walker, cane, etc) as directed, use it as long as suggested by your surgeon or therapist, typically at least 4-6 weeks.   EXERCISES  Results after joint replacement surgery are often greatly improved when you follow the exercise, range of motion and muscle strengthening exercises prescribed by your doctor. Safety measures are also important to protect the joint from further injury. Any time any of these exercises cause you to have increased pain or swelling, decrease what you are doing until you are comfortable again and then slowly increase them. If you have problems or questions, call your caregiver or physical therapist for advice.   Rehabilitation is important following a joint replacement. After just a few days of immobilization, the muscles of the leg can become weakened and shrink (atrophy).  These exercises are designed to build up the tone and strength of the thigh and leg muscles and to improve motion. Often times heat used for twenty to thirty   minutes before working out will loosen up your tissues and help with improving the range of motion but do not use heat for the first two weeks following surgery (sometimes heat can increase post-operative swelling).   These exercises can be done on a training (exercise) mat, on the floor, on a table or on a bed. Use whatever works the best and is most comfortable for you.    Use music or television while you are exercising so that the exercises are a pleasant break in your day.  This will make your life better with the exercises acting as a break in your routine that you can look forward to.   Perform all exercises about fifteen times, three times per day or as directed.  You should exercise both the operative leg and the other leg as well.  Exercises include:   Quad Sets - Tighten up the muscle on the front of the thigh (Quad) and hold for 5-10 seconds.   Straight Leg Raises - With your knee straight (if you were given a brace, keep it on), lift the leg to 60 degrees, hold for 3 seconds, and slowly lower the leg.  Perform this exercise against resistance later as your leg gets stronger.  Leg Slides: Lying on your back, slowly slide your foot toward your buttocks, bending your knee up off the floor (only go as far as is comfortable). Then slowly slide your foot back down until your leg is flat on the floor again.  Angel Wings: Lying on your back spread your legs to the side as far apart as you can without causing discomfort.  Hamstring Strength:  Lying on your back, push your heel against the floor with your leg straight by tightening up the muscles of your buttocks.  Repeat, but this time bend your knee to a comfortable angle, and push your heel against the floor.  You may put a pillow under the heel to make it more comfortable if necessary.   A rehabilitation program following joint replacement surgery can speed recovery and prevent re-injury in the future due to weakened muscles. Contact your doctor or a physical therapist for more information on knee rehabilitation.    CONSTIPATION  Constipation is defined medically as fewer than three stools per week and severe constipation as less than one stool per week.  Even if you have a regular bowel pattern at home, your normal regimen is likely to be disrupted due to multiple reasons following surgery.  Combination of anesthesia, postoperative narcotics, change in appetite and fluid intake all can affect your bowels.   YOU MUST  use at least one of the following options; they are listed in order of increasing strength to get the job done.  They are all available over the counter, and you may need to use some, POSSIBLY even all of these options:    Drink plenty of fluids (prune juice may be helpful) and high fiber foods Colace 100 mg by mouth twice a day  Senokot for constipation as directed and as needed Dulcolax (bisacodyl), take with full glass of water  Miralax (polyethylene glycol) once or twice a day as needed.  If you have tried all these things and are unable to have a bowel movement in the first 3-4 days after surgery call either your surgeon or your primary doctor.    If you experience loose stools or diarrhea, hold the medications until you stool forms back up.  If your symptoms do not get better within   1 week or if they get worse, check with your doctor.  If you experience "the worst abdominal pain ever" or develop nausea or vomiting, please contact the office immediately for further recommendations for treatment.   ITCHING:  If you experience itching with your medications, try taking only a single pain pill, or even half a pain pill at a time.  You can also use Benadryl over the counter for itching or also to help with sleep.   TED HOSE STOCKINGS:  Use stockings on both legs until for at least 2 weeks or as directed by physician office. They may be removed at night for sleeping.  MEDICATIONS:  See your medication summary on the "After Visit Summary" that nursing will review with you.  You may have some home medications which will be placed on hold until you complete the course of blood thinner medication.  It is important for you to complete the blood thinner medication as prescribed.  PRECAUTIONS:  If you experience chest pain or shortness of breath - call 911 immediately for transfer to the hospital emergency department.   If you develop a fever greater that 101 F, purulent drainage from wound, increased  redness or drainage from wound, foul odor from the wound/dressing, or calf pain - CONTACT YOUR SURGEON.                                                   FOLLOW-UP APPOINTMENTS:  If you do not already have a post-op appointment, please call the office for an appointment to be seen by your surgeon.  Guidelines for how soon to be seen are listed in your "After Visit Summary", but are typically between 1-4 weeks after surgery.  OTHER INSTRUCTIONS:   Knee Replacement:  Do not place pillow under knee, focus on keeping the knee straight while resting. CPM instructions: 0-90 degrees, 2 hours in the morning, 2 hours in the afternoon, and 2 hours in the evening. Place foam block, curve side up under heel at all times except when in CPM or when walking.  DO NOT modify, tear, cut, or change the foam block in any way.  POST-OPERATIVE OPIOID TAPER INSTRUCTIONS: It is important to wean off of your opioid medication as soon as possible. If you do not need pain medication after your surgery it is ok to stop day one. Opioids include: Codeine, Hydrocodone(Norco, Vicodin), Oxycodone(Percocet, oxycontin) and hydromorphone amongst others.  Long term and even short term use of opiods can cause: Increased pain response Dependence Constipation Depression Respiratory depression And more.  Withdrawal symptoms can include Flu like symptoms Nausea, vomiting And more Techniques to manage these symptoms Hydrate well Eat regular healthy meals Stay active Use relaxation techniques(deep breathing, meditating, yoga) Do Not substitute Alcohol to help with tapering If you have been on opioids for less than two weeks and do not have pain than it is ok to stop all together.  Plan to wean off of opioids This plan should start within one week post op of your joint replacement. Maintain the same interval or time between taking each dose and first decrease the dose.  Cut the total daily intake of opioids by one tablet each  day Next start to increase the time between doses. The last dose that should be eliminated is the evening dose.   MAKE SURE YOU:  Understand   these instructions.  Get help right away if you are not doing well or get worse.    Thank you for letting us be a part of your medical care team.  It is a privilege we respect greatly.  We hope these instructions will help you stay on track for a fast and full recovery!      

## 2021-10-01 NOTE — Plan of Care (Signed)
°  Problem: Education: Goal: Knowledge of General Education information will improve Description: Including pain rating scale, medication(s)/side effects and non-pharmacologic comfort measures Outcome: Progressing   Problem: Coping: Goal: Level of anxiety will decrease Outcome: Progressing   Problem: Pain Managment: Goal: General experience of comfort will improve Outcome: Progressing   Problem: Safety: Goal: Ability to remain free from injury will improve Outcome: Progressing   Problem: Clinical Measurements: Goal: Postoperative complications will be avoided or minimized Outcome: Progressing   Problem: Pain Management: Goal: Pain level will decrease with appropriate interventions Outcome: Progressing

## 2021-10-01 NOTE — Anesthesia Procedure Notes (Signed)
Procedure Name: LMA Insertion Date/Time: 10/01/2021 1:55 PM Performed by: Deliah Boston, CRNA Pre-anesthesia Checklist: Patient identified, Emergency Drugs available, Suction available and Patient being monitored Patient Re-evaluated:Patient Re-evaluated prior to induction Oxygen Delivery Method: Circle system utilized Preoxygenation: Pre-oxygenation with 100% oxygen Induction Type: IV induction Ventilation: Mask ventilation without difficulty and Oral airway inserted - appropriate to patient size LMA: LMA with gastric port inserted LMA Size: 4.0 Number of attempts: 1 Placement Confirmation: positive ETCO2 and breath sounds checked- equal and bilateral Tube secured with: Tape Dental Injury: Teeth and Oropharynx as per pre-operative assessment

## 2021-10-02 DIAGNOSIS — M1711 Unilateral primary osteoarthritis, right knee: Secondary | ICD-10-CM | POA: Diagnosis not present

## 2021-10-02 LAB — CBC
HCT: 31.8 % — ABNORMAL LOW (ref 36.0–46.0)
Hemoglobin: 10 g/dL — ABNORMAL LOW (ref 12.0–15.0)
MCH: 25.4 pg — ABNORMAL LOW (ref 26.0–34.0)
MCHC: 31.4 g/dL (ref 30.0–36.0)
MCV: 80.9 fL (ref 80.0–100.0)
Platelets: 229 10*3/uL (ref 150–400)
RBC: 3.93 MIL/uL (ref 3.87–5.11)
RDW: 14.8 % (ref 11.5–15.5)
WBC: 10.7 10*3/uL — ABNORMAL HIGH (ref 4.0–10.5)
nRBC: 0 % (ref 0.0–0.2)

## 2021-10-02 LAB — GLUCOSE, CAPILLARY
Glucose-Capillary: 179 mg/dL — ABNORMAL HIGH (ref 70–99)
Glucose-Capillary: 173 mg/dL — ABNORMAL HIGH (ref 70–99)

## 2021-10-02 LAB — BASIC METABOLIC PANEL
Anion gap: 7 (ref 5–15)
BUN: 29 mg/dL — ABNORMAL HIGH (ref 8–23)
CO2: 23 mmol/L (ref 22–32)
Calcium: 8.5 mg/dL — ABNORMAL LOW (ref 8.9–10.3)
Chloride: 104 mmol/L (ref 98–111)
Creatinine, Ser: 1.82 mg/dL — ABNORMAL HIGH (ref 0.44–1.00)
GFR, Estimated: 30 mL/min — ABNORMAL LOW (ref >60)
Glucose, Bld: 196 mg/dL — ABNORMAL HIGH (ref 70–99)
Potassium: 4.9 mmol/L (ref 3.5–5.1)
Sodium: 134 mmol/L — ABNORMAL LOW (ref 135–145)

## 2021-10-02 MED ORDER — HYDROCORTISONE 2.5 % EX CREA: TOPICAL_CREAM | Freq: Two times a day (BID) | CUTANEOUS | 0 refills | Status: AC

## 2021-10-02 MED ORDER — HYDROCORTISONE 1 % EX CREA
TOPICAL_CREAM | Freq: Three times a day (TID) | CUTANEOUS | Status: DC
  Filled 2021-10-02: qty 28

## 2021-10-02 MED ORDER — DIPHENHYDRAMINE HCL 25 MG PO CAPS
25.0000 mg | ORAL_CAPSULE | Freq: Three times a day (TID) | ORAL | Status: DC | PRN
  Administered 2021-10-02: 25 mg via ORAL
  Filled 2021-10-02: qty 1

## 2021-10-02 NOTE — Plan of Care (Signed)
°  Problem: Clinical Measurements: Goal: Diagnostic test results will improve Outcome: Progressing   Problem: Clinical Measurements: Goal: Respiratory complications will improve Outcome: Progressing   Problem: Elimination: Goal: Will not experience complications related to bowel motility Outcome: Progressing

## 2021-10-02 NOTE — Progress Notes (Signed)
° °  Subjective:  [name] is a 66 y.o. female, 1 Day Post-Op    s/p Procedure(s): TOTAL KNEE ARTHROPLASTY   Patient reports pain as mild to moderate.  Reports she is doing well has been able to pass gas and go to the restroom.  Denies numbness or tingling.  Denies fever or chills.  Overall doing well with some pain overnight.  Objective:   VITALS:   Vitals:   10/01/21 1713 10/01/21 1920 10/02/21 0123 10/02/21 0538  BP: 137/81 126/69 134/77 129/89  Pulse: 82 83 89 92  Resp: $Remo'14 16 14 16  'SxkoF$ Temp: 97.7 F (36.5 C) 97.7 F (36.5 C) 98.1 F (36.7 C) 98.6 F (37 C)  TempSrc: Oral Oral Oral Oral  SpO2: 96% 98% 97% 97%  Weight:      Height:      No acute distress   Right lower extremity: ABD soft Neurovascular intact Sensation intact distally Intact pulses distally Dorsiflexion/Plantar flexion intact Incision: dressing C/D/I Calf soft and supple   Lab Results  Component Value Date   WBC 6.1 09/21/2021   HGB 11.1 (L) 09/21/2021   HCT 35.6 (L) 09/21/2021   MCV 80.5 09/21/2021   PLT 252 09/21/2021   BMET    Component Value Date/Time   NA 134 (L) 10/01/2021 1626   NA 141 02/17/2021 0947   K 3.9 10/01/2021 1626   CL 102 10/01/2021 1626   CO2 24 10/01/2021 1626   GLUCOSE 79 10/01/2021 1626   BUN 31 (H) 10/01/2021 1626   BUN 29 (H) 02/17/2021 0947   CREATININE 2.16 (H) 10/01/2021 1626   CALCIUM 8.4 (L) 10/01/2021 1626   EGFR 29 (L) 02/17/2021 0947   GFRNONAA 25 (L) 10/01/2021 1626     Assessment/Plan: 1 Day Post-Op   Principal Problem:   Right knee DJD   Advance diet Up with therapy  Recommend complete PT session and then will discharge if she tolerates this well Discussed with patient that she needed a wheelchair to assist with activities of daily living in the early postop period and for issues with longer ambulation. DME order placed.   Weightbearing Status: Weightbearing as tolerated, knee immobilizer while ambulating until quad control is  regained and at night  DVT Prophylaxis: Aspirin SCDs  Repeat labs show improved creatinine and stable hemoglobin at 10. Faythe Casa 10/02/2021, 7:05 AM  Jonelle Sidle PA-C  Physician Assistant with Dr. Lillia Abed [place]

## 2021-10-02 NOTE — Plan of Care (Signed)
Pt to d/c home with family. No needs at last time of working with physical therapy. Pt wheelchair delivered to room.

## 2021-10-02 NOTE — TOC Transition Note (Signed)
Transition of Care [facility]) - CM/SW Discharge Note   Patient Details  Name: BILLY ROCCO MRN: [id number] Date of Birth: 1955/08/12  Transition of Care [facility]) CM/SW Contact:  Ross Ludwig, LCSW Phone Number: 10/02/2021, 3:01 PM   Clinical Narrative:     Patient will be going home with home health through [place].  CSW signing off please reconsult with any other social work needs, home health agency has been notified of planned discharge.        Final next level of care: [place] Barriers to Discharge: Barriers Resolved   Patient Goals and CMS Choice Patient states their goals for this hospitalization and ongoing recovery are:: To return back home with home health. CMS Medicare.gov Compare Post Acute Care list provided to:: Patient Choice offered to / list presented to : Patient  Discharge Placement                       Discharge Plan and Services                DME Arranged: Lightweight manual wheelchair with seat cushion DME Agency: [employer] Date DME Agency Contacted: 10/02/21 Time DME Agency Contacted: 1100 Representative spoke with at DME Agency: Brenton Grills HH Arranged: PT [place]: [place] Date [place]: 10/02/21 Time [place]: 1501 Representative spoke with at [place]: [place] Determinants of Health ([place]) Interventions     Readmission Risk Interventions No flowsheet data found.

## 2021-10-02 NOTE — Progress Notes (Signed)
Pt stable with no needs at time of d/c instructions and education. Pt got wheelchair prior to d/c.

## 2021-10-02 NOTE — Evaluation (Signed)
Physical Therapy Evaluation Patient Details [name] MRN: [id number] DOB: 1955-10-29 Today's Date: 10/02/2021  History of Present Illness  Pt s/p R TKR and with hx of L TKR, cervical fusioin, DM with peripheral neuropathy, and CKD  Clinical Impression  Pt s/p R TKR and presents with decreased R LE strength/ROM and post op pain limiting functional mobility.  Pt should progress to dc home with family assist and reports HHPT planned for first two weeks.     Recommendations for follow up therapy are one component of a multi-disciplinary discharge planning process, led by the attending physician.  Recommendations may be updated based on patient status, additional functional criteria and insurance authorization.  Follow Up Recommendations Follow physician's recommendations for discharge plan and follow up therapies    Assistance Recommended at Discharge Intermittent Supervision/Assistance  Patient can return home with the following  A little help with walking and/or transfers;A little help with bathing/dressing/bathroom;Assist for transportation;Help with stairs or ramp for entrance;Assistance with cooking/housework    Equipment Recommendations None recommended by PT  Recommendations for Other Services       Functional Status Assessment Patient has had a recent decline in their functional status and demonstrates the ability to make significant improvements in function in a reasonable and predictable amount of time.     Precautions / Restrictions Precautions Precautions: Knee;Fall Required Braces or Orthoses: Knee Immobilizer - Right Knee Immobilizer - Right: Discontinue once straight leg raise with < 10 degree lag (Pt performed IND SLR this am) Restrictions Weight Bearing Restrictions: No Other Position/Activity Restrictions: WBAT      Mobility  Bed Mobility Overal bed mobility: Needs Assistance Bed Mobility: Supine to Sit     Supine to sit: Min assist      General bed mobility comments: cues for sequence and use of L LE to self assist    Transfers Overall transfer level: Needs assistance Equipment used: Rolling walker (2 wheels) Transfers: Sit to/from Stand Sit to Stand: Min assist           General transfer comment: cues for LE management and use of UEs to self assist    Ambulation/Gait Ambulation/Gait assistance: Min assist, Min guard Gait Distance (Feet): 80 Feet Assistive device: Rolling walker (2 wheels) Gait Pattern/deviations: Step-to pattern, Decreased step length - right, Decreased step length - left, Shuffle, Trunk flexed Gait velocity: decr     General Gait Details: cues for posture, position from RW and sequence  Stairs            Wheelchair Mobility    Modified Rankin (Stroke Patients Only)       Balance Overall balance assessment: Needs assistance Sitting-balance support: No upper extremity supported, Feet supported Sitting balance-Leahy Scale: Good     Standing balance support: Single extremity supported Standing balance-Leahy Scale: Poor                               Pertinent Vitals/Pain Pain Assessment Pain Assessment: 0-10 Pain Score: 6  Pain Location: R knee Pain Descriptors / Indicators: Aching, Sore Pain Intervention(s): Limited activity within patient's tolerance, Monitored during session, Premedicated before session, Ice applied    Home Living Family/patient expects to be discharged to:: Private residence Living Arrangements: Other relatives Available Help at Discharge: Family Type of Home: House Home Access: Stairs to enter Entrance Stairs-Rails: Right;Left;Can reach both Entrance Stairs-Number of Steps: 4 at front, 2 at back   Home Layout: Able to  live on main level with bedroom/bathroom Home Equipment: Rolling Walker (2 wheels);Cane - single point      Prior Function Prior Level of Function : Independent/Modified Independent                      Hand Dominance   Dominant Hand: Right    Extremity/Trunk Assessment   Upper Extremity Assessment Upper Extremity Assessment: Overall WFL for tasks assessed    Lower Extremity Assessment Lower Extremity Assessment: RLE deficits/detail RLE Deficits / Details: 3/5 quads with IND SLR; AAROM at knee - 5 - 50    Cervical / Trunk Assessment Cervical / Trunk Assessment: Normal  Communication   Communication: No difficulties  Cognition Arousal/Alertness: Awake/alert Behavior During Therapy: WFL for tasks assessed/performed Overall Cognitive Status: Within Functional Limits for tasks assessed                                          General Comments      Exercises Total Joint Exercises Ankle Circles/Pumps: AROM, Both, 15 reps, Supine Quad Sets: AROM, Both, 10 reps, Supine Heel Slides: AAROM, Right, 15 reps, Supine Straight Leg Raises: AAROM, AROM, Right, 10 reps, Supine   Assessment/Plan    PT Assessment Patient needs continued PT services  PT Problem List Decreased strength;Decreased range of motion;Decreased activity tolerance;Decreased balance;Decreased mobility;Decreased knowledge of use of DME;Pain       PT Treatment Interventions DME instruction;Gait training;Stair training;Functional mobility training;Therapeutic activities;Therapeutic exercise;Patient/family education    PT Goals (Current goals can be found in the Care Plan section)  Acute Rehab PT Goals Patient Stated Goal: Regain IND PT Goal Formulation: With patient Time For Goal Achievement: 10/08/21 Potential to Achieve Goals: Good    Frequency 7X/week     Co-evaluation               AM-PAC PT "6 Clicks" Mobility  Outcome Measure Help needed turning from your back to your side while in a flat bed without using bedrails?: A Little Help needed moving from lying on your back to sitting on the side of a flat bed without using bedrails?: A Little Help needed moving to and from a  bed to a chair (including a wheelchair)?: A Little Help needed standing up from a chair using your arms (e.g., wheelchair or bedside chair)?: A Little Help needed to walk in hospital room?: A Little Help needed climbing 3-5 steps with a railing? : A Little 6 Click Score: 18    End of Session Equipment Utilized During Treatment: Gait belt Activity Tolerance: Patient tolerated treatment well Patient left: in chair;with call bell/phone within reach;with chair alarm set;with family/visitor present Nurse Communication: Mobility status PT Visit Diagnosis: Unsteadiness on feet (R26.81);Difficulty in walking, not elsewhere classified (R26.2)    Time: 2620-3559 PT Time Calculation (min) (ACUTE ONLY): 35 min   Charges:   PT Evaluation $PT Eval Low Complexity: 1 Low PT Treatments $Therapeutic Exercise: 8-22 mins        Debe Coder PT Acute Rehabilitation Services Pager [phone] Office [phone]   [facility] 10/02/2021, 12:46 PM

## 2021-10-02 NOTE — Progress Notes (Signed)
Physical Therapy Treatment Patient Details [name] MRN: [id number] DOB: 1955/11/07 Today's Date: 10/02/2021   History of Present Illness Pt s/p R TKR and with hx of L TKR, cervical fusioin, DM with peripheral neuropathy, and CKD    PT Comments    Pt motivated and progressing well with mobility.  Pt ambulated increased distance in hall, negotiated stairs, performed limited HEP program and reviewed written instruction for same.  Pt eager for dc home this date.   Recommendations for follow up therapy are one component of a multi-disciplinary discharge planning process, led by the attending physician.  Recommendations may be updated based on patient status, additional functional criteria and insurance authorization.  Follow Up Recommendations  Follow physician's recommendations for discharge plan and follow up therapies     Assistance Recommended at Discharge Intermittent Supervision/Assistance  Patient can return home with the following A little help with walking and/or transfers;A little help with bathing/dressing/bathroom;Assist for transportation;Help with stairs or ramp for entrance;Assistance with cooking/housework   Equipment Recommendations  None recommended by PT    Recommendations for Other Services       Precautions / Restrictions Precautions Precautions: Knee;Fall Required Braces or Orthoses: Knee Immobilizer - Right Knee Immobilizer - Right: Discontinue once straight leg raise with < 10 degree lag Restrictions Weight Bearing Restrictions: No Other Position/Activity Restrictions: WBAT     Mobility  Bed Mobility Overal bed mobility: Needs Assistance Bed Mobility: Supine to Sit, Sit to Supine     Supine to sit: Min guard Sit to supine: Min guard   General bed mobility comments: cues for sequence and use of L LE to self assist    Transfers Overall transfer level: Needs assistance Equipment used: Rolling walker (2 wheels) Transfers: Sit to/from  Stand Sit to Stand: Min guard, Supervision           General transfer comment: cues for LE management and use of UEs to self assist    Ambulation/Gait Ambulation/Gait assistance: Min guard, Supervision Gait Distance (Feet): 140 Feet Assistive device: Rolling walker (2 wheels) Gait Pattern/deviations: Step-to pattern, Decreased step length - right, Decreased step length - left, Shuffle, Trunk flexed Gait velocity: decr     General Gait Details: cues for posture, position from RW and sequence   Stairs Stairs: Yes Stairs assistance: Min assist Stair Management: Two rails, Step to pattern Number of Stairs: 5 General stair comments: min cues for sequence   Wheelchair Mobility    Modified Rankin (Stroke Patients Only)       Balance Overall balance assessment: Needs assistance Sitting-balance support: No upper extremity supported, Feet supported Sitting balance-Leahy Scale: Good     Standing balance support: No upper extremity supported Standing balance-Leahy Scale: Poor                              Cognition Arousal/Alertness: Awake/alert Behavior During Therapy: WFL for tasks assessed/performed Overall Cognitive Status: Within Functional Limits for tasks assessed                                          Exercises Total Joint Exercises Ankle Circles/Pumps: AROM, Both, 15 reps, Supine Quad Sets: AROM, Both, 10 reps, Supine Heel Slides: AAROM, Right, Supine, 10 reps Hip ABduction/ADduction: AAROM, Right, 10 reps, Supine Straight Leg Raises: AAROM, AROM, Right, Supine, 5 reps Long Arc Quad: AAROM,  Right, 5 reps, Seated    General Comments        Pertinent Vitals/Pain Pain Assessment Pain Assessment: 0-10 Pain Score: 4  Pain Location: R knee Pain Descriptors / Indicators: Aching, Sore Pain Intervention(s): Limited activity within patient's tolerance, Monitored during session, Premedicated before session    Home Living                           Prior Function            PT Goals (current goals can now be found in the care plan section) Acute Rehab PT Goals Patient Stated Goal: Regain IND PT Goal Formulation: With patient Time For Goal Achievement: 10/08/21 Potential to Achieve Goals: Good Progress towards PT goals: Progressing toward goals    Frequency    7X/week      PT Plan Current plan remains appropriate    Co-evaluation              AM-PAC PT "6 Clicks" Mobility   Outcome Measure  Help needed turning from your back to your side while in a flat bed without using bedrails?: A Little Help needed moving from lying on your back to sitting on the side of a flat bed without using bedrails?: A Little Help needed moving to and from a bed to a chair (including a wheelchair)?: A Little Help needed standing up from a chair using your arms (e.g., wheelchair or bedside chair)?: A Little Help needed to walk in hospital room?: A Little Help needed climbing 3-5 steps with a railing? : A Little 6 Click Score: 18    End of Session Equipment Utilized During Treatment: Gait belt Activity Tolerance: Patient tolerated treatment well Patient left: with call bell/phone within reach;with family/visitor present;in bed Nurse Communication: Mobility status PT Visit Diagnosis: Unsteadiness on feet (R26.81);Difficulty in walking, not elsewhere classified (R26.2)     Time: 6720-9470 PT Time Calculation (min) (ACUTE ONLY): 37 min  Charges:  $Gait Training: 8-22 mins $Therapeutic Exercise: 8-22 mins                     [place] Pager [phone] Office [phone]    [facility] 10/02/2021, 4:41 PM

## 2021-10-03 ENCOUNTER — Encounter (HOSPITAL_COMMUNITY): Payer: Self-pay | Admitting: Specialist

## 2021-10-04 ENCOUNTER — Ambulatory Visit: Payer: Medicare Other

## 2021-10-04 NOTE — Anesthesia Postprocedure Evaluation (Signed)
Anesthesia Post Note  Patient: [name]  Procedure(s) Performed: TOTAL KNEE ARTHROPLASTY (Right: Knee)     Patient location during evaluation: PACU Anesthesia Type: Combined General/Spinal Level of consciousness: oriented and awake and alert Pain management: pain level controlled Vital Signs Assessment: post-procedure vital signs reviewed and stable Respiratory status: spontaneous breathing, respiratory function stable and patient connected to nasal cannula oxygen Cardiovascular status: blood pressure returned to baseline and stable Postop Assessment: no headache, no backache and no apparent nausea or vomiting Anesthetic complications: no   No notable events documented.  Last Vitals:  Vitals:   10/02/21 0538 10/02/21 0900  BP: 129/89 130/85  Pulse: 92   Resp: 16   Temp: 37 C 36.6 C  SpO2: 97%     Last Pain:  Vitals:   10/02/21 1148  TempSrc:   PainSc: 6                  Riki Gehring S

## 2021-10-14 ENCOUNTER — Other Ambulatory Visit (HOSPITAL_COMMUNITY): Payer: Self-pay

## 2021-10-14 ENCOUNTER — Other Ambulatory Visit: Payer: Self-pay | Admitting: Pulmonary Disease

## 2021-10-14 DIAGNOSIS — J4551 Severe persistent asthma with (acute) exacerbation: Secondary | ICD-10-CM

## 2021-10-18 ENCOUNTER — Other Ambulatory Visit: Payer: Self-pay | Admitting: Pulmonary Disease

## 2021-10-18 ENCOUNTER — Other Ambulatory Visit (HOSPITAL_COMMUNITY): Payer: Self-pay

## 2021-10-18 DIAGNOSIS — J4551 Severe persistent asthma with (acute) exacerbation: Secondary | ICD-10-CM

## 2021-10-18 MED ORDER — NUCALA 100 MG/ML ~~LOC~~ SOAJ
100.0000 mg | SUBCUTANEOUS | 5 refills | Status: DC
  Filled 2021-10-18: qty 1, 28d supply, fill #0
  Filled 2021-11-09: qty 1, 28d supply, fill #1
  Filled 2021-12-15: qty 1, 28d supply, fill #2
  Filled 2022-01-06: qty 1, 28d supply, fill #3
  Filled 2022-02-11: qty 1, 28d supply, fill #4
  Filled 2022-03-14: qty 1, 28d supply, fill #5

## 2021-10-18 NOTE — Telephone Encounter (Signed)
Nucala renewed ?

## 2021-10-19 ENCOUNTER — Other Ambulatory Visit (HOSPITAL_COMMUNITY): Payer: Self-pay

## 2021-10-20 ENCOUNTER — Other Ambulatory Visit (HOSPITAL_COMMUNITY): Payer: Self-pay

## 2021-10-22 ENCOUNTER — Other Ambulatory Visit (HOSPITAL_BASED_OUTPATIENT_CLINIC_OR_DEPARTMENT_OTHER): Payer: Self-pay | Admitting: Endocrinology

## 2021-10-22 DIAGNOSIS — Z1231 Encounter for screening mammogram for malignant neoplasm of breast: Secondary | ICD-10-CM

## 2021-10-26 ENCOUNTER — Encounter (HOSPITAL_BASED_OUTPATIENT_CLINIC_OR_DEPARTMENT_OTHER): Payer: Self-pay

## 2021-10-26 ENCOUNTER — Other Ambulatory Visit: Payer: Self-pay

## 2021-10-26 ENCOUNTER — Ambulatory Visit (HOSPITAL_BASED_OUTPATIENT_CLINIC_OR_DEPARTMENT_OTHER)
Admission: RE | Admit: 2021-10-26 | Discharge: 2021-10-26 | Disposition: A | Discharge status: Home / Self Care | Payer: Medicare Other | Source: Ambulatory Visit | Attending: Endocrinology | Admitting: Endocrinology

## 2021-10-26 DIAGNOSIS — Z1231 Encounter for screening mammogram for malignant neoplasm of breast: Secondary | ICD-10-CM | POA: Insufficient documentation

## 2021-11-02 ENCOUNTER — Encounter: Payer: Self-pay | Admitting: Physical Therapy

## 2021-11-02 ENCOUNTER — Ambulatory Visit: Payer: Medicare Other | Attending: Orthopedic Surgery | Admitting: Physical Therapy

## 2021-11-02 ENCOUNTER — Other Ambulatory Visit: Payer: Self-pay

## 2021-11-02 DIAGNOSIS — M25661 Stiffness of right knee, not elsewhere classified: Secondary | ICD-10-CM | POA: Insufficient documentation

## 2021-11-02 DIAGNOSIS — R262 Difficulty in walking, not elsewhere classified: Secondary | ICD-10-CM | POA: Insufficient documentation

## 2021-11-02 DIAGNOSIS — M6281 Muscle weakness (generalized): Secondary | ICD-10-CM | POA: Diagnosis present

## 2021-11-02 DIAGNOSIS — M25561 Pain in right knee: Secondary | ICD-10-CM | POA: Diagnosis present

## 2021-11-02 DIAGNOSIS — R600 Localized edema: Secondary | ICD-10-CM | POA: Insufficient documentation

## 2021-11-02 NOTE — Patient Instructions (Signed)
Access Code: [email or web address] ?URL: [email or web address] ?Date: 11/02/2021 ?Prepared by: Almyra Free ? ?Exercises ?- Seated Knee Flexion Stretch  - 3 x daily - 7 x weekly - 1 sets - 3 reps - 30-60 sec hold ?- Supine Knee Extension Strengthening  - 1 x daily - 7 x weekly - 2 sets - 10 reps ?- Supine Heel Slide with Strap (Mirrored)  - 2 x daily - 7 x weekly - 1-2 sets - 10 reps ?- Supine Hamstring Stretch with Strap  - 2 x daily - 7 x weekly - 1 sets - 3 reps - 60 sec hold ?

## 2021-11-02 NOTE — Therapy (Signed)
[facility] ?[address] ?[place], [name], [address] ?Phone: [phone]   Fax:  [phone] ? ?Physical Therapy Evaluation ? ?Patient Details  ?[name] ?MRN: [id number] ?Date of Birth: 09-22-1955 ?Referring Provider (PT): Lacie Draft PA-C/Jeffrey Beane MD ? ? ?Encounter Date: 11/02/2021 ? ? PT End of Session - 11/02/21 1059   ? ? Visit Number 1   ? Date for PT Re-Evaluation 12/28/21   ? Authorization Type UHC MCR   ? Progress Note Due on Visit 10   ? PT Start Time 1100   ? PT Stop Time 1148   ? PT Time Calculation (min) 48 min   ? Activity Tolerance Patient tolerated treatment well   ? Behavior During Therapy [facility] for tasks assessed/performed   ? ?  ?  ? ?  ? ? ?Past Medical History:  ?Diagnosis Date  ? Acute bronchitis   ? Anemia   ? hx of anemia  ? Anginal pain ([place])   ? Arthritis   ? osteo  ? Asthma   ? Chronic kidney disease (CKD), stage IV (severe) (HCC)   ? Coronary atherosclerosis   ? Cough   ? GERD (gastroesophageal reflux disease)   ? History of colon polyps   ? History of migraine   ? Hyperlipidemia   ? Hypertension   ? Obstructive sleep apnea (adult) (pediatric)   ? NO CPAP   ? Peripheral neuropathy   ? Type II or unspecified type diabetes mellitus without mention of complication, not stated as uncontrolled   ? type 2  ? Unspecified hypothyroidism   ? ? ?Past Surgical History:  ?Procedure Laterality Date  ? ANTERIOR FUSION CERVICAL SPINE  2009  ? CARPAL TUNNEL RELEASE Left   ? COLONOSCOPY    ? CORONARY STENT INTERVENTION  2007  ? LEFT HEART CATHETERIZATION WITH CORONARY ANGIOGRAM N/A 05/29/2014  ? Procedure: LEFT HEART CATHETERIZATION WITH CORONARY ANGIOGRAM;  Surgeon: Sinclair Grooms, MD;  [facility] C[facility] LAB;  Service: Cardiovascular;  Laterality: N/A;  ? 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  ? Procedure: TOTAL KNEE ARTHROPLASTY;  Surgeon: Susa Day, MD;  Location: [facility] ORS;  Service: Orthopedics;  Laterality:  Left;  3 hrs  ? TOTAL KNEE ARTHROPLASTY Right 10/01/2021  ? Procedure: TOTAL KNEE ARTHROPLASTY;  Surgeon: Susa Day, MD;  Location: [facility] ORS;  Service: Orthopedics;  Laterality: Right;  ? TUBAL LIGATION  1990  ? WISDOM TOOTH EXTRACTION    ? ? ?There were no vitals filed for this visit. ? ? ? Subjective Assessment - 11/02/21 1106   ? ? Subjective Patient had right TKA on 10/01/21. She is having considerable pain in the medial side of the knee and stiffness.   ? Pertinent History Left TKA, ACDF, DM, COPD, OA, HTN   ? Patient Stated Goals Get the right knee like the left   ? Currently in Pain? Yes   ? Pain Score 5    ? Pain Location Knee   ? Pain Orientation Right;Medial   ? Pain Descriptors / Indicators Tender;Sharp;Throbbing   ? Pain Type Surgical pain   ? Pain Onset 1 to 4 weeks ago   ? Pain Frequency Intermittent   ? Pain Relieving Factors ice   ? ?  ?  ? ?  ? ? ? ? ? [facility] PT Assessment - 11/02/21 0001   ? ?  ? Assessment  ? Medical Diagnosis Aftercare following joint replacement surgery   ?  Referring Provider (PT) Lacie Draft PA-C/Jeffrey Beane MD   ? Onset Date/Surgical Date 10/01/21   ? Hand Dominance Right   ? Next MD Visit 11/10/21   ? Prior Therapy HHPT 2 weeks ended 10/18/21   ?  ? Precautions  ? Precautions None   ?  ? Restrictions  ? Weight Bearing Restrictions No   ?  ? Balance Screen  ? Has the patient fallen in the past 6 months No   ? Has the patient had a decrease in activity level because of a fear of falling?  No   ? Is the patient reluctant to leave their home because of a fear of falling?  No   ?  ? Home Environment  ? Living Environment Private residence   ? Living Arrangements Other relatives   ? Available Help at Discharge Family   ? Type of Home House   ? Home Access Stairs to enter   ? Entrance Stairs-Number of Steps 4   ? Entrance Stairs-Rails Can reach both   ? Home Layout Two level   ? [place] - 2 wheels;Cane - single point;Wheelchair - manual   ?  ? Prior Function  ? Level  of Independence Independent with basic ADLs   ? Vocation Retired   ? Leisure goes to the gym; walking around mall   ?  ? Observation/Other Assessments  ? Focus on Therapeutic Outcomes (FOTO)  27 (79 predicted)   ?  ? Observation/Other Assessments-Edema   ? Edema Circumferential   ?  ? Circumferential Edema  ? Circumferential - Right 42 cm   ? Circumferential - Left  40.5 cm   ?  ? ROM / Strength  ? AROM / PROM / Strength AROM;PROM;Strength   ?  ? AROM  ? AROM Assessment Site Knee   ? Right/Left Knee Right;Left   ? Right Knee Extension -5   ? Right Knee Flexion 110   ? Left Knee Extension 0   ? Left Knee Flexion 125   ?  ? PROM  ? PROM Assessment Site Knee   ? Right/Left Knee Right   ? Right Knee Extension -2   ? Right Knee Flexion 114   ?  ? Strength  ? Overall Strength Comments B hip fllex 4+/5; L knee 5/5   ? Strength Assessment Site Knee   ? Right/Left Knee Right   ? Right Knee Flexion 4+/5   ? Right Knee Extension 5/5   ?  ? Flexibility  ? Soft Tissue Assessment /Muscle Length yes   ? Hamstrings mild tightmess R   ?  ? Palpation  ? Patella mobility WNL   ? Palpation comment marked tenderness in right medial knee, hip ADDuctors, R ITB/lateral quad   ?  ? Ambulation/Gait  ? Ambulation/Gait Yes   ? Ambulation/Gait Assistance 6: Modified independent (Device/Increase time)   ? Ambulation Distance (Feet) 40 Feet   ? Assistive device Rolling walker   ? Gait Pattern Step-through pattern;Decreased stance time - right;Decreased hip/knee flexion - right   ? Ambulation Surface Level   ? Gait velocity slow to start, then WNL   ? Gait Comments antalgic   ? ?  ?  ? ?  ? ? ? ? ? ? ? ? ? ? ? ? ? ?Objective measurements completed on examination: See above findings.  ? ? ? ? ? [place] Adult PT Treatment/Exercise - 11/02/21 0001   ? ?  ? Exercises  ? Exercises Knee/Hip   ?  ?  Knee/Hip Exercises: Stretches  ? Passive Hamstring Stretch Right;1 rep;30 seconds   ? Passive Hamstring Stretch Limitations with strap   ? Knee: Self-Stretch to  increase Flexion Right;1 rep;20 seconds   ? Knee: Self-Stretch Limitations seated   ?  ? Knee/Hip Exercises: Supine  ? Short Arc [employer] Right;10 reps   ? Short Arc [employer] Limitations 5 sec   ? Heel Slides Right;5 reps   ? Heel Slides Limitations with strap   ?  ? Modalities  ? Modalities Vasopneumatic   ?  ? Vasopneumatic  ? Number Minutes Vasopneumatic  10 minutes   ? Vasopnuematic Location  Knee   ? Vasopneumatic Pressure Medium   ? Vasopneumatic Temperature  34 deg   ? ?  ?  ? ?  ? ? ? ? ? ? ? ? ? ? PT Education - 11/02/21 1138   ? ? Education Details HEP reviewed current and added new; POC   ? Person(s) Educated Patient   ? Methods Explanation;Demonstration;Handout   ? Comprehension Verbalized understanding;Returned demonstration   ? ?  ?  ? ?  ? ? ? PT Short Term Goals - 11/02/21 1315   ? ?  ? PT SHORT TERM GOAL #1  ? Title Patient to be independent with initial HEP.   ? Time 3   ? Period Weeks   ? Status New   ? Target Date 11/23/21   ? ?  ?  ? ?  ? ? ? ? PT Long Term Goals - 11/02/21 1317   ? ?  ? PT LONG TERM GOAL #1  ? Title Patient to be independent with advanced HEP.   ? Time 8   ? Period Weeks   ? Status New   ? Target Date 12/28/21   ?  ? PT LONG TERM GOAL #2  ? Title Improved right knee ROM 0 - 120 deg or better to normalize gait and ADLS   ? Time 8   ? Period Weeks   ? Status New   ?  ? PT LONG TERM GOAL #3  ? Title Patient able to climb stairs with 5/5 right knee stength and a reciprocal gait pattern.   ? Time 8   ? Period Weeks   ? Status New   ? Target Date 12/28/21   ?  ? PT LONG TERM GOAL #4  ? Title Patient able to amb community distances safely with a normal gait pattern and no AD   ? Time 8   ? Period Weeks   ? Status New   ? Target Date 12/28/21   ?  ? PT LONG TERM GOAL #5  ? Title Patient able to perform ADLS with >= 20% reduction in pain.   ? Time 8   ? Period Weeks   ? Status New   ? Target Date 12/28/21   ?  ? Additional Long Term Goals  ? Additional Long Term Goals Yes   ?  ? PT  LONG TERM GOAL #6  ? Title improved FOTO to 79 from 98 showing functional improvement   ? Time 8   ? Period Weeks   ? Status New   ? Target Date 12/28/21   ? ?  ?  ? ?  ? ? ? ? ? ? ? ? ? Plan - 11/02/21

## 2021-11-04 ENCOUNTER — Other Ambulatory Visit: Payer: Self-pay | Admitting: Physician Assistant

## 2021-11-05 ENCOUNTER — Other Ambulatory Visit: Payer: Self-pay | Admitting: Cardiovascular Disease

## 2021-11-09 ENCOUNTER — Other Ambulatory Visit (HOSPITAL_COMMUNITY): Payer: Self-pay

## 2021-11-09 ENCOUNTER — Ambulatory Visit: Payer: Medicare Other | Attending: Orthopedic Surgery

## 2021-11-09 DIAGNOSIS — M25661 Stiffness of right knee, not elsewhere classified: Secondary | ICD-10-CM | POA: Diagnosis present

## 2021-11-09 DIAGNOSIS — M25561 Pain in right knee: Secondary | ICD-10-CM | POA: Insufficient documentation

## 2021-11-09 DIAGNOSIS — R600 Localized edema: Secondary | ICD-10-CM | POA: Insufficient documentation

## 2021-11-09 DIAGNOSIS — M6281 Muscle weakness (generalized): Secondary | ICD-10-CM | POA: Diagnosis present

## 2021-11-09 DIAGNOSIS — M25562 Pain in left knee: Secondary | ICD-10-CM | POA: Diagnosis present

## 2021-11-09 DIAGNOSIS — R262 Difficulty in walking, not elsewhere classified: Secondary | ICD-10-CM | POA: Insufficient documentation

## 2021-11-09 NOTE — Therapy (Signed)
[place] ?[facility] ?[place] ?[place], [name], [address] ?Phone: [phone]   Fax:  [phone] ? ?Physical Therapy Treatment ? ?Patient Details  ?[name] ?MRN: [id number] ?Date of Birth: December 05, 1955 ?Referring Provider (PT): Lacie Draft PA-C/Jeffrey Beane MD ? ? ?Encounter Date: 11/09/2021 ? ? PT End of Session - 11/09/21 1153   ? ? Visit Number 2   ? Date for PT Re-Evaluation 12/28/21   ? Authorization Type UHC MCR   ? Progress Note Due on Visit 10   ? PT Start Time 1017   ? PT Stop Time 0350   ? PT Time Calculation (min) 52 min   ? Activity Tolerance Patient tolerated treatment well   ? Behavior During Therapy [facility] for tasks assessed/performed   ? ?  ?  ? ?  ? ? ?Past Medical History:  ?Diagnosis Date  ? Acute bronchitis   ? Anemia   ? hx of anemia  ? Anginal pain ([place])   ? Arthritis   ? osteo  ? Asthma   ? Chronic kidney disease (CKD), stage IV (severe) (HCC)   ? Coronary atherosclerosis   ? Cough   ? GERD (gastroesophageal reflux disease)   ? History of colon polyps   ? History of migraine   ? Hyperlipidemia   ? Hypertension   ? Obstructive sleep apnea (adult) (pediatric)   ? NO CPAP   ? Peripheral neuropathy   ? Type II or unspecified type diabetes mellitus without mention of complication, not stated as uncontrolled   ? type 2  ? Unspecified hypothyroidism   ? ? ?Past Surgical History:  ?Procedure Laterality Date  ? ANTERIOR FUSION CERVICAL SPINE  2009  ? CARPAL TUNNEL RELEASE Left   ? COLONOSCOPY    ? CORONARY STENT INTERVENTION  2007  ? LEFT HEART CATHETERIZATION WITH CORONARY ANGIOGRAM N/A 05/29/2014  ? Procedure: LEFT HEART CATHETERIZATION WITH CORONARY ANGIOGRAM;  Surgeon: Sinclair Grooms, MD;  Location: [facility] CATH LAB;  Service: Cardiovascular;  Laterality: N/A;  ? 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  ? Procedure: TOTAL KNEE ARTHROPLASTY;  Surgeon: Susa Day, MD;  Location: [facility] ORS;  Service: Orthopedics;  Laterality:  Left;  3 hrs  ? TOTAL KNEE ARTHROPLASTY Right 10/01/2021  ? Procedure: TOTAL KNEE ARTHROPLASTY;  Surgeon: Susa Day, MD;  Location: [facility] ORS;  Service: Orthopedics;  Laterality: Right;  ? TUBAL LIGATION  1990  ? WISDOM TOOTH EXTRACTION    ? ? ?There were no vitals filed for this visit. ? ? Subjective Assessment - 11/09/21 1021   ? ? Subjective Pt reports no pain today, just stiffness when walking.   ? Pertinent History Left TKA, ACDF, DM, COPD, OA, HTN   ? Patient Stated Goals Get the right knee like the left   ? Currently in Pain? No/denies   ? ?  ?  ? ?  ? ? ? ? ? [facility] PT Assessment - 11/09/21 0001   ? ?  ? AROM  ? Right Knee Extension 5   ? Right Knee Flexion 115   ? ?  ?  ? ?  ? ? ? ? ? ? ? ? ? ? ? ? ? ? ? ? [facility] Adult PT Treatment/Exercise - 11/09/21 0001   ? ?  ? Exercises  ? Exercises Knee/Hip   ?  ? Knee/Hip Exercises: Stretches  ? Passive Hamstring Stretch Right;2 reps;30 seconds   ? Passive Hamstring Stretch Limitations seated   ?  Other Knee/Hip Stretches manual HS stretch in supine x 30 sec   ?  ? Knee/Hip Exercises: Aerobic  ? Recumbent Bike L1x56min   ?  ? Knee/Hip Exercises: Standing  ? Lateral Step Up Right;10 reps;Hand Hold: 2;Step Height: 6"   ? Forward Step Up Right;10 reps;Hand Hold: 2;Step Height: 6"   ? Forward Step Up Limitations cues for hip extension   ? Stairs 26 stairs x 2; 8'; reciprocal pattern, noticed some fatigue towards the end, mod lack of eccentric quad control with R LE   ?  ? Knee/Hip Exercises: Seated  ? Sit to Sand 10 reps;without UE support   ?  ? Knee/Hip Exercises: Supine  ? Straight Leg Raises AROM;Strengthening;Right;2 sets;10 reps   ? Straight Leg Raises Limitations 1 set no weight; 2nd set 2#   ?  ? Vasopneumatic  ? Number Minutes Vasopneumatic  10 minutes   ? Vasopnuematic Location  Knee   ? Vasopneumatic Pressure Low   ? Vasopneumatic Temperature  34 deg   ? ?  ?  ? ?  ? ? ? ? ? ? ? ? ? ? ? ? PT Short Term Goals - 11/09/21 1154   ? ?  ? PT SHORT TERM GOAL #1  ? Title  Patient to be independent with initial HEP.   ? Time 3   ? Period Weeks   ? Status On-going   ? Target Date 11/23/21   ? ?  ?  ? ?  ? ? ? ? PT Long Term Goals - 11/09/21 1154   ? ?  ? PT LONG TERM GOAL #1  ? Title Patient to be independent with advanced HEP.   ? Time 8   ? Period Weeks   ? Status On-going   ? Target Date 12/28/21   ?  ? PT LONG TERM GOAL #2  ? Title Improved right knee ROM 0 - 120 deg or better to normalize gait and ADLS   ? Time 8   ? Period Weeks   ? Status On-going   ?  ? PT LONG TERM GOAL #3  ? Title Patient able to climb stairs with 5/5 right knee stength and a reciprocal gait pattern.   ? Time 8   ? Period Weeks   ? Status On-going   ? Target Date 12/28/21   ?  ? PT LONG TERM GOAL #4  ? Title Patient able to amb community distances safely with a normal gait pattern and no AD   ? Time 8   ? Period Weeks   ? Status On-going   ? Target Date 12/28/21   ?  ? PT LONG TERM GOAL #5  ? Title Patient able to perform ADLS with >= 30% reduction in pain.   ? Time 8   ? Period Weeks   ? Status On-going   ? Target Date 12/28/21   ?  ? PT LONG TERM GOAL #6  ? Title improved FOTO to 79 from 41 showing functional improvement   ? Time 8   ? Period Weeks   ? Status On-going   ? Target Date 12/28/21   ? ?  ?  ? ?  ? ? ? ? ? ? ? ? Plan - 11/09/21 1155   ? ? Clinical Impression Statement Pt denied needing to review initial HEP. R knee ROM measured 5-115 deg today in supine. Pt was able to do reciprocal pattern on stairs today, no increased pain but noted a considerable  amount of eccentric quad weakness with R LE. Pt reported to clinic w/o AD, showed to be ambulating just fine. Some cuing needed with step ups for full hip extension to engage more quads. Ended session with GR to address edema and soreness post session.   ? Personal Factors and Comorbidities Comorbidity 3+   ? Comorbidities DM, COPD, HTN   ? PT Frequency 2x / week   ? PT Duration 8 weeks   ? PT Treatment/Interventions ADLs/Self Care Home  Management;Cryotherapy;Electrical Stimulation;Moist Heat;Gait training;Stair training;Functional mobility training;Therapeutic activities;Therapeutic exercise;Balance training;Neuromuscular re-education;Manual techniques;Patient/family education;Passive range of motion;Dry needling;Taping;Vasopneumatic Device   ? PT Next Visit Plan ROM, strength, gait/stairs, modalities for pain prn   ? PT Home Exercise Plan [id number]   ? Consulted and Agree with Plan of Care Patient   ? ?  ?  ? ?  ? ? ?Patient will benefit from skilled therapeutic intervention in order to improve the following deficits and impairments:  Decreased range of motion, Abnormal gait, Increased muscle spasms, Pain, Impaired flexibility, Decreased strength, Increased edema ? ?Visit Diagnosis: ?Stiffness of right knee, not elsewhere classified ? ?Acute pain of right knee ? ?Localized edema ? ?Muscle weakness (generalized) ? ?Difficulty in walking, not elsewhere classified ? ? ? ? ?Problem List ?Patient Active Problem List  ? Diagnosis Date Noted  ? 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 Severe persistent asthma 10/05/2020  ? 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 Allergic reaction 04/26/2020  ? Acute renal failure superimposed on stage 3 chronic kidney disease ([place]) 04/26/2020  ? Stomatitis 04/26/2020  ? Allergic reaction caused by a drug 04/26/2020  ? Rash   ? Left knee DJD 02/05/2020  ? Acute bronchitis 08/10/2014  ? Unstable angina ([place]) 05/29/2014  ? Abnormal nuclear stress test 05/29/2014  ? Chest pain 05/06/2014  ? Exertional shortness of breath 05/06/2014  ? Mixed hyperlipidemia 05/06/2014  ? Hyperlipidemia with target LDL less than 70 08/05/2013  ? HTN (hypertension) 08/05/2013  ? Chest wall pain 08/05/2013  ? SINUSITIS, ACUTE 04/07/2009  ? HYPOTHYROIDISM 12/10/2007  ? Type 2 diabetes mellitus with hemoglobin A1c goal of less than 7.0% ([place]) 05/28/2007  ? Obstructive sleep apnea 05/28/2007  ? Coronary atherosclerosis 05/28/2007  ? Seasonal and  perennial allergic rhinitis 05/28/2007  ? COUGH, CHRONIC 05/28/2007  ? ? ?Artist Pais, PTA ?11/09/2021, 12:09 PM ? ?[place] ?[facility] ?[place] ?[place]

## 2021-11-11 ENCOUNTER — Ambulatory Visit: Payer: Medicare Other

## 2021-11-15 ENCOUNTER — Telehealth: Payer: Self-pay | Admitting: Cardiovascular Disease

## 2021-11-15 ENCOUNTER — Other Ambulatory Visit: Payer: Self-pay

## 2021-11-15 ENCOUNTER — Emergency Department (HOSPITAL_COMMUNITY): Payer: Medicare Other

## 2021-11-15 ENCOUNTER — Emergency Department (HOSPITAL_COMMUNITY)
Admission: EM | Admit: 2021-11-15 | Discharge: 2021-11-15 | Disposition: A | Discharge status: Home / Self Care | Payer: Medicare Other | Attending: Emergency Medicine | Admitting: Emergency Medicine

## 2021-11-15 DIAGNOSIS — R110 Nausea: Secondary | ICD-10-CM | POA: Insufficient documentation

## 2021-11-15 DIAGNOSIS — R748 Abnormal levels of other serum enzymes: Secondary | ICD-10-CM | POA: Diagnosis not present

## 2021-11-15 DIAGNOSIS — R072 Precordial pain: Secondary | ICD-10-CM | POA: Insufficient documentation

## 2021-11-15 DIAGNOSIS — Z7982 Long term (current) use of aspirin: Secondary | ICD-10-CM | POA: Diagnosis not present

## 2021-11-15 DIAGNOSIS — R1013 Epigastric pain: Secondary | ICD-10-CM | POA: Insufficient documentation

## 2021-11-15 DIAGNOSIS — R1010 Upper abdominal pain, unspecified: Secondary | ICD-10-CM | POA: Diagnosis not present

## 2021-11-15 DIAGNOSIS — R079 Chest pain, unspecified: Secondary | ICD-10-CM

## 2021-11-15 DIAGNOSIS — I2510 Atherosclerotic heart disease of native coronary artery without angina pectoris: Secondary | ICD-10-CM | POA: Diagnosis not present

## 2021-11-15 LAB — CBC WITH DIFFERENTIAL/PLATELET
Abs Immature Granulocytes: 0.03 10*3/uL (ref 0.00–0.07)
Basophils Absolute: 0 10*3/uL (ref 0.0–0.1)
Basophils Relative: 0 %
Eosinophils Absolute: 0.1 10*3/uL (ref 0.0–0.5)
Eosinophils Relative: 1 %
HCT: 36.2 % (ref 36.0–46.0)
Hemoglobin: 11.1 g/dL — ABNORMAL LOW (ref 12.0–15.0)
Immature Granulocytes: 0 %
Lymphocytes Relative: 23 %
Lymphs Abs: 1.6 10*3/uL (ref 0.7–4.0)
MCH: 24.4 pg — ABNORMAL LOW (ref 26.0–34.0)
MCHC: 30.7 g/dL (ref 30.0–36.0)
MCV: 79.7 fL — ABNORMAL LOW (ref 80.0–100.0)
Monocytes Absolute: 0.7 10*3/uL (ref 0.1–1.0)
Monocytes Relative: 10 %
Neutro Abs: 4.5 10*3/uL (ref 1.7–7.7)
Neutrophils Relative %: 66 %
Platelets: 307 10*3/uL (ref 150–400)
RBC: 4.54 MIL/uL (ref 3.87–5.11)
RDW: 15.8 % — ABNORMAL HIGH (ref 11.5–15.5)
WBC: 6.9 10*3/uL (ref 4.0–10.5)
nRBC: 0 % (ref 0.0–0.2)

## 2021-11-15 LAB — LIPASE, BLOOD: Lipase: 65 U/L — ABNORMAL HIGH (ref 11–51)

## 2021-11-15 LAB — COMPREHENSIVE METABOLIC PANEL
ALT: 10 U/L (ref 0–44)
AST: 18 U/L (ref 15–41)
Albumin: 3.8 g/dL (ref 3.5–5.0)
Alkaline Phosphatase: 102 U/L (ref 38–126)
Anion gap: 11 (ref 5–15)
BUN: 40 mg/dL — ABNORMAL HIGH (ref 8–23)
CO2: 23 mmol/L (ref 22–32)
Calcium: 8.7 mg/dL — ABNORMAL LOW (ref 8.9–10.3)
Chloride: 102 mmol/L (ref 98–111)
Creatinine, Ser: 2.21 mg/dL — ABNORMAL HIGH (ref 0.44–1.00)
GFR, Estimated: 24 mL/min — ABNORMAL LOW (ref >60)
Glucose, Bld: 100 mg/dL — ABNORMAL HIGH (ref 70–99)
Potassium: 3.2 mmol/L — ABNORMAL LOW (ref 3.5–5.1)
Sodium: 136 mmol/L (ref 135–145)
Total Bilirubin: 0.7 mg/dL (ref 0.3–1.2)
Total Protein: 7.9 g/dL (ref 6.5–8.1)

## 2021-11-15 LAB — TROPONIN I (HIGH SENSITIVITY)
Troponin I (High Sensitivity): 7 ng/L (ref <18)
Troponin I (High Sensitivity): 8 ng/L (ref <18)

## 2021-11-15 MED ORDER — ALUM & MAG HYDROXIDE-SIMETH 200-200-20 MG/5ML PO SUSP
30.0000 mL | Freq: Once | ORAL | Status: AC
  Administered 2021-11-15: 30 mL via ORAL
  Filled 2021-11-15: qty 30

## 2021-11-15 NOTE — Telephone Encounter (Signed)
Spoke with Wannetta Sender (cardmaster) to provide update on patient ?

## 2021-11-15 NOTE — Telephone Encounter (Signed)
Pt c/o of Chest Pain: STAT if CP now or developed within 24 hours ? ?1. Are you having CP right now? Yes , she stated it is getting worse as each day progresses  ? ?2. Are you experiencing any other symptoms (ex. SOB, nausea, vomiting, sweating)? Pt stated she is feeling weak and Nausea,  ? ?3. How long have you been experiencing CP? About a week ? ?4. Is your CP continuous or coming and going? Coming and going  ? ?5. Have you taken Nitroglycerin? 3 over the last week ? ? ? ? ??  ?

## 2021-11-15 NOTE — ED Provider Triage Note (Signed)
Emergency Medicine Provider Triage Evaluation Note ? ?[name] , a 66 y.o. female  was evaluated in triage.  Pt complains of gradual onset, intermittent, substernal chest pain with radiation into L jaw and epigastric region. Pt also complains of nausea, no vomiting. Has been taking NTG intermittently for same (has Rx for angina). Last took NTG this AM. No SOB. ? ?Review of Systems  ?Positive: + chest pain, abd pain, nausea ?Negative: - SOB, vomiting ? ?Physical Exam  ?BP 121/68 (BP Location: Left Arm)   Pulse 72   Temp (!) 97.5 ?F (36.4 ?C) (Oral)   Resp 16   SpO2 96%  ?Gen:   Awake, no distress   ?Resp:  Normal effort  ?MSK:   Moves extremities without difficulty  ?Other:   ? ?Medical Decision Making  ?Medically screening exam initiated at 3:32 PM.  Appropriate orders placed.  [name] was informed that the remainder of the evaluation will be completed by another provider, this initial triage assessment does not replace that evaluation, and the importance of remaining in the ED until their evaluation is complete. ? ? ?  ?Denise Maize, PA-C ?11/15/21 1533 ? ?

## 2021-11-15 NOTE — ED Triage Notes (Signed)
Pt reports intermittent central chest pain with radiation into jaw and abdomen x 2 weeks. Pain worse with exertion, endorses sob with exertion as well. States is frequently belching, which sometimes relieves the pain.  ?

## 2021-11-15 NOTE — ED Notes (Signed)
Pt verbalized understanding of d/c instructions, meds, and followup care. Denies questions. VSS, no distress noted. Steady gait to exit with all belongings.  ?

## 2021-11-15 NOTE — Telephone Encounter (Signed)
Spoke with patient of Dr. Claiborne Billings  ?She reports chest pain that has been occurring x1 week and has progressed ?She has had nausea, indigestion ?She has used NTG ?She has known CAD ? ?Advised she should go to [facility] ED for evaluation  ? ?Left page for cardmaster to call back to update on patient's situation  ?

## 2021-11-15 NOTE — Discharge Instructions (Signed)
You were seen in the emergency department for on and off upper abdomen and chest pain.  You had blood work EKG and a chest x-ray that did not show any evidence of heart attack.  This is likely indigestion.  Please continue your regular medications and follow-up with your primary care doctor.  You can try Maalox in between meals and at bedtime.  Return if any worsening or concerning symptoms ?

## 2021-11-15 NOTE — ED Provider Notes (Signed)
?[facility] ?Provider Note ? ? ?CSN: [id number] ?Arrival date & time: 11/15/21  1431 ? ?  ? ?History ? ?Chief Complaint  ?Patient presents with  ? Chest Pain  ? ? ?[name] is a 66 y.o. female.  She has a history of coronary disease and stent.  Complaining of on and off chest pain and upper abdominal pain that is been going on for 2 weeks.  Feels like indigestion.  Also feels like when she needed her cardiac stent.  Nausea no vomiting.  No fevers or chills no shortness of breath.  Has tried her Dexilant without improvement. ? ?The history is provided by the patient.  ?Chest Pain ?Pain location:  Substernal area and epigastric ?Pain quality: burning   ?Pain radiates to:  Does not radiate ?Pain severity:  Moderate ?Onset quality:  Gradual ?Duration:  2 weeks ?Timing:  Intermittent ?Progression:  Unchanged ?Chronicity:  Recurrent ?Context: at rest   ?Relieved by:  Nothing ?Worsened by:  Nothing ?Ineffective treatments:  Nitroglycerin ?Associated symptoms: abdominal pain, heartburn and nausea   ?Associated symptoms: no back pain, no cough, no diaphoresis, no fever, no headache, no shortness of breath and no vomiting   ?Risk factors: coronary artery disease   ? ?  ? ?Home Medications ?Prior to Admission medications   ?Medication Sig Start Date End Date Taking? Authorizing Provider  ?albuterol (PROVENTIL HFA) 108 (90 Base) MCG/ACT inhaler Inhale 2 puffs into the lungs every 6 (six) hours as needed for wheezing or shortness of breath. For shortness of breath and wheezing 11/27/20   Sherrilyn Rist A, MD  ?Ascorbic Acid (VITAMIN C PO) Take 1 tablet by mouth daily.    [provider]  ?aspirin EC 81 MG tablet Take 1 tablet (81 mg total) by mouth 2 (two) times daily after a meal. Day after surgery 10/01/21   Susa Day, MD  ?BIOTIN PO Take 500 mg by mouth daily.    [provider]  ?Cyanocobalamin (B-12 PO) Take 1,000 mcg by mouth daily.    [provider]  ?Dexlansoprazole 30 MG capsule DR Take 30 mg by mouth daily. 05/29/21   [provider]  ?diazepam (VALIUM) 5 MG tablet Take 5 mg by mouth daily as needed for muscle spasms (cramps). 02/16/20   [provider]  ?docusate sodium (COLACE) 100 MG capsule Take 1 capsule (100 mg total) by mouth 2 (two) times daily as needed for mild constipation. 10/01/21   Susa Day, MD  ?ferrous sulfate 325 (65 FE) MG tablet Take 325 mg by mouth daily with breakfast.    [provider]  ?Finerenone (KERENDIA) 10 MG TABS Take 10 mg by mouth daily.    [provider]  ?fluticasone (FLONASE) 50 MCG/ACT nasal spray Place 2 sprays into both nostrils daily. ?Patient taking differently: Place 2 sprays into both nostrils daily as needed for allergies. 09/05/19   Julian Hy, DO  ?fluticasone-salmeterol (ADVAIR HFA) 230-21 MCG/ACT inhaler Inhale 2 puffs into the lungs 2 (two) times daily. 04/29/21   Laurin Coder, MD  ?furosemide (LASIX) 40 MG tablet Take 1 tablet (40 mg total) by mouth daily. In the morning ?Patient taking differently: Take 40-60 mg by mouth See admin instructions. Take 60 mg in the morning and 20 mg bedtime 01/20/21   Troy Sine, MD  ?hydrocortisone 2.5 % cream Apply topically 2 (two) times daily. Apply as needed two times daily 10/02/21   Jonelle Sidle D, PA  ?insulin  lispro (HUMALOG) 100 UNIT/ML KwikPen Inject 5-10 Units into the skin See admin instructions. Inject 5 units subcutaneously up to three times daily if CBG >200, inject 10 units up to three times daily if CBG >300    [provider]  ?ipratropium-albuterol (DUONEB) 0.5-2.5 (3) MG/3ML SOLN Take 3 mLs by nebulization every 4 (four) hours as needed. 11/19/20   Parrett, Fonnie Mu, NP  ?isosorbide mononitrate (IMDUR) 30 MG 24 hr tablet Take 1 tablet (30 mg total) by mouth daily. ?Patient not taking: Reported on 09/16/2021 08/10/21   Troy Sine, MD  ?isosorbide mononitrate (IMDUR) 60 MG 24 hr  tablet Take 60 mg by mouth daily.    [provider]  ?JARDIANCE 10 MG TABS tablet Take 10 mg by mouth daily. 06/24/20   [provider]  ?Lancets (ONETOUCH DELICA PLUS [id number]) MISC Apply 1 each topically 4 (four) times daily. 06/24/20   [provider]  ?levothyroxine (SYNTHROID) 150 MCG tablet Take 150 mcg by mouth daily before breakfast.    [provider]  ?Mepolizumab (NUCALA) 100 MG/ML SOAJ Inject 1 mL (100 mg total) into the skin every 28 (twenty-eight) days. 10/18/21   Laurin Coder, MD  ?methocarbamol (ROBAXIN) 500 MG tablet Take 1 tablet (500 mg total) by mouth every 8 (eight) hours as needed for muscle spasms. 10/01/21   Susa Day, MD  ?metoprolol succinate (TOPROL-XL) 25 MG 24 hr tablet TAKE 1 AND 1/2 TABLETS BY MOUTH TWICE A DAY 11/05/21   Troy Sine, MD  ?montelukast (SINGULAIR) 10 MG tablet Take 1 tablet (10 mg total) by mouth at bedtime. 01/26/21   Laurin Coder, MD  ?Multiple Vitamin (MULTIVITAMIN WITH MINERALS) TABS tablet Take 1 tablet by mouth daily.    [provider]  ?nitroGLYCERIN (NITROLINGUAL) 0.4 MG/SPRAY spray Place 1 spray under the tongue every 5 (five) minutes x 3 doses as needed for chest pain. 08/24/20   Almyra Deforest, [place]  ?NOVOFINE PLUS PEN NEEDLE 32G X 4 MM MISC Inject into the skin. 06/24/20   [provider]  ?ONE TOUCH ULTRA TEST test strip Use as directed 05/05/11   [provider]  ?oxyCODONE (OXY IR/ROXICODONE) 5 MG immediate release tablet Take 1 tablet (5 mg total) by mouth every 4 (four) hours as needed for severe pain. 10/01/21   Susa Day, MD  ?polyethylene glycol (MIRALAX / GLYCOLAX) 17 g packet Take 17 g by mouth daily. 10/01/21   Susa Day, MD  ?potassium chloride SA (KLOR-CON) 20 MEQ tablet Take 20 mEq by mouth every Monday, Wednesday, and Friday.    [provider]  ?pregabalin (LYRICA) 100 MG capsule Take 100 mg by mouth 2 (two) times daily.    [provider]   ?Pyridoxine HCl (B-6 PO) Take 1 tablet by mouth daily.    [provider]  ?ranolazine (RANEXA) 500 MG 12 hr tablet Take 1 tablet (500 mg total) by mouth 2 (two) times daily. 11/04/21   Troy Sine, MD  ?Respiratory Therapy Supplies (FLUTTER) DEVI 1 puff by Does not apply route daily. 10/01/19   Julian Hy, DO  ?tirzepatide Darcel Bayley) 5 MG/0.5ML Pen Inject 5 mg into the skin once a week. 05/25/21   [provider]  ?traMADol (ULTRAM) 50 MG tablet Take 50 mg by mouth 3 (three) times daily as needed for pain. 10/09/20   [provider]  ?triamterene-hydrochlorothiazide (MAXZIDE-25) 37.5-25 MG tablet TAKE ONE TABLET BY MOUTH DAILY 09/28/21   Troy Sine, MD  ?  verapamil (CALAN-SR) 120 MG CR tablet Take 120 mg by mouth at bedtime.    [provider]  ?   ? ?Allergies    ?Allopurinol, Codeine, Levaquin [levofloxacin], Sulfa antibiotics, and Sulfonamide derivatives   ? ?Review of Systems   ?Review of Systems  ?Constitutional:  Negative for diaphoresis and fever.  ?HENT:  Negative for sore throat.   ?Eyes:  Negative for visual disturbance.  ?Respiratory:  Negative for cough and shortness of breath.   ?Cardiovascular:  Positive for chest pain.  ?Gastrointestinal:  Positive for abdominal pain, heartburn and nausea. Negative for vomiting.  ?Genitourinary:  Negative for dysuria.  ?Musculoskeletal:  Negative for back pain.  ?Skin:  Negative for rash.  ?Neurological:  Negative for headaches.  ? ?Physical Exam ?Updated Vital Signs ?BP 121/68 (BP Location: Left Arm)   Pulse 72   Temp (!) 97.5 ?F (36.4 ?C) (Oral)   Resp 16   SpO2 96%  ?Physical Exam ?Vitals and nursing note reviewed.  ?Constitutional:   ?   General: She is not in acute distress. ?   Appearance: She is well-developed.  ?HENT:  ?   Head: Normocephalic and atraumatic.  ?Eyes:  ?   Conjunctiva/sclera: Conjunctivae normal.  ?Cardiovascular:  ?   Rate and Rhythm: Normal rate and regular rhythm.  ?   Heart sounds: No murmur  heard. ?Pulmonary:  ?   Effort: Pulmonary effort is normal. No respiratory distress.  ?   Breath sounds: Normal breath sounds.  ?Abdominal:  ?   Palpations: Abdomen is soft.  ?   Tenderness: There is no abdominal tendernes

## 2021-11-16 ENCOUNTER — Other Ambulatory Visit: Payer: Self-pay

## 2021-11-16 ENCOUNTER — Ambulatory Visit: Payer: Medicare Other | Admitting: Physical Therapy

## 2021-11-16 ENCOUNTER — Encounter: Payer: Self-pay | Admitting: Physical Therapy

## 2021-11-16 ENCOUNTER — Telehealth: Payer: Self-pay | Admitting: Cardiovascular Disease

## 2021-11-16 DIAGNOSIS — R262 Difficulty in walking, not elsewhere classified: Secondary | ICD-10-CM

## 2021-11-16 DIAGNOSIS — M6281 Muscle weakness (generalized): Secondary | ICD-10-CM

## 2021-11-16 DIAGNOSIS — M25661 Stiffness of right knee, not elsewhere classified: Secondary | ICD-10-CM

## 2021-11-16 DIAGNOSIS — M25561 Pain in right knee: Secondary | ICD-10-CM

## 2021-11-16 DIAGNOSIS — R600 Localized edema: Secondary | ICD-10-CM

## 2021-11-16 DIAGNOSIS — M25562 Pain in left knee: Secondary | ICD-10-CM

## 2021-11-16 MED ORDER — METOPROLOL SUCCINATE ER 25 MG PO TB24: ORAL_TABLET | ORAL | 3 refills | Status: DC

## 2021-11-16 NOTE — Therapy (Signed)
[place] [facility] ?[address] ?[place], [name], [address] ?Phone: [phone]   Fax:  [phone] ? ?Physical Therapy Treatment ? ?Patient Details  ?[name] ?MRN: [id number] ?Date of Birth: 10/13/1955 ?Referring Provider (PT): Lacie Draft PA-C/Jeffrey Beane MD ? ? ?Encounter Date: 11/16/2021 ? ? PT End of Session - 11/16/21 1104   ? ? Visit Number 3   ? Date for PT Re-Evaluation 12/28/21   ? Authorization Type UHC MCR   ? PT Start Time 1104   ? PT Stop Time 1155   5 min delay for vaso set up  ? PT Time Calculation (min) 51 min   ? Activity Tolerance Patient tolerated treatment well   ? Behavior During Therapy [facility] for tasks assessed/performed   ? ?  ?  ? ?  ? ? ?Past Medical History:  ?Diagnosis Date  ? Acute bronchitis   ? Anemia   ? hx of anemia  ? Anginal pain ([place])   ? Arthritis   ? osteo  ? Asthma   ? Chronic kidney disease (CKD), stage IV (severe) (HCC)   ? Coronary atherosclerosis   ? Cough   ? GERD (gastroesophageal reflux disease)   ? History of colon polyps   ? History of migraine   ? Hyperlipidemia   ? Hypertension   ? Obstructive sleep apnea (adult) (pediatric)   ? NO CPAP   ? Peripheral neuropathy   ? Type II or unspecified type diabetes mellitus without mention of complication, not stated as uncontrolled   ? type 2  ? Unspecified hypothyroidism   ? ? ?Past Surgical History:  ?Procedure Laterality Date  ? ANTERIOR FUSION CERVICAL SPINE  2009  ? CARPAL TUNNEL RELEASE Left   ? COLONOSCOPY    ? CORONARY STENT INTERVENTION  2007  ? LEFT HEART CATHETERIZATION WITH CORONARY ANGIOGRAM N/A 05/29/2014  ? Procedure: LEFT HEART CATHETERIZATION WITH CORONARY ANGIOGRAM;  Surgeon: Sinclair Grooms, MD;  Location: [facility] CATH LAB;  Service: Cardiovascular;  Laterality: N/A;  ? 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  ? Procedure: TOTAL KNEE ARTHROPLASTY;  Surgeon: Susa Day, MD;  Location: [facility] ORS;  Service: Orthopedics;  Laterality: Left;   3 hrs  ? TOTAL KNEE ARTHROPLASTY Right 10/01/2021  ? Procedure: TOTAL KNEE ARTHROPLASTY;  Surgeon: Susa Day, MD;  Location: [facility] ORS;  Service: Orthopedics;  Laterality: Right;  ? TUBAL LIGATION  1990  ? WISDOM TOOTH EXTRACTION    ? ? ?There were no vitals filed for this visit. ? ? Subjective Assessment - 11/16/21 1104   ? ? Subjective Pt with no c/o of knee pain today, just some stiffness. She reports pain in her right shoulder and difficulty raising it for [facility] ADLs. She plans to call MD to see if we can work on this in PT also.   ? Pertinent History Left TKA, ACDF, DM, COPD, OA, HTN   ? Patient Stated Goals Get the right knee like the left   ? Currently in Pain? No/denies   ? ?  ?  ? ?  ? ? ? ? ? ? ? ? ? ? ? ? ? ? ? ? ? ? ? ? [facility] Adult PT Treatment/Exercise - 11/16/21 0001   ? ?  ? Knee/Hip Exercises: Aerobic  ? Recumbent Bike L2x8min   ?  ? Knee/Hip Exercises: Machines for Strengthening  ? Cybex Knee Extension 10# B x 10; B up; Rt down x 10   ?  Cybex Knee Flexion 10# B x 10; R x 10   ? Cybex Leg Press 15# B x 10; 5# R only 2 x10   ?  ? Knee/Hip Exercises: Standing  ? Lateral Step Up Right;10 reps;Step Height: 6";Hand Hold: 1   ? Forward Step Up Right;2 sets;10 reps;Hand Hold: 1;Hand Hold: 2;Step Height: 6";Step Height: 8"   ? Forward Step Up Limitations cues for hip/knee ext and slow eccentric step down; 1 set at each ht.   ? Step Down Left;10 reps;Step Height: 6";Hand Hold: 1   ? Step Down Limitations for eccentric quad work on right   attempted heel taps on 6 in and 4 in steps but difficult and intermittent pain  ? Other Standing Knee Exercises mini SL squat Rt with 25% toe touch left x 10   ?  ? Modalities  ? Modalities Vasopneumatic   ?  ? Vasopneumatic  ? Number Minutes Vasopneumatic  10 minutes   ? Vasopnuematic Location  Knee   ? Vasopneumatic Pressure Medium   ? Vasopneumatic Temperature  34   ? ?  ?  ? ?  ? ? ? ? ? ? ? ? ? ? ? ? PT Short Term Goals - 11/09/21 1154   ? ?  ? PT SHORT TERM GOAL #1  ?  Title Patient to be independent with initial HEP.   ? Time 3   ? Period Weeks   ? Status On-going   ? Target Date 11/23/21   ? ?  ?  ? ?  ? ? ? ? PT Long Term Goals - 11/16/21 1152   ? ?  ? PT LONG TERM GOAL #3  ? Title Patient able to climb stairs with 5/5 right knee stength and a reciprocal gait pattern.   ? Status On-going   ? ?  ?  ? ?  ? ? ? ? ? ? ? ? Plan - 11/16/21 1153   ? ? Clinical Impression Statement [name] did well with eccentric quad strengthening today on both the machines and functionally. She is able to improve control on 6 inch step down with VC. She still has weakness and pain with SL heel taps.   ? PT Frequency 2x / week   ? PT Duration 8 weeks   ? PT Treatment/Interventions ADLs/Self Care Home Management;Cryotherapy;Electrical Stimulation;Moist Heat;Gait training;Stair training;Functional mobility training;Therapeutic activities;Therapeutic exercise;Balance training;Neuromuscular re-education;Manual techniques;Patient/family education;Passive range of motion;Dry needling;Taping;Vasopneumatic Device   ? PT Next Visit Plan eccentric quad strength, try gait with obstacles   ? Consulted and Agree with Plan of Care Patient   ? ?  ?  ? ?  ? ? ?Patient will benefit from skilled therapeutic intervention in order to improve the following deficits and impairments:  Decreased range of motion, Abnormal gait, Increased muscle spasms, Pain, Impaired flexibility, Decreased strength, Increased edema ? ?Visit Diagnosis: ?Stiffness of right knee, not elsewhere classified ? ?Acute pain of right knee ? ?Localized edema ? ?Muscle weakness (generalized) ? ?Difficulty in walking, not elsewhere classified ? ?Acute pain of left knee ? ? ? ? ?Problem List ?Patient Active Problem List  ? Diagnosis Date Noted  ? 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 Severe persistent asthma 10/05/2020  ? 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 Allergic reaction 04/26/2020  ? Acute renal failure superimposed on stage 3 chronic kidney  disease ([place]) 04/26/2020  ? Stomatitis 04/26/2020  ? Allergic reaction caused by a drug 04/26/2020  ? Rash   ? Left knee DJD  02/05/2020  ? Acute bronchitis 08/10/2014  ? Unstable angina ([place]) 05/29/2014  ? Abnormal nuclear stress test 05/29/2014  ? Chest pain 05/06/2014  ? Exertional shortness of breath 05/06/2014  ? Mixed hyperlipidemia 05/06/2014  ? Hyperlipidemia with target LDL less than 70 08/05/2013  ? HTN (hypertension) 08/05/2013  ? Chest wall pain 08/05/2013  ? SINUSITIS, ACUTE 04/07/2009  ? HYPOTHYROIDISM 12/10/2007  ? Type 2 diabetes mellitus with hemoglobin A1c goal of less than 7.0% ([place]) 05/28/2007  ? Obstructive sleep apnea 05/28/2007  ? Coronary atherosclerosis 05/28/2007  ? Seasonal and perennial allergic rhinitis 05/28/2007  ? COUGH, CHRONIC 05/28/2007  ? ?Madelyn Flavors, PT ?11/16/2021, 11:57 AM ? ?[place] ?[facility] ?[address] ?[place], [name], [address] ?Phone: [phone]   Fax:  [phone] ? ?[name] ?MRN: [id number] ?Date of Birth: Oct 22, 1955 ? ? ? ?

## 2021-11-16 NOTE — Telephone Encounter (Signed)
?*  STAT* If patient is at the pharmacy, call can be transferred to refill team. ? ? ?1. Which medications need to be refilled? (please list name of each medication and dose if known) new prescription, changing pharmacy for Metoprolol ? ?2. Which pharmacy/location (including street and city if local pharmacy) is medication to be sent to?[facility]- [phone] ? ?3. Do they need a 30 day or 90 day supply? 90 days and refills ? ?

## 2021-11-18 ENCOUNTER — Other Ambulatory Visit (HOSPITAL_COMMUNITY): Payer: Self-pay

## 2021-11-18 ENCOUNTER — Telehealth: Payer: Self-pay

## 2021-11-18 NOTE — Telephone Encounter (Signed)
Spoke with pt regarding recent low blood pressures and feeling run down. Pt was recently seen in the ED for chest pain but all things at that time checked out. Pt states that her pressure has been around 91/54, pt wonders if her medications need to be changed. Today blood pressure is 126/72, but she has not taken her metoprolol today. Pt requesting appointment. Able to schedule office visit for pt. Pt verbalizes understanding.  ? ? ?Pt walked in to clinic today and left a message at the desk requesting a call back. ? ? ?

## 2021-11-19 ENCOUNTER — Ambulatory Visit (INDEPENDENT_AMBULATORY_CARE_PROVIDER_SITE_OTHER): Payer: Medicare Other | Admitting: Nurse Practitioner

## 2021-11-19 ENCOUNTER — Encounter: Payer: Self-pay | Admitting: Nurse Practitioner

## 2021-11-19 ENCOUNTER — Ambulatory Visit: Payer: Medicare Other

## 2021-11-19 VITALS — BP 124/72 | HR 80 | Ht 62.0 in | Wt 155.8 lb

## 2021-11-19 DIAGNOSIS — I10 Essential (primary) hypertension: Secondary | ICD-10-CM

## 2021-11-19 DIAGNOSIS — E876 Hypokalemia: Secondary | ICD-10-CM

## 2021-11-19 DIAGNOSIS — I951 Orthostatic hypotension: Secondary | ICD-10-CM | POA: Diagnosis not present

## 2021-11-19 DIAGNOSIS — E785 Hyperlipidemia, unspecified: Secondary | ICD-10-CM

## 2021-11-19 DIAGNOSIS — M6281 Muscle weakness (generalized): Secondary | ICD-10-CM

## 2021-11-19 DIAGNOSIS — R262 Difficulty in walking, not elsewhere classified: Secondary | ICD-10-CM

## 2021-11-19 DIAGNOSIS — R600 Localized edema: Secondary | ICD-10-CM

## 2021-11-19 DIAGNOSIS — I2510 Atherosclerotic heart disease of native coronary artery without angina pectoris: Secondary | ICD-10-CM | POA: Diagnosis not present

## 2021-11-19 DIAGNOSIS — E118 Type 2 diabetes mellitus with unspecified complications: Secondary | ICD-10-CM

## 2021-11-19 DIAGNOSIS — N184 Chronic kidney disease, stage 4 (severe): Secondary | ICD-10-CM

## 2021-11-19 DIAGNOSIS — Z794 Long term (current) use of insulin: Secondary | ICD-10-CM

## 2021-11-19 DIAGNOSIS — M25561 Pain in right knee: Secondary | ICD-10-CM

## 2021-11-19 DIAGNOSIS — M25661 Stiffness of right knee, not elsewhere classified: Secondary | ICD-10-CM

## 2021-11-19 DIAGNOSIS — G4733 Obstructive sleep apnea (adult) (pediatric): Secondary | ICD-10-CM

## 2021-11-19 MED ORDER — ISOSORBIDE MONONITRATE ER 30 MG PO TB24: 30.0000 mg | ORAL_TABLET | Freq: Every day | ORAL | 6 refills | Status: DC

## 2021-11-19 MED ORDER — METOPROLOL SUCCINATE ER 25 MG PO TB24: 37.5000 mg | ORAL_TABLET | Freq: Every day | ORAL | 3 refills | Status: DC

## 2021-11-19 NOTE — Therapy (Signed)
[place] ?[facility] ?[address] ?[place], [name], [address] ?Phone: [phone]   Fax:  [phone] ? ?Physical Therapy Treatment ? ?Patient Details  ?[name] ?MRN: [id number] ?Date of Birth: 11-21-1955 ?Referring Provider (PT): Lacie Draft PA-C/Jeffrey Beane MD ? ? ?Encounter Date: 11/19/2021 ? ? PT End of Session - 11/19/21 1149   ? ? Visit Number 4   ? Date for PT Re-Evaluation 12/28/21   ? Authorization Type UHC MCR   ? Progress Note Due on Visit 10   ? PT Start Time 1103   ? PT Stop Time 1146   ? PT Time Calculation (min) 43 min   ? Activity Tolerance Patient tolerated treatment well   ? Behavior During Therapy [facility] for tasks assessed/performed   ? ?  ?  ? ?  ? ? ?Past Medical History:  ?Diagnosis Date  ? Acute bronchitis   ? Anemia   ? hx of anemia  ? Anginal pain ([place])   ? Arthritis   ? osteo  ? Asthma   ? Chronic kidney disease (CKD), stage IV (severe) (HCC)   ? Coronary atherosclerosis   ? Cough   ? GERD (gastroesophageal reflux disease)   ? History of colon polyps   ? History of migraine   ? Hyperlipidemia   ? Hypertension   ? Obstructive sleep apnea (adult) (pediatric)   ? NO CPAP   ? Peripheral neuropathy   ? Type II or unspecified type diabetes mellitus without mention of complication, not stated as uncontrolled   ? type 2  ? Unspecified hypothyroidism   ? ? ?Past Surgical History:  ?Procedure Laterality Date  ? ANTERIOR FUSION CERVICAL SPINE  2009  ? CARPAL TUNNEL RELEASE Left   ? COLONOSCOPY    ? CORONARY STENT INTERVENTION  2007  ? LEFT HEART CATHETERIZATION WITH CORONARY ANGIOGRAM N/A 05/29/2014  ? Procedure: LEFT HEART CATHETERIZATION WITH CORONARY ANGIOGRAM;  Surgeon: Sinclair Grooms, MD;  Location: [facility] CATH LAB;  Service: Cardiovascular;  Laterality: N/A;  ? 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  ? Procedure: TOTAL KNEE ARTHROPLASTY;  Surgeon: Susa Day, MD;  Location: [facility] ORS;  Service: Orthopedics;  Laterality:  Left;  3 hrs  ? TOTAL KNEE ARTHROPLASTY Right 10/01/2021  ? Procedure: TOTAL KNEE ARTHROPLASTY;  Surgeon: Susa Day, MD;  Location: [facility];  Service: Orthopedics;  Laterality: Right;  ? TUBAL LIGATION  1990  ? WISDOM TOOTH EXTRACTION    ? ? ?There were no vitals filed for this visit. ? ? Subjective Assessment - 11/19/21 1105   ? ? Subjective Pt reports no pain today, still walking with no AD.   ? Pertinent History Left TKA, ACDF, DM, COPD, OA, HTN   ? Patient Stated Goals Get the right knee like the left   ? Currently in Pain? No/denies   ? ?  ?  ? ?  ? ? ? ? ? [facility] PT Assessment - 11/19/21 0001   ? ?  ? AROM  ? Right Knee Extension 1   ? Right Knee Flexion 116   ? ?  ?  ? ?  ? ? ? ? ? ? ? ? ? ? ? ? ? ? ? ? [facility] Adult PT Treatment/Exercise - 11/19/21 0001   ? ?  ? Knee/Hip Exercises: Aerobic  ? Recumbent Bike L2x45min   ?  ? Knee/Hip Exercises: Machines for Strengthening  ? Cybex Leg Press 20# 2 x 10; 5#  R only 2 x10   ?  ? Knee/Hip Exercises: Standing  ? Lateral Step Up Right;10 reps;Step Height: 6";Hand Hold: 1;2 sets   ? Lateral Step Up Limitations progress on 2nd set to toe tap, avoiding pelvic drop   ? Step Down Hand Hold: 1;Step Height: 6";15 reps   R leg going up, L LE going down  ? Functional Squat 1 set;10 reps   ? Functional Squat Limitations counter support   ?  ? Knee/Hip Exercises: Seated  ? Long [employer] Strengthening;Right;10 reps   ? Long [employer] Limitations GTB   ? Hamstring Curl Strengthening;Right;10 reps   ? Hamstring Limitations GTB   ? ?  ?  ? ?  ? ? ? ? ? ? ? ? ? ? PT Education - 11/19/21 1149   ? ? Education Details HEP update   ? Person(s) Educated Patient   ? Methods Explanation;Demonstration;Handout   ? Comprehension Verbalized understanding;Returned demonstration   ? ?  ?  ? ?  ? ? ? PT Short Term Goals - 11/19/21 1140   ? ?  ? PT SHORT TERM GOAL #1  ? Title Patient to be independent with initial HEP.   ? Time 3   ? Period Weeks   ? Status Achieved   11/19/21  ? Target Date 11/23/21    ? ?  ?  ? ?  ? ? ? ? PT Long Term Goals - 11/16/21 1152   ? ?  ? PT LONG TERM GOAL #3  ? Title Patient able to climb stairs with 5/5 right knee stength and a reciprocal gait pattern.   ? Status On-going   ? ?  ?  ? ?  ? ? ? ? ? ? ? ? Plan - 11/19/21 1150   ? ? Clinical Impression Statement Pt showed a good tolerance for the progression of exercises today with no increased pain. Showed improved ecc quad control descending 6' step although weakness noted towards last few reps. Progressed HEP and gave instruction on exercises. Cues to increased post WS with squats to avoid strain on ant knees. Knee ROM is improving with extension and flexion. Pt declined GR today.   ? Personal Factors and Comorbidities Comorbidity 3+   ? Comorbidities DM, COPD, HTN   ? PT Frequency 2x / week   ? PT Duration 8 weeks   ? PT Treatment/Interventions ADLs/Self Care Home Management;Cryotherapy;Electrical Stimulation;Moist Heat;Gait training;Stair training;Functional mobility training;Therapeutic activities;Therapeutic exercise;Balance training;Neuromuscular re-education;Manual techniques;Patient/family education;Passive range of motion;Dry needling;Taping;Vasopneumatic Device   ? PT Next Visit Plan eccentric quad strength, try gait with obstacles   ? PT Home Exercise Plan [email or web address]   ? Consulted and Agree with Plan of Care Patient   ? ?  ?  ? ?  ? ? ?Patient will benefit from skilled therapeutic intervention in order to improve the following deficits and impairments:  Decreased range of motion, Abnormal gait, Increased muscle spasms, Pain, Impaired flexibility, Decreased strength, Increased edema ? ?Visit Diagnosis: ?Stiffness of right knee, not elsewhere classified ? ?Acute pain of right knee ? ?Localized edema ? ?Muscle weakness (generalized) ? ?Difficulty in walking, not elsewhere classified ? ? ? ? ?Problem List ?Patient Active Problem List  ? Diagnosis Date Noted  ? 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 Severe persistent asthma 10/05/2020  ? TEN  (toxic epidermal necrolysis) ([place]) 04/27/2020  ? Allergic reaction 04/26/2020  ? Acute renal failure superimposed on stage 3 chronic kidney disease ([place]) 04/26/2020  ? Stomatitis 04/26/2020  ?  Allergic reaction caused by a drug 04/26/2020  ? Rash   ? Left knee DJD 02/05/2020  ? Acute bronchitis 08/10/2014  ? Unstable angina ([place]) 05/29/2014  ? Abnormal nuclear stress test 05/29/2014  ? Chest pain 05/06/2014  ? Exertional shortness of breath 05/06/2014  ? Mixed hyperlipidemia 05/06/2014  ? Hyperlipidemia with target LDL less than 70 08/05/2013  ? HTN (hypertension) 08/05/2013  ? Chest wall pain 08/05/2013  ? SINUSITIS, ACUTE 04/07/2009  ? HYPOTHYROIDISM 12/10/2007  ? Type 2 diabetes mellitus with hemoglobin A1c goal of less than 7.0% ([place]) 05/28/2007  ? Obstructive sleep apnea 05/28/2007  ? Coronary atherosclerosis 05/28/2007  ? Seasonal and perennial allergic rhinitis 05/28/2007  ? COUGH, CHRONIC 05/28/2007  ? ? ?Artist Pais, PTA ?11/19/2021, 11:53 AM ? ?[facility] ?[facility] ?[address] ?[place], [name], [address] ?Phone: [phone]   Fax:  [phone] ? ?Name: RAAHI KORBER ?MRN: [id number] ?Date of Birth: 01-Feb-1956 ? ? ? ?

## 2021-11-19 NOTE — Patient Instructions (Signed)
Access Code: [email or web address] ?URL: [email or web address] ?Date: 11/19/2021 ?Prepared by: Clarene Essex ? ?Exercises ?- Seated Knee Flexion Stretch  - 3 x daily - 7 x weekly - 1 sets - 3 reps - 30-60 sec hold ?- Supine Knee Extension Strengthening  - 1 x daily - 7 x weekly - 2 sets - 10 reps ?- Supine Heel Slide with Strap (Mirrored)  - 2 x daily - 7 x weekly - 1-2 sets - 10 reps ?- Supine Hamstring Stretch with Strap  - 2 x daily - 7 x weekly - 1 sets - 3 reps - 60 sec hold ?- Squat with Counter Support  - 1 x daily - 7 x weekly - 3 sets - 10 reps ?- Seated Knee Extension with Resistance  - 1 x daily - 7 x weekly - 3 sets - 10 reps ?- Seated Knee Flexion with Anchored Resistance  - 1 x daily - 7 x weekly - 3 sets - 10 reps ?

## 2021-11-19 NOTE — Patient Instructions (Addendum)
Medication Instructions:  ?DECREASE METOPROLOL SUCC. 1 TAB (25MG ) TWICE DAILY FOR ONE WEEK THEN 37.5MG  DAILY ? ?DECREASE ISOSORBIDE 30MG  DAILY ?*If you need a refill on your cardiac medications before your next appointment, please call your pharmacy* ? ?Lab Work: ?BMET TODAY ?If you have labs (blood work) drawn today and your tests are completely normal, you will receive your results only by: ?MyChart Message (if you have MyChart) OR ?A paper copy in the mail ?If you have any lab test that is abnormal or we need to change your treatment, we will call you to review the results. ? ?Testing/Procedures: ?NONE ? ?Follow-Up: ?At [facility], you and your health needs are our priority.  As part of our continuing mission to provide you with exceptional heart care, we have created designated Provider Care Teams.  These Care Teams include your primary Cardiologist (physician) and Advanced Practice Providers (APPs -  Physician Assistants and Nurse Practitioners) who all work together to provide you with the care you need, when you need it. ? ?We recommend signing up for the patient portal called "MyChart".  Sign up information is provided on this After Visit Summary.  MyChart is used to connect with patients for Virtual Visits (Telemedicine).  Patients are able to view lab/test results, encounter notes, upcoming appointments, etc.  Non-urgent messages can be sent to your provider as well.   ?To learn more about what you can do with MyChart, go to [email or web address].   ? ?Your next appointment:   ?2-3 week(s) ? ?The format for your next appointment:   ?In Person ? ?Provider:   ?Almyra Deforest, PA-C or Diona Browner, NP { ? ?Other Instructions ?FOR ORTHOSTATIC HYPOTENSION WE RECOMMEND: ?-GRADUAL POSITION CHANGES ?-INCREASE YOUR HYDRATION ?-PURCHASE AND WEAR ABDOMINAL BINDER ?-PURCHASE AND WEAR THIGH SLEEVES ?-PURCHASE AND WEAR SPANKS ? ?TAKE AND LOG YOUR BLOOD PRESSURE DAILY AND IF YOU ARE HAVING SYMPTOMS ? ?Important  Information About [place] ? ? ? ? ?  ?

## 2021-11-19 NOTE — Progress Notes (Signed)
? ? ?Office Visit  ?  ?Patient [name] ?Date of Encounter: 11/19/2021 ? ?Primary Care Provider:  Reynold Bowen, MD ?Primary Cardiologist:  Shelva Majestic, MD ? ?Chief Complaint  ?  ?66 year old female with a history of CAD, hypertension, hyperlipidemia, OSA not on CPAP, hypothyroidism, type 2 diabetes, CKD stage IV, asthma, and GERD presents for follow-up related to CAD and hypertension. ? ?Past Medical History  ?  ?Past Medical History:  ?Diagnosis Date  ? Acute bronchitis   ? Anemia   ? hx of anemia  ? Anginal pain ([place])   ? Arthritis   ? osteo  ? Asthma   ? Chronic kidney disease (CKD), stage IV (severe) (HCC)   ? Coronary atherosclerosis   ? Cough   ? GERD (gastroesophageal reflux disease)   ? History of colon polyps   ? History of migraine   ? Hyperlipidemia   ? Hypertension   ? Obstructive sleep apnea (adult) (pediatric)   ? NO CPAP   ? Peripheral neuropathy   ? Type II or unspecified type diabetes mellitus without mention of complication, not stated as uncontrolled   ? type 2  ? Unspecified hypothyroidism   ? ?Past Surgical History:  ?Procedure Laterality Date  ? ANTERIOR FUSION CERVICAL SPINE  2009  ? CARPAL TUNNEL RELEASE Left   ? COLONOSCOPY    ? CORONARY STENT INTERVENTION  2007  ? LEFT HEART CATHETERIZATION WITH CORONARY ANGIOGRAM N/A 05/29/2014  ? Procedure: LEFT HEART CATHETERIZATION WITH CORONARY ANGIOGRAM;  Surgeon: Sinclair Grooms, MD;  Location: [facility] CATH LAB;  Service: Cardiovascular;  Laterality: N/A;  ? 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  ? Procedure: TOTAL KNEE ARTHROPLASTY;  Surgeon: Susa Day, MD;  Location: [facility] ORS;  Service: Orthopedics;  Laterality: Left;  3 hrs  ? TOTAL KNEE ARTHROPLASTY Right 10/01/2021  ? Procedure: TOTAL KNEE ARTHROPLASTY;  Surgeon: Susa Day, MD;  Location: [facility] ORS;  Service: Orthopedics;  Laterality: Right;  ? TUBAL LIGATION  1990  ? WISDOM TOOTH EXTRACTION    ? ? ?Allergies ? ?Allergies  ?Allergen Reactions  ? Allopurinol Rash  ?  Toxic  epidermal necrolysis ?  ? Codeine Other (See Comments)  ?  REACTION: hallucinations/"loopy"  ? Levaquin [Levofloxacin] Nausea And Vomiting  ? Sulfa Antibiotics Hives  ? Sulfonamide Derivatives Hives  ? ? ?History of Present Illness  ?  ?66 year old female with the above past medical history including CAD, hypertension, hyperlipidemia, OSA not on CPAP, hypothyroidism, type 2 diabetes, CKD stage IV, asthma, and GERD. ? ?She is followed by Dr. Claiborne Billings. She has a known history of CAD and is s/p DES-pRCA in 2007, she was noted to have 60-70% LAD and 20% LCx stenosis.  Repeat cardiac catheterization in 2010 in the setting of recurrent chest pain showed 60% LAD stenosis, 20-30% circumflex stenosis and a widely patent RCA stent. More recently she had a high risk stress test in 2015 and underwent repeat heart catheterization at that time showing 50% p-mLAD stenosis with negative FFR , D1 70%, and patent RCA stent. Her last echocardiogram 04/2019 showed EF 60-65%, normal LV diastolic function, mild LVH, and mild AI. Her last echocardiogram 04/2019 showed EF 60-65%, normal LV diastolic function, mild LVH, and mild AI.  She has a history of orthostatic hypotension.  Maxide was previously discontinued in the setting of orthostatic hypotension, however, it was restarted due to worsening lower extremity edema. She was last seen in the office on 08/10/2021 and was stable from a cardiac  standpoint.  Her BP was low, Imdur was decreased from 60 mg daily to 30 mg daily.  She underwent right total knee arthroplasty in February 2023.  She presented to the ED on 11/15/2021 with complaints of epigastric and chest pain.  EKG was unremarkable, troponin was negative.  Her symptoms improved with Maalox. She called our office on 11/18/2021 with complaints of low blood pressure, "feeling rundown." ? ?She presents today for follow-up. Since she called our office and since her recent ED visit she reports ongoing intermittent lightheadedness, presyncope,  with associated shortness of breath and mild chest discomfort. She tells me her symptoms have occurred only in the setting of positional changes.  She has noted low blood pressures recently at home with SBP occasionally in the 80s-90s. Of note, she has lost over 30 pounds in the past 3 months due to a new diabetes medication regimen. She denies exertional symptoms concerning for angina.  She denies palpitations, syncope, edema, PND, orthopnea.  Other than her low BP, and lightheadedness chest discomfort, she denies any additional concerns today. ? ?Home Medications  ?  ?Current Outpatient Medications  ?Medication Sig Dispense Refill  ? albuterol (PROVENTIL HFA) 108 (90 Base) MCG/ACT inhaler Inhale 2 puffs into the lungs every 6 (six) hours as needed for wheezing or shortness of breath. For shortness of breath and wheezing 18 each 2  ? Ascorbic Acid (VITAMIN C PO) Take 1 tablet by mouth daily.    ? aspirin EC 81 MG tablet Take 1 tablet (81 mg total) by mouth 2 (two) times daily after a meal. Day after surgery 60 tablet 1  ? BIOTIN PO Take 500 mg by mouth daily.    ? Cyanocobalamin (B-12 PO) Take 1,000 mcg by mouth daily.    ? Dexlansoprazole 30 MG capsule DR Take 30 mg by mouth daily.    ? diazepam (VALIUM) 5 MG tablet Take 5 mg by mouth daily as needed for muscle spasms (cramps).    ? ferrous sulfate 325 (65 FE) MG tablet Take 325 mg by mouth daily with breakfast.    ? fluticasone (FLONASE) 50 MCG/ACT nasal spray Place 2 sprays into both nostrils daily. (Patient taking differently: Place 2 sprays into both nostrils daily as needed for allergies.) 18.2 mL 11  ? fluticasone-salmeterol (ADVAIR HFA) 230-21 MCG/ACT inhaler Inhale 2 puffs into the lungs 2 (two) times daily. 8 g 6  ? furosemide (LASIX) 40 MG tablet Take 1 tablet (40 mg total) by mouth daily. In the morning (Patient taking differently: Take 40-60 mg by mouth See admin instructions. Take 60 mg in the morning and 20 mg bedtime) 90 tablet 3  ? hydrocortisone  2.5 % cream Apply topically 2 (two) times daily. Apply as needed two times daily 30 g 0  ? ipratropium-albuterol (DUONEB) 0.5-2.5 (3) MG/3ML SOLN Take 3 mLs by nebulization every 4 (four) hours as needed. 360 mL 11  ? JARDIANCE 10 MG TABS tablet Take 10 mg by mouth daily.    ? Lancets (ONETOUCH DELICA PLUS [id number]) MISC Apply 1 each topically 4 (four) times daily.    ? levothyroxine (SYNTHROID) 150 MCG tablet Take 150 mcg by mouth daily before breakfast.    ? Mepolizumab (NUCALA) 100 MG/ML SOAJ Inject 1 mL (100 mg total) into the skin every 28 (twenty-eight) days. 1 mL 5  ? methocarbamol (ROBAXIN) 500 MG tablet Take 1 tablet (500 mg total) by mouth every 8 (eight) hours as needed for muscle spasms. 40 tablet 1  ? montelukast (  SINGULAIR) 10 MG tablet Take 1 tablet (10 mg total) by mouth at bedtime. 30 tablet 11  ? Multiple Vitamin (MULTIVITAMIN WITH MINERALS) TABS tablet Take 1 tablet by mouth daily.    ? nitroGLYCERIN (NITROLINGUAL) 0.4 MG/SPRAY spray Place 1 spray under the tongue every 5 (five) minutes x 3 doses as needed for chest pain. 12 g 3  ? NOVOFINE PLUS PEN NEEDLE 32G X 4 MM MISC Inject into the skin.    ? ONE TOUCH ULTRA TEST test strip Use as directed    ? polyethylene glycol (MIRALAX / GLYCOLAX) 17 g packet Take 17 g by mouth daily. 14 each 0  ? potassium chloride SA (KLOR-CON) 20 MEQ tablet Take 20 mEq by mouth every Monday, Wednesday, and Friday.    ? pregabalin (LYRICA) 100 MG capsule Take 100 mg by mouth 2 (two) times daily.    ? Pyridoxine HCl (B-6 PO) Take 1 tablet by mouth daily.    ? ranolazine (RANEXA) 500 MG 12 hr tablet Take 1 tablet (500 mg total) by mouth 2 (two) times daily. 180 tablet 3  ? Respiratory Therapy Supplies (FLUTTER) DEVI 1 puff by Does not apply route daily. 1 each 0  ? rosuvastatin (CRESTOR) 20 MG tablet Take 20 mg by mouth daily.    ? tirzepatide (MOUNJARO) 5 MG/0.5ML Pen Inject 5 mg into the skin once a week.    ? traMADol (ULTRAM) 50 MG tablet Take 50 mg by mouth 3  (three) times daily as needed for pain.    ? triamterene-hydrochlorothiazide (MAXZIDE-25) 37.5-25 MG tablet TAKE ONE TABLET BY MOUTH DAILY 90 tablet 1  ? verapamil (CALAN-SR) 120 MG CR tablet Take 120 mg by mo

## 2021-11-20 LAB — BASIC METABOLIC PANEL
BUN/Creatinine Ratio: 24 (ref 12–28)
BUN: 50 mg/dL — ABNORMAL HIGH (ref 8–27)
CO2: 24 mmol/L (ref 20–29)
Calcium: 9.4 mg/dL (ref 8.7–10.3)
Chloride: 96 mmol/L (ref 96–106)
Creatinine, Ser: 2.06 mg/dL — ABNORMAL HIGH (ref 0.57–1.00)
Glucose: 123 mg/dL — ABNORMAL HIGH (ref 70–99)
Potassium: 3.3 mmol/L — ABNORMAL LOW (ref 3.5–5.2)
Sodium: 141 mmol/L (ref 134–144)
eGFR: 26 mL/min/{1.73_m2} — ABNORMAL LOW (ref >59)

## 2021-11-23 ENCOUNTER — Ambulatory Visit: Payer: Medicare Other

## 2021-11-23 DIAGNOSIS — M25561 Pain in right knee: Secondary | ICD-10-CM

## 2021-11-23 DIAGNOSIS — M6281 Muscle weakness (generalized): Secondary | ICD-10-CM

## 2021-11-23 DIAGNOSIS — M25661 Stiffness of right knee, not elsewhere classified: Secondary | ICD-10-CM | POA: Diagnosis not present

## 2021-11-23 DIAGNOSIS — R600 Localized edema: Secondary | ICD-10-CM

## 2021-11-23 DIAGNOSIS — R262 Difficulty in walking, not elsewhere classified: Secondary | ICD-10-CM

## 2021-11-23 NOTE — Therapy (Signed)
[place] [facility] ?[address] ?[place], [name], [address] ?Phone: [phone]   Fax:  [phone] ? ?Physical Therapy Treatment ? ?Patient Details  ?[name] ?MRN: [id number] ?Date of Birth: 04/17/1956 ?Referring Provider (PT): Lacie Draft PA-C/Jeffrey Beane MD ? ? ?Encounter Date: 11/23/2021 ? ? PT End of Session - 11/23/21 1405   ? ? Visit Number 5   ? Date for PT Re-Evaluation 12/28/21   ? Authorization Type [employer]HC MCR   ? Progress Note Due on Visit 10   ? PT Start Time 1316   ? PT Stop Time 1411   ? PT Time Calculation (min) 55 min   ? Activity Tolerance Patient tolerated treatment well   ? Behavior During Therapy [facility] for tasks assessed/performed   ? ?  ?  ? ?  ? ? ?Past Medical History:  ?Diagnosis Date  ? Acute bronchitis   ? Anemia   ? hx of anemia  ? Anginal pain ([place])   ? Arthritis   ? osteo  ? Asthma   ? Chronic kidney disease (CKD), stage IV (severe) (HCC)   ? Coronary atherosclerosis   ? Cough   ? GERD (gastroesophageal reflux disease)   ? History of colon polyps   ? History of migraine   ? Hyperlipidemia   ? Hypertension   ? Obstructive sleep apnea (adult) (pediatric)   ? NO CPAP   ? Peripheral neuropathy   ? Type II or unspecified type diabetes mellitus without mention of complication, not stated as uncontrolled   ? type 2  ? Unspecified hypothyroidism   ? ? ?Past Surgical History:  ?Procedure Laterality Date  ? ANTERIOR FUSION CERVICAL SPINE  2009  ? CARPAL TUNNEL RELEASE Left   ? COLONOSCOPY    ? CORONARY STENT INTERVENTION  2007  ? LEFT HEART CATHETERIZATION WITH CORONARY ANGIOGRAM N/A 05/29/2014  ? Procedure: LEFT HEART CATHETERIZATION WITH CORONARY ANGIOGRAM;  Surgeon: Sinclair Grooms, MD;  Location: [facility] CATH LAB;  Service: Cardiovascular;  Laterality: N/A;  ? 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  ? Procedure: TOTAL KNEE ARTHROPLASTY;  Surgeon: Susa Day, MD;  Location: [facility];  Service: Orthopedics;  Laterality:  Left;  3 hrs  ? TOTAL KNEE ARTHROPLASTY Right 10/01/2021  ? Procedure: TOTAL KNEE ARTHROPLASTY;  Surgeon: Susa Day, MD;  Location: [facility];  Service: Orthopedics;  Laterality: Right;  ? TUBAL LIGATION  1990  ? WISDOM TOOTH EXTRACTION    ? ? ?There were no vitals filed for this visit. ? ? Subjective Assessment - 11/23/21 1324   ? ? Subjective Pt notes that she is coming along well.   ? Pertinent History Left TKA, ACDF, DM, COPD, OA, HTN   ? Patient Stated Goals Get the right knee like the left   ? Currently in Pain? No/denies   ? ?  ?  ? ?  ? ? ? ? ? [facility] PT Assessment - 11/23/21 0001   ? ?  ? AROM  ? Overall AROM Comments supine   ? Right Knee Extension 3   ? Right Knee Flexion 121   ? ?  ?  ? ?  ? ? ? ? ? ? ? ? ? ? ? ? ? ? ? ? [facility] Adult PT Treatment/Exercise - 11/23/21 0001   ? ?  ? Ambulation/Gait  ? Stairs Yes   ? Stairs Assistance 7: Independent   ? Stair Management Technique One rail Left;One rail Right   ?  Number of Stairs 52   ? Height of Stairs 8   ? Gait Comments gait for 6 min for warm up   ?  ? Knee/Hip Exercises: Machines for Strengthening  ? Cybex Knee Extension 15# x 10 BLE, 5# x 10 RLE   ? Cybex Knee Flexion 5# x 10 RLE, 20# x 10 BLE   ?  ? Knee/Hip Exercises: Standing  ? Hip Flexion Stengthening;Left;10 reps;Knee straight;2 sets   2#, L LE to focus on R LE WB  ? Hip Abduction Stengthening;Left;10 reps;Knee straight;2 sets   2#, L LE to focus on R LE WB  ? Hip Extension Stengthening;Left;10 reps;Knee straight;2 sets   2#, L LE to focus on R LE WB  ? Lateral Step Up Right;20 reps;Hand Hold: 1;Step Height: 6"   ? Forward Step Up Right;20 reps;Hand Hold: 0;Step Height: 6"   ? Forward Step Up Limitations L LE tap to step   ?  ? Knee/Hip Exercises: Seated  ? Sit to Sand 2 sets;10 reps;without UE support   ?  ? Modalities  ? Modalities Vasopneumatic   ?  ? Vasopneumatic  ? Number Minutes Vasopneumatic  10 minutes   ? Vasopnuematic Location  Knee   ? Vasopneumatic Pressure Medium   ? Vasopneumatic  Temperature  34   ? ?  ?  ? ?  ? ? ? ? ? ? ? ? ? ? ? ? PT Short Term Goals - 11/19/21 1140   ? ?  ? PT SHORT TERM GOAL #1  ? Title Patient to be independent with initial HEP.   ? Time 3   ? Period Weeks   ? Status Achieved   11/19/21  ? Target Date 11/23/21   ? ?  ?  ? ?  ? ? ? ? PT Long Term Goals - 11/23/21 1403   ? ?  ? PT LONG TERM GOAL #1  ? Title Patient to be independent with advanced HEP.   ? Time 8   ? Period Weeks   ? Status On-going   ? Target Date 12/28/21   ?  ? PT LONG TERM GOAL #2  ? Title Improved right knee ROM 0 - 120 deg or better to normalize gait and ADLS   ? Time 8   ? Period Weeks   ? Status Partially Met   11/23/21- met for flexion (3-121)  ?  ? PT LONG TERM GOAL #3  ? Title Patient able to climb stairs with 5/5 right knee stength and a reciprocal gait pattern.   ? Time 8   ? Period Weeks   ? Status On-going   ? Target Date 12/28/21   ?  ? PT LONG TERM GOAL #4  ? Title Patient able to amb community distances safely with a normal gait pattern and no AD   ? Time 8   ? Period Weeks   ? Status On-going   ? Target Date 12/28/21   ?  ? PT LONG TERM GOAL #5  ? Title Patient able to perform ADLS with >= 94% reduction in pain.   ? Time 8   ? Period Weeks   ? Status On-going   ? Target Date 12/28/21   ?  ? PT LONG TERM GOAL #6  ? Title improved FOTO to 79 from 31 showing functional improvement   ? Time 8   ? Period Weeks   ? Status On-going   ? Target Date 12/28/21   ? ?  ?  ? ?  ? ? ? ? ? ? ? ?  Plan - 11/23/21 1405   ? ? Clinical Impression Statement Pt demonstrates improved R knee flexion today, now partially met LTG #2. Extension is still lacking 3 deg in supine supported and 1 deg in supported sitting. Noticed increased L WS with STS, so focused on R step ups and L hip strengthening to improve weight acceptance on the R LE. Pt showed a good capacity for exercises today, only noting fatigue towards end of session. Ended session with GR to knee to reduce swelling post exercise.   ? Personal Factors and  Comorbidities Comorbidity 3+   ? Comorbidities DM, COPD, HTN   ? PT Frequency 2x / week   ? PT Duration 8 weeks   ? PT Treatment/Interventions ADLs/Self Care Home Management;Cryotherapy;Electrical Stimulation;Moist Heat;Gait training;Stair training;Functional mobility training;Therapeutic activities;Therapeutic exercise;Balance training;Neuromuscular re-education;Manual techniques;Patient/family education;Passive range of motion;Dry needling;Taping;Vasopneumatic Device   ? PT Next Visit Plan eccentric quad strength, try gait with obstacles   ? PT Home Exercise Plan [id number]   ? Consulted and Agree with Plan of Care Patient   ? ?  ?  ? ?  ? ? ?Patient will benefit from skilled therapeutic intervention in order to improve the following deficits and impairments:  Decreased range of motion, Abnormal gait, Increased muscle spasms, Pain, Impaired flexibility, Decreased strength, Increased edema ? ?Visit Diagnosis: ?Stiffness of right knee, not elsewhere classified ? ?Acute pain of right knee ? ?Localized edema ? ?Muscle weakness (generalized) ? ?Difficulty in walking, not elsewhere classified ? ? ? ? ?Problem List ?Patient Active Problem List  ? Diagnosis Date Noted  ? 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 Severe persistent asthma 10/05/2020  ? 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 Allergic reaction 04/26/2020  ? Acute renal failure superimposed on stage 3 chronic kidney disease ([place]) 04/26/2020  ? Stomatitis 04/26/2020  ? Allergic reaction caused by a drug 04/26/2020  ? Rash   ? Left knee DJD 02/05/2020  ? Acute bronchitis 08/10/2014  ? Unstable angina ([place]) 05/29/2014  ? Abnormal nuclear stress test 05/29/2014  ? Chest pain 05/06/2014  ? Exertional shortness of breath 05/06/2014  ? Mixed hyperlipidemia 05/06/2014  ? Hyperlipidemia with target LDL less than 70 08/05/2013  ? HTN (hypertension) 08/05/2013  ? Chest wall pain 08/05/2013  ? SINUSITIS, ACUTE 04/07/2009  ? HYPOTHYROIDISM 12/10/2007  ? Type 2 diabetes  mellitus with hemoglobin A1c goal of less than 7.0% ([place]) 05/28/2007  ? Obstructive sleep apnea 05/28/2007  ? Coronary atherosclerosis 05/28/2007  ? Seasonal and perennial allergic rhinitis 05/28/2007  ? COUGH

## 2021-11-24 ENCOUNTER — Telehealth: Payer: Self-pay

## 2021-11-24 MED ORDER — POTASSIUM CHLORIDE CRYS ER 10 MEQ PO TBCR: 10.0000 meq | EXTENDED_RELEASE_TABLET | Freq: Every day | ORAL | 2 refills | Status: DC

## 2021-11-24 NOTE — Telephone Encounter (Signed)
Spoke with pt. Pt was notified of lab results. Potassium 10 meq sent into pts pharmacy. Pt will continue her current medications and follow up as planned.  ?

## 2021-11-26 ENCOUNTER — Encounter: Payer: Self-pay | Admitting: Physical Therapy

## 2021-11-26 ENCOUNTER — Ambulatory Visit: Payer: Medicare Other | Admitting: Physical Therapy

## 2021-11-26 DIAGNOSIS — R600 Localized edema: Secondary | ICD-10-CM

## 2021-11-26 DIAGNOSIS — M25661 Stiffness of right knee, not elsewhere classified: Secondary | ICD-10-CM | POA: Diagnosis not present

## 2021-11-26 DIAGNOSIS — M6281 Muscle weakness (generalized): Secondary | ICD-10-CM

## 2021-11-26 DIAGNOSIS — R262 Difficulty in walking, not elsewhere classified: Secondary | ICD-10-CM

## 2021-11-26 DIAGNOSIS — M25561 Pain in right knee: Secondary | ICD-10-CM

## 2021-11-26 NOTE — Therapy (Signed)
[place] [facility] ?[place] ?[place], [name], [address] ?Phone: [phone]   Fax:  [phone] ? ?Physical Therapy Treatment ? ?Patient Details  ?[name] ?MRN: [id number] ?Date of Birth: 30-Jun-1956 ?Referring Provider (PT): Lacie Draft PA-C/Jeffrey Beane MD ? ? ?Encounter Date: 11/26/2021 ? ? PT End of Session - 11/26/21 1100   ? ? Visit Number 6   ? Date for PT Re-Evaluation 12/28/21   ? Authorization Type UHC MCR   ? Progress Note Due on Visit 10   ? PT Start Time 1100   ? PT Stop Time 1148   ? PT Time Calculation (min) 48 min   ? Activity Tolerance Patient tolerated treatment well   ? Behavior During Therapy [facility] for tasks assessed/performed   ? ?  ?  ? ?  ? ? ?Past Medical History:  ?Diagnosis Date  ? Acute bronchitis   ? Anemia   ? hx of anemia  ? Anginal pain ([place])   ? Arthritis   ? osteo  ? Asthma   ? Chronic kidney disease (CKD), stage IV (severe) (HCC)   ? Coronary atherosclerosis   ? Cough   ? GERD (gastroesophageal reflux disease)   ? History of colon polyps   ? History of migraine   ? Hyperlipidemia   ? Hypertension   ? Obstructive sleep apnea (adult) (pediatric)   ? NO CPAP   ? Peripheral neuropathy   ? Type II or unspecified type diabetes mellitus without mention of complication, not stated as uncontrolled   ? type 2  ? Unspecified hypothyroidism   ? ? ?Past Surgical History:  ?Procedure Laterality Date  ? ANTERIOR FUSION CERVICAL SPINE  2009  ? CARPAL TUNNEL RELEASE Left   ? COLONOSCOPY    ? CORONARY STENT INTERVENTION  2007  ? LEFT HEART CATHETERIZATION WITH CORONARY ANGIOGRAM N/A 05/29/2014  ? Procedure: LEFT HEART CATHETERIZATION WITH CORONARY ANGIOGRAM;  Surgeon: Sinclair Grooms, MD;  Location: [facility] CATH LAB;  Service: Cardiovascular;  Laterality: N/A;  ? 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  ? Procedure: TOTAL KNEE ARTHROPLASTY;  Surgeon: Susa Day, MD;  Location: [facility] ORS;  Service: Orthopedics;  Laterality:  Left;  3 hrs  ? TOTAL KNEE ARTHROPLASTY Right 10/01/2021  ? Procedure: TOTAL KNEE ARTHROPLASTY;  Surgeon: Susa Day, MD;  Location: [facility] ORS;  Service: Orthopedics;  Laterality: Right;  ? TUBAL LIGATION  1990  ? WISDOM TOOTH EXTRACTION    ? ? ?There were no vitals filed for this visit. ? ? Subjective Assessment - 11/26/21 1103   ? ? Subjective Pt reports she thinks she was on her knee to much yesterday - R knee fells tight and swollen but denies pain.   ? Pertinent History Left TKA, ACDF, DM, COPD, OA, HTN   ? Patient Stated Goals Get the right knee like the left   ? Currently in Pain? No/denies   ? ?  ?  ? ?  ? ? ? ? ? [facility] PT Assessment - 11/26/21 1100   ? ?  ? Assessment  ? Medical Diagnosis Aftercare following joint replacement surgery - R TKA   ? Referring Provider (PT) Lacie Draft PA-C/Jeffrey Beane MD   ? Onset Date/Surgical Date 10/01/21   ? Next MD Visit 12/02/21 with Latanya Maudlin, MD for the wound on her distal surgical incision; unsure of date of next appt with Dr. Tonita Cong   ?  ? Strength  ? Right Hip  Flexion 4/5   ? Right Hip Extension 4-/5   ? Right Hip External Rotation  4/5   painful catch in R hip  ? Right Hip Internal Rotation 4+/5   ? Right Hip ABduction 4-/5   ? Right Hip ADduction 4-/5   ? Left Hip Flexion 4/5   ? Left Hip Extension 4/5   ? Left Hip External Rotation 4/5   ? Left Hip Internal Rotation 4+/5   ? Left Hip ABduction 4/5   ? Left Hip ADduction 4/5   ? Right Knee Flexion 4+/5   ? Right Knee Extension 4+/5   ? Left Knee Flexion 5/5   ? Left Knee Extension 5/5   ? Right Ankle Dorsiflexion 4+/5   ? Right Ankle Plantar Flexion 4+/5   14 SLS heel raises  ? Left Ankle Dorsiflexion 4+/5   ? Left Ankle Plantar Flexion 4+/5   14 SLS heel raises  ? ?  ?  ? ?  ? ? ? ? ? ? ? ? ? ? ? ? ? ? ? ? [place] Adult PT Treatment/Exercise - 11/26/21 1100   ? ?  ? Ambulation/Gait  ? Stairs Assistance 6: Modified independent (Device/Increase time)   ? Stair Management Technique One rail Left;One rail  Right;Alternating pattern;Forwards   ? Number of Stairs 14   ? Height of Stairs 7   ?  ? Knee/Hip Exercises: Aerobic  ? Recumbent Bike L3 x 6 min   ?  ? Knee/Hip Exercises: Standing  ? Hip Flexion Right;Left;10 reps;Stengthening;Knee straight   ? Hip Flexion Limitations red TB on single ankle - anchored to mat table; UE support on back of chair   ? Hip ADduction Right;Left;10 reps;Strengthening   ? Hip ADduction Limitations red TB on single ankle - anchored to mat table; UE support on back of chair   ? Hip Abduction Right;Left;10 reps;Stengthening;Knee straight   ? Abduction Limitations red TB on single ankle - anchored to mat table; UE support on back of chair   ? Hip Extension Right;Left;10 reps;Stengthening;Knee straight   ? Extension Limitations red TB on single ankle - anchored to mat table; UE support on back of chair   ?  ? Knee/Hip Exercises: Sidelying  ? Clams R/L red TB clam 10 x 3"   ? ?  ?  ? ?  ? ? ? ? ? ? ? ? ? ? PT Education - 11/26/21 1145   ? ? Education Details HEP progression   ? Person(s) Educated Patient   ? Methods Explanation;Demonstration;Verbal cues;Handout   ? Comprehension Verbalized understanding;Verbal cues required;Returned demonstration;Need further instruction   ? ?  ?  ? ?  ? ? ? PT Short Term Goals - 11/19/21 1140   ? ?  ? PT SHORT TERM GOAL #1  ? Title Patient to be independent with initial HEP.   ? Time 3   ? Period Weeks   ? Status Achieved   11/19/21  ? Target Date 11/23/21   ? ?  ?  ? ?  ? ? ? ? PT Long Term Goals - 11/26/21 1105   ? ?  ? PT LONG TERM GOAL #1  ? Title Patient to be independent with advanced HEP.   ? Status Partially Met   ? Target Date 12/28/21   ?  ? PT LONG TERM GOAL #2  ? Title Improved right knee ROM 0 - 120 deg or better to normalize gait and ADLS   ? Status Partially Met  11/23/21 - met for flexion (3-121?)  ? Target Date 12/28/21   ?  ? PT LONG TERM GOAL #3  ? Title Patient able to climb stairs with 5/5 right knee stength and a reciprocal gait pattern.    ? Status Partially Met   11/26/21 - able to climb stairs reciprocally with good pattern  ? Target Date 12/28/21   ?  ? PT LONG TERM GOAL #4  ? Title Patient able to amb community distances safely with a normal gait pattern and no AD   ? Status Achieved   11/26/21  ?  ? PT LONG TERM GOAL #5  ? Title Patient able to perform ADLS with >= 85% reduction in pain.   ? Status Achieved   11/26/21 - pt denies any knee pain lately  ?  ? PT LONG TERM GOAL #6  ? Title improved FOTO to 79 from 43 showing functional improvement   ? Status On-going   ? Target Date 12/28/21   ? ?  ?  ? ?  ? ? ? ? ? ? ? ? Plan - 11/26/21 1108   ? ? Clinical Impression Statement Asa Lente denies any recent pain in her R knee but does note some increased tightness and edema after being on her feet a lot yesterday. She has fully weaned from the [facility] and is able to walk and navigate stairs reciprocally with good gait/step pattern - LTGs #4 and 5 now met. Strength testing reveals continued LE weakness R>L with greatest weakness at hips, therefore TE today targeting hip strengthening with HEP updated accordingly. Asa Lente mentions some continued issues with her R shoulder and reports she is still working on trying to get a referral for PT to address her shoulder.   ? Comorbidities DM, COPD, HTN   ? Rehab Potential Excellent   ? PT Frequency 2x / week   ? PT Duration 8 weeks   ? PT Treatment/Interventions ADLs/Self Care Home Management;Cryotherapy;Electrical Stimulation;Moist Heat;Gait training;Stair training;Functional mobility training;Therapeutic activities;Therapeutic exercise;Balance training;Neuromuscular re-education;Manual techniques;Patient/family education;Passive range of motion;Dry needling;Taping;Vasopneumatic Device   ? PT Next Visit Plan proximal LE strengthening, eccentric quad strength, try gait with obstacles   ? PT Home Exercise Plan [email or web address]   ? Consulted and Agree with Plan of Care Patient   ? ?  ?  ? ?  ? ? ?Patient will benefit from skilled  therapeutic intervention in order to improve the following deficits and impairments:  Decreased range of motion, Abnormal gait, Increased muscle spasms, Pain, Impaired flexibility, Decreased strength, Increased ed

## 2021-11-26 NOTE — Patient Instructions (Signed)
? ? ?  Access Code: [email or web address] ?URL: [email or web address] ?Date: 11/26/2021 ?Prepared by: Annie Paras ? ?Exercises ?- Seated Knee Flexion Stretch  - 3 x daily - 7 x weekly - 1 sets - 3 reps - 30-60 sec hold ?- Supine Knee Extension Strengthening  - 1 x daily - 7 x weekly - 2 sets - 10 reps ?- Supine Heel Slide with Strap (Mirrored)  - 2 x daily - 7 x weekly - 1-2 sets - 10 reps ?- Supine Hamstring Stretch with Strap  - 2 x daily - 7 x weekly - 1 sets - 3 reps - 60 sec hold ?- Squat with Counter Support  - 1 x daily - 7 x weekly - 3 sets - 10 reps ?- Seated Knee Extension with Resistance  - 1 x daily - 7 x weekly - 3 sets - 10 reps ?- Seated Knee Flexion with Anchored Resistance  - 1 x daily - 7 x weekly - 3 sets - 10 reps ?- Standing Hip Flexion with Anchored Resistance and Chair Support  - 1 x daily - 3-4 x weekly - 2 sets - 10 reps - 3 sec hold ?- Standing Hip Adduction with Anchored Resistance  - 1 x daily - 3-4 x weekly - 2 sets - 10 reps - 3 sec hold ?- Standing Hip Extension with Anchored Resistance  - 1 x daily - 3-4 x weekly - 2 sets - 10 reps - 3 sec hold ?- Standing Hip Abduction with Anchored Resistance  - 1 x daily - 3-4 x weekly - 2 sets - 10 reps - 3 sec hold ?- Clamshell with Resistance  - 1 x daily - 3-4 x weekly - 2 sets - 10 reps - 3-5 sec hold ?

## 2021-11-30 ENCOUNTER — Ambulatory Visit: Payer: Medicare Other

## 2021-11-30 DIAGNOSIS — R262 Difficulty in walking, not elsewhere classified: Secondary | ICD-10-CM

## 2021-11-30 DIAGNOSIS — M25661 Stiffness of right knee, not elsewhere classified: Secondary | ICD-10-CM

## 2021-11-30 DIAGNOSIS — M25561 Pain in right knee: Secondary | ICD-10-CM

## 2021-11-30 DIAGNOSIS — R600 Localized edema: Secondary | ICD-10-CM

## 2021-11-30 DIAGNOSIS — M6281 Muscle weakness (generalized): Secondary | ICD-10-CM

## 2021-11-30 NOTE — Therapy (Signed)
[place] ?[facility] ?[place] ?[place], [name], [address] ?Phone: [phone]   Fax:  [phone] ? ?Physical Therapy Treatment ? ?Patient Details  ?[name] ?MRN: [id number] ?Date of Birth: Feb 13, 1956 ?Referring Provider (PT): Lacie Draft PA-C/Jeffrey Beane MD ? ? ?Encounter Date: 11/30/2021 ? ? PT End of Session - 11/30/21 1358   ? ? Visit Number 7   ? Date for PT Re-Evaluation 12/28/21   ? Authorization Type UHC MCR   ? Progress Note Due on Visit 10   ? PT Start Time 1316   ? PT Stop Time 1975   ? PT Time Calculation (min) 41 min   ? Activity Tolerance Patient tolerated treatment well   ? Behavior During Therapy [name] for tasks assessed/performed   ? ?  ?  ? ?  ? ? ?Past Medical History:  ?Diagnosis Date  ? Acute bronchitis   ? Anemia   ? hx of anemia  ? Anginal pain ([place])   ? Arthritis   ? osteo  ? Asthma   ? Chronic kidney disease (CKD), stage IV (severe) (HCC)   ? Coronary atherosclerosis   ? Cough   ? GERD (gastroesophageal reflux disease)   ? History of colon polyps   ? History of migraine   ? Hyperlipidemia   ? Hypertension   ? Obstructive sleep apnea (adult) (pediatric)   ? NO CPAP   ? Peripheral neuropathy   ? Type II or unspecified type diabetes mellitus without mention of complication, not stated as uncontrolled   ? type 2  ? Unspecified hypothyroidism   ? ? ?Past Surgical History:  ?Procedure Laterality Date  ? ANTERIOR FUSION CERVICAL SPINE  2009  ? CARPAL TUNNEL RELEASE Left   ? COLONOSCOPY    ? CORONARY STENT INTERVENTION  2007  ? LEFT HEART CATHETERIZATION WITH CORONARY ANGIOGRAM N/A 05/29/2014  ? Procedure: LEFT HEART CATHETERIZATION WITH CORONARY ANGIOGRAM;  Surgeon: Sinclair Grooms, MD;  Location: [facility] CATH LAB;  Service: Cardiovascular;  Laterality: N/A;  ? 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  ? Procedure: TOTAL KNEE ARTHROPLASTY;  Surgeon: Susa Day, MD;  Location: [facility];  Service: Orthopedics;  Laterality:  Left;  3 hrs  ? TOTAL KNEE ARTHROPLASTY Right 10/01/2021  ? Procedure: TOTAL KNEE ARTHROPLASTY;  Surgeon: Susa Day, MD;  Location: [facility];  Service: Orthopedics;  Laterality: Right;  ? TUBAL LIGATION  1990  ? WISDOM TOOTH EXTRACTION    ? ? ?There were no vitals filed for this visit. ? ? Subjective Assessment - 11/30/21 1321   ? ? Subjective Pt reports more swelling in R knee today and F/U with wound doctor tomorrow.   ? Pertinent History Left TKA, ACDF, DM, COPD, OA, HTN   ? Patient Stated Goals Get the right knee like the left   ? Currently in Pain? No/denies   ? ?  ?  ? ?  ? ? ? ? ? ? ? ? ? ? ? ? ? ? ? ? ? ? ? ? [place] Adult PT Treatment/Exercise - 11/30/21 0001   ? ?  ? Knee/Hip Exercises: Stretches  ? Hip Flexor Stretch Right;3 reps;10 seconds   ? Hip Flexor Stretch Limitations mod thomas with therapist mobilization into knee flexion  ?  ? Knee/Hip Exercises: Aerobic  ? Recumbent Bike L3 x 5 min   ?  ? Knee/Hip Exercises: Machines for Strengthening  ? Cybex Knee Extension 15# x 20 BLE, 5#  x 15 RLE   ? Cybex Knee Flexion 10# x 20 RLE, 20# x 20 BLE   ?  ? Knee/Hip Exercises: Standing  ? Forward Step Up 5 reps;Hand Hold: 0;Step Height: 8"   ? Forward Step Up Limitations up and over fwd   ? Other Standing Knee Exercises B hip flex + abd + ext (half circle) x 10 with UE support   ?  ? Manual Therapy  ? Manual Therapy Soft tissue mobilization   ? Soft tissue mobilization IASTM with rolling stick to R VL, RF, ITB   ? ?  ?  ? ?  ? ? ? ? ? ? ? ? ? ? ? ? PT Short Term Goals - 11/19/21 1140   ? ?  ? PT SHORT TERM GOAL #1  ? Title Patient to be independent with initial HEP.   ? Time 3   ? Period Weeks   ? Status Achieved   11/19/21  ? Target Date 11/23/21   ? ?  ?  ? ?  ? ? ? ? PT Long Term Goals - 11/26/21 1105   ? ?  ? PT LONG TERM GOAL #1  ? Title Patient to be independent with advanced HEP.   ? Status Partially Met   ? Target Date 12/28/21   ?  ? PT LONG TERM GOAL #2  ? Title Improved right knee ROM 0 - 120 deg or  better to normalize gait and ADLS   ? Status Partially Met   11/23/21 - met for flexion (3-121?)  ? Target Date 12/28/21   ?  ? PT LONG TERM GOAL #3  ? Title Patient able to climb stairs with 5/5 right knee stength and a reciprocal gait pattern.   ? Status Partially Met   11/26/21 - able to climb stairs reciprocally with good pattern  ? Target Date 12/28/21   ?  ? PT LONG TERM GOAL #4  ? Title Patient able to amb community distances safely with a normal gait pattern and no AD   ? Status Achieved   11/26/21  ?  ? PT LONG TERM GOAL #5  ? Title Patient able to perform ADLS with >= 74% reduction in pain.   ? Status Achieved   11/26/21 - pt denies any knee pain lately  ?  ? PT LONG TERM GOAL #6  ? Title improved FOTO to 79 from 43 showing functional improvement   ? Status On-going   ? Target Date 12/28/21   ? ?  ?  ? ?  ? ? ? ? ? ? ? ? Plan - 11/30/21 1400   ? ? Clinical Impression Statement Pt noted increased swelling in the R knee today, which I assured her that it most likely is from the progressed exercises last visit. Pt demonstrated some fatigue from the standing hip strengthening, with R LE support. Also noted R knee fatigue with leg extensions. Postural cues required with standing TE, otherwise pt showed good demonstration of exercises. Declined modalities post session.   ? Personal Factors and Comorbidities Comorbidity 3+   ? Comorbidities DM, COPD, HTN   ? PT Frequency 2x / week   ? PT Duration 8 weeks   ? PT Treatment/Interventions ADLs/Self Care Home Management;Cryotherapy;Electrical Stimulation;Moist Heat;Gait training;Stair training;Functional mobility training;Therapeutic activities;Therapeutic exercise;Balance training;Neuromuscular re-education;Manual techniques;Patient/family education;Passive range of motion;Dry needling;Taping;Vasopneumatic Device   ? PT Next Visit Plan proximal LE strengthening, eccentric quad strength, try gait with obstacles   ? PT Home Exercise  Plan [email or web address]   ? Consulted and Agree  with Plan of Care Patient   ? ?  ?  ? ?  ? ? ?Patient will benefit from skilled therapeutic intervention in order to improve the following deficits and impairments:  Decreased range of motion, Abnormal gait, Increased muscle spasms, Pain, Impaired flexibility, Decreased strength, Increased edema ? ?Visit Diagnosis: ?Stiffness of right knee, not elsewhere classified ? ?Acute pain of right knee ? ?Localized edema ? ?Muscle weakness (generalized) ? ?Difficulty in walking, not elsewhere classified ? ? ? ? ?Problem List ?Patient Active Problem List  ? Diagnosis Date Noted  ? 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 Severe persistent asthma 10/05/2020  ? 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 Allergic reaction 04/26/2020  ? Acute renal failure superimposed on stage 3 chronic kidney disease ([place]) 04/26/2020  ? Stomatitis 04/26/2020  ? Allergic reaction caused by a drug 04/26/2020  ? Rash   ? Left knee DJD 02/05/2020  ? Acute bronchitis 08/10/2014  ? Unstable angina ([place]) 05/29/2014  ? Abnormal nuclear stress test 05/29/2014  ? Chest pain 05/06/2014  ? Exertional shortness of breath 05/06/2014  ? Mixed hyperlipidemia 05/06/2014  ? Hyperlipidemia with target LDL less than 70 08/05/2013  ? HTN (hypertension) 08/05/2013  ? Chest wall pain 08/05/2013  ? SINUSITIS, ACUTE 04/07/2009  ? HYPOTHYROIDISM 12/10/2007  ? Type 2 diabetes mellitus with hemoglobin A1c goal of less than 7.0% ([place]) 05/28/2007  ? Obstructive sleep apnea 05/28/2007  ? Coronary atherosclerosis 05/28/2007  ? Seasonal and perennial allergic rhinitis 05/28/2007  ? COUGH, CHRONIC 05/28/2007  ? ? ?Artist Pais, PTA ?11/30/2021, 2:32 PM ? ?[place] ?[facility] ?[place] ?[place], [name], [address] ?Phone: [phone]   Fax:  [phone] ? ?[name] ?MRN: [id number] ?Date of Birth: Sep 13, 1955 ? ? ? ?

## 2021-12-01 ENCOUNTER — Ambulatory Visit: Payer: Medicare Other

## 2021-12-01 DIAGNOSIS — M6281 Muscle weakness (generalized): Secondary | ICD-10-CM

## 2021-12-01 DIAGNOSIS — R262 Difficulty in walking, not elsewhere classified: Secondary | ICD-10-CM

## 2021-12-01 DIAGNOSIS — M25661 Stiffness of right knee, not elsewhere classified: Secondary | ICD-10-CM

## 2021-12-01 DIAGNOSIS — R600 Localized edema: Secondary | ICD-10-CM

## 2021-12-01 DIAGNOSIS — M25561 Pain in right knee: Secondary | ICD-10-CM

## 2021-12-01 NOTE — Therapy (Signed)
[place] [facility] ?[address] ?[place], [name], [address] ?Phone: [phone]   Fax:  [phone] ? ?Physical Therapy Treatment ? ?Patient Details  ?[name] ?MRN: [id number] ?Date of Birth: [birth year] ?Referring Provider (PT): Lacie Draft PA-C/Jeffrey Beane MD ? ? ?Encounter Date: 12/01/2021 ? ? PT End of Session - 12/01/21 1151   ? ? Visit Number 8   ? Date for PT Re-Evaluation 12/28/21   ? Authorization Type UHC MCR   ? Progress Note Due on Visit 10   ? PT Start Time 1105   ? PT Stop Time 1145   ? PT Time Calculation (min) 40 min   ? Activity Tolerance Patient tolerated treatment well   ? Behavior During Therapy [facility] for tasks assessed/performed   ? ?  ?  ? ?  ? ? ?Past Medical History:  ?Diagnosis Date  ? Acute bronchitis   ? Anemia   ? hx of anemia  ? Anginal pain ([place])   ? Arthritis   ? osteo  ? Asthma   ? Chronic kidney disease (CKD), stage IV (severe) (HCC)   ? Coronary atherosclerosis   ? Cough   ? GERD (gastroesophageal reflux disease)   ? History of colon polyps   ? History of migraine   ? Hyperlipidemia   ? Hypertension   ? Obstructive sleep apnea (adult) (pediatric)   ? NO CPAP   ? Peripheral neuropathy   ? Type II or unspecified type diabetes mellitus without mention of complication, not stated as uncontrolled   ? type 2  ? Unspecified hypothyroidism   ? ? ?Past Surgical History:  ?Procedure Laterality Date  ? ANTERIOR FUSION CERVICAL SPINE  2009  ? CARPAL TUNNEL RELEASE Left   ? COLONOSCOPY    ? CORONARY STENT INTERVENTION  2007  ? LEFT HEART CATHETERIZATION WITH CORONARY ANGIOGRAM N/A 05/29/2014  ? Procedure: LEFT HEART CATHETERIZATION WITH CORONARY ANGIOGRAM;  Surgeon: Sinclair Grooms, MD;  Location: [facility] CATH LAB;  Service: Cardiovascular;  Laterality: N/A;  ? 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  ? Procedure: TOTAL KNEE ARTHROPLASTY;  Surgeon: Susa Day, MD;  Location: [facility] ORS;  Service: Orthopedics;  Laterality:  Left;  3 hrs  ? TOTAL KNEE ARTHROPLASTY Right 10/01/2021  ? Procedure: TOTAL KNEE ARTHROPLASTY;  Surgeon: Susa Day, MD;  Location: [facility] OR[facility];  Service: Orthopedics;  Laterality: Right;  ? TUBAL LIGATION  1990  ? WISDOM TOOTH EXTRACTION    ? ? ?There were no vitals filed for this visit. ? ? Subjective Assessment - 12/01/21 1115   ? ? Subjective Having some soreness from doing a lot yesterday.   ? Pertinent History Left TKA, ACDF, DM, COPD, OA, HTN   ? Patient Stated Goals Get the right knee like the left   ? Currently in Pain? No/denies   ? ?  ?  ? ?  ? ? ? ? ? ? ? ? ? ? ? ? ? ? ? ? ? ? ? ? [place] Adult PT Treatment/Exercise - 12/01/21 0001   ? ?  ? Ambulation/Gait  ? Ambulation Distance (Feet) 720 Feet   ? Number of Stairs 56   ? Height of Stairs 7   ? Gait Comments no AD, 6 min walk for warm up   ?  ? Knee/Hip Exercises: Aerobic  ? Recumbent Bike L3x61mn   ?  ? Knee/Hip Exercises: Machines for Strengthening  ? Cybex Knee Flexion 25# BLE x 20;  10 RLE x 10   ?  ? Knee/Hip Exercises: Standing  ? Terminal Knee Extension Strengthening;Right;20 reps;Theraband   ? Theraband Level (Terminal Knee Extension) Level 4 (Blue)   ? Other Standing Knee Exercises B side steps and retro gait with GTB x3 along counter   ? ?  ?  ? ?  ? ? ? ? ? ? ? ? ? ? ? ? PT Short Term Goals - 11/19/21 1140   ? ?  ? PT SHORT TERM GOAL #1  ? Title Patient to be independent with initial HEP.   ? Time 3   ? Period Weeks   ? Status Achieved   11/19/21  ? Target Date 11/23/21   ? ?  ?  ? ?  ? ? ? ? PT Long Term Goals - 11/26/21 1105   ? ?  ? PT LONG TERM GOAL #1  ? Title Patient to be independent with advanced HEP.   ? Status Partially Met   ? Target Date 12/28/21   ?  ? PT LONG TERM GOAL #2  ? Title Improved right knee ROM 0 - 120 deg or better to normalize gait and ADLS   ? Status Partially Met   11/23/21 - met for flexion (3-121?)  ? Target Date 12/28/21   ?  ? PT LONG TERM GOAL #3  ? Title Patient able to climb stairs with 5/5 right knee stength and a  reciprocal gait pattern.   ? Status Partially Met   11/26/21 - able to climb stairs reciprocally with good pattern  ? Target Date 12/28/21   ?  ? PT LONG TERM GOAL #4  ? Title Patient able to amb community distances safely with a normal gait pattern and no AD   ? Status Achieved   11/26/21  ?  ? PT LONG TERM GOAL #5  ? Title Patient able to perform ADLS with >= 25% reduction in pain.   ? Status Achieved   11/26/21 - pt denies any knee pain lately  ?  ? PT LONG TERM GOAL #6  ? Title improved FOTO to 79 from 31 showing functional improvement   ? Status On-going   ? Target Date 12/28/21   ? ?  ?  ? ?  ? ? ? ? ? ? ? ? Plan - 12/01/21 1152   ? ? Clinical Impression Statement Pt did report more soreness and swelling in the R knee this morning from 'running out in the streets' all day yesterday. Had no increase in pain with any interventions, cues needed with TKE to prevent leaning with trunk. Cues with the retro gait for symmetrical step length. Pt notes that her MD will fax over referral for shoulder and she is to see wound care doctor later on today.   ? Personal Factors and Comorbidities Comorbidity 3+   ? Comorbidities DM, COPD, HTN   ? PT Frequency 2x / week   ? PT Duration 8 weeks   ? PT Treatment/Interventions ADLs/Self Care Home Management;Cryotherapy;Electrical Stimulation;Moist Heat;Gait training;Stair training;Functional mobility training;Therapeutic activities;Therapeutic exercise;Balance training;Neuromuscular re-education;Manual techniques;Patient/family education;Passive range of motion;Dry needling;Taping;Vasopneumatic Device   ? PT Next Visit Plan proximal LE strengthening, eccentric quad strength, try gait with obstacles   ? PT Home Exercise Plan [id number]   ? Consulted and Agree with Plan of Care Patient   ? ?  ?  ? ?  ? ? ?Patient will benefit from skilled therapeutic intervention in order to improve the following deficits and impairments:  Decreased range of motion, Abnormal gait, Increased muscle spasms,  Pain, Impaired flexibility, Decreased strength, Increased edema ? ?Visit Diagnosis: ?Stiffness of right knee, not elsewhere classified ? ?Acute pain of right knee ? ?Localized edema ? ?Muscle weakness (generalized) ? ?Difficulty in walking, not elsewhere classified ? ? ? ? ?Problem List ?Patient Active Problem List  ? Diagnosis Date Noted  ? 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 Severe persistent asthma 10/05/2020  ? 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 Allergic reaction 04/26/2020  ? Acute renal failure superimposed on stage 3 chronic kidney disease ([place]) 04/26/2020  ? Stomatitis 04/26/2020  ? Allergic reaction caused by a drug 04/26/2020  ? Rash   ? Left knee DJD 02/05/2020  ? Acute bronchitis 08/10/2014  ? Unstable angina ([place]) 05/29/2014  ? Abnormal nuclear stress test 05/29/2014  ? Chest pain 05/06/2014  ? Exertional shortness of breath 05/06/2014  ? Mixed hyperlipidemia 05/06/2014  ? Hyperlipidemia with target LDL less than 70 08/05/2013  ? HTN (hypertension) 08/05/2013  ? Chest wall pain 08/05/2013  ? SINUSITIS, ACUTE 04/07/2009  ? HYPOTHYROIDISM 12/10/2007  ? Type 2 diabetes mellitus with hemoglobin A1c goal of less than 7.0% ([place]) 05/28/2007  ? Obstructive sleep apnea 05/28/2007  ? Coronary atherosclerosis 05/28/2007  ? Seasonal and perennial allergic rhinitis 05/28/2007  ? COUGH, CHRONIC 05/28/2007  ? ? ?Artist Pais, PTA ?12/01/2021, 12:12 PM ? ?[place] ?[facility] ?[address] ?[place], [name], [address] ?Phone: [phone]   Fax:  [phone] ? ?Name: BYANKA LANDRUS ?MRN: [id number] ?Date of Birth: 1955-10-14 ? ? ? ?

## 2021-12-02 NOTE — Progress Notes (Signed)
? ? ?Office Visit  ?  ?Patient [name] ?Date of Encounter: 12/03/2021 ? ?Primary Care Provider:  Reynold Bowen, MD ?Primary Cardiologist:  Shelva Majestic, MD ? ?Chief Complaint  ?  ?66 year old female with a history of CAD, hypertension, hyperlipidemia, OSA not on CPAP, hypothyroidism, type 2 diabetes, CKD stage IV, asthma, and GERD presents for follow-up related to CAD and hypertension. ? ?Past Medical History  ?  ?Past Medical History:  ?Diagnosis Date  ? Acute bronchitis   ? Anemia   ? hx of anemia  ? Anginal pain ([place])   ? Arthritis   ? osteo  ? Asthma   ? Chronic kidney disease (CKD), stage IV (severe) (HCC)   ? Coronary atherosclerosis   ? Cough   ? GERD (gastroesophageal reflux disease)   ? History of colon polyps   ? History of migraine   ? Hyperlipidemia   ? Hypertension   ? Obstructive sleep apnea (adult) (pediatric)   ? NO CPAP   ? Peripheral neuropathy   ? Type II or unspecified type diabetes mellitus without mention of complication, not stated as uncontrolled   ? type 2  ? Unspecified hypothyroidism   ? ?Past Surgical History:  ?Procedure Laterality Date  ? ANTERIOR FUSION CERVICAL SPINE  2009  ? CARPAL TUNNEL RELEASE Left   ? COLONOSCOPY    ? CORONARY STENT INTERVENTION  2007  ? LEFT HEART CATHETERIZATION WITH CORONARY ANGIOGRAM N/A 05/29/2014  ? Procedure: LEFT HEART CATHETERIZATION WITH CORONARY ANGIOGRAM;  Surgeon: Sinclair Grooms, MD;  Location: [facility] CATH LAB;  Service: Cardiovascular;  Laterality: N/A;  ? 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  ? Procedure: TOTAL KNEE ARTHROPLASTY;  Surgeon: Susa Day, MD;  Location: [facility];  Service: Orthopedics;  Laterality: Left;  3 hrs  ? TOTAL KNEE ARTHROPLASTY Right 10/01/2021  ? Procedure: TOTAL KNEE ARTHROPLASTY;  Surgeon: Susa Day, MD;  Location: [facility];  Service: Orthopedics;  Laterality: Right;  ? TUBAL LIGATION  1990  ? WISDOM TOOTH EXTRACTION    ? ? ?Allergies ? ?Allergies  ?Allergen Reactions  ? Allopurinol Rash  ?  Toxic  epidermal necrolysis ?  ? Codeine Other (See Comments)  ?  REACTION: hallucinations/"loopy"  ? Levaquin [Levofloxacin] Nausea And Vomiting  ? Sulfa Antibiotics Hives  ? Sulfonamide Derivatives Hives  ? ? ?History of Present Illness  ?  ?66 year old female with the above past medical history including CAD, hypertension, hyperlipidemia, OSA not on CPAP, hypothyroidism, type 2 diabetes, CKD stage IV, asthma, and GERD. ?  ?She is followed by Dr. Claiborne Billings. She has a known history of CAD and is s/p DES-pRCA in 2007, she was noted to have 60-70% LAD and 20% LCx stenosis.  Repeat cardiac catheterization in 2010 in the setting of recurrent chest pain showed 60% LAD stenosis, 20-30% circumflex stenosis and a widely patent RCA stent. More recently she had a high risk stress test in 2015 and underwent repeat heart catheterization at that time showing 50% p-mLAD stenosis with negative FFR , D1 70%, and patent RCA stent. Her last echocardiogram 04/2019 showed EF 60-65%, normal LV diastolic function, mild LVH, and mild AI. Her last echocardiogram 04/2019 showed EF 60-65%, normal LV diastolic function, mild LVH, and mild AI.  She has a history of orthostatic hypotension.  Maxide was previously discontinued in the setting of orthostatic hypotension, however, it was restarted due to worsening lower extremity edema. She was last seen in the office on 08/10/2021 and was stable from a  cardiac standpoint.  Her BP was low, Imdur was decreased from 60 mg daily to 30 mg daily.  She underwent right total knee arthroplasty in February 2023.  She presented to the ED on 11/15/2021 with complaints of epigastric and chest pain.  EKG was unremarkable, troponin was negative.  Her symptoms improved with Maalox. She called our office on 11/18/2021 with complaints of low blood pressure, "feeling rundown." ?  ?She was last seen in the office on 11/19/2021 and reported ongoing intermittent lightheadedness, presyncope, with associated shortness of breath and mild  chest discomfort.  Her symptoms occurred in the setting of positional changes.  She also noted lower BP in the setting of recent weight loss.  Her Imdur was decreased, additionally, her metoprolol was decreased. She presents today for follow-up. Since her last visit she has done well from a cardiac standpoint. She denies any further dizziness/lightheadedness, denies any symptoms concerning for angina. BP is stable. Overall she is feeling well, and denies any new concerns today.  ? ?Home Medications  ?  ?Current Outpatient Medications  ?Medication Sig Dispense Refill  ? albuterol (PROVENTIL HFA) 108 (90 Base) MCG/ACT inhaler Inhale 2 puffs into the lungs every 6 (six) hours as needed for wheezing or shortness of breath. For shortness of breath and wheezing 18 each 2  ? Ascorbic Acid (VITAMIN C PO) Take 1 tablet by mouth daily.    ? aspirin EC 81 MG tablet Take 1 tablet (81 mg total) by mouth 2 (two) times daily after a meal. Day after surgery 60 tablet 1  ? BIOTIN PO Take 500 mg by mouth daily.    ? Cyanocobalamin (B-12 PO) Take 1,000 mcg by mouth daily.    ? Dexlansoprazole 30 MG capsule DR Take 30 mg by mouth daily.    ? diazepam (VALIUM) 5 MG tablet Take 5 mg by mouth daily as needed for muscle spasms (cramps).    ? docusate sodium (COLACE) 100 MG capsule Take 1 capsule (100 mg total) by mouth 2 (two) times daily as needed for mild constipation. 30 capsule 1  ? ferrous sulfate 325 (65 FE) MG tablet Take 325 mg by mouth daily with breakfast.    ? Finerenone (KERENDIA) 10 MG TABS Take 10 mg by mouth daily.    ? fluticasone (FLONASE) 50 MCG/ACT nasal spray Place 2 sprays into both nostrils daily. (Patient taking differently: Place 2 sprays into both nostrils daily as needed for allergies.) 18.2 mL 11  ? fluticasone-salmeterol (ADVAIR HFA) 230-21 MCG/ACT inhaler Inhale 2 puffs into the lungs 2 (two) times daily. 8 g 6  ? furosemide (LASIX) 40 MG tablet Take 1 tablet (40 mg total) by mouth daily. In the morning  (Patient taking differently: Take 40-60 mg by mouth See admin instructions. Take 60 mg in the morning and 20 mg bedtime) 90 tablet 3  ? hydrocortisone 2.5 % cream Apply topically 2 (two) times daily. Apply as needed two times daily 30 g 0  ? insulin lispro (HUMALOG) 100 UNIT/ML KwikPen Inject 5-10 Units into the skin See admin instructions. Inject 5 units subcutaneously up to three times daily if CBG >200, inject 10 units up to three times daily if CBG >300    ? ipratropium-albuterol (DUONEB) 0.5-2.5 (3) MG/3ML SOLN Take 3 mLs by nebulization every 4 (four) hours as needed. 360 mL 11  ? isosorbide mononitrate (IMDUR) 30 MG 24 hr tablet Take 1 tablet (30 mg total) by mouth daily. 30 tablet 6  ? JARDIANCE 10 MG TABS  tablet Take 10 mg by mouth daily.    ? Lancets (ONETOUCH DELICA PLUS [id number]) MISC Apply 1 each topically 4 (four) times daily.    ? levothyroxine (SYNTHROID) 150 MCG tablet Take 150 mcg by mouth daily before breakfast.    ? Mepolizumab (NUCALA) 100 MG/ML SOAJ Inject 1 mL (100 mg total) into the skin every 28 (twenty-eight) days. 1 mL 5  ? methocarbamol (ROBAXIN) 500 MG tablet Take 1 tablet (500 mg total) by mouth every 8 (eight) hours as needed for muscle spasms. 40 tablet 1  ? metoprolol succinate (TOPROL-XL) 25 MG 24 hr tablet Take 1.5 tablets (37.5 mg total) by mouth daily. 45 tablet 3  ? montelukast (SINGULAIR) 10 MG tablet Take 1 tablet (10 mg total) by mouth at bedtime. 30 tablet 11  ? Multiple Vitamin (MULTIVITAMIN WITH MINERALS) TABS tablet Take 1 tablet by mouth daily.    ? nitroGLYCERIN (NITROLINGUAL) 0.4 MG/SPRAY spray Place 1 spray under the tongue every 5 (five) minutes x 3 doses as needed for chest pain. 12 g 3  ? NOVOFINE PLUS PEN NEEDLE 32G X 4 MM MISC Inject into the skin.    ? ONE TOUCH ULTRA TEST test strip Use as directed    ? oxyCODONE (OXY IR/ROXICODONE) 5 MG immediate release tablet Take 1 tablet (5 mg total) by mouth every 4 (four) hours as needed for severe pain. 40 tablet 0  ?  polyethylene glycol (MIRALAX / GLYCOLAX) 17 g packet Take 17 g by mouth daily. 14 each 0  ? potassium chloride (KLOR-CON M) 10 MEQ tablet Take 1 tablet (10 mEq total) by mouth daily. 30 tablet 2  ? pregabalin

## 2021-12-03 ENCOUNTER — Encounter: Payer: Self-pay | Admitting: Nurse Practitioner

## 2021-12-03 ENCOUNTER — Ambulatory Visit (INDEPENDENT_AMBULATORY_CARE_PROVIDER_SITE_OTHER): Payer: Medicare Other | Admitting: Nurse Practitioner

## 2021-12-03 VITALS — BP 116/60 | HR 63 | Resp 20 | Ht 62.0 in | Wt 158.0 lb

## 2021-12-03 DIAGNOSIS — I951 Orthostatic hypotension: Secondary | ICD-10-CM

## 2021-12-03 DIAGNOSIS — I10 Essential (primary) hypertension: Secondary | ICD-10-CM | POA: Diagnosis not present

## 2021-12-03 DIAGNOSIS — N184 Chronic kidney disease, stage 4 (severe): Secondary | ICD-10-CM

## 2021-12-03 DIAGNOSIS — I2510 Atherosclerotic heart disease of native coronary artery without angina pectoris: Secondary | ICD-10-CM

## 2021-12-03 DIAGNOSIS — Z794 Long term (current) use of insulin: Secondary | ICD-10-CM

## 2021-12-03 DIAGNOSIS — G4733 Obstructive sleep apnea (adult) (pediatric): Secondary | ICD-10-CM

## 2021-12-03 DIAGNOSIS — E785 Hyperlipidemia, unspecified: Secondary | ICD-10-CM | POA: Diagnosis not present

## 2021-12-03 DIAGNOSIS — E118 Type 2 diabetes mellitus with unspecified complications: Secondary | ICD-10-CM

## 2021-12-03 DIAGNOSIS — E876 Hypokalemia: Secondary | ICD-10-CM

## 2021-12-03 NOTE — Patient Instructions (Addendum)
Medication Instructions:  ?Stop Potassium 20 meg Monday, Wednesday and Friday. Continue Potassium 10 meq daily as directed.  ? ?*If you need a refill on your cardiac medications before your next appointment, please call your pharmacy* ? ? ?Lab Work: ?Your physician recommends that you complete labs today.  ?BMET ? ?If you have labs (blood work) drawn today and your tests are completely normal, you will receive your results only by: ?MyChart Message (if you have MyChart) OR ?A paper copy in the mail ?If you have any lab test that is abnormal or we need to change your treatment, we will call you to review the results. ? ? ?Testing/Procedures: ?NONE ordered at this time of appointment  ? ? ?Follow-Up: ?At [facility], you and your health needs are our priority.  As part of our continuing mission to provide you with exceptional heart care, we have created designated Provider Care Teams.  These Care Teams include your primary Cardiologist (physician) and Advanced Practice Providers (APPs -  Physician Assistants and Nurse Practitioners) who all work together to provide you with the care you need, when you need it. ? ?We recommend signing up for the patient portal called "MyChart".  Sign up information is provided on this After Visit Summary.  MyChart is used to connect with patients for Virtual Visits (Telemedicine).  Patients are able to view lab/test results, encounter notes, upcoming appointments, etc.  Non-urgent messages can be sent to your provider as well.   ?To learn more about what you can do with MyChart, go to [email or web address].   ? ?Your next appointment:   ?2-3 month(s) ? ?The format for your next appointment:   ?In Person ? ?Provider:   ?Shelva Majestic, MD   ? ? ?Other Instructions ? ? ?Important Information About Sugar ? ? ? ? ?].inst ?

## 2021-12-04 LAB — BASIC METABOLIC PANEL
BUN/Creatinine Ratio: 14 (ref 12–28)
BUN: 26 mg/dL (ref 8–27)
CO2: 26 mmol/L (ref 20–29)
Calcium: 9.2 mg/dL (ref 8.7–10.3)
Chloride: 101 mmol/L (ref 96–106)
Creatinine, Ser: 1.8 mg/dL — ABNORMAL HIGH (ref 0.57–1.00)
Glucose: 94 mg/dL (ref 70–99)
Potassium: 4.3 mmol/L (ref 3.5–5.2)
Sodium: 142 mmol/L (ref 134–144)
eGFR: 31 mL/min/{1.73_m2} — ABNORMAL LOW (ref >59)

## 2021-12-06 ENCOUNTER — Telehealth: Payer: Self-pay

## 2021-12-06 NOTE — Telephone Encounter (Signed)
Spoke with pt. Pt was notified of lab results and will follow up as planned.  

## 2021-12-07 ENCOUNTER — Ambulatory Visit: Payer: Medicare Other | Attending: Orthopedic Surgery | Admitting: Physical Therapy

## 2021-12-07 ENCOUNTER — Encounter: Payer: Self-pay | Admitting: Physical Therapy

## 2021-12-07 DIAGNOSIS — M25511 Pain in right shoulder: Secondary | ICD-10-CM | POA: Insufficient documentation

## 2021-12-07 DIAGNOSIS — R293 Abnormal posture: Secondary | ICD-10-CM | POA: Insufficient documentation

## 2021-12-07 DIAGNOSIS — G8929 Other chronic pain: Secondary | ICD-10-CM | POA: Insufficient documentation

## 2021-12-07 DIAGNOSIS — R29898 Other symptoms and signs involving the musculoskeletal system: Secondary | ICD-10-CM | POA: Diagnosis present

## 2021-12-07 DIAGNOSIS — M6281 Muscle weakness (generalized): Secondary | ICD-10-CM | POA: Diagnosis present

## 2021-12-07 DIAGNOSIS — M25561 Pain in right knee: Secondary | ICD-10-CM | POA: Diagnosis present

## 2021-12-07 DIAGNOSIS — R600 Localized edema: Secondary | ICD-10-CM | POA: Insufficient documentation

## 2021-12-07 DIAGNOSIS — R262 Difficulty in walking, not elsewhere classified: Secondary | ICD-10-CM | POA: Diagnosis present

## 2021-12-07 DIAGNOSIS — M25661 Stiffness of right knee, not elsewhere classified: Secondary | ICD-10-CM | POA: Insufficient documentation

## 2021-12-07 NOTE — Therapy (Signed)
[facility] ?[place] ?[place], [name], [address] ?Phone: [phone]   Fax:  [phone] ? ?Physical Therapy Treatment ? ?Patient Details  ?[name] ?MRN: [id number] ?Date of Birth: [birth year] ?Referring Provider (PT): Lacie Draft PA-C/Jeffrey Beane MD ? ? ?Encounter Date: 12/07/2021 ? ? PT End of Session - 12/07/21 1101   ? ? Visit Number 9   ? Date for PT Re-Evaluation 12/28/21   ? Authorization Type UHC MCR   ? Progress Note Due on Visit 10   ? PT Start Time 1101   ? PT Stop Time 1147   ? PT Time Calculation (min) 46 min   ? Activity Tolerance Patient tolerated treatment well   ? Behavior During Therapy [facility] for tasks assessed/performed   ? ?  ?  ? ?  ? ? ?Past Medical History:  ?Diagnosis Date  ? Acute bronchitis   ? Anemia   ? hx of anemia  ? Anginal pain ([place])   ? Arthritis   ? osteo  ? Asthma   ? Chronic kidney disease (CKD), stage IV (severe) (HCC)   ? Coronary atherosclerosis   ? Cough   ? GERD (gastroesophageal reflux disease)   ? History of colon polyps   ? History of migraine   ? Hyperlipidemia   ? Hypertension   ? Obstructive sleep apnea (adult) (pediatric)   ? NO CPAP   ? Peripheral neuropathy   ? Type II or unspecified type diabetes mellitus without mention of complication, not stated as uncontrolled   ? type 2  ? Unspecified hypothyroidism   ? ? ?Past Surgical History:  ?Procedure Laterality Date  ? ANTERIOR FUSION CERVICAL SPINE  2009  ? CARPAL TUNNEL RELEASE Left   ? COLONOSCOPY    ? CORONARY STENT INTERVENTION  2007  ? LEFT HEART CATHETERIZATION WITH CORONARY ANGIOGRAM N/A 05/29/2014  ? Procedure: LEFT HEART CATHETERIZATION WITH CORONARY ANGIOGRAM;  Surgeon: Sinclair Grooms, MD;  Location: [facility] CATH LAB;  Service: Cardiovascular;  Laterality: N/A;  ? 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  ? Procedure: TOTAL KNEE ARTHROPLASTY;  Surgeon: Susa Day, MD;  Location: [facility] ORS;  Service: Orthopedics;  Laterality:  Left;  3 hrs  ? TOTAL KNEE ARTHROPLASTY Right 10/01/2021  ? Procedure: TOTAL KNEE ARTHROPLASTY;  Surgeon: Susa Day, MD;  Location: [facility] ORS;  Service: Orthopedics;  Laterality: Right;  ? TUBAL LIGATION  1990  ? WISDOM TOOTH EXTRACTION    ? ? ?There were no vitals filed for this visit. ? ? Subjective Assessment - 12/07/21 1103   ? ? Subjective Pt reports return of medial knee pain which seems to be related to when they have been working on the wound at her distal surgical incision. New referral received from Dr. Tonita Cong to address her R shoulder pain but we decided to finish out her knee POC and then switch to her shoulder as we are nearly at the end of the knee POC.   ? Pertinent History Left TKA, ACDF, DM, COPD, OA, HTN   ? Patient Stated Goals Get the right knee like the left   ? Currently in Pain? Yes   ? Pain Score 7    ? Pain Location Knee   ? Pain Orientation Right;Medial   ? Pain Descriptors / Indicators Throbbing   ? Pain Type Acute pain;Surgical pain   ? Pain Frequency Intermittent   ? Aggravating Factors  Pain increased when incisional wound bothering her   ? ?  ?  ? ?  ? ? ? ? ?  [facility] PT Assessment - 12/07/21 1101   ? ?  ? Assessment  ? Medical Diagnosis Aftercare following joint replacement surgery - R TKA   ? Referring Provider (PT) Lacie Draft PA-C/Jeffrey Beane MD   ? Next MD Visit 12/08/21 with Latanya Maudlin, MD for the wound on her distal surgical incision; unsure of date of next appt with Dr. Tonita Cong   ?  ? AROM  ? Right Knee Extension 2   1? with quad set in supine  ? Right Knee Flexion 124   ? ?  ?  ? ?  ? ? ? ? ? ? ? ? ? ? ? ? ? ? ? ? [facility] Adult PT Treatment/Exercise - 12/07/21 1101   ? ?  ? Knee/Hip Exercises: Stretches  ? Passive Hamstring Stretch Right;2 reps;30 seconds   ? Passive Hamstring Stretch Limitations hooklying with strap + DF for calf/gastroc stretch   ?  ? Knee/Hip Exercises: Aerobic  ? Recumbent Bike L3 x 6 min   ?  ? Knee/Hip Exercises: Supine  ? Bridges Both;10  reps;Strengthening   ? Bridges Limitations + green TB hip ABD isometric   ? Bridges with Greig Right Both;10 reps;Strengthening   ? Bridges with Clamshell Both;10 reps;Strengthening   ? Bridges with Clamshell --   green TB  ?  ? Knee/Hip Exercises: Sidelying  ? Clams R/L green TB clam 10 x 3"   ? ?  ?  ? ?  ? ? ? ? ? ? ? ? ? ? PT Education - 12/07/21 1147   ? ? Education Details HEP progression   ? Person(s) Educated Patient   ? Methods Explanation;Demonstration;Verbal cues;Handout   ? Comprehension Verbalized understanding;Verbal cues required;Returned demonstration   ? ?  ?  ? ?  ? ? ? PT Short Term Goals - 11/19/21 1140   ? ?  ? PT SHORT TERM GOAL #1  ? Title Patient to be independent with initial HEP.   ? Time 3   ? Period Weeks   ? Status Achieved   11/19/21  ? Target Date 11/23/21   ? ?  ?  ? ?  ? ? ? ? PT Long Term Goals - 12/07/21 1123   ? ?  ? PT LONG TERM GOAL #1  ? Title Patient to be independent with advanced HEP.   ? Status Partially Met   ? Target Date 12/28/21   ?  ? PT LONG TERM GOAL #2  ? Title Improved right knee ROM 0 - 120 deg or better to normalize gait and ADLS   ? Status Partially Met   12/07/21 - met for flexion (2-124?); 1? extension with quad set when supported in supine  ? Target Date 12/28/21   ?  ? PT LONG TERM GOAL #3  ? Title Patient able to climb stairs with 5/5 right knee stength and a reciprocal gait pattern.   ? Status Partially Met   11/26/21 - able to climb stairs reciprocally with good pattern  ? Target Date 12/28/21   ?  ? PT LONG TERM GOAL #4  ? Title Patient able to amb community distances safely with a normal gait pattern and no AD   ? Status Achieved   11/26/21  ?  ? PT LONG TERM GOAL #5  ? Title Patient able to perform ADLS with >= 88% reduction in pain.   ? Status Achieved   11/26/21 - pt denies any knee pain lately  ?  ? PT LONG  TERM GOAL #6  ? Title improved FOTO to 79 from 49 showing functional improvement   ? Status On-going   ? Target Date 12/28/21   ? ?  ?  ? ?   ? ? ? ? ? ? ? ? Plan - 12/07/21 1147   ? ? Clinical Impression Statement New referral received for PT to eval and treat for shoulder pain, but upon discussion with Asa Lente, we opted to finish out her knee POC as she is nearly meeting the majority of her goals and should be able to complete her knee episode this week. Will plan for her shoulder eval as of next week. Asa Lente reports some increased intermittent medial knee pain since Dr. Gladstone Lighter has been working on debriding the wound at her distal surgical incision but does not note any change in functional activity tolerance. R knee ROM continues to progress with current AROM 2-124? and knee extension down to 1? when performing quad set in supine. We reviewed her HEP and continued to progress proximal LE strengthening while targeting medial knee stability with HEP updated accordingly. Anticipate she should be ready to complete her TKA episode next visit upon final goal assessment.   ? Comorbidities DM, COPD, HTN   ? Rehab Potential Excellent   ? PT Frequency 2x / week   ? PT Duration 8 weeks   ? PT Treatment/Interventions ADLs/Self Care Home Management;Cryotherapy;Electrical Stimulation;Moist Heat;Gait training;Stair training;Functional mobility training;Therapeutic activities;Therapeutic exercise;Balance training;Neuromuscular re-education;Manual techniques;Patient/family education;Passive range of motion;Dry needling;Taping;Vasopneumatic Device   ? PT Next Visit Plan goal assessment for TKA episode - if ready to transition to HEP, will plan for R shoulder eval on next visit   ? PT Home Exercise Plan [email or web address]   ? Consulted and Agree with Plan of Care Patient   ? ?  ?  ? ?  ? ? ?Patient will benefit from skilled therapeutic intervention in order to improve the following deficits and impairments:  Decreased range of motion, Abnormal gait, Increased muscle spasms, Pain, Impaired flexibility, Decreased strength, Increased edema ? ?Visit Diagnosis: ?Stiffness of right knee,  not elsewhere classified ? ?Acute pain of right knee ? ?Localized edema ? ?Muscle weakness (generalized) ? ?Difficulty in walking, not elsewhere classified ? ? ? ? ?Problem List ?Patient Active Problem List  ? Diagnosis Date N

## 2021-12-07 NOTE — Patient Instructions (Signed)
? ?  Access Code: [email or web address] ?URL: [email or web address] ?Date: 12/07/2021 ?Prepared by: Annie Paras ? ?Exercises ?- Seated Knee Flexion Stretch  - 3 x daily - 7 x weekly - 1 sets - 3 reps - 30-60 sec hold ?- Supine Knee Extension Strengthening  - 1 x daily - 7 x weekly - 2 sets - 10 reps ?- Supine Heel Slide with Strap (Mirrored)  - 2 x daily - 7 x weekly - 1-2 sets - 10 reps ?- Supine Hamstring Stretch with Strap  - 2 x daily - 7 x weekly - 1 sets - 3 reps - 60 sec hold ?- Squat with Counter Support  - 1 x daily - 7 x weekly - 3 sets - 10 reps ?- Seated Knee Extension with Resistance  - 1 x daily - 7 x weekly - 3 sets - 10 reps ?- Seated Knee Flexion with Anchored Resistance  - 1 x daily - 7 x weekly - 3 sets - 10 reps ?- Standing Hip Flexion with Anchored Resistance and Chair Support  - 1 x daily - 3-4 x weekly - 2 sets - 10 reps - 3 sec hold ?- Standing Hip Adduction with Anchored Resistance  - 1 x daily - 3-4 x weekly - 2 sets - 10 reps - 3 sec hold ?- Standing Hip Extension with Anchored Resistance  - 1 x daily - 3-4 x weekly - 2 sets - 10 reps - 3 sec hold ?- Standing Hip Abduction with Anchored Resistance  - 1 x daily - 3-4 x weekly - 2 sets - 10 reps - 3 sec hold ?- Clamshell with Resistance  - 1 x daily - 3-4 x weekly - 2 sets - 10 reps - 3-5 sec hold ?- Bridge with Hip Abduction and Resistance  - 1 x daily - 3-4 x weekly - 2 sets - 10 reps - 5 sec hold ?- Supine Bridge with Mini Swiss Ball Between Knees  - 2 x daily - 3-4 x weekly - 2 sets - 10 reps - 5 sec hold ?

## 2021-12-09 ENCOUNTER — Telehealth: Payer: Self-pay | Admitting: Cardiovascular Disease

## 2021-12-09 NOTE — Telephone Encounter (Signed)
Patient is requesting to have her lab results faxed to Dr. Forde Dandy, PCP, at fax [phone] (attn: Varney Biles / Dr. Forde Dandy). ?

## 2021-12-09 NOTE — Telephone Encounter (Signed)
Patient states Dr Forde Dandy office was able to  see lab work  ?

## 2021-12-09 NOTE — Telephone Encounter (Signed)
SENT REQUEST INFORMATION TO FAX NUMBER- [email or web address]. ? CALLED PATIENT TO INFORM . - PONE WAS PICKED UP -  NO RESPONSE. ( HEARD VOICE IN THE BACKGROUND)  ?

## 2021-12-10 ENCOUNTER — Other Ambulatory Visit (HOSPITAL_COMMUNITY): Payer: Self-pay

## 2021-12-10 ENCOUNTER — Ambulatory Visit: Payer: Medicare Other | Admitting: Physical Therapy

## 2021-12-10 ENCOUNTER — Encounter: Payer: Self-pay | Admitting: Physical Therapy

## 2021-12-10 DIAGNOSIS — R600 Localized edema: Secondary | ICD-10-CM

## 2021-12-10 DIAGNOSIS — M25661 Stiffness of right knee, not elsewhere classified: Secondary | ICD-10-CM | POA: Diagnosis not present

## 2021-12-10 DIAGNOSIS — M6281 Muscle weakness (generalized): Secondary | ICD-10-CM

## 2021-12-10 DIAGNOSIS — R262 Difficulty in walking, not elsewhere classified: Secondary | ICD-10-CM

## 2021-12-10 DIAGNOSIS — M25561 Pain in right knee: Secondary | ICD-10-CM

## 2021-12-10 NOTE — Therapy (Signed)
[facility] ?[place] ?[place], [name], [address] ?Phone: [phone]   Fax:  [phone] ? ?Physical Therapy Treatment / Discharge Summary ? ?Patient Details  ?[name] ?MRN: [id number] ?Date of Birth: January 23, 1956 ?Referring Provider (PT): Lacie Draft PA-C/Jeffrey Beane MD ? ? ? ?Progress Note ? ?Reporting Period 11/02/2021 to 12/10/2021 ? ?See note below for Objective Data and Assessment of Progress/Goals.  ? ? ? ?Encounter Date: 12/10/2021 ? ? PT End of Session - 12/10/21 1106   ? ? Visit Number 10   ? Date for PT Re-Evaluation 12/28/21   ? Authorization Type UHC MCR   ? PT Start Time 1106   ? PT Stop Time 1146   ? PT Time Calculation (min) 40 min   ? Activity Tolerance Patient tolerated treatment well   ? Behavior During Therapy [facility] for tasks assessed/performed   ? ?  ?  ? ?  ? ? ?Past Medical History:  ?Diagnosis Date  ? Acute bronchitis   ? Anemia   ? hx of anemia  ? Anginal pain ([place])   ? Arthritis   ? osteo  ? Asthma   ? Chronic kidney disease (CKD), stage IV (severe) (HCC)   ? Coronary atherosclerosis   ? Cough   ? GERD (gastroesophageal reflux disease)   ? History of colon polyps   ? History of migraine   ? Hyperlipidemia   ? Hypertension   ? Obstructive sleep apnea (adult) (pediatric)   ? NO CPAP   ? Peripheral neuropathy   ? Type II or unspecified type diabetes mellitus without mention of complication, not stated as uncontrolled   ? type 2  ? Unspecified hypothyroidism   ? ? ?Past Surgical History:  ?Procedure Laterality Date  ? ANTERIOR FUSION CERVICAL SPINE  2009  ? CARPAL TUNNEL RELEASE Left   ? COLONOSCOPY    ? CORONARY STENT INTERVENTION  2007  ? LEFT HEART CATHETERIZATION WITH CORONARY ANGIOGRAM N/A 05/29/2014  ? Procedure: LEFT HEART CATHETERIZATION WITH CORONARY ANGIOGRAM;  Surgeon: Sinclair Grooms, MD;  Location: [facility] CATH LAB;  Service: Cardiovascular;  Laterality: N/A;  ? 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  ?  Procedure: TOTAL KNEE ARTHROPLASTY;  Surgeon: Susa Day, MD;  Location: [facility];  Service: Orthopedics;  Laterality: Left;  3 hrs  ? TOTAL KNEE ARTHROPLASTY Right 10/01/2021  ? Procedure: TOTAL KNEE ARTHROPLASTY;  Surgeon: Susa Day, MD;  Location: [facility];  Service: Orthopedics;  Laterality: Right;  ? TUBAL LIGATION  1990  ? WISDOM TOOTH EXTRACTION    ? ? ?There were no vitals filed for this visit. ? ? Subjective Assessment - 12/10/21 1108   ? ? Subjective Pt states she feels comfortable with the plan to wrap things up with her knee today and shift focus to her shoulder as of next visit.   ? Pertinent History Left TKA, ACDF, DM, COPD, OA, HTN   ? Patient Stated Goals Get the right knee like the left   ? Currently in Pain? No/denies   ? Pain Score 0-No pain   ? ?  ?  ? ?  ? ? ? ? ? [facility] PT Assessment - 12/10/21 1106   ? ?  ? Assessment  ? Medical Diagnosis Aftercare following joint replacement surgery - R TKA   ? Referring Provider (PT) Lacie Draft PA-C/Jeffrey Beane MD   ?  ? Observation/Other Assessments  ? Focus on Therapeutic Outcomes (FOTO)  99 -  40 point improvement from eval exceeding predicted value of 79   ?  ? AROM  ? Right Knee Extension 1   0? with quad set in supine  ? Right Knee Flexion 125   ?  ? Strength  ? Right Hip Flexion 4+/5   ? Right Hip Extension 4+/5   ? Right Hip External Rotation  4/5   painful catch in R hip  ? Right Hip Internal Rotation 4+/5   ? Right Hip ABduction 4/5   ? Right Hip ADduction 4/5   ? Left Hip Flexion 4+/5   ? Left Hip Extension 4+/5   ? Left Hip External Rotation 4/5   ? Left Hip Internal Rotation 5/5   ? Left Hip ABduction 4/5   ? Left Hip ADduction 4+/5   ? Right Knee Flexion 5/5   ? Right Knee Extension 5/5   ? Left Knee Flexion 5/5   ? Left Knee Extension 5/5   ? Right Ankle Dorsiflexion 5/5   ? Right Ankle Plantar Flexion 5/5   ? Left Ankle Dorsiflexion 5/5   ? Left Ankle Plantar Flexion 5/5   ?  ? Ambulation/Gait  ? Ambulation/Gait Assistance 7:  Independent   ? Gait Pattern Within Functional Limits;Step-through pattern   ? Stairs Assistance 6: Modified independent (Device/Increase time)   ? Stair Management Technique One rail Right;Alternating pattern;Forwards   ? ?  ?  ? ?  ? ? ? ? ? ? ? ? ? ? ? ? ? ? ? ? [place] Adult PT Treatment/Exercise - 12/10/21 1106   ? ?  ? Knee/Hip Exercises: Aerobic  ? Recumbent Bike L3 x 6 min   ?  ? Knee/Hip Exercises: Standing  ? Hip Flexion Right;Left;10 reps;Stengthening;Knee straight   ? Hip Flexion Limitations green TB on single ankle - anchored to mat table; 1 pole A for balance   ? Hip ADduction Right;Left;10 reps;Strengthening   ? Hip ADduction Limitations green TB on single ankle - anchored to mat table; 1 pole A for balance   ? Hip Abduction Right;Left;10 reps;Stengthening;Knee straight   ? Abduction Limitations green TB on single ankle - anchored to mat table; 1 pole A for balance   ? Hip Extension Right;Left;10 reps;Stengthening;Knee straight   ? Extension Limitations green TB on single ankle - anchored to mat table; 1 pole A for balance   ? ?  ?  ? ?  ? ? ? ? ? ? ? ? ? ? PT Education - 12/10/21 1148   ? ? Education Details Final review of HEP (progressing resistance to green TB for 4-way hip) and clarification of recommended ongoing HEP frequency   ? Person(s) Educated Patient   ? Methods Explanation   ? Comprehension Verbalized understanding   ? ?  ?  ? ?  ? ? ? PT Short Term Goals - 11/19/21 1140   ? ?  ? PT SHORT TERM GOAL #1  ? Title Patient to be independent with initial HEP.   ? Time 3   ? Period Weeks   ? Status Achieved   11/19/21  ? Target Date 11/23/21   ? ?  ?  ? ?  ? ? ? ? PT Long Term Goals - 12/10/21 1112   ? ?  ? PT LONG TERM GOAL #1  ? Title Patient to be independent with advanced HEP.   ? Status Achieved   12/10/21  ?  ? PT LONG TERM GOAL #2  ? Title Improved  right knee ROM 0 - 120 deg or better to normalize gait and ADLS   ? Status Achieved   12/10/21 - R knee AROM 1-125?, 0? extension with quad set when  supported in supine  ?  ? PT LONG TERM GOAL #3  ? Title Patient able to climb stairs with 5/5 right knee stength and a reciprocal gait pattern.   ? Status Achieved   ?  ? PT LONG TERM GOAL #4  ? Title Patient able to amb community distances safely with a normal gait pattern and no AD   ? Status Achieved   11/26/21  ?  ? PT LONG TERM GOAL #5  ? Title Patient able to perform ADLS with >= 75% reduction in pain.   ? Status Achieved   11/26/21 - pt denies any knee pain lately  ?  ? PT LONG TERM GOAL #6  ? Title improved FOTO to 79 from 77 showing functional improvement   ? Status Achieved   12/10/21 - FOTO = 99  ? ?  ?  ? ?  ? ? ? ? ? ? ? ? Plan - 12/10/21 1113   ? ? Clinical Impression Statement [name] is pleased with her progress following her R TKA, noting no R knee pain or issues with everyday mobility, walking and stair negotiation. Her FOTO score is now 99 (40-point improvement from eval) indicating no functional limitations with daily tasks. Her R knee AROM is 1-125? with knee extension to 0? with leg extended in supine. B knee and ankle strength now 5/5 and B hips 4/5 to 5/5 with greatest weakness laterally in hips. We reviewed and updated her HEP to target the remaining weakness and she was able to verbalize good understanding and/or perform good return demonstration of all exercises. All goals now met and we will proceed with discharge from PT for her R knee and will proceed with new evaluation next visit to address her complaints of R shoulder pain.   ? Comorbidities DM, COPD, HTN   ? PT Treatment/Interventions ADLs/Self Care Home Management;Cryotherapy;Electrical Stimulation;Moist Heat;Gait training;Stair training;Functional mobility training;Therapeutic activities;Therapeutic exercise;Balance training;Neuromuscular re-education;Manual techniques;Patient/family education;Passive range of motion;Dry needling;Taping;Vasopneumatic Device   ? PT Next Visit Plan discharge from PT for R TKA episode; R shoulder eval on  next visit 12/16/21   ? PT Home Exercise Plan [email or web address]   ? Consulted and Agree with Plan of Care Patient   ? ?  ?  ? ?  ? ? ?Patient will benefit from skilled therapeutic intervention in order to improve the fo

## 2021-12-14 ENCOUNTER — Encounter: Payer: TRICARE For Life (TFL) | Admitting: Physical Therapy

## 2021-12-14 NOTE — Therapy (Signed)
?OUTPATIENT PHYSICAL THERAPY SHOULDER EVALUATION ? ? ?Patient [name] ?MRN: [id number] ?DOB:10-Oct-1955, 66 y.o., female ?Today's Date: 12/16/2021 ? ? PT End of Session - 12/16/21 1017   ? ? Visit Number 1   ? Number of Visits 12   ? Date for PT Re-Evaluation 01/27/22   ? Authorization Type UHC MCR   ? PT Start Time 1017   ? PT Stop Time 1107   ? PT Time Calculation (min) 50 min   ? Activity Tolerance Patient tolerated treatment well   ? Behavior During Therapy [facility] for tasks assessed/performed   ? ?  ?  ? ?  ? ? ?Past Medical History:  ?Diagnosis Date  ? Acute bronchitis   ? Anemia   ? hx of anemia  ? Anginal pain ([place])   ? Arthritis   ? osteo  ? Asthma   ? Chronic kidney disease (CKD), stage IV (severe) (HCC)   ? Coronary atherosclerosis   ? Cough   ? GERD (gastroesophageal reflux disease)   ? History of colon polyps   ? History of migraine   ? Hyperlipidemia   ? Hypertension   ? Obstructive sleep apnea (adult) (pediatric)   ? NO CPAP   ? Peripheral neuropathy   ? Type II or unspecified type diabetes mellitus without mention of complication, not stated as uncontrolled   ? type 2  ? Unspecified hypothyroidism   ? ?Past Surgical History:  ?Procedure Laterality Date  ? ANTERIOR FUSION CERVICAL SPINE  2009  ? CARPAL TUNNEL RELEASE Left   ? COLONOSCOPY    ? CORONARY STENT INTERVENTION  2007  ? LEFT HEART CATHETERIZATION WITH CORONARY ANGIOGRAM N/A 05/29/2014  ? Procedure: LEFT HEART CATHETERIZATION WITH CORONARY ANGIOGRAM;  Surgeon: Sinclair Grooms, MD;  Location: [facility] CATH LAB;  Service: Cardiovascular;  Laterality: N/A;  ? 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  ? Procedure: TOTAL KNEE ARTHROPLASTY;  Surgeon: Susa Day, MD;  Location: [facility] ORS;  Service: Orthopedics;  Laterality: Left;  3 hrs  ? TOTAL KNEE ARTHROPLASTY Right 10/01/2021  ? Procedure: TOTAL KNEE ARTHROPLASTY;  Surgeon: Susa Day, MD;  Location: [facility] ORS;  Service: Orthopedics;  Laterality: Right;  ? TUBAL LIGATION  1990  ? WISDOM  TOOTH EXTRACTION    ? ?Patient Active Problem List  ? Diagnosis Date Noted  ? 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 Severe persistent asthma 10/05/2020  ? 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 Allergic reaction 04/26/2020  ? Acute renal failure superimposed on stage 3 chronic kidney disease ([place]) 04/26/2020  ? Stomatitis 04/26/2020  ? Allergic reaction caused by a drug 04/26/2020  ? Rash   ? Left knee DJD 02/05/2020  ? Acute bronchitis 08/10/2014  ? Unstable angina ([place]) 05/29/2014  ? Abnormal nuclear stress test 05/29/2014  ? Chest pain 05/06/2014  ? Exertional shortness of breath 05/06/2014  ? Mixed hyperlipidemia 05/06/2014  ? Hyperlipidemia with target LDL less than 70 08/05/2013  ? HTN (hypertension) 08/05/2013  ? Chest wall pain 08/05/2013  ? SINUSITIS, ACUTE 04/07/2009  ? HYPOTHYROIDISM 12/10/2007  ? Type 2 diabetes mellitus with hemoglobin A1c goal of less than 7.0% ([place]) 05/28/2007  ? Obstructive sleep apnea 05/28/2007  ? Coronary atherosclerosis 05/28/2007  ? Seasonal and perennial allergic rhinitis 05/28/2007  ? COUGH, CHRONIC 05/28/2007  ? ? ?PCP: Reynold Bowen, MD ? ?REFERRING PROVIDER: Susa Day, MD ? ?REFERRING DIAG: M25.511 (ICD-10-CM) - Pain in right shoulder ? ?THERAPY DIAG:  ?Chronic right shoulder pain ? ?Abnormal  posture ? ?Muscle weakness (generalized) ? ?Other symptoms and signs involving the musculoskeletal system ? ? ?ONSET DATE: chronic for ~5 yrs ? ?SUBJECTIVE:                                                                                                                                                                                     ? 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 Can get some relied from massage but pain does not go away. Pain limits her ability to raise her R arm over head or lift anything with her R arm. She has been told she has a torn  RTC as of imaging completed several yrs ago. ? ?PERTINENT HISTORY: ?Recent R TKA (10/01/21) with delayed incisional healing, L TKA 02/05/20, ACDF 2009, OA, DM-II, COPD, HTN, CAD, stage 4 CKD, peripheral neuropathy ? ?PAIN:  ?Are you having pain? Yes: NPRS scale: 4/10 ?Pain location: R anterior & posterior shoulder as well as periscapular ?Pain description: constant throbbing ?Aggravating factors: liftng, pulling, overuse esp with overhead activities ?Relieving factors: massage or self-STM with ball on wall, Tylenol, rest, heat or ice ? ?PRECAUTIONS: None ? ?WEIGHT BEARING RESTRICTIONS No ? ?FALLS:  ?Has patient fallen in last 6 months? No ? ?LIVING ENVIRONMENT: ?Lives with:  sister ?Lives in: House/apartment ?Stairs: Yes: Internal: 14 steps; on right going up and External: 4 steps; on right going up, on left going up, and can reach both ?Has following equipment at home: Single point cane, Walker - 2 wheeled, and Wheelchair (manual) ? ?OCCUPATION: ?Retired ? ?PLOF: Independent and Leisure: goes to gym & walking around mall ? ?PATIENT GOALS "To have less pain and more ROM, including lifting more overhead w/o me screaming." ? ?OBJECTIVE:  ? ?DIAGNOSTIC FINDINGS:  ?No recent imaging. Per pt, imaging several years ago revealed RTC tear. ? ?PATIENT SURVEYS:  ?FOTO Shoulder = 59 ? ?COGNITION: ? Overall cognitive status: Within functional limits for tasks assessed ?    ?SENSATION: ?WFL ?Except numbness in R 4th & 5th digits ? ?POSTURE: ?Fwd head and rounded shoulders with protracted scapula R>L ? ?UPPER EXTREMITY ROM:  ? ?Active ROM Right ?12/14/2021 Left ?12/14/2021  ?Shoulder flexion 152 - pain upon lowering 154  ?Shoulder extension 47 67  ?Shoulder abduction 121 - pain upon lowering 146  ?Shoulder adduction    ?Shoulder internal rotation 52 80  ?Shoulder external rotation 48 79  ?Elbow flexion    ?Elbow extension    ?Wrist flexion    ?Wrist extension    ?Wrist ulnar deviation    ?  Wrist radial deviation    ?Wrist pronation     ?Wrist supination    ?(Blank rows = not tested) ? ?R shoulder PROM WNL w/o pain except IR mildly limited with painful catch ? ?UPPER EXTREMITY MMT: ? ?MMT Right* ?12/14/2021 Left ?12/14/2021  ?Shoulder flexion 3+ 4-  ?Shoulder extension 4- 4  ?Shoulder abduction 3+ 4-  ?Shoulder adduction    ?Shoulder internal rotation 4- 4  ?Shoulder external rotation 3 4-  ?Middle trapezius    ?Lower trapezius    ?Elbow flexion    ?Elbow extension    ?Wrist flexion    ?Wrist extension    ?Wrist ulnar deviation    ?Wrist radial deviation    ?Wrist pronation    ?Wrist supination    ?Grip strength (lbs)    ?(Blank rows = not tested) ? ?*Pain with all resisted motions on R except IR & extension ? ?SHOULDER SPECIAL TESTS: ? Impingement tests: Hawkins/Kennedy impingement test: positive  ? Rotator cuff assessment: Empty can test: positive  and Full can test: negative ?  ?JOINT MOBILITY TESTING:  ?R shoulder WFL ? ?PALPATION:  ?Increased muscle tension and TTP in R pecs/anterior shoulder as well as posterior shoulder complex. ?  ?TODAY'S TREATMENT:  ?12/16/21  ?THERAPEUTIC EXERCISES: Instruction in initial HEP: ?- Doorway R Pec Stretch at 60 Degrees Abduction with Arm Straight  - 2-3 x daily - 7 x weekly - 3 reps - 30 sec hold ?- Single Arm Doorway R Pec Stretch at 90 Degrees Abduction  - 2-3 x daily - 7 x weekly - 3 reps - 30 sec hold ?- Single Arm Doorway R Pec Stretch at 120 Degrees Abduction  - 2-3 x daily - 7 x weekly - 3 reps - 30 sec hold ?- Seated Gentle R Upper Trapezius Stretch  - 2-3 x daily - 7 x weekly - 3 reps - 30 sec hold ?- Gentle R Levator Scapulae Stretch (Mirrored)  - 2-3 x daily - 7 x weekly - 3 reps - 30 sec hold ?- Seated B Scapular Retraction  - 2-3 x daily - 7 x weekly - 2 sets - 10 reps - 5 sec hold ? ? ?PATIENT EDUCATION: ?Education details:  PT eval findings, anticipated POC, and initial HEP ?Person educated: Patient ?Education method: Explanation, Demonstration, Verbal cues, and Handouts ?Education comprehension:  verbalized understanding, returned demonstration, verbal cues required, and needs further education ? ? ?HOME EXERCISE PROGRAM: ?Access Code: [email or web address] ? ?ASSESSMENT: ? ?CLINICAL IMPRESSION: ?Asa Lente is a 25 y.

## 2021-12-15 ENCOUNTER — Other Ambulatory Visit (HOSPITAL_COMMUNITY): Payer: Self-pay

## 2021-12-16 ENCOUNTER — Encounter: Payer: Self-pay | Admitting: Physical Therapy

## 2021-12-16 ENCOUNTER — Ambulatory Visit: Payer: Medicare Other | Attending: Specialist | Admitting: Physical Therapy

## 2021-12-16 ENCOUNTER — Other Ambulatory Visit: Payer: Self-pay

## 2021-12-16 DIAGNOSIS — R29898 Other symptoms and signs involving the musculoskeletal system: Secondary | ICD-10-CM | POA: Insufficient documentation

## 2021-12-16 DIAGNOSIS — G8929 Other chronic pain: Secondary | ICD-10-CM | POA: Insufficient documentation

## 2021-12-16 DIAGNOSIS — M25511 Pain in right shoulder: Secondary | ICD-10-CM | POA: Diagnosis present

## 2021-12-16 DIAGNOSIS — R293 Abnormal posture: Secondary | ICD-10-CM | POA: Diagnosis present

## 2021-12-16 DIAGNOSIS — M6281 Muscle weakness (generalized): Secondary | ICD-10-CM | POA: Diagnosis present

## 2021-12-20 ENCOUNTER — Other Ambulatory Visit (HOSPITAL_COMMUNITY): Payer: Self-pay

## 2021-12-21 ENCOUNTER — Ambulatory Visit: Payer: Medicare Other

## 2021-12-21 DIAGNOSIS — M6281 Muscle weakness (generalized): Secondary | ICD-10-CM

## 2021-12-21 DIAGNOSIS — M25511 Pain in right shoulder: Secondary | ICD-10-CM | POA: Diagnosis not present

## 2021-12-21 DIAGNOSIS — G8929 Other chronic pain: Secondary | ICD-10-CM

## 2021-12-21 DIAGNOSIS — R293 Abnormal posture: Secondary | ICD-10-CM

## 2021-12-21 DIAGNOSIS — R29898 Other symptoms and signs involving the musculoskeletal system: Secondary | ICD-10-CM

## 2021-12-21 NOTE — Therapy (Signed)
?OUTPATIENT PHYSICAL THERAPY TREATMENT NOTE ? ? ?Patient [name] ?MRN: [id number] ?DOB:02/12/1956, 66 y.o., female ?Today's Date: 12/21/2021 ? ? PT End of Session - 12/21/21 1402   ? ? Visit Number 2   ? Number of Visits 12   ? Date for PT Re-Evaluation 01/27/22   ? Authorization Type UHC MCR   ? PT Start Time 6834   ? PT Stop Time 1400   ? PT Time Calculation (min) 43 min   ? Activity Tolerance Patient tolerated treatment well   ? Behavior During Therapy [facility] for tasks assessed/performed   ? ?  ?  ? ?  ? ? ? ?Past Medical History:  ?Diagnosis Date  ? Acute bronchitis   ? Anemia   ? hx of anemia  ? Anginal pain ([place])   ? Arthritis   ? osteo  ? Asthma   ? Chronic kidney disease (CKD), stage IV (severe) (HCC)   ? Coronary atherosclerosis   ? Cough   ? GERD (gastroesophageal reflux disease)   ? History of colon polyps   ? History of migraine   ? Hyperlipidemia   ? Hypertension   ? Obstructive sleep apnea (adult) (pediatric)   ? NO CPAP   ? Peripheral neuropathy   ? Type II or unspecified type diabetes mellitus without mention of complication, not stated as uncontrolled   ? type 2  ? Unspecified hypothyroidism   ? ?Past Surgical History:  ?Procedure Laterality Date  ? ANTERIOR FUSION CERVICAL SPINE  2009  ? CARPAL TUNNEL RELEASE Left   ? COLONOSCOPY    ? CORONARY STENT INTERVENTION  2007  ? LEFT HEART CATHETERIZATION WITH CORONARY ANGIOGRAM N/A 05/29/2014  ? Procedure: LEFT HEART CATHETERIZATION WITH CORONARY ANGIOGRAM;  Surgeon: Sinclair Grooms, MD;  Location: [facility] CATH LAB;  Service: Cardiovascular;  Laterality: N/A;  ? 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  ? Procedure: TOTAL KNEE ARTHROPLASTY;  Surgeon: Susa Day, MD;  Location: [facility] ORS;  Service: Orthopedics;  Laterality: Left;  3 hrs  ? TOTAL KNEE ARTHROPLASTY Right 10/01/2021  ? Procedure: TOTAL KNEE ARTHROPLASTY;  Surgeon: Susa Day, MD;  Location: [facility];  Service: Orthopedics;  Laterality: Right;  ? TUBAL LIGATION  1990  ? WISDOM TOOTH  EXTRACTION    ? ?Patient Active Problem List  ? Diagnosis Date Noted  ? 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 Severe persistent asthma 10/05/2020  ? 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 Allergic reaction 04/26/2020  ? Acute renal failure superimposed on stage 3 chronic kidney disease ([place]) 04/26/2020  ? Stomatitis 04/26/2020  ? Allergic reaction caused by a drug 04/26/2020  ? Rash   ? Left knee DJD 02/05/2020  ? Acute bronchitis 08/10/2014  ? Unstable angina ([place]) 05/29/2014  ? Abnormal nuclear stress test 05/29/2014  ? Chest pain 05/06/2014  ? Exertional shortness of breath 05/06/2014  ? Mixed hyperlipidemia 05/06/2014  ? Hyperlipidemia with target LDL less than 70 08/05/2013  ? HTN (hypertension) 08/05/2013  ? Chest wall pain 08/05/2013  ? SINUSITIS, ACUTE 04/07/2009  ? HYPOTHYROIDISM 12/10/2007  ? Type 2 diabetes mellitus with hemoglobin A1c goal of less than 7.0% ([place]) 05/28/2007  ? Obstructive sleep apnea 05/28/2007  ? Coronary atherosclerosis 05/28/2007  ? Seasonal and perennial allergic rhinitis 05/28/2007  ? COUGH, CHRONIC 05/28/2007  ? ? ?PCP: Reynold Bowen, MD ? ?REFERRING PROVIDER: Susa Day, MD ? ?REFERRING DIAG: M25.511 (ICD-10-CM) - Pain in right shoulder ? ?THERAPY DIAG:  ?Chronic right shoulder pain ? ?  Abnormal posture ? ?Muscle weakness (generalized) ? ?Other symptoms and signs involving the musculoskeletal system ? ? ?ONSET DATE: chronic for ~5 yrs ? ?SUBJECTIVE:                                                                                                                                                                                     ? ?SUBJECTIVE STATEMENT: ?Pt reports that she cleaned out some flower beds so she is having more pain today. ? ?PERTINENT HISTORY: ?Recent R TKA (10/01/21) with delayed incisional healing, L TKA 02/05/20, ACDF 2009, OA, DM-II, COPD, HTN, CAD, stage 4 CKD, peripheral neuropathy ? ?PAIN:  ?Are you having pain? Yes: NPRS scale: 6-7/10 ?Pain location: R  anterior & posterior shoulder as well as periscapular ?Pain description: constant throbbing ?Aggravating factors: liftng, pulling, overuse esp with overhead activities ?Relieving factors: massage or self-STM with ball on wall, Tylenol, rest, heat or ice ? ?PRECAUTIONS: None ? ?WEIGHT BEARING RESTRICTIONS No ? ?FALLS:  ?Has patient fallen in last 6 months? No ? ?LIVING ENVIRONMENT: ?Lives with:  sister ?Lives in: House/apartment ?Stairs: Yes: Internal: 14 steps; on right going up and External: 4 steps; on right going up, on left going up, and can reach both ?Has following equipment at home: Single point cane, Walker - 2 wheeled, and Wheelchair (manual) ? ?OCCUPATION: ?Retired ? ?PLOF: Independent and Leisure: goes to gym & walking around mall ? ?PATIENT GOALS "To have less pain and more ROM, including lifting more overhead w/o me screaming." ? ?OBJECTIVE:  ? ?DIAGNOSTIC FINDINGS:  ?No recent imaging. Per pt, imaging several years ago revealed RTC tear. ? ?PATIENT SURVEYS:  ?FOTO Shoulder = 59 ? ?COGNITION: ? Overall cognitive status: Within functional limits for tasks assessed ?    ?SENSATION: ?WFL ?Except numbness in R 4th & 5th digits ? ?POSTURE: ?Fwd head and rounded shoulders with protracted scapula R>L ? ?UPPER EXTREMITY ROM:  ? ?Active ROM Right ?12/14/2021 Left ?12/14/2021  ?Shoulder flexion 152 - pain upon lowering 154  ?Shoulder extension 47 67  ?Shoulder abduction 121 - pain upon lowering 146  ?Shoulder adduction    ?Shoulder internal rotation 52 80  ?Shoulder external rotation 48 79  ?Elbow flexion    ?Elbow extension    ?Wrist flexion    ?Wrist extension    ?Wrist ulnar deviation    ?Wrist radial deviation    ?Wrist pronation    ?Wrist supination    ?(Blank rows = not tested) ? ?R shoulder PROM WNL w/o pain except IR mildly limited with painful catch ? ?UPPER EXTREMITY MMT: ? ?MMT Right* ?12/14/2021 Left ?12/14/2021  ?Shoulder flexion 3+ 4-  ?Shoulder extension 4- 4  ?  Shoulder abduction 3+ 4-  ?Shoulder adduction     ?Shoulder internal rotation 4- 4  ?Shoulder external rotation 3 4-  ?Middle trapezius    ?Lower trapezius    ?Elbow flexion    ?Elbow extension    ?Wrist flexion    ?Wrist extension    ?Wrist ulnar deviation    ?Wrist radial deviation    ?Wrist pronation    ?Wrist supination    ?Grip strength (lbs)    ?(Blank rows = not tested) ? ?*Pain with all resisted motions on R except IR & extension ? ?SHOULDER SPECIAL TESTS: ? Impingement tests: Hawkins/Kennedy impingement test: positive  ? Rotator cuff assessment: Empty can test: positive  and Full can test: negative ?  ?JOINT MOBILITY TESTING:  ?R shoulder WFL ? ?PALPATION:  ?Increased muscle tension and TTP in R pecs/anterior shoulder as well as posterior shoulder complex. ?  ?TODAY'S TREATMENT:  ?12/21/21 ?Therapeutic Exercise: ?UBE L1.0 3 min fwd and 3 min back ?3 way pec doorway stretches x 30 sec ?Bil UT/LS stretch in sitting x 30 sec (cueing for proper head movement with levator stretch) ?R standing wall wash into flexion and scaption - noted catching in R shoulder with scaption x 10 ?Standing rows with yellow TB 10x (cues for scap retraction) ?Scapular retraction standing against doorframe 10x ( to increase retraction) ? ?Manual Therapy:  ?STM to R periscap muscles, rhomboids, infraspinatus, teres group ?12/16/21  ?THERAPEUTIC EXERCISES: Instruction in initial HEP: ?- Doorway R Pec Stretch at 60 Degrees Abduction with Arm Straight  - 2-3 x daily - 7 x weekly - 3 reps - 30 sec hold ?- Single Arm Doorway R Pec Stretch at 90 Degrees Abduction  - 2-3 x daily - 7 x weekly - 3 reps - 30 sec hold ?- Single Arm Doorway R Pec Stretch at 120 Degrees Abduction  - 2-3 x daily - 7 x weekly - 3 reps - 30 sec hold ?- Seated Gentle R Upper Trapezius Stretch  - 2-3 x daily - 7 x weekly - 3 reps - 30 sec hold ?- Gentle R Levator Scapulae Stretch (Mirrored)  - 2-3 x daily - 7 x weekly - 3 reps - 30 sec hold ?- Seated B Scapular Retraction  - 2-3 x daily - 7 x weekly - 2 sets - 10 reps  - 5 sec hold ? ? ?PATIENT EDUCATION: ?Education details:  HEP update ?Person educated: Patient ?Education method: Explanation, Demonstration, Verbal cues, and Handouts ?Education comprehension: verbali

## 2021-12-23 ENCOUNTER — Encounter: Payer: Self-pay | Admitting: Physical Therapy

## 2021-12-23 ENCOUNTER — Ambulatory Visit: Payer: Medicare Other | Admitting: Physical Therapy

## 2021-12-23 DIAGNOSIS — M6281 Muscle weakness (generalized): Secondary | ICD-10-CM

## 2021-12-23 DIAGNOSIS — R293 Abnormal posture: Secondary | ICD-10-CM

## 2021-12-23 DIAGNOSIS — M25511 Pain in right shoulder: Secondary | ICD-10-CM | POA: Diagnosis not present

## 2021-12-23 DIAGNOSIS — G8929 Other chronic pain: Secondary | ICD-10-CM

## 2021-12-23 DIAGNOSIS — R29898 Other symptoms and signs involving the musculoskeletal system: Secondary | ICD-10-CM

## 2021-12-23 NOTE — Therapy (Signed)
OUTPATIENT PHYSICAL THERAPY TREATMENT NOTE   Patient [name] MRN: [id number] DOB:Jan 16, 1956, 66 y.o., female Today's Date: 12/23/2021   PT End of Session - 12/23/21 0848     Visit Number 3    Number of Visits 12    Date for PT Re-Evaluation 01/27/22    Authorization Type [employer] MCR    PT Start Time 0848    PT Stop Time 0928    PT Time Calculation (min) 40 min    Activity Tolerance Patient tolerated treatment well    Behavior During Therapy WFL for tasks assessed/performed               Past Medical History:  Diagnosis Date   Acute bronchitis    Anemia    hx of anemia   Anginal pain (HCC)    Arthritis    osteo   Asthma    Chronic kidney disease (CKD), stage IV (severe) (HCC)    Coronary atherosclerosis    Cough    GERD (gastroesophageal reflux disease)    History of colon polyps    History of migraine    Hyperlipidemia    Hypertension    Obstructive sleep apnea (adult) (pediatric)    NO CPAP    Peripheral neuropathy    Type II or unspecified type diabetes mellitus without mention of complication, not stated as uncontrolled    type 2   Unspecified hypothyroidism    Past Surgical History:  Procedure Laterality Date   ANTERIOR FUSION CERVICAL SPINE  2009   CARPAL TUNNEL RELEASE Left    COLONOSCOPY     CORONARY STENT INTERVENTION  2007   LEFT HEART CATHETERIZATION WITH CORONARY ANGIOGRAM N/A 05/29/2014   Procedure: LEFT HEART CATHETERIZATION WITH CORONARY ANGIOGRAM;  Surgeon: Sinclair Grooms, MD;  Location: [facility] CATH LAB;  Service: Cardiovascular;  Laterality: N/A;   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   Procedure: TOTAL KNEE ARTHROPLASTY;  Surgeon: Susa Day, MD;  Location: [facility] ORS;  Service: Orthopedics;  Laterality: Left;  3 hrs   TOTAL KNEE ARTHROPLASTY Right 10/01/2021   Procedure: TOTAL KNEE ARTHROPLASTY;  Surgeon: Susa Day, MD;  Location: [facility];  Service: Orthopedics;  Laterality: Right;   TUBAL LIGATION  1990   WISDOM TOOTH  EXTRACTION     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   Severe persistent asthma 10/05/2020   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   Allergic reaction 04/26/2020   Acute renal failure superimposed on stage 3 chronic kidney disease ([place]) 04/26/2020   Stomatitis 04/26/2020   Allergic reaction caused by a drug 04/26/2020   Rash    Left knee DJD 02/05/2020   Acute bronchitis 08/10/2014   Unstable angina ([place]) 05/29/2014   Abnormal nuclear stress test 05/29/2014   Chest pain 05/06/2014   Exertional shortness of breath 05/06/2014   Mixed hyperlipidemia 05/06/2014   Hyperlipidemia with target LDL less than 70 08/05/2013   HTN (hypertension) 08/05/2013   Chest wall pain 08/05/2013   SINUSITIS, ACUTE 04/07/2009   HYPOTHYROIDISM 12/10/2007   Type 2 diabetes mellitus with hemoglobin A1c goal of less than 7.0% ([place]) 05/28/2007   Obstructive sleep apnea 05/28/2007   Coronary atherosclerosis 05/28/2007   Seasonal and perennial allergic rhinitis 05/28/2007   COUGH, CHRONIC 05/28/2007    PCP: Reynold Bowen, MD  REFERRING PROVIDER: Susa Day, MD  REFERRING DIAG: M25.511 (ICD-10-CM) - Pain in right shoulder  THERAPY DIAG:  Chronic right shoulder pain  Abnormal posture  Muscle weakness (generalized)  Other symptoms and signs involving the musculoskeletal system   ONSET DATE: chronic for ~5 yrs  SUBJECTIVE:                                                                                                                                                                                      SUBJECTIVE STATEMENT: Pt reports HEP going pretty well but scaption wall washes still hurt a little.  PERTINENT HISTORY: Recent R TKA (10/01/21) with delayed incisional healing, L TKA 02/05/20, ACDF 2009, OA, DM-II, COPD, HTN, CAD, stage 4 CKD, peripheral neuropathy  PAIN:  Are you having pain? Yes: NPRS scale:  5/10 Pain location: R  anterior shoulder and upper arm Pain description: "like a misery" Aggravating factors: liftng, pulling, overuse esp with overhead activities Relieving factors: massage or self-STM with ball on wall, Tylenol, rest, heat or ice  PRECAUTIONS: None  WEIGHT BEARING RESTRICTIONS No  FALLS:  Has patient fallen in last 6 months? No  LIVING ENVIRONMENT: Lives with:  sister Lives in: House/apartment Stairs: Yes: Internal: 14 steps; on right going up and External: 4 steps; on right going up, on left going up, and can reach both Has following equipment at home: Single point cane, Walker - 2 wheeled, and Wheelchair (manual)  OCCUPATION: Retired  PLOF: Independent and Leisure: goes to gym & walking around [place] "To have less pain and more ROM, including lifting more overhead w/o me screaming."  OBJECTIVE:   DIAGNOSTIC FINDINGS:  No recent imaging. Per pt, imaging several years ago revealed RTC tear.  PATIENT SURVEYS:  FOTO Shoulder = 59  COGNITION:  Overall cognitive status: Within functional limits for tasks assessed     SENSATION: WFL Except numbness in R 4th & 5th digits  POSTURE: Fwd head and rounded shoulders with protracted scapula R>L  UPPER EXTREMITY ROM:   Active ROM Right 12/14/2021 Left 12/14/2021  Shoulder flexion 152 - pain upon lowering 154  Shoulder extension 47 67  Shoulder abduction 121 - pain upon lowering 146  Shoulder adduction    Shoulder internal rotation 52 80  Shoulder external rotation 48 79  Elbow flexion    Elbow extension    Wrist flexion    Wrist extension    Wrist ulnar deviation    Wrist radial deviation    Wrist pronation    Wrist supination    (Blank rows = not tested)  R shoulder PROM WNL w/o pain except IR mildly limited with painful catch  UPPER EXTREMITY MMT:  MMT Right* 12/14/2021 Left 12/14/2021  Shoulder flexion 3+ 4-  Shoulder extension 4- 4  Shoulder abduction 3+  4-  Shoulder adduction    Shoulder internal  rotation 4- 4  Shoulder external rotation 3 4-  Middle trapezius    Lower trapezius    Elbow flexion    Elbow extension    Wrist flexion    Wrist extension    Wrist ulnar deviation    Wrist radial deviation    Wrist pronation    Wrist supination    Grip strength (lbs)    (Blank rows = not tested)  *Pain with all resisted motions on R except IR & extension  SHOULDER SPECIAL TESTS:  Impingement tests: Hawkins/Kennedy impingement test: positive   Rotator cuff assessment: Empty can test: positive  and Full can test: negative   JOINT MOBILITY TESTING:  R shoulder WFL  PALPATION:  Increased muscle tension and TTP in R pecs/anterior shoulder as well as posterior shoulder complex.   TODAY'S TREATMENT:  12/23/21 THERAPEUTIC EXERCISE: UBE L2.0 x 6 min (3 min each fwd & back) Sidelying R shoulder ER 2 x 10; VC & TC for scap retraction with PT facilitating initial reps Sidelying R shoulder ABD 0-100 2 x 10; VC & TC for humeral head depression with PT facilitating initial reps Standing R shoulder wall wash into flexion and scaption x 10 each (scaption better tolerated today with less pain reported)  MANUAL THERAPY:  STM to R periscap muscles, rhomboids, subscapularis, infraspinatus, teres group, anterolateral deltoids and pecs R scapular mobs all directions - emphasis on scap retraction & depression MWM for R shoulder ER & ABD in sidelying Rock tape to R shoulder for deltoid inhibition and scap retraction to reduce shoulder impingement   12/21/21 THERAPEUTIC EXERCISE: UBE L1.0 3 min fwd and 3 min back 3 way pec doorway stretches x 30 sec Bil UT/LS stretch in sitting x 30 sec (cueing for proper head movement with levator stretch) R standing wall wash into flexion and scaption - noted catching in R shoulder with scaption x 10 Standing rows with yellow TB 10x (cues for scap retraction) Scapular retraction standing against doorframe 10x ( to increase retraction)  MANUAL THERAPY:   STM to R periscap muscles, rhomboids, infraspinatus, teres group  12/16/21  THERAPEUTIC EXERCISES: Instruction in initial HEP: - Doorway R Pec Stretch at 60 Degrees Abduction with Arm Straight  - 2-3 x daily - 7 x weekly - 3 reps - 30 sec hold - Single Arm Doorway R Pec Stretch at 90 Degrees Abduction  - 2-3 x daily - 7 x weekly - 3 reps - 30 sec hold - Single Arm Doorway R Pec Stretch at 120 Degrees Abduction  - 2-3 x daily - 7 x weekly - 3 reps - 30 sec hold - Seated Gentle R Upper Trapezius Stretch  - 2-3 x daily - 7 x weekly - 3 reps - 30 sec hold - Gentle R Levator Scapulae Stretch (Mirrored)  - 2-3 x daily - 7 x weekly - 3 reps - 30 sec hold - Seated B Scapular Retraction  - 2-3 x daily - 7 x weekly - 2 sets - 10 reps - 5 sec hold   PATIENT EDUCATION: Education details:  Ktape wearing and removal instructions Person educated: Patient Education method: Consulting civil engineer, Media planner, Verbal cues, and Handouts Education comprehension: verbalized understanding   HOME EXERCISE PROGRAM: Access Code: [email or web address]  ASSESSMENT:  CLINICAL IMPRESSION: Asa Lente reports HEP going well other than some pain noted with scaption wall slides - she denied need for review of any exercises today. Pain/TTP reported more in R deltoids  today but TTP also identified t/o R shoulder complex - addressed with manual therapy and scapular mobs followed by MWM to promoted improved Hilda movement coordination. Pt reporting decreased pain with scaption wall slides following manual therapy. Session concluded with trial of kinesiotaping to promote further reduction in abnormal muscle tension as well as improve shoulder and scapular alignment.  OBJECTIVE IMPAIRMENTS decreased activity tolerance, decreased knowledge of condition, decreased ROM, decreased strength, increased fascial restrictions, impaired perceived functional ability, increased muscle spasms, impaired flexibility, impaired UE functional use, improper body mechanics,  postural dysfunction, and pain.   ACTIVITY LIMITATIONS cleaning, community activity, driving, meal prep, laundry, yard work, shopping, and church.   PERSONAL FACTORS Age, Past/current experiences, Time since onset of injury/illness/exacerbation, and 3+ comorbidities: Recent R TKA (10/01/21) with delayed incisional healing, L TKA 02/05/20, ACDF 2009, OA, DM-II, COPD, HTN, CAD, stage 4 CKD, peripheral neuropathy are also affecting patient's functional outcome.    REHAB POTENTIAL: Good  CLINICAL DECISION MAKING: Stable/uncomplicated  EVALUATION COMPLEXITY: Low   GOALS: Goals reviewed with patient? Yes  SHORT TERM GOALS: Target date: 01/06/2022    Patient will be independent with initial HEP Baseline: Goal status: IN PROGRESS  2.  Improve posture and alignment with patient to demonstrate improved upright posture with posterior shoulder girdle engaged Baseline:  Goal status: IN PROGRESS  LONG TERM GOALS: Target date: 01/27/2022    Patient will be independent with ongoing/advanced HEP for self-management at home Baseline:  Goal status: IN PROGRESS  2.  Patient to demonstrate appropriate posture and body mechanics needed for daily activities Baseline:  Goal status: IN PROGRESS  3.  Decrease pain in the R shoulder girdle by 50-75% allowing patient to use R UE for functional activities Baseline:  Goal status: IN PROGRESS  4.  Patient to improve R shoulder AROM to [facility] without pain provocation Baseline:  Goal status: IN PROGRESS  5.  Patient will demonstrate improved R shoulder strength to >/= 4 to 4+/5 for functional UE use Baseline:  Goal status: IN PROGRESS  6.  Patient to report ability to perform ADLs, household, and leisure activities without limitation due to R shoulder pain, LOM or weakness Baseline:  Goal status: IN PROGRESS  7.  Patient will improve shoulder FOTO to >/= 65 to demonstrate improving function Baseline: 59 Goal status: IN PROGRESS   PLAN: PT  FREQUENCY: 2x/week  PT DURATION: 6 weeks  PLANNED INTERVENTIONS: Therapeutic exercises, Therapeutic activity, Neuromuscular re-education, Balance training, Gait training, Patient/Family education, Joint mobilization, Dry Needling, Electrical stimulation, Cryotherapy, Moist heat, Taping, Vasopneumatic device, Ultrasound, Ionotophoresis 4mg /ml Dexamethasone, and Manual therapy  PLAN FOR NEXT SESSION: assess response to Ktape; progress postural strengthening; gentle R shoulder ROM & stretching; MT and modalities to address abnormal muscle tension PRN    Percival Spanish, PT 12/23/2021, 1:15 PM

## 2021-12-28 ENCOUNTER — Ambulatory Visit: Payer: Medicare Other | Admitting: Physical Therapy

## 2021-12-28 ENCOUNTER — Encounter: Payer: Self-pay | Admitting: Physical Therapy

## 2021-12-28 DIAGNOSIS — M6281 Muscle weakness (generalized): Secondary | ICD-10-CM

## 2021-12-28 DIAGNOSIS — R293 Abnormal posture: Secondary | ICD-10-CM

## 2021-12-28 DIAGNOSIS — R29898 Other symptoms and signs involving the musculoskeletal system: Secondary | ICD-10-CM

## 2021-12-28 DIAGNOSIS — G8929 Other chronic pain: Secondary | ICD-10-CM

## 2021-12-28 DIAGNOSIS — M25661 Stiffness of right knee, not elsewhere classified: Secondary | ICD-10-CM | POA: Diagnosis not present

## 2021-12-28 NOTE — Therapy (Signed)
OUTPATIENT PHYSICAL THERAPY TREATMENT NOTE   Patient [name] MRN: [id number] DOB:1955-09-06, 66 y.o., female Today's Date: 12/28/2021   PT End of Session - 12/28/21 1450     Visit Number 4    Number of Visits 12    Date for PT Re-Evaluation 01/27/22    Authorization Type [employer] MCR    PT Start Time 1450    PT Stop Time 1534    PT Time Calculation (min) 44 min    Activity Tolerance Patient tolerated treatment well    Behavior During Therapy WFL for tasks assessed/performed                Past Medical History:  Diagnosis Date   Acute bronchitis    Anemia    hx of anemia   Anginal pain (HCC)    Arthritis    osteo   Asthma    Chronic kidney disease (CKD), stage IV (severe) (HCC)    Coronary atherosclerosis    Cough    GERD (gastroesophageal reflux disease)    History of colon polyps    History of migraine    Hyperlipidemia    Hypertension    Obstructive sleep apnea (adult) (pediatric)    NO CPAP    Peripheral neuropathy    Type II or unspecified type diabetes mellitus without mention of complication, not stated as uncontrolled    type 2   Unspecified hypothyroidism    Past Surgical History:  Procedure Laterality Date   ANTERIOR FUSION CERVICAL SPINE  2009   CARPAL TUNNEL RELEASE Left    COLONOSCOPY     CORONARY STENT INTERVENTION  2007   LEFT HEART CATHETERIZATION WITH CORONARY ANGIOGRAM N/A 05/29/2014   Procedure: LEFT HEART CATHETERIZATION WITH CORONARY ANGIOGRAM;  Surgeon: Sinclair Grooms, MD;  Location: [facility] CATH LAB;  Service: Cardiovascular;  Laterality: N/A;   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   Procedure: TOTAL KNEE ARTHROPLASTY;  Surgeon: Susa Day, MD;  Location: [facility] ORS;  Service: Orthopedics;  Laterality: Left;  3 hrs   TOTAL KNEE ARTHROPLASTY Right 10/01/2021   Procedure: TOTAL KNEE ARTHROPLASTY;  Surgeon: Susa Day, MD;  Location: [facility];  Service: Orthopedics;  Laterality: Right;   TUBAL LIGATION  1990   WISDOM  TOOTH EXTRACTION     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   Severe persistent asthma 10/05/2020   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   Allergic reaction 04/26/2020   Acute renal failure superimposed on stage 3 chronic kidney disease ([place]) 04/26/2020   Stomatitis 04/26/2020   Allergic reaction caused by a drug 04/26/2020   Rash    Left knee DJD 02/05/2020   Acute bronchitis 08/10/2014   Unstable angina ([place]) 05/29/2014   Abnormal nuclear stress test 05/29/2014   Chest pain 05/06/2014   Exertional shortness of breath 05/06/2014   Mixed hyperlipidemia 05/06/2014   Hyperlipidemia with target LDL less than 70 08/05/2013   HTN (hypertension) 08/05/2013   Chest wall pain 08/05/2013   SINUSITIS, ACUTE 04/07/2009   HYPOTHYROIDISM 12/10/2007   Type 2 diabetes mellitus with hemoglobin A1c goal of less than 7.0% ([place]) 05/28/2007   Obstructive sleep apnea 05/28/2007   Coronary atherosclerosis 05/28/2007   Seasonal and perennial allergic rhinitis 05/28/2007   COUGH, CHRONIC 05/28/2007    PCP: Reynold Bowen, MD  REFERRING PROVIDER: Susa Day, MD  REFERRING DIAG: M25.511 (ICD-10-CM) - Pain in right shoulder  THERAPY DIAG:  Chronic right shoulder  pain  Abnormal posture  Muscle weakness (generalized)  Other symptoms and signs involving the musculoskeletal system  RATIONALE FOR EVALUATION AND TREATMENT:  Rehabilitation  ONSET DATE: chronic for ~5 yrs  SUBJECTIVE:                                                                                                                                                                                      SUBJECTIVE STATEMENT: Pt notes some muscle soreness from the exercises and still has pain with reaching overhead but otherwise denies pain today. She feels that the Ktape seemed to help.  PERTINENT HISTORY: Recent R TKA (10/01/21) with delayed incisional healing, L TKA 02/05/20, ACDF  2009, OA, DM-II, COPD, HTN, CAD, stage 4 CKD, peripheral neuropathy  PAIN:  Are you having pain? No  PRECAUTIONS: None  WEIGHT BEARING RESTRICTIONS No  FALLS:  Has patient fallen in last 6 months? No  LIVING ENVIRONMENT: Lives with:  sister Lives in: House/apartment Stairs: Yes: Internal: 14 steps; on right going up and External: 4 steps; on right going up, on left going up, and can reach both Has following equipment at home: Single point cane, Walker - 2 wheeled, and Wheelchair (manual)  OCCUPATION: Retired  PLOF: Independent and Leisure: goes to gym & walking around [place] "To have less pain and more ROM, including lifting more overhead w/o me screaming."  OBJECTIVE:   DIAGNOSTIC FINDINGS:  No recent imaging. Per pt, imaging several years ago revealed RTC tear.  PATIENT SURVEYS:  FOTO Shoulder = 59  COGNITION:  Overall cognitive status: Within functional limits for tasks assessed     SENSATION: WFL Except numbness in R 4th & 5th digits  POSTURE: Fwd head and rounded shoulders with protracted scapula R>L  UPPER EXTREMITY ROM:   Active ROM Right 12/14/2021 Left 12/14/2021  Shoulder flexion 152 - pain upon lowering 154  Shoulder extension 47 67  Shoulder abduction 121 - pain upon lowering 146  Shoulder adduction    Shoulder internal rotation 52 80  Shoulder external rotation 48 79  Elbow flexion    Elbow extension    Wrist flexion    Wrist extension    Wrist ulnar deviation    Wrist radial deviation    Wrist pronation    Wrist supination    (Blank rows = not tested)  R shoulder PROM WNL w/o pain except IR mildly limited with painful catch  UPPER EXTREMITY MMT:  MMT Right* 12/14/2021 Left 12/14/2021  Shoulder flexion 3+ 4-  Shoulder extension 4- 4  Shoulder abduction 3+ 4-  Shoulder adduction    Shoulder internal rotation 4- 4  Shoulder external rotation 3  4-  Middle trapezius    Lower trapezius    Elbow flexion    Elbow extension     Wrist flexion    Wrist extension    Wrist ulnar deviation    Wrist radial deviation    Wrist pronation    Wrist supination    Grip strength (lbs)    (Blank rows = not tested)  *Pain with all resisted motions on R except IR & extension  SHOULDER SPECIAL TESTS:  Impingement tests: Hawkins/Kennedy impingement test: positive   Rotator cuff assessment: Empty can test: positive  and Full can test: negative   JOINT MOBILITY TESTING:  R shoulder WFL  PALPATION:  Increased muscle tension and TTP in R pecs/anterior shoulder as well as posterior shoulder complex.   TODAY'S TREATMENT:  12/28/21 THERAPEUTIC EXERCISE: to improve strength and mobility.  Verbal and tactile cues throughout for technique. UBE L2.0 x 6 min (3 min each fwd & back) Standing red TB B row (cues for scap retraction & depression) 10 x 5", 2 sets Standing red TB B straight arm scap retraction + mini shoulder extension 10 x 5", 2 sets Orange Pball flexion roll-up on wall with pause for stretch at top pf motion x 10 - cues to avoid stretching into pain (scaption attempted but deferred d/t increased pain) Small green ball on wall - R shoulder circles CW/CCW at 90 flexion x 10 - pt noting increased pain in posterior shoulder with CW motion R shoulder submax isometrics at wall (flexion, ABD, extension, IR, ER) 10 x 5" each  MANUAL THERAPY:  Rock tape to R shoulder for deltoid inhibition and scap retraction to reduce shoulder impingement STM to R posterior deltoid   12/23/21 THERAPEUTIC EXERCISE: UBE L2.0 x 6 min (3 min each fwd & back) Sidelying R shoulder ER 2 x 10; VC & TC for scap retraction with PT facilitating initial reps Sidelying R shoulder ABD 0-100 2 x 10; VC & TC for humeral head depression with PT facilitating initial reps Standing R shoulder wall wash into flexion and scaption x 10 each (scaption better tolerated today with less pain reported)  MANUAL THERAPY:  STM to R periscap muscles, rhomboids,  subscapularis, infraspinatus, teres group, anterolateral deltoids and pecs R scapular mobs all directions - emphasis on scap retraction & depression MWM for R shoulder ER & ABD in sidelying Rock tape to R shoulder for deltoid inhibition and scap retraction to reduce shoulder impingement   12/21/21 THERAPEUTIC EXERCISE: UBE L1.0 3 min fwd and 3 min back 3 way pec doorway stretches x 30 sec Bil UT/LS stretch in sitting x 30 sec (cueing for proper head movement with levator stretch) R standing wall wash into flexion and scaption - noted catching in R shoulder with scaption x 10 Standing rows with yellow TB 10x (cues for scap retraction) Scapular retraction standing against doorframe 10x ( to increase retraction)  MANUAL THERAPY:  STM to R periscap muscles, rhomboids, infraspinatus, teres group   PATIENT EDUCATION: Education details:  Ktape wearing and removal instructions Person educated: Patient Education method: Consulting civil engineer, Media planner, Verbal cues, and Handouts Education comprehension: verbalized understanding   HOME EXERCISE PROGRAM: Access Code: [email or web address]  Exercises - Systems analyst at 60 Degrees Abduction with Arm Straight  - 2-3 x daily - 7 x weekly - 3 reps - 30 sec hold - Single Arm Doorway Pec Stretch at 90 Degrees Abduction  - 2-3 x daily - 7 x weekly - 3 reps - 30 sec hold -  Single Arm Doorway Pec Stretch at 120 Degrees Abduction  - 2-3 x daily - 7 x weekly - 3 reps - 30 sec hold - Seated Gentle Upper Trapezius Stretch  - 2-3 x daily - 7 x weekly - 3 reps - 30 sec hold - Gentle Levator Scapulae Stretch (Mirrored)  - 2-3 x daily - 7 x weekly - 3 reps - 30 sec hold - Seated Scapular Retraction  - 2-3 x daily - 7 x weekly - 2 sets - 10 reps - 5 sec hold - Standing Bilateral Low Shoulder Row with Anchored Resistance  - 1 x daily - 7 x weekly - 2 sets - 10 reps (red TB) - Shoulder Flexion Wall Slide with Towel  - 1 x daily - 7 x weekly - 2 sets - 10  reps   ASSESSMENT:  CLINICAL IMPRESSION: Asa Lente notes benefit from the [place] last visit and requested reapplication today. No pain reported at rest, but still limited by pain with overhead motions, esp in scaption. Advanced resistance with rows to red TB with good tolerance and introduced straight arm retraction/mini shoulder extension but increased cues necessary to avoid rounding shoulders forward. Introduced submax isometrics with no increased pain reported. Asa Lente will continue to benefit from skilled PT to restore pain free functional R shoulder ROM and strength.  OBJECTIVE IMPAIRMENTS decreased activity tolerance, decreased knowledge of condition, decreased ROM, decreased strength, increased fascial restrictions, impaired perceived functional ability, increased muscle spasms, impaired flexibility, impaired UE functional use, improper body mechanics, postural dysfunction, and pain.   ACTIVITY LIMITATIONS cleaning, community activity, driving, meal prep, laundry, yard work, shopping, and church.   PERSONAL FACTORS Age, Past/current experiences, Time since onset of injury/illness/exacerbation, and 3+ comorbidities: Recent R TKA (10/01/21) with delayed incisional healing, L TKA 02/05/20, ACDF 2009, OA, DM-II, COPD, HTN, CAD, stage 4 CKD, peripheral neuropathy are also affecting patient's functional outcome.    REHAB POTENTIAL: Good  CLINICAL DECISION MAKING: Stable/uncomplicated  EVALUATION COMPLEXITY: Low   GOALS: Goals reviewed with patient? Yes  SHORT TERM GOALS: Target date: 01/06/2022    Patient will be independent with initial HEP Goal status: IN PROGRESS  2.  Improve posture and alignment with patient to demonstrate improved upright posture with posterior shoulder girdle engaged Goal status: IN PROGRESS  LONG TERM GOALS: Target date: 01/27/2022    Patient will be independent with ongoing/advanced HEP for self-management at home Goal status: IN PROGRESS  2.  Patient to  demonstrate appropriate posture and body mechanics needed for daily activities Goal status: IN PROGRESS  3.  Decrease pain in the R shoulder girdle by 50-75% allowing patient to use R UE for functional activities Goal status: IN PROGRESS  4.  Patient to improve R shoulder AROM to [facility] without pain provocation Goal status: IN PROGRESS  5.  Patient will demonstrate improved R shoulder strength to >/= 4 to 4+/5 for functional UE use Goal status: IN PROGRESS  6.  Patient to report ability to perform ADLs, household, and leisure activities without limitation due to R shoulder pain, LOM or weakness Goal status: IN PROGRESS  7.  Patient will improve shoulder FOTO to >/= 65 to demonstrate improving function Baseline: 59 Goal status: IN PROGRESS   PLAN: PT FREQUENCY: 2x/week  PT DURATION: 6 weeks  PLANNED INTERVENTIONS: Therapeutic exercises, Therapeutic activity, Neuromuscular re-education, Balance training, Gait training, Patient/Family education, Joint mobilization, Dry Needling, Electrical stimulation, Cryotherapy, Moist heat, Taping, Vasopneumatic device, Ultrasound, Ionotophoresis 4mg /ml Dexamethasone, and Manual therapy  PLAN FOR NEXT  SESSION: progress postural strengthening; gentle R shoulder ROM & stretching; MT and modalities to address abnormal muscle tension PRN    Percival Spanish, PT 12/28/2021, 9:10 PM

## 2021-12-30 ENCOUNTER — Encounter: Payer: Self-pay | Admitting: Physical Therapy

## 2021-12-30 ENCOUNTER — Ambulatory Visit: Payer: Medicare Other | Admitting: Physical Therapy

## 2021-12-30 DIAGNOSIS — R29898 Other symptoms and signs involving the musculoskeletal system: Secondary | ICD-10-CM

## 2021-12-30 DIAGNOSIS — R293 Abnormal posture: Secondary | ICD-10-CM

## 2021-12-30 DIAGNOSIS — G8929 Other chronic pain: Secondary | ICD-10-CM

## 2021-12-30 DIAGNOSIS — M25661 Stiffness of right knee, not elsewhere classified: Secondary | ICD-10-CM | POA: Diagnosis not present

## 2021-12-30 DIAGNOSIS — M6281 Muscle weakness (generalized): Secondary | ICD-10-CM

## 2021-12-30 NOTE — Therapy (Signed)
OUTPATIENT PHYSICAL THERAPY TREATMENT NOTE   Patient Name: ZANYIAH POSTEN MRN: [id number] DOB:05-Nov-1955, 66 y.o., female Today's Date: 12/30/2021   PT End of Session - 12/30/21 1450     Visit Number 5    Number of Visits 12    Date for PT Re-Evaluation 01/27/22    Authorization Type [employer] MCR    PT Start Time 1450   Pt arrived late   PT Stop Time 0923    PT Time Calculation (min) 40 min    Activity Tolerance Patient tolerated treatment well    Behavior During Therapy WFL for tasks assessed/performed                 Past Medical History:  Diagnosis Date   Acute bronchitis    Anemia    hx of anemia   Anginal pain (HCC)    Arthritis    osteo   Asthma    Chronic kidney disease (CKD), stage IV (severe) (HCC)    Coronary atherosclerosis    Cough    GERD (gastroesophageal reflux disease)    History of colon polyps    History of migraine    Hyperlipidemia    Hypertension    Obstructive sleep apnea (adult) (pediatric)    NO CPAP    Peripheral neuropathy    Type II or unspecified type diabetes mellitus without mention of complication, not stated as uncontrolled    type 2   Unspecified hypothyroidism    Past Surgical History:  Procedure Laterality Date   ANTERIOR FUSION CERVICAL SPINE  2009   CARPAL TUNNEL RELEASE Left    COLONOSCOPY     CORONARY STENT INTERVENTION  2007   LEFT HEART CATHETERIZATION WITH CORONARY ANGIOGRAM N/A 05/29/2014   Procedure: LEFT HEART CATHETERIZATION WITH CORONARY ANGIOGRAM;  Surgeon: Sinclair Grooms, MD;  Location[facility] C[facility];  Service: Cardiovascular;  Laterality: N/A;   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   Procedure: TOTAL KNEE ARTHROPLASTY;  Surgeon: Susa Day, MD;  Location: [facility]S;  Service: Orthopedics;  Laterality: Left;  3 hrs   TOTAL KNEE ARTHROPLASTY Right 10/01/2021   Procedure: TOTAL KNEE ARTHROPLASTY;  Surgeon: Susa Day, MD;  Location: [facility];  Service: Orthopedics;  Laterality: Right;   TUBAL LIGATION   1990   WISDOM TOOTH EXTRACTION     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   Severe persistent asthma 10/05/2020   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/27/2020   Allergic reaction 04/26/2020   Acute renal failure superimposed on stage 3 chronic kidney disease ([place]) 04/26/2020   Stomatitis 04/26/2020   Allergic reaction caused by a drug 04/26/2020   Rash    Left knee DJD 02/05/2020   Acute bronchitis 08/10/2014   Unstable angina ([place]) 05/29/2014   Abnormal nuclear stress test 05/29/2014   Chest pain 05/06/2014   Exertional shortness of breath 05/06/2014   Mixed hyperlipidemia 05/06/2014   Hyperlipidemia with target LDL less than 70 08/05/2013   HTN (hypertension) 08/05/2013   Chest wall pain 08/05/2013   SINUSITIS, ACUTE 04/07/2009   HYPOTHYROIDISM 12/10/2007   Type 2 diabetes mellitus with hemoglobin A1c goal of less than 7.0% ([place]) 05/28/2007   Obstructive sleep apnea 05/28/2007   Coronary atherosclerosis 05/28/2007   Seasonal and perennial allergic rhinitis 05/28/2007   COUGH, CHRONIC 05/28/2007    PCP: Reynold Bowen, MD  REFERRING PROVIDER: Susa Day, MD  REFERRING DIAG: M25.511 (ICD-10-CM) - Pain in right shoulder  THERAPY  DIAG:  Chronic right shoulder pain  Abnormal posture  Muscle weakness (generalized)  Other symptoms and signs involving the musculoskeletal system  RATIONALE FOR EVALUATION AND TREATMENT:  Rehabilitation  ONSET DATE: chronic for ~5 yrs  SUBJECTIVE:                                                                                                                                                                                      SUBJECTIVE STATEMENT: Pt reports improving ability to lift/hold heavier items like a cast iron skillet in her R hand. She notes TTP/soreness in anterior and posterior shoulder today.  PAIN:  Are you having pain? Yes: NPRS scale: 5-6/10 Pain location: R anterior  shoulder (pecs) and inferior posterior shoulder Pain description: "misery"  PERTINENT HISTORY: Recent R TKA (10/01/21) with delayed incisional healing, L TKA 02/05/20, ACDF 2009, OA, DM-II, COPD, HTN, CAD, stage 4 CKD, peripheral neuropathy  PRECAUTIONS: None  WEIGHT BEARING RESTRICTIONS No  FALLS:  Has patient fallen in last 6 months? No  LIVING ENVIRONMENT: Lives with:  sister Lives in: House/apartment Stairs: Yes: Internal: 14 steps; on right going up and External: 4 steps; on right going up, on left going up, and can reach both Has following equipment at home: Single point cane, Walker - 2 wheeled, and Wheelchair (manual)  OCCUPATION: Retired  PLOF: Independent and Leisure: goes to gym & walking around [place] "To have less pain and more ROM, including lifting more overhead w/o me screaming."  OBJECTIVE:   DIAGNOSTIC FINDINGS:  No recent imaging. Per pt, imaging several years ago revealed RTC tear.  PATIENT SURVEYS:  FOTO Shoulder = 59  COGNITION:  Overall cognitive status: Within functional limits for tasks assessed     SENSATION: WFL Except numbness in R 4th & 5th digits  POSTURE: Fwd head and rounded shoulders with protracted scapula R>L  UPPER EXTREMITY ROM:   Active ROM Right 12/14/2021 Left 12/14/2021  Shoulder flexion 152 - pain upon lowering 154  Shoulder extension 47 67  Shoulder abduction 121 - pain upon lowering 146  Shoulder adduction    Shoulder internal rotation 52 80  Shoulder external rotation 48 79  Elbow flexion    Elbow extension    Wrist flexion    Wrist extension    Wrist ulnar deviation    Wrist radial deviation    Wrist pronation    Wrist supination    (Blank rows = not tested)  R shoulder PROM WNL w/o pain except IR mildly limited with painful catch  UPPER EXTREMITY MMT:  MMT Right* 12/14/2021 Left 12/14/2021  Shoulder flexion 3+ 4-  Shoulder extension 4- 4  Shoulder  abduction 3+ 4-  Shoulder adduction     Shoulder internal rotation 4- 4  Shoulder external rotation 3 4-  Middle trapezius    Lower trapezius    Elbow flexion    Elbow extension    Wrist flexion    Wrist extension    Wrist ulnar deviation    Wrist radial deviation    Wrist pronation    Wrist supination    Grip strength (lbs)    (Blank rows = not tested)  *Pain with all resisted motions on R except IR & extension  SHOULDER SPECIAL TESTS:  Impingement tests: Hawkins/Kennedy impingement test: positive   Rotator cuff assessment: Empty can test: positive  and Full can test: negative   JOINT MOBILITY TESTING:  R shoulder WFL  PALPATION:  Increased muscle tension and TTP in R pecs/anterior shoulder as well as posterior shoulder complex.   TODAY'S TREATMENT:  12/30/21 THERAPEUTIC EXERCISE: to improve strength and mobility.  Verbal and tactile cues throughout for technique. UBE L2.5 x 6 min (3 min each fwd & back) Sidelying R shoulder ABD 0-90 x 10 Sidelying R shoulder CW/CC circles at 90 ABD x 10 each direction Attempted R sidelying scap retraction + shoulder ER but deferred d/t increased pain R "bow & arrow" pec stretch in L sidelying 10 x 5"  MANUAL THERAPY: STM to R periscap muscles, rhomboids, subscapularis, infraspinatus, teres group, lats and pecs R scapular mobs all directions - emphasis on scap retraction & depression MWM for R shoulder ER & ABD in sidelying   12/28/21 THERAPEUTIC EXERCISE: to improve strength and mobility.  Verbal and tactile cues throughout for technique. UBE L2.0 x 6 min (3 min each fwd & back) Standing red TB B row (cues for scap retraction & depression) 10 x 5", 2 sets Standing red TB B straight arm scap retraction + mini shoulder extension 10 x 5", 2 sets Orange Pball flexion roll-up on wall with pause for stretch at top pf motion x 10 - cues to avoid stretching into pain (scaption attempted but deferred d/t increased pain) Small green ball on wall - R shoulder circles CW/CCW at 90  flexion x 10 - pt noting increased pain in posterior shoulder with CW motion R shoulder submax isometrics at wall (flexion, ABD, extension, IR, ER) 10 x 5" each  MANUAL THERAPY:  Rock tape to R shoulder for deltoid inhibition and scap retraction to reduce shoulder impingement STM to R posterior deltoid   12/23/21 THERAPEUTIC EXERCISE: UBE L2.0 x 6 min (3 min each fwd & back) Sidelying R shoulder ER 2 x 10; VC & TC for scap retraction with PT facilitating initial reps Sidelying R shoulder ABD 0-100 2 x 10; VC & TC for humeral head depression with PT facilitating initial reps Standing R shoulder wall wash into flexion and scaption x 10 each (scaption better tolerated today with less pain reported)  MANUAL THERAPY:  STM to R periscap muscles, rhomboids, subscapularis, infraspinatus, teres group, anterolateral deltoids and pecs R scapular mobs all directions - emphasis on scap retraction & depression MWM for R shoulder ER & ABD in sidelying Rock tape to R shoulder for deltoid inhibition and scap retraction to reduce shoulder impingement   PATIENT EDUCATION:    HOME EXERCISE PROGRAM: Access Code: [email or web address]  Exercises - Systems analyst at 60 Degrees Abduction with Arm Straight  - 2-3 x daily - 7 x weekly - 3 reps - 30 sec hold - Single Arm Doorway Pec Stretch at 90 Degrees  Abduction  - 2-3 x daily - 7 x weekly - 3 reps - 30 sec hold - Single Arm Doorway Pec Stretch at 120 Degrees Abduction  - 2-3 x daily - 7 x weekly - 3 reps - 30 sec hold - Seated Gentle Upper Trapezius Stretch  - 2-3 x daily - 7 x weekly - 3 reps - 30 sec hold - Gentle Levator Scapulae Stretch (Mirrored)  - 2-3 x daily - 7 x weekly - 3 reps - 30 sec hold - Seated Scapular Retraction  - 2-3 x daily - 7 x weekly - 2 sets - 10 reps - 5 sec hold - Standing Bilateral Low Shoulder Row with Anchored Resistance  - 1 x daily - 7 x weekly - 2 sets - 10 reps (red TB) - Shoulder Flexion Wall Slide with Towel  - 1 x daily - 7 x  weekly - 2 sets - 10 reps   ASSESSMENT:  CLINICAL IMPRESSION: Asa Lente continues to note benefit from [place] with tape still in place today. She reports improving functional use of R UE with ability to assist with lifting a cast iron skillet but does still note some TTP/soreness in pecs and posterior inferior R shoulder today. Palpation revealing multiple TPs and taut tender bands in R pec major/minor and teres group/lats as well as periscapular muscles including rhomboids, subscapularis and infraspinatus, all of which were address with manual STM and TPR with pt reporting decreased pain and TTP following MT. Worked on ROM in pain free ranges today focusing on slow control and resting when losing control with fatigue. Bow and arrow pec stretch introduced to help further reduce pec tightness and promote more neutral shoulder alignment.   OBJECTIVE IMPAIRMENTS decreased activity tolerance, decreased knowledge of condition, decreased ROM, decreased strength, increased fascial restrictions, impaired perceived functional ability, increased muscle spasms, impaired flexibility, impaired UE functional use, improper body mechanics, postural dysfunction, and pain.   ACTIVITY LIMITATIONS cleaning, community activity, driving, meal prep, laundry, yard work, shopping, and church.   PERSONAL FACTORS Age, Past/current experiences, Time since onset of injury/illness/exacerbation, and 3+ comorbidities: Recent R TKA (10/01/21) with delayed incisional healing, L TKA 02/05/20, ACDF 2009, OA, DM-II, COPD, HTN, CAD, stage 4 CKD, peripheral neuropathy are also affecting patient's functional outcome.    REHAB POTENTIAL: Good  CLINICAL DECISION MAKING: Stable/uncomplicated  EVALUATION COMPLEXITY: Low   GOALS: Goals reviewed with patient? Yes  SHORT TERM GOALS: Target date: 01/06/2022    Patient will be independent with initial HEP Goal status: IN PROGRESS  2.  Improve posture and alignment with patient to demonstrate  improved upright posture with posterior shoulder girdle engaged Goal status: IN PROGRESS  LONG TERM GOALS: Target date: 01/27/2022    Patient will be independent with ongoing/advanced HEP for self-management at home Goal status: IN PROGRESS  2.  Patient to demonstrate appropriate posture and body mechanics needed for daily activities Goal status: IN PROGRESS  3.  Decrease pain in the R shoulder girdle by 50-75% allowing patient to use R UE for functional activities Goal status: IN PROGRESS  4.  Patient to improve R shoulder AROM to [facility] without pain provocation Goal status: IN PROGRESS  5.  Patient will demonstrate improved R shoulder strength to >/= 4 to 4+/5 for functional UE use Goal status: IN PROGRESS  6.  Patient to report ability to perform ADLs, household, and leisure activities without limitation due to R shoulder pain, LOM or weakness Goal status: IN PROGRESS  7.  Patient will  improve shoulder FOTO to >/= 65 to demonstrate improving function Baseline: 59 Goal status: IN PROGRESS   PLAN: PT FREQUENCY: 2x/week  PT DURATION: 6 weeks  PLANNED INTERVENTIONS: Therapeutic exercises, Therapeutic activity, Neuromuscular re-education, Balance training, Gait training, Patient/Family education, Joint mobilization, Dry Needling, Electrical stimulation, Cryotherapy, Moist heat, Taping, Vasopneumatic device, Ultrasound, Ionotophoresis 4mg /ml Dexamethasone, and Manual therapy  PLAN FOR NEXT SESSION: progress postural strengthening; gentle R shoulder ROM & stretching; isometric RTC strengthening progressing to light TB resistance as pain allows, MT and modalities to address abnormal muscle tension PRN    Percival Spanish, PT 12/30/2021, 8:36 PM

## 2022-01-05 ENCOUNTER — Ambulatory Visit: Payer: Medicare Other

## 2022-01-05 DIAGNOSIS — M6281 Muscle weakness (generalized): Secondary | ICD-10-CM

## 2022-01-05 DIAGNOSIS — R293 Abnormal posture: Secondary | ICD-10-CM

## 2022-01-05 DIAGNOSIS — M25511 Pain in right shoulder: Secondary | ICD-10-CM | POA: Diagnosis not present

## 2022-01-05 DIAGNOSIS — R29898 Other symptoms and signs involving the musculoskeletal system: Secondary | ICD-10-CM

## 2022-01-05 DIAGNOSIS — G8929 Other chronic pain: Secondary | ICD-10-CM

## 2022-01-05 NOTE — Therapy (Signed)
OUTPATIENT PHYSICAL THERAPY TREATMENT NOTE   Patient [name] MRN: [id number] DOB:1955-11-12, 66 y.o., female Today's Date: 01/05/2022   PT End of Session - 01/05/22 1359     Visit Number 6    Number of Visits 12    Date for PT Re-Evaluation 01/27/22    Authorization Type UHC MCR    PT Start Time 2330    PT Stop Time 0762    PT Time Calculation (min) 42 min    Activity Tolerance Patient tolerated treatment well    Behavior During Therapy WFL for tasks assessed/performed                  Past Medical History:  Diagnosis Date   Acute bronchitis    Anemia    hx of anemia   Anginal pain (HCC)    Arthritis    osteo   Asthma    Chronic kidney disease (CKD), stage IV (severe) (HCC)    Coronary atherosclerosis    Cough    GERD (gastroesophageal reflux disease)    History of colon polyps    History of migraine    Hyperlipidemia    Hypertension    Obstructive sleep apnea (adult) (pediatric)    NO CPAP    Peripheral neuropathy    Type II or unspecified type diabetes mellitus without mention of complication, not stated as uncontrolled    type 2   Unspecified hypothyroidism    Past Surgical History:  Procedure Laterality Date   ANTERIOR FUSION CERVICAL SPINE  2009   CARPAL TUNNEL RELEASE Left    COLONOSCOPY     CORONARY STENT INTERVENTION  2007   LEFT HEART CATHETERIZATION WITH CORONARY ANGIOGRAM N/A 05/29/2014   Procedure: LEFT HEART CATHETERIZATION WITH CORONARY ANGIOGRAM;  Surgeon: Sinclair Grooms, MD;  Location: [facility] CATH LAB;  Service: Cardiovascular;  Laterality: N/A;   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   Procedure: TOTAL KNEE ARTHROPLASTY;  Surgeon: Susa Day, MD;  Location: [facility] ORS;  Service: Orthopedics;  Laterality: Left;  3 hrs   TOTAL KNEE ARTHROPLASTY Right 10/01/2021   Procedure: TOTAL KNEE ARTHROPLASTY;  Surgeon: Susa Day, MD;  Location: [facility];  Service: Orthopedics;  Laterality: Right;   TUBAL LIGATION  1990   WISDOM  TOOTH EXTRACTION     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   Severe persistent asthma 10/05/2020   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   Allergic reaction 04/26/2020   Acute renal failure superimposed on stage 3 chronic kidney disease ([place]) 04/26/2020   Stomatitis 04/26/2020   Allergic reaction caused by a drug 04/26/2020   Rash    Left knee DJD 02/05/2020   Acute bronchitis 08/10/2014   Unstable angina ([place]) 05/29/2014   Abnormal nuclear stress test 05/29/2014   Chest pain 05/06/2014   Exertional shortness of breath 05/06/2014   Mixed hyperlipidemia 05/06/2014   Hyperlipidemia with target LDL less than 70 08/05/2013   HTN (hypertension) 08/05/2013   Chest wall pain 08/05/2013   SINUSITIS, ACUTE 04/07/2009   HYPOTHYROIDISM 12/10/2007   Type 2 diabetes mellitus with hemoglobin A1c goal of less than 7.0% ([place]) 05/28/2007   Obstructive sleep apnea 05/28/2007   Coronary atherosclerosis 05/28/2007   Seasonal and perennial allergic rhinitis 05/28/2007   COUGH, CHRONIC 05/28/2007    PCP: Reynold Bowen, MD  REFERRING PROVIDER: Susa Day, MD  REFERRING DIAG: M25.511 (ICD-10-CM) - Pain in right shoulder  THERAPY DIAG:  Chronic  right shoulder pain  Abnormal posture  Muscle weakness (generalized)  Other symptoms and signs involving the musculoskeletal system  RATIONALE FOR EVALUATION AND TREATMENT:  Rehabilitation  ONSET DATE: chronic for ~5 yrs  SUBJECTIVE:                                                                                                                                                                                      SUBJECTIVE STATEMENT: Pt reports soreness and aching in the same area of her shoulder. 'It was worse over the weekend.'  PAIN:  Are you having pain? Yes: NPRS scale: 5/10 Pain location: R anterior shoulder (pecs) and inferior posterior shoulder Pain description:  "misery"  PERTINENT HISTORY: Recent R TKA (10/01/21) with delayed incisional healing, L TKA 02/05/20, ACDF 2009, OA, DM-II, COPD, HTN, CAD, stage 4 CKD, peripheral neuropathy  PRECAUTIONS: None  WEIGHT BEARING RESTRICTIONS No  FALLS:  Has patient fallen in last 6 months? No  LIVING ENVIRONMENT: Lives with:  sister Lives in: House/apartment Stairs: Yes: Internal: 14 steps; on right going up and External: 4 steps; on right going up, on left going up, and can reach both Has following equipment at home: Single point cane, Walker - 2 wheeled, and Wheelchair (manual)  OCCUPATION: Retired  PLOF: Independent and Leisure: goes to gym & walking around [place] "To have less pain and more ROM, including lifting more overhead w/o me screaming."  OBJECTIVE:   DIAGNOSTIC FINDINGS:  No recent imaging. Per pt, imaging several years ago revealed RTC tear.  PATIENT SURVEYS:  FOTO Shoulder = 59  COGNITION:  Overall cognitive status: Within functional limits for tasks assessed     SENSATION: WFL Except numbness in R 4th & 5th digits  POSTURE: Fwd head and rounded shoulders with protracted scapula R>L  UPPER EXTREMITY ROM:   Active ROM Right 12/14/2021 Left 12/14/2021  Shoulder flexion 152 - pain upon lowering 154  Shoulder extension 47 67  Shoulder abduction 121 - pain upon lowering 146  Shoulder adduction    Shoulder internal rotation 52 80  Shoulder external rotation 48 79  Elbow flexion    Elbow extension    Wrist flexion    Wrist extension    Wrist ulnar deviation    Wrist radial deviation    Wrist pronation    Wrist supination    (Blank rows = not tested)  R shoulder PROM WNL w/o pain except IR mildly limited with painful catch  UPPER EXTREMITY MMT:  MMT Right* 12/14/2021 Left 12/14/2021  Shoulder flexion 3+ 4-  Shoulder extension 4- 4  Shoulder abduction 3+ 4-  Shoulder adduction    Shoulder internal  rotation 4- 4  Shoulder external rotation 3 4-   Middle trapezius    Lower trapezius    Elbow flexion    Elbow extension    Wrist flexion    Wrist extension    Wrist ulnar deviation    Wrist radial deviation    Wrist pronation    Wrist supination    Grip strength (lbs)    (Blank rows = not tested)  *Pain with all resisted motions on R except IR & extension  SHOULDER SPECIAL TESTS:  Impingement tests: Hawkins/Kennedy impingement test: positive   Rotator cuff assessment: Empty can test: positive  and Full can test: negative   JOINT MOBILITY TESTING:  R shoulder WFL  PALPATION:  Increased muscle tension and TTP in R pecs/anterior shoulder as well as posterior shoulder complex.   TODAY'S TREATMENT:  01/05/22 Therapeutic Exercise: UBE L2.5 x 6 min (3 min each fwd & back) R posterior capsule stretch x 30 sec hold R sidelying pec stretch 3x15 sec hold Lat stretch standing at wall/ seated at counter - reviewed both ways  Standing R shoulder flexion and abduction with 1# weight x 10 each Sidelying R shoulder abduction x 10  Manual Therapy: STM to R infraspinatus, lats, teres group, ant deltoid  12/30/21 THERAPEUTIC EXERCISE: to improve strength and mobility.  Verbal and tactile cues throughout for technique. UBE L2.5 x 6 min (3 min each fwd & back) Sidelying R shoulder ABD 0-90 x 10 Sidelying R shoulder CW/CC circles at 90 ABD x 10 each direction Attempted R sidelying scap retraction + shoulder ER but deferred d/t increased pain R "bow & arrow" pec stretch in L sidelying 10 x 5"  MANUAL THERAPY: STM to R periscap muscles, rhomboids, subscapularis, infraspinatus, teres group, lats and pecs R scapular mobs all directions - emphasis on scap retraction & depression MWM for R shoulder ER & ABD in sidelying   12/28/21 THERAPEUTIC EXERCISE: to improve strength and mobility.  Verbal and tactile cues throughout for technique. UBE L2.0 x 6 min (3 min each fwd & back) Standing red TB B row (cues for scap retraction & depression)  10 x 5", 2 sets Standing red TB B straight arm scap retraction + mini shoulder extension 10 x 5", 2 sets Orange Pball flexion roll-up on wall with pause for stretch at top pf motion x 10 - cues to avoid stretching into pain (scaption attempted but deferred d/t increased pain) Small green ball on wall - R shoulder circles CW/CCW at 90 flexion x 10 - pt noting increased pain in posterior shoulder with CW motion R shoulder submax isometrics at wall (flexion, ABD, extension, IR, ER) 10 x 5" each  MANUAL THERAPY:  Rock tape to R shoulder for deltoid inhibition and scap retraction to reduce shoulder impingement STM to R posterior deltoid      PATIENT EDUCATION:    HOME EXERCISE PROGRAM: Access Code: [email or web address]  Exercises - Systems analyst at 60 Degrees Abduction with Arm Straight  - 2-3 x daily - 7 x weekly - 3 reps - 30 sec hold - Single Arm Doorway Pec Stretch at 90 Degrees Abduction  - 2-3 x daily - 7 x weekly - 3 reps - 30 sec hold - Single Arm Doorway Pec Stretch at 120 Degrees Abduction  - 2-3 x daily - 7 x weekly - 3 reps - 30 sec hold - Seated Gentle Upper Trapezius Stretch  - 2-3 x daily - 7 x weekly - 3 reps - 30  sec hold - Gentle Levator Scapulae Stretch (Mirrored)  - 2-3 x daily - 7 x weekly - 3 reps - 30 sec hold - Seated Scapular Retraction  - 2-3 x daily - 7 x weekly - 2 sets - 10 reps - 5 sec hold - Standing Bilateral Low Shoulder Row with Anchored Resistance  - 1 x daily - 7 x weekly - 2 sets - 10 reps (red TB) - Shoulder Flexion Wall Slide with Towel  - 1 x daily - 7 x weekly - 2 sets - 10 reps   ASSESSMENT:  CLINICAL IMPRESSION: Pt presented with continued tightness along posterior and anterior aspects of the R shoulder. The place with the most TTP was her R lats, which we reviewed some ways to stretch this area. She still is struggling with movements away from the body so we increased work on abduction and ER. Cues for proper movement and form with ER.     OBJECTIVE IMPAIRMENTS decreased activity tolerance, decreased knowledge of condition, decreased ROM, decreased strength, increased fascial restrictions, impaired perceived functional ability, increased muscle spasms, impaired flexibility, impaired UE functional use, improper body mechanics, postural dysfunction, and pain.   ACTIVITY LIMITATIONS cleaning, community activity, driving, meal prep, laundry, yard work, shopping, and church.   PERSONAL FACTORS Age, Past/current experiences, Time since onset of injury/illness/exacerbation, and 3+ comorbidities: Recent R TKA (10/01/21) with delayed incisional healing, L TKA 02/05/20, ACDF 2009, OA, DM-II, COPD, HTN, CAD, stage 4 CKD, peripheral neuropathy are also affecting patient's functional outcome.    REHAB POTENTIAL: Good  CLINICAL DECISION MAKING: Stable/uncomplicated  EVALUATION COMPLEXITY: Low   GOALS: Goals reviewed with patient? Yes  SHORT TERM GOALS: Target date: 01/06/2022    Patient will be independent with initial HEP Goal status: MET (01/05/22)  2.  Improve posture and alignment with patient to demonstrate improved upright posture with posterior shoulder girdle engaged Goal status: IN PROGRESS  LONG TERM GOALS: Target date: 01/27/2022    Patient will be independent with ongoing/advanced HEP for self-management at home Goal status: IN PROGRESS  2.  Patient to demonstrate appropriate posture and body mechanics needed for daily activities Goal status: IN PROGRESS  3.  Decrease pain in the R shoulder girdle by 50-75% allowing patient to use R UE for functional activities Goal status: IN PROGRESS  4.  Patient to improve R shoulder AROM to [address] without pain provocation Goal status: IN PROGRESS  5.  Patient will demonstrate improved R shoulder strength to >/= 4 to 4+/5 for functional UE use Goal status: IN PROGRESS  6.  Patient to report ability to perform ADLs, household, and leisure activities without limitation due to R shoulder  pain, LOM or weakness Goal status: IN PROGRESS  7.  Patient will improve shoulder FOTO to >/= 65 to demonstrate improving function Baseline: 59 Goal status: IN PROGRESS   PLAN: PT FREQUENCY: 2x/week  PT DURATION: 6 weeks  PLANNED INTERVENTIONS: Therapeutic exercises, Therapeutic activity, Neuromuscular re-education, Balance training, Gait training, Patient/Family education, Joint mobilization, Dry Needling, Electrical stimulation, Cryotherapy, Moist heat, Taping, Vasopneumatic device, Ultrasound, Ionotophoresis 85m/ml Dexamethasone, and Manual therapy  PLAN FOR NEXT SESSION: progress postural strengthening; gentle R shoulder ROM & stretching; isometric RTC strengthening progressing to light TB resistance as pain allows, MT and modalities to address abnormal muscle tension PRN    BArtist Pais PTA 01/05/2022, 2:22 PM

## 2022-01-06 ENCOUNTER — Other Ambulatory Visit (HOSPITAL_COMMUNITY): Payer: Self-pay

## 2022-01-07 ENCOUNTER — Ambulatory Visit: Payer: Medicare Other | Attending: Specialist

## 2022-01-07 DIAGNOSIS — R29898 Other symptoms and signs involving the musculoskeletal system: Secondary | ICD-10-CM | POA: Insufficient documentation

## 2022-01-07 DIAGNOSIS — R293 Abnormal posture: Secondary | ICD-10-CM | POA: Diagnosis present

## 2022-01-07 DIAGNOSIS — M6281 Muscle weakness (generalized): Secondary | ICD-10-CM | POA: Diagnosis present

## 2022-01-07 DIAGNOSIS — M25511 Pain in right shoulder: Secondary | ICD-10-CM | POA: Insufficient documentation

## 2022-01-07 DIAGNOSIS — G8929 Other chronic pain: Secondary | ICD-10-CM | POA: Diagnosis present

## 2022-01-07 NOTE — Therapy (Signed)
OUTPATIENT PHYSICAL THERAPY TREATMENT NOTE   Patient [name] MRN: [id number] DOB:1956/05/19, 66 y.o., female Today's Date: 01/07/2022   PT End of Session - 01/07/22 1151     Visit Number 7    Number of Visits 12    Date for PT Re-Evaluation 01/27/22    Authorization Type UHC MCR    PT Start Time 4098    PT Stop Time 1146    PT Time Calculation (min) 44 min    Activity Tolerance Patient tolerated treatment well    Behavior During Therapy WFL for tasks assessed/performed                   Past Medical History:  Diagnosis Date   Acute bronchitis    Anemia    hx of anemia   Anginal pain (HCC)    Arthritis    osteo   Asthma    Chronic kidney disease (CKD), stage IV (severe) (HCC)    Coronary atherosclerosis    Cough    GERD (gastroesophageal reflux disease)    History of colon polyps    History of migraine    Hyperlipidemia    Hypertension    Obstructive sleep apnea (adult) (pediatric)    NO CPAP    Peripheral neuropathy    Type II or unspecified type diabetes mellitus without mention of complication, not stated as uncontrolled    type 2   Unspecified hypothyroidism    Past Surgical History:  Procedure Laterality Date   ANTERIOR FUSION CERVICAL SPINE  2009   CARPAL TUNNEL RELEASE Left    COLONOSCOPY     CORONARY STENT INTERVENTION  2007   LEFT HEART CATHETERIZATION WITH CORONARY ANGIOGRAM N/A 05/29/2014   Procedure: LEFT HEART CATHETERIZATION WITH CORONARY ANGIOGRAM;  Surgeon: Sinclair Grooms, MD;  Location: [facility] CATH LAB;  Service: Cardiovascular;  Laterality: N/A;   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   Procedure: TOTAL KNEE ARTHROPLASTY;  Surgeon: Susa Day, MD;  Location: [facility];  Service: Orthopedics;  Laterality: Left;  3 hrs   TOTAL KNEE ARTHROPLASTY Right 10/01/2021   Procedure: TOTAL KNEE ARTHROPLASTY;  Surgeon: Susa Day, MD;  Location: [facility];  Service: Orthopedics;  Laterality: Right;   TUBAL LIGATION  1990    WISDOM TOOTH EXTRACTION     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   Severe persistent asthma 10/05/2020   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   Allergic reaction 04/26/2020   Acute renal failure superimposed on stage 3 chronic kidney disease ([place]) 04/26/2020   Stomatitis 04/26/2020   Allergic reaction caused by a drug 04/26/2020   Rash    Left knee DJD 02/05/2020   Acute bronchitis 08/10/2014   Unstable angina ([place]) 05/29/2014   Abnormal nuclear stress test 05/29/2014   Chest pain 05/06/2014   Exertional shortness of breath 05/06/2014   Mixed hyperlipidemia 05/06/2014   Hyperlipidemia with target LDL less than 70 08/05/2013   HTN (hypertension) 08/05/2013   Chest wall pain 08/05/2013   SINUSITIS, ACUTE 04/07/2009   HYPOTHYROIDISM 12/10/2007   Type 2 diabetes mellitus with hemoglobin A1c goal of less than 7.0% ([place]) 05/28/2007   Obstructive sleep apnea 05/28/2007   Coronary atherosclerosis 05/28/2007   Seasonal and perennial allergic rhinitis 05/28/2007   COUGH, CHRONIC 05/28/2007    PCP: Reynold Bowen, MD  REFERRING PROVIDER: Susa Day, MD  REFERRING DIAG: M25.511 (ICD-10-CM) - Pain in right shoulder  THERAPY DIAG:  Chronic right shoulder pain  Abnormal posture  Muscle weakness (generalized)  Other symptoms and signs involving the musculoskeletal system  RATIONALE FOR EVALUATION AND TREATMENT:  Rehabilitation  ONSET DATE: chronic for ~5 yrs  SUBJECTIVE:                                                                                                                                                                                      SUBJECTIVE STATEMENT: Pt reports that she can't lift a lot over her head, still sore along the ball of the shoulder.  PAIN:  Are you having pain? Yes: NPRS scale: 5/10 Pain location: R anterior shoulder (pecs) and inferior posterior shoulder Pain description:  "misery"  PERTINENT HISTORY: Recent R TKA (10/01/21) with delayed incisional healing, L TKA 02/05/20, ACDF 2009, OA, DM-II, COPD, HTN, CAD, stage 4 CKD, peripheral neuropathy  PRECAUTIONS: None  WEIGHT BEARING RESTRICTIONS No  FALLS:  Has patient fallen in last 6 months? No  LIVING ENVIRONMENT: Lives with:  sister Lives in: House/apartment Stairs: Yes: Internal: 14 steps; on right going up and External: 4 steps; on right going up, on left going up, and can reach both Has following equipment at home: Single point cane, Walker - 2 wheeled, and Wheelchair (manual)  OCCUPATION: Retired  PLOF: Independent and Leisure: goes to gym & walking around [place] "To have less pain and more ROM, including lifting more overhead w/o me screaming."  OBJECTIVE:   DIAGNOSTIC FINDINGS:  No recent imaging. Per pt, imaging several years ago revealed RTC tear.  PATIENT SURVEYS:  FOTO Shoulder = 59  COGNITION:  Overall cognitive status: Within functional limits for tasks assessed     SENSATION: WFL Except numbness in R 4th & 5th digits  POSTURE: Fwd head and rounded shoulders with protracted scapula R>L  UPPER EXTREMITY ROM:   Active ROM Right 12/14/2021 Left 12/14/2021  Shoulder flexion 152 - pain upon lowering 154  Shoulder extension 47 67  Shoulder abduction 121 - pain upon lowering 146  Shoulder adduction    Shoulder internal rotation 52 80  Shoulder external rotation 48 79  Elbow flexion    Elbow extension    Wrist flexion    Wrist extension    Wrist ulnar deviation    Wrist radial deviation    Wrist pronation    Wrist supination    (Blank rows = not tested)  R shoulder PROM WNL w/o pain except IR mildly limited with painful catch  UPPER EXTREMITY MMT:  MMT Right* 12/14/2021 Left 12/14/2021  Shoulder flexion 3+ 4-  Shoulder extension 4- 4  Shoulder abduction 3+ 4-  Shoulder adduction  Shoulder internal rotation 4- 4  Shoulder external rotation 3 4-   Middle trapezius    Lower trapezius    Elbow flexion    Elbow extension    Wrist flexion    Wrist extension    Wrist ulnar deviation    Wrist radial deviation    Wrist pronation    Wrist supination    Grip strength (lbs)    (Blank rows = not tested)  *Pain with all resisted motions on R except IR & extension  SHOULDER SPECIAL TESTS:  Impingement tests: Hawkins/Kennedy impingement test: positive   Rotator cuff assessment: Empty can test: positive  and Full can test: negative   JOINT MOBILITY TESTING:  R shoulder WFL  PALPATION:  Increased muscle tension and TTP in R pecs/anterior shoulder as well as posterior shoulder complex.   TODAY'S TREATMENT:  01/07/22  Therapeutic Exercise: UBE L3.0 x 6 min (3 min each fwd & back) Seated thoracic ext with hands behind head 5x5" Doorway pec stretch 2x30" Cybex 15# 2x10 R IR with red TB 15x R ER with red TB isometric step out 8 reps   Manual Therapy: STM to R LS, infraspinatus, deltoids, biceps 01/05/22 Therapeutic Exercise: UBE L2.5 x 6 min (3 min each fwd & back) R posterior capsule stretch x 30 sec hold R sidelying pec stretch 3x15 sec hold Lat stretch standing at wall/ seated at counter - reviewed both ways  Standing R shoulder flexion and abduction with 1# weight x 10 each Sidelying R shoulder abduction x 10  Manual Therapy: STM to R infraspinatus, lats, teres group, ant deltoid  12/30/21 THERAPEUTIC EXERCISE: to improve strength and mobility.  Verbal and tactile cues throughout for technique. UBE L2.5 x 6 min (3 min each fwd & back) Sidelying R shoulder ABD 0-90 x 10 Sidelying R shoulder CW/CC circles at 90 ABD x 10 each direction Attempted R sidelying scap retraction + shoulder ER but deferred d/t increased pain R "bow & arrow" pec stretch in L sidelying 10 x 5"  MANUAL THERAPY: STM to R periscap muscles, rhomboids, subscapularis, infraspinatus, teres group, lats and pecs R scapular mobs all directions - emphasis  on scap retraction & depression MWM for R shoulder ER & ABD in sidelying        PATIENT EDUCATION:  Updated to HEP verbal instruction and demonstration shown to patient on exercises and explained rationale, she was able to return demonstration  HOME EXERCISE PROGRAM: Access Code: [email or web address]    ASSESSMENT:  CLINICAL IMPRESSION: Pt continues reporting pain along the anterior shoulder so we have been doing MT to address muscle restrictions. Followed manual with stretches for muscle relaxation. Also worked more on postural strengthening and RTC strengthening to promote better alignment of the glenohumeral joint. Pt was able to complete all interventions w/o increased pain. She has now met all STGs.   OBJECTIVE IMPAIRMENTS decreased activity tolerance, decreased knowledge of condition, decreased ROM, decreased strength, increased fascial restrictions, impaired perceived functional ability, increased muscle spasms, impaired flexibility, impaired UE functional use, improper body mechanics, postural dysfunction, and pain.   ACTIVITY LIMITATIONS cleaning, community activity, driving, meal prep, laundry, yard work, shopping, and church.   PERSONAL FACTORS Age, Past/current experiences, Time since onset of injury/illness/exacerbation, and 3+ comorbidities: Recent R TKA (10/01/21) with delayed incisional healing, L TKA 02/05/20, ACDF 2009, OA, DM-II, COPD, HTN, CAD, stage 4 CKD, peripheral neuropathy are also affecting patient's functional outcome.    REHAB POTENTIAL: Good  CLINICAL DECISION MAKING: Stable/uncomplicated  EVALUATION COMPLEXITY:  Low   GOALS: Goals reviewed with patient? Yes  SHORT TERM GOALS: Target date: 01/06/2022    Patient will be independent with initial HEP Goal status: MET (01/05/22)  2.  Improve posture and alignment with patient to demonstrate improved upright posture with posterior shoulder girdle engaged Goal status: MET (01/07/22)   LONG TERM GOALS: Target date:  01/27/2022    Patient will be independent with ongoing/advanced HEP for self-management at home Goal status: IN PROGRESS  2.  Patient to demonstrate appropriate posture and body mechanics needed for daily activities Goal status: IN PROGRESS  3.  Decrease pain in the R shoulder girdle by 50-75% allowing patient to use R UE for functional activities Goal status: IN PROGRESS  4.  Patient to improve R shoulder AROM to [facility] without pain provocation Goal status: IN PROGRESS  5.  Patient will demonstrate improved R shoulder strength to >/= 4 to 4+/5 for functional UE use Goal status: IN PROGRESS  6.  Patient to report ability to perform ADLs, household, and leisure activities without limitation due to R shoulder pain, LOM or weakness Goal status: IN PROGRESS  7.  Patient will improve shoulder FOTO to >/= 65 to demonstrate improving function Baseline: 59 Goal status: IN PROGRESS   PLAN: PT FREQUENCY: 2x/week  PT DURATION: 6 weeks  PLANNED INTERVENTIONS: Therapeutic exercises, Therapeutic activity, Neuromuscular re-education, Balance training, Gait training, Patient/Family education, Joint mobilization, Dry Needling, Electrical stimulation, Cryotherapy, Moist heat, Taping, Vasopneumatic device, Ultrasound, Ionotophoresis 66m/ml Dexamethasone, and Manual therapy  PLAN FOR NEXT SESSION: progress postural strengthening; gentle R shoulder ROM & stretching; isometric RTC strengthening progressing to light TB resistance as pain allows, MT and modalities to address abnormal muscle tension PRN    BArtist Pais PTA 01/07/2022, 11:52 AM

## 2022-01-11 ENCOUNTER — Ambulatory Visit: Payer: Medicare Other | Admitting: Physical Therapy

## 2022-01-11 ENCOUNTER — Encounter: Payer: Self-pay | Admitting: Physical Therapy

## 2022-01-11 DIAGNOSIS — R293 Abnormal posture: Secondary | ICD-10-CM

## 2022-01-11 DIAGNOSIS — M6281 Muscle weakness (generalized): Secondary | ICD-10-CM

## 2022-01-11 DIAGNOSIS — G8929 Other chronic pain: Secondary | ICD-10-CM

## 2022-01-11 DIAGNOSIS — R29898 Other symptoms and signs involving the musculoskeletal system: Secondary | ICD-10-CM

## 2022-01-11 DIAGNOSIS — M25511 Pain in right shoulder: Secondary | ICD-10-CM | POA: Diagnosis not present

## 2022-01-11 NOTE — Therapy (Signed)
OUTPATIENT PHYSICAL THERAPY TREATMENT NOTE   Patient Name: BLINDA TUREK MRN: [id number] DOB:09/13/1955, 66 y.o., female Today's Date: 01/11/2022   PT End of Session - 01/11/22 1106     Visit Number 8    Number of Visits 12    Date for PT Re-Evaluation 01/27/22    Authorization Type [employer] MCR    PT Start Time 1106    PT Stop Time 1151    PT Time Calculation (min) 45 min    Activity Tolerance Patient tolerated treatment well    Behavior During Therapy WFL for tasks assessed/performed                    Past Medical History:  Diagnosis Date   Acute bronchitis    Anemia    hx of anemia   Anginal pain (HCC)    Arthritis    osteo   Asthma    Chronic kidney disease (CKD), stage IV (severe) (HCC)    Coronary atherosclerosis    Cough    GERD (gastroesophageal reflux disease)    History of colon polyps    History of migraine    Hyperlipidemia    Hypertension    Obstructive sleep apnea (adult) (pediatric)    NO CPAP    Peripheral neuropathy    Type II or unspecified type diabetes mellitus without mention of complication, not stated as uncontrolled    type 2   Unspecified hypothyroidism    Past Surgical History:  Procedure Laterality Date   ANTERIOR FUSION CERVICAL SPINE  2009   CARPAL TUNNEL RELEASE Left    COLONOSCOPY     CORONARY STENT INTERVENTION  2007   LEFT HEART CATHETERIZATION WITH CORONARY ANGIOGRAM N/A 05/29/2014   Procedure: LEFT HEART CATHETERIZATION WITH CORONARY ANGIOGRAM;  Surgeon: Sinclair Grooms, MD;  Location: [facility] CATH LAB;  Service: Cardiovascular;  Laterality: N/A;   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   Procedure: TOTAL KNEE ARTHROPLASTY;  Surgeon: Susa Day, MD;  Location: [facility] ORS;  Service: Orthopedics;  Laterality: Left;  3 hrs   TOTAL KNEE ARTHROPLASTY Right 10/01/2021   Procedure: TOTAL KNEE ARTHROPLASTY;  Surgeon: Susa Day, MD;  Location: [facility];  Service: Orthopedics;  Laterality: Right;   TUBAL LIGATION  1990    WISDOM TOOTH EXTRACTION     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   Severe persistent asthma 10/05/2020   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   Allergic reaction 04/26/2020   Acute renal failure superimposed on stage 3 chronic kidney disease ([place]) 04/26/2020   Stomatitis 04/26/2020   Allergic reaction caused by a drug 04/26/2020   Rash    Left knee DJD 02/05/2020   Acute bronchitis 08/10/2014   Unstable angina ([place]) 05/29/2014   Abnormal nuclear stress test 05/29/2014   Chest pain 05/06/2014   Exertional shortness of breath 05/06/2014   Mixed hyperlipidemia 05/06/2014   Hyperlipidemia with target LDL less than 70 08/05/2013   HTN (hypertension) 08/05/2013   Chest wall pain 08/05/2013   SINUSITIS, ACUTE 04/07/2009   HYPOTHYROIDISM 12/10/2007   Type 2 diabetes mellitus with hemoglobin A1c goal of less than 7.0% ([place]) 05/28/2007   Obstructive sleep apnea 05/28/2007   Coronary atherosclerosis 05/28/2007   Seasonal and perennial allergic rhinitis 05/28/2007   COUGH, CHRONIC 05/28/2007    PCP: Reynold Bowen, MD  REFERRING PROVIDER: Susa Day, MD  REFERRING DIAG: M25.511 (ICD-10-CM) - Pain in right shoulder  THERAPY DIAG:  Chronic right shoulder pain  Abnormal posture  Muscle weakness (generalized)  Other symptoms and signs involving the musculoskeletal system  RATIONALE FOR EVALUATION AND TREATMENT:  Rehabilitation  ONSET DATE: chronic for ~5 yrs  SUBJECTIVE:                                                                                                                                                                                      SUBJECTIVE STATEMENT: Pt reports her shoulder was acting up all last week and into the weekend but seems better today - unsure of trigger or relieving factors.  PAIN:  Are you having pain? Yes: NPRS scale:  2/10 Pain location: R anterior shoulder  Pain description:  "misery"  PERTINENT HISTORY: Recent R TKA (10/01/21) with delayed incisional healing, L TKA 02/05/20, ACDF 2009, OA, DM-II, COPD, HTN, CAD, stage 4 CKD, peripheral neuropathy  PRECAUTIONS: None  WEIGHT BEARING RESTRICTIONS No  FALLS:  Has patient fallen in last 6 months? No  LIVING ENVIRONMENT: Lives with:  sister Lives in: House/apartment Stairs: Yes: Internal: 14 steps; on right going up and External: 4 steps; on right going up, on left going up, and can reach both Has following equipment at home: Single point cane, Walker - 2 wheeled, and Wheelchair (manual)  OCCUPATION: Retired  PLOF: Independent and Leisure: goes to gym & walking around [place] "To have less pain and more ROM, including lifting more overhead w/o me screaming."  OBJECTIVE:   DIAGNOSTIC FINDINGS:  No recent imaging. Per pt, imaging several years ago revealed RTC tear.  PATIENT SURVEYS:  FOTO Shoulder = 59  COGNITION:  Overall cognitive status: Within functional limits for tasks assessed     SENSATION: WFL Except numbness in R 4th & 5th digits  POSTURE: Fwd head and rounded shoulders with protracted scapula R>L  UPPER EXTREMITY ROM:   Active ROM Right 12/14/2021 Left 12/14/2021 Right 01/11/22  Shoulder flexion 152 - pain upon lowering 154 162 - pain upon lowering  Shoulder extension 47 67 68  Shoulder abduction 121 - pain upon lowering 146 151 - pain upon lowering  Shoulder adduction     Shoulder internal rotation 52 80 78 - pain  Shoulder external rotation 48 79 84  Elbow flexion     Elbow extension     Wrist flexion     Wrist extension     Wrist ulnar deviation     Wrist radial deviation     Wrist pronation     Wrist supination     (Blank rows = not tested)  12/14/21 - R shoulder PROM WNL w/o pain except IR mildly limited with painful catch  01/11/22 - IR/ER measured in supine with shoulder at ~45 ABD/scaption  UPPER EXTREMITY MMT:  MMT Right* 12/14/2021 Left 12/14/2021  Right* 01/11/2022 Left 01/11/2022  Shoulder flexion 3+ 4- 4- 4  Shoulder extension 4- 4 4 4   Shoulder abduction 3+ 4- 4- 4  Shoulder adduction      Shoulder internal rotation 4- 4 4 4+  Shoulder external rotation 3 4- 3+ 4  Middle trapezius      Lower trapezius      Elbow flexion      Elbow extension      Wrist flexion      Wrist extension      Wrist ulnar deviation      Wrist radial deviation      Wrist pronation      Wrist supination      Grip strength (lbs)      (Blank rows = not tested)  *Pain with all resisted motions on R except IR & extension  SHOULDER SPECIAL TESTS:  Impingement tests: Hawkins/Kennedy impingement test: positive   Rotator cuff assessment: Empty can test: positive  and Full can test: negative   JOINT MOBILITY TESTING:  R shoulder WFL  PALPATION:  Increased muscle tension and TTP in R pecs/anterior shoulder as well as posterior shoulder complex.   TODAY'S TREATMENT:  01/11/22 THERAPEUTIC EXERCISE: to improve flexibility, strength and mobility.  Verbal and tactile cues throughout for technique. UBE L3.0 x 6 min (3 min each fwd & back) Hooklying R shoulder flexion AROM initially with PT faciliation for scap retraction and dpression progressing to AROM with self-awareness of scap retraction/dpression 2 x 10, 3rd set with 1# x 10   MANUAL THERAPY: To promote normalized muscle tension, improved flexibility, increased ROM, and reduced pain. STM/DTM and manual TPR to R anterior deltoid and biceps MWM for scapular retraction and depression during R shoulder flexion  MODALITIES: Ionotophoresis with 1.0 mL 108m/ml Dexamethasone - 4-6 hour patch (815mmin) to R anterior shoulder (patch #1 of 6)   01/07/22 Therapeutic Exercise: UBE L3.0 x 6 min (3 min each fwd & back) Seated thoracic ext with hands behind head 5x5" Doorway pec stretch 2x30" Cybex 15# 2x10 R IR with red TB 15x R ER with red TB isometric step out 8 reps   Manual Therapy: STM to R LS,  infraspinatus, deltoids, biceps   01/05/22 Therapeutic Exercise: UBE L2.5 x 6 min (3 min each fwd & back) R posterior capsule stretch x 30 sec hold R sidelying pec stretch 3x15 sec hold Lat stretch standing at wall/ seated at counter - reviewed both ways  Standing R shoulder flexion and abduction with 1# weight x 10 each Sidelying R shoulder abduction x 10  Manual Therapy: STM to R infraspinatus, lats, teres group, ant deltoid   12/30/21 THERAPEUTIC EXERCISE: to improve strength and mobility.  Verbal and tactile cues throughout for technique. UBE L2.5 x 6 min (3 min each fwd & back) Sidelying R shoulder ABD 0-90 x 10 Sidelying R shoulder CW/CC circles at 90 ABD x 10 each direction Attempted R sidelying scap retraction + shoulder ER but deferred d/t increased pain R "bow & arrow" pec stretch in L sidelying 10 x 5"  MANUAL THERAPY: STM to R periscap muscles, rhomboids, subscapularis, infraspinatus, teres group, lats and pecs R scapular mobs all directions - emphasis on scap retraction & depression MWM for R shoulder ER & ABD in sidelying   PATIENT EDUCATION:  Education details: [place] patch wearing instructions, PT progress to date,  and reinforcement of postural awareness and scapular activation to reduce risk for impingement Person educated: Patient Education method: Explanation, Demonstration, Tactile cues, Verbal cues, and Handouts Education comprehension: verbalized understanding, verbal cues required, tactile cues required, and needs further education   HOME EXERCISE PROGRAM: Access Code: [email or web address]  Exercises - Doorway Pec Stretch at 60 Degrees Abduction with Arm Straight  - 2-3 x daily - 7 x weekly - 3 reps - 30 sec hold - Single Arm Doorway Pec Stretch at 90 Degrees Abduction  - 2-3 x daily - 7 x weekly - 3 reps - 30 sec hold - Single Arm Doorway Pec Stretch at 120 Degrees Abduction  - 2-3 x daily - 7 x weekly - 3 reps - 30 sec hold - Seated Gentle Upper Trapezius  Stretch  - 2-3 x daily - 7 x weekly - 3 reps - 30 sec hold - Gentle Levator Scapulae Stretch (Mirrored)  - 2-3 x daily - 7 x weekly - 3 reps - 30 sec hold - Seated Scapular Retraction  - 2-3 x daily - 7 x weekly - 2 sets - 10 reps - 5 sec hold - Standing Bilateral Low Shoulder Row with Anchored Resistance  - 1 x daily - 7 x weekly - 2 sets - 10 reps - Shoulder Flexion Wall Slide with Towel  - 1 x daily - 7 x weekly - 2 sets - 10 reps - Shoulder Internal Rotation with Resistance  - 1 x daily - 3 x weekly - 3 sets - 10 reps - Shoulder external rotation  - 1 x daily - 3 x weekly - 3 sets - 10 reps   ASSESSMENT:  CLINICAL IMPRESSION: Elanore "Della" reports increased pain with R shoulder "acting up" for the last week but seemingly better today. R shoulder AROM has improved but she continues to experience pain and a "catching" sensation upon eccentric lowering. Increased pain and muscle tension still present in anterior shoulder which was addressed with STM and manual TPR followed by MWM to promote better scapular coordination with shoulder motion to reduce impingement - pt able to complete R shoulder flexion AROM including with 1# weight in supine following MWM w/o pain or catch. Asa Lente will continue to benefit from skilled PT to restore pain free functional ROM and strength in her R shoulder.  OBJECTIVE IMPAIRMENTS decreased activity tolerance, decreased knowledge of condition, decreased ROM, decreased strength, increased fascial restrictions, impaired perceived functional ability, increased muscle spasms, impaired flexibility, impaired UE functional use, improper body mechanics, postural dysfunction, and pain.   ACTIVITY LIMITATIONS cleaning, community activity, driving, meal prep, laundry, yard work, shopping, and church.   PERSONAL FACTORS Age, Past/current experiences, Time since onset of injury/illness/exacerbation, and 3+ comorbidities: Recent R TKA (10/01/21) with delayed incisional healing, L  TKA 02/05/20, ACDF 2009, OA, DM-II, COPD, HTN, CAD, stage 4 CKD, peripheral neuropathy are also affecting patient's functional outcome.   REHAB POTENTIAL: Good  CLINICAL DECISION MAKING: Stable/uncomplicated  EVALUATION COMPLEXITY: Low   GOALS: Goals reviewed with patient? Yes  SHORT TERM GOALS: Target date: 01/06/2022    Patient will be independent with initial HEP Goal status: MET (01/05/22)  2.  Improve posture and alignment with patient to demonstrate improved upright posture with posterior shoulder girdle engaged Goal status: MET (01/07/22)   LONG TERM GOALS: Target date: 01/27/2022    Patient will be independent with ongoing/advanced HEP for self-management at home Goal status: IN PROGRESS  2.  Patient to demonstrate appropriate posture and body mechanics needed  for daily activities Goal status: IN PROGRESS  3.  Decrease pain in the R shoulder girdle by 50-75% allowing patient to use R UE for functional activities Goal status: IN PROGRESS  4.  Patient to improve R shoulder AROM to [facility] without pain provocation Goal status: IN PROGRESS  5.  Patient will demonstrate improved R shoulder strength to >/= 4 to 4+/5 for functional UE use Goal status: IN PROGRESS  6.  Patient to report ability to perform ADLs, household, and leisure activities without limitation due to R shoulder pain, LOM or weakness Goal status: IN PROGRESS  7.  Patient will improve shoulder FOTO to >/= 65 to demonstrate improving function Baseline: 59 Goal status: IN PROGRESS   PLAN: PT FREQUENCY: 2x/week  PT DURATION: 6 weeks  PLANNED INTERVENTIONS: Therapeutic exercises, Therapeutic activity, Neuromuscular re-education, Balance training, Gait training, Patient/Family education, Joint mobilization, Dry Needling, Electrical stimulation, Cryotherapy, Moist heat, Taping, Vasopneumatic device, Ultrasound, Ionotophoresis 82m/ml Dexamethasone, and Manual therapy  PLAN FOR NEXT SESSION: assess response to ionto  patch; progress postural/scapular strengthening; gentle R shoulder ROM & stretching; isometric RTC strengthening progressing to light TB resistance as pain allows, MT and modalities to address abnormal muscle tension PRN    JPercival Spanish PT 01/11/2022, 12:16 PM

## 2022-01-13 ENCOUNTER — Ambulatory Visit: Payer: Medicare Other

## 2022-01-13 DIAGNOSIS — R293 Abnormal posture: Secondary | ICD-10-CM

## 2022-01-13 DIAGNOSIS — G8929 Other chronic pain: Secondary | ICD-10-CM

## 2022-01-13 DIAGNOSIS — M25511 Pain in right shoulder: Secondary | ICD-10-CM | POA: Diagnosis not present

## 2022-01-13 DIAGNOSIS — R29898 Other symptoms and signs involving the musculoskeletal system: Secondary | ICD-10-CM

## 2022-01-13 DIAGNOSIS — M6281 Muscle weakness (generalized): Secondary | ICD-10-CM

## 2022-01-13 NOTE — Therapy (Signed)
OUTPATIENT PHYSICAL THERAPY TREATMENT NOTE   Patient [name] MRN: [id number] DOB:14-Aug-1955, 66 y.o., female Today's Date: 01/13/2022   PT End of Session - 01/13/22 1153     Visit Number 9    Number of Visits 12    Date for PT Re-Evaluation 01/27/22    Authorization Type [employer] MCR    PT Start Time 1105    PT Stop Time 1159    PT Time Calculation (min) 54 min    Activity Tolerance Patient tolerated treatment well    Behavior During Therapy WFL for tasks assessed/performed                     Past Medical History:  Diagnosis Date   Acute bronchitis    Anemia    hx of anemia   Anginal pain (HCC)    Arthritis    osteo   Asthma    Chronic kidney disease (CKD), stage IV (severe) (HCC)    Coronary atherosclerosis    Cough    GERD (gastroesophageal reflux disease)    History of colon polyps    History of migraine    Hyperlipidemia    Hypertension    Obstructive sleep apnea (adult) (pediatric)    NO CPAP    Peripheral neuropathy    Type II or unspecified type diabetes mellitus without mention of complication, not stated as uncontrolled    type 2   Unspecified hypothyroidism    Past Surgical History:  Procedure Laterality Date   ANTERIOR FUSION CERVICAL SPINE  2009   CARPAL TUNNEL RELEASE Left    COLONOSCOPY     CORONARY STENT INTERVENTION  2007   LEFT HEART CATHETERIZATION WITH CORONARY ANGIOGRAM N/A 05/29/2014   Procedure: LEFT HEART CATHETERIZATION WITH CORONARY ANGIOGRAM;  Surgeon: Sinclair Grooms, MD;  Location: [facility] CATH LAB;  Service: Cardiovascular;  Laterality: N/A;   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   Procedure: TOTAL KNEE ARTHROPLASTY;  Surgeon: Susa Day, MD;  Location: [facility] ORS;  Service: Orthopedics;  Laterality: Left;  3 hrs   TOTAL KNEE ARTHROPLASTY Right 10/01/2021   Procedure: TOTAL KNEE ARTHROPLASTY;  Surgeon: Susa Day, MD;  Location: [facility];  Service: Orthopedics;  Laterality: Right;   TUBAL LIGATION  1990    WISDOM TOOTH EXTRACTION     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   Severe persistent asthma 10/05/2020   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   Allergic reaction 04/26/2020   Acute renal failure superimposed on stage 3 chronic kidney disease ([place]) 04/26/2020   Stomatitis 04/26/2020   Allergic reaction caused by a drug 04/26/2020   Rash    Left knee DJD 02/05/2020   Acute bronchitis 08/10/2014   Unstable angina ([place]) 05/29/2014   Abnormal nuclear stress test 05/29/2014   Chest pain 05/06/2014   Exertional shortness of breath 05/06/2014   Mixed hyperlipidemia 05/06/2014   Hyperlipidemia with target LDL less than 70 08/05/2013   HTN (hypertension) 08/05/2013   Chest wall pain 08/05/2013   SINUSITIS, ACUTE 04/07/2009   HYPOTHYROIDISM 12/10/2007   Type 2 diabetes mellitus with hemoglobin A1c goal of less than 7.0% ([place]) 05/28/2007   Obstructive sleep apnea 05/28/2007   Coronary atherosclerosis 05/28/2007   Seasonal and perennial allergic rhinitis 05/28/2007   COUGH, CHRONIC 05/28/2007    PCP: Reynold Bowen, MD  REFERRING PROVIDER: Susa Day, MD  REFERRING DIAG: M25.511 (ICD-10-CM) - Pain in right shoulder  THERAPY  DIAG:  Chronic right shoulder pain  Abnormal posture  Muscle weakness (generalized)  Other symptoms and signs involving the musculoskeletal system  RATIONALE FOR EVALUATION AND TREATMENT:  Rehabilitation  ONSET DATE: chronic for ~5 yrs  SUBJECTIVE:                                                                                                                                                                                      SUBJECTIVE STATEMENT: Pt reports her shoulder is better today as the patch from last time helped. PAIN:  Are you having pain? Yes: NPRS scale:  2/10 Pain location: R anterior shoulder  Pain description: "misery"  PERTINENT HISTORY: Recent R TKA (10/01/21) with delayed  incisional healing, L TKA 02/05/20, ACDF 2009, OA, DM-II, COPD, HTN, CAD, stage 4 CKD, peripheral neuropathy  PRECAUTIONS: None  WEIGHT BEARING RESTRICTIONS No  FALLS:  Has patient fallen in last 6 months? No  LIVING ENVIRONMENT: Lives with:  sister Lives in: House/apartment Stairs: Yes: Internal: 14 steps; on right going up and External: 4 steps; on right going up, on left going up, and can reach both Has following equipment at home: Single point cane, Walker - 2 wheeled, and Wheelchair (manual)  OCCUPATION: Retired  PLOF: Independent and Leisure: goes to gym & walking around [place] "To have less pain and more ROM, including lifting more overhead w/o me screaming."  OBJECTIVE:   DIAGNOSTIC FINDINGS:  No recent imaging. Per pt, imaging several years ago revealed RTC tear.  PATIENT SURVEYS:  FOTO Shoulder = 59  COGNITION:  Overall cognitive status: Within functional limits for tasks assessed     SENSATION: WFL Except numbness in R 4th & 5th digits  POSTURE: Fwd head and rounded shoulders with protracted scapula R>L  UPPER EXTREMITY ROM:   Active ROM Right 12/14/2021 Left 12/14/2021 Right 01/11/22  Shoulder flexion 152 - pain upon lowering 154 162 - pain upon lowering  Shoulder extension 47 67 68  Shoulder abduction 121 - pain upon lowering 146 151 - pain upon lowering  Shoulder adduction     Shoulder internal rotation 52 80 78 - pain  Shoulder external rotation 48 79 84  Elbow flexion     Elbow extension     Wrist flexion     Wrist extension     Wrist ulnar deviation     Wrist radial deviation     Wrist pronation     Wrist supination     (Blank rows = not tested)  12/14/21 - R shoulder PROM WNL w/o pain except IR mildly limited with painful catch 01/11/22 - IR/ER measured in supine with shoulder at ~45  ABD/scaption  UPPER EXTREMITY MMT:  MMT Right* 12/14/2021 Left 12/14/2021 Right* 01/11/2022 Left 01/11/2022  Shoulder flexion 3+ 4- 4- 4  Shoulder  extension 4- _0 Shoulder abduction 3+ 4- 4- 4  Shoulder adduction      Shoulder internal rotation 4- 4 4 4+  Shoulder external rotation 3 4- 3+ 4  Middle trapezius      Lower trapezius      Elbow flexion      Elbow extension      Wrist flexion      Wrist extension      Wrist ulnar deviation      Wrist radial deviation      Wrist pronation      Wrist supination      Grip strength (lbs)      (Blank rows = not tested)  *Pain with all resisted motions on R except IR & extension  SHOULDER SPECIAL TESTS:  Impingement tests: Hawkins/Kennedy impingement test: positive   Rotator cuff assessment: Empty can test: positive  and Full can test: negative   JOINT MOBILITY TESTING:  R shoulder WFL  PALPATION:  Increased muscle tension and TTP in R pecs/anterior shoulder as well as posterior shoulder complex.   TODAY'S TREATMENT:  01/13/22 Therapeutic Exercise: UBE L3.0 x 6 min (3 min each fwd & back) Hooklying shoulder flexion 20x 1# Hooklying shoulder scaption 20x 1# S/L R shoulder abduction with 1# weight 20 reps S/L horizontal abduction 10 reps Scapular depression with 5# on lat pull machine in standing  Serratus punches on chest press machine 10# x 20   Modalities: Vasopneumatic:  To R shoulder post session- 10 min, 34 deg, low compression  UBE  01/11/22 THERAPEUTIC EXERCISE: to improve flexibility, strength and mobility.  Verbal and tactile cues throughout for technique. UBE L3.0 x 6 min (3 min each fwd & back) Hooklying R shoulder flexion AROM initially with PT faciliation for scap retraction and dpression progressing to AROM with self-awareness of scap retraction/dpression 2 x 10, 3rd set with 1# x 10   MANUAL THERAPY: To promote normalized muscle tension, improved flexibility, increased ROM, and reduced pain. STM/DTM and manual TPR to R anterior deltoid and biceps MWM for scapular retraction and depression during R shoulder flexion  MODALITIES: Ionotophoresis with 1.0  mL 1m/ml Dexamethasone - 4-6 hour patch (824mmin) to R anterior shoulder (patch #1 of 6)   01/07/22 Therapeutic Exercise: UBE L3.0 x 6 min (3 min each fwd & back) Seated thoracic ext with hands behind head 5x5" Doorway pec stretch 2x30" Cybex 15# 2x10 R IR with red TB 15x R ER with red TB isometric step out 8 reps   Manual Therapy: STM to R LS, infraspinatus, deltoids, biceps         PATIENT EDUCATION:  Education details: Ionto patch wearing instructions, PT progress to date, and reinforcement of postural awareness and scapular activation to reduce risk for impingement Person educated: Patient Education method: Explanation, Demonstration, Tactile cues, Verbal cues, and Handouts Education comprehension: verbalized understanding, verbal cues required, tactile cues required, and needs further education   HOME EXERCISE PROGRAM: Access Code: [email or web address]  Exercises - Doorway Pec Stretch at 60 Degrees Abduction with Arm Straight  - 2-3 x daily - 7 x weekly - 3 reps - 30 sec hold - Single Arm Doorway Pec Stretch at 90 Degrees Abduction  - 2-3 x daily - 7 x weekly - 3 reps - 30 sec hold - Single Arm Doorway Pec Stretch at 120  Degrees Abduction  - 2-3 x daily - 7 x weekly - 3 reps - 30 sec hold - Seated Gentle Upper Trapezius Stretch  - 2-3 x daily - 7 x weekly - 3 reps - 30 sec hold - Gentle Levator Scapulae Stretch (Mirrored)  - 2-3 x daily - 7 x weekly - 3 reps - 30 sec hold - Seated Scapular Retraction  - 2-3 x daily - 7 x weekly - 2 sets - 10 reps - 5 sec hold - Standing Bilateral Low Shoulder Row with Anchored Resistance  - 1 x daily - 7 x weekly - 2 sets - 10 reps - Shoulder Flexion Wall Slide with Towel  - 1 x daily - 7 x weekly - 2 sets - 10 reps - Shoulder Internal Rotation with Resistance  - 1 x daily - 3 x weekly - 3 sets - 10 reps - Shoulder external rotation  - 1 x daily - 3 x weekly - 3 sets - 10 reps   ASSESSMENT:  CLINICAL IMPRESSION: Arfa "Asa Lente" still has a  tendency to hike R shoulder even at rest. We worked on [facility] strengthening while emphasizing scapular retraction and depression. She only had reports of pain with the hooklying scaption but only when she allowed her shoulder to drop into abduction. Cues required with scapular depression and serratus punches for scapular movement instead of total arm movement. Ended session with GR to address swelling and soreness post session.   OBJECTIVE IMPAIRMENTS decreased activity tolerance, decreased knowledge of condition, decreased ROM, decreased strength, increased fascial restrictions, impaired perceived functional ability, increased muscle spasms, impaired flexibility, impaired UE functional use, improper body mechanics, postural dysfunction, and pain.   ACTIVITY LIMITATIONS cleaning, community activity, driving, meal prep, laundry, yard work, shopping, and church.   PERSONAL FACTORS Age, Past/current experiences, Time since onset of injury/illness/exacerbation, and 3+ comorbidities: Recent R TKA (10/01/21) with delayed incisional healing, L TKA 02/05/20, ACDF 2009, OA, DM-II, COPD, HTN, CAD, stage 4 CKD, peripheral neuropathy are also affecting patient's functional outcome.   REHAB POTENTIAL: Good  CLINICAL DECISION MAKING: Stable/uncomplicated  EVALUATION COMPLEXITY: Low   GOALS: Goals reviewed with patient? Yes  SHORT TERM GOALS: Target date: 01/06/2022    Patient will be independent with initial HEP Goal status: MET (01/05/22)  2.  Improve posture and alignment with patient to demonstrate improved upright posture with posterior shoulder girdle engaged Goal status: MET (01/07/22)   LONG TERM GOALS: Target date: 01/27/2022    Patient will be independent with ongoing/advanced HEP for self-management at home Goal status: IN PROGRESS  2.  Patient to demonstrate appropriate posture and body mechanics needed for daily activities Goal status: IN PROGRESS  3.  Decrease pain in the R shoulder girdle by  50-75% allowing patient to use R UE for functional activities Goal status: IN PROGRESS  4.  Patient to improve R shoulder AROM to [facility] without pain provocation Goal status: IN PROGRESS  5.  Patient will demonstrate improved R shoulder strength to >/= 4 to 4+/5 for functional UE use Goal status: IN PROGRESS  6.  Patient to report ability to perform ADLs, household, and leisure activities without limitation due to R shoulder pain, LOM or weakness Goal status: IN PROGRESS  7.  Patient will improve shoulder FOTO to >/= 65 to demonstrate improving function Baseline: 59 Goal status: IN PROGRESS   PLAN: PT FREQUENCY: 2x/week  PT DURATION: 6 weeks  PLANNED INTERVENTIONS: Therapeutic exercises, Therapeutic activity, Neuromuscular re-education, Balance training, Gait training, Patient/Family  education, Joint mobilization, Dry Needling, Electrical stimulation, Cryotherapy, Moist heat, Taping, Vasopneumatic device, Ultrasound, Ionotophoresis 42m/ml Dexamethasone, and Manual therapy  PLAN FOR NEXT SESSION: progress postural/scapular strengthening; gentle R shoulder ROM & stretching; isometric RTC strengthening progressing to light TB resistance as pain allows, MT and modalities to address abnormal muscle tension PRN    BArtist Pais PTA 01/13/2022, 12:05 PM

## 2022-01-17 ENCOUNTER — Other Ambulatory Visit (HOSPITAL_COMMUNITY): Payer: Self-pay

## 2022-01-18 ENCOUNTER — Encounter: Payer: Self-pay | Admitting: Physical Therapy

## 2022-01-18 ENCOUNTER — Ambulatory Visit: Payer: Medicare Other | Admitting: Physical Therapy

## 2022-01-18 DIAGNOSIS — R29898 Other symptoms and signs involving the musculoskeletal system: Secondary | ICD-10-CM

## 2022-01-18 DIAGNOSIS — M25511 Pain in right shoulder: Secondary | ICD-10-CM | POA: Diagnosis not present

## 2022-01-18 DIAGNOSIS — G8929 Other chronic pain: Secondary | ICD-10-CM

## 2022-01-18 DIAGNOSIS — R293 Abnormal posture: Secondary | ICD-10-CM

## 2022-01-18 DIAGNOSIS — M6281 Muscle weakness (generalized): Secondary | ICD-10-CM

## 2022-01-18 NOTE — Therapy (Signed)
OUTPATIENT PHYSICAL THERAPY TREATMENT NOTE / PROGRESS NOTE   Patient [name] MRN: [id number] DOB:[birth year], 66 y.o., female Today's Date: 01/18/2022  Progress Note  Reporting Period 12/16/2021 to 01/18/2022  See note below for Objective Data and Assessment of Progress/Goals.      PT End of Session - 01/18/22 1103     Visit Number 10    Number of Visits 12    Date for PT Re-Evaluation 01/27/22    Authorization Type [employer] MCR    PT Start Time 1103    PT Stop Time 1145    PT Time Calculation (min) 42 min    Activity Tolerance Patient tolerated treatment well    Behavior During Therapy WFL for tasks assessed/performed               Past Medical History:  Diagnosis Date   Acute bronchitis    Anemia    hx of anemia   Anginal pain (HCC)    Arthritis    osteo   Asthma    Chronic kidney disease (CKD), stage IV (severe) (HCC)    Coronary atherosclerosis    Cough    GERD (gastroesophageal reflux disease)    History of colon polyps    History of migraine    Hyperlipidemia    Hypertension    Obstructive sleep apnea (adult) (pediatric)    NO CPAP    Peripheral neuropathy    Type II or unspecified type diabetes mellitus without mention of complication, not stated as uncontrolled    type 2   Unspecified hypothyroidism    Past Surgical History:  Procedure Laterality Date   ANTERIOR FUSION CERVICAL SPINE  2009   CARPAL TUNNEL RELEASE Left    COLONOSCOPY     CORONARY STENT INTERVENTION  2007   LEFT HEART CATHETERIZATION WITH CORONARY ANGIOGRAM N/A 05/29/2014   Procedure: LEFT HEART CATHETERIZATION WITH CORONARY ANGIOGRAM;  Surgeon: Sinclair Grooms, MD;  Location: [facility] CATH LAB;  Service: Cardiovascular;  Laterality: N/A;   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   Procedure: TOTAL KNEE ARTHROPLASTY;  Surgeon: Susa Day, MD;  Location: [facility] ORS;  Service: Orthopedics;  Laterality: Left;  3 hrs   TOTAL KNEE ARTHROPLASTY Right 10/01/2021   Procedure:  TOTAL KNEE ARTHROPLASTY;  Surgeon: Susa Day, MD;  Location: [facility] OR[facility];  Service: Orthopedics;  Laterality: Right;   TUBAL LIGATION  1990   WISDOM TOOTH EXTRACTION     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   Severe persistent asthma 10/05/2020   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[place]) 04/27/2020   Allergic reaction 04/26/2020   Acute renal failure superimposed on stage 3 chronic kidney disease ([place]) 04/26/2020   Stomatitis 04/26/2020   Allergic reaction caused by a drug 04/26/2020   Rash    Left knee DJD 02/05/2020   Acute bronchitis 08/10/2014   Unstable angina ([place]) 05/29/2014   Abnormal nuclear stress test 05/29/2014   Chest pain 05/06/2014   Exertional shortness of breath 05/06/2014   Mixed hyperlipidemia 05/06/2014   Hyperlipidemia with target LDL less than 70 08/05/2013   HTN (hypertension) 08/05/2013   Chest wall pain 08/05/2013   SINUSITIS, ACUTE 04/07/2009   HYPOTHYROIDISM 12/10/2007   Type 2 diabetes mellitus with hemoglobin A1c goal of less than 7.0% ([place]) 05/28/2007   Obstructive sleep apnea 05/28/2007   Coronary atherosclerosis 05/28/2007   Seasonal and perennial allergic rhinitis 05/28/2007   COUGH, CHRONIC 05/28/2007    PCP: [place],  Annie Main, MD  REFERRING PROVIDER: Susa Day, MD  REFERRING DIAG: [phone] (ICD-10-CM) - Pain in right shoulder  THERAPY DIAG:  Chronic right shoulder pain  Abnormal posture  Muscle weakness (generalized)  Other symptoms and signs involving the musculoskeletal system  RATIONALE FOR EVALUATION AND TREATMENT:  Rehabilitation  ONSET DATE: chronic for ~5 yrs  SUBJECTIVE:                                                                                                                                                                                      SUBJECTIVE STATEMENT: Pt reports 90% improvement in her R shoulder pain, allowing her to sleep on her R side now where she could not  previously.  PAIN:  Are you having pain? Yes: NPRS scale: 3-4/10 Pain location: R anterior shoulder/pecs  Pain description: tender  PERTINENT HISTORY: Recent R TKA (10/01/21) with delayed incisional healing, L TKA 02/05/20, ACDF 2009, OA, DM-II, COPD, HTN, CAD, stage 4 CKD, peripheral neuropathy  PRECAUTIONS: None  WEIGHT BEARING RESTRICTIONS No  FALLS:  Has patient fallen in last 6 months? No  LIVING ENVIRONMENT: Lives with:  sister Lives in: House/apartment Stairs: Yes: Internal: 14 steps; on right going up and External: 4 steps; on right going up, on left going up, and can reach both Has following equipment at home: Single point cane, Walker - 2 wheeled, and Wheelchair (manual)  OCCUPATION: Retired  PLOF: Independent and Leisure: goes to gym & walking around [place] "To have less pain and more ROM, including lifting more overhead w/o me screaming."  OBJECTIVE:   DIAGNOSTIC FINDINGS:  No recent imaging. Per pt, imaging several years ago revealed RTC tear.  PATIENT SURVEYS:  FOTO Shoulder = 59  COGNITION:  Overall cognitive status: Within functional limits for tasks assessed     SENSATION: WFL Except numbness in R 4th & 5th digits  POSTURE: Fwd head and rounded shoulders with protracted scapula R>L  UPPER EXTREMITY ROM:   Active ROM Right 12/14/2021 Left 12/14/2021 Right 01/11/22 Right  01/18/22  Shoulder flexion 152 - pain upon lowering 154 162 - pain upon lowering 164 No catch  Shoulder extension 47 67 68 68  Shoulder abduction 121 - pain upon lowering 146 151 - pain upon lowering 164 No catch  Shoulder adduction      Shoulder internal rotation 52 80 78 - pain 71 - pain FIR WFL  Shoulder external rotation 48 79 84 90  Elbow flexion      Elbow extension      Wrist flexion      Wrist extension      Wrist ulnar deviation  Wrist radial deviation      Wrist pronation      Wrist supination      (Blank rows = not tested)  12/14/21 - R  shoulder PROM WNL w/o pain except IR mildly limited with painful catch 01/11/22 - IR/ER measured in supine with shoulder at ~45 ABD/scaption  UPPER EXTREMITY MMT:  MMT Right* 12/14/2021 Left 12/14/2021 Right* 01/11/2022 Left 01/11/2022 Right* 01/18/2022 Left 01/18/2022  Shoulder flexion 3+ 4- 4- 4 4- 4  Shoulder extension 4- _0 4+ 4+  Shoulder abduction 3+ 4- 4- 4 4- 4  Shoulder adduction        Shoulder internal rotation 4- 4 4 4+ 4+ 4+  Shoulder external rotation 3 4- 3+ 4 4- 4  Middle trapezius        Lower trapezius        Elbow flexion        Elbow extension        Wrist flexion        Wrist extension        Wrist ulnar deviation        Wrist radial deviation        Wrist pronation        Wrist supination        Grip strength (lbs)        (Blank rows = not tested)  *Pain with all resisted motions on R except IR & extension  SHOULDER SPECIAL TESTS:  Impingement tests: Hawkins/Kennedy impingement test: positive   Rotator cuff assessment: Empty can test: positive  and Full can test: negative   JOINT MOBILITY TESTING:  R shoulder WFL  PALPATION:  Increased muscle tension and TTP in R pecs/anterior shoulder as well as posterior shoulder complex.   TODAY'S TREATMENT:   01/18/22 THERAPEUTIC EXERCISE: to improve flexibility, strength and mobility.  Verbal and tactile cues throughout for technique. UBE L3.0 x 6 min (3 min each fwd & back) Standing RTB R shoulder IR (neutral shoulder) x 10 - cues to keep elbows tucked at side Standing RTB R shoulder ER (neutral shoulder) - attempted but unable to create AROM therefore reduced resistance to YTB but still unable to create AROM Standing YTB B shoulder ER (neutral shoulder) with back to doorframe to promote better scap retraction - attempted but still unable to create AROM on R Standing R shoulder YTB ER isometric step-out x 8 - limited control with increased pain reported at medial scapula and anterior shoulder - able to resolve  scapular pain with cues for scap retraction but still causing increased pain in anterior shoulder R shoulder self-resisted ER isometric with cues for scap retraction 10 x 5" - able to tolerate w/o increased pain (HEP modified to this version)  SELF CARE: Review of self-STM to R pecs and anterior shoulder using ball on wall   01/13/22 Therapeutic Exercise: UBE L3.0 x 8 min (4 min each fwd & back) Hooklying shoulder flexion 20x 1# Hooklying shoulder scaption 20x 1# S/L R shoulder abduction with 1# weight 20 reps S/L horizontal abduction 10 reps Scapular depression with 5# on lat pull machine in standing  Serratus punches on chest press machine 10# x 20   Modalities: Vasopneumatic: To R shoulder post session- 10 min, 34 deg, low compression    01/11/22 THERAPEUTIC EXERCISE: to improve flexibility, strength and mobility.  Verbal and tactile cues throughout for technique. UBE L3.0 x 6 min (3 min each fwd & back) Hooklying R shoulder flexion AROM initially with  PT faciliation for scap retraction and dpression progressing to AROM with self-awareness of scap retraction/dpression 2 x 10, 3rd set with 1# x 10   MANUAL THERAPY: To promote normalized muscle tension, improved flexibility, increased ROM, and reduced pain. STM/DTM and manual TPR to R anterior deltoid and biceps MWM for scapular retraction and depression during R shoulder flexion  MODALITIES: Ionotophoresis with 1.0 mL 51m/ml Dexamethasone - 4-6 hour patch (858mmin) to R anterior shoulder (patch #1 of 6)   PATIENT EDUCATION:  Education details: HEP update - modification of R shoulder ER isometric, review of self-STM using ball on wall, and PT progress to date with plan for transition to HEP at end of current POC Person educated: Patient Education method: Explanation, Demonstration, Tactile cues, Verbal cues, and Handouts Education comprehension: verbalized understanding, verbal cues required, tactile cues required, and needs  further education   HOME EXERCISE PROGRAM: Access Code: [email or web address]  Exercises - Doorway Pec Stretch at 60 Degrees Abduction with Arm Straight  - 2-3 x daily - 7 x weekly - 3 reps - 30 sec hold - Single Arm Doorway Pec Stretch at 90 Degrees Abduction  - 2-3 x daily - 7 x weekly - 3 reps - 30 sec hold - Single Arm Doorway Pec Stretch at 120 Degrees Abduction  - 2-3 x daily - 7 x weekly - 3 reps - 30 sec hold - Seated Gentle Upper Trapezius Stretch  - 2-3 x daily - 7 x weekly - 3 reps - 30 sec hold - Gentle Levator Scapulae Stretch (Mirrored)  - 2-3 x daily - 7 x weekly - 3 reps - 30 sec hold - Seated Scapular Retraction  - 2-3 x daily - 7 x weekly - 2 sets - 10 reps - 5 sec hold - Standing Bilateral Low Shoulder Row with Anchored Resistance  - 1 x daily - 7 x weekly - 2 sets - 10 reps - Shoulder Flexion Wall Slide with Towel  - 1 x daily - 7 x weekly - 2 sets - 10 reps - Shoulder Internal Rotation with Resistance  - 1 x daily - 3 x weekly - 3 sets - 10 reps - Isometric Shoulder External Rotation (Mirrored)  - 1 x daily - 7 x weekly - 2 sets - 10 reps - 5 sec hold   ASSESSMENT:  CLINICAL IMPRESSION: Getsemani "Della" reports 90% improvement in her R shoulder pain since starting PT for her shoulder. She is now able to sleep on her R side w/o pain interference and reports no limitations in her daily activities due to R shoulder pain. She demonstrates full functional ROM of R shoulder and no longer notes pain or catching with eccentric lowering. R shoulder strength has improved but still most limited in ER along with flexion and abduction with pain still noted on manual resistance. DeAsa Lenteas met the majority of her LTGs with remaining goals at least partially met. She is pleased with her progress and feels that she will be ready to transition to her HEP as of the end of her current POC, therefore remainder of session focused on initiation of review and update of her HEP which will continue in remaining  visits.   OBJECTIVE IMPAIRMENTS decreased activity tolerance, decreased knowledge of condition, decreased ROM, decreased strength, increased fascial restrictions, impaired perceived functional ability, increased muscle spasms, impaired flexibility, impaired UE functional use, improper body mechanics, postural dysfunction, and pain.   ACTIVITY LIMITATIONS cleaning, community activity, driving, meal prep, laundry, yard work, shopping, and  church.   PERSONAL FACTORS Age, Past/current experiences, Time since onset of injury/illness/exacerbation, and 3+ comorbidities: Recent R TKA (10/01/21) with delayed incisional healing, L TKA 02/05/20, ACDF 2009, OA, DM-II, COPD, HTN, CAD, stage 4 CKD, peripheral neuropathy are also affecting patient's functional outcome.   REHAB POTENTIAL: Good  CLINICAL DECISION MAKING: Stable/uncomplicated  EVALUATION COMPLEXITY: Low   GOALS: Goals reviewed with patient? Yes  SHORT TERM GOALS: Target date: 01/06/2022    Patient will be independent with initial HEP Goal status: MET (01/05/22)  2.  Improve posture and alignment with patient to demonstrate improved upright posture with posterior shoulder girdle engaged Goal status: MET (01/07/22)   LONG TERM GOALS: Target date: 01/27/2022    Patient will be independent with ongoing/advanced HEP for self-management at home Goal status: IN PROGRESS  2.  Patient to demonstrate appropriate posture and body mechanics needed for daily activities Goal status: MET  (01/18/22)  3.  Decrease pain in the R shoulder girdle by 50-75% allowing patient to use R UE for functional activities Goal status: MET  (01/18/22 - Pt reporting 90% improvement in her pain)  4.  Patient to improve R shoulder AROM to [facility] without pain provocation Goal status: MET  (01/18/22)  5.  Patient will demonstrate improved R shoulder strength to >/= 4 to 4+/5 for functional UE use Goal status: IN PROGRESS  (01/18/22 - Partially met for extension & IR, remaining  motions 4-/5)  6.  Patient to report ability to perform ADLs, household, and leisure activities without limitation due to R shoulder pain, LOM or weakness Goal status: MET  (01/18/22)  7.  Patient will improve shoulder FOTO to >/= 65 to demonstrate improving function Baseline: 59 Goal status: MET (01/18/22 - shoulder FOTO = 72)   PLAN: PT FREQUENCY: 2x/week  PT DURATION: 6 weeks  PLANNED INTERVENTIONS: Therapeutic exercises, Therapeutic activity, Neuromuscular re-education, Balance training, Gait training, Patient/Family education, Joint mobilization, Dry Needling, Electrical stimulation, Cryotherapy, Moist heat, Taping, Vasopneumatic device, Ultrasound, Ionotophoresis 92m/ml Dexamethasone, and Manual therapy  PLAN FOR NEXT SESSION: review & update HEP in prep for transition to HEP at end f current POC; progress postural/scapular strengthening; gentle R shoulder ROM & stretching; isometric RTC strengthening progressing to light TB resistance as pain allows; MT and modalities to address abnormal muscle tension PRN    JPercival Spanish PT 01/18/2022, 12:12 PM

## 2022-01-20 ENCOUNTER — Ambulatory Visit: Payer: Medicare Other

## 2022-01-20 DIAGNOSIS — M6281 Muscle weakness (generalized): Secondary | ICD-10-CM

## 2022-01-20 DIAGNOSIS — R29898 Other symptoms and signs involving the musculoskeletal system: Secondary | ICD-10-CM

## 2022-01-20 DIAGNOSIS — G8929 Other chronic pain: Secondary | ICD-10-CM

## 2022-01-20 DIAGNOSIS — M25511 Pain in right shoulder: Secondary | ICD-10-CM | POA: Diagnosis not present

## 2022-01-20 DIAGNOSIS — R293 Abnormal posture: Secondary | ICD-10-CM

## 2022-01-20 NOTE — Therapy (Signed)
OUTPATIENT PHYSICAL THERAPY TREATMENT NOTE / PROGRESS NOTE   Patient [name] MRN: [id number] DOB:05/22/1956, 66 y.o., female Today's Date: 01/20/2022      PT End of Session - 01/20/22 1157     Visit Number 11    Number of Visits 12    Date for PT Re-Evaluation 01/27/22    Authorization Type [employer] MCR    PT Start Time 1103    PT Stop Time 1145    PT Time Calculation (min) 42 min    Activity Tolerance Patient tolerated treatment well    Behavior During Therapy WFL for tasks assessed/performed                Past Medical History:  Diagnosis Date   Acute bronchitis    Anemia    hx of anemia   Anginal pain (HCC)    Arthritis    osteo   Asthma    Chronic kidney disease (CKD), stage IV (severe) (HCC)    Coronary atherosclerosis    Cough    GERD (gastroesophageal reflux disease)    History of colon polyps    History of migraine    Hyperlipidemia    Hypertension    Obstructive sleep apnea (adult) (pediatric)    NO CPAP    Peripheral neuropathy    Type II or unspecified type diabetes mellitus without mention of complication, not stated as uncontrolled    type 2   Unspecified hypothyroidism    Past Surgical History:  Procedure Laterality Date   ANTERIOR FUSION CERVICAL SPINE  2009   CARPAL TUNNEL RELEASE Left    COLONOSCOPY     CORONARY STENT INTERVENTION  2007   LEFT HEART CATHETERIZATION WITH CORONARY ANGIOGRAM N/A 05/29/2014   Procedure: LEFT HEART CATHETERIZATION WITH CORONARY ANGIOGRAM;  Surgeon: Sinclair Grooms, MD;  Location: [facility] 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) 04/26/2020   Stomatitis 04/26/2020   Allergic reaction caused by a drug 04/26/2020   Rash    Left knee DJD 02/05/2020   Acute bronchitis 08/10/2014   Unstable angina ([place]) 05/29/2014   Abnormal nuclear stress test 05/29/2014   Chest pain 05/06/2014   Exertional shortness of breath 05/06/2014   Mixed hyperlipidemia 05/06/2014   Hyperlipidemia with target LDL less than 70 08/05/2013   HTN (hypertension) 08/05/2013   Chest wall pain 08/05/2013   SINUSITIS, ACUTE 04/07/2009   HYPOTHYROIDISM 12/10/2007   Type 2 diabetes mellitus with hemoglobin A1c goal of less than 7.0% ([place]) 05/28/2007   Obstructive sleep apnea 05/28/2007   Coronary atherosclerosis 05/28/2007   Seasonal and perennial allergic rhinitis 05/28/2007   COUGH, CHRONIC 05/28/2007    PCP: Reynold Bowen, MD  REFERRING PROVIDER: Susa Day, MD  REFERRING DIAG: M25.511 (ICD-10-CM) - Pain in right shoulder  THERAPY DIAG:  Chronic right shoulder pain  Abnormal posture  Muscle weakness (generalized)  Other symptoms and signs involving the musculoskeletal system  RATIONALE FOR EVALUATION AND TREATMENT:  Rehabilitation  ONSET DATE: chronic for ~5 yrs  SUBJECTIVE:                                                                                                                                                                                      SUBJECTIVE STATEMENT: Pt reports some soreness in shoulder today as she was planting flowers yesterday but mostly used R arm.  PAIN:  Are you having pain? Yes: NPRS scale: 3-4/10 Pain location: R anterior shoulder/pecs  Pain description:  tender  PERTINENT HISTORY: Recent R TKA (10/01/21) with delayed incisional healing, L TKA 02/05/20, ACDF 2009, OA, DM-II, COPD, HTN, CAD, stage 4 CKD, peripheral neuropathy  PRECAUTIONS: None  WEIGHT BEARING RESTRICTIONS No  FALLS:  Has patient fallen in last 6 months? No  LIVING ENVIRONMENT: Lives with:  sister Lives in: House/apartment Stairs: Yes: Internal: 14 steps; on right going up and External: 4 steps; on right going up, on left going up, and can reach both Has following equipment at home: Single point cane, Walker - 2 wheeled, and Wheelchair (manual)  OCCUPATION: Retired  PLOF: Independent and Leisure: goes to gym & walking around [place] "To have less pain and more ROM, including lifting more overhead w/o me screaming."  OBJECTIVE:   DIAGNOSTIC FINDINGS:  No recent imaging. Per pt, imaging several years ago revealed RTC tear.  PATIENT SURVEYS:  FOTO Shoulder = 59  COGNITION:  Overall cognitive status: Within functional limits for tasks assessed     SENSATION: WFL Except numbness in R 4th & 5th digits  POSTURE: Fwd head and rounded shoulders with protracted scapula R>L  UPPER EXTREMITY ROM:   Active ROM Right 12/14/2021 Left 12/14/2021 Right 01/11/22 Right  01/18/22  Shoulder flexion 152 - pain upon lowering 154 162 - pain upon lowering 164 No catch  Shoulder extension 47 67 68 68  Shoulder abduction 121 - pain upon lowering 146 151 - pain upon lowering 164 No catch  Shoulder adduction      Shoulder internal rotation 52 80 78 - pain 71 - pain FIR WFL  Shoulder external rotation 48 79 84 90  Elbow flexion      Elbow extension      Wrist flexion      Wrist extension      Wrist ulnar deviation      Wrist radial deviation      Wrist pronation      Wrist supination      (Blank  rows = not tested)  12/14/21 - R shoulder PROM WNL w/o pain except IR mildly limited with painful catch 01/11/22 - IR/ER measured in supine with shoulder at ~45  ABD/scaption  UPPER EXTREMITY MMT:  MMT Right* 12/14/2021 Left 12/14/2021 Right* 01/11/2022 Left 01/11/2022 Right* 01/18/2022 Left 01/18/2022  Shoulder flexion 3+ 4- 4- 4 4- 4  Shoulder extension 4- _0 4+ 4+  Shoulder abduction 3+ 4- 4- 4 4- 4  Shoulder adduction        Shoulder internal rotation 4- 4 4 4+ 4+ 4+  Shoulder external rotation 3 4- 3+ 4 4- 4  Middle trapezius        Lower trapezius        Elbow flexion        Elbow extension        Wrist flexion        Wrist extension        Wrist ulnar deviation        Wrist radial deviation        Wrist pronation        Wrist supination        Grip strength (lbs)        (Blank rows = not tested)  *Pain with all resisted motions on R except IR & extension  SHOULDER SPECIAL TESTS:  Impingement tests: Hawkins/Kennedy impingement test: positive   Rotator cuff assessment: Empty can test: positive  and Full can test: negative   JOINT MOBILITY TESTING:  R shoulder WFL  PALPATION:  Increased muscle tension and TTP in R pecs/anterior shoulder as well as posterior shoulder complex.   TODAY'S TREATMENT:  01/20/22 Therapeutic Exercise: UBE L3.0 x 6 min (3 min each fwd & back) Standing IR with red TB isometric step out 10x Tried ER isometric step out- pt unable to maintain correct elbow position Standing row with green TB 10x S/L R shld ER AROM 15x S/L R shld abduction 2# 10x Supine R shld flexion 1# 20 reps  01/18/22 THERAPEUTIC EXERCISE: to improve flexibility, strength and mobility.  Verbal and tactile cues throughout for technique. UBE L3.0 x 6 min (3 min each fwd & back) Standing RTB R shoulder IR (neutral shoulder) x 10 - cues to keep elbows tucked at side Standing RTB R shoulder ER (neutral shoulder) - attempted but unable to create AROM therefore reduced resistance to YTB but still unable to create AROM Standing YTB B shoulder ER (neutral shoulder) with back to doorframe to promote better scap retraction - attempted but  still unable to create AROM on R Standing R shoulder YTB ER isometric step-out x 8 - limited control with increased pain reported at medial scapula and anterior shoulder - able to resolve scapular pain with cues for scap retraction but still causing increased pain in anterior shoulder R shoulder self-resisted ER isometric with cues for scap retraction 10 x 5" - able to tolerate w/o increased pain (HEP modified to this version)  SELF CARE: Review of self-STM to R pecs and anterior shoulder using ball on wall   01/13/22 Therapeutic Exercise: UBE L3.0 x 8 min (4 min each fwd & back) Hooklying shoulder flexion 20x 1# Hooklying shoulder scaption 20x 1# S/L R shoulder abduction with 1# weight 20 reps S/L horizontal abduction 10 reps Scapular depression with 5# on lat pull machine in standing  Serratus punches on chest press machine 10# x 20   Modalities: Vasopneumatic: To R shoulder post session- 10 min, 34 deg, low compression  PATIENT EDUCATION:  Education details: HEP update  Person educated: Patient Education method: Explanation, Demonstration, and Handouts Education comprehension: verbalized understanding and verbal cues required   HOME EXERCISE PROGRAM: Access Code: [email or web address]  Exercises - Doorway Pec Stretch at 60 Degrees Abduction with Arm Straight  - 2-3 x daily - 7 x weekly - 3 reps - 30 sec hold - Single Arm Doorway Pec Stretch at 90 Degrees Abduction  - 2-3 x daily - 7 x weekly - 3 reps - 30 sec hold - Single Arm Doorway Pec Stretch at 120 Degrees Abduction  - 2-3 x daily - 7 x weekly - 3 reps - 30 sec hold - Seated Gentle Upper Trapezius Stretch  - 2-3 x daily - 7 x weekly - 3 reps - 30 sec hold - Gentle Levator Scapulae Stretch (Mirrored)  - 2-3 x daily - 7 x weekly - 3 reps - 30 sec hold - Seated Scapular Retraction  - 2-3 x daily - 7 x weekly - 2 sets - 10 reps - 5 sec hold - Standing Bilateral Low Shoulder Row with Anchored Resistance  - 1 x daily - 7 x weekly - 2  sets - 10 reps - Shoulder Flexion Wall Slide with Towel  - 1 x daily - 7 x weekly - 2 sets - 10 reps - Shoulder Internal Rotation with Resistance  - 1 x daily - 3 x weekly - 3 sets - 10 reps - Isometric Shoulder External Rotation (Mirrored)  - 1 x daily - 7 x weekly - 2 sets - 10 reps - 5 sec hold   ASSESSMENT:  CLINICAL IMPRESSION: Progressed HEP today, added S/L shld abduction and ER to work on movements away from the body. Pt was still unable to keep proper position of shoulder with isometric ER step out so deferred again. Pt shows greater ability to keep depression and retraction of scapula with exercise and rest. Also provided progressions of rows and isometric IR with resistance bands.  OBJECTIVE IMPAIRMENTS decreased activity tolerance, decreased knowledge of condition, decreased ROM, decreased strength, increased fascial restrictions, impaired perceived functional ability, increased muscle spasms, impaired flexibility, impaired UE functional use, improper body mechanics, postural dysfunction, and pain.   ACTIVITY LIMITATIONS cleaning, community activity, driving, meal prep, laundry, yard work, shopping, and church.   PERSONAL FACTORS Age, Past/current experiences, Time since onset of injury/illness/exacerbation, and 3+ comorbidities: Recent R TKA (10/01/21) with delayed incisional healing, L TKA 02/05/20, ACDF 2009, OA, DM-II, COPD, HTN, CAD, stage 4 CKD, peripheral neuropathy are also affecting patient's functional outcome.   REHAB POTENTIAL: Good  CLINICAL DECISION MAKING: Stable/uncomplicated  EVALUATION COMPLEXITY: Low   GOALS: Goals reviewed with patient? Yes  SHORT TERM GOALS: Target date: 01/06/2022    Patient will be independent with initial HEP Goal status: MET (01/05/22)  2.  Improve posture and alignment with patient to demonstrate improved upright posture with posterior shoulder girdle engaged Goal status: MET (01/07/22)   LONG TERM GOALS: Target date: 01/27/2022     Patient will be independent with ongoing/advanced HEP for self-management at home Goal status: IN PROGRESS  2.  Patient to demonstrate appropriate posture and body mechanics needed for daily activities Goal status: MET  (01/18/22)  3.  Decrease pain in the R shoulder girdle by 50-75% allowing patient to use R UE for functional activities Goal status: MET  (01/18/22 - Pt reporting 90% improvement in her pain)  4.  Patient to improve R shoulder AROM to [facility] without pain provocation Goal  status: MET  (01/18/22)  5.  Patient will demonstrate improved R shoulder strength to >/= 4 to 4+/5 for functional UE use Goal status: IN PROGRESS  (01/18/22 - Partially met for extension & IR, remaining motions 4-/5)  6.  Patient to report ability to perform ADLs, household, and leisure activities without limitation due to R shoulder pain, LOM or weakness Goal status: MET  (01/18/22)  7.  Patient will improve shoulder FOTO to >/= 65 to demonstrate improving function Baseline: 59 Goal status: MET (01/18/22 - shoulder FOTO = 72)   PLAN: PT FREQUENCY: 2x/week  PT DURATION: 6 weeks  PLANNED INTERVENTIONS: Therapeutic exercises, Therapeutic activity, Neuromuscular re-education, Balance training, Gait training, Patient/Family education, Joint mobilization, Dry Needling, Electrical stimulation, Cryotherapy, Moist heat, Taping, Vasopneumatic device, Ultrasound, Ionotophoresis 27m/ml Dexamethasone, and Manual therapy  PLAN FOR NEXT SESSION: review & update HEP in prep for transition to HEP at end f current POC; progress postural/scapular strengthening; gentle R shoulder ROM & stretching; isometric RTC strengthening progressing to light TB resistance as pain allows; MT and modalities to address abnormal muscle tension PRN    BArtist Pais PTA 01/20/2022, 12:03 PM

## 2022-01-24 NOTE — Therapy (Signed)
OUTPATIENT PHYSICAL THERAPY TREATMENT NOTE AND DISCHARGE SUMMARY  Patient [name] MRN: [id number] DOB:[birth year], 66 y.o., female Today's Date: 01/25/2022      PT End of Session - 01/25/22 1255     Visit Number 12    Number of Visits 12    Date for PT Re-Evaluation 01/27/22    Authorization Type [employer] MCR    PT Start Time 1300    PT Stop Time 6147    PT Time Calculation (min) 49 min    Activity Tolerance Patient tolerated treatment well    Behavior During Therapy WFL for tasks assessed/performed                 Past Medical History:  Diagnosis Date   Acute bronchitis    Anemia    hx of anemia   Anginal pain (HCC)    Arthritis    osteo   Asthma    Chronic kidney disease (CKD), stage IV (severe) (HCC)    Coronary atherosclerosis    Cough    GERD (gastroesophageal reflux disease)    History of colon polyps    History of migraine    Hyperlipidemia    Hypertension    Obstructive sleep apnea (adult) (pediatric)    NO CPAP    Peripheral neuropathy    Type II or unspecified type diabetes mellitus without mention of complication, not stated as uncontrolled    type 2   Unspecified hypothyroidism    Past Surgical History:  Procedure Laterality Date   ANTERIOR FUSION CERVICAL SPINE  2009   CARPAL TUNNEL RELEASE Left    COLONOSCOPY     CORONARY STENT INTERVENTION  2007   LEFT HEART CATHETERIZATION WITH CORONARY ANGIOGRAM N/A 05/29/2014   Procedure: LEFT HEART CATHETERIZATION WITH CORONARY ANGIOGRAM;  Surgeon: Sinclair Grooms, MD;  Location: [facility] CATH LAB;  Service: Cardiovascular;  Laterality: N/A;   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   Procedure: TOTAL KNEE ARTHROPLASTY;  Surgeon: Susa Day, MD;  Location: [facility] ORS;  Service: Orthopedics;  Laterality: Left;  3 hrs   TOTAL KNEE ARTHROPLASTY Right 10/01/2021   Procedure: TOTAL KNEE ARTHROPLASTY;  Surgeon: Susa Day, MD;  Location: [facility];  Service: Orthopedics;  Laterality: Right;   TUBAL  LIGATION  1990   WISDOM TOOTH EXTRACTION     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   Severe persistent asthma 10/05/2020   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   Allergic reaction 04/26/2020   Acute renal failure superimposed on stage 3 chronic kidney disease ([place]) 04/26/2020   Stomatitis 04/26/2020   Allergic reaction caused by a drug 04/26/2020   Rash    Left knee DJD 02/05/2020   Acute bronchitis 08/10/2014   Unstable angina ([place]) 05/29/2014   Abnormal nuclear stress test 05/29/2014   Chest pain 05/06/2014   Exertional shortness of breath 05/06/2014   Mixed hyperlipidemia 05/06/2014   Hyperlipidemia with target LDL less than 70 08/05/2013   HTN (hypertension) 08/05/2013   Chest wall pain 08/05/2013   SINUSITIS, ACUTE 04/07/2009   HYPOTHYROIDISM 12/10/2007   Type 2 diabetes mellitus with hemoglobin A1c goal of less than 7.0% ([place]) 05/28/2007   Obstructive sleep apnea 05/28/2007   Coronary atherosclerosis 05/28/2007   Seasonal and perennial allergic rhinitis 05/28/2007   COUGH, CHRONIC 05/28/2007    PCP: Reynold Bowen, MD  REFERRING PROVIDER: Susa Day, MD  REFERRING DIAG: M25.511 (ICD-10-CM) - Pain in right shoulder  THERAPY DIAG:  Chronic right shoulder pain  Abnormal posture  Other symptoms and signs involving the musculoskeletal system  RATIONALE FOR EVALUATION AND TREATMENT:  Rehabilitation  ONSET DATE: chronic for ~5 yrs  SUBJECTIVE:                                                                                                                                                                                      SUBJECTIVE STATEMENT: Patient reporting no pain in the right shoulder and I can sleep on it again. The left shoulder is hurting now.  PAIN:  Are you having pain? No Yes: NPRS scale: 0/10 Pain location: R anterior shoulder/pecs  Pain description: tender  PERTINENT HISTORY: Recent R TKA  (10/01/21) with delayed incisional healing, L TKA 02/05/20, ACDF 2009, OA, DM-II, COPD, HTN, CAD, stage 4 CKD, peripheral neuropathy  PRECAUTIONS: None  WEIGHT BEARING RESTRICTIONS No  FALLS:  Has patient fallen in last 6 months? No  LIVING ENVIRONMENT: Lives with:  sister Lives in: House/apartment Stairs: Yes: Internal: 14 steps; on right going up and External: 4 steps; on right going up, on left going up, and can reach both Has following equipment at home: Single point cane, Walker - 2 wheeled, and Wheelchair (manual)  OCCUPATION: Retired  PLOF: Independent and Leisure: goes to gym & walking around [place] "To have less pain and more ROM, including lifting more overhead w/o me screaming."  OBJECTIVE:   DIAGNOSTIC FINDINGS:  No recent imaging. Per pt, imaging several years ago revealed RTC tear.  PATIENT SURVEYS:  FOTO Shoulder = 59  COGNITION:  Overall cognitive status: Within functional limits for tasks assessed     SENSATION: WFL Except numbness in R 4th & 5th digits  POSTURE: Fwd head and rounded shoulders with protracted scapula R>L  UPPER EXTREMITY ROM:   Active ROM Right 12/14/2021 Left 12/14/2021 Right 01/11/22 Right  01/18/22  Shoulder flexion 152 - pain upon lowering 154 162 - pain upon lowering 164 No catch  Shoulder extension 47 67 68 68  Shoulder abduction 121 - pain upon lowering 146 151 - pain upon lowering 164 No catch  Shoulder adduction      Shoulder internal rotation 52 80 78 - pain 71 - pain FIR WFL  Shoulder external rotation 48 79 84 90  Elbow flexion      Elbow extension      Wrist flexion      Wrist extension      Wrist ulnar deviation      Wrist radial deviation      Wrist pronation      Wrist supination      (Blank  rows = not tested)  12/14/21 - R shoulder PROM WNL w/o pain except IR mildly limited with painful catch 01/11/22 - IR/ER measured in supine with shoulder at ~45 ABD/scaption  UPPER EXTREMITY MMT:  MMT  Right* 12/14/2021 Left 12/14/2021 Right* 01/11/2022 Left 01/11/2022 Right* 01/18/2022 Left 01/18/2022 Right 01/25/22  Shoulder flexion 3+ 4- 4- 4 4- 4 5  Shoulder extension 4- 4 4 4  4+ 4+ 5  Shoulder abduction 3+ 4- 4- 4 4- 4 4+ mild pain  Shoulder adduction         Shoulder internal rotation 4- 4 4 4+ 4+ 4+ 4+  Shoulder external rotation 3 4- 3+ 4 4- 4 4-  Middle trapezius         Lower trapezius         Elbow flexion       4+  Elbow extension         Wrist flexion         Wrist extension         Wrist ulnar deviation         Wrist radial deviation         Wrist pronation         Wrist supination         Grip strength (lbs)         (Blank rows = not tested)  *Pain with all resisted motions on R except IR & extension  SHOULDER SPECIAL TESTS:  Impingement tests: Hawkins/Kennedy impingement test: positive   Rotator cuff assessment: Empty can test: positive  and Full can test: negative   JOINT MOBILITY TESTING:  R shoulder WFL  PALPATION:  Increased muscle tension and TTP in R pecs/anterior shoulder as well as posterior shoulder complex.   TODAY'S TREATMENT:  01/25/22 Therapeutic Exercise: UBE L3.0 x 7 min (3.5 min each fwd & back) Standing IR with red TB isometric step out 10x ER isometric red band 5 sec hold x 10  Standing row with green TB 15x Standing Ext with GTB 15x Seated biceps curl RTB x 5 from floor; 10 reps with resistance from knee level (holding with other hand) S/L R shld ER AROM 15x S/L R shld abduction 2# 10x Supine R shld flexion 1# 20 reps Supine R shld scaption 1# 20 reps Vaso x 5 min to R shoulder  01/20/22 Therapeutic Exercise: UBE L3.0 x 6 min (3 min each fwd & back) Standing IR with red TB isometric step out 10x Tried ER isometric step out- pt unable to maintain correct elbow position Standing row with green TB 10x S/L R shld ER AROM 15x S/L R shld abduction 2# 6x then started to hurt so used 1# x  Supine R shld flexion 1# 20  reps  01/18/22 THERAPEUTIC EXERCISE: to improve flexibility, strength and mobility.  Verbal and tactile cues throughout for technique. UBE L3.0 x 6 min (3 min each fwd & back) Standing RTB R shoulder IR (neutral shoulder) x 10 - cues to keep elbows tucked at side Standing RTB R shoulder ER (neutral shoulder) - attempted but unable to create AROM therefore reduced resistance to YTB but still unable to create AROM Standing YTB B shoulder ER (neutral shoulder) with back to doorframe to promote better scap retraction - attempted but still unable to create AROM on R Standing R shoulder YTB ER isometric step-out x 8 - limited control with increased pain reported at medial scapula and anterior shoulder - able to resolve scapular pain with cues for  scap retraction but still causing increased pain in anterior shoulder R shoulder self-resisted ER isometric with cues for scap retraction 10 x 5" - able to tolerate w/o increased pain (HEP modified to this version)  SELF CARE: Review of self-STM to R pecs and anterior shoulder using ball on wall  PATIENT EDUCATION:  Education details:  HEP reviewed  Person educated: Patient Education method: Consulting civil engineer, Demonstration, and Handouts Education comprehension: verbalized understanding and verbal cues required   HOME EXERCISE PROGRAM: Access Code: [email or web address]  Exercises - Systems analyst at 60 Degrees Abduction with Arm Straight  - 2-3 x daily - 7 x weekly - 3 reps - 30 sec hold - Single Arm Doorway Pec Stretch at 90 Degrees Abduction  - 2-3 x daily - 7 x weekly - 3 reps - 30 sec hold - Single Arm Doorway Pec Stretch at 120 Degrees Abduction  - 2-3 x daily - 7 x weekly - 3 reps - 30 sec hold - Seated Gentle Upper Trapezius Stretch  - 2-3 x daily - 7 x weekly - 3 reps - 30 sec hold - Gentle Levator Scapulae Stretch (Mirrored)  - 2-3 x daily - 7 x weekly - 3 reps - 30 sec hold - Seated Scapular Retraction  - 2-3 x daily - 7 x weekly - 2 sets - 10 reps - 5  sec hold - Standing Bilateral Low Shoulder Row with Anchored Resistance  - 1 x daily - 7 x weekly - 2 sets - 10 reps - Shoulder Flexion Wall Slide with Towel  - 1 x daily - 7 x weekly - 2 sets - 10 reps - Shoulder Internal Rotation with Resistance  - 1 x daily - 3 x weekly - 3 sets - 10 reps - Isometric Shoulder External Rotation (Mirrored)  - 1 x daily - 7 x weekly - 2 sets - 10 reps - 5 sec hold   ASSESSMENT:  CLINICAL IMPRESSION: Asa Lente has met her LTGs except for R shoulder ER strength which is still at 4-/5. She demonstrated increased strength compared to last week. Testing was done after TE today. She reports that she still has difficulty lifting her skillet, so biceps strengthening was added to HEP today. She is pleased with her current level of function and will be discharged with her HEP.   OBJECTIVE IMPAIRMENTS decreased activity tolerance, decreased knowledge of condition, decreased ROM, decreased strength, increased fascial restrictions, impaired perceived functional ability, increased muscle spasms, impaired flexibility, impaired UE functional use, improper body mechanics, postural dysfunction, and pain.   ACTIVITY LIMITATIONS cleaning, community activity, driving, meal prep, laundry, yard work, shopping, and church.   PERSONAL FACTORS Age, Past/current experiences, Time since onset of injury/illness/exacerbation, and 3+ comorbidities: Recent R TKA (10/01/21) with delayed incisional healing, L TKA 02/05/20, ACDF 2009, OA, DM-II, COPD, HTN, CAD, stage 4 CKD, peripheral neuropathy are also affecting patient's functional outcome.   GOALS: Goals reviewed with patient? Yes  SHORT TERM GOALS: Target date: 01/06/2022    Patient will be independent with initial HEP Goal status: MET (01/05/22)  2.  Improve posture and alignment with patient to demonstrate improved upright posture with posterior shoulder girdle engaged Goal status: MET (01/07/22)   LONG TERM GOALS: Target date: 01/27/2022     Patient will be independent with ongoing/advanced HEP for self-management at home Goal status: MET  2.  Patient to demonstrate appropriate posture and body mechanics needed for daily activities Goal status: MET  (01/18/22)  3.  Decrease pain in the  R shoulder girdle by 50-75% allowing patient to use R UE for functional activities Goal status: MET  (01/18/22 - Pt reporting 90% improvement in her pain)  4.  Patient to improve R shoulder AROM to [place] without pain provocation Goal status: MET  (01/18/22)  5.  Patient will demonstrate improved R shoulder strength to >/= 4 to 4+/5 for functional UE use Goal status: PARTIALLY MET  ER 4-/5+ (01/25/22) 6.  Patient to report ability to perform ADLs, household, and leisure activities without limitation due to R shoulder pain, LOM or weakness Goal status: MET  (01/18/22)  7.  Patient will improve shoulder FOTO to >/= 65 to demonstrate improving function Baseline: 59 Goal status: MET (01/18/22 - shoulder FOTO = 72)   PLAN: PT FREQUENCY: 2x/week  PT DURATION: 6 weeks  PLANNED INTERVENTIONS: Therapeutic exercises, Therapeutic activity, Neuromuscular re-education, Balance training, Gait training, Patient/Family education, Joint mobilization, Dry Needling, Electrical stimulation, Cryotherapy, Moist heat, Taping, Vasopneumatic device, Ultrasound, Ionotophoresis 38m/ml Dexamethasone, and Manual therapy  PLAN FOR NEXT SESSION: See d/c summary    Damiya Sandefur, PT 01/25/2022, 2:04 PM   PHYSICAL THERAPY DISCHARGE SUMMARY  Visits from Start of Care: 12  Current functional level related to goals / functional outcomes: See above   Remaining deficits: See above   Education / Equipment: HEP   Patient agrees to discharge. Patient goals were partially met. Patient is being discharged due to being pleased with the current functional level.  JMadelyn Flavors PT 01/25/22 2:06 PM

## 2022-01-25 ENCOUNTER — Encounter: Payer: Self-pay | Admitting: Physical Therapy

## 2022-01-25 ENCOUNTER — Ambulatory Visit: Payer: Medicare Other | Admitting: Physical Therapy

## 2022-01-25 DIAGNOSIS — R29898 Other symptoms and signs involving the musculoskeletal system: Secondary | ICD-10-CM

## 2022-01-25 DIAGNOSIS — G8929 Other chronic pain: Secondary | ICD-10-CM

## 2022-01-25 DIAGNOSIS — R293 Abnormal posture: Secondary | ICD-10-CM

## 2022-01-25 DIAGNOSIS — M25511 Pain in right shoulder: Secondary | ICD-10-CM | POA: Diagnosis not present

## 2022-01-27 ENCOUNTER — Ambulatory Visit: Payer: Medicare Other | Admitting: Physical Therapy

## 2022-02-05 ENCOUNTER — Other Ambulatory Visit: Payer: Self-pay | Admitting: Pulmonary Disease

## 2022-02-05 DIAGNOSIS — J4551 Severe persistent asthma with (acute) exacerbation: Secondary | ICD-10-CM

## 2022-02-05 DIAGNOSIS — J309 Allergic rhinitis, unspecified: Secondary | ICD-10-CM

## 2022-02-11 ENCOUNTER — Other Ambulatory Visit (HOSPITAL_COMMUNITY): Payer: Self-pay

## 2022-02-15 ENCOUNTER — Other Ambulatory Visit (HOSPITAL_COMMUNITY): Payer: Self-pay

## 2022-02-23 ENCOUNTER — Other Ambulatory Visit: Payer: Self-pay | Admitting: Nurse Practitioner

## 2022-03-14 ENCOUNTER — Other Ambulatory Visit (HOSPITAL_COMMUNITY): Payer: Self-pay

## 2022-03-21 ENCOUNTER — Other Ambulatory Visit (HOSPITAL_COMMUNITY): Payer: Self-pay

## 2022-03-23 ENCOUNTER — Other Ambulatory Visit: Payer: Self-pay | Admitting: Nurse Practitioner

## 2022-03-26 ENCOUNTER — Other Ambulatory Visit: Payer: Self-pay | Admitting: Cardiovascular Disease

## 2022-04-12 ENCOUNTER — Telehealth: Payer: Self-pay | Admitting: Pulmonary Disease

## 2022-04-12 ENCOUNTER — Other Ambulatory Visit (HOSPITAL_COMMUNITY): Payer: Self-pay

## 2022-04-12 DIAGNOSIS — J4551 Severe persistent asthma with (acute) exacerbation: Secondary | ICD-10-CM

## 2022-04-12 MED ORDER — NUCALA 100 MG/ML ~~LOC~~ SOAJ
100.0000 mg | SUBCUTANEOUS | 0 refills | Status: DC
  Filled 2022-04-12: qty 1, 28d supply, fill #0

## 2022-04-12 NOTE — Telephone Encounter (Signed)
One refill for Nucala sent to [facility]. Further refills will not be able to be sent until pt is seen in office  Patient has not been seen in a year. Routing to scheduling team  Knox Saliva, PharmD, MPH, BCPS, CPP Clinical Pharmacist (Rheumatology and Pulmonology)

## 2022-04-13 ENCOUNTER — Other Ambulatory Visit (HOSPITAL_COMMUNITY): Payer: Self-pay

## 2022-04-13 ENCOUNTER — Ambulatory Visit (INDEPENDENT_AMBULATORY_CARE_PROVIDER_SITE_OTHER): Payer: Medicare Other | Admitting: Pulmonary Disease

## 2022-04-13 ENCOUNTER — Encounter: Payer: Self-pay | Admitting: Pulmonary Disease

## 2022-04-13 DIAGNOSIS — J4551 Severe persistent asthma with (acute) exacerbation: Secondary | ICD-10-CM

## 2022-04-13 NOTE — Patient Instructions (Signed)
I will see you back in 6 months  Continue using your current medications  Call us with any concerns

## 2022-04-13 NOTE — Progress Notes (Signed)
Synopsis: Referred in January 2021 for SOB by Reynold Bowen, MD.   Subjective:   PATIENT ID: [name] GENDER: female DOB: 16-Feb-1956, MRN: [id number]  Patient with a history of severe persistent asthma Has been on Nucala for about a year  She feels she is benefiting significantly from the [place] Has not had any recent exacerbation Has not had to use steroids recently  Currently on Advair twice a day  Was recently sick with Stevens-Johnson syndrome, hospitalized for few days in the past  Does have an occasional cough, minimal phlegm production  She does need to use albuterol Has not needed her nebulizer recently  Denies any pain or discomfort  She was on Advair in the past and Breo   She has a history of allergic rhinosinusitis, sinus drainage 2 weeks Flonase, Singulair  Not feeling acutely ill at present  Past Medical History:  Diagnosis Date   Acute bronchitis    Anemia    hx of anemia   Anginal pain (HCC)    Arthritis    osteo   Asthma    Chronic kidney disease (CKD), stage IV (severe) (HCC)    Coronary atherosclerosis    Cough    GERD (gastroesophageal reflux disease)    History of colon polyps    History of migraine    Hyperlipidemia    Hypertension    Obstructive sleep apnea (adult) (pediatric)    NO CPAP    Peripheral neuropathy    Type II or unspecified type diabetes mellitus without mention of complication, not stated as uncontrolled    type 2   Unspecified hypothyroidism      Family History  Problem Relation Age of Onset   Hypertension Mother    Heart disease Mother    Stroke Mother    Prostate cancer Father    Heart disease Brother    Heart attack Sister      Past Surgical History:  Procedure Laterality Date   ANTERIOR FUSION CERVICAL SPINE  2009   CARPAL TUNNEL RELEASE Left    COLONOSCOPY     CORONARY STENT INTERVENTION  2007   LEFT HEART CATHETERIZATION WITH CORONARY ANGIOGRAM N/A 05/29/2014   Procedure: LEFT HEART  CATHETERIZATION WITH CORONARY ANGIOGRAM;  Surgeon: Sinclair Grooms, MD;  Location: [facility] CATH LAB;  Service: Cardiovascular;  Laterality: N/A;   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   Procedure: TOTAL KNEE ARTHROPLASTY;  Surgeon: Susa Day, MD;  Location: [facility] ORS;  Service: Orthopedics;  Laterality: Left;  3 hrs   TOTAL KNEE ARTHROPLASTY Right 10/01/2021   Procedure: TOTAL KNEE ARTHROPLASTY;  Surgeon: Susa Day, MD;  Location: [facility];  Service: Orthopedics;  Laterality: Right;   TUBAL LIGATION  1990   WISDOM TOOTH EXTRACTION      Social History   Socioeconomic History   Marital status: Widowed    Spouse name: Not on file   Number of children: 0   Years of education: Not on file   Highest education level: Not on file  Occupational History   Occupation: retired  Tobacco Use   Smoking status: Former    Packs/day: 0.50    Years: 25.00    Total pack years: 12.50    Types: Cigarettes    Quit date: 08/09/2003    Years since quitting: 18.6   Smokeless tobacco: Never  Vaping Use   Vaping Use: Never used  Substance and Sexual Activity   Alcohol use: No    Alcohol/week: 0.0 standard drinks of  alcohol   Drug use: No   Sexual activity: Not on file  Other Topics Concern   Not on file  Social History Narrative   Not on file   Social Determinants of Health   Financial Resource Strain: Not on file  Food Insecurity: Not on file  Transportation Needs: Not on file  Physical Activity: Not on file  Stress: Not on file  Social Connections: Not on file  Intimate Partner Violence: Not on file     Allergies  Allergen Reactions   Allopurinol Rash    Toxic epidermal necrolysis    Codeine Other (See Comments)    REACTION: hallucinations/"loopy"   Levaquin [Levofloxacin] Nausea And Vomiting   Sulfa Antibiotics Hives   Sulfonamide Derivatives Hives     Immunization History  Administered Date(s) Administered   Fluad Quad(high Dose 65+) 06/05/2019   Influenza Split  05/10/2011, 05/09/2012, 05/31/2013   Influenza Whole 05/08/2010, 05/26/2020   Influenza,inj,Quad PF,6+ Mos 09/14/2015, 04/19/2016, 04/24/2017   Influenza-Unspecified 04/22/2010, 08/08/2012, 06/04/2014, 05/14/2020   PFIZER(Purple Top)SARS-COV-2 Vaccination 10/03/2019, 10/25/2019   Pneumococcal Polysaccharide-23 08/09/2007   Td 08/08/2008    Outpatient Medications Prior to Visit  Medication Sig Dispense Refill   albuterol (PROVENTIL HFA) 108 (90 Base) MCG/ACT inhaler Inhale 2 puffs into the lungs every 6 (six) hours as needed for wheezing or shortness of breath. For shortness of breath and wheezing 18 each 2   Ascorbic Acid (VITAMIN C PO) Take 1 tablet by mouth daily.     aspirin EC 81 MG tablet Take 1 tablet (81 mg total) by mouth 2 (two) times daily after a meal. Day after surgery 60 tablet 1   BIOTIN PO Take 500 mg by mouth daily.     Cyanocobalamin (B-12 PO) Take 1,000 mcg by mouth daily.     Dexlansoprazole 30 MG capsule DR Take 30 mg by mouth daily.     diazepam (VALIUM) 5 MG tablet Take 5 mg by mouth daily as needed for muscle spasms (cramps).     docusate sodium (COLACE) 100 MG capsule Take 1 capsule (100 mg total) by mouth 2 (two) times daily as needed for mild constipation. 30 capsule 1   ferrous sulfate 325 (65 FE) MG tablet Take 325 mg by mouth daily with breakfast.     Finerenone (KERENDIA) 10 MG TABS Take 10 mg by mouth daily.     fluticasone (FLONASE) 50 MCG/ACT nasal spray Place 2 sprays into both nostrils daily. (Patient taking differently: Place 2 sprays into both nostrils daily as needed for allergies.) 18.2 mL 11   fluticasone-salmeterol (ADVAIR HFA) 230-21 MCG/ACT inhaler Inhale 2 puffs into the lungs 2 (two) times daily. 8 g 6   furosemide (LASIX) 40 MG tablet Take 1 tablet (40 mg total) by mouth daily. In the morning (Patient taking differently: Take 40-60 mg by mouth See admin instructions. Take 60 mg in the morning and 20 mg bedtime) 90 tablet 3   hydrocortisone 2.5 %  cream Apply topically 2 (two) times daily. Apply as needed two times daily 30 g 0   insulin lispro (HUMALOG) 100 UNIT/ML KwikPen Inject 5-10 Units into the skin See admin instructions. Inject 5 units subcutaneously up to three times daily if CBG >200, inject 10 units up to three times daily if CBG >300     ipratropium-albuterol (DUONEB) 0.5-2.5 (3) MG/3ML SOLN Take 3 mLs by nebulization every 4 (four) hours as needed. 360 mL 11   isosorbide mononitrate (IMDUR) 30 MG 24 hr tablet Take 1  tablet (30 mg total) by mouth daily. 30 tablet 6   JARDIANCE 10 MG TABS tablet Take 10 mg by mouth daily.     Lancets (ONETOUCH DELICA PLUS [id number]) MISC Apply 1 each topically 4 (four) times daily.     levothyroxine (SYNTHROID) 150 MCG tablet Take 150 mcg by mouth daily before breakfast.     Mepolizumab (NUCALA) 100 MG/ML SOAJ Inject 1 mL (100 mg total) into the skin every 28 (twenty-eight) days. NEED APPOINTMENT FOR FURTHER REFILLS 1 mL 0   methocarbamol (ROBAXIN) 500 MG tablet Take 1 tablet (500 mg total) by mouth every 8 (eight) hours as needed for muscle spasms. 40 tablet 1   metoprolol succinate (TOPROL-XL) 25 MG 24 hr tablet TAKE 1 AND 1/2 TABLET BY MOUTH DAILY 45 tablet 3   montelukast (SINGULAIR) 10 MG tablet Take 1 tablet (10 mg total) by mouth at bedtime. 30 tablet 11   Multiple Vitamin (MULTIVITAMIN WITH MINERALS) TABS tablet Take 1 tablet by mouth daily.     nitroGLYCERIN (NITROLINGUAL) 0.4 MG/SPRAY spray Place 1 spray under the tongue every 5 (five) minutes x 3 doses as needed for chest pain. 12 g 3   NOVOFINE PLUS PEN NEEDLE 32G X 4 MM MISC Inject into the skin.     ONE TOUCH ULTRA TEST test strip Use as directed     oxyCODONE (OXY IR/ROXICODONE) 5 MG immediate release tablet Take 1 tablet (5 mg total) by mouth every 4 (four) hours as needed for severe pain. 40 tablet 0   polyethylene glycol (MIRALAX / GLYCOLAX) 17 g packet Take 17 g by mouth daily. 14 each 0   potassium chloride (KLOR-CON M) 10 MEQ  tablet TAKE ONE TABLET BY MOUTH DAILY 90 tablet 3   pregabalin (LYRICA) 100 MG capsule Take 100 mg by mouth 2 (two) times daily.     Pyridoxine HCl (B-6 PO) Take 1 tablet by mouth daily.     ranolazine (RANEXA) 500 MG 12 hr tablet Take 1 tablet (500 mg total) by mouth 2 (two) times daily. 180 tablet 3   Respiratory Therapy Supplies (FLUTTER) DEVI 1 puff by Does not apply route daily. 1 each 0   rosuvastatin (CRESTOR) 20 MG tablet Take 20 mg by mouth daily.     tirzepatide [facility]) 5 MG/0.5ML Pen Inject 5 mg into the skin once a week.     traMADol (ULTRAM) 50 MG tablet Take 50 mg by mouth 3 (three) times daily as needed for pain.     triamterene-hydrochlorothiazide (MAXZIDE-25) 37.5-25 MG tablet TAKE ONE TABLET BY MOUTH DAILY 90 tablet 3   verapamil (CALAN-SR) 120 MG CR tablet Take 120 mg by mouth at bedtime.     XIIDRA 5 % SOLN      No facility-administered medications prior to visit.    Review of Systems  Constitutional:  Negative for chills, fever and weight loss.  HENT:  Negative for congestion.        Postnasal drip  Respiratory:  Positive for shortness of breath. Negative for cough and wheezing.   Cardiovascular:  Negative for chest pain and leg swelling.  Gastrointestinal:  Negative for heartburn.  Endo/Heme/Allergies:  Positive for environmental allergies.     Objective:   Vitals:   04/13/22 1459  BP: 124/70  Pulse: (!) 59  SpO2: 99%  Weight: 157 lb 3.2 oz (71.3 kg)  Height: 5\' 2"  (1.575 m)   99% on   RA BMI Readings from Last 3 Encounters:  04/13/22 28.75 kg/m  12/03/21 28.90 kg/m  11/19/21 28.50 kg/m   Wt Readings from Last 3 Encounters:  04/13/22 157 lb 3.2 oz (71.3 kg)  12/03/21 158 lb (71.7 kg)  11/19/21 155 lb 12.8 oz (70.7 kg)    Physical Exam Vitals reviewed.  Constitutional:      Appearance: Normal appearance. She is not ill-appearing.  HENT:     Head: Normocephalic and atraumatic.  Eyes:     General: No scleral icterus. Cardiovascular:      Rate and Rhythm: Normal rate and regular rhythm.     Heart sounds: No murmur heard. Pulmonary:     Effort: No respiratory distress.     Breath sounds: No stridor. No wheezing or rhonchi.  Abdominal:     General: There is no distension.  Musculoskeletal:     Cervical back: No rigidity or tenderness.  Lymphadenopathy:     Cervical: No cervical adenopathy.  Skin:    General: Skin is warm.  Neurological:     Mental Status: She is alert.     Motor: No weakness.     Coordination: Coordination normal.  Psychiatric:        Mood and Affect: Mood normal.        Behavior: Behavior normal.    CBC    Component Value Date/Time   WBC 6.9 11/15/2021 1539   RBC 4.54 11/15/2021 1539   HGB 11.1 (L) 11/15/2021 1539   HGB 10.9 (L) 03/28/2018 1031   HCT 36.2 11/15/2021 1539   HCT 35.4 03/28/2018 1031   PLT 307 11/15/2021 1539   PLT 312 03/28/2018 1031   MCV 79.7 (L) 11/15/2021 1539   MCV 77 (L) 03/28/2018 1031   MCH 24.4 (L) 11/15/2021 1539   MCHC 30.7 11/15/2021 1539   RDW 15.8 (H) 11/15/2021 1539   RDW 17.0 (H) 03/28/2018 1031   LYMPHSABS 1.6 11/15/2021 1539   MONOABS 0.7 11/15/2021 1539   EOSABS 0.1 11/15/2021 1539   BASOSABS 0.0 11/15/2021 1539    09/05/2019 labs: IgE 419 Eosinophils 800  02/17/2021-Chem-7 within normal limits-BUN 29, creatinine 1.9, 02/16/2021 CBC within normal limits  Chest Imaging- films reviewed: CXR, 2 view 12/08/2015- C-spine hardware, otherwise normal No recent chest x-ray available  Pulmonary Functions Testing Results:    Latest Ref Rng & Units 12/22/2015    1:49 PM  PFT Results  FVC-Pre L 2.60   FVC-Predicted Pre % 106   FVC-Post L 2.48   FVC-Predicted Post % 102   Pre FEV1/FVC % % 72   Post FEV1/FCV % % 67   FEV1-Pre L 1.87   FEV1-Predicted Pre % 97   FEV1-Post L 1.67   DLCO uncorrected ml/min/mmHg 13.06   DLCO UNC% % 60   DLVA Predicted % 66   TLC L 4.76   TLC % Predicted % 100   RV % Predicted % 118    2017- FVC 3.0 (97%) 3.18 (102%,  +6%)  FEV1  2.05 (87%) 2.14 (91%, +5%) Ratio 68 TLC 106% RV 119% DLCO 62%    Echocardiogram 04/23/2019: LVEF 60 to 65%, mild LVH.  Normal diastolic function.  Normal LA, RV, RA.  Mild AI, otherwise normal valves.     Assessment & Plan:   Severe persistent asthma with elevated IgE and eosinophils -On Nucala -Appears to be tolerating Nucala well with no recent exacerbations -Nasal steroids and antihistamines for allergies -Continue Advair HFA  Chronic rhinosinusitis -Continue nasal steroids -Symptoms have been stable recently  History of GERD -Continue antireflux medications  History of  Katherina Right syndrome -Breathing remains stable -Encouraged to call with any significant concerns with regards to her breathing    Refill prescription for Nucala  Graded exercise as tolerated  Continue current inhalers  Encouraged to call us with significant concerns  Follow-up in 6 months

## 2022-04-13 NOTE — Telephone Encounter (Signed)
Appt today at 2:45pm with Dr. Jenetta Downer

## 2022-04-14 ENCOUNTER — Other Ambulatory Visit (HOSPITAL_COMMUNITY): Payer: Self-pay

## 2022-04-21 ENCOUNTER — Other Ambulatory Visit (HOSPITAL_COMMUNITY): Payer: Self-pay

## 2022-04-22 ENCOUNTER — Other Ambulatory Visit (HOSPITAL_COMMUNITY): Payer: Self-pay

## 2022-04-22 MED ORDER — NUCALA 100 MG/ML ~~LOC~~ SOAJ
100.0000 mg | SUBCUTANEOUS | 5 refills | Status: DC
  Filled 2022-04-22 – 2022-05-13 (×2): qty 1, 28d supply, fill #0
  Filled 2022-06-14: qty 1, 28d supply, fill #1
  Filled 2022-07-12 – 2022-07-20 (×5): qty 1, 28d supply, fill #2
  Filled 2022-08-12: qty 1, 28d supply, fill #3
  Filled 2022-09-13: qty 1, 28d supply, fill #4
  Filled 2022-10-12: qty 1, 28d supply, fill #5

## 2022-04-22 NOTE — Telephone Encounter (Signed)
Refill for Nucala for 6 months sent to [facility]. Closing encounter

## 2022-05-13 ENCOUNTER — Other Ambulatory Visit (HOSPITAL_COMMUNITY): Payer: Self-pay

## 2022-05-23 ENCOUNTER — Other Ambulatory Visit (HOSPITAL_COMMUNITY): Payer: Self-pay

## 2022-06-03 ENCOUNTER — Other Ambulatory Visit: Payer: Self-pay | Admitting: Cardiovascular Disease

## 2022-06-14 ENCOUNTER — Other Ambulatory Visit (HOSPITAL_COMMUNITY): Payer: Self-pay

## 2022-06-23 ENCOUNTER — Other Ambulatory Visit (HOSPITAL_COMMUNITY): Payer: Self-pay

## 2022-07-12 ENCOUNTER — Other Ambulatory Visit (HOSPITAL_COMMUNITY): Payer: Self-pay

## 2022-07-20 ENCOUNTER — Other Ambulatory Visit (HOSPITAL_COMMUNITY): Payer: Self-pay

## 2022-07-20 ENCOUNTER — Other Ambulatory Visit: Payer: Self-pay

## 2022-07-22 ENCOUNTER — Telehealth: Payer: Self-pay | Admitting: Pharmacist

## 2022-07-22 NOTE — Telephone Encounter (Signed)
Received notification from [facility] regarding a prior authorization for [place]. Authorization has been APPROVED from 07/22/22 to 08/08/2023. Approval letter sent to scan center.  Patient can continue to fill through [place]: [phone]   Authorization # [id number] Phone # [phone]  Knox Saliva, PharmD, MPH, BCPS, CPP Clinical Pharmacist (Rheumatology and Pulmonology)

## 2022-07-22 NOTE — Telephone Encounter (Signed)
Per [place], pt's Nucala PA set to expire. Submitted a Prior Authorization RENEWAL request to [facility] for [place] via CoverMyMeds. Will update once we receive a response.  Key: Janit Pagan, PharmD, MPH, BCPS, CPP Clinical Pharmacist (Rheumatology and Pulmonology)

## 2022-08-09 ENCOUNTER — Other Ambulatory Visit: Payer: Self-pay

## 2022-08-09 ENCOUNTER — Other Ambulatory Visit (HOSPITAL_COMMUNITY): Payer: Self-pay

## 2022-08-09 MED ORDER — METOPROLOL SUCCINATE ER 25 MG PO TB24: 37.5000 mg | ORAL_TABLET | Freq: Every day | ORAL | 3 refills | Status: DC

## 2022-08-12 ENCOUNTER — Other Ambulatory Visit (HOSPITAL_COMMUNITY): Payer: Self-pay

## 2022-08-17 ENCOUNTER — Other Ambulatory Visit: Payer: Self-pay

## 2022-08-18 ENCOUNTER — Other Ambulatory Visit (HOSPITAL_COMMUNITY): Payer: Self-pay

## 2022-08-27 ENCOUNTER — Other Ambulatory Visit: Payer: Self-pay | Admitting: Nurse Practitioner

## 2022-09-02 ENCOUNTER — Other Ambulatory Visit: Payer: Self-pay | Admitting: Pulmonary Disease

## 2022-09-02 DIAGNOSIS — J309 Allergic rhinitis, unspecified: Secondary | ICD-10-CM

## 2022-09-02 DIAGNOSIS — J4551 Severe persistent asthma with (acute) exacerbation: Secondary | ICD-10-CM

## 2022-09-13 ENCOUNTER — Other Ambulatory Visit (HOSPITAL_COMMUNITY): Payer: Self-pay

## 2022-09-19 ENCOUNTER — Other Ambulatory Visit: Payer: Self-pay

## 2022-10-12 ENCOUNTER — Other Ambulatory Visit (HOSPITAL_COMMUNITY): Payer: Self-pay

## 2022-10-18 ENCOUNTER — Other Ambulatory Visit (HOSPITAL_COMMUNITY): Payer: Self-pay

## 2022-11-11 ENCOUNTER — Other Ambulatory Visit (HOSPITAL_COMMUNITY): Payer: Self-pay

## 2022-11-11 ENCOUNTER — Other Ambulatory Visit: Payer: Self-pay | Admitting: Pulmonary Disease

## 2022-11-11 DIAGNOSIS — J4551 Severe persistent asthma with (acute) exacerbation: Secondary | ICD-10-CM

## 2022-11-14 ENCOUNTER — Other Ambulatory Visit (HOSPITAL_COMMUNITY): Payer: Self-pay

## 2022-11-14 ENCOUNTER — Ambulatory Visit: Payer: Medicare Other | Attending: Nurse Practitioner | Admitting: Nurse Practitioner

## 2022-11-14 ENCOUNTER — Encounter: Payer: Self-pay | Admitting: Nurse Practitioner

## 2022-11-14 ENCOUNTER — Telehealth: Payer: Self-pay | Admitting: Pulmonary Disease

## 2022-11-14 VITALS — BP 110/66 | HR 76 | Ht 62.0 in | Wt 155.0 lb

## 2022-11-14 DIAGNOSIS — G4733 Obstructive sleep apnea (adult) (pediatric): Secondary | ICD-10-CM | POA: Diagnosis not present

## 2022-11-14 DIAGNOSIS — E785 Hyperlipidemia, unspecified: Secondary | ICD-10-CM | POA: Diagnosis not present

## 2022-11-14 DIAGNOSIS — I951 Orthostatic hypotension: Secondary | ICD-10-CM

## 2022-11-14 DIAGNOSIS — I10 Essential (primary) hypertension: Secondary | ICD-10-CM | POA: Diagnosis not present

## 2022-11-14 DIAGNOSIS — J4551 Severe persistent asthma with (acute) exacerbation: Secondary | ICD-10-CM

## 2022-11-14 DIAGNOSIS — E118 Type 2 diabetes mellitus with unspecified complications: Secondary | ICD-10-CM

## 2022-11-14 DIAGNOSIS — N184 Chronic kidney disease, stage 4 (severe): Secondary | ICD-10-CM

## 2022-11-14 DIAGNOSIS — Z794 Long term (current) use of insulin: Secondary | ICD-10-CM

## 2022-11-14 MED ORDER — METOPROLOL SUCCINATE ER 25 MG PO TB24: 25.0000 mg | ORAL_TABLET | Freq: Every day | ORAL | 3 refills | Status: DC

## 2022-11-14 MED ORDER — NITROGLYCERIN 0.4 MG/SPRAY TL SOLN: 1.0000 | 5 refills | Status: AC | PRN

## 2022-11-14 NOTE — Progress Notes (Signed)
Office Visit    Patient [name] Date of Encounter: 11/14/2022  Primary Care Provider:  Adrian Prince, MD Primary Cardiologist:  Nicki Guadalajara, MD  Chief Complaint    67 year old female with a history of CAD, hypertension, hyperlipidemia, OSA not on CPAP, hypothyroidism, type 2 diabetes, CKD stage IV, asthma, and GERD presents for follow-up related to CAD and hypertension.   Past Medical History    Past Medical History:  Diagnosis Date   Acute bronchitis    Anemia    hx of anemia   Anginal pain    Arthritis    osteo   Asthma    Chronic kidney disease (CKD), stage IV (severe)    Coronary atherosclerosis    Cough    GERD (gastroesophageal reflux disease)    History of colon polyps    History of migraine    Hyperlipidemia    Hypertension    Obstructive sleep apnea (adult) (pediatric)    NO CPAP    Peripheral neuropathy    Type II or unspecified type diabetes mellitus without mention of complication, not stated as uncontrolled    type 2   Unspecified hypothyroidism    Past Surgical History:  Procedure Laterality Date   ANTERIOR FUSION CERVICAL SPINE  2009   CARPAL TUNNEL RELEASE Left    COLONOSCOPY     CORONARY STENT INTERVENTION  2007   LEFT HEART CATHETERIZATION WITH CORONARY ANGIOGRAM N/A 05/29/2014   Procedure: LEFT HEART CATHETERIZATION WITH CORONARY ANGIOGRAM;  Surgeon: Lesleigh Noe, MD;  Location: [facility] CATH LAB;  Service: Cardiovascular;  Laterality: N/A;   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   Procedure: TOTAL KNEE ARTHROPLASTY;  Surgeon: Jene Every, MD;  Location: [facility] ORS;  Service: Orthopedics;  Laterality: Left;  3 hrs   TOTAL KNEE ARTHROPLASTY Right 10/01/2021   Procedure: TOTAL KNEE ARTHROPLASTY;  Surgeon: Jene Every, MD;  Location: [facility];  Service: Orthopedics;  Laterality: Right;   TUBAL LIGATION  1990   WISDOM TOOTH EXTRACTION      Allergies  Allergies  Allergen Reactions   Allopurinol Rash    Toxic epidermal  necrolysis    Codeine Other (See Comments)    REACTION: hallucinations/"loopy"   Levaquin [Levofloxacin] Nausea And Vomiting   Sulfa Antibiotics Hives   Sulfonamide Derivatives Hives     Labs/Other Studies Reviewed    The following studies were reviewed today: Echo 04/2019: IMPRESSIONS     1. The left ventricle has normal systolic function with an ejection  fraction of 60-65%. The cavity size was normal. There is mildly increased  left ventricular wall thickness. Left ventricular diastolic parameters  were normal.   2. The right ventricle has normal systolic function. The cavity was  normal.   3. The tricuspid valve is grossly normal.   4. The aortic valve has an indeterminate number of cusps. Aortic valve  regurgitation is mild by color flow Doppler. No stenosis of the aortic  valve.   5. The aorta is normal unless otherwise noted.   6. Normal LV function; mild LVH; mild AI.    Recent Labs: 11/15/2021: ALT 10; Hemoglobin 11.1; Platelets 307 12/03/2021: BUN 26; Creatinine, Ser 1.80; Potassium 4.3; Sodium 142  Recent Lipid Panel    Component Value Date/Time   CHOL 186 02/17/2021 0947   TRIG 239 (H) 02/17/2021 0947   HDL 43 02/17/2021 0947   CHOLHDL 4.3 02/17/2021 0947   CHOLHDL 2.7 05/30/2014 0400   VLDL 31 05/30/2014 0400   LDLCALC  102 (H) 02/17/2021 0947    History of Present Illness    67 year old female with the above past medical history including CAD, hypertension, hyperlipidemia, OSA not on CPAP, hypothyroidism, type 2 diabetes, CKD stage IV, asthma, and GERD.   She is followed by Dr. Tresa EndoKelly. She has a known history of CAD and is s/p DES-pRCA in 2007, she was noted to have 60-70% LAD and 20% LCx stenosis.  Repeat cardiac catheterization in 2010 in the setting of recurrent chest pain showed 60% LAD stenosis, 20-30% circumflex stenosis and a widely patent RCA stent. More recently she had a high risk stress test in 2015 and underwent repeat heart catheterization at  that time showing 50% p-mLAD stenosis with negative FFR , D1 70%, and patent RCA stent. Her last echocardiogram in 04/2019 showed EF 60-65%, normal LV diastolic function, mild LVH, and mild AI. She has a history of orthostatic hypotension.  She was evaluated in the ED on 11/15/2021 with complaints of epigastric and chest pain.  EKG was unremarkable, troponin was negative.  Her symptoms improved with Maalox. She was last seen in the office on 12/03/2021 and was stable from a cardiac standpoint.  She denied any dizziness, lightheadedness, denied symptoms concerning for angina.  BP was stable.  She presents today for follow-up.  Since her last visit she has been stable overall from a cardiac standpoint.  She has noticed some intermittently low BPs, in this case she has held her metoprolol.  She has noted some lightheadedness when her blood pressure drops.  She denies any syncope.  She has stable intermittent chest discomfort that occurs following meals and at night when she goes to bed, often relieved with Alka-Seltzer.  She denies any exertional symptoms.  Other than her intermittently low BP and occasional lightheadedness, she reports feeling well.  Home Medications    Current Outpatient Medications  Medication Sig Dispense Refill   albuterol (PROVENTIL HFA) 108 (90 Base) MCG/ACT inhaler Inhale 2 puffs into the lungs every 6 (six) hours as needed for wheezing or shortness of breath. For shortness of breath and wheezing 18 each 2   Ascorbic Acid (VITAMIN C PO) Take 1 tablet by mouth daily.     BIOTIN PO Take 500 mg by mouth daily.     Cyanocobalamin (B-12 PO) Take 1,000 mcg by mouth daily.     Dexlansoprazole 30 MG capsule DR Take 30 mg by mouth as needed.     diazepam (VALIUM) 5 MG tablet Take 5 mg by mouth daily as needed for muscle spasms (cramps).     Diclofenac Sodium (VOLTAREN PO) Take by mouth daily.     Finerenone (KERENDIA) 10 MG TABS Take 10 mg by mouth daily.     fluticasone (FLONASE) 50  MCG/ACT nasal spray Place 2 sprays into both nostrils daily. (Patient taking differently: Place 2 sprays into both nostrils daily as needed for allergies.) 18.2 mL 11   fluticasone-salmeterol (ADVAIR HFA) 230-21 MCG/ACT inhaler Inhale 2 puffs into the lungs 2 (two) times daily. 8 g 6   furosemide (LASIX) 40 MG tablet Take 1 tablet (40 mg total) by mouth daily. In the morning (Patient taking differently: Take 40-60 mg by mouth See admin instructions. Take 60 mg in the morning and 20 mg bedtime) 90 tablet 3   hydrocortisone 2.5 % cream Apply topically 2 (two) times daily. Apply as needed two times daily 30 g 0   ipratropium-albuterol (DUONEB) 0.5-2.5 (3) MG/3ML SOLN Take 3 mLs by nebulization every 4 (four)  hours as needed. 360 mL 11   isosorbide mononitrate (IMDUR) 30 MG 24 hr tablet TAKE ONE TABLET BY MOUTH DAILY 30 tablet 6   JARDIANCE 10 MG TABS tablet Take 10 mg by mouth daily.     levothyroxine (SYNTHROID) 150 MCG tablet Take 150 mcg by mouth daily before breakfast.     Mepolizumab (NUCALA) 100 MG/ML SOAJ Inject 1 mL (100 mg total) into the skin every 28 (twenty-eight) days. 1 mL 5   metoprolol succinate (TOPROL XL) 25 MG 24 hr tablet Take 1 tablet (25 mg total) by mouth daily. 90 tablet 3   Multiple Vitamin (MULTIVITAMIN WITH MINERALS) TABS tablet Take 1 tablet by mouth daily.     potassium chloride (KLOR-CON M) 10 MEQ tablet TAKE ONE TABLET BY MOUTH DAILY 90 tablet 3   pregabalin (LYRICA) 100 MG capsule Take 100 mg by mouth 2 (two) times daily.     Pyridoxine HCl (B-6 PO) Take 1 tablet by mouth daily.     ranolazine (RANEXA) 500 MG 12 hr tablet Take 1 tablet (500 mg total) by mouth 2 (two) times daily. 180 tablet 3   rosuvastatin (CRESTOR) 20 MG tablet TAKE ONE TABLET BY MOUTH DAILY 90 tablet 3   tirzepatide (MOUNJARO) 5 MG/0.5ML Pen Inject 5 mg into the skin once a week.     traMADol (ULTRAM) 50 MG tablet Take 50 mg by mouth 3 (three) times daily as needed for pain.      triamterene-hydrochlorothiazide (MAXZIDE-25) 37.5-25 MG tablet TAKE ONE TABLET BY MOUTH DAILY 90 tablet 3   verapamil (CALAN-SR) 120 MG CR tablet Take 120 mg by mouth at bedtime.     insulin lispro (HUMALOG) 100 UNIT/ML KwikPen Inject 5-10 Units into the skin See admin instructions. Inject 5 units subcutaneously up to three times daily if CBG >200, inject 10 units up to three times daily if CBG >300 (Patient not taking: Reported on 11/14/2022)     Lancets (ONETOUCH DELICA PLUS LANCET33G) MISC Apply 1 each topically 4 (four) times daily.     nitroGLYCERIN (NITROLINGUAL) 0.4 MG/SPRAY spray Place 1 spray under the tongue every 5 (five) minutes x 3 doses as needed for chest pain. 12 g 5   NOVOFINE PLUS PEN NEEDLE 32G X 4 MM MISC Inject into the skin.     ONE TOUCH ULTRA TEST test strip Use as directed     oxyCODONE (OXY IR/ROXICODONE) 5 MG immediate release tablet Take 1 tablet (5 mg total) by mouth every 4 (four) hours as needed for severe pain. 40 tablet 0   Respiratory Therapy Supplies (FLUTTER) DEVI 1 puff by Does not apply route daily. 1 each 0   XIIDRA 5 % SOLN      No current facility-administered medications for this visit.     Review of Systems    She denies palpitations, dyspnea, pnd, orthopnea, n, v, dizziness, syncope, edema, weight gain, or early satiety. All other systems reviewed and are otherwise negative except as noted above.   Physical Exam    VS:  BP 110/66   Pulse 76   Ht 5\' 2"  (1.575 m)   Wt 155 lb (70.3 kg)   BMI 28.35 kg/m  GEN: Well nourished, well developed, in no acute distress. HEENT: normal. Neck: Supple, no JVD, carotid bruits, or masses. Cardiac: RRR, no murmurs, rubs, or gallops. No clubbing, cyanosis, edema.  Radials/DP/PT 2+ and equal bilaterally.  Respiratory:  Respirations regular and unlabored, clear to auscultation bilaterally. GI: Soft, nontender, nondistended, BS +  x 4. MS: no deformity or atrophy. Skin: warm and dry, no rash. Neuro:  Strength and  sensation are intact. Psych: Normal affect.  Accessory Clinical Findings    ECG personally reviewed by me today -NSR, 76 bpm- no acute changes.   Lab Results  Component Value Date   WBC 6.9 11/15/2021   HGB 11.1 (L) 11/15/2021   HCT 36.2 11/15/2021   MCV 79.7 (L) 11/15/2021   PLT 307 11/15/2021   Lab Results  Component Value Date   CREATININE 1.80 (H) 12/03/2021   BUN 26 12/03/2021   NA 142 12/03/2021   K 4.3 12/03/2021   CL 101 12/03/2021   CO2 26 12/03/2021   Lab Results  Component Value Date   ALT 10 11/15/2021   AST 18 11/15/2021   ALKPHOS 102 11/15/2021   BILITOT 0.7 11/15/2021   Lab Results  Component Value Date   CHOL 186 02/17/2021   HDL 43 02/17/2021   LDLCALC 102 (H) 02/17/2021   TRIG 239 (H) 02/17/2021   CHOLHDL 4.3 02/17/2021    Lab Results  Component Value Date   HGBA1C 6.2 (H) 09/21/2021    Assessment & Plan   1. Hypertension/orthostatic hypotension: She continues to note intermittently low BP with associated lightheadedness.  She denies syncope.  Will decrease metoprolol to 25 mg daily.  Continue to monitor BP and report SBP consistently less than 100. Continue Lasix, metoprolol succinate as above, isosorbide, Ranexa, verapamil, and Maxide.   2. CAD: Most recent cath in 2015 showed 50% p-mLAD stenosis with negative FFR, D1 70%, and patent RCA stent.  Echo in 2020 showed EF 60-65%, normal LV diastolic function, mild LVH, and mild AI.  She notes stable intermittent chest discomfort that occurs following meals and at nighttime when she goes to bed, mostly relieved with Alka-Seltzer. She denies any exertional symptoms. No indication for ischemic evaluation.  Continue to monitor symptoms.  Discussed ED precautions.  Continue aspirin, rosuvastatin, and current medications as above.   3. Hyperlipidemia: LDL was 55 in 02/2022. Continue Crestor.    4. OSA: She is not on on CPAP.  Stable.   5. CKD stage IV: Creatinine was 1.80 in 11/2021, stable. Follows with  nephrology.    6. Type 2 diabetes: A1c was 5.6 in 08/2022.  Monitored and managed per PCP.  8. Disposition: Follow-up in 6 months.      Joylene Grapes, NP 11/14/2022, 10:36 AM

## 2022-11-14 NOTE — Patient Instructions (Addendum)
Medication Instructions:  Decrease Metoprolol Succinate 25 mg daily  *If you need a refill on your cardiac medications before your next appointment, please call your pharmacy*   Lab Work: NONE ordered at this time of appointment   If you have labs (blood work) drawn today and your tests are completely normal, you will receive your results only by: MyChart Message (if you have MyChart) OR A paper copy in the mail If you have any lab test that is abnormal or we need to change your treatment, we will call you to review the results.   Testing/Procedures: NONE ordered at this time of appointment     Follow-Up: At [facility], you and your health needs are our priority.  As part of our continuing mission to provide you with exceptional heart care, we have created designated Provider Care Teams.  These Care Teams include your primary Cardiologist (physician) and Advanced Practice Providers (APPs -  Physician Assistants and Nurse Practitioners) who all work together to provide you with the care you need, when you need it.  We recommend signing up for the patient portal called "MyChart".  Sign up information is provided on this After Visit Summary.  MyChart is used to connect with patients for Virtual Visits (Telemedicine).  Patients are able to view lab/test results, encounter notes, upcoming appointments, etc.  Non-urgent messages can be sent to your provider as well.   To learn more about what you can do with MyChart, go to [email or web address].    Your next appointment:   6 month(s)  Provider:   Nicki Guadalajara, MD     Other Instructions

## 2022-11-15 ENCOUNTER — Other Ambulatory Visit (HOSPITAL_COMMUNITY): Payer: Self-pay

## 2022-11-15 ENCOUNTER — Other Ambulatory Visit: Payer: Self-pay

## 2022-11-15 MED ORDER — NUCALA 100 MG/ML ~~LOC~~ SOAJ
100.0000 mg | SUBCUTANEOUS | 2 refills | Status: DC
  Filled 2022-11-15: qty 1, 28d supply, fill #0
  Filled 2022-12-16: qty 1, 28d supply, fill #1
  Filled 2023-01-11: qty 1, 28d supply, fill #2

## 2022-11-15 NOTE — Telephone Encounter (Signed)
Refill sent for [facility] to [facility]: [phone]   Dose: 100 mg SQ every 4 weeks  Last OV: 04/13/22 Provider: Dr. Wynona Neat  Next OV: not scheduled but was due March 2024  Chesley Mires, PharmD, MPH, BCPS Clinical Pharmacist (Rheumatology and Pulmonology)

## 2022-11-16 ENCOUNTER — Other Ambulatory Visit (HOSPITAL_COMMUNITY): Payer: Self-pay

## 2022-11-21 ENCOUNTER — Other Ambulatory Visit (HOSPITAL_BASED_OUTPATIENT_CLINIC_OR_DEPARTMENT_OTHER): Payer: Self-pay | Admitting: Endocrinology

## 2022-11-21 DIAGNOSIS — Z1231 Encounter for screening mammogram for malignant neoplasm of breast: Secondary | ICD-10-CM

## 2022-11-22 ENCOUNTER — Encounter (HOSPITAL_BASED_OUTPATIENT_CLINIC_OR_DEPARTMENT_OTHER): Payer: Self-pay

## 2022-11-22 ENCOUNTER — Ambulatory Visit (HOSPITAL_BASED_OUTPATIENT_CLINIC_OR_DEPARTMENT_OTHER)
Admission: RE | Admit: 2022-11-22 | Discharge: 2022-11-22 | Disposition: A | Discharge status: Home / Self Care | Payer: Medicare Other | Source: Ambulatory Visit | Attending: Endocrinology | Admitting: Endocrinology

## 2022-11-22 DIAGNOSIS — Z1231 Encounter for screening mammogram for malignant neoplasm of breast: Secondary | ICD-10-CM | POA: Insufficient documentation

## 2022-11-29 ENCOUNTER — Ambulatory Visit (INDEPENDENT_AMBULATORY_CARE_PROVIDER_SITE_OTHER): Payer: Medicare Other | Admitting: Pulmonary Disease

## 2022-11-29 ENCOUNTER — Encounter: Payer: Self-pay | Admitting: Pulmonary Disease

## 2022-11-29 VITALS — BP 100/58 | HR 59 | Ht 62.0 in | Wt 154.0 lb

## 2022-11-29 DIAGNOSIS — J8283 Eosinophilic asthma: Secondary | ICD-10-CM

## 2022-11-29 NOTE — Progress Notes (Signed)
Synopsis: Referred in January 2021 for SOB by Adrian Prince, MD.   Subjective:   PATIENT ID: [name] GENDER: female DOB: 1955/08/31, MRN: [id number]  Patient with a history of severe persistent asthma Has been on Nucala for about 18 months  She does use her rescue inhaler once in a while On Advair HFA  Past history of Stevens-Johnson syndrome hospitalized for few days Denies any cough or significant shortness of breath with activity  Has been staying active going to the gym regularly   She has a history of allergic rhinosinusitis, sinus drainage Flonase, Singulair  Not feeling acutely ill at present  Past Medical History:  Diagnosis Date   Acute bronchitis    Anemia    hx of anemia   Anginal pain    Arthritis    osteo   Asthma    Chronic kidney disease (CKD), stage IV (severe)    Coronary atherosclerosis    Cough    GERD (gastroesophageal reflux disease)    History of colon polyps    History of migraine    Hyperlipidemia    Hypertension    Obstructive sleep apnea (adult) (pediatric)    NO CPAP    Peripheral neuropathy    Type II or unspecified type diabetes mellitus without mention of complication, not stated as uncontrolled    type 2   Unspecified hypothyroidism      Family History  Problem Relation Age of Onset   Hypertension Mother    Heart disease Mother    Stroke Mother    Prostate cancer Father    Heart disease Brother    Heart attack Sister      Past Surgical History:  Procedure Laterality Date   ANTERIOR FUSION CERVICAL SPINE  2009   CARPAL TUNNEL RELEASE Left    COLONOSCOPY     CORONARY STENT INTERVENTION  2007   LEFT HEART CATHETERIZATION WITH CORONARY ANGIOGRAM N/A 05/29/2014   Procedure: LEFT HEART CATHETERIZATION WITH CORONARY ANGIOGRAM;  Surgeon: Lesleigh Noe, MD;  Location: [facility] CATH LAB;  Service: Cardiovascular;  Laterality: N/A;   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   Procedure: TOTAL KNEE ARTHROPLASTY;  Surgeon:  Jene Every, MD;  Location: [facility];  Service: Orthopedics;  Laterality: Left;  3 hrs   TOTAL KNEE ARTHROPLASTY Right 10/01/2021   Procedure: TOTAL KNEE ARTHROPLASTY;  Surgeon: Jene Every, MD;  Location: [facility];  Service: Orthopedics;  Laterality: Right;   TUBAL LIGATION  1990   WISDOM TOOTH EXTRACTION      Social History   Socioeconomic History   Marital status: Widowed    Spouse name: Not on file   Number of children: 0   Years of education: Not on file   Highest education level: Not on file  Occupational History   Occupation: retired  Tobacco Use   Smoking status: Former    Packs/day: 0.50    Years: 25.00    Additional pack years: 0.00    Total pack years: 12.50    Types: Cigarettes    Quit date: 08/09/2003    Years since quitting: 19.3   Smokeless tobacco: Never  Vaping Use   Vaping Use: Never used  Substance and Sexual Activity   Alcohol use: No    Alcohol/week: 0.0 standard drinks of alcohol   Drug use: No   Sexual activity: Not on file  Other Topics Concern   Not on file  Social History Narrative   Not on file   Social Determinants  of Health   Financial Resource Strain: Not on file  Food Insecurity: Not on file  Transportation Needs: Not on file  Physical Activity: Not on file  Stress: Not on file  Social Connections: Not on file  Intimate Partner Violence: Not on file     Allergies  Allergen Reactions   Allopurinol Rash    Toxic epidermal necrolysis    Codeine Other (See Comments)    REACTION: hallucinations/"loopy"   Levaquin [Levofloxacin] Nausea And Vomiting   Sulfa Antibiotics Hives   Sulfonamide Derivatives Hives     Immunization History  Administered Date(s) Administered   Fluad Quad(high Dose 65+) 06/05/2019   Influenza Split 05/08/2010, 05/10/2011, 05/09/2012, 05/31/2013, 05/26/2020   Influenza Whole 05/08/2010, 05/26/2020   Influenza,inj,Quad PF,6+ Mos 09/14/2015, 04/19/2016, 04/24/2017   Influenza,inj,quad, With Preservative  05/23/2019   Influenza-Unspecified 04/22/2010, 05/09/2012, 08/08/2012, 06/04/2014, 05/14/2020   PFIZER Comirnaty(Gray Top)Covid-19 Tri-Sucrose Vaccine 06/28/2020, 01/31/2021   PFIZER(Purple Top)SARS-COV-2 Vaccination 10/03/2019, 10/25/2019   Pneumococcal Polysaccharide-23 08/09/2007, 07/08/2010   Td 08/08/2008   Td (Adult),5 Lf Tetanus Toxid, Preservative Free 08/08/2008    Outpatient Medications Prior to Visit  Medication Sig Dispense Refill   Ascorbic Acid (VITAMIN C PO) Take 1 tablet by mouth daily.     BIOTIN PO Take 500 mg by mouth daily.     Cyanocobalamin (B-12 PO) Take 1,000 mcg by mouth daily.     Dexlansoprazole 30 MG capsule DR Take 30 mg by mouth as needed.     diazepam (VALIUM) 5 MG tablet Take 5 mg by mouth daily as needed for muscle spasms (cramps).     Diclofenac Sodium (VOLTAREN PO) Take by mouth daily.     Finerenone (KERENDIA) 10 MG TABS Take 10 mg by mouth daily.     fluticasone (FLONASE) 50 MCG/ACT nasal spray Place 2 sprays into both nostrils daily. (Patient taking differently: Place 2 sprays into both nostrils daily as needed for allergies.) 18.2 mL 11   furosemide (LASIX) 40 MG tablet Take 1 tablet (40 mg total) by mouth daily. In the morning (Patient taking differently: Take 40-60 mg by mouth See admin instructions. Take 60 mg in the morning and 20 mg bedtime) 90 tablet 3   hydrocortisone 2.5 % cream Apply topically 2 (two) times daily. Apply as needed two times daily 30 g 0   ipratropium-albuterol (DUONEB) 0.5-2.5 (3) MG/3ML SOLN Take 3 mLs by nebulization every 4 (four) hours as needed. 360 mL 11   isosorbide mononitrate (IMDUR) 30 MG 24 hr tablet TAKE ONE TABLET BY MOUTH DAILY 30 tablet 6   JARDIANCE 10 MG TABS tablet Take 10 mg by mouth daily.     Lancets (ONETOUCH DELICA PLUS LANCET33G) MISC Apply 1 each topically 4 (four) times daily.     levothyroxine (SYNTHROID) 150 MCG tablet Take 150 mcg by mouth daily before breakfast.     Mepolizumab (NUCALA) 100 MG/ML  SOAJ Inject 1 mL (100 mg total) into the skin every 28 (twenty-eight) days. 1 mL 2   metoprolol succinate (TOPROL XL) 25 MG 24 hr tablet Take 1 tablet (25 mg total) by mouth daily. 90 tablet 3   Multiple Vitamin (MULTIVITAMIN WITH MINERALS) TABS tablet Take 1 tablet by mouth daily.     nitroGLYCERIN (NITROLINGUAL) 0.4 MG/SPRAY spray Place 1 spray under the tongue every 5 (five) minutes x 3 doses as needed for chest pain. 12 g 5   ONE TOUCH ULTRA TEST test strip Use as directed     potassium chloride (KLOR-CON M) 10  MEQ tablet TAKE ONE TABLET BY MOUTH DAILY 90 tablet 3   pregabalin (LYRICA) 100 MG capsule Take 100 mg by mouth 2 (two) times daily.     Pyridoxine HCl (B-6 PO) Take 1 tablet by mouth daily.     ranolazine (RANEXA) 500 MG 12 hr tablet Take 1 tablet (500 mg total) by mouth 2 (two) times daily. 180 tablet 3   Respiratory Therapy Supplies (FLUTTER) DEVI 1 puff by Does not apply route daily. 1 each 0   rosuvastatin (CRESTOR) 20 MG tablet TAKE ONE TABLET BY MOUTH DAILY 90 tablet 3   tirzepatide (MOUNJARO) 5 MG/0.5ML Pen Inject 5 mg into the skin once a week.     traMADol (ULTRAM) 50 MG tablet Take 50 mg by mouth 3 (three) times daily as needed for pain.     triamterene-hydrochlorothiazide (MAXZIDE-25) 37.5-25 MG tablet TAKE ONE TABLET BY MOUTH DAILY 90 tablet 3   albuterol (PROVENTIL HFA) 108 (90 Base) MCG/ACT inhaler Inhale 2 puffs into the lungs every 6 (six) hours as needed for wheezing or shortness of breath. For shortness of breath and wheezing (Patient not taking: Reported on 11/29/2022) 18 each 2   fluticasone-salmeterol (ADVAIR HFA) 230-21 MCG/ACT inhaler Inhale 2 puffs into the lungs 2 (two) times daily. (Patient not taking: Reported on 11/29/2022) 8 g 6   insulin lispro (HUMALOG) 100 UNIT/ML KwikPen Inject 5-10 Units into the skin See admin instructions. Inject 5 units subcutaneously up to three times daily if CBG >200, inject 10 units up to three times daily if CBG >300 (Patient not  taking: Reported on 11/14/2022)     NOVOFINE PLUS PEN NEEDLE 32G X 4 MM MISC Inject into the skin. (Patient not taking: Reported on 11/29/2022)     oxyCODONE (OXY IR/ROXICODONE) 5 MG immediate release tablet Take 1 tablet (5 mg total) by mouth every 4 (four) hours as needed for severe pain. (Patient not taking: Reported on 11/29/2022) 40 tablet 0   verapamil (CALAN-SR) 120 MG CR tablet Take 120 mg by mouth at bedtime. (Patient not taking: Reported on 11/29/2022)     XIIDRA 5 % SOLN  (Patient not taking: Reported on 11/29/2022)     No facility-administered medications prior to visit.    Review of Systems  Constitutional:  Negative for chills, fever and weight loss.  HENT:  Negative for congestion.        Postnasal drip  Respiratory:  Positive for shortness of breath. Negative for cough and wheezing.   Cardiovascular:  Negative for chest pain and leg swelling.  Gastrointestinal:  Negative for heartburn.  Endo/Heme/Allergies:  Positive for environmental allergies.     Objective:   Vitals:   11/29/22 1603  BP: (!) 100/58  Pulse: (!) 59  SpO2: 96%  Weight: 154 lb (69.9 kg)  Height:  (1.575 m)   96% on   RA BMI Readings from Last 3 Encounters:  11/29/22 28.17 kg/m  11/14/22 28.35 kg/m  04/13/22 28.75 kg/m   Wt Readings from Last 3 Encounters:  11/29/22 154 lb (69.9 kg)  11/14/22 155 lb (70.3 kg)  04/13/22 157 lb 3.2 oz (71.3 kg)    Physical Exam Vitals reviewed.  Constitutional:      Appearance: Normal appearance. She is not ill-appearing.  HENT:     Head: Normocephalic and atraumatic.  Eyes:     General: No scleral icterus. Cardiovascular:     Rate and Rhythm: Normal rate and regular rhythm.     Heart sounds: No murmur heard.  No friction rub.  Pulmonary:     Effort: No respiratory distress.     Breath sounds: No stridor. No wheezing or rhonchi.  Musculoskeletal:     Cervical back: No rigidity or tenderness.  Lymphadenopathy:     Cervical: No cervical  adenopathy.  Skin:    General: Skin is warm.  Neurological:     Mental Status: She is alert.     Motor: No weakness.     Coordination: Coordination normal.  Psychiatric:        Mood and Affect: Mood normal.        Behavior: Behavior normal.    CBC    Component Value Date/Time   WBC 6.9 11/15/2021 1539   RBC 4.54 11/15/2021 1539   HGB 11.1 (L) 11/15/2021 1539   HGB 10.9 (L) 03/28/2018 1031   HCT 36.2 11/15/2021 1539   HCT 35.4 03/28/2018 1031   PLT 307 11/15/2021 1539   PLT 312 03/28/2018 1031   MCV 79.7 (L) 11/15/2021 1539   MCV 77 (L) 03/28/2018 1031   MCH 24.4 (L) 11/15/2021 1539   MCHC 30.7 11/15/2021 1539   RDW 15.8 (H) 11/15/2021 1539   RDW 17.0 (H) 03/28/2018 1031   LYMPHSABS 1.6 11/15/2021 1539   MONOABS 0.7 11/15/2021 1539   EOSABS 0.1 11/15/2021 1539   BASOSABS 0.0 11/15/2021 1539    09/05/2019 labs: IgE 419 Eosinophils 800  02/17/2021-Chem-7 within normal limits-BUN 29, creatinine 1.9, 02/16/2021 CBC within normal limits  Chest Imaging- films reviewed: CXR, 2 view 12/08/2015- C-spine hardware, otherwise normal No recent chest x-ray available  Pulmonary Functions Testing Results:    Latest Ref Rng & Units 12/22/2015    1:49 PM  PFT Results  FVC-Pre L 2.60   FVC-Predicted Pre % 106   FVC-Post L 2.48   FVC-Predicted Post % 102   Pre FEV1/FVC % % 72   Post FEV1/FCV % % 67   FEV1-Pre L 1.87   FEV1-Predicted Pre % 97   FEV1-Post L 1.67   DLCO uncorrected ml/min/mmHg 13.06   DLCO UNC% % 60   DLVA Predicted % 66   TLC L 4.76   TLC % Predicted % 100   RV % Predicted % 118    2017- FVC 3.0 (97%) 3.18 (102%, +6%)  FEV1  2.05 (87%) 2.14 (91%, +5%) Ratio 68 TLC 106% RV 119% DLCO 62%    Echocardiogram 04/23/2019: LVEF 60 to 65%, mild LVH.  Normal diastolic function.  Normal LA, RV, RA.  Mild AI, otherwise normal valves.     Assessment & Plan:    Severe persistent asthma with elevated IgE and eosinophils -On Nucala -No recent  exacerbation -Advair HFA -Only needs rescue inhalers very infrequently  Chronic rhinosinusitis -Continue nasal steroids -Symptoms have been relatively stable  History of GERD -Continue antireflux medications  History of Trudie Buckler syndrome -Breathing remains stable -Encouraged to call with any significant concerns with regards to her breathing   Continue Nucala Continue graded exercise as tolerated  Continue rescue inhaler use as needed  Encouraged to give [place] a call with any significant concerns  Follow-up in 6 months

## 2022-11-29 NOTE — Patient Instructions (Signed)
I will see you about 6 months from here  Continue using your Nucala  Continue using rescue inhaler as needed  Continue graded exercise as tolerated  Call us with significant concerns

## 2022-12-13 ENCOUNTER — Other Ambulatory Visit (HOSPITAL_COMMUNITY): Payer: Self-pay

## 2022-12-16 ENCOUNTER — Other Ambulatory Visit: Payer: Self-pay

## 2022-12-16 ENCOUNTER — Other Ambulatory Visit (HOSPITAL_COMMUNITY): Payer: Self-pay

## 2022-12-19 ENCOUNTER — Other Ambulatory Visit: Payer: Self-pay

## 2022-12-19 MED ORDER — RANOLAZINE ER 500 MG PO TB12: 500.0000 mg | ORAL_TABLET | Freq: Two times a day (BID) | ORAL | 3 refills | Status: DC

## 2022-12-20 ENCOUNTER — Telehealth: Payer: Self-pay | Admitting: Nurse Practitioner

## 2022-12-20 NOTE — Telephone Encounter (Signed)
Patient readings 89/57 and 84/54 HR 76 after lunch. She took her BP meds this AM at approx 9:30 am.  She states after 12 , she was feeling light headedness , like "I was going to fall out" . States even at rest.  Yesterday she states the same issue with low BP and she went and laied down and did not feel any better.  She states able to get up and get a shower about 11pm and then went back to bed to sleep, since still felt unwell. When up this am she felt good with no symptoms.    Lasix 40mg  in the AM and 20mg  in PM Isosorbide 30mg   Daily Toprol 25 mg in the AM  12.5 mg in PM Ranexa  500 mg Daily  Will take BP before bedtime.  Will also take BP before morning meds. She will include HR. If she does not hear back today with recommendations, she will take morning BP and hold meds and call office in the morning

## 2022-12-20 NOTE — Telephone Encounter (Signed)
Pt c/o BP issue: STAT if pt c/o blurred vision, one-sided weakness or slurred speech  1. What are your last 5 BP readings?   89/57 84/54   2. Are you having any other symptoms (ex. Dizziness, headache, blurred vision, passed out)? Feels like she has to pass out, dizziness   3. What is your BP issue? Pt  states her bp has been low and she feels like "yuck". She asked if she should stop taking her metoprolol.

## 2022-12-21 ENCOUNTER — Other Ambulatory Visit: Payer: Self-pay

## 2022-12-21 MED ORDER — VERAPAMIL HCL 80 MG PO TABS: 80.0000 mg | ORAL_TABLET | Freq: Every day | ORAL | 3 refills | Status: DC | PRN

## 2022-12-21 MED ORDER — VERAPAMIL HCL 80 MG PO TABS: 80.0000 mg | ORAL_TABLET | Freq: Every day | ORAL | 3 refills | Status: DC

## 2022-12-21 NOTE — Telephone Encounter (Signed)
Spoke with pt. We discussed medication changes of Metoprolol succinate 12.5 mg daily. Pt mentioned she thought the Verapamil was decreased and would like a refill sent in of Verapamil 80 mg daily. Pt is going to keep the Lasix dose as it is for now. Pt wanted an earlier appointment than 1 month. Scheduled pt for Jan 04, 2023 with Bernadene Person @ 8:50 AM. Pt is going to try medication adjustments and call back if she has any problems.

## 2023-01-04 ENCOUNTER — Ambulatory Visit: Payer: Medicare Other | Attending: Nurse Practitioner | Admitting: Nurse Practitioner

## 2023-01-04 ENCOUNTER — Encounter: Payer: Self-pay | Admitting: Nurse Practitioner

## 2023-01-04 VITALS — BP 88/50 | HR 57 | Ht 62.0 in | Wt 160.0 lb

## 2023-01-04 DIAGNOSIS — E785 Hyperlipidemia, unspecified: Secondary | ICD-10-CM

## 2023-01-04 DIAGNOSIS — I10 Essential (primary) hypertension: Secondary | ICD-10-CM

## 2023-01-04 DIAGNOSIS — I951 Orthostatic hypotension: Secondary | ICD-10-CM | POA: Diagnosis not present

## 2023-01-04 DIAGNOSIS — N184 Chronic kidney disease, stage 4 (severe): Secondary | ICD-10-CM

## 2023-01-04 DIAGNOSIS — E118 Type 2 diabetes mellitus with unspecified complications: Secondary | ICD-10-CM

## 2023-01-04 DIAGNOSIS — I2510 Atherosclerotic heart disease of native coronary artery without angina pectoris: Secondary | ICD-10-CM

## 2023-01-04 DIAGNOSIS — G4733 Obstructive sleep apnea (adult) (pediatric): Secondary | ICD-10-CM

## 2023-01-04 DIAGNOSIS — Z794 Long term (current) use of insulin: Secondary | ICD-10-CM

## 2023-01-04 NOTE — Patient Instructions (Addendum)
Medication Instructions:  Stop Triamterene-hydrochlorothiazide (Maxide) 37.5-25 mg as directed Stop Verapamil as directed  *If you need a refill on your cardiac medications before your next appointment, please call your pharmacy*   Lab Work: NONE ordered at this time of appointment   If you have labs (blood work) drawn today and your tests are completely normal, you will receive your results only by: MyChart Message (if you have MyChart) OR A paper copy in the mail If you have any lab test that is abnormal or we need to change your treatment, we will call you to review the results.   Testing/Procedures: NONE ordered at this time of appointment     Follow-Up: At [facility], you and your health needs are our priority.  As part of our continuing mission to provide you with exceptional heart care, we have created designated Provider Care Teams.  These Care Teams include your primary Cardiologist (physician) and Advanced Practice Providers (APPs -  Physician Assistants and Nurse Practitioners) who all work together to provide you with the care you need, when you need it.  We recommend signing up for the patient portal called "MyChart".  Sign up information is provided on this After Visit Summary.  MyChart is used to connect with patients for Virtual Visits (Telemedicine).  Patients are able to view lab/test results, encounter notes, upcoming appointments, etc.  Non-urgent messages can be sent to your provider as well.   To learn more about what you can do with MyChart, go to [email or web address].    Your next appointment:   1 month(s)  Provider:   Bernadene Person, NP        Other Instructions Report systolic BP (top number) less than 100.

## 2023-01-04 NOTE — Progress Notes (Signed)
Office Visit    Patient [name] Date of Encounter: 01/04/2023  Primary Care Provider:  Adrian Prince, MD Primary Cardiologist:  Nicki Guadalajara, MD  Chief Complaint    67 year old female with a history of CAD, hypertension, hyperlipidemia, OSA not on CPAP, hypothyroidism, type 2 diabetes, CKD stage IV, asthma, and GERD presents for follow-up related to CAD and hypertension.   Past Medical History    Past Medical History:  Diagnosis Date   Acute bronchitis    Anemia    hx of anemia   Anginal pain (HCC)    Arthritis    osteo   Asthma    Chronic kidney disease (CKD), stage IV (severe) (HCC)    Coronary atherosclerosis    Cough    GERD (gastroesophageal reflux disease)    History of colon polyps    History of migraine    Hyperlipidemia    Hypertension    Obstructive sleep apnea (adult) (pediatric)    NO CPAP    Peripheral neuropathy    Type II or unspecified type diabetes mellitus without mention of complication, not stated as uncontrolled    type 2   Unspecified hypothyroidism    Past Surgical History:  Procedure Laterality Date   ANTERIOR FUSION CERVICAL SPINE  2009   CARPAL TUNNEL RELEASE Left    COLONOSCOPY     CORONARY STENT INTERVENTION  2007   LEFT HEART CATHETERIZATION WITH CORONARY ANGIOGRAM N/A 05/29/2014   Procedure: LEFT HEART CATHETERIZATION WITH CORONARY ANGIOGRAM;  Surgeon: Lesleigh Noe, MD;  Location: [facility] CATH LAB;  Service: Cardiovascular;  Laterality: N/A;   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   Procedure: TOTAL KNEE ARTHROPLASTY;  Surgeon: Jene Every, MD;  Location: [facility] ORS;  Service: Orthopedics;  Laterality: Left;  3 hrs   TOTAL KNEE ARTHROPLASTY Right 10/01/2021   Procedure: TOTAL KNEE ARTHROPLASTY;  Surgeon: Jene Every, MD;  Location: [facility];  Service: Orthopedics;  Laterality: Right;   TUBAL LIGATION  1990   WISDOM TOOTH EXTRACTION      Allergies  Allergies  Allergen Reactions   Allopurinol Rash    Toxic  epidermal necrolysis    Codeine Other (See Comments)    REACTION: hallucinations/"loopy"   Levaquin [Levofloxacin] Nausea And Vomiting   Sulfa Antibiotics Hives   Sulfonamide Derivatives Hives     Labs/Other Studies Reviewed    The following studies were reviewed today:  Cardiac Studies & Procedures       ECHOCARDIOGRAM  ECHOCARDIOGRAM COMPLETE 04/23/2019  Narrative ECHOCARDIOGRAM REPORT    Patient Name:   [name] Date of Exam: 04/23/2019 Medical Rec #:  [id number]         Height:       62.0 in Accession #:    [id number]        Weight:       187.0 lb Date of Birth:  July 05, 1956         BSA:          1.86 m Patient Age:    67 years          BP:           126/68 mmHg Patient Gender: F                 HR:           60 bpm. Exam Location:  [address]   Procedure: 2D Echo, Cardiac Doppler and Color Doppler  Indications:    R06.09 DOE  History:        Patient has prior history of Echocardiogram examinations, most recent 01/06/2016. CAD Signs/Symptoms: Chest Pain Risk Factors: Hypertension, Diabetes, Sleep Apnea and HLD.  Sonographer:    Clearence Ped RCS Referring Phys: [phone] THOMAS A KELLY  IMPRESSIONS   1. The left ventricle has normal systolic function with an ejection fraction of 60-65%. The cavity size was normal. There is mildly increased left ventricular wall thickness. Left ventricular diastolic parameters were normal. 2. The right ventricle has normal systolic function. The cavity was normal. 3. The tricuspid valve is grossly normal. 4. The aortic valve has an indeterminate number of cusps. Aortic valve regurgitation is mild by color flow Doppler. No stenosis of the aortic valve. 5. The aorta is normal unless otherwise noted. 6. Normal LV function; mild LVH; mild AI.  FINDINGS Left Ventricle: The left ventricle has normal systolic function, with an ejection fraction of 60-65%. The cavity size was normal. There is mildly increased left ventricular  wall thickness. Left ventricular diastolic parameters were normal.  Right Ventricle: The right ventricle has normal systolic function. The cavity was normal.  Left Atrium: Left atrial size was normal in size.  Right Atrium: Right atrial size was normal in size.  Interatrial Septum: No atrial level shunt detected by color flow Doppler.  Pericardium: There is no evidence of pericardial effusion.  Mitral Valve: The mitral valve is normal in structure. Mitral valve regurgitation is trivial by color flow Doppler.  Tricuspid Valve: The tricuspid valve is grossly normal. Tricuspid valve regurgitation was not visualized by color flow Doppler.  Aortic Valve: The aortic valve has an indeterminate number of cusps Aortic valve regurgitation is mild by color flow Doppler. There is No stenosis of the aortic valve.  Pulmonic Valve: The pulmonic valve was not well visualized. Pulmonic valve regurgitation is trivial by color flow Doppler.  Aorta: The aorta is normal unless otherwise noted.  Venous: The inferior vena cava is normal in size with greater than 50% respiratory variability.  Additional Comments: Normal LV function; mild LVH; mild AI.   +--------------+--------++ LEFT VENTRICLE         +----------------+---------++ +--------------+--------++ Diastology                PLAX 2D                +----------------+---------++ +--------------+--------++ LV e' lateral:  9.14 cm/s LVIDd:        4.36 cm  +----------------+---------++ +--------------+--------++ LV E/e' lateral:12.4      LVIDs:        2.98 cm  +----------------+---------++ +--------------+--------++ LV e' medial:   8.70 cm/s LV PW:        1.29 cm  +----------------+---------++ +--------------+--------++ LV E/e' medial: 13.0      LV IVS:       1.21 cm  +----------------+---------++ +--------------+--------++ LVOT diam:    2.00 cm  +--------------+--------++ LV SV:        51 ml     +--------------+--------++ LV SV Index:  26.17    +--------------+--------++ LVOT Area:    3.14 cm +--------------+--------++                        +--------------+--------++  +---------------+---------++ RIGHT VENTRICLE          +---------------+---------++ RV Basal diam: 2.88 cm   +---------------+---------++ RV S prime:    9.36 cm/s +---------------+---------++ TAPSE (M-mode):2.7 cm    +---------------+---------++  +---------------+-------++-----------++ LEFT ATRIUM  Index       +---------------+-------++-----------++ LA diam:       3.70 cm1.99 cm/m  +---------------+-------++-----------++ LA Vol (A2C):  49.7 ml26.75 ml/m +---------------+-------++-----------++ LA Vol (A4C):  61.3 ml32.99 ml/m +---------------+-------++-----------++ LA Biplane Vol:56.6 ml30.47 ml/m +---------------+-------++-----------++ +------------+---------++-----------++ RIGHT ATRIUM         Index       +------------+---------++-----------++ RA Area:    10.30 cm            +------------+---------++-----------++ RA Volume:  21.50 ml 11.57 ml/m +------------+---------++-----------++ +------------+-----------++ AORTIC VALVE            +------------+-----------++ LVOT Vmax:  98.30 cm/s  +------------+-----------++ LVOT Vmean: 66.900 cm/s +------------+-----------++ LVOT VTI:   0.245 m     +------------+-----------++ AR PHT:     834 msec    +------------+-----------++  +-------------+-------++ AORTA                +-------------+-------++ Ao Root diam:3.20 cm +-------------+-------++  +--------------+--------++ MITRAL VALVE              +--------------+-------+ +--------------+--------++    SHUNTS                MV Area (PHT):            +--------------+-------+ +--------------+--------++    Systemic VTI: 0.24 m  MV PHT:                    +--------------+-------+ +--------------+--------++    Systemic Diam:2.00 cm MV Decel Time:197 msec    +--------------+-------+ +--------------+--------++ +--------------+-----------++ MV E velocity:113.00 cm/s +--------------+-----------++ MV A velocity:72.80 cm/s  +--------------+-----------++ MV E/A ratio: 1.55        +--------------+-----------++   Olga Millers MD Electronically signed by Olga Millers MD Signature Date/Time: 04/23/2019/3:48:22 PM    Final            Recent Labs: No results found for requested labs within last 365 days.  Recent Lipid Panel    Component Value Date/Time   CHOL 186 02/17/2021 0947   TRIG 239 (H) 02/17/2021 0947   HDL 43 02/17/2021 0947   CHOLHDL 4.3 02/17/2021 0947   CHOLHDL 2.7 05/30/2014 0400   VLDL 31 05/30/2014 0400   LDLCALC 102 (H) 02/17/2021 0947    History of Present Illness    67 year old female with the above past medical history including CAD, hypertension, hyperlipidemia, OSA not on CPAP, hypothyroidism, type 2 diabetes, CKD stage IV, asthma, and GERD.   She is followed by Dr. Tresa Endo. She has a known history of CAD and is s/p DES-pRCA in 2007, she was noted to have 60-70% LAD and 20% LCx stenosis.  Repeat cardiac catheterization in 2010 in the setting of recurrent chest pain showed 60% LAD stenosis, 20-30% circumflex stenosis and a widely patent RCA stent. More recently she had a high risk stress test in 2015 and underwent repeat heart catheterization at that time showing 50% p-mLAD stenosis with negative FFR , D1 70%, and patent RCA stent. Her last echocardiogram in 04/2019 showed EF 60-65%, normal LV diastolic function, mild LVH, and mild AI. She has a history of orthostatic hypotension.  She was evaluated in the ED on 11/15/2021 with complaints of epigastric and chest pain. EKG was unremarkable, troponin was negative.  Her symptoms improved with Maalox. She was last seen in the office on 11/14/2022 and was  stable overall from a cardiac  .  She did note intermittently low blood pressure with associated lightheadedness.  Metoprolol was decreased to 25 mg daily.  This was subsequently decreased to 12.5 mg daily in the setting of ongoing hypotension.  Verapamil was decreased to 80 mg daily.   She presents today for follow-up.  Since her last visit she has been stable overall from a cardiac standpoint.  She continues to note low BP following her medications with associated dizziness, lightheadedness, presyncope.  She denies any actual syncope.  She denies symptoms concerning for angina.  Other than her ongoing hypotension, she reports feeling well.  Home Medications    Current Outpatient Medications  Medication Sig Dispense Refill   Ascorbic Acid (VITAMIN C PO) Take 1 tablet by mouth daily.     BIOTIN PO Take 500 mg by mouth daily.     Carboxymeth-Glyc-Polysorb PF (REFRESH OPTIVE MEGA-3) 0.5-1-0.5 % SOLN Place 1 drop into both eyes daily in the afternoon.     Cyanocobalamin (B-12 PO) Take 1,000 mcg by mouth daily.     Diclofenac Sodium (VOLTAREN PO) Take by mouth daily.     Finerenone (KERENDIA) 10 MG TABS Take 10 mg by mouth daily.     fluticasone-salmeterol (ADVAIR HFA) 230-21 MCG/ACT inhaler Inhale 2 puffs into the lungs 2 (two) times daily. 8 g 6   furosemide (LASIX) 40 MG tablet Take 1 tablet (40 mg total) by mouth daily. In the morning (Patient taking differently: Take 40-60 mg by mouth See admin instructions. Take 60 mg in the morning and 20 mg bedtime) 90 tablet 3   isosorbide mononitrate (IMDUR) 30 MG 24 hr tablet TAKE ONE TABLET BY MOUTH DAILY 30 tablet 6   JARDIANCE 10 MG TABS tablet Take 10 mg by mouth daily.     Lancets (ONETOUCH DELICA PLUS LANCET33G) MISC Apply 1 each topically 4 (four) times daily.     levothyroxine (SYNTHROID) 150 MCG tablet Take 150 mcg by mouth daily before breakfast.     Mepolizumab (NUCALA) 100 MG/ML SOAJ Inject 1 mL (100 mg total) into the skin every 28  (twenty-eight) days. 1 mL 2   metoprolol succinate (TOPROL XL) 25 MG 24 hr tablet Take 1 tablet (25 mg total) by mouth daily. 90 tablet 3   Multiple Vitamin (MULTIVITAMIN WITH MINERALS) TABS tablet Take 1 tablet by mouth daily.     NOVOFINE PLUS PEN NEEDLE 32G X 4 MM MISC Inject into the skin.     ONE TOUCH ULTRA TEST test strip Use as directed     potassium chloride (KLOR-CON M) 10 MEQ tablet TAKE ONE TABLET BY MOUTH DAILY 90 tablet 3   pregabalin (LYRICA) 100 MG capsule Take 100 mg by mouth 2 (two) times daily.     Pyridoxine HCl (B-6 PO) Take 1 tablet by mouth daily.     ranolazine (RANEXA) 500 MG 12 hr tablet Take 1 tablet (500 mg total) by mouth 2 (two) times daily. 180 tablet 3   Respiratory Therapy Supplies (FLUTTER) DEVI 1 puff by Does not apply route daily. 1 each 0   rosuvastatin (CRESTOR) 20 MG tablet TAKE ONE TABLET BY MOUTH DAILY 90 tablet 3   tirzepatide (MOUNJARO) 5 MG/0.5ML Pen Inject 5 mg into the skin once a week.     triamterene-hydrochlorothiazide (MAXZIDE-25) 37.5-25 MG tablet TAKE ONE TABLET BY MOUTH DAILY 90 tablet 3   XIIDRA 5 % SOLN      albuterol (PROVENTIL HFA) 108 (90 Base) MCG/ACT inhaler Inhale 2 puffs into the lungs every 6 (six) hours as needed for wheezing or shortness of breath. For shortness of breath and wheezing (Patient not taking: Reported  on 01/04/2023) 18 each 2   colchicine 0.6 MG tablet Take 0.6 mg by mouth as needed.     Dexlansoprazole 30 MG capsule DR Take 30 mg by mouth as needed. (Patient not taking: Reported on 01/04/2023)     diazepam (VALIUM) 5 MG tablet Take 5 mg by mouth daily as needed for muscle spasms (cramps). (Patient not taking: Reported on 01/04/2023)     fluticasone (FLONASE) 50 MCG/ACT nasal spray Place 2 sprays into both nostrils daily. (Patient not taking: Reported on 01/04/2023) 18.2 mL 11   hydrocortisone 2.5 % cream Apply topically 2 (two) times daily. Apply as needed two times daily (Patient not taking: Reported on 01/04/2023) 30 g 0    insulin glargine (LANTUS SOLOSTAR) 100 UNIT/ML Solostar Pen Inject 30 Units into the skin as needed. (Patient not taking: Reported on 01/04/2023)     insulin lispro (HUMALOG) 100 UNIT/ML KwikPen Inject 5-10 Units into the skin See admin instructions. Inject 5 units subcutaneously up to three times daily if CBG >200, inject 10 units up to three times daily if CBG >300 (Patient not taking: Reported on 01/04/2023)     ipratropium-albuterol (DUONEB) 0.5-2.5 (3) MG/3ML SOLN Take 3 mLs by nebulization every 4 (four) hours as needed. (Patient not taking: Reported on 01/04/2023) 360 mL 11   meclizine (ANTIVERT) 25 MG tablet Take 25 mg by mouth as needed. (Patient not taking: Reported on 01/04/2023)     nitroGLYCERIN (NITROLINGUAL) 0.4 MG/SPRAY spray Place 1 spray under the tongue every 5 (five) minutes x 3 doses as needed for chest pain. (Patient not taking: Reported on 01/04/2023) 12 g 5   oxyCODONE (OXY IR/ROXICODONE) 5 MG immediate release tablet Take 1 tablet (5 mg total) by mouth every 4 (four) hours as needed for severe pain. (Patient not taking: Reported on 01/04/2023) 40 tablet 0   traMADol (ULTRAM) 50 MG tablet Take 50 mg by mouth 3 (three) times daily as needed for pain. (Patient not taking: Reported on 01/04/2023)     No current facility-administered medications for this visit.     Review of Systems    She denies chest pain, palpitations, dyspnea, pnd, orthopnea, n, v, syncope, edema, weight gain, or early satiety. All other systems reviewed and are otherwise negative except as noted above.   Physical Exam    VS:  BP (!) 88/50   Pulse (!) 57   Ht 5\' 2"  (1.575 m)   Wt 160 lb (72.6 kg)   SpO2 98%   BMI 29.26 kg/m  GEN: Well nourished, well developed, in no acute distress. HEENT: normal. Neck: Supple, no JVD, carotid bruits, or masses. Cardiac: RRR, no murmurs, rubs, or gallops. No clubbing, cyanosis, edema.  Radials/DP/PT 2+ and equal bilaterally.  Respiratory:  Respirations regular and  unlabored, clear to auscultation bilaterally. GI: Soft, nontender, nondistended, BS + x 4. MS: no deformity or atrophy. Skin: warm and dry, no rash. Neuro:  Strength and sensation are intact. Psych: Normal affect.  Accessory Clinical Findings    ECG personally reviewed by me today - No EKG in office today.   Lab Results  Component Value Date   WBC 6.9 11/15/2021   HGB 11.1 (L) 11/15/2021   HCT 36.2 11/15/2021   MCV 79.7 (L) 11/15/2021   PLT 307 11/15/2021   Lab Results  Component Value Date   CREATININE 1.80 (H) 12/03/2021   BUN 26 12/03/2021   NA 142 12/03/2021   K 4.3 12/03/2021   CL 101 12/03/2021   CO2  26 12/03/2021   Lab Results  Component Value Date   ALT 10 11/15/2021   AST 18 11/15/2021   ALKPHOS 102 11/15/2021   BILITOT 0.7 11/15/2021   Lab Results  Component Value Date   CHOL 186 02/17/2021   HDL 43 02/17/2021   LDLCALC 102 (H) 02/17/2021   TRIG 239 (H) 02/17/2021   CHOLHDL 4.3 02/17/2021    Lab Results  Component Value Date   HGBA1C 6.2 (H) 09/21/2021    Assessment & Plan    1. Hypertension/orthostatic hypotension: She notes ongoing hypotension with associated lightheadedness, presyncope.  She denies syncope.  Will discontinue verapamil and Maxide.  Continue to monitor BP and report SBP consistently less than 100. Continue Lasix, metoprolol, isosorbide, and Ranexa.    2. CAD: Most recent cath in 2015 showed 50% p-mLAD stenosis with negative FFR, D1 70%, and patent RCA stent.  Echo in 2020 showed EF 60-65%, normal LV diastolic function, mild LVH, and mild AI. Stable with no anginal symptoms. No indication for ischemic evaluation. Continue aspirin, rosuvastatin, and current medications as above.   3. Hyperlipidemia: LDL was 55 in 02/2022. Continue Crestor.    4. OSA: She is not on on CPAP.  Stable.   5. CKD stage IV: Creatinine was 1.80 in 11/2021, recent labs not available for review.  Follows with nephrology.    6. Type 2 diabetes: A1c was 6.7 in  12/2022.  Monitored and managed per PCP.   8. Asthma: Stable. Follows with pulmonology.   9. Disposition: Follow-up in 1-2 months with APP, f/u as scheduled with Dr. Tresa Endo in 05/2023.       Joylene Grapes, NP 01/04/2023, 9:22 AM

## 2023-01-11 ENCOUNTER — Other Ambulatory Visit: Payer: Self-pay

## 2023-01-16 ENCOUNTER — Other Ambulatory Visit (HOSPITAL_COMMUNITY): Payer: Self-pay

## 2023-01-17 ENCOUNTER — Other Ambulatory Visit: Payer: Self-pay

## 2023-02-06 ENCOUNTER — Ambulatory Visit: Payer: Medicare Other | Attending: Nurse Practitioner | Admitting: Nurse Practitioner

## 2023-02-06 ENCOUNTER — Encounter: Payer: Self-pay | Admitting: Nurse Practitioner

## 2023-02-06 VITALS — BP 124/62 | HR 76 | Ht 62.0 in | Wt 159.2 lb

## 2023-02-06 DIAGNOSIS — G4733 Obstructive sleep apnea (adult) (pediatric): Secondary | ICD-10-CM

## 2023-02-06 DIAGNOSIS — I10 Essential (primary) hypertension: Secondary | ICD-10-CM

## 2023-02-06 DIAGNOSIS — E785 Hyperlipidemia, unspecified: Secondary | ICD-10-CM

## 2023-02-06 DIAGNOSIS — I2510 Atherosclerotic heart disease of native coronary artery without angina pectoris: Secondary | ICD-10-CM | POA: Diagnosis not present

## 2023-02-06 DIAGNOSIS — E118 Type 2 diabetes mellitus with unspecified complications: Secondary | ICD-10-CM

## 2023-02-06 DIAGNOSIS — N1832 Chronic kidney disease, stage 3b: Secondary | ICD-10-CM

## 2023-02-06 DIAGNOSIS — Z794 Long term (current) use of insulin: Secondary | ICD-10-CM

## 2023-02-06 DIAGNOSIS — I951 Orthostatic hypotension: Secondary | ICD-10-CM

## 2023-02-06 NOTE — Progress Notes (Signed)
Office Visit    Patient [name] Date of Encounter: 02/06/2023  Primary Care Provider:  Adrian Prince, MD Primary Cardiologist:  Nicki Guadalajara, MD  Chief Complaint    67 year old female with a history of CAD, hypertension, hyperlipidemia, OSA not on CPAP, hypothyroidism, type 2 diabetes, CKD stage IV, asthma, and GERD who presents for follow-up related to CAD and hypertension.    Past Medical History    Past Medical History:  Diagnosis Date   Acute bronchitis    Anemia    hx of anemia   Anginal pain (HCC)    Arthritis    osteo   Asthma    Chronic kidney disease (CKD), stage IV (severe) (HCC)    Coronary atherosclerosis    Cough    GERD (gastroesophageal reflux disease)    History of colon polyps    History of migraine    Hyperlipidemia    Hypertension    Obstructive sleep apnea (adult) (pediatric)    NO CPAP    Peripheral neuropathy    Type II or unspecified type diabetes mellitus without mention of complication, not stated as uncontrolled    type 2   Unspecified hypothyroidism    Past Surgical History:  Procedure Laterality Date   ANTERIOR FUSION CERVICAL SPINE  2009   CARPAL TUNNEL RELEASE Left    COLONOSCOPY     CORONARY STENT INTERVENTION  2007   LEFT HEART CATHETERIZATION WITH CORONARY ANGIOGRAM N/A 05/29/2014   Procedure: LEFT HEART CATHETERIZATION WITH CORONARY ANGIOGRAM;  Surgeon: Lesleigh Noe, MD;  Location: [facility] CATH LAB;  Service: Cardiovascular;  Laterality: N/A;   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   Procedure: TOTAL KNEE ARTHROPLASTY;  Surgeon: Jene Every, MD;  Location: [facility] ORS;  Service: Orthopedics;  Laterality: Left;  3 hrs   TOTAL KNEE ARTHROPLASTY Right 10/01/2021   Procedure: TOTAL KNEE ARTHROPLASTY;  Surgeon: Jene Every, MD;  Location: [facility];  Service: Orthopedics;  Laterality: Right;   TUBAL LIGATION  1990   WISDOM TOOTH EXTRACTION      Allergies  Allergies  Allergen Reactions   Allopurinol Rash    Toxic  epidermal necrolysis    Codeine Other (See Comments)    REACTION: hallucinations/"loopy"   Levaquin [Levofloxacin] Nausea And Vomiting   Sulfa Antibiotics Hives   Sulfonamide Derivatives Hives     Labs/Other Studies Reviewed    The following studies were reviewed today:  Cardiac Studies & Procedures       ECHOCARDIOGRAM  ECHOCARDIOGRAM COMPLETE 04/23/2019  Narrative ECHOCARDIOGRAM REPORT    Patient Name:   [name] Date of Exam: 04/23/2019 Medical Rec #:  [id number]         Height:       62.0 in Accession #:    [id number]        Weight:       187.0 lb Date of Birth:  February 07, 1956         BSA:          1.86 m Patient Age:    63 years          BP:           126/68 mmHg Patient Gender: F                 HR:           60 bpm. Exam Location:  [address]   Procedure: 2D Echo, Cardiac Doppler and Color Doppler  Indications:  R06.09 DOE  History:        Patient has prior history of Echocardiogram examinations, most recent 01/06/2016. CAD Signs/Symptoms: Chest Pain Risk Factors: Hypertension, Diabetes, Sleep Apnea and HLD.  Sonographer:    Clearence Ped RCS Referring Phys: [phone] THOMAS A KELLY  IMPRESSIONS   1. The left ventricle has normal systolic function with an ejection fraction of 60-65%. The cavity size was normal. There is mildly increased left ventricular wall thickness. Left ventricular diastolic parameters were normal. 2. The right ventricle has normal systolic function. The cavity was normal. 3. The tricuspid valve is grossly normal. 4. The aortic valve has an indeterminate number of cusps. Aortic valve regurgitation is mild by color flow Doppler. No stenosis of the aortic valve. 5. The aorta is normal unless otherwise noted. 6. Normal LV function; mild LVH; mild AI.  FINDINGS Left Ventricle: The left ventricle has normal systolic function, with an ejection fraction of 60-65%. The cavity size was normal. There is mildly increased left ventricular  wall thickness. Left ventricular diastolic parameters were normal.  Right Ventricle: The right ventricle has normal systolic function. The cavity was normal.  Left Atrium: Left atrial size was normal in size.  Right Atrium: Right atrial size was normal in size.  Interatrial Septum: No atrial level shunt detected by color flow Doppler.  Pericardium: There is no evidence of pericardial effusion.  Mitral Valve: The mitral valve is normal in structure. Mitral valve regurgitation is trivial by color flow Doppler.  Tricuspid Valve: The tricuspid valve is grossly normal. Tricuspid valve regurgitation was not visualized by color flow Doppler.  Aortic Valve: The aortic valve has an indeterminate number of cusps Aortic valve regurgitation is mild by color flow Doppler. There is No stenosis of the aortic valve.  Pulmonic Valve: The pulmonic valve was not well visualized. Pulmonic valve regurgitation is trivial by color flow Doppler.  Aorta: The aorta is normal unless otherwise noted.  Venous: The inferior vena cava is normal in size with greater than 50% respiratory variability.  Additional Comments: Normal LV function; mild LVH; mild AI.   +--------------+--------++ LEFT VENTRICLE         +----------------+---------++ +--------------+--------++ Diastology                PLAX 2D                +----------------+---------++ +--------------+--------++ LV e' lateral:  9.14 cm/s LVIDd:        4.36 cm  +----------------+---------++ +--------------+--------++ LV E/e' lateral:12.4      LVIDs:        2.98 cm  +----------------+---------++ +--------------+--------++ LV e' medial:   8.70 cm/s LV PW:        1.29 cm  +----------------+---------++ +--------------+--------++ LV E/e' medial: 13.0      LV IVS:       1.21 cm  +----------------+---------++ +--------------+--------++ LVOT diam:    2.00 cm  +--------------+--------++ LV SV:        51 ml     +--------------+--------++ LV SV Index:  26.17    +--------------+--------++ LVOT Area:    3.14 cm +--------------+--------++                        +--------------+--------++  +---------------+---------++ RIGHT VENTRICLE          +---------------+---------++ RV Basal diam: 2.88 cm   +---------------+---------++ RV S prime:    9.36 cm/s +---------------+---------++ TAPSE (M-mode):2.7 cm    +---------------+---------++  +---------------+-------++-----------++ LEFT ATRIUM  Index       +---------------+-------++-----------++ LA diam:       3.70 cm1.99 cm/m  +---------------+-------++-----------++ LA Vol (A2C):  49.7 ml26.75 ml/m +---------------+-------++-----------++ LA Vol (A4C):  61.3 ml32.99 ml/m +---------------+-------++-----------++ LA Biplane Vol:56.6 ml30.47 ml/m +---------------+-------++-----------++ +------------+---------++-----------++ RIGHT ATRIUM         Index       +------------+---------++-----------++ RA Area:    10.30 cm            +------------+---------++-----------++ RA Volume:  21.50 ml 11.57 ml/m +------------+---------++-----------++ +------------+-----------++ AORTIC VALVE            +------------+-----------++ LVOT Vmax:  98.30 cm/s  +------------+-----------++ LVOT Vmean: 66.900 cm/s +------------+-----------++ LVOT VTI:   0.245 m     +------------+-----------++ AR PHT:     834 msec    +------------+-----------++  +-------------+-------++ AORTA                +-------------+-------++ Ao Root diam:3.20 cm +-------------+-------++  +--------------+--------++ MITRAL VALVE              +--------------+-------+ +--------------+--------++    SHUNTS                MV Area (PHT):            +--------------+-------+ +--------------+--------++    Systemic VTI: 0.24 m  MV PHT:                    +--------------+-------+ +--------------+--------++    Systemic Diam:2.00 cm MV Decel Time:197 msec    +--------------+-------+ +--------------+--------++ +--------------+-----------++ MV E velocity:113.00 cm/s +--------------+-----------++ MV A velocity:72.80 cm/s  +--------------+-----------++ MV E/A ratio: 1.55        +--------------+-----------++   Olga Millers MD Electronically signed by Olga Millers MD Signature Date/Time: 04/23/2019/3:48:22 PM    Final            Recent Labs: No results found for requested labs within last 365 days.  Recent Lipid Panel    Component Value Date/Time   CHOL 186 02/17/2021 0947   TRIG 239 (H) 02/17/2021 0947   HDL 43 02/17/2021 0947   CHOLHDL 4.3 02/17/2021 0947   CHOLHDL 2.7 05/30/2014 0400   VLDL 31 05/30/2014 0400   LDLCALC 102 (H) 02/17/2021 0947    History of Present Illness    67 year old female with the above past medical history including CAD, hypertension, hyperlipidemia, OSA not on CPAP, hypothyroidism, type 2 diabetes, CKD stage IV, asthma, and GERD.   She is followed by Dr. Tresa Endo. She has a known history of CAD and is s/p DES-pRCA in 2007, she was noted to have 60-70% LAD and 20% LCx stenosis.  Repeat cardiac catheterization in 2010 in the setting of recurrent chest pain showed 60% LAD stenosis, 20-30% circumflex stenosis and a widely patent RCA stent. More recently she had a high risk stress test in 2015 and underwent repeat heart catheterization at that time showing 50% p-mLAD stenosis with negative FFR , D1 70%, and patent RCA stent. Her last echocardiogram in 04/2019 showed EF 60-65%, normal LV diastolic function, mild LVH, and mild AI. She has a history of orthostatic hypotension.  She was evaluated in the ED on 11/15/2021 with complaints of epigastric and chest pain. EKG was unremarkable, troponin was negative.  Her symptoms improved with Maalox. She has since struggled with hypotension.  Metoprolol was  decreased to 12.5 mg daily, verapamil was decreased to 80 mg daily.  She was last seen in the office on 01/04/2023 and noted ongoing hypotension with associated  lightheadedness, presyncope.  She denied symptoms concerning for angina, denied syncope.  Verapamil and Maxide were discontinued.  Maxide was reintroduced in the setting of lower extremity edema.  She presents today for follow-up.  Since her last visit she has done well from standpoint.  BP has been stable.  She denies any dizziness, presyncope, syncope.  She denies symptoms concerning for angina. Overall, she reports feeling well.  Home Medications    Current Outpatient Medications  Medication Sig Dispense Refill   albuterol (PROVENTIL HFA) 108 (90 Base) MCG/ACT inhaler Inhale 2 puffs into the lungs every 6 (six) hours as needed for wheezing or shortness of breath. For shortness of breath and wheezing 18 each 2   Ascorbic Acid (VITAMIN C PO) Take 1 tablet by mouth daily.     BIOTIN PO Take 500 mg by mouth daily.     Carboxymeth-Glyc-Polysorb PF (REFRESH OPTIVE MEGA-3) 0.5-1-0.5 % SOLN Place 1 drop into both eyes daily in the afternoon.     colchicine 0.6 MG tablet Take 0.6 mg by mouth as needed.     Cyanocobalamin (B-12 PO) Take 1,000 mcg by mouth daily.     Dexlansoprazole 30 MG capsule DR Take 30 mg by mouth as needed.     diazepam (VALIUM) 5 MG tablet Take 5 mg by mouth daily as needed for muscle spasms (cramps).     Diclofenac Sodium (VOLTAREN PO) Take by mouth daily.     Finerenone (KERENDIA) 10 MG TABS Take 10 mg by mouth daily.     fluticasone (FLONASE) 50 MCG/ACT nasal spray Place 2 sprays into both nostrils daily. (Patient taking differently: Place 2 sprays into both nostrils as needed.) 18.2 mL 11   fluticasone-salmeterol (ADVAIR HFA) 230-21 MCG/ACT inhaler Inhale 2 puffs into the lungs 2 (two) times daily. (Patient taking differently: Inhale 2 puffs into the lungs as needed.) 8 g 6   furosemide (LASIX) 40 MG tablet Take 1  tablet (40 mg total) by mouth daily. In the morning (Patient taking differently: Take 40-60 mg by mouth See admin instructions. Take 60 mg in the morning and 20 mg bedtime) 90 tablet 3   hydrocortisone 2.5 % cream Apply topically 2 (two) times daily. Apply as needed two times daily 30 g 0   insulin glargine (LANTUS SOLOSTAR) 100 UNIT/ML Solostar Pen Inject 30 Units into the skin as needed.     insulin lispro (HUMALOG) 100 UNIT/ML KwikPen Inject 5-10 Units into the skin as needed. Inject 5 units subcutaneously up to three times daily if CBG >200, inject 10 units up to three times daily if CBG >300     ipratropium-albuterol (DUONEB) 0.5-2.5 (3) MG/3ML SOLN Take 3 mLs by nebulization every 4 (four) hours as needed. 360 mL 11   isosorbide mononitrate (IMDUR) 30 MG 24 hr tablet TAKE ONE TABLET BY MOUTH DAILY 30 tablet 6   JARDIANCE 10 MG TABS tablet Take 10 mg by mouth daily.     Lancets (ONETOUCH DELICA PLUS LANCET33G) MISC Apply 1 each topically 4 (four) times daily.     levothyroxine (SYNTHROID) 150 MCG tablet Take 150 mcg by mouth daily before breakfast.     meclizine (ANTIVERT) 25 MG tablet Take 25 mg by mouth as needed.     Mepolizumab (NUCALA) 100 MG/ML SOAJ Inject 1 mL (100 mg total) into the skin every 28 (twenty-eight) days. 1 mL 2   Multiple Vitamin (MULTIVITAMIN WITH MINERALS) TABS tablet Take 1 tablet by mouth daily.     nitroGLYCERIN (NITROLINGUAL) 0.4  MG/SPRAY spray Place 1 spray under the tongue every 5 (five) minutes x 3 doses as needed for chest pain. 12 g 5   NOVOFINE PLUS PEN NEEDLE 32G X 4 MM MISC Inject into the skin.     ONE TOUCH ULTRA TEST test strip Use as directed     potassium chloride (KLOR-CON M) 10 MEQ tablet TAKE ONE TABLET BY MOUTH DAILY 90 tablet 3   pregabalin (LYRICA) 100 MG capsule Take 100 mg by mouth 2 (two) times daily.     Pyridoxine HCl (B-6 PO) Take 1 tablet by mouth daily.     ranolazine (RANEXA) 500 MG 12 hr tablet Take 1 tablet (500 mg total) by mouth 2  (two) times daily. 180 tablet 3   rosuvastatin (CRESTOR) 20 MG tablet TAKE ONE TABLET BY MOUTH DAILY 90 tablet 3   tirzepatide (MOUNJARO) 5 MG/0.5ML Pen Inject 5 mg into the skin once a week.     traMADol (ULTRAM) 50 MG tablet Take 50 mg by mouth 3 (three) times daily as needed for pain.     triamterene-hydrochlorothiazide (MAXZIDE-25) 37.5-25 MG tablet TAKE ONE TABLET BY MOUTH DAILY 90 tablet 3   XIIDRA 5 % SOLN      metoprolol succinate (TOPROL XL) 25 MG 24 hr tablet Take 1 tablet (25 mg total) by mouth daily. (Patient not taking: Reported on 02/06/2023) 90 tablet 3   oxyCODONE (OXY IR/ROXICODONE) 5 MG immediate release tablet Take 1 tablet (5 mg total) by mouth every 4 (four) hours as needed for severe pain. (Patient not taking: Reported on 02/06/2023) 40 tablet 0   Respiratory Therapy Supplies (FLUTTER) DEVI 1 puff by Does not apply route daily. (Patient not taking: Reported on 02/06/2023) 1 each 0   No current facility-administered medications for this visit.     Review of Systems    She denies chest pain, palpitations, dyspnea, pnd, orthopnea, n, v, dizziness, syncope, edema, weight gain, or early satiety. All other systems reviewed and are otherwise negative except as noted above.   Physical Exam    VS:  BP 124/62 (BP Location: Left Arm, Patient Position: Sitting, Cuff Size: Normal)   Pulse 76   Ht 5\' 2"  (1.575 m)   Wt 159 lb 3.2 oz (72.2 kg)   SpO2 100%   BMI 29.12 kg/m  GEN: Well nourished, well developed, in no acute distress. HEENT: normal. Neck: Supple, no JVD, carotid bruits, or masses. Cardiac: RRR, no murmurs, rubs, or gallops. No clubbing, cyanosis, edema.  Radials/DP/PT 2+ and equal bilaterally.  Respiratory:  Respirations regular and unlabored, clear to auscultation bilaterally. GI: Soft, nontender, nondistended, BS + x 4. MS: no deformity or atrophy. Skin: warm and dry, no rash. Neuro:  Strength and sensation are intact. Psych: Normal affect.  Accessory Clinical  Findings    ECG personally reviewed by me today -    No EKG in office today.  Lab Results  Component Value Date   WBC 6.9 11/15/2021   HGB 11.1 (L) 11/15/2021   HCT 36.2 11/15/2021   MCV 79.7 (L) 11/15/2021   PLT 307 11/15/2021   Lab Results  Component Value Date   CREATININE 1.80 (H) 12/03/2021   BUN 26 12/03/2021   NA 142 12/03/2021   K 4.3 12/03/2021   CL 101 12/03/2021   CO2 26 12/03/2021   Lab Results  Component Value Date   ALT 10 11/15/2021   AST 18 11/15/2021   ALKPHOS 102 11/15/2021   BILITOT 0.7 11/15/2021  Lab Results  Component Value Date   CHOL 186 02/17/2021   HDL 43 02/17/2021   LDLCALC 102 (H) 02/17/2021   TRIG 239 (H) 02/17/2021   CHOLHDL 4.3 02/17/2021    Lab Results  Component Value Date   HGBA1C 6.2 (H) 09/21/2021    Assessment & Plan   1. Hypertension/orthostatic hypotension: BP has normalized with discontinuation of verapamil.  She denies any recent dizziness, presyncope, syncope.  Continue to monitor BP and report SBP consistently less than 100. Continue Lasix, metoprolol, isosorbide, Ranexa and Maxide.    2. CAD: Most recent cath in 2015 showed 50% p-mLAD stenosis with negative FFR, D1 70%, and patent RCA stent.  Echo in 2020 showed EF 60-65%, normal LV diastolic function, mild LVH, and mild AI. Stable with no anginal symptoms. No indication for ischemic evaluation. Continue aspirin, rosuvastatin, and current medications as above.   3. Hyperlipidemia: LDL was 55 in 02/2022. Continue Crestor.    4. OSA: She is not on on CPAP.  Stable.   5. CKD stage IIIb-IV: Creatinine was 2.3 in 11/2022.  Follows with nephrology.    6. Type 2 diabetes: A1c was 6.7 in 12/2022.  Monitored and managed per PCP.   8. Asthma: Stable. Follows with pulmonology.    9. Disposition: Follow-up as scheduled with Dr. Tresa Endo in 05/2023.     Joylene Grapes, NP 02/06/2023, 10:05 AM

## 2023-02-06 NOTE — Patient Instructions (Signed)
Medication Instructions:  Your physician recommends that you continue on your current medications as directed. Please refer to the Current Medication list given to you today.  *If you need a refill on your cardiac medications before your next appointment, please call your pharmacy*   Lab Work: NONE ordered at this time of appointment    Testing/Procedures: NONE ordered at this time of appointment     Follow-Up: At [facility], you and your health needs are our priority.  As part of our continuing mission to provide you with exceptional heart care, we have created designated Provider Care Teams.  These Care Teams include your primary Cardiologist (physician) and Advanced Practice Providers (APPs -  Physician Assistants and Nurse Practitioners) who all work together to provide you with the care you need, when you need it.  We recommend signing up for the patient portal called "MyChart".  Sign up information is provided on this After Visit Summary.  MyChart is used to connect with patients for Virtual Visits (Telemedicine).  Patients are able to view lab/test results, encounter notes, upcoming appointments, etc.  Non-urgent messages can be sent to your provider as well.   To learn more about what you can do with MyChart, go to [email or web address].    Your next appointment:    Keep follow up   Provider:   Nicki Guadalajara, MD     Other Instructions

## 2023-02-15 ENCOUNTER — Other Ambulatory Visit (HOSPITAL_COMMUNITY): Payer: Self-pay

## 2023-02-15 ENCOUNTER — Other Ambulatory Visit: Payer: Self-pay | Admitting: Pulmonary Disease

## 2023-02-15 ENCOUNTER — Other Ambulatory Visit: Payer: Self-pay

## 2023-02-15 ENCOUNTER — Telehealth: Payer: Self-pay | Admitting: Pulmonary Disease

## 2023-02-15 DIAGNOSIS — J4551 Severe persistent asthma with (acute) exacerbation: Secondary | ICD-10-CM

## 2023-02-15 MED ORDER — NUCALA 100 MG/ML ~~LOC~~ SOAJ
100.0000 mg | SUBCUTANEOUS | 5 refills | Status: DC
Start: 2023-02-15 — End: 2023-08-10
  Filled 2023-02-15 – 2023-02-16 (×2): qty 1, 28d supply, fill #0
  Filled 2023-03-13: qty 1, 28d supply, fill #1
  Filled 2023-04-12: qty 1, 28d supply, fill #2
  Filled 2023-05-11: qty 1, 28d supply, fill #3
  Filled 2023-06-08: qty 1, 28d supply, fill #4
  Filled 2023-07-11: qty 1, 28d supply, fill #5

## 2023-02-15 NOTE — Telephone Encounter (Signed)
Refill sent for [facility] to [facility]: [phone]   Dose: 100 mg Sq every 4 weeks  Last OV: 11/29/22 Provider: Dr. Wynona Neat  Next OV: 6 months, not yet scheduled  Chesley Mires, PharmD, MPH, BCPS Clinical Pharmacist (Rheumatology and Pulmonology)

## 2023-02-16 ENCOUNTER — Other Ambulatory Visit: Payer: Self-pay

## 2023-02-16 ENCOUNTER — Other Ambulatory Visit (HOSPITAL_COMMUNITY): Payer: Self-pay

## 2023-02-17 ENCOUNTER — Other Ambulatory Visit (HOSPITAL_COMMUNITY): Payer: Self-pay

## 2023-02-20 ENCOUNTER — Other Ambulatory Visit: Payer: Self-pay

## 2023-02-20 ENCOUNTER — Other Ambulatory Visit: Payer: Self-pay | Admitting: *Deleted

## 2023-02-20 MED ORDER — ISOSORBIDE MONONITRATE ER 30 MG PO TB24: 30.0000 mg | ORAL_TABLET | Freq: Every day | ORAL | 11 refills | Status: DC

## 2023-02-20 MED ORDER — POTASSIUM CHLORIDE CRYS ER 10 MEQ PO TBCR: 10.0000 meq | EXTENDED_RELEASE_TABLET | Freq: Every day | ORAL | 3 refills | Status: DC

## 2023-02-25 IMAGING — DX DG KNEE 1-2V*R*
2 series · 2 of 2 positions shown · non-contrast
Comparison: 04/29/2010

CLINICAL DATA: Status post knee arthroplasty

EXAM:
RIGHT KNEE - 1-2 VIEW

[knee ap]
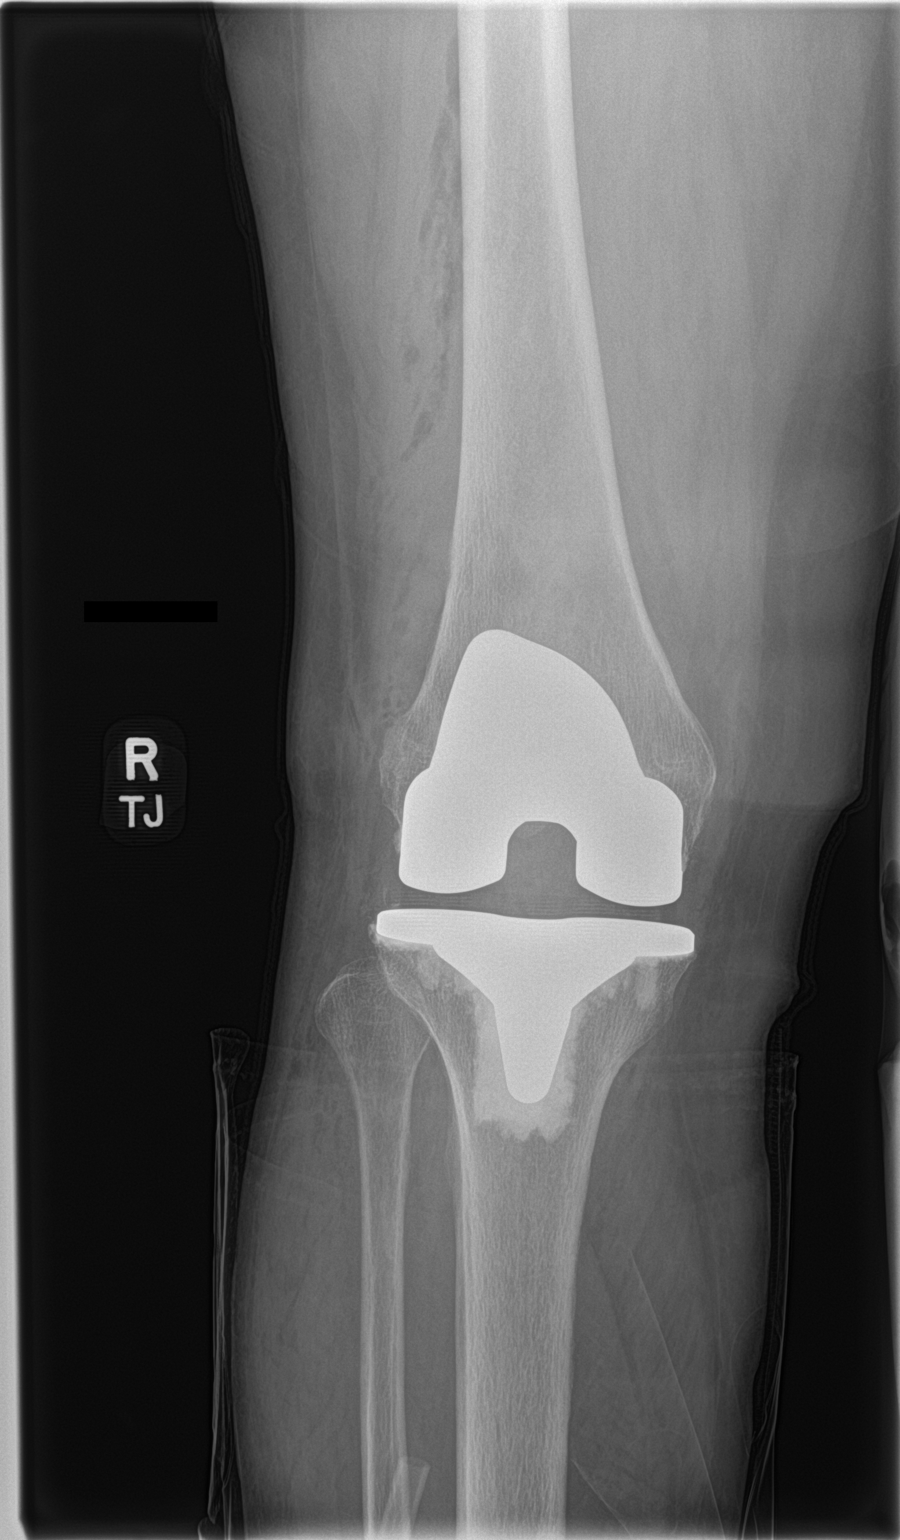

[knee lat]
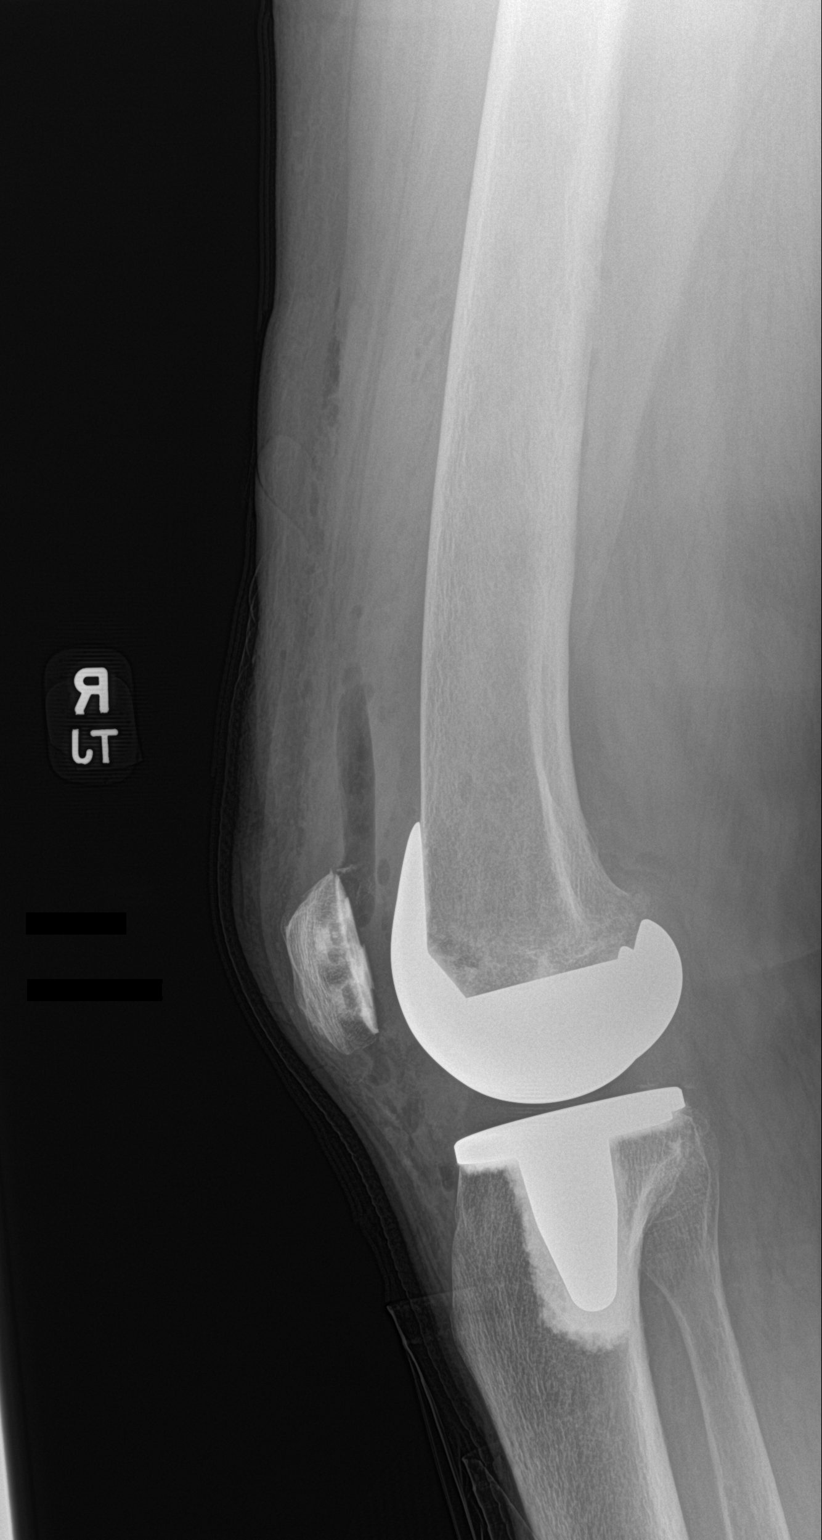

[2 of 2 positions shown; findings below may reference images not displayed]

FINDINGS: Interval total right knee arthroplasty. No perihardware lucency is
seen to indicate hardware failure or loosening. Expected
postoperative changes are seen including moderate joint effusion
with intra-articular air and distal anterior lateral thigh and knee
subcutaneous air. No acute fracture or dislocation.
IMPRESSION: Interval total right knee arthroplasty without evidence of hardware
failure.

## 2023-03-13 ENCOUNTER — Other Ambulatory Visit (HOSPITAL_COMMUNITY): Payer: Self-pay

## 2023-03-20 ENCOUNTER — Other Ambulatory Visit (HOSPITAL_COMMUNITY): Payer: Self-pay

## 2023-03-21 ENCOUNTER — Other Ambulatory Visit: Payer: Self-pay | Admitting: Cardiovascular Disease

## 2023-03-22 ENCOUNTER — Other Ambulatory Visit (HOSPITAL_COMMUNITY): Payer: Self-pay

## 2023-04-12 ENCOUNTER — Other Ambulatory Visit (HOSPITAL_COMMUNITY): Payer: Self-pay

## 2023-04-24 ENCOUNTER — Other Ambulatory Visit: Payer: Self-pay | Admitting: Cardiovascular Disease

## 2023-04-27 LAB — LAB REPORT - SCANNED
A1c: 6.1
EGFR: 30.1

## 2023-05-11 ENCOUNTER — Other Ambulatory Visit: Payer: Self-pay

## 2023-05-11 NOTE — Progress Notes (Signed)
Specialty Pharmacy Refill Coordination Note  [name] is a 67 y.o. female contacted today regarding refills of specialty medication(s) Mepolizumab   Patient requested Delivery   Delivery date: 05/18/23   Verified address: [address]  [place] [address]   Medication will be filled on 05/17/23.

## 2023-05-17 ENCOUNTER — Other Ambulatory Visit (HOSPITAL_COMMUNITY): Payer: Self-pay | Admitting: Cardiology

## 2023-05-17 ENCOUNTER — Encounter: Payer: Self-pay | Admitting: Cardiovascular Disease

## 2023-05-17 ENCOUNTER — Ambulatory Visit: Payer: Medicare Other | Attending: Cardiovascular Disease | Admitting: Cardiovascular Disease

## 2023-05-17 VITALS — BP 104/70 | HR 70 | Ht 62.0 in | Wt 160.6 lb

## 2023-05-17 DIAGNOSIS — I2510 Atherosclerotic heart disease of native coronary artery without angina pectoris: Secondary | ICD-10-CM

## 2023-05-17 DIAGNOSIS — E039 Hypothyroidism, unspecified: Secondary | ICD-10-CM

## 2023-05-17 DIAGNOSIS — Z794 Long term (current) use of insulin: Secondary | ICD-10-CM

## 2023-05-17 DIAGNOSIS — I10 Essential (primary) hypertension: Secondary | ICD-10-CM

## 2023-05-17 DIAGNOSIS — N184 Chronic kidney disease, stage 4 (severe): Secondary | ICD-10-CM | POA: Diagnosis not present

## 2023-05-17 DIAGNOSIS — E118 Type 2 diabetes mellitus with unspecified complications: Secondary | ICD-10-CM | POA: Diagnosis not present

## 2023-05-17 DIAGNOSIS — E785 Hyperlipidemia, unspecified: Secondary | ICD-10-CM | POA: Diagnosis not present

## 2023-05-17 NOTE — Patient Instructions (Signed)
Medication Instructions:  Take the Lasix 40mg  in the morning only.   Continue to take the Metoprolol Succinate 25mg  every other day. Take 12.5mg  (half of tablet) on the even days of the week. Tuesday, Thursday and Saturday.  Continue to monitor your blood pressure  *If you need a refill on your cardiac medications before your next appointment, please call your pharmacy*   Lab Work: None ordered today  If you have labs (blood work) drawn today and your tests are completely normal, you will receive your results only by: MyChart Message (if you have MyChart) OR A paper copy in the mail If you have any lab test that is abnormal or we need to change your treatment, we will call you to review the results.   Testing/Procedures: None   Follow-Up: At [facility], you and your health needs are our priority.  As part of our continuing mission to provide you with exceptional heart care, we have created designated Provider Care Teams.  These Care Teams include your primary Cardiologist (physician) and Advanced Practice Providers (APPs -  Physician Assistants and Nurse Practitioners) who all work together to provide you with the care you need, when you need it.  We recommend signing up for the patient portal called "MyChart".  Sign up information is provided on this After Visit Summary.  MyChart is used to connect with patients for Virtual Visits (Telemedicine).  Patients are able to view lab/test results, encounter notes, upcoming appointments, etc.  Non-urgent messages can be sent to your provider as well.   To learn more about what you can do with MyChart, go to [email or web address].    Your next appointment:   3 month(s)  Provider:   Bernadene Person, NP

## 2023-05-17 NOTE — Progress Notes (Signed)
Patient ID: [name], female   DOB: 01-04-1956, 67 y.o.   MRN: [id number]       PCP: Dr. Evlyn Kanner  HPI: [name] is a 67 y.o. female who presents to the office for a 21 month cardiology evaluation.   [name] has known coronary artery disease and in 2007 underwent insertion of a 3.0x13 mm Cypher DES stent to her proximal RCA. [name] did have concomitant CAD with 60-70% stenosis of her LAD and 20% circumflex stenosis. In July 2010 [name] develop recurrent chest pain and repeat catheterization showed 60% LAD stenosis, 20-30% circumflex stenosis and a widely patent RCA stent.  [name] has a history of hypertension, hyperlipidemia, hypothyroidism, diabetes mellitus, asthma, GERD, as well as intermittent leg swelling.  When I saw her in December, 2014, [name] had experienced some episodes of chest pain, which sounded musculoskeletal.  However, with her cardiac risk factor profile, and known CAD .  I recommended a nuclear stress test, but [name] did not have that done.  I last saw her in September 2015 and ultimately recommended that [name] undergo the nuclear stress study.  This was interpreted as a high risk study revealing a large moderate intensity reversible distal anterior apical and inferior defect.  As result, [name] underwent cardiac catheterization in October 2015.  Her catheterization findings were essentially unchanged from previously and showed a widely patent RCA stent.  There was segmental 50% proximal to mid LAD stenoses with an FFR of 0.94.  There were irregularities in the circumflex.  [name] had normal LV function.  [name] sees Dr. Evlyn Kanner and had laboratory in January 2017.  This was notable in that her cholesterol was 191, triglycerides 267, HDL 49, and LDL 89.  Gross was elevated at 190.  Her TSH was 0.11 and free T4 1 0.5.  Subsequent blood work in May 2017 showed a BUN of 24, creatinine 1.19.  Hemoglobin 10.9, hematocrit 34.0.  On 01/06/2016 [name] underwent an echo Doppler study which showed an  ejection fraction of 60-65% with grade 1 diastolic dysfunction.  There was trivial AR.  When I last saw her, [name] denied  any episodes of exertional chest tightness, but had rare episodes of chest wall discomfort which improves with aspirin.  [name] had occasional swelling of her ankles.  [name] also has a history of intermittent wheezing for which he takes Advair.  Laboratory by Dr. Evlyn Kanner had revealed significant triglyceride elevation and I recommended lovasa at 2 capsules twice a day.    When I saw her in May 2018 [name] had experienced occasional episodes of wheezing in the winter months.  [name] has sleep apnea and is on CPAP.  [name] denied any episodes of chest pressure.  [name] was unaware of palpitations.  [name] went to the emergency room on 11/28/2016 with shortness of breath.  [name] was given a breathing treatment in the emergency room with moderate relief of her symptoms.  [name] was felt to have mild asthma with exacerbation.  Laboratory  showed stable renal function with a creatinine 1.13.  [name] was anemic and is being followed by Dr. Evlyn Kanner with her hemoglobin of 9.3, hematocrit of 29.8.  BNP was normal at 59.9.   After not seeing her for 25 months, I  saw her on February 04, 2019.  [name] was without recurrent anginal symptomatology, however [name] had noticed increasing episodes of shortness of breath over the previous 6 to 7 months.  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  Seen Dr. Evlyn Kanner and her hemoglobin A1c was 6.6.  During that evaluation I recommended slight additional titration of her metoprolol succinate to 37.5 mg  which would be helpful both for more optimal blood pressure control as well as improvement in her baseline resting heart rate which was in the upper 80s.  [name] underwent a follow-up echo Doppler study on April 23, 2019 which showed normal systolic and diastolic parameters.  Ejection fraction was 60 to 65%.  There was mild left ventricular hypertrophy and mild aortic insufficiency.  I  saw her on May 06, 2019.  At that time, [name] complained of being fatigued and had noticed some mild lightheadedness and some exertional shortness of breath.  [name] was unaware of any wheezing and denied fever chills or night sweats.  [name] denied palpitations.  At that time, her blood pressure was low and I recommended [name] decrease isosorbide from 120 mg down to 60 mg and reduce Verapamil SR from 240 mg down to 120 mg.  I recommended [name] continue Toprol-XL 37.5 mg but to stagger this and take 12.5 mg in the morning and 25 mg at night.  [name] has been on levothyroxine 200 mcg for hypothyroidism followed by Dr. Evlyn Kanner and also was on [facility] for intermittent asthma.  [name] was on insulin and Victoza for her diabetes.  [name] was  seen by me in December 2020.  At that time her blood pressure had improved as has her lightheadedness.  An echo Doppler study in September 2020 showed an EF at 60 to 65% with mild LVH.  There was mild aortic insufficiency.  [name] has continued to use CPAP therapy.  [name] has had advance home care for her DME company.  Remotely [name] had been with choice and would like to be switched back to them.    [name] was evaluated by Judy Pimple, PA-C on November 04, 2020.  Previous to that evaluation [name] was evaluated by Azalee Course, PA in January 2022 with complaints of some mild chest discomfort.  [name] was started on Ranexa 500 mg twice a day.  When seen by Dot Lanes, her chest pain had improved.  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  I saw her on January 20, 2021.  At that time [name] had noted some mild lightheadedness.  [name] had been taking  furosemide and has been taking 40 mg in the morning and 20 mg later in the day, Jardiance 10 mg daily, isosorbide 120 mg, metoprolol succinate 37.5 mg twice a day dosing 500 mg twice daily, Maxide 37/25 mg daily and verapamil 120 mg mg daily.  [name] denied any chest tightness.  [name] has been followed by Dr. Evlyn Kanner who has been checking her laboratory.  [name] continues to be  on Jardiance and insulin for her diabetes and levothyroxine for hypothyroidism.  During that evaluation, [name] was orthostatic with her blood pressure 100/60 supine dropping to 78/60 while standing.  I recommended that since [name] was taking furosemide 40 mg in the morning and 20 mg in the afternoon that [name] discontinue her Maxide.  [name] also had been on metoprolol succinate 37.5 mg twice a day and I recommended [name] reduce her evening dose down to 25 mg.  Dr. Evlyn Kanner has been checking her laboratory.  Her creatinine was elevated at 1.9 but improved from 2.18.  [name] underwent subsequent lipid studies and triglycerides were 239, VLDL 41, and LDL at 102.  At the time [name] was on rosuvastatin 5 mg.  [name] was contacted  to increase rosuvastatin and also add Lovaza 1 capsule twice a day.  I saw her in March 19, 2021.  [name] tells me approximately 5 days after stopping her Maxide [name] began to notice more lower extremity edema.  [name] was trying to avoid sodium intake.  Ultimately [name] resumed taking her Maxide.  [name] saw Dr. Evlyn Kanner last week for laboratory and has a follow-up office visit later today.  [name] denied any chest pain PND orthopnea.  Her lightheadedness has improved.  I last saw her in August 10, 2021.  At that time [name] felt well.   [name] stopped using CPAP several years ago and admits that [name] is sleeping well.  There is no snoring.  Her sleep is restorative.  [name] is now on furosemide 40 mg daily, Maxide 37.5/25 mg for swelling, isosorbide 60 mg, metoprolol succinate 37.5 mg, verapamil 120 mg and ranolazine 500 mg twice a day.  [name] is not having any anginal symptomatology.  [name] is on Jardiance, insulin, Mounjaro injection weekly, for diabetes mellitus, [name] is on Joslyn Devon and is followed by nephrology.  [name] is on albuterol as needed, Singulair, and Nucala injection monthly for her asthma.  [name] is on rosuvastatin 20 mg and lovaza a 1 g twice a day for hyperlipidemia.    Since I last saw her, [name] was evaluated by Bernadene Person,  NP for cardiology evaluation on February 06, 2023.  [name] had lost significant weight.  And at that time was on Lasix, metoprolol, isosorbide, Ranexa and Maxide.  [name] was being followed by nephrology with creatinine of 2.3 in April 2024.  Presently, [name] feels well.  [name] has lost over 40 pounds since initiating Mounjaro.  [name] denies chest pain.  [name] has symptoms occasionally of indigestion.  [name] no longer uses CPAP and has not used this for at least 4 years.  [name] is no longer on verapamil.  [name] has been taking Lasix 40 in the morning and 20 at bedtime.  Her blood pressure has been on the low side.  [name] sees Dr. Adrian Prince at [facility] for primary care.  Recent laboratory from April 25, 2023 showed total cholesterol 192, HDL 48, LDL 96 and triglycerides were elevated at 241.  Creatinine was 2.3 in April 2024.  Hemoglobin A1c was 6.1 in September 2024.  [name] presents for reevaluation.  Past Medical History:  Diagnosis Date   Acute bronchitis    Anemia    hx of anemia   Anginal pain (HCC)    Arthritis    osteo   Asthma    Chronic kidney disease (CKD), stage IV (severe) (HCC)    Coronary atherosclerosis    Cough    GERD (gastroesophageal reflux disease)    History of colon polyps    History of migraine    Hyperlipidemia    Hypertension    Obstructive sleep apnea (adult) (pediatric)    NO CPAP    Peripheral neuropathy    Type II or unspecified type diabetes mellitus without mention of complication, not stated as uncontrolled    type 2   Unspecified hypothyroidism     Past Surgical History:  Procedure Laterality Date   ANTERIOR FUSION CERVICAL SPINE  2009   CARPAL TUNNEL RELEASE Left    COLONOSCOPY     CORONARY STENT INTERVENTION  2007   LEFT HEART CATHETERIZATION WITH CORONARY ANGIOGRAM N/A 05/29/2014   Procedure: LEFT HEART CATHETERIZATION WITH CORONARY ANGIOGRAM;  Surgeon: Lesleigh Noe, MD;  Location: [facility] CATH  LAB;  Service: Cardiovascular;  Laterality: N/A;   TOTAL KNEE  ARTHROPLASTY Left 02/05/2020   Procedure: TOTAL KNEE ARTHROPLASTY;  Surgeon: Jene Every, MD;  Location: [facility] ORS;  Service: Orthopedics;  Laterality: Left;  3 hrs   TOTAL KNEE ARTHROPLASTY Right 10/01/2021   Procedure: TOTAL KNEE ARTHROPLASTY;  Surgeon: Jene Every, MD;  Location: [facility] ORS;  Service: Orthopedics;  Laterality: Right;   TUBAL LIGATION  1990   WISDOM TOOTH EXTRACTION      Allergies  Allergen Reactions   Allopurinol Rash    Toxic epidermal necrolysis    Codeine Other (See Comments)    REACTION: hallucinations, funny in the head    Levaquin [Levofloxacin] Nausea And Vomiting   Sulfa Antibiotics Hives   Sulfonamide Derivatives Hives    Current Outpatient Medications  Medication Sig Dispense Refill   albuterol (PROVENTIL HFA) 108 (90 Base) MCG/ACT inhaler Inhale 2 puffs into the lungs every 6 (six) hours as needed for wheezing or shortness of breath. For shortness of breath and wheezing 18 each 2   Ascorbic Acid (VITAMIN C PO) Take 1 tablet by mouth daily.     BIOTIN PO Take 500 mg by mouth daily.     Carboxymeth-Glyc-Polysorb PF (REFRESH OPTIVE MEGA-3) 0.5-1-0.5 % SOLN Place 1 drop into both eyes daily in the afternoon.     colchicine 0.6 MG tablet Take 0.6 mg by mouth as needed.     Cyanocobalamin (B-12 PO) Take 1,000 mcg by mouth daily.     Dexlansoprazole 30 MG capsule DR Take 30 mg by mouth as needed.     diazepam (VALIUM) 5 MG tablet Take 5 mg by mouth daily as needed for muscle spasms (cramps).     Diclofenac Sodium (VOLTAREN PO) Take by mouth daily.     ezetimibe (ZETIA) 10 MG tablet Take 10 mg by mouth daily.     Finerenone (KERENDIA) 10 MG TABS Take 10 mg by mouth daily.     fluticasone (FLONASE) 50 MCG/ACT nasal spray Place 2 sprays into both nostrils daily. (Patient taking differently: Place 2 sprays into both nostrils as needed.) 18.2 mL 11   fluticasone-salmeterol (ADVAIR HFA) 230-21 MCG/ACT inhaler Inhale 2 puffs into the lungs 2 (two) times daily.  (Patient taking differently: Inhale 2 puffs into the lungs as needed.) 8 g 6   furosemide (LASIX) 40 MG tablet Take 1 tablet (40 mg total) by mouth daily. In the morning (Patient taking differently: Take 40-60 mg by mouth See admin instructions. Take 60 mg in the morning and 20 mg bedtime) 90 tablet 3   ipratropium-albuterol (DUONEB) 0.5-2.5 (3) MG/3ML SOLN Take 3 mLs by nebulization every 4 (four) hours as needed. 360 mL 11   isosorbide mononitrate (IMDUR) 30 MG 24 hr tablet Take 1 tablet (30 mg total) by mouth daily. 30 tablet 11   JARDIANCE 10 MG TABS tablet Take 10 mg by mouth daily.     Lancets (ONETOUCH DELICA PLUS LANCET33G) MISC Apply 1 each topically 4 (four) times daily.     levothyroxine (SYNTHROID) 150 MCG tablet Take 150 mcg by mouth daily before breakfast.     meclizine (ANTIVERT) 25 MG tablet Take 25 mg by mouth as needed.     Mepolizumab (NUCALA) 100 MG/ML SOAJ Inject 1 mL (100 mg total) into the skin every 28 (twenty-eight) days. 1 mL 5   metoprolol succinate (TOPROL XL) 25 MG 24 hr tablet Take 1 tablet (25 mg total) by mouth daily. (Patient taking differently: Take 12.5 mg by  mouth daily.) 90 tablet 3   Multiple Vitamin (MULTIVITAMIN WITH MINERALS) TABS tablet Take 1 tablet by mouth daily.     nitroGLYCERIN (NITROLINGUAL) 0.4 MG/SPRAY spray Place 1 spray under the tongue every 5 (five) minutes x 3 doses as needed for chest pain. 12 g 5   NOVOFINE PLUS PEN NEEDLE 32G X 4 MM MISC Inject into the skin.     ONE TOUCH ULTRA TEST test strip Use as directed     potassium chloride (KLOR-CON M) 10 MEQ tablet Take 1 tablet (10 mEq total) by mouth daily. 90 tablet 3   pregabalin (LYRICA) 100 MG capsule Take 100 mg by mouth 2 (two) times daily.     Pyridoxine HCl (B-6 PO) Take 1 tablet by mouth daily.     ranolazine (RANEXA) 500 MG 12 hr tablet Take 1 tablet (500 mg total) by mouth 2 (two) times daily. 180 tablet 3   rosuvastatin (CRESTOR) 20 MG tablet TAKE ONE TABLET BY MOUTH DAILY 90 tablet  3   tirzepatide (MOUNJARO) 5 MG/0.5ML Pen Inject 5 mg into the skin once a week.     triamterene-hydrochlorothiazide (MAXZIDE-25) 37.5-25 MG tablet TAKE 1 TABLET BY MOUTH DAILY 90 tablet 3   hydrocortisone 2.5 % cream Apply topically 2 (two) times daily. Apply as needed two times daily (Patient not taking: Reported on 05/17/2023) 30 g 0   insulin glargine (LANTUS SOLOSTAR) 100 UNIT/ML Solostar Pen Inject 30 Units into the skin as needed. (Patient not taking: Reported on 05/17/2023)     oxyCODONE (OXY IR/ROXICODONE) 5 MG immediate release tablet Take 1 tablet (5 mg total) by mouth every 4 (four) hours as needed for severe pain. (Patient not taking: Reported on 02/06/2023) 40 tablet 0   Respiratory Therapy Supplies (FLUTTER) DEVI 1 puff by Does not apply route daily. (Patient not taking: Reported on 02/06/2023) 1 each 0   traMADol (ULTRAM) 50 MG tablet Take 50 mg by mouth 3 (three) times daily as needed for pain. (Patient not taking: Reported on 05/17/2023)     XIIDRA 5 % SOLN  (Patient not taking: Reported on 05/17/2023)     No current facility-administered medications for this visit.    Social History   Socioeconomic History   Marital status: Widowed    Spouse name: Not on file   Number of children: 0   Years of education: Not on file   Highest education level: Not on file  Occupational History   Occupation: retired  Tobacco Use   Smoking status: Former    Current packs/day: 0.00    Average packs/day: 0.5 packs/day for 25.0 years (12.5 ttl pk-yrs)    Types: Cigarettes    Start date: 08/08/1978    Quit date: 08/09/2003    Years since quitting: 19.7   Smokeless tobacco: Never  Vaping Use   Vaping status: Never Used  Substance and Sexual Activity   Alcohol use: No    Alcohol/week: 0.0 standard drinks of alcohol   Drug use: No   Sexual activity: Not on file  Other Topics Concern   Not on file  Social History Narrative   Not on file   Social Determinants of Health   Financial Resource  Strain: Not on file  Food Insecurity: Not on file  Transportation Needs: Not on file  Physical Activity: Not on file  Stress: Not on file  Social Connections: Not on file  Intimate Partner Violence: Not on file   Social [name] is widowed. [name] does try to be  active. There is no tobacco use. [name] does drink occasional alcohol.  Family History  Problem Relation Age of Onset   Hypertension Mother    Heart disease Mother    Stroke Mother    Prostate cancer Father    Heart disease Brother    Heart attack Sister    ROS General: Negative; No fevers, chills, or night sweats; positive for 40 pound weight loss HEENT: Negative; No changes in vision or hearing, sinus congestion, difficulty swallowing Pulmonary: Negative; No cough, wheezing, shortness of breath, hemoptysis Cardiovascular: Negative; No chest pain, presyncope, syncope, palpitations Positive for rare, intermittent leg swelling GI: Positive for GERD; No nausea, vomiting, diarrhea, or abdominal pain GU: Negative; No dysuria, hematuria, or difficulty voiding Musculoskeletal: Positive for arthritis in her knees and hips; no myalgias, joint pain, or weakness Hematologic/Oncology: Negative; no easy bruising, bleeding Endocrine: Positive for hypothyroidism, and type 2 diabetes mellitus; Neuro: Negative; no changes in balance, headaches Skin: Negative; No rashes or skin lesions Psychiatric: Negative; No behavioral problems, depression Sleep: Positive OSA previously on CPAP, has not used in 4 to 5 years; no daytime sleepiness, hypersomnolence, bruxism, restless legs, hypnogognic hallucinations, no cataplexy Other comprehensive 14 point system review is negative.   PE BP 104/70   Pulse 70   Ht 5\' 2"  (1.575 m)   Wt 160 lb 9.6 oz (72.8 kg)   SpO2 97%   BMI 29.37 kg/m    Repeat blood pressure by me was 88/60  Wt Readings from Last 3 Encounters:  05/17/23 160 lb 9.6 oz (72.8 kg)  02/06/23 159 lb 3.2 oz (72.2 kg)  01/04/23 160 lb  (72.6 kg)   General: Alert, oriented, no distress.  Skin: normal turgor, no rashes, warm and dry HEENT: Normocephalic, atraumatic. Pupils equal round and reactive to light; sclera anicteric; extraocular muscles intact;  Nose without nasal septal hypertrophy Mouth/Parynx benign; Mallinpatti scale 3 Neck: No JVD, no carotid bruits; normal carotid upstroke Lungs: clear to ausculatation and percussion; no wheezing or rales Chest wall: without tenderness to palpitation Heart: PMI not displaced, RRR, s1 s2 normal, 1/6 systolic murmur, no diastolic murmur, no rubs, gallops, thrills, or heaves Abdomen: soft, nontender; no hepatosplenomehaly, BS+; abdominal aorta nontender and not dilated by palpation. Back: no CVA tenderness Pulses 2+ Musculoskeletal: full range of motion, normal strength, no joint deformities Extremities: no clubbing cyanosis or edema, Homan's sign negative  Neurologic: grossly nonfocal; Cranial nerves grossly wnl Psychologic: Normal mood and affect  No EKG was obtained today.  I personally reviewed ECG from November 14, 2022 which shows normal sinus rhythm at 76 bpm.  There are nonspecific ST changes  August 10, 2021 ECG (independently read by me): NSR at 67; normal intervals.  Baseline artifact.   March 19, 2021 ECG (independently read by me): NSR at 66, no significant STT changes; QTc 452 msec  January 20, 2021 ECG (independently read by me):  NSR at 76, no ectopy; QTc 452 msec  December 2020 ECG (independently read by me): NSRat 63; nonspecific T wave abnormality   September 2019 ECG (independently read by me): Normal sinus rhythm at 78 bpm.  No ectopy.  Normal intervals.     June 2020 ECG (independently read by me): NSR at 88; normal intervals. Nonspecific T changes.  May 2018 ECG (independently read by me): Sinus rhythm at 68 bpm.  Nonspecific T changes.  Normal intervals  September 2017 ECG (independently read by me): Normal sinus rhythm at 74 bpm with mild sinus  arrhythmia.  Mild  inferolateral T wave abnormalities.  September 2015 ECG (independently read by me): Normal sinus rhythm at 61 beats per minute.  QTc interval 4-6 Denise.  Nonspecific ST-T changes inferolaterally  December 2014 ECG: Normal sinus rhythm at 62 beats per minute. Nonspecific inferolateral T wave changes.  LABS:    Latest Ref Rng & Units 12/03/2021    2:48 PM 11/19/2021    3:49 PM 11/15/2021    3:39 PM  BMP  Glucose 70 - 99 mg/dL 94  295  621   BUN 8 - 27 mg/dL 26  50  40   Creatinine 0.57 - 1.00 mg/dL 3.08  6.57  8.46   BUN/Creat Ratio 12 - 28 14  24     Sodium 134 - 144 mmol/L 142  141  136   Potassium 3.5 - 5.2 mmol/L 4.3  3.3  3.2   Chloride 96 - 106 mmol/L 101  96  102   CO2 20 - 29 mmol/L 26  24  23    Calcium 8.7 - 10.3 mg/dL 9.2  9.4  8.7       Latest Ref Rng & Units 11/15/2021    3:39 PM 02/17/2021    9:47 AM 04/29/2020    3:33 AM  Hepatic Function  Total Protein 6.5 - 8.1 g/dL 7.9  7.5    Albumin 3.5 - 5.0 g/dL 3.8  4.0  2.9   AST 15 - 41 U/L 18  13    ALT 0 - 44 U/L 10  10    Alk Phosphatase 38 - 126 U/L 102  109    Total Bilirubin 0.3 - 1.2 mg/dL 0.7  0.3        Latest Ref Rng & Units 11/15/2021    3:39 PM 10/02/2021    9:15 AM 09/21/2021    1:28 PM  CBC  WBC 4.0 - 10.5 K/uL 6.9  10.7  6.1   Hemoglobin 12.0 - 15.0 g/dL 96.2  95.2  84.1   Hematocrit 36.0 - 46.0 % 36.2  31.8  35.6   Platelets 150 - 400 K/uL 307  229  252    Lab Results  Component Value Date   MCV 79.7 (L) 11/15/2021   MCV 80.9 10/02/2021   MCV 80.5 09/21/2021   Lab Results  Component Value Date   TSH 1.830 03/28/2018     Lab Results  Component Value Date   HGBA1C 6.2 (H) 09/21/2021   Lipid Panel     Component Value Date/Time   CHOL 186 02/17/2021 0947   TRIG 239 (H) 02/17/2021 0947   HDL 43 02/17/2021 0947   CHOLHDL 4.3 02/17/2021 0947   CHOLHDL 2.7 05/30/2014 0400   VLDL 31 05/30/2014 0400   LDLCALC 102 (H) 02/17/2021 0947    RADIOLOGY: No results  found.  IMPRESSION:  1. Coronary artery disease involving native coronary artery of native heart without angina pectoris   2. CKD (chronic kidney disease) stage 4, GFR 15-29 ml/min (HCC)   3. Type 2 diabetes mellitus with complication, with long-term current use of insulin (HCC)   4. Hyperlipidemia with target LDL less than 55   5. Hypothyroidism, unspecified type     ASSESSMENT AND PLAN: Denise. [name] is a 67 year old female with established CAD and is status post percutaneous coronary intervention to her proximal RCA in 2007.  [name] had a 60 - 70% stenosis of her LAD as well as 20-30%. [name] has experienced recurrent episodes of probable musculoskeletal chest discomfort.  A nuclear stress test  following in 2015 was interpreted as high risk; cardiac catheterization did not reveal significant change in her coronary anatomy and her previously placed RCA stent was widely patent.  Her LAD lesion was not physiologically significant by fractional flow reserve.  Due to exertional dyspnea, [name] underwent a follow-up echo Doppler study in September 2020 which revealed normal systolic and diastolic function with mild LVH and mild aortic insufficiency.  [name] was started on  Ranexa 500 mg twice a day by Azalee Course, PA for chest pain which did improve her symptomatology.  At her visit with me in June 2022 her blood pressure was low and [name] was orthostatic.  At that time since [name] was taking furosemide 40 mg in the morning and 20 mg later in the day I recommended [name] discontinue her Maxide.  Apparently [name] developed recurrent leg swelling approximately 4 to 5 days later and admits to an 8 pound weight gain predominantly of fluid.  [name] therefore resumed her Maxide.  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 Melanee Spry at  [facility] and was started on Kerendia 10 mg daily for renal preservation.  At my last visit with her in January 2023 her blood pressure was low and I decreased isosorbide down to 30 mg.  Since I saw her,  [name] has been on [facility] and admits to a total of 40 pound weight loss.  Her blood pressure today is low, and initially was 104/70 when [name] walked into the office but when rechecked by me was lower at 88/60.  Side.  I am recommending [name] decrease her Lasix from 40 mg in the morning and 20 mg at night and only take the morning dose.  I also am slightly changing her metoprolol succinate from 25 mg daily to 25 mg alternating with 12.5 mg every other day.  If blood pressure remains low [name] will decrease this to just 12.5 mg daily.  [name] continues to be on ranolazine 500 mg twice a day and is not having anginal symptoms.  [name] is on rosuvastatin 20 mg daily.  Most recent LDL in September was 96.  [name] would benefit from the addition of Zetia 10 mg with target LDL less than 70.  [name] will follow-up with Dr. Evlyn Kanner with her diabetes and hypothyroidism, currently on [place] as well as levothyroxine.  I am scheduling her to see Bernadene Person, NP in 3 months and I will see her subsequent to [facility] evaluation.      Lennette Bihari, MD, [facility]  05/20/2023 4:42 PM

## 2023-05-20 ENCOUNTER — Encounter: Payer: Self-pay | Admitting: Cardiovascular Disease

## 2023-06-08 ENCOUNTER — Other Ambulatory Visit: Payer: Self-pay | Admitting: Cardiovascular Disease

## 2023-06-08 ENCOUNTER — Other Ambulatory Visit: Payer: Self-pay

## 2023-06-08 NOTE — Progress Notes (Signed)
Specialty Pharmacy Refill Coordination Note  KEANNE FETTIG is a 67 y.o. female contacted today regarding refills of specialty medication(s) Mepolizumab   Patient requested Delivery   Delivery date: 06/15/23   Verified address: [address]  [place] [address]   Medication will be filled on 06/14/23.

## 2023-06-16 ENCOUNTER — Other Ambulatory Visit: Payer: Self-pay

## 2023-06-19 ENCOUNTER — Telehealth: Payer: Self-pay | Admitting: Pulmonary Disease

## 2023-06-19 ENCOUNTER — Telehealth: Payer: Self-pay | Admitting: Pharmacist

## 2023-06-19 ENCOUNTER — Other Ambulatory Visit: Payer: Self-pay | Admitting: Pulmonary Disease

## 2023-06-19 ENCOUNTER — Encounter: Payer: Self-pay | Admitting: Pulmonary Disease

## 2023-06-19 MED ORDER — CLOTRIMAZOLE 10 MG MT TROC: 10.0000 mg | Freq: Every day | OROMUCOSAL | 0 refills | Status: AC

## 2023-06-19 NOTE — Telephone Encounter (Signed)
Patient due for Nucala PA renewal (expires 08/08/23)  CMM is having technical errors so can process when portal is functional  [name], PharmD, MPH, BCPS, CPP Clinical Pharmacist (Rheumatology and Pulmonology)

## 2023-06-19 NOTE — Telephone Encounter (Signed)
Called and spoke to patient. She would like a Rx for thrush. C/o white patches on tongue and in mouth and mouth soreness x4d.  She rinses her mouth out after using Advair.    Patient stated that she is due for re certification for Nucala prior to Dec. Pharmacy team, please advise. Thanks

## 2023-06-19 NOTE — Telephone Encounter (Signed)
Patient states having symptom of thrush. Would like something called into pharmacy. Pharmacy is [facility]. Also needs refill for Nucala. Patient phone number is [phone].

## 2023-06-19 NOTE — Telephone Encounter (Signed)
Pharmacy still has a refill remaining for Nucala. They will send refill request to [place] when needed.  Chesley Mires, PharmD, MPH, BCPS, CPP Clinical Pharmacist (Rheumatology and Pulmonology)

## 2023-06-19 NOTE — Telephone Encounter (Signed)
Per Dr. Wynona Neat via epic secure chat-- Mycelex has been sent to preferred pharmacy.   Patient is aware and voiced her understanding.  Nothing further needed.

## 2023-06-19 NOTE — Telephone Encounter (Signed)
Submitted a Prior Authorization renewal request to [facility] for NUCALA via CoverMyMeds. Will update once we receive a response.  Key: [id number]   Per automated response: This medication or product was previously approved on [id number] from 2022-08-08 to 2023-08-08.   Will need to submit PA with new year

## 2023-06-19 NOTE — Telephone Encounter (Signed)
Will run PA renewal for Nucala (expires 08/08/2023)

## 2023-07-11 ENCOUNTER — Other Ambulatory Visit: Payer: Self-pay

## 2023-07-11 ENCOUNTER — Other Ambulatory Visit (HOSPITAL_COMMUNITY): Payer: Self-pay | Admitting: Pharmacy Technician

## 2023-07-11 ENCOUNTER — Other Ambulatory Visit (HOSPITAL_COMMUNITY): Payer: Self-pay

## 2023-07-11 NOTE — Progress Notes (Signed)
Specialty Pharmacy Refill Coordination Note  [name] is a 67 y.o. female contacted today regarding refills of specialty medication(s) Mepolizumab   Patient requested Delivery   Delivery date: 07/20/23   Verified address: [address] [place]   Medication will be filled on 07/19/23.

## 2023-07-11 NOTE — Progress Notes (Signed)
Specialty Pharmacy Ongoing Clinical Assessment Note  [name] is a 67 y.o. female who is being followed by the specialty pharmacy service for RxSp Asthma/COPD   Patient's specialty medication(s) reviewed today: Mepolizumab   Missed doses in the last 4 weeks: 0   Patient/Caregiver did not have any additional questions or concerns.   Therapeutic benefit summary: Patient is achieving benefit   Adverse events/side effects summary: No adverse events/side effects   Patient's therapy is appropriate to: Continue    Goals Addressed             This Visit's Progress    Reduce disease symptoms including coughing and shortness of breath       Patient is on track. Patient will maintain adherence and avoid flare triggers. Patient states that she does have some wheezing approximately 2 days before her next injection is due but it remains tolerable. She will notify provider if it increases in severity or length of time. She also reports having to use her inhaler if she is in a very dusty area but does not need it if she avoids dust.          Follow up:  6 months  Otto Herb Specialty Pharmacist

## 2023-07-19 ENCOUNTER — Other Ambulatory Visit: Payer: Self-pay

## 2023-07-20 ENCOUNTER — Other Ambulatory Visit (HOSPITAL_COMMUNITY): Payer: Self-pay

## 2023-07-24 ENCOUNTER — Ambulatory Visit (HOSPITAL_COMMUNITY)
Admission: RE | Admit: 2023-07-24 | Discharge: 2023-07-24 | Disposition: A | Discharge status: Home / Self Care | Payer: Medicare Other | Source: Ambulatory Visit | Attending: Cardiology | Admitting: Cardiology

## 2023-07-24 DIAGNOSIS — I080 Rheumatic disorders of both mitral and aortic valves: Secondary | ICD-10-CM | POA: Insufficient documentation

## 2023-07-24 DIAGNOSIS — I10 Essential (primary) hypertension: Secondary | ICD-10-CM | POA: Insufficient documentation

## 2023-07-24 DIAGNOSIS — E785 Hyperlipidemia, unspecified: Secondary | ICD-10-CM | POA: Diagnosis not present

## 2023-07-24 DIAGNOSIS — G4730 Sleep apnea, unspecified: Secondary | ICD-10-CM | POA: Insufficient documentation

## 2023-07-24 DIAGNOSIS — Z87891 Personal history of nicotine dependence: Secondary | ICD-10-CM | POA: Insufficient documentation

## 2023-07-24 LAB — ECHOCARDIOGRAM COMPLETE
AR max vel: 2.12 cm2
AV Area VTI: 2.24 cm2
AV Area mean vel: 2.01 cm2
AV Mean grad: 5 mm[Hg]
AV Peak grad: 9.5 mm[Hg]
Ao pk vel: 1.54 m/s
Area-P 1/2: 4.17 cm2
S' Lateral: 2.8 cm

## 2023-07-24 NOTE — Progress Notes (Signed)
Normal echo with minor abnormality

## 2023-07-26 ENCOUNTER — Other Ambulatory Visit: Payer: Self-pay

## 2023-07-27 ENCOUNTER — Other Ambulatory Visit: Payer: Self-pay

## 2023-07-27 ENCOUNTER — Other Ambulatory Visit (HOSPITAL_COMMUNITY): Payer: Self-pay

## 2023-07-27 NOTE — Progress Notes (Signed)
Clinical Intervention Note  Clinical Intervention Notes: Patient reported potential malfunction with Nucala injection on 12/17. Called patient back to discuss Nucala administration. Patient stated it was not a good time to talk and she would call the pharmacy back. Per Nucala PI, If the autoinjector is removed before the second click, the stopper has stopped moving, or if the inspection window is not filled with the yellow indicator, she may not have recieved the full dose. The manufacturer [employer] recommends for patient to call [phone] for more information.   Clinical Intervention Outcomes: Improved therapy adherence   Bobette Mo Specialty Pharmacist

## 2023-07-27 NOTE — Progress Notes (Signed)
Ms. Agyeman called back, we discussed the malfunction of her Nucala device, it was determined that she did receive at least a partial dose, in which case I informed her not to redose as it is not possible to determine how much of the dose was received. She was provided with the [employer] number to report the malfunction and said she would call them today.  All questions were answered.   Crista Luria, PharmD

## 2023-08-01 ENCOUNTER — Telehealth: Payer: Self-pay | Admitting: Cardiovascular Disease

## 2023-08-01 NOTE — Telephone Encounter (Signed)
Normal echo with minor abnormality  Sonda Rumble

## 2023-08-01 NOTE — Telephone Encounter (Signed)
The patient has been notified of the result and verbalized understanding.  All questions (if any) were answered.  Phone  call dropped. RN called back left detailed message of echo result any question may call back . Tobin Chad, RN 08/01/2023 8:58 AM

## 2023-08-01 NOTE — Telephone Encounter (Signed)
Follow Up: ? ? ? ?Patient is returning call, concerning her Echo results. ?

## 2023-08-10 ENCOUNTER — Other Ambulatory Visit: Payer: Self-pay | Admitting: Pulmonary Disease

## 2023-08-10 ENCOUNTER — Other Ambulatory Visit: Payer: Self-pay

## 2023-08-10 ENCOUNTER — Telehealth: Payer: Self-pay | Admitting: Pharmacist

## 2023-08-10 DIAGNOSIS — J4551 Severe persistent asthma with (acute) exacerbation: Secondary | ICD-10-CM

## 2023-08-10 NOTE — Progress Notes (Signed)
 Specialty Pharmacy Refill Coordination Note  [name] is a 68 y.o. female contacted today regarding refills of specialty medication(s) Mepolizumab  (Nucala )   Patient requested Delivery   Delivery date: 08/22/23   Verified address: [address]   [place] [address]   Medication will be filled on 08/21/23 pending a refill request.

## 2023-08-10 NOTE — Telephone Encounter (Signed)
 Submitted a Prior Authorization RENEWAL request to [facility] for NUCALA via CoverMyMeds. Will update once we receive a response.  Key: [id number]

## 2023-08-11 ENCOUNTER — Other Ambulatory Visit: Payer: Self-pay

## 2023-08-11 MED ORDER — NUCALA 100 MG/ML ~~LOC~~ SOAJ
100.0000 mg | SUBCUTANEOUS | 0 refills | Status: DC
  Filled 2023-08-11: qty 1, 28d supply, fill #0

## 2023-08-11 NOTE — Telephone Encounter (Signed)
 Refill sent for NUCALA  to [facility]: [phone]   Dose: 100mg  SQ every 4 weeks  Last OV: 11/29/2022 Provider: Dr. Neda  Next OV: 09/05/2023  Routing to scheduling team for follow-up on appt scheduling  Sherry Pennant, PharmD, MPH, BCPS Clinical Pharmacist (Rheumatology and Pulmonology)

## 2023-08-17 NOTE — Progress Notes (Signed)
 Office Visit    Patient [name] Date of Encounter: 08/18/2023  Primary Care Provider:  Nichole Senior, MD Primary Cardiologist:  Debby Sor, MD  Chief Complaint    68 year old female with a history of CAD, mild aortic valve regurgitation, hypertension, hyperlipidemia, OSA not on CPAP, hypothyroidism, type 2 diabetes, CKD stage IV, asthma, and GERD who presents for follow-up related to CAD and hypertension.   Past Medical History    Past Medical History:  Diagnosis Date   Acute bronchitis    Anemia    hx of anemia   Anginal pain (HCC)    Arthritis    osteo   Asthma    Chronic kidney disease (CKD), stage IV (severe) (HCC)    Coronary atherosclerosis    Cough    GERD (gastroesophageal reflux disease)    History of colon polyps    History of migraine    Hyperlipidemia    Hypertension    Obstructive sleep apnea (adult) (pediatric)    NO CPAP    Peripheral neuropathy    Type II or unspecified type diabetes mellitus without mention of complication, not stated as uncontrolled    type 2   Unspecified hypothyroidism    Past Surgical History:  Procedure Laterality Date   ANTERIOR FUSION CERVICAL SPINE  2009   CARPAL TUNNEL RELEASE Left    COLONOSCOPY     CORONARY STENT INTERVENTION  2007   LEFT HEART CATHETERIZATION WITH CORONARY ANGIOGRAM N/A 05/29/2014   Procedure: LEFT HEART CATHETERIZATION WITH CORONARY ANGIOGRAM;  Surgeon: Victory LELON Claudene DOUGLAS, MD;  Location: [facility] CATH LAB;  Service: Cardiovascular;  Laterality: N/A;   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   Procedure: TOTAL KNEE ARTHROPLASTY;  Surgeon: Duwayne Purchase, MD;  Location: [facility] ORS;  Service: Orthopedics;  Laterality: Left;  3 hrs   TOTAL KNEE ARTHROPLASTY Right 10/01/2021   Procedure: TOTAL KNEE ARTHROPLASTY;  Surgeon: Duwayne Purchase, MD;  Location: [facility] ORS;  Service: Orthopedics;  Laterality: Right;   TUBAL LIGATION  1990   WISDOM TOOTH EXTRACTION      Allergies  Allergies  Allergen  Reactions   Allopurinol Rash    Toxic epidermal necrolysis    Codeine Other (See Comments)    REACTION: hallucinations, funny in the head    Levaquin [Levofloxacin] Nausea And Vomiting   Sulfa Antibiotics Hives   Sulfonamide Derivatives Hives     Labs/Other Studies Reviewed    The following studies were reviewed today:  Cardiac Studies & Procedures      ECHOCARDIOGRAM  ECHOCARDIOGRAM COMPLETE 07/24/2023  Narrative ECHOCARDIOGRAM REPORT    Patient Name:   [name] Date of Exam: 07/24/2023 Medical Rec #:  [id number]         Height:       62.0 in Accession #:    [id number]        Weight:       160.6 lb Date of Birth:  04/11/1956         BSA:          1.741 m Patient Age:    67 years          BP:           151/79 mmHg Patient Gender: F                 HR:           84 bpm. Exam Location:  Outpatient  Procedure: 2D Echo, 3D Echo, Cardiac Doppler, Color  Doppler and Strain Analysis  Indications:    Essential Hypertension  History:        Patient has prior history of Echocardiogram examinations, most recent 04/23/2019. Risk Factors:Hypertension, Dyslipidemia, Former Smoker and Sleep Apnea.  Sonographer:    Ozell Free Referring Phys: [id number] GORDY BERGAMO   Sonographer Comments: Global longitudinal strain was attempted. IMPRESSIONS   1. Left ventricular ejection fraction, by estimation, is 60 to 65%. The left ventricle has normal function. The left ventricle has no regional wall motion abnormalities. Left ventricular diastolic parameters are consistent with Grade I diastolic dysfunction (impaired relaxation). Elevated left atrial pressure. The average left ventricular global longitudinal strain is -21.8 %. The global longitudinal strain is normal. 2. Right ventricular systolic function is normal. The right ventricular size is normal. 3. The mitral valve is normal in structure. No evidence of mitral valve regurgitation. No evidence of mitral stenosis. 4. The aortic valve  has an indeterminant number of cusps. Aortic valve regurgitation is mild. No aortic stenosis is present. 5. The inferior vena cava is normal in size with greater than 50% respiratory variability, suggesting right atrial pressure of 3 mmHg.  FINDINGS Left Ventricle: Left ventricular ejection fraction, by estimation, is 60 to 65%. The left ventricle has normal function. The left ventricle has no regional wall motion abnormalities. The average left ventricular global longitudinal strain is -21.8 %. The global longitudinal strain is normal. The left ventricular internal cavity size was normal in size. There is no left ventricular hypertrophy. Left ventricular diastolic parameters are consistent with Grade I diastolic dysfunction (impaired relaxation). Elevated left atrial pressure.  Right Ventricle: The right ventricular size is normal. Right ventricular systolic function is normal.  Left Atrium: Left atrial size was normal in size.  Right Atrium: Right atrial size was normal in size.  Pericardium: There is no evidence of pericardial effusion.  Mitral Valve: The mitral valve is normal in structure. No evidence of mitral valve regurgitation. No evidence of mitral valve stenosis.  Tricuspid Valve: The tricuspid valve is normal in structure. Tricuspid valve regurgitation is not demonstrated. No evidence of tricuspid stenosis.  Aortic Valve: The aortic valve has an indeterminant number of cusps. Aortic valve regurgitation is mild. No aortic stenosis is present. Aortic valve mean gradient measures 5.0 mmHg. Aortic valve peak gradient measures 9.5 mmHg. Aortic valve area, by VTI measures 2.24 cm.  Pulmonic Valve: The pulmonic valve was not well visualized. Pulmonic valve regurgitation is not visualized. No evidence of pulmonic stenosis.  Aorta: The aortic root is normal in size and structure.  Venous: The inferior vena cava is normal in size with greater than 50% respiratory variability, suggesting  right atrial pressure of 3 mmHg.  IAS/Shunts: No atrial level shunt detected by color flow Doppler.   LEFT VENTRICLE PLAX 2D LVIDd:         4.30 cm   Diastology LVIDs:         2.80 cm   LV e' medial:    5.33 cm/s LV PW:         1.10 cm   LV E/e' medial:  16.0 LV IVS:        1.10 cm   LV e' lateral:   11.10 cm/s LVOT diam:     2.00 cm   LV E/e' lateral: 7.7 LV SV:         64 LV SV Index:   37        2D Longitudinal Strain LVOT Area:     3.14  cm  2D Strain GLS Avg:     -21.8 %  3D Volume EF: 3D EF:        71 % LV EDV:       73 ml LV ESV:       21 ml LV SV:        52 ml  RIGHT VENTRICLE             IVC RV Basal diam:  4.00 cm     IVC diam: 1.20 cm RV S prime:     14.60 cm/s TAPSE (M-mode): 1.9 cm  LEFT ATRIUM             Index        RIGHT ATRIUM           Index LA diam:        3.60 cm 2.07 cm/m   RA Area:     22.10 cm LA Vol (A2C):   31.8 ml 18.26 ml/m  RA Volume:   68.50 ml  39.33 ml/m LA Vol (A4C):   29.1 ml 16.71 ml/m LA Biplane Vol: 31.3 ml 17.97 ml/m AORTIC VALVE AV Area (Vmax):    2.12 cm AV Area (Vmean):   2.01 cm AV Area (VTI):     2.24 cm AV Vmax:           154.00 cm/s AV Vmean:          104.000 cm/s AV VTI:            0.286 m AV Peak Grad:      9.5 mmHg AV Mean Grad:      5.0 mmHg LVOT Vmax:         104.00 cm/s LVOT Vmean:        66.600 cm/s LVOT VTI:          0.204 m LVOT/AV VTI ratio: 0.71  AORTA Ao Root diam: 2.60 cm Ao Asc diam:  2.90 cm  MITRAL VALVE MV Area (PHT): 4.17 cm    SHUNTS MV Decel Time: 182 msec    Systemic VTI:  0.20 m MV E velocity: 85.20 cm/s  Systemic Diam: 2.00 cm MV A velocity: 96.40 cm/s MV E/A ratio:  0.88  Redell Shallow MD Electronically signed by Redell Shallow MD Signature Date/Time: 07/24/2023/2:32:11 PM    Final            Recent Labs: No results found for requested labs within last 365 days.  Recent Lipid Panel    Component Value Date/Time   CHOL 186 02/17/2021 0947   TRIG 239 (H) 02/17/2021 0947    HDL 43 02/17/2021 0947   CHOLHDL 4.3 02/17/2021 0947   CHOLHDL 2.7 05/30/2014 0400   VLDL 31 05/30/2014 0400   LDLCALC 102 (H) 02/17/2021 0947    History of Present Illness    68 year old female with the above past medical history including CAD, mild aortic valve regurgitation, hypertension, hyperlipidemia, OSA not on CPAP, hypothyroidism, type 2 diabetes, CKD stage IV, asthma, and GERD.   She is followed by Dr. Burnard. She has a known history of CAD and is s/p DES-pRCA in 2007, she was noted to have 60-70% LAD and 20% LCx stenosis.  Repeat cardiac catheterization in 2010 in the setting of recurrent chest pain showed 60% LAD stenosis, 20-30% circumflex stenosis and a widely patent RCA stent. More recently she had a high risk stress test in 2015 and underwent repeat heart catheterization at that time showing 50% p-mLAD stenosis with negative  FFR , D1 70%, and patent RCA stent. Her last echocardiogram in 04/2019 showed EF 60-65%, normal LV diastolic function, mild LVH, and mild AI. She has a history of orthostatic hypotension.  She was evaluated in the ED on 11/15/2021 with complaints of epigastric and chest pain. EKG was unremarkable, troponin was negative.  Her symptoms improved with Maalox. She has struggled with hypotension and has undergone several medication changes.  She was last seen in office on 05/17/2023 and was stable from a cardiac standpoint.  Her BP was borderline low, she was advised to decrease her Lasix  to 20 mg daily, additionally, her metoprolol  was decreased to 25 mg daily alternating with 12.5 mg daily every other day.  Echocardiogram in 07/2023 showed EF 60 to 65%, normal infection, no RWMA, G1 DD, normal RV, mild aortic valve regurgitation.  She presents today for follow-up.  Since her last visit she done well from a cardiac standpoint.  Her [birth year] is [birth year].  She has noted a few episodes of lightheadedness associated with low blood pressure.  In the setting, she has held her  evening dose of blood pressure medication.  She denies any chest pain, palpitations, dyspnea, presyncope or syncope.  She has been dealing with some hair loss and has seen dermatology for this.  Overall, she reports feeling well.  Home Medications    Current Outpatient Medications  Medication Sig Dispense Refill   albuterol  (PROVENTIL  HFA) 108 (90 Base) MCG/ACT inhaler Inhale 2 puffs into the lungs every 6 (six) hours as needed for wheezing or shortness of breath. For shortness of breath and wheezing 18 each 2   Ascorbic Acid  (VITAMIN C PO) Take 1 tablet by mouth daily.     BIOTIN PO Take 500 mg by mouth daily.     Carboxymeth-Glyc-Polysorb PF (REFRESH OPTIVE MEGA-3) 0.5-1-0.5 % SOLN Place 1 drop into both eyes daily in the afternoon.     clotrimazole  (MYCELEX ) 10 MG troche Take 1 tablet (10 mg total) by mouth 5 (five) times daily. 50 Troche 0   colchicine  0.6 MG tablet Take 0.6 mg by mouth as needed.     Cyanocobalamin  (B-12 PO) Take 1,000 mcg by mouth daily.     Dexlansoprazole 30 MG capsule DR Take 30 mg by mouth as needed.     diazepam  (VALIUM ) 5 MG tablet Take 5 mg by mouth daily as needed for muscle spasms (cramps).     Diclofenac Sodium (VOLTAREN PO) Take by mouth daily.     ezetimibe  (ZETIA ) 10 MG tablet Take 10 mg by mouth daily.     Finerenone  (KERENDIA ) 10 MG TABS Take 10 mg by mouth daily.     fluticasone  (FLONASE ) 50 MCG/ACT nasal spray Place 2 sprays into both nostrils daily. (Patient taking differently: Place 2 sprays into both nostrils as needed.) 18.2 mL 11   fluticasone -salmeterol (ADVAIR  HFA) 230-21 MCG/ACT inhaler Inhale 2 puffs into the lungs 2 (two) times daily. (Patient taking differently: Inhale 2 puffs into the lungs as needed.) 8 g 6   furosemide  (LASIX ) 40 MG tablet Take 1 tablet (40 mg total) by mouth daily. In the morning (Patient taking differently: Take 40-60 mg by mouth See admin instructions. Take 60 mg in the morning and 20 mg bedtime) 90 tablet 3    hydrocortisone  2.5 % cream Apply topically 2 (two) times daily. Apply as needed two times daily 30 g 0   insulin  glargine (LANTUS  SOLOSTAR) 100 UNIT/ML Solostar Pen Inject 30 Units into the skin as needed.  ipratropium-albuterol  (DUONEB) 0.5-2.5 (3) MG/3ML SOLN Take 3 mLs by nebulization every 4 (four) hours as needed. 360 mL 11   isosorbide  mononitrate (IMDUR ) 30 MG 24 hr tablet Take 1 tablet (30 mg total) by mouth daily. 30 tablet 11   JARDIANCE  10 MG TABS tablet Take 10 mg by mouth daily.     Lancets (ONETOUCH DELICA PLUS LANCET33G) MISC Apply 1 each topically 4 (four) times daily.     levothyroxine  (SYNTHROID ) 150 MCG tablet Take 150 mcg by mouth daily before breakfast.     meclizine  (ANTIVERT ) 25 MG tablet Take 25 mg by mouth as needed.     Mepolizumab  (NUCALA ) 100 MG/ML SOAJ Inject 1 mL (100 mg total) into the skin every 28 (twenty-eight) days. 1 mL 0   metoprolol  succinate (TOPROL  XL) 25 MG 24 hr tablet Take 1 tablet (25 mg total) by mouth daily. (Patient taking differently: Take 12.5 mg by mouth daily.) 90 tablet 3   Multiple Vitamin (MULTIVITAMIN WITH MINERALS) TABS tablet Take 1 tablet by mouth daily.     nitroGLYCERIN  (NITROLINGUAL ) 0.4 MG/SPRAY spray Place 1 spray under the tongue every 5 (five) minutes x 3 doses as needed for chest pain. 12 g 5   NOVOFINE PLUS PEN NEEDLE 32G X 4 MM MISC Inject into the skin.     ONE TOUCH ULTRA TEST test strip Use as directed     oxyCODONE  (OXY IR/ROXICODONE ) 5 MG immediate release tablet Take 1 tablet (5 mg total) by mouth every 4 (four) hours as needed for severe pain. 40 tablet 0   potassium chloride  (KLOR-CON  M) 10 MEQ tablet Take 1 tablet (10 mEq total) by mouth daily. 90 tablet 3   pregabalin  (LYRICA ) 100 MG capsule Take 100 mg by mouth 2 (two) times daily.     ranolazine  (RANEXA ) 500 MG 12 hr tablet Take 1 tablet (500 mg total) by mouth 2 (two) times daily. 180 tablet 3   Respiratory Therapy Supplies (FLUTTER) DEVI 1 puff by Does not apply  route daily. 1 each 0   rosuvastatin  (CRESTOR ) 20 MG tablet TAKE 1 TABLET BY MOUTH DAILY 90 tablet 3   tirzepatide (MOUNJARO) 5 MG/0.5ML Pen Inject 5 mg into the skin once a week.     traMADol  (ULTRAM ) 50 MG tablet Take 50 mg by mouth 3 (three) times daily as needed for pain.     triamterene -hydrochlorothiazide  (MAXZIDE -25) 37.5-25 MG tablet TAKE 1 TABLET BY MOUTH DAILY 90 tablet 3   XIIDRA 5 % SOLN      Pyridoxine  HCl (B-6 PO) Take 1 tablet by mouth daily.     No current facility-administered medications for this visit.     Review of Systems    She denies chest pain, palpitations, dyspnea, pnd, orthopnea, n, v  , syncope, edema, weight gain, or early satiety. All other systems reviewed and are otherwise negative except as noted above.    Physical Exam    VS:  BP 116/62 (BP Location: Left Arm, Patient Position: Sitting, Cuff Size: Normal)   Pulse 82   Ht 5' 2 (1.575 m)   Wt 157 lb 3.2 oz (71.3 kg)   SpO2 94%   BMI 28.75 kg/m   GEN: Well nourished, well developed, in no acute distress. HEENT: normal. Neck: Supple, no JVD, carotid bruits, or masses. Cardiac: RRR, no murmurs, rubs, or gallops. No clubbing, cyanosis, edema.  Radials/DP/PT 2+ and equal bilaterally.  Respiratory:  Respirations regular and unlabored, clear to auscultation bilaterally. GI: Soft, nontender, nondistended, BS +  x 4. MS: no deformity or atrophy. Skin: warm and dry, no rash. Neuro:  Strength and sensation are intact. Psych: Normal affect.  Accessory Clinical Findings    ECG personally reviewed by me today -    - no EKG in office today.    Lab Results  Component Value Date   WBC 6.9 11/15/2021   HGB 11.1 (L) 11/15/2021   HCT 36.2 11/15/2021   MCV 79.7 (L) 11/15/2021   PLT 307 11/15/2021   Lab Results  Component Value Date   CREATININE 1.80 (H) 12/03/2021   BUN 26 12/03/2021   NA 142 12/03/2021   K 4.3 12/03/2021   CL 101 12/03/2021   CO2 26 12/03/2021   Lab Results  Component Value Date    ALT 10 11/15/2021   AST 18 11/15/2021   ALKPHOS 102 11/15/2021   BILITOT 0.7 11/15/2021   Lab Results  Component Value Date   CHOL 186 02/17/2021   HDL 43 02/17/2021   LDLCALC 102 (H) 02/17/2021   TRIG 239 (H) 02/17/2021   CHOLHDL 4.3 02/17/2021    Lab Results  Component Value Date   HGBA1C 6.2 (H) 09/21/2021    Assessment & Plan    1. Hypertension/orthostatic hypotension: BP has improved following multiple medication changes.  She has had some mild intermittent dizziness, lightheadedness associated with low BP readings. She denies  presyncope or syncope. Will decrease metoprolol  to 12.5 mg daily to see if her symptoms continue to improve.   Continue to monitor BP and report SBP consistently less than 100. Continue Lasix , metoprolol , isosorbide , Ranexa  and Maxide.    2. CAD: Most recent cath in 2015 showed 50% p-mLAD stenosis with negative FFR, D1 70%, and patent RCA stent.  Echocardiogram in 07/2023 showed EF 60 to 65%, normal infection, no RWMA, G1 DD, normal RV, mild aortic valve regurgitation. Stable with no anginal symptoms. No indication for ischemic evaluation. Continue aspirin , rosuvastatin , and current medications as above.   3. Hyperlipidemia: LDL was 96 in 04/2023.  Consider repeat labs at follow-up visit.  Continue Crestor .    4. OSA: She is not on on CPAP.  Stable.   5. CKD stage IIIb-IV: Creatinine was 2.3 in 11/2022.  Follows with nephrology.    6. Type 2 diabetes: A1c was 6.1 in 04/2023.  Monitored and managed per PCP.   8. Asthma: Stable. Follows with pulmonology.    9. Disposition: Follow-up in 3 months with Dr. Burnard.      Damien JAYSON Braver, NP 08/18/2023, 12:11 PM

## 2023-08-18 ENCOUNTER — Encounter: Payer: Self-pay | Admitting: Nurse Practitioner

## 2023-08-18 ENCOUNTER — Ambulatory Visit: Payer: Medicare Other | Attending: Nurse Practitioner | Admitting: Nurse Practitioner

## 2023-08-18 VITALS — BP 116/62 | HR 82 | Ht 62.0 in | Wt 157.2 lb

## 2023-08-18 DIAGNOSIS — I951 Orthostatic hypotension: Secondary | ICD-10-CM

## 2023-08-18 DIAGNOSIS — I10 Essential (primary) hypertension: Secondary | ICD-10-CM

## 2023-08-18 DIAGNOSIS — E785 Hyperlipidemia, unspecified: Secondary | ICD-10-CM

## 2023-08-18 DIAGNOSIS — I2510 Atherosclerotic heart disease of native coronary artery without angina pectoris: Secondary | ICD-10-CM | POA: Diagnosis not present

## 2023-08-18 DIAGNOSIS — N184 Chronic kidney disease, stage 4 (severe): Secondary | ICD-10-CM

## 2023-08-18 DIAGNOSIS — E118 Type 2 diabetes mellitus with unspecified complications: Secondary | ICD-10-CM

## 2023-08-18 DIAGNOSIS — G4733 Obstructive sleep apnea (adult) (pediatric): Secondary | ICD-10-CM

## 2023-08-18 DIAGNOSIS — I351 Nonrheumatic aortic (valve) insufficiency: Secondary | ICD-10-CM

## 2023-08-18 DIAGNOSIS — Z794 Long term (current) use of insulin: Secondary | ICD-10-CM

## 2023-08-18 NOTE — Patient Instructions (Signed)
 Medication Instructions:  Decrease Metoprolol  Succinate 12.5 mg daily  *If you need a refill on your cardiac medications before your next appointment, please call your pharmacy*   Lab Work: NONE ordered at this time of appointment    Testing/Procedures: NONE ordered at this time of appointment    Follow-Up: At [facility], you and your health needs are our priority.  As part of our continuing mission to provide you with exceptional heart care, we have created designated Provider Care Teams.  These Care Teams include your primary Cardiologist (physician) and Advanced Practice Providers (APPs -  Physician Assistants and Nurse Practitioners) who all work together to provide you with the care you need, when you need it.  We recommend signing up for the patient portal called MyChart.  Sign up information is provided on this After Visit Summary.  MyChart is used to connect with patients for Virtual Visits (Telemedicine).  Patients are able to view lab/test results, encounter notes, upcoming appointments, etc.  Non-urgent messages can be sent to your provider as well.   To learn more about what you can do with MyChart, go to [email or web address].    Your next appointment:   3 month(s)  Provider:   Debby Sor, MD

## 2023-08-21 ENCOUNTER — Other Ambulatory Visit (HOSPITAL_COMMUNITY): Payer: Self-pay

## 2023-08-21 NOTE — Telephone Encounter (Signed)
 Submitted an URGENT appeal to [facility] for NUCALA.  Reference # [id number] Fax: [phone]

## 2023-08-21 NOTE — Telephone Encounter (Signed)
 Received a fax regarding Prior Authorization from [facility] for NUCALA. Authorization has been DENIED because pt has not had previous trial and failure of FASENRA.  Request Reference Number: [id number] Phone#[phone]

## 2023-08-22 ENCOUNTER — Other Ambulatory Visit (HOSPITAL_COMMUNITY): Payer: Self-pay

## 2023-08-22 NOTE — Telephone Encounter (Signed)
 I hope it gets approved

## 2023-08-23 ENCOUNTER — Other Ambulatory Visit (HOSPITAL_COMMUNITY): Payer: Self-pay

## 2023-08-24 ENCOUNTER — Other Ambulatory Visit (HOSPITAL_COMMUNITY): Payer: Self-pay

## 2023-08-28 NOTE — Telephone Encounter (Signed)
Received fax from [facility] requesting clarification on appeal. Form completed and faxed  Case: [id number] Fax: [phone]  Chesley Mires, PharmD, MPH, BCPS, CPP Clinical Pharmacist (Rheumatology and Pulmonology)

## 2023-09-01 ENCOUNTER — Other Ambulatory Visit (HOSPITAL_COMMUNITY): Payer: Self-pay

## 2023-09-01 NOTE — Telephone Encounter (Signed)
Attempted to run test claim. Refill too soon until 09/14/2023. No rejection for PA required though may not populated at this time.  Can try to re-run claim on this date if notification of appeal determination has not been reached.  Chesley Mires, PharmD, MPH, BCPS, CPP Clinical Pharmacist (Rheumatology and Pulmonology)

## 2023-09-05 ENCOUNTER — Ambulatory Visit (INDEPENDENT_AMBULATORY_CARE_PROVIDER_SITE_OTHER): Payer: Medicare Other | Admitting: Pulmonary Disease

## 2023-09-05 ENCOUNTER — Encounter: Payer: Self-pay | Admitting: Pulmonary Disease

## 2023-09-05 VITALS — BP 106/58 | HR 71 | Temp 97.8°F | Ht 62.0 in | Wt 157.2 lb

## 2023-09-05 DIAGNOSIS — J019 Acute sinusitis, unspecified: Secondary | ICD-10-CM | POA: Diagnosis not present

## 2023-09-05 DIAGNOSIS — J8283 Eosinophilic asthma: Secondary | ICD-10-CM

## 2023-09-05 DIAGNOSIS — R0602 Shortness of breath: Secondary | ICD-10-CM | POA: Diagnosis not present

## 2023-09-05 MED ORDER — AMOXICILLIN-POT CLAVULANATE 875-125 MG PO TABS: 1.0000 | ORAL_TABLET | Freq: Two times a day (BID) | ORAL | 0 refills | Status: AC

## 2023-09-05 MED ORDER — FLUTICASONE-SALMETEROL 230-21 MCG/ACT IN AERO: 2.0000 | INHALATION_SPRAY | Freq: Two times a day (BID) | RESPIRATORY_TRACT | 6 refills | Status: DC

## 2023-09-05 MED ORDER — PREDNISONE 20 MG PO TABS: 20.0000 mg | ORAL_TABLET | Freq: Every day | ORAL | 0 refills | Status: DC

## 2023-09-05 NOTE — Patient Instructions (Signed)
I will see you back in about 6 months  Refill for your Advair sent in  Sent in a prescription for Augmentin and prednisone  Continue with your Nucala  Call us with any significant concerns

## 2023-09-05 NOTE — Progress Notes (Signed)
Synopsis: Referred in January 2021 for SOB by Adrian Prince, MD.   Subjective:   PATIENT ID: [name] GENDER: female DOB: [birth year], MRN: [id number]  Patient with a history of severe persistent asthma Has been on Nucala for about 18 months  Was able to resume Nucala  Uses Advair HFA  Currently does have sinus fullness congestion, some chills, no fevers Secretions are yellowish  Past history of Stevens-Johnson syndrome hospitalized for few days Denies any cough or significant shortness of breath with activity  Has been staying active going to the gym regularly   She has a history of allergic rhinosinusitis, sinus drainage Flonase, Singulair  Not feeling acutely ill at present  Past Medical History:  Diagnosis Date   Acute bronchitis    Anemia    hx of anemia   Anginal pain (HCC)    Arthritis    osteo   Asthma    Chronic kidney disease (CKD), stage IV (severe) (HCC)    Coronary atherosclerosis    Cough    GERD (gastroesophageal reflux disease)    History of colon polyps    History of migraine    Hyperlipidemia    Hypertension    Obstructive sleep apnea (adult) (pediatric)    NO CPAP    Peripheral neuropathy    Type II or unspecified type diabetes mellitus without mention of complication, not stated as uncontrolled    type 2   Unspecified hypothyroidism      Family History  Problem Relation Age of Onset   Hypertension Mother    Heart disease Mother    Stroke Mother    Prostate cancer Father    Heart disease Brother    Heart attack Sister      Past Surgical History:  Procedure Laterality Date   ANTERIOR FUSION CERVICAL SPINE  2009   CARPAL TUNNEL RELEASE Left    COLONOSCOPY     CORONARY STENT INTERVENTION  2007   LEFT HEART CATHETERIZATION WITH CORONARY ANGIOGRAM N/A 05/29/2014   Procedure: LEFT HEART CATHETERIZATION WITH CORONARY ANGIOGRAM;  Surgeon: Lesleigh Noe, MD;  Location: [facility] CATH LAB;  Service: Cardiovascular;  Laterality:  N/A;   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   Procedure: TOTAL KNEE ARTHROPLASTY;  Surgeon: Jene Every, MD;  Location: [facility] ORS;  Service: Orthopedics;  Laterality: Left;  3 hrs   TOTAL KNEE ARTHROPLASTY Right 10/01/2021   Procedure: TOTAL KNEE ARTHROPLASTY;  Surgeon: Jene Every, MD;  Location: [facility];  Service: Orthopedics;  Laterality: Right;   TUBAL LIGATION  1990   WISDOM TOOTH EXTRACTION      Social History   Socioeconomic History   Marital status: Widowed    Spouse name: Not on file   Number of children: 0   Years of education: Not on file   Highest education level: Not on file  Occupational History   Occupation: retired  Tobacco Use   Smoking status: Former    Current packs/day: 0.00    Average packs/day: 0.5 packs/day for 25.0 years (12.5 ttl pk-yrs)    Types: Cigarettes    Start date: 08/08/1978    Quit date: 08/09/2003    Years since quitting: 20.0   Smokeless tobacco: Never  Vaping Use   Vaping status: Never Used  Substance and Sexual Activity   Alcohol use: No    Alcohol/week: 0.0 standard drinks of alcohol   Drug use: No   Sexual activity: Not on file  Other Topics Concern   Not on file  Social History Narrative   Not on file   Social Drivers of Health   Financial Resource Strain: Not on file  Food Insecurity: Not on file  Transportation Needs: Not on file  Physical Activity: Not on file  Stress: Not on file  Social Connections: Not on file  Intimate Partner Violence: Not on file     Allergies  Allergen Reactions   Allopurinol Rash    Toxic epidermal necrolysis    Codeine Other (See Comments)    REACTION: hallucinations, funny in the head    Levaquin [Levofloxacin] Nausea And Vomiting   Sulfa Antibiotics Hives   Sulfonamide Derivatives Hives     Immunization History  Administered Date(s) Administered   Fluad Quad(high Dose 65+) 06/05/2019   Influenza Split 05/08/2010, 05/10/2011, 05/09/2012, 05/31/2013, 05/26/2020   Influenza Whole  05/08/2010, 05/26/2020   Influenza,inj,Quad PF,6+ Mos 09/14/2015, 04/19/2016, 04/24/2017   Influenza,inj,quad, With Preservative 05/23/2019   Influenza-Unspecified 04/22/2010, 05/09/2012, 08/08/2012, 06/04/2014, 05/14/2020   PFIZER Comirnaty(Gray Top)Covid-19 Tri-Sucrose Vaccine 06/28/2020, 01/31/2021   PFIZER(Purple Top)SARS-COV-2 Vaccination 10/03/2019, 10/25/2019   Pneumococcal Polysaccharide-23 08/09/2007, 07/08/2010   Td 08/08/2008   Td (Adult),5 Lf Tetanus Toxid, Preservative Free 08/08/2008    Outpatient Medications Prior to Visit  Medication Sig Dispense Refill   albuterol (PROVENTIL HFA) 108 (90 Base) MCG/ACT inhaler Inhale 2 puffs into the lungs every 6 (six) hours as needed for wheezing or shortness of breath. For shortness of breath and wheezing 18 each 2   Ascorbic Acid (VITAMIN C PO) Take 1 tablet by mouth daily.     BIOTIN PO Take 500 mg by mouth daily.     Carboxymeth-Glyc-Polysorb PF (REFRESH OPTIVE MEGA-3) 0.5-1-0.5 % SOLN Place 1 drop into both eyes daily in the afternoon.     clotrimazole (MYCELEX) 10 MG troche Take 1 tablet (10 mg total) by mouth 5 (five) times daily. 50 Troche 0   colchicine 0.6 MG tablet Take 0.6 mg by mouth as needed.     Cyanocobalamin (B-12 PO) Take 1,000 mcg by mouth daily.     Dexlansoprazole 30 MG capsule DR Take 30 mg by mouth as needed.     diazepam (VALIUM) 5 MG tablet Take 5 mg by mouth daily as needed for muscle spasms (cramps).     Diclofenac Sodium (VOLTAREN PO) Take by mouth daily.     ezetimibe (ZETIA) 10 MG tablet Take 10 mg by mouth daily.     Finerenone (KERENDIA) 10 MG TABS Take 10 mg by mouth daily.     fluticasone (FLONASE) 50 MCG/ACT nasal spray Place 2 sprays into both nostrils daily. (Patient taking differently: Place 2 sprays into both nostrils as needed.) 18.2 mL 11   fluticasone-salmeterol (ADVAIR HFA) 230-21 MCG/ACT inhaler Inhale 2 puffs into the lungs 2 (two) times daily. (Patient taking differently: Inhale 2 puffs into  the lungs as needed.) 8 g 6   furosemide (LASIX) 40 MG tablet Take 1 tablet (40 mg total) by mouth daily. In the morning (Patient taking differently: Take 40-60 mg by mouth See admin instructions. Take 60 mg in the morning and 20 mg bedtime) 90 tablet 3   hydrocortisone 2.5 % cream Apply topically 2 (two) times daily. Apply as needed two times daily 30 g 0   ipratropium-albuterol (DUONEB) 0.5-2.5 (3) MG/3ML SOLN Take 3 mLs by nebulization every 4 (four) hours as needed. 360 mL 11   isosorbide mononitrate (IMDUR) 30 MG 24 hr tablet Take 1 tablet (30 mg total) by mouth daily. 30 tablet 11  JARDIANCE 10 MG TABS tablet Take 10 mg by mouth daily.     Lancets (ONETOUCH DELICA PLUS LANCET33G) MISC Apply 1 each topically 4 (four) times daily.     levothyroxine (SYNTHROID) 150 MCG tablet Take 150 mcg by mouth daily before breakfast.     meclizine (ANTIVERT) 25 MG tablet Take 25 mg by mouth as needed.     Mepolizumab (NUCALA) 100 MG/ML SOAJ Inject 1 mL (100 mg total) into the skin every 28 (twenty-eight) days. 1 mL 0   metoprolol succinate (TOPROL XL) 25 MG 24 hr tablet Take 1 tablet (25 mg total) by mouth daily. (Patient taking differently: Take 12.5 mg by mouth daily.) 90 tablet 3   Multiple Vitamin (MULTIVITAMIN WITH MINERALS) TABS tablet Take 1 tablet by mouth daily.     nitroGLYCERIN (NITROLINGUAL) 0.4 MG/SPRAY spray Place 1 spray under the tongue every 5 (five) minutes x 3 doses as needed for chest pain. 12 g 5   NOVOFINE PLUS PEN NEEDLE 32G X 4 MM MISC Inject into the skin.     ONE TOUCH ULTRA TEST test strip Use as directed     potassium chloride (KLOR-CON M) 10 MEQ tablet Take 1 tablet (10 mEq total) by mouth daily. 90 tablet 3   pregabalin (LYRICA) 100 MG capsule Take 100 mg by mouth 2 (two) times daily.     Pyridoxine HCl (B-6 PO) Take 1 tablet by mouth daily.     ranolazine (RANEXA) 500 MG 12 hr tablet Take 1 tablet (500 mg total) by mouth 2 (two) times daily. 180 tablet 3   Respiratory Therapy  Supplies (FLUTTER) DEVI 1 puff by Does not apply route daily. 1 each 0   rosuvastatin (CRESTOR) 20 MG tablet TAKE 1 TABLET BY MOUTH DAILY 90 tablet 3   tirzepatide (MOUNJARO) 5 MG/0.5ML Pen Inject 5 mg into the skin once a week.     triamterene-hydrochlorothiazide (MAXZIDE-25) 37.5-25 MG tablet TAKE 1 TABLET BY MOUTH DAILY 90 tablet 3   XIIDRA 5 % SOLN      insulin glargine (LANTUS SOLOSTAR) 100 UNIT/ML Solostar Pen Inject 30 Units into the skin as needed. (Patient not taking: Reported on 09/05/2023)     oxyCODONE (OXY IR/ROXICODONE) 5 MG immediate release tablet Take 1 tablet (5 mg total) by mouth every 4 (four) hours as needed for severe pain. (Patient not taking: Reported on 09/05/2023) 40 tablet 0   traMADol (ULTRAM) 50 MG tablet Take 50 mg by mouth 3 (three) times daily as needed for pain. (Patient not taking: Reported on 09/05/2023)     No facility-administered medications prior to visit.    Review of Systems  Constitutional:  Negative for chills, fever and weight loss.  HENT:  Negative for congestion.        Postnasal drip  Respiratory:  Positive for cough, shortness of breath and wheezing.   Cardiovascular:  Negative for chest pain and leg swelling.  Gastrointestinal:  Negative for heartburn.  Endo/Heme/Allergies:  Positive for environmental allergies.     Objective:   Vitals:   09/05/23 1046  BP: (!) 106/58  Pulse: 71  Temp: 97.8 F (36.6 C)  TempSrc: Oral  SpO2: 95%  Weight: 157 lb 3.2 oz (71.3 kg)  Height: 5\' 2"  (1.575 m)   95% on   RA BMI Readings from Last 3 Encounters:  09/05/23 28.75 kg/m  08/18/23 28.75 kg/m  05/17/23 29.37 kg/m   Wt Readings from Last 3 Encounters:  09/05/23 157 lb 3.2 oz (71.3 kg)  08/18/23 157 lb 3.2 oz (71.3 kg)  05/17/23 160 lb 9.6 oz (72.8 kg)    Physical Exam Vitals reviewed.  Constitutional:      Appearance: Normal appearance. She is not ill-appearing.  HENT:     Head: Normocephalic and atraumatic.     Mouth/Throat:      Mouth: Mucous membranes are moist.  Eyes:     General: No scleral icterus. Cardiovascular:     Rate and Rhythm: Normal rate and regular rhythm.     Heart sounds: No murmur heard.    No friction rub.  Pulmonary:     Effort: No respiratory distress.     Breath sounds: No stridor. No wheezing or rhonchi.  Musculoskeletal:     Cervical back: No rigidity or tenderness.  Lymphadenopathy:     Cervical: No cervical adenopathy.  Skin:    General: Skin is warm.  Neurological:     Mental Status: She is alert.     Motor: No weakness.     Coordination: Coordination normal.  Psychiatric:        Mood and Affect: Mood normal.        Behavior: Behavior normal.    CBC    Component Value Date/Time   WBC 6.9 11/15/2021 1539   RBC 4.54 11/15/2021 1539   HGB 11.1 (L) 11/15/2021 1539   HGB 10.9 (L) 03/28/2018 1031   HCT 36.2 11/15/2021 1539   HCT 35.4 03/28/2018 1031   PLT 307 11/15/2021 1539   PLT 312 03/28/2018 1031   MCV 79.7 (L) 11/15/2021 1539   MCV 77 (L) 03/28/2018 1031   MCH 24.4 (L) 11/15/2021 1539   MCHC 30.7 11/15/2021 1539   RDW 15.8 (H) 11/15/2021 1539   RDW 17.0 (H) 03/28/2018 1031   LYMPHSABS 1.6 11/15/2021 1539   MONOABS 0.7 11/15/2021 1539   EOSABS 0.1 11/15/2021 1539   BASOSABS 0.0 11/15/2021 1539    09/05/2019 labs: IgE 419 Eosinophils 800  02/17/2021-Chem-7 within normal limits-BUN 29, creatinine 1.9, 02/16/2021 CBC within normal limits  Chest Imaging- films reviewed: CXR, 2 view 12/08/2015- C-spine hardware, otherwise normal No recent chest x-ray available  Pulmonary Functions Testing Results:    Latest Ref Rng & Units 12/22/2015    1:49 PM  PFT Results  FVC-Pre L 2.60   FVC-Predicted Pre % 106   FVC-Post L 2.48   FVC-Predicted Post % 102   Pre FEV1/FVC % % 72   Post FEV1/FCV % % 67   FEV1-Pre L 1.87   FEV1-Predicted Pre % 97   FEV1-Post L 1.67   DLCO uncorrected ml/min/mmHg 13.06   DLCO UNC% % 60   DLVA Predicted % 66   TLC L 4.76   TLC % Predicted  % 100   RV % Predicted % 118    2017- FVC 3.0 (97%) 3.18 (102%, +6%)  FEV1  2.05 (87%) 2.14 (91%, +5%) Ratio 68 TLC 106% RV 119% DLCO 62%    Echocardiogram 04/23/2019: LVEF 60 to 65%, mild LVH.  Normal diastolic function.  Normal LA, RV, RA.  Mild AI, otherwise normal valves.     Assessment & Plan:   Severe persistent asthma with elevated IgE levels and eosinophils -Back on Nucala  Sinus infection at present -Will call in a course of Augmentin and prednisone  Continue using Advair HFA on a regular basis  Chronic rhinosinusitis -Continue nasal steroids  History of GERD -Continue antireflux medications  History of Trudie Buckler syndrome -Breathing remains stable -Encouraged to call with any  significant concerns with regards to her breathing   Continue Nucala Graded exercise as tolerated  Rescue inhaler use as needed  Follow-up in 6 months

## 2023-09-07 ENCOUNTER — Telehealth: Payer: Self-pay

## 2023-09-07 ENCOUNTER — Other Ambulatory Visit (HOSPITAL_COMMUNITY): Payer: Self-pay

## 2023-09-07 NOTE — Telephone Encounter (Signed)
*  Pulm  Pharmacy Patient Advocate Encounter   Received notification from CoverMyMeds that prior authorization for Advair HFA 230-21MCG/ACT aerosol  is required/requested.   Insurance verification completed.   The patient is insured through [facility] .   Per test claim: PA required; PA submitted to above mentioned insurance via CoverMyMeds Key/confirmation #/EOC [id number] Status is pending

## 2023-09-08 NOTE — Telephone Encounter (Signed)
Dr. Wynona Neat please advise. I do not see in the patient's chart where she has ever been prescribed Symbicort. Her insurance prefers Symbicort.

## 2023-09-08 NOTE — Telephone Encounter (Addendum)
Prior Authorization form/request asks a question that requires your assistance. Please see the question below and advise accordingly. The PA will not be submitted until the necessary information is received.   PA form attached in patients media.  Has the patient had a failure, contraindication or intolerance to the preferred alternative of Brand Symbicort?

## 2023-09-12 ENCOUNTER — Other Ambulatory Visit: Payer: Self-pay

## 2023-09-15 ENCOUNTER — Encounter: Payer: Self-pay | Admitting: Pulmonary Disease

## 2023-09-15 ENCOUNTER — Other Ambulatory Visit (HOSPITAL_COMMUNITY): Payer: Self-pay

## 2023-09-15 ENCOUNTER — Other Ambulatory Visit: Payer: Self-pay | Admitting: Pulmonary Disease

## 2023-09-15 MED ORDER — BUDESONIDE-FORMOTEROL FUMARATE 160-4.5 MCG/ACT IN AERO: 2.0000 | INHALATION_SPRAY | Freq: Two times a day (BID) | RESPIRATORY_TRACT | 6 refills | Status: AC

## 2023-09-15 NOTE — Telephone Encounter (Signed)
 Ran test claim - copay for Nucala  successfully processed for $0 through [employer] as primary and [employer] as secondary.  Auth through [employer] appears to be active through 08/07/2024  Notified [facility]  Sherry Pennant, PharmD, MPH, BCPS, CPP Clinical Pharmacist (Rheumatology and Pulmonology)

## 2023-09-18 ENCOUNTER — Other Ambulatory Visit: Payer: Self-pay

## 2023-09-18 ENCOUNTER — Other Ambulatory Visit: Payer: Self-pay | Admitting: Pulmonary Disease

## 2023-09-18 ENCOUNTER — Other Ambulatory Visit (HOSPITAL_COMMUNITY): Payer: Self-pay

## 2023-09-18 DIAGNOSIS — J4551 Severe persistent asthma with (acute) exacerbation: Secondary | ICD-10-CM

## 2023-09-18 MED ORDER — NUCALA 100 MG/ML ~~LOC~~ SOAJ
100.0000 mg | SUBCUTANEOUS | 0 refills | Status: DC
  Filled 2023-09-18: qty 1, 28d supply, fill #0

## 2023-09-18 NOTE — Progress Notes (Signed)
 Specialty Pharmacy Refill Coordination Note  [name] is a 68 y.o. female contacted today regarding refills of specialty medication(s) Mepolizumab  (Nucala )   Patient requested Delivery   Delivery date: 09/21/23   Verified address: [address]   [place] [address]   Medication will be filled on 02.12.25.   This fill date is pending response to refill request from provider. Patient is aware and if they have not received fill by intended date they must follow up with pharmacy.

## 2023-09-20 ENCOUNTER — Other Ambulatory Visit: Payer: Self-pay

## 2023-10-11 ENCOUNTER — Other Ambulatory Visit (HOSPITAL_COMMUNITY): Payer: Self-pay

## 2023-10-12 ENCOUNTER — Other Ambulatory Visit: Payer: Self-pay

## 2023-10-12 ENCOUNTER — Other Ambulatory Visit: Payer: Self-pay | Admitting: Pulmonary Disease

## 2023-10-12 DIAGNOSIS — J4551 Severe persistent asthma with (acute) exacerbation: Secondary | ICD-10-CM

## 2023-10-12 NOTE — Progress Notes (Signed)
 Specialty Pharmacy Refill Coordination Note  [name] is a 68 y.o. female contacted today regarding refills of specialty medication(s) Mepolizumab [name])   Patient requested (Patient-Rptd) Delivery   Delivery date: (Patient-Rptd) 10/25/23   Verified address: (Patient-Rptd) [address]  [place] [address]   Medication will be filled on 03.18.25.     This fill date is pending response to refill request from provider. Patient is aware and if they have not received fill by intended date they must follow up with pharmacy.

## 2023-10-13 ENCOUNTER — Other Ambulatory Visit: Payer: Self-pay

## 2023-10-13 MED ORDER — NUCALA 100 MG/ML ~~LOC~~ SOAJ: 100.0000 mg | SUBCUTANEOUS | 0 refills | Status: DC

## 2023-10-13 MED ORDER — NUCALA 100 MG/ML ~~LOC~~ SOAJ
100.0000 mg | SUBCUTANEOUS | 5 refills | Status: DC
  Filled 2023-10-13: qty 1, 28d supply, fill #0
  Filled 2023-11-10 (×2): qty 1, 28d supply, fill #1
  Filled 2023-12-12: qty 1, 28d supply, fill #2
  Filled 2024-01-10: qty 1, 28d supply, fill #3
  Filled 2024-02-08: qty 1, 28d supply, fill #4
  Filled 2024-03-13: qty 1, 28d supply, fill #5

## 2023-10-13 NOTE — Addendum Note (Signed)
 Addended by: Murrell Redden on: 10/13/2023 01:34 PM   Modules accepted: Orders

## 2023-11-09 ENCOUNTER — Telehealth: Payer: Self-pay | Admitting: *Deleted

## 2023-11-09 NOTE — Telephone Encounter (Signed)
   Pre-operative Risk Assessment    Patient [name]  DOB: 06/05/1956 MRN: [id number]   Date of last office visit: 08/18/2023 Date of next office visit: 11/29/2023 Pre op added to pts upcoming appt notes.   Request for Surgical Clearance    Procedure:   Right Total Hip Arthroplasty  Date of Surgery:  Clearance 01/02/24                                 Surgeon:  Dr. Durene Romans Surgeon's Group or Practice Name:  [facility] Phone number:  [phone] Fax number:  [phone]   Type of Clearance Requested:   - Medical    Type of Anesthesia:  Spinal   Additional requests/questions:    Signed, Emmit Pomfret   11/09/2023, 12:35 PM

## 2023-11-09 NOTE — Telephone Encounter (Signed)
 Patient has a scheduled appointment for pre-op clearance with Dr. Tresa Endo on 11/29/2023. Procedure date is 01/02/24, per pre-op APP, ok to defer to MD appointment for clearance.

## 2023-11-09 NOTE — Telephone Encounter (Signed)
   [name]  DOB: 01-Oct-1955  MRN: [id number]  Primary Cardiologist: Nicki Guadalajara, MD   Preoperative team, please contact this patient and set up a phone call appointment for further preoperative risk assessment. Please obtain consent and complete medication review. Thank you for your help.  I confirm that guidance regarding antiplatelet and oral anticoagulation therapy has been completed and, if necessary, noted below.  ASA not listed in med list, but would need to be held before spinal procedure.   I also confirmed the patient resides in the state of [place]. As per [facility] Medical Board telemedicine laws, the patient must reside in the state in which the provider is licensed.   Roe Rutherford Yaire Kreher, PA 11/09/2023, 1:30 PM [facility]

## 2023-11-10 ENCOUNTER — Other Ambulatory Visit: Payer: Self-pay

## 2023-11-10 ENCOUNTER — Other Ambulatory Visit (HOSPITAL_COMMUNITY): Payer: Self-pay

## 2023-11-10 NOTE — Telephone Encounter (Signed)
 ok

## 2023-11-10 NOTE — Progress Notes (Signed)
 Specialty Pharmacy Refill Coordination Note  [name] is a 68 y.o. female contacted today regarding refills of specialty medication(s) Nucala.  Patient requested (Patient-Rptd) Delivery   Delivery date: (Patient-Rptd) 11/25/23   Verified address: (Patient-Rptd) [address], [place]   Medication will be filled on 11/23/23. New delivery date is 11/24/23. Patient has been notified.

## 2023-11-23 ENCOUNTER — Other Ambulatory Visit: Payer: Self-pay

## 2023-11-29 ENCOUNTER — Encounter: Payer: Self-pay | Admitting: Cardiovascular Disease

## 2023-11-29 ENCOUNTER — Ambulatory Visit: Payer: Medicare Other | Attending: Cardiovascular Disease | Admitting: Cardiovascular Disease

## 2023-11-29 VITALS — BP 108/60 | HR 69 | Ht 62.0 in | Wt 160.8 lb

## 2023-11-29 DIAGNOSIS — E118 Type 2 diabetes mellitus with unspecified complications: Secondary | ICD-10-CM | POA: Diagnosis not present

## 2023-11-29 DIAGNOSIS — N184 Chronic kidney disease, stage 4 (severe): Secondary | ICD-10-CM | POA: Diagnosis not present

## 2023-11-29 DIAGNOSIS — I2510 Atherosclerotic heart disease of native coronary artery without angina pectoris: Secondary | ICD-10-CM

## 2023-11-29 DIAGNOSIS — E785 Hyperlipidemia, unspecified: Secondary | ICD-10-CM

## 2023-11-29 DIAGNOSIS — I10 Essential (primary) hypertension: Secondary | ICD-10-CM

## 2023-11-29 DIAGNOSIS — Z794 Long term (current) use of insulin: Secondary | ICD-10-CM

## 2023-11-29 DIAGNOSIS — E039 Hypothyroidism, unspecified: Secondary | ICD-10-CM

## 2023-11-29 NOTE — Progress Notes (Unsigned)
 Patient ID: [name], female   DOB: Dec 13, 1955, 68 y.o.   MRN: [id number]       PCP: Dr. Jesse Moritz  HPI: [name] is a 68 y.o. female who presents to the office for a 6 month cardiology evaluation.   [name] has known coronary artery disease and in 2007 underwent insertion of a 3.0x13 mm Cypher DES stent to her proximal RCA. She did have concomitant CAD with 60-70% stenosis of her LAD and 20% circumflex stenosis. In July 2010 she develop recurrent chest pain and repeat catheterization showed 60% LAD stenosis, 20-30% circumflex stenosis and a widely patent RCA stent.  She has a history of hypertension, hyperlipidemia, hypothyroidism, diabetes mellitus, asthma, GERD, as well as intermittent leg swelling.  When I saw her in December, 2014, she had experienced some episodes of chest pain, which sounded musculoskeletal.  However, with her cardiac risk [name] profile, and known CAD .  I recommended a nuclear stress test, but she did not have that done.  I last saw her in September 2015 and ultimately recommended that she undergo the nuclear stress study.  This was interpreted as a high risk study revealing a large moderate intensity reversible distal anterior apical and inferior defect.  As result, she underwent cardiac catheterization in October 2015.  Her catheterization findings were essentially unchanged from previously and showed a widely patent RCA stent.  There was segmental 50% proximal to mid LAD stenoses with an FFR of 0.94.  There were irregularities in the circumflex.  She had normal LV function.  She sees Dr. Jesse Moritz and had laboratory in January 2017.  This was notable in that her cholesterol was 191, triglycerides 267, HDL 49, and LDL 89.  Gross was elevated at 190.  Her TSH was 0.11 and free T4 1 0.5.  Subsequent blood work in May 2017 showed a BUN of 24, creatinine 1.19.  Hemoglobin 10.9, hematocrit 34.0.  On 01/06/2016 she underwent an echo Doppler study which showed an  ejection fraction of 60-65% with grade 1 diastolic dysfunction.  There was trivial AR.  When I last saw her, she denied  any episodes of exertional chest tightness, but had rare episodes of chest wall discomfort which improves with aspirin .  She had occasional swelling of her ankles.  She also has a history of intermittent wheezing for which he takes Advair .  Laboratory by Dr. Jesse Moritz had revealed significant triglyceride elevation and I recommended lovasa at 2 capsules twice a day.    When I saw her in May 2018 she had experienced occasional episodes of wheezing in the winter months.  She has sleep apnea and is on CPAP.  She denied any episodes of chest pressure.  She was unaware of palpitations.  She went to the emergency room on 11/28/2016 with shortness of breath.  She was given a breathing treatment in the emergency room with moderate relief of her symptoms.  She was felt to have mild asthma with exacerbation.  Laboratory  showed stable renal function with a creatinine 1.13.  She was anemic and is being followed by Dr. Jesse Moritz with her hemoglobin of 9.3, hematocrit of 29.8.  BNP was normal at 59.9.   After not seeing her for 25 months, I  saw her on February 04, 2019.  She was without recurrent anginal symptomatology, however she had noticed increasing episodes of shortness of breath over the previous 6 to 7 months.  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  Seen Dr. Jesse Moritz and her hemoglobin A1c was 6.6.  During that evaluation I recommended slight additional titration of her metoprolol  succinate to 37.5 mg  which would be helpful both for more optimal blood pressure control as well as improvement in her baseline resting heart rate which was in the upper 80s.  She underwent a follow-up echo Doppler study on April 23, 2019 which showed normal systolic and diastolic parameters.  Ejection fraction was 60 to 65%.  There was mild left ventricular hypertrophy and mild aortic insufficiency.  I  saw her on May 06, 2019.  At that time, she complained of being fatigued and had noticed some mild lightheadedness and some exertional shortness of breath.  She was unaware of any wheezing and denied fever chills or night sweats.  She denied palpitations.  At that time, her blood pressure was low and I recommended she decrease isosorbide  from 120 mg down to 60 mg and reduce Verapamil  SR from 240 mg down to 120 mg.  I recommended she continue Toprol -XL 37.5 mg but to stagger this and take 12.5 mg in the morning and 25 mg at night.  She has been on levothyroxine  200 mcg for hypothyroidism followed by Dr. Jesse Moritz and also was on Brio Ellipta for intermittent asthma.  She was on insulin  and Victoza  for her diabetes.  She was  seen by me in December 2020.  At that time her blood pressure had improved as has her lightheadedness.  An echo Doppler study in September 2020 showed an EF at 60 to 65% with mild LVH.  There was mild aortic insufficiency.  She has continued to use CPAP therapy.  She has had advance home care for her DME company.  Remotely she had been with choice and would like to be switched back to them.    She was evaluated by Tacy Expose, PA-C on November 04, 2020.  Previous to that evaluation she was evaluated by Ervin Heath, PA in January 2022 with complaints of some mild chest discomfort.  She was started on Ranexa  500 mg twice a day.  When seen by Rosalia Colonel, her chest pain had improved.  She has a history of Rogue Clear syndrome.  I saw her on January 20, 2021.  At that time she had noted some mild lightheadedness.  She had been taking  furosemide  and has been taking 40 mg in the morning and 20 mg later in the day, Jardiance  10 mg daily, isosorbide  120 mg, metoprolol  succinate 37.5 mg twice a day dosing 500 mg twice daily, Maxide 37/25 mg daily and verapamil  120 mg mg daily.  She denied any chest tightness.  She has been followed by Dr. Jesse Moritz who has been checking her laboratory.  She continues to be  on Jardiance  and insulin  for her diabetes and levothyroxine  for hypothyroidism.  During that evaluation, she was orthostatic with her blood pressure 100/60 supine dropping to 78/60 while standing.  I recommended that since she was taking furosemide  40 mg in the morning and 20 mg in the afternoon that she discontinue her Maxide.  She also had been on metoprolol  succinate 37.5 mg twice a day and I recommended she reduce her evening dose down to 25 mg.  Dr. Jesse Moritz has been checking her laboratory.  Her creatinine was elevated at 1.9 but improved from 2.18.  She underwent subsequent lipid studies and triglycerides were 239, VLDL 41, and LDL at 102.  At the time she was on rosuvastatin  5 mg.  She was contacted  to increase rosuvastatin  and also add Lovaza  1 capsule twice a day.  I saw her in March 19, 2021.  She tells me approximately 5 days after stopping her Maxide she began to notice more lower extremity edema.  She was trying to avoid sodium intake.  Ultimately she resumed taking her Maxide.  She saw Dr. Jesse Moritz last week for laboratory and has a follow-up office visit later today.  She denied any chest pain PND orthopnea.  Her lightheadedness has improved.  I  saw her in August 10, 2021.  At that time she felt well.   She stopped using CPAP several years ago and admits that she is sleeping well.  There is no snoring.  Her sleep is restorative.  She is now on furosemide  40 mg daily, Maxide 37.5/25 mg for swelling, isosorbide  60 mg, metoprolol  succinate 37.5 mg, verapamil  120 mg and ranolazine  500 mg twice a day.  She is not having any anginal symptomatology.  She is on Jardiance , insulin , Mounjaro injection weekly, for diabetes mellitus, she is on Gable Johann and is followed by nephrology.  She is on albuterol  as needed, Singulair , and Nucala  injection monthly for her asthma.  She is on rosuvastatin  20 mg and lovaza  a 1 g twice a day for hyperlipidemia.    Since I last saw her, she was evaluated by Marlana Silvan, NP  for cardiology evaluation on February 06, 2023.  She had lost significant weight.  And at that time was on Lasix , metoprolol , isosorbide , Ranexa  and Maxide.  She was being followed by nephrology with creatinine of 2.3 in April 2024.  I last saw her on May 17, 2023.  She has lost over 40 pounds since initiating Mounjaro.  She denies chest pain.  She has symptoms occasionally of indigestion.  She no longer uses CPAP and has not used this for at least 4 years.  She is no longer on verapamil .  She has been taking Lasix  40 in the morning and 20 at bedtime.  Her blood pressure has been on the low side.  She sees Dr. Rosslyn Coons at [facility] for primary care.  Recent laboratory from April 25, 2023 showed total cholesterol 192, HDL 48, LDL 96 and triglycerides were elevated at 241.  Creatinine was 2.3 in April 2024.  Hemoglobin A1c was 6.1 in September 2024.  During that evaluation, her blood pressure was low and I recommended she decrease Lasix  from 40 mg in the morning and 20 mg at night to only take the morning dose.  And I also change her metoprolol  succinate to 25 mg alternating with 12.5 mg every other day.  She continued to be on ranolazine  and was not having anginal symptoms and remained on rosuvastatin  20 mg.  I discussed possible addition of Zetia if LDL remain greater than 70.  She underwent an echo Doppler study on July 24, 2023 which showed EF of 60 to 65%.  There was mild grade 1 diastolic dysfunction.  There was mild aortic regurgitation.  She was seen by Marlana Silvan, NP on August 18, 2023 and remained stable although at times had transient lightheadedness associated with low blood pressure.  Presently, she feels well.  She continues to be on isosorbide  30 mg, metoprolol  succinate 25 mg, and ranolazine  500 mg twice a day.  She is not having any recent anginal symptoms.  She is on rosuvastatin  20 mg for hyperlipidemia as well as Zetia 10 mg.  She also is on Kerendia  for renal  preservation  initiated by Dr. Britta Candy.  She is not having any anginal symptoms or significant shortness of breath.  Blood pressure tends to to be on the low side but she does denies any presyncope or syncope.  Remotely she had been on CPAP therapy but stopped use over 4-1/2 years ago.  She has seen Dr. Dante Dyer for right hip pain and ultimately is planned now to have right hip surgery by Dr. Bernard Brick tentatively scheduled for May 27.  She presents for evaluation.  Past Medical History:  Diagnosis Date   Acute bronchitis    Anemia    hx of anemia   Anginal pain (HCC)    Arthritis    osteo   Asthma    Chronic kidney disease (CKD), stage IV (severe) (HCC)    Coronary atherosclerosis    Cough    GERD (gastroesophageal reflux disease)    History of colon polyps    History of migraine    Hyperlipidemia    Hypertension    Obstructive sleep apnea (adult) (pediatric)    NO CPAP    Peripheral neuropathy    Type II or unspecified type diabetes mellitus without mention of complication, not stated as uncontrolled    type 2   Unspecified hypothyroidism     Past Surgical History:  Procedure Laterality Date   ANTERIOR FUSION CERVICAL SPINE  2009   CARPAL TUNNEL RELEASE Left    COLONOSCOPY     CORONARY STENT INTERVENTION  2007   LEFT HEART CATHETERIZATION WITH CORONARY ANGIOGRAM N/A 05/29/2014   Procedure: LEFT HEART CATHETERIZATION WITH CORONARY ANGIOGRAM;  Surgeon: Mickiel Albany, MD;  Location: [facility] CATH LAB;  Service: Cardiovascular;  Laterality: N/A;   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   Procedure: TOTAL KNEE ARTHROPLASTY;  Surgeon: Orvan Blanch, MD;  Location: [facility] ORS;  Service: Orthopedics;  Laterality: Left;  3 hrs   TOTAL KNEE ARTHROPLASTY Right 10/01/2021   Procedure: TOTAL KNEE ARTHROPLASTY;  Surgeon: Orvan Blanch, MD;  Location: [facility] ORS;  Service: Orthopedics;  Laterality: Right;   TUBAL LIGATION  1990   WISDOM TOOTH EXTRACTION      Allergies  Allergen Reactions    Allopurinol Rash    Toxic epidermal necrolysis    Codeine Other (See Comments)    REACTION: hallucinations, funny in the head    Levaquin [Levofloxacin] Nausea And Vomiting   Sulfa Antibiotics Hives   Sulfonamide Derivatives Hives    Current Outpatient Medications  Medication Sig Dispense Refill   albuterol  (PROVENTIL  HFA) 108 (90 Base) MCG/ACT inhaler Inhale 2 puffs into the lungs every 6 (six) hours as needed for wheezing or shortness of breath. For shortness of breath and wheezing 18 each 2   Ascorbic Acid  (VITAMIN C PO) Take 1 tablet by mouth daily.     BIOTIN PO Take 500 mg by mouth daily.     budesonide -formoterol  (SYMBICORT ) 160-4.5 MCG/ACT inhaler Inhale 2 puffs into the lungs 2 (two) times daily. 1 each 6   Carboxymeth-Glyc-Polysorb PF (REFRESH OPTIVE MEGA-3) 0.5-1-0.5 % SOLN Place 1 drop into both eyes daily in the afternoon.     cephALEXin (KEFLEX) 500 MG capsule Take 500 mg by mouth.     clotrimazole  (MYCELEX ) 10 MG troche Take 1 tablet (10 mg total) by mouth 5 (five) times daily. 50 Troche 0   colchicine  0.6 MG tablet Take 0.6 mg by mouth as needed.     Cyanocobalamin  (B-12 PO) Take 1,000 mcg by mouth daily.     Dexlansoprazole 30 MG capsule  DR Take 30 mg by mouth as needed.     diazepam  (VALIUM ) 5 MG tablet Take 5 mg by mouth daily as needed for muscle spasms (cramps).     Diclofenac Sodium (VOLTAREN PO) Take by mouth daily.     ezetimibe (ZETIA) 10 MG tablet Take 10 mg by mouth daily.     Finerenone  (KERENDIA ) 10 MG TABS Take 10 mg by mouth daily.     fluticasone  (FLONASE ) 50 MCG/ACT nasal spray Place 2 sprays into both nostrils daily. (Patient taking differently: Place 2 sprays into both nostrils as needed.) 18.2 mL 11   furosemide  (LASIX ) 40 MG tablet Take 1 tablet (40 mg total) by mouth daily. In the morning (Patient taking differently: Take 40-60 mg by mouth See admin instructions. Take 60 mg in the morning and 20 mg bedtime) 90 tablet 3   hydrocortisone  2.5 % cream  Apply topically 2 (two) times daily. Apply as needed two times daily 30 g 0   ipratropium-albuterol  (DUONEB) 0.5-2.5 (3) MG/3ML SOLN Take 3 mLs by nebulization every 4 (four) hours as needed. 360 mL 11   isosorbide  mononitrate (IMDUR ) 30 MG 24 hr tablet Take 1 tablet (30 mg total) by mouth daily. 30 tablet 11   JARDIANCE  10 MG TABS tablet Take 10 mg by mouth daily.     Lancets (ONETOUCH DELICA PLUS LANCET33G) MISC Apply 1 each topically 4 (four) times daily.     levothyroxine  (SYNTHROID ) 150 MCG tablet Take 150 mcg by mouth daily before breakfast.     meclizine (ANTIVERT) 25 MG tablet Take 25 mg by mouth as needed.     Mepolizumab  (NUCALA ) 100 MG/ML SOAJ Inject 1 mL (100 mg total) into the skin every 28 (twenty-eight) days. 1 mL 5   metoprolol  succinate (TOPROL  XL) 25 MG 24 hr tablet Take 1 tablet (25 mg total) by mouth daily. (Patient taking differently: Take 12.5 mg by mouth daily.) 90 tablet 3   Multiple Vitamin (MULTIVITAMIN WITH MINERALS) TABS tablet Take 1 tablet by mouth daily.     nitroGLYCERIN  (NITROLINGUAL ) 0.4 MG/SPRAY spray Place 1 spray under the tongue every 5 (five) minutes x 3 doses as needed for chest pain. 12 g 5   NOVOFINE PLUS PEN NEEDLE 32G X 4 MM MISC Inject into the skin.     ONE TOUCH ULTRA TEST test strip Use as directed     potassium chloride  (KLOR-CON  M) 10 MEQ tablet Take 1 tablet (10 mEq total) by mouth daily. 90 tablet 3   predniSONE  (DELTASONE ) 20 MG tablet Take 1 tablet (20 mg total) by mouth daily with breakfast. 7 tablet 0   pregabalin  (LYRICA ) 100 MG capsule Take 100 mg by mouth 2 (two) times daily.     Pyridoxine  HCl (B-6 PO) Take 1 tablet by mouth daily.     ranolazine  (RANEXA ) 500 MG 12 hr tablet Take 1 tablet (500 mg total) by mouth 2 (two) times daily. 180 tablet 3   Respiratory Therapy Supplies (FLUTTER) DEVI 1 puff by Does not apply route daily. 1 each 0   rosuvastatin  (CRESTOR ) 20 MG tablet TAKE 1 TABLET BY MOUTH DAILY 90 tablet 3   tirzepatide  (MOUNJARO) 5 MG/0.5ML Pen Inject 5 mg into the skin once a week.     triamterene -hydrochlorothiazide  (MAXZIDE -25) 37.5-25 MG tablet TAKE 1 TABLET BY MOUTH DAILY 90 tablet 3   XIIDRA 5 % SOLN      No current facility-administered medications for this visit.    Social History   Socioeconomic  History   Marital status: Widowed    Spouse name: Not on file   Number of children: 0   Years of education: Not on file   Highest education level: Not on file  Occupational History   Occupation: retired  Tobacco Use   Smoking status: Former    Current packs/day: 0.00    Average packs/day: 0.5 packs/day for 25.0 years (12.5 ttl pk-yrs)    Types: Cigarettes    Start date: 08/08/1978    Quit date: 08/09/2003    Years since quitting: 20.3   Smokeless tobacco: Never  Vaping Use   Vaping status: Never Used  Substance and Sexual Activity   Alcohol  use: No    Alcohol /week: 0.0 standard drinks of alcohol    Drug use: No   Sexual activity: Not on file  Other Topics Concern   Not on file  Social History Narrative   Not on file   Social Drivers of Health   Financial Resource Strain: Not on file  Food Insecurity: Low Risk  (11/22/2023)   Received from [facility]   Hunger Vital Sign    Worried About Running Out of Food in the Last Year: Never true    Ran Out of Food in the Last Year: Never true  Transportation Needs: No Transportation Needs (11/22/2023)   Received from [facility]    In the past 12 months, has lack of reliable transportation kept you from medical appointments, meetings, work or from getting things needed for daily living? : No  Physical Activity: Not on file  Stress: Not on file  Social Connections: Not on file  Intimate Partner Violence: Not on file   Social she is widowed. She does try to be active. There is no tobacco use. She does drink occasional alcohol .  Family History  Problem Relation Age of Onset   Hypertension Mother    Heart disease Mother     Stroke Mother    Prostate cancer Father    Heart disease Brother    Heart attack Sister    ROS General: Negative; No fevers, chills, or night sweats; positive for 40 pound weight loss HEENT: Negative; No changes in vision or hearing, sinus congestion, difficulty swallowing Pulmonary: Negative; No cough, wheezing, shortness of breath, hemoptysis Cardiovascular: Negative; No chest pain, presyncope, syncope, palpitations Positive for rare, intermittent leg swelling GI: Positive for GERD; No nausea, vomiting, diarrhea, or abdominal pain GU: Negative; No dysuria, hematuria, or difficulty voiding Musculoskeletal: Positive for arthritis in her knees and hips; plan for right hip surgery May 2025 Hematologic/Oncology: Negative; no easy bruising, bleeding Endocrine: Positive for hypothyroidism, and type 2 diabetes mellitus; Neuro: Negative; no changes in balance, headaches Skin: Negative; No rashes or skin lesions Psychiatric: Negative; No behavioral problems, depression Sleep: Positive OSA previously on CPAP, has not used in 4 to 5 years; no daytime sleepiness, hypersomnolence, bruxism, restless legs, hypnogognic hallucinations, no cataplexy Other comprehensive 14 point system review is negative.   PE BP 108/60   Pulse 69   Ht 5\' 2"  (1.575 m)   Wt 160 lb 12.8 oz (72.9 kg)   SpO2 99%   BMI 29.41 kg/m    Repeat blood pressure by me was 102/60  Wt Readings from Last 3 Encounters:  11/29/23 160 lb 12.8 oz (72.9 kg)  09/05/23 157 lb 3.2 oz (71.3 kg)  08/18/23 157 lb 3.2 oz (71.3 kg)   General: Alert, oriented, no distress.  Skin: normal turgor, no rashes, warm and dry HEENT:  Normocephalic, atraumatic. Pupils equal round and reactive to light; sclera anicteric; extraocular muscles intact;  Nose without nasal septal hypertrophy Mouth/Parynx benign; Mallinpatti scale 3 Neck: No JVD, no carotid bruits; normal carotid upstroke Lungs: clear to ausculatation and percussion; no wheezing or  rales Chest wall: without tenderness to palpitation Heart: PMI not displaced, RRR, s1 s2 normal, 1/6 systolic murmur, no diastolic murmur, no rubs, gallops, thrills, or heaves Abdomen: soft, nontender; no hepatosplenomehaly, BS+; abdominal aorta nontender and not dilated by palpation. Back: no CVA tenderness Pulses 2+ Musculoskeletal: full range of motion, normal strength, no joint deformities Extremities: no clubbing cyanosis or edema, Homan's sign negative  Neurologic: grossly nonfocal; Cranial nerves grossly wnl Psychologic: Normal mood and affect    General: Alert, oriented, no distress.  Skin: normal turgor, no rashes, warm and dry HEENT: Normocephalic, atraumatic. Pupils equal round and reactive to light; sclera anicteric; extraocular muscles intact;  Nose without nasal septal hypertrophy Mouth/Parynx benign; Mallinpatti scale 3 Neck: No JVD, no carotid bruits; normal carotid upstroke Lungs: clear to ausculatation and percussion; no wheezing or rales Chest wall: without tenderness to palpitation Heart: PMI not displaced, RRR, s1 s2 normal, 1/6 systolic murmur, no diastolic murmur, no rubs, gallops, thrills, or heaves Abdomen: soft, nontender; no hepatosplenomehaly, BS+; abdominal aorta nontender and not dilated by palpation. Back: no CVA tenderness Pulses 2+ Musculoskeletal: full range of motion, normal strength, no joint deformities Extremities: no clubbing cyanosis or edema, Homan's sign negative  Neurologic: grossly nonfocal; Cranial nerves grossly wnl Psychologic: Normal mood and affect  EKG Interpretation Date/Time:  Wednesday November 29 2023 11:56:13 EDT Ventricular Rate:  69 PR Interval:  166 QRS Duration:  76 QT Interval:  412 QTC Calculation: 441 R Axis:   45  Text Interpretation: Normal sinus rhythm with sinus arrhythmia Nonspecific T wave abnormality When compared with ECG of 15-Nov-2021 15:22, No significant change was found Confirmed by Magnus Schuller ([id number]) on  11/29/2023 12:59:52 PM    I personally reviewed ECG from November 14, 2022 which shows normal sinus rhythm at 76 bpm.  There are nonspecific ST changes  August 10, 2021 ECG (independently read by me): NSR at 67; normal intervals.  Baseline artifact.   March 19, 2021 ECG (independently read by me): NSR at 66, no significant STT changes; QTc 452 msec  January 20, 2021 ECG (independently read by me):  NSR at 76, no ectopy; QTc 452 msec  December 2020 ECG (independently read by me): NSRat 63; nonspecific T wave abnormality   September 2019 ECG (independently read by me): Normal sinus rhythm at 78 bpm.  No ectopy.  Normal intervals.     June 2020 ECG (independently read by me): NSR at 88; normal intervals. Nonspecific T changes.  May 2018 ECG (independently read by me): Sinus rhythm at 68 bpm.  Nonspecific T changes.  Normal intervals  September 2017 ECG (independently read by me): Normal sinus rhythm at 74 bpm with mild sinus arrhythmia.  Mild inferolateral T wave abnormalities.  September 2015 ECG (independently read by me): Normal sinus rhythm at 61 beats per minute.  QTc interval 4-6 ms.  Nonspecific ST-T changes inferolaterally  December 2014 ECG: Normal sinus rhythm at 62 beats per minute. Nonspecific inferolateral T wave changes.  LABS:    Latest Ref Rng & Units 12/03/2021    2:48 PM 11/19/2021    3:49 PM 11/15/2021    3:39 PM  BMP  Glucose 70 - 99 mg/dL 94  130  865   BUN 8 -  27 mg/dL 26  50  40   Creatinine 0.57 - 1.00 mg/dL 2.84  1.32  4.40   BUN/Creat Ratio 12 - 28 14  24     Sodium 134 - 144 mmol/L 142  141  136   Potassium 3.5 - 5.2 mmol/L 4.3  3.3  3.2   Chloride 96 - 106 mmol/L 101  96  102   CO2 20 - 29 mmol/L 26  24  23    Calcium  8.7 - 10.3 mg/dL 9.2  9.4  8.7       Latest Ref Rng & Units 11/15/2021    3:39 PM 02/17/2021    9:47 AM 04/29/2020    3:33 AM  Hepatic Function  Total Protein 6.5 - 8.1 g/dL 7.9  7.5    Albumin 3.5 - 5.0 g/dL 3.8  4.0  2.9   AST 15 - 41 U/L 18   13    ALT 0 - 44 U/L 10  10    Alk Phosphatase 38 - 126 U/L 102  109    Total Bilirubin 0.3 - 1.2 mg/dL 0.7  0.3        Latest Ref Rng & Units 11/15/2021    3:39 PM 10/02/2021    9:15 AM 09/21/2021    1:28 PM  CBC  WBC 4.0 - 10.5 K/uL 6.9  10.7  6.1   Hemoglobin 12.0 - 15.0 g/dL 10.2  72.5  36.6   Hematocrit 36.0 - 46.0 % 36.2  31.8  35.6   Platelets 150 - 400 K/uL 307  229  252    Lab Results  Component Value Date   MCV 79.7 (L) 11/15/2021   MCV 80.9 10/02/2021   MCV 80.5 09/21/2021   Lab Results  Component Value Date   TSH 1.830 03/28/2018     Lab Results  Component Value Date   HGBA1C 6.2 (H) 09/21/2021   Lipid Panel     Component Value Date/Time   CHOL 186 02/17/2021 0947   TRIG 239 (H) 02/17/2021 0947   HDL 43 02/17/2021 0947   CHOLHDL 4.3 02/17/2021 0947   CHOLHDL 2.7 05/30/2014 0400   VLDL 31 05/30/2014 0400   LDLCALC 102 (H) 02/17/2021 0947    RADIOLOGY: No results found.  IMPRESSION:  1. Essential hypertension   2. Coronary artery disease involving native coronary artery of native heart without angina pectoris   3. Type 2 diabetes mellitus with complication, with long-term current use of insulin  (HCC)   4. CKD (chronic kidney disease) stage 4, GFR 15-29 ml/min (HCC)   5. Hyperlipidemia LDL goal <55   6. Hypothyroidism, unspecified type     ASSESSMENT AND PLAN: Ms. Behe is a 68 year old female with established CAD and is status post percutaneous coronary intervention to her proximal RCA in 2007.  She had a 60 - 70% stenosis of her LAD as well as 20-30%. She has experienced recurrent episodes of probable musculoskeletal chest discomfort.  A nuclear stress test following in 2015 was interpreted as high risk; cardiac catheterization did not reveal significant change in her coronary anatomy and her previously placed RCA stent was widely patent.  Her LAD lesion was not physiologically significant by fractional flow reserve.  Due to exertional dyspnea, she  underwent a follow-up echo Doppler study in September 2020 which revealed normal systolic and diastolic function with mild LVH and mild aortic insufficiency.  SHe was started on  Ranexa  500 mg twice a day by Ervin Heath, PA for chest pain which did  improve her symptomatology.  At her visit with me in June 2022 her blood pressure was low and she was orthostatic.  At that time since she was taking furosemide  40 mg in the morning and 20 mg later in the day I recommended she discontinue her Maxide.  Apparently she developed recurrent leg swelling approximately 4 to 5 days later and admits to an 8 pound weight gain predominantly of fluid.  She therefore resumed her Maxide.  She has stage IV CKD and is followed by Dr. Britta Candy at  [facility] and was started on Kerendia  10 mg daily for renal preservation.  At my visit with her in January 2023 her blood pressure was low and I decreased isosorbide  down to 30 mg.  Since I saw her, she has been on Mounjaro and admits to a total of 40 pound weight loss.  Metoprolol  dose was decreased at her 2024 visit.  Presently, she remains asymptomatic without chest pain and denies any exertional shortness of breath.  She has been bothered with right hip discomfort for which she had seen Dr. Dante Dyer and is tentatively scheduled to undergo right hip surgery by Dr. Bernard Brick on Jan 02, 2024.  Her blood pressure today is stable but low 102/60 when checked by me and 108/60 upon presentation.  She continues to be on low-dose isosorbide  at 30 mg and Ranexa  500 mg twice a day as well as metoprolol  succinate 25 mg daily.  She continues to be on rosuvastatin  20 mg as well as Zetia 10 mg for lipid management.  Target LDL ideally is less than 55 with her CAD and diabetes mellitus.  She is diabetic on Jardiance  and is on Mounjaro.  She initially was started on Kerendia  for renal preservation.  With stage IV CKD.  She is followed by Dr. Britta Candy of nephrology.  Creatinine was 2.3 in April 2024 and did not  have her most recent assessment.  I am giving her clearance to undergo her hip surgery scheduled for May.  I discussed my upcoming retirement.  I will transition her to the care of Dr. Alvis Ba for evaluation in November 2025.  Her blood pressure today is low, and initially was 104/70 when she walked into the office but when rechecked by me was lower at 88/60.  Side.  I am recommending she decrease her Lasix  from 40 mg in the morning and 20 mg at night and only take the morning dose.  I also am slightly changing her metoprolol  succinate from 25 mg daily to 25 mg alternating with 12.5 mg every other day.  If blood pressure remains low she will decrease this to just 12.5 mg daily.  She continues to be on ranolazine  500 mg twice a day and is not having anginal symptoms.  She is on rosuvastatin  20 mg daily.  Most recent LDL in September was 96.  She would benefit from the addition of Zetia 10 mg with target LDL less than 70.  She will follow-up with Dr. Jesse Moritz with her diabetes and hypothyroidism, currently on Jardiance  and Mounjaro as well as levothyroxine .  I am scheduling her to see Marlana Silvan, NP in 3 months and I will see her subsequent to [facility] evaluation.      Millicent Ally, MD, [facility]  12/01/2023 9:16 AM

## 2023-11-29 NOTE — Patient Instructions (Signed)
 Medication Instructions:  NO CHANGES *If you need a refill on your cardiac medications before your next appointment, please call your pharmacy*  Lab Work: NO LABS If you have labs (blood work) drawn today and your tests are completely normal, you will receive your results only by: MyChart Message (if you have MyChart) OR A paper copy in the mail If you have any lab test that is abnormal or we need to change your treatment, we will call you to review the results.  Testing/Procedures: NO TESTING  Follow-Up: At [facility], you and your health needs are our priority.  As part of our continuing mission to provide you with exceptional heart care, our providers are all part of one team.  This team includes your primary Cardiologist (physician) and Advanced Practice Providers or APPs (Physician Assistants and Nurse Practitioners) who all work together to provide you with the care you need, when you need it.  Your next appointment:   6 month(s)  Provider:   Luana Rumple, MD     Other Instructions You are cleared for your surgery      1st Floor: - Lobby - Registration  - Pharmacy  - Lab - Cafe  2nd Floor: - PV Lab - Diagnostic Testing (echo, CT, nuclear med)  3rd Floor: - Vacant  4th Floor: - TCTS (cardiothoracic surgery) - AFib Clinic - Structural Heart Clinic - Vascular Surgery  - Vascular Ultrasound  5th Floor: - [facility] (general and EP) - Clinical Pharmacy for coumadin, hypertension, lipid, weight-loss medications, and med management appointments    Valet parking services will be available as well.

## 2023-12-01 ENCOUNTER — Encounter: Payer: Self-pay | Admitting: Cardiovascular Disease

## 2023-12-12 ENCOUNTER — Encounter (HOSPITAL_BASED_OUTPATIENT_CLINIC_OR_DEPARTMENT_OTHER): Payer: Self-pay

## 2023-12-12 ENCOUNTER — Other Ambulatory Visit (HOSPITAL_BASED_OUTPATIENT_CLINIC_OR_DEPARTMENT_OTHER): Payer: Self-pay | Admitting: Endocrinology

## 2023-12-12 ENCOUNTER — Other Ambulatory Visit: Payer: Self-pay

## 2023-12-12 ENCOUNTER — Ambulatory Visit (HOSPITAL_BASED_OUTPATIENT_CLINIC_OR_DEPARTMENT_OTHER)
Admission: RE | Admit: 2023-12-12 | Discharge: 2023-12-12 | Disposition: A | Discharge status: Home / Self Care | Source: Ambulatory Visit | Attending: Endocrinology | Admitting: Endocrinology

## 2023-12-12 ENCOUNTER — Other Ambulatory Visit (HOSPITAL_COMMUNITY): Payer: Self-pay

## 2023-12-12 ENCOUNTER — Other Ambulatory Visit: Payer: Self-pay | Admitting: Pharmacy Technician

## 2023-12-12 DIAGNOSIS — Z1231 Encounter for screening mammogram for malignant neoplasm of breast: Secondary | ICD-10-CM | POA: Insufficient documentation

## 2023-12-12 NOTE — Progress Notes (Signed)
 Specialty Pharmacy Refill Coordination Note  [name] is a 68 y.o. female contacted today regarding refills of specialty medication(s) Mepolizumab  (Nucala )   Patient requested (Patient-Rptd) Pickup at [facility] at [place] date: 12/20/23   Medication will be filled on 12/20/23.

## 2023-12-19 NOTE — Progress Notes (Addendum)
 COVID Vaccine Completed: yes  Date of COVID positive in last 90 days:  PCP - Rosslyn Coons, MD Cardiologist - Magnus Schuller, MD LOV 11/29/23 Pulmonologist- Dr. Gaynell Keeler LOV 09/05/23  Medical/cardiac clearance by Dr. Jesse Moritz 12/06/23 in media tab  Chest x-ray - n/a EKG - 11/29/23 Epic Stress Test - 05/29/14 Epic ECHO - 07/24/23 Epic Cardiac Cath - 2015 Pacemaker/ICD device last checked: n/a Spinal Cord Stimulator:n/a  Bowel Prep - no  Sleep Study - yes, no longer an issue due to weight lose CPAP -   Fasting Blood Sugar -  Checks Blood Sugar 1-2 times a day  Last dose of GLP1 agonist-  Mounjaro, takes on Thursdays GLP1 instructions:  Hold 7 days before surgery. Last dose 12/14/23.   Last dose of SGLT-2 inhibitors-  Jardiance   SGLT-2 instructions:  Hold 3 days before surgery, last dose 12/29/23,   Blood Thinner Instructions:  Last dose:   Time: Aspirin  Instructions: Last Dose:  Activity level: Can go up a flight of stairs and perform activities of daily living without stopping and without symptoms of chest pain or shortness of breath.   Anesthesia review: coronary atherosclerosis, HTN, OSA, DM2, CP, asthma, CKD, stent x1, creatinine 1.91  Patient denies shortness of breath, fever, cough and chest pain at PAT appointment  Patient verbalized understanding of instructions that were given to them at the PAT appointment. Patient was also instructed that they will need to review over the PAT instructions again at home before surgery.

## 2023-12-19 NOTE — Patient Instructions (Signed)
 SURGICAL WAITING ROOM VISITATION  Patients having surgery or a procedure may have no more than 2 support people in the waiting area - these visitors may rotate.    Children under the age of 85 must have an adult with them who is not the patient.  Due to an increase in RSV and influenza rates and associated hospitalizations, children ages 65 and under may not visit patients in [facility] hospitals.  Visitors with respiratory illnesses are discouraged from visiting and should remain at home.  If the patient needs to stay at the hospital during part of their recovery, the visitor guidelines for inpatient rooms apply. Pre-op nurse will coordinate an appropriate time for 1 support person to accompany patient in pre-op.  This support person may not rotate.    Please refer to the [facility] website for the visitor guidelines for Inpatients (after your surgery is over and you are in a regular room).    Your procedure is scheduled on: 01/02/24   Report to [facility] Main Entrance    Report to admitting at 1:45 PM   Call this number if you have problems the morning of surgery [phone]   Do not eat food :After Midnight.   After Midnight you may have the following liquids until 1:15 PM DAY OF SURGERY  Water  Non-Citrus Juices (without pulp, NO RED-Apple, White grape, White cranberry) Black Coffee (NO MILK/CREAM OR CREAMERS, sugar ok)  Clear Tea (NO MILK/CREAM OR CREAMERS, sugar ok) regular and decaf                             Plain Jell-O (NO RED)                                           Fruit ices (not with fruit pulp, NO RED)                                     Popsicles (NO RED)                                                               Sports drinks like Gatorade (NO RED)     The day of surgery:  Drink ONE (1) Pre-Surgery G2 at 1:15 PM the morning of surgery. Drink in one sitting. Do not sip.  This drink was given to you during your hospital  pre-op appointment  visit. Nothing else to drink after completing the  Pre-Surgery G2.          If you have questions, please contact your surgeon's office.   FOLLOW BOWEL PREP AND ANY ADDITIONAL PRE OP INSTRUCTIONS YOU RECEIVED FROM YOUR SURGEON'S OFFICE!!!     Oral Hygiene is also important to reduce your risk of infection.                                    Remember - BRUSH YOUR TEETH THE MORNING OF SURGERY WITH YOUR REGULAR TOOTHPASTE  DENTURES WILL  BE REMOVED PRIOR TO SURGERY PLEASE DO NOT APPLY "Poly grip" OR ADHESIVES!!!   Stop all vitamins and herbal supplements 7 days before surgery.   Take these medicines the morning of surgery with A SIP OF WATER : Inhalers, Diazepam , Zetia, Isosorbide , Levothyroxine , Metoprolol , Lyrica , Rosuvastatin    DO NOT TAKE ANY ORAL DIABETIC MEDICATIONS DAY OF YOUR SURGERY  How to Manage Your Diabetes Before and After Surgery  Why is it important to control my blood sugar before and after surgery? Improving blood sugar levels before and after surgery helps healing and can limit problems. A way of improving blood sugar control is eating a healthy diet by:  Eating less sugar and carbohydrates  Increasing activity/exercise  Talking with your doctor about reaching your blood sugar goals High blood sugars (greater than 180 mg/dL) can raise your risk of infections and slow your recovery, so you will need to focus on controlling your diabetes during the weeks before surgery. Make sure that the doctor who takes care of your diabetes knows about your planned surgery including the date and location.  How do I manage my blood sugar before surgery? Check your blood sugar at least 4 times a day, starting 2 days before surgery, to make sure that the level is not too high or low. Check your blood sugar the morning of your surgery when you wake up and every 2 hours until you get to the Short Stay unit. If your blood sugar is less than 70 mg/dL, you will need to treat for low blood  sugar: Do not take insulin . Treat a low blood sugar (less than 70 mg/dL) with  cup of clear juice (cranberry or apple), 4 glucose tablets, OR glucose gel. Recheck blood sugar in 15 minutes after treatment (to make sure it is greater than 70 mg/dL). If your blood sugar is not greater than 70 mg/dL on recheck, call [phone] for further instructions. Report your blood sugar to the short stay nurse when you get to Short Stay.  If you are admitted to the hospital after surgery: Your blood sugar will be checked by the staff and you will probably be given insulin  after surgery (instead of oral diabetes medicines) to make sure you have good blood sugar levels. The goal for blood sugar control after surgery is 80-180 mg/dL.   WHAT DO I DO ABOUT MY DIABETES MEDICATION?  Do not take oral diabetes medicines (pills) the morning of surgery.  Do not take Mounjaro after 12/27/23.  Hold Jardiance  for 3 days prior to surgery. Last dose 12/29/23  THE DAY BEFORE SURGERY, take morning dose of Humalog  if needed, take 50% of evening dose if needed     THE MORNING OF SURGERY, do not take Humalog  unless blood sugar is greater than 220, then take 50% of dose  DO NOT TAKE THE FOLLOWING 7 DAYS PRIOR TO SURGERY: Ozempic, Wegovy, Rybelsus (Semaglutide), Byetta (exenatide), Bydureon (exenatide ER), Victoza , Saxenda  (liraglutide ), or Trulicity (dulaglutide) Mounjaro (Tirzepatide) Adlyxin (Lixisenatide), Polyethylene Glycol Loxenatide.  If your CBG is greater than 220 mg/dL, you may take  of your sliding scale  (correction) dose of insulin .  Reviewed and Endorsed by [facility] Patient Education Committee, August 2015                              You may not have any metal on your body including hair pins, jewelry, and body piercing  Do not wear make-up, lotions, powders, perfumes, or deodorant  Do not wear nail polish including gel and S&S, artificial/acrylic nails, or any other type of covering on  natural nails including finger and toenails. If you have artificial nails, gel coating, etc. that needs to be removed by a nail salon please have this removed prior to surgery or surgery may need to be canceled/ delayed if the surgeon/ anesthesia feels like they are unable to be safely monitored.   Do not shave  48 hours prior to surgery.    Do not bring valuables to the hospital. [facility] IS NOT             RESPONSIBLE   FOR VALUABLES.   Contacts, glasses, dentures or bridgework may not be worn into surgery.   Bring small overnight bag day of surgery.   DO NOT BRING YOUR HOME MEDICATIONS TO THE HOSPITAL. PHARMACY WILL DISPENSE MEDICATIONS LISTED ON YOUR MEDICATION LIST TO YOU DURING YOUR ADMISSION IN THE HOSPITAL!              Please read over the following fact sheets you were given: IF YOU HAVE QUESTIONS ABOUT YOUR PRE-OP INSTRUCTIONS PLEASE CALL [phone] [name]   If you received a COVID test during your pre-op visit  it is requested that you wear a mask when out in public, stay away from anyone that may not be feeling well and notify your surgeon if you develop symptoms. If you test positive for Covid or have been in contact with anyone that has tested positive in the last 10 days please notify you surgeon.      Pre-operative 5 CHG Bath Instructions   You can play a key role in reducing the risk of infection after surgery. Your skin needs to be as free of germs as possible. You can reduce the number of germs on your skin by washing with CHG (chlorhexidine  gluconate) soap before surgery. CHG is an antiseptic soap that kills germs and continues to kill germs even after washing.   DO NOT use if you have an allergy  to chlorhexidine /CHG or antibacterial soaps. If your skin becomes reddened or irritated, stop using the CHG and notify one of our RNs at [phone].   Please shower with the CHG soap starting 4 days before surgery using the following schedule:     Please keep in  mind the following:  DO NOT shave, including legs and underarms, starting the day of your first shower.   You may shave your face at any point before/day of surgery.  Place clean sheets on your bed the day you start using CHG soap. Use a clean washcloth (not used since being washed) for each shower. DO NOT sleep with pets once you start using the CHG.   CHG Shower Instructions:  If you choose to wash your hair and private area, wash first with your normal shampoo/soap.  After you use shampoo/soap, rinse your hair and body thoroughly to remove shampoo/soap residue.  Turn the water  OFF and apply about 3 tablespoons (45 ml) of CHG soap to a CLEAN washcloth.  Apply CHG soap ONLY FROM YOUR NECK DOWN TO YOUR TOES (washing for 3-5 minutes)  DO NOT use CHG soap on face, private areas, open wounds, or sores.  Pay special attention to the area where your surgery is being performed.  If you are having back surgery, having someone wash your back for you may be helpful. Wait 2 minutes after CHG soap is applied, then  you may rinse off the CHG soap.  Pat dry with a clean towel  Put on clean clothes/pajamas   If you choose to wear lotion, please use ONLY the CHG-compatible lotions on the back of this paper.     Additional instructions for the day of surgery: DO NOT APPLY any lotions, deodorants, cologne, or perfumes.   Put on clean/comfortable clothes.  Brush your teeth.  Ask your nurse before applying any prescription medications to the skin.      CHG Compatible Lotions   Aveeno Moisturizing lotion  Cetaphil Moisturizing Cream  Cetaphil Moisturizing Lotion  Clairol Herbal Essence Moisturizing Lotion, Dry Skin  Clairol Herbal Essence Moisturizing Lotion, Extra Dry Skin  Clairol Herbal Essence Moisturizing Lotion, Normal Skin  Curel Age Defying Therapeutic Moisturizing Lotion with Alpha Hydroxy  Curel Extreme Care Body Lotion  Curel Soothing Hands Moisturizing Hand Lotion  Curel Therapeutic  Moisturizing Cream, Fragrance-Free  Curel Therapeutic Moisturizing Lotion, Fragrance-Free  Curel Therapeutic Moisturizing Lotion, Original Formula  Eucerin Daily Replenishing Lotion  Eucerin Dry Skin Therapy Plus Alpha Hydroxy Crme  Eucerin Dry Skin Therapy Plus Alpha Hydroxy Lotion  Eucerin Original Crme  Eucerin Original Lotion  Eucerin Plus Crme Eucerin Plus Lotion  Eucerin TriLipid Replenishing Lotion  Keri Anti-Bacterial Hand Lotion  Keri Deep Conditioning Original Lotion Dry Skin Formula Softly Scented  Keri Deep Conditioning Original Lotion, Fragrance Free Sensitive Skin Formula  Keri Lotion Fast Absorbing Fragrance Free Sensitive Skin Formula  Keri Lotion Fast Absorbing Softly Scented Dry Skin Formula  Keri Original Lotion  Keri Skin Renewal Lotion Keri Silky Smooth Lotion  Keri Silky Smooth Sensitive Skin Lotion  Nivea Body Creamy Conditioning Oil  Nivea Body Extra Enriched Teacher, adult education Moisturizing Lotion Nivea Crme  Nivea Skin Firming Lotion  NutraDerm 30 Skin Lotion  NutraDerm Skin Lotion  NutraDerm Therapeutic Skin Cream  NutraDerm Therapeutic Skin Lotion  ProShield Protective Hand Cream  Provon moisturizing lotion  WHAT IS A BLOOD TRANSFUSION? Blood Transfusion Information  A transfusion is the replacement of blood or some of its parts. Blood is made up of multiple cells which provide different functions. Red blood cells carry oxygen and are used for blood loss replacement. White blood cells fight against infection. Platelets control bleeding. Plasma helps clot blood. Other blood products are available for specialized needs, such as hemophilia or other clotting disorders. BEFORE THE TRANSFUSION  Who gives blood for transfusions?  Healthy volunteers who are fully evaluated to make sure their blood is safe. This is blood bank blood. Transfusion therapy is the safest it has ever been in the practice of medicine. Before  blood is taken from a donor, a complete history is taken to make sure that person has no history of diseases nor engages in risky social behavior (examples are intravenous drug use or sexual activity with multiple partners). The donor's travel history is screened to minimize risk of transmitting infections, such as malaria. The donated blood is tested for signs of infectious diseases, such as HIV and hepatitis. The blood is then tested to be sure it is compatible with you in order to minimize the chance of a transfusion reaction. If you or a relative donates blood, this is often done in anticipation of surgery and is not appropriate for emergency situations. It takes many days to process the donated blood. RISKS AND COMPLICATIONS Although transfusion therapy is very safe and saves many lives, the main dangers of transfusion include:  Getting an infectious  disease. Developing a transfusion reaction. This is an allergic reaction to something in the blood you were given. Every precaution is taken to prevent this. The decision to have a blood transfusion has been considered carefully by your caregiver before blood is given. Blood is not given unless the benefits outweigh the risks. AFTER THE TRANSFUSION Right after receiving a blood transfusion, you will usually feel much better and more energetic. This is especially true if your red blood cells have gotten low (anemic). The transfusion raises the level of the red blood cells which carry oxygen, and this usually causes an energy increase. The nurse administering the transfusion will monitor you carefully for complications. HOME CARE INSTRUCTIONS  No special instructions are needed after a transfusion. You may find your energy is better. Speak with your caregiver about any limitations on activity for underlying diseases you may have. SEEK MEDICAL CARE IF:  Your condition is not improving after your transfusion. You develop redness or irritation at the  intravenous (IV) site. SEEK IMMEDIATE MEDICAL CARE IF:  Any of the following symptoms occur over the next 12 hours: Shaking chills. You have a temperature by mouth above 102 F (38.9 C), not controlled by medicine. Chest, back, or muscle pain. People around you feel you are not acting correctly or are confused. Shortness of breath or difficulty breathing. Dizziness and fainting. You get a rash or develop hives. You have a decrease in urine output. Your urine turns a dark color or changes to pink, red, or brown. Any of the following symptoms occur over the next 10 days: You have a temperature by mouth above 102 F (38.9 C), not controlled by medicine. Shortness of breath. Weakness after normal activity. The white part of the eye turns yellow (jaundice). You have a decrease in the amount of urine or are urinating less often. Your urine turns a dark color or changes to pink, red, or brown. Document Released: 07/22/2000 Document Revised: 10/17/2011 Document Reviewed: 03/10/2008 [employer] Patient Information 2014 [place], [place].  _______________________________________________________________________  Incentive Spirometer  An incentive spirometer is a tool that can help keep your lungs clear and active. This tool measures how well you are filling your lungs with each breath. Taking long deep breaths may help reverse or decrease the chance of developing breathing (pulmonary) problems (especially infection) following: A long period of time when you are unable to move or be active. BEFORE THE PROCEDURE  If the spirometer includes an indicator to show your best effort, your nurse or respiratory therapist will set it to a desired goal. If possible, sit up straight or lean slightly forward. Try not to slouch. Hold the incentive spirometer in an upright position. INSTRUCTIONS FOR USE  Sit on the edge of your bed if possible, or sit up as far as you can in bed or on a chair. Hold the incentive  spirometer in an upright position. Breathe out normally. Place the mouthpiece in your mouth and seal your lips tightly around it. Breathe in slowly and as deeply as possible, raising the piston or the ball toward the top of the column. Hold your breath for 3-5 seconds or for as long as possible. Allow the piston or ball to fall to the bottom of the column. Remove the mouthpiece from your mouth and breathe out normally. Rest for a few seconds and repeat Steps 1 through 7 at least 10 times every 1-2 hours when you are awake. Take your time and take a few normal breaths between deep breaths.  The spirometer may include an indicator to show your best effort. Use the indicator as a goal to work toward during each repetition. After each set of 10 deep breaths, practice coughing to be sure your lungs are clear. If you have an incision (the cut made at the time of surgery), support your incision when coughing by placing a pillow or rolled up towels firmly against it. Once you are able to get out of bed, walk around indoors and cough well. You may stop using the incentive spirometer when instructed by your caregiver.  RISKS AND COMPLICATIONS Take your time so you do not get dizzy or light-headed. If you are in pain, you may need to take or ask for pain medication before doing incentive spirometry. It is harder to take a deep breath if you are having pain. AFTER USE Rest and breathe slowly and easily. It can be helpful to keep track of a log of your progress. Your caregiver can provide you with a simple table to help with this. If you are using the spirometer at home, follow these instructions: SEEK MEDICAL CARE IF:  You are having difficultly using the spirometer. You have trouble using the spirometer as often as instructed. Your pain medication is not giving enough relief while using the spirometer. You develop fever of 100.5 F (38.1 C) or higher. SEEK IMMEDIATE MEDICAL CARE IF:  You cough up bloody  sputum that had not been present before. You develop fever of 102 F (38.9 C) or greater. You develop worsening pain at or near the incision site. MAKE SURE YOU:  Understand these instructions. Will watch your condition. Will get help right away if you are not doing well or get worse. Document Released: 12/05/2006 Document Revised: 10/17/2011 Document Reviewed: 02/05/2007 [employer] Patient Information 2014 [place], [place].   ________________________________________________________________________

## 2023-12-20 ENCOUNTER — Other Ambulatory Visit (HOSPITAL_COMMUNITY): Payer: Self-pay

## 2023-12-20 ENCOUNTER — Other Ambulatory Visit: Payer: Self-pay

## 2023-12-20 ENCOUNTER — Encounter (HOSPITAL_COMMUNITY)
Admission: RE | Admit: 2023-12-20 | Discharge: 2023-12-20 | Disposition: A | Discharge status: Home / Self Care | Source: Ambulatory Visit | Attending: Orthopedic Surgery | Admitting: Orthopedic Surgery

## 2023-12-20 ENCOUNTER — Encounter (HOSPITAL_COMMUNITY): Payer: Self-pay

## 2023-12-20 VITALS — BP 113/68 | HR 68 | Temp 98.1°F | Resp 16 | Ht 62.0 in | Wt 156.0 lb

## 2023-12-20 DIAGNOSIS — Z01812 Encounter for preprocedural laboratory examination: Secondary | ICD-10-CM | POA: Diagnosis present

## 2023-12-20 DIAGNOSIS — M1611 Unilateral primary osteoarthritis, right hip: Secondary | ICD-10-CM | POA: Diagnosis not present

## 2023-12-20 DIAGNOSIS — Z87891 Personal history of nicotine dependence: Secondary | ICD-10-CM | POA: Diagnosis not present

## 2023-12-20 DIAGNOSIS — E1122 Type 2 diabetes mellitus with diabetic chronic kidney disease: Secondary | ICD-10-CM | POA: Diagnosis not present

## 2023-12-20 DIAGNOSIS — Z7985 Long-term (current) use of injectable non-insulin antidiabetic drugs: Secondary | ICD-10-CM | POA: Insufficient documentation

## 2023-12-20 DIAGNOSIS — Z955 Presence of coronary angioplasty implant and graft: Secondary | ICD-10-CM | POA: Diagnosis not present

## 2023-12-20 DIAGNOSIS — I129 Hypertensive chronic kidney disease with stage 1 through stage 4 chronic kidney disease, or unspecified chronic kidney disease: Secondary | ICD-10-CM | POA: Diagnosis not present

## 2023-12-20 DIAGNOSIS — G4733 Obstructive sleep apnea (adult) (pediatric): Secondary | ICD-10-CM | POA: Insufficient documentation

## 2023-12-20 DIAGNOSIS — I2510 Atherosclerotic heart disease of native coronary artery without angina pectoris: Secondary | ICD-10-CM | POA: Diagnosis not present

## 2023-12-20 DIAGNOSIS — Z01818 Encounter for other preprocedural examination: Secondary | ICD-10-CM

## 2023-12-20 DIAGNOSIS — N184 Chronic kidney disease, stage 4 (severe): Secondary | ICD-10-CM | POA: Diagnosis not present

## 2023-12-20 DIAGNOSIS — Z7984 Long term (current) use of oral hypoglycemic drugs: Secondary | ICD-10-CM | POA: Diagnosis not present

## 2023-12-20 DIAGNOSIS — E119 Type 2 diabetes mellitus without complications: Secondary | ICD-10-CM

## 2023-12-20 DIAGNOSIS — Z79899 Other long term (current) drug therapy: Secondary | ICD-10-CM | POA: Insufficient documentation

## 2023-12-20 DIAGNOSIS — J449 Chronic obstructive pulmonary disease, unspecified: Secondary | ICD-10-CM | POA: Insufficient documentation

## 2023-12-20 HISTORY — DX: Chronic obstructive pulmonary disease, unspecified: J44.9

## 2023-12-20 LAB — CBC
HCT: 37 % (ref 36.0–46.0)
Hemoglobin: 11.4 g/dL — ABNORMAL LOW (ref 12.0–15.0)
MCH: 24.8 pg — ABNORMAL LOW (ref 26.0–34.0)
MCHC: 30.8 g/dL (ref 30.0–36.0)
MCV: 80.6 fL (ref 80.0–100.0)
Platelets: 188 10*3/uL (ref 150–400)
RBC: 4.59 MIL/uL (ref 3.87–5.11)
RDW: 15.2 % (ref 11.5–15.5)
WBC: 5.7 10*3/uL (ref 4.0–10.5)
nRBC: 0 % (ref 0.0–0.2)

## 2023-12-20 LAB — BASIC METABOLIC PANEL WITH GFR
Anion gap: 10 (ref 5–15)
BUN: 43 mg/dL — ABNORMAL HIGH (ref 8–23)
CO2: 24 mmol/L (ref 22–32)
Calcium: 9 mg/dL (ref 8.9–10.3)
Chloride: 103 mmol/L (ref 98–111)
Creatinine, Ser: 1.91 mg/dL — ABNORMAL HIGH (ref 0.44–1.00)
GFR, Estimated: 28 mL/min — ABNORMAL LOW (ref >60)
Glucose, Bld: 138 mg/dL — ABNORMAL HIGH (ref 70–99)
Potassium: 4.1 mmol/L (ref 3.5–5.1)
Sodium: 137 mmol/L (ref 135–145)

## 2023-12-20 LAB — TYPE AND SCREEN
ABO/RH(D): A POS
Antibody Screen: NEGATIVE

## 2023-12-20 LAB — SURGICAL PCR SCREEN
MRSA, PCR: NEGATIVE
Staphylococcus aureus: NEGATIVE

## 2023-12-20 LAB — HEMOGLOBIN A1C
Hgb A1c MFr Bld: 6.7 % — ABNORMAL HIGH (ref 4.8–5.6)
Mean Plasma Glucose: 146 mg/dL

## 2023-12-20 LAB — GLUCOSE, CAPILLARY: Glucose-Capillary: 159 mg/dL — ABNORMAL HIGH (ref 70–99)

## 2023-12-20 NOTE — Progress Notes (Signed)
 Creatinine 1.91 results routed to Dr. Bernard Brick

## 2023-12-22 NOTE — Progress Notes (Signed)
 Anesthesia Chart Review   Case: [id number] Date/Time: 01/02/24 1600   Procedure: ARTHROPLASTY, HIP, TOTAL, ANTERIOR APPROACH (Right: Hip)   Anesthesia type: Spinal   Diagnosis: Primary osteoarthritis of right hip [M16.11]   Pre-op diagnosis: Right hip osteoarthritis   Location: [facility] ROOM 09 / [facility]   Surgeons: Claiborne Crew, MD       DISCUSSION:68 y.o. former smoker with h/o HTN, COPD, OSA, CKD Stage IV, DM II (A1C 6.7), CAD s/p DES to proximal RCA 2007, cardiac cath in 2015 with patent stent, right hip OA scheduled for above procedure 01/02/2024 with Dr. Claiborne Crew.   Pt last seen by cardiology 11/29/2023.  Blood pressure low at this visit, Lasix  decreased, metoprolol  decreased. Per OV note, "I am giving her clearance to undergo her hip surgery scheduled for May"  Clearance received from PCP which states pt is cleared, creatinine stable at 2.2.   VS: BP 113/68   Pulse 68   Temp 36.7 C (Oral)   Resp 16   Ht 5\' 2"  (1.575 m)   Wt 70.8 kg   SpO2 99%   BMI 28.53 kg/m   PROVIDERS: Rosslyn Coons, MD is PCP   Cardiologist - Magnus Schuller, MD   Pulmonologist- Dr. Gaynell Keeler  LABS: Labs reviewed: Acceptable for surgery. (all labs ordered are listed, but only abnormal results are displayed)  Labs Reviewed  HEMOGLOBIN A1C - Abnormal; Notable for the following components:      Result Value   Hgb A1c MFr Bld 6.7 (*)    All other components within normal limits  BASIC METABOLIC PANEL WITH GFR - Abnormal; Notable for the following components:   Glucose, Bld 138 (*)    BUN 43 (*)    Creatinine, Ser 1.91 (*)    GFR, Estimated 28 (*)    All other components within normal limits  CBC - Abnormal; Notable for the following components:   Hemoglobin 11.4 (*)    MCH 24.8 (*)    All other components within normal limits  GLUCOSE, CAPILLARY - Abnormal; Notable for the following components:   Glucose-Capillary 159 (*)    All other components within normal limits  SURGICAL PCR SCREEN  TYPE  AND SCREEN     IMAGES:   EKG:   CV: Echo 07/24/2023 1. Left ventricular ejection fraction, by estimation, is 60 to 65%. The  left ventricle has normal function. The left ventricle has no regional  wall motion abnormalities. Left ventricular diastolic parameters are  consistent with Grade I diastolic  dysfunction (impaired relaxation). Elevated left atrial pressure. The  average left ventricular global longitudinal strain is -21.8 %. The global  longitudinal strain is normal.   2. Right ventricular systolic function is normal. The right ventricular  size is normal.   3. The mitral valve is normal in structure. No evidence of mitral valve  regurgitation. No evidence of mitral stenosis.   4. The aortic valve has an indeterminant number of cusps. Aortic valve  regurgitation is mild. No aortic stenosis is present.   5. The inferior vena cava is normal in size with greater than 50%  respiratory variability, suggesting right atrial pressure of 3 mmHg.   Past Medical History:  Diagnosis Date   Acute bronchitis    Anemia    hx of anemia   Anginal pain (HCC)    Arthritis    osteo   Asthma    Chronic kidney disease (CKD), stage IV (severe) (HCC)    COPD (chronic obstructive pulmonary disease) (HCC)  Coronary atherosclerosis    Cough    GERD (gastroesophageal reflux disease)    History of colon polyps    History of migraine    Hyperlipidemia    Hypertension    Obstructive sleep apnea (adult) (pediatric)    NO CPAP    Peripheral neuropathy    Type II or unspecified type diabetes mellitus without mention of complication, not stated as uncontrolled    type 2   Unspecified hypothyroidism     Past Surgical History:  Procedure Laterality Date   ANTERIOR FUSION CERVICAL SPINE  2009   CARPAL TUNNEL RELEASE Left    COLONOSCOPY     CORONARY STENT INTERVENTION  2007   LEFT HEART CATHETERIZATION WITH CORONARY ANGIOGRAM N/A 05/29/2014   Procedure: LEFT HEART CATHETERIZATION WITH  CORONARY ANGIOGRAM;  Surgeon: Mickiel Albany, MD;  Location: [facility] CATH LAB;  Service: Cardiovascular;  Laterality: N/A;   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   Procedure: TOTAL KNEE ARTHROPLASTY;  Surgeon: Orvan Blanch, MD;  Location: [facility];  Service: Orthopedics;  Laterality: Left;  3 hrs   TOTAL KNEE ARTHROPLASTY Right 10/01/2021   Procedure: TOTAL KNEE ARTHROPLASTY;  Surgeon: Orvan Blanch, MD;  Location: [facility];  Service: Orthopedics;  Laterality: Right;   TUBAL LIGATION  1990   WISDOM TOOTH EXTRACTION      MEDICATIONS:  albuterol  (PROVENTIL  HFA) 108 (90 Base) MCG/ACT inhaler   Ascorbic Acid  (VITAMIN C PO)   BIOTIN PO   budesonide -formoterol  (SYMBICORT ) 160-4.5 MCG/ACT inhaler   Carboxymeth-Glyc-Polysorb PF (REFRESH OPTIVE MEGA-3) 0.5-1-0.5 % SOLN   clotrimazole  (MYCELEX ) 10 MG troche   colchicine  0.6 MG tablet   Cyanocobalamin  (B-12 PO)   Dexlansoprazole 30 MG capsule DR   diazepam  (VALIUM ) 5 MG tablet   diclofenac (VOLTAREN) 75 MG EC tablet   ezetimibe (ZETIA) 10 MG tablet   Finerenone  (KERENDIA ) 10 MG TABS   fluticasone  (FLONASE ) 50 MCG/ACT nasal spray   furosemide  (LASIX ) 40 MG tablet   hydrocortisone  2.5 % cream   ipratropium-albuterol  (DUONEB) 0.5-2.5 (3) MG/3ML SOLN   isosorbide  mononitrate (IMDUR ) 30 MG 24 hr tablet   JARDIANCE  10 MG TABS tablet   Lancets (ONETOUCH DELICA PLUS LANCET33G) MISC   levothyroxine  (SYNTHROID ) 150 MCG tablet   meclizine (ANTIVERT) 25 MG tablet   Mepolizumab  (NUCALA ) 100 MG/ML SOAJ   metoprolol  succinate (TOPROL  XL) 25 MG 24 hr tablet   Multiple Vitamin (MULTIVITAMIN WITH MINERALS) TABS tablet   nitroGLYCERIN  (NITROLINGUAL ) 0.4 MG/SPRAY spray   NOVOFINE PLUS PEN NEEDLE 32G X 4 MM MISC   ONE TOUCH ULTRA TEST test strip   potassium chloride  (KLOR-CON  M) 10 MEQ tablet   predniSONE  (DELTASONE ) 20 MG tablet   pregabalin  (LYRICA ) 100 MG capsule   Pyridoxine  HCl (B-6 PO)   ranolazine  (RANEXA ) 500 MG 12 hr tablet   Respiratory Therapy  Supplies (FLUTTER) DEVI   rosuvastatin  (CRESTOR ) 20 MG tablet   tirzepatide (MOUNJARO) 5 MG/0.5ML Pen   triamterene -hydrochlorothiazide  (MAXZIDE -25) 37.5-25 MG tablet   No current facility-administered medications for this encounter.    Chick Cotton Ward, PA-C [facility] [phone]

## 2023-12-22 NOTE — Anesthesia Preprocedure Evaluation (Addendum)
 Anesthesia Evaluation  Patient identified by MRN, date of birth, ID band Patient awake    Reviewed: Allergy  & Precautions, NPO status , Patient's Chart, lab work & pertinent test results, reviewed documented beta blocker date and time   History of Anesthesia Complications Negative for: history of anesthetic complications  Airway Mallampati: I  TM Distance: >3 FB Neck ROM: Full    Dental  (+) Dental Advisory Given   Pulmonary sleep apnea (does not use CPAP) , COPD,  COPD inhaler, former smoker   breath sounds clear to auscultation       Cardiovascular hypertension, Pt. on medications and Pt. on home beta blockers (-) angina + CAD and + Cardiac Stents (RCA 2007)   Rhythm:Regular Rate:Normal  07/2023 ECHO: EF 60 to 65%.  1. The LV has normal function, no regional wall motion abnormalities. Grade I diastolic  dysfunction (impaired relaxation). Elevated left atrial pressure. The average left ventricular global longitudinal strain is -21.8 %. The global longitudinal strain is normal.   2. RVF is normal. The right ventricular size is normal.   3. The mitral valve is normal in structure. No evidence of mitral valve regurgitation. No evidence of mitral stenosis.   4. The aortic valve has an indeterminant number of cusps. Aortic valve regurgitation is mild. No aortic stenosis is present.     Neuro/Psych    GI/Hepatic ,GERD  Controlled,,  Endo/Other  diabetes (glu 94), Insulin  DependentHypothyroidism  Mounjaro: 2 weeks ago  Renal/GU Renal InsufficiencyRenal disease     Musculoskeletal   Abdominal   Peds  Hematology   Anesthesia Other Findings   Reproductive/Obstetrics                             Anesthesia Physical Anesthesia Plan  ASA: 3  Anesthesia Plan: Spinal   Post-op Pain Management: Tylenol  PO (pre-op)*   Induction:   PONV Risk Score and Plan: 2 and Treatment may vary due to age or  medical condition  Airway Management Planned: Natural Airway and Simple Face Mask  Additional Equipment: None  Intra-op Plan:   Post-operative Plan:   Informed Consent: I have reviewed the patients History and Physical, chart, labs and discussed the procedure including the risks, benefits and alternatives for the proposed anesthesia with the patient or authorized representative who has indicated his/her understanding and acceptance.     Dental advisory given  Plan Discussed with: CRNA and Surgeon  Anesthesia Plan Comments: (See PAT note 12/20/2023)       Anesthesia Quick Evaluation

## 2023-12-27 ENCOUNTER — Other Ambulatory Visit: Payer: Self-pay

## 2023-12-27 MED ORDER — RANOLAZINE ER 500 MG PO TB12: 500.0000 mg | ORAL_TABLET | Freq: Two times a day (BID) | ORAL | 3 refills | Status: AC

## 2023-12-31 NOTE — H&P (Signed)
 TOTAL HIP ADMISSION H&P  Patient is admitted for right total hip arthroplasty.  Therapy Plans: HEP Disposition: Home with twin sister (lives together) Planned DVT Prophylaxis: aspirin  81mg  BID DME needed: none PCP: Dr. Jesse Moritz - clearance received Cardio: Dr. Loetta Ringer - clearance received TXA: IV Allergies: sulfa - hives, metformin  - GI upset, levaquin - n/v, codeine - confusion, allopurinol - rash Anesthesia Concerns: none BMI: 28.5 Last HgbA1c: 5.6%   Other: - no hx of VTE or cancer - hx of bilateral TKAs - did well but had concerns about pain with right TKA - will try oxycodone  - oxycodone , robaxin , tylenol  (send phenergan ) - **If you can't reach her sister > go out to waiting room ** [phone]  Subjective:  Chief Complaint: right hip pain  HPI: [name], 68 y.o. female, has a history of pain and functional disability in the right hip(s) due to arthritis and patient has failed non-surgical conservative treatments for greater than 12 weeks to include NSAID's and/or analgesics and activity modification.  Onset of symptoms was gradual starting 2 years ago with gradually worsening course since that time.The patient noted no past surgery on the right hip(s).  Patient currently rates pain in the right hip at 8 out of 10 with activity. Patient has worsening of pain with activity and weight bearing, pain that interfers with activities of daily living, and pain with passive range of motion. Patient has evidence of joint space narrowing by imaging studies. This condition presents safety issues increasing the risk of falls.  There is no current active infection.  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   Allergic reaction 04/26/2020   Acute renal failure superimposed on stage 3 chronic kidney disease (HCC) 04/26/2020   Stomatitis 04/26/2020   Allergic  reaction caused by a drug 04/26/2020   Rash    Left knee DJD 02/05/2020   Acute bronchitis 08/10/2014   Unstable angina (HCC) 05/29/2014   Abnormal nuclear stress test 05/29/2014   Chest pain 05/06/2014   Exertional shortness of breath 05/06/2014   Mixed hyperlipidemia 05/06/2014   Hyperlipidemia with target LDL less than 70 08/05/2013   HTN (hypertension) 08/05/2013   Chest wall pain 08/05/2013   SINUSITIS, ACUTE 04/07/2009   HYPOTHYROIDISM 12/10/2007   Type 2 diabetes mellitus with hemoglobin A1c goal of less than 7.0% (HCC) 05/28/2007   Obstructive sleep apnea 05/28/2007   Coronary atherosclerosis 05/28/2007   Seasonal and perennial allergic rhinitis 05/28/2007   COUGH, CHRONIC 05/28/2007   Past Medical History:  Diagnosis Date   Acute bronchitis    Anemia    hx of anemia   Anginal pain (HCC)    Arthritis    osteo   Asthma    Chronic kidney disease (CKD), stage IV (severe) (HCC)    COPD (chronic obstructive pulmonary disease) (HCC)    Coronary atherosclerosis    Cough    GERD (gastroesophageal reflux disease)    History of colon polyps    History of migraine    Hyperlipidemia    Hypertension    Obstructive sleep apnea (adult) (pediatric)    NO CPAP    Peripheral neuropathy    Type II or unspecified type diabetes mellitus without mention of complication, not stated as uncontrolled    type 2   Unspecified hypothyroidism     Past Surgical History:  Procedure Laterality Date   ANTERIOR  FUSION CERVICAL SPINE  2009   CARPAL TUNNEL RELEASE Left    COLONOSCOPY     CORONARY STENT INTERVENTION  2007   LEFT HEART CATHETERIZATION WITH CORONARY ANGIOGRAM N/A 05/29/2014   Procedure: LEFT HEART CATHETERIZATION WITH CORONARY ANGIOGRAM;  Surgeon: Mickiel Albany, MD;  Location: [facility] C[facility] LAB;  Service: Cardiovascular;  Laterality: N/A;   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   Procedure: TOTAL KNEE ARTHROPLASTY;  Surgeon: Orvan Blanch, MD;  Location: [facility];  Service:  Orthopedics;  Laterality: Left;  3 hrs   TOTAL KNEE ARTHROPLASTY Right 10/01/2021   Procedure: TOTAL KNEE ARTHROPLASTY;  Surgeon: Orvan Blanch, MD;  Location: [facility];  Service: Orthopedics;  Laterality: Right;   TUBAL LIGATION  1990   WISDOM TOOTH EXTRACTION      No current facility-administered medications for this encounter.   Current Outpatient Medications  Medication Sig Dispense Refill Last Dose/Taking   albuterol  (PROVENTIL  HFA) 108 (90 Base) MCG/ACT inhaler Inhale 2 puffs into the lungs every 6 (six) hours as needed for wheezing or shortness of breath. For shortness of breath and wheezing 18 each 2 Taking As Needed   Ascorbic Acid  (VITAMIN C PO) Take 1 tablet by mouth daily.   Taking   BIOTIN PO Take 500 mg by mouth daily.   Taking   budesonide -formoterol  (SYMBICORT ) 160-4.5 MCG/ACT inhaler Inhale 2 puffs into the lungs 2 (two) times daily. 1 each 6 Taking   Carboxymeth-Glyc-Polysorb PF (REFRESH OPTIVE MEGA-3) 0.5-1-0.5 % SOLN Place 1 drop into both eyes daily in the afternoon.   Taking   Cyanocobalamin  (B-12 PO) Take 1,000 mcg by mouth daily.   Taking   Dexlansoprazole 30 MG capsule DR Take 30 mg by mouth daily as needed (acid reflux).   Taking As Needed   diazepam  (VALIUM ) 5 MG tablet Take 5 mg by mouth daily as needed for muscle spasms (cramps).   Taking As Needed   diclofenac (VOLTAREN) 75 MG EC tablet Take 75 mg by mouth daily.   Taking   ezetimibe (ZETIA) 10 MG tablet Take 10 mg by mouth daily.   Taking   Finerenone  (KERENDIA ) 10 MG TABS Take 10 mg by mouth daily.   Taking   fluticasone  (FLONASE ) 50 MCG/ACT nasal spray Place 2 sprays into both nostrils daily. (Patient taking differently: Place 2 sprays into both nostrils daily as needed for allergies.) 18.2 mL 11 Taking Differently   furosemide  (LASIX ) 40 MG tablet Take 1 tablet (40 mg total) by mouth daily. In the morning (Patient taking differently: Take 20 mg by mouth 2 (two) times daily.) 90 tablet 3 Taking Differently    isosorbide  mononitrate (IMDUR ) 30 MG 24 hr tablet Take 1 tablet (30 mg total) by mouth daily. 30 tablet 11 Taking   JARDIANCE  10 MG TABS tablet Take 10 mg by mouth daily.   Taking   levothyroxine  (SYNTHROID ) 150 MCG tablet Take 150 mcg by mouth daily before breakfast.   Taking   meclizine (ANTIVERT) 25 MG tablet Take 25 mg by mouth 3 (three) times daily as needed for dizziness or nausea.   Taking As Needed   Mepolizumab  (NUCALA ) 100 MG/ML SOAJ Inject 1 mL (100 mg total) into the skin every 28 (twenty-eight) days. 1 mL 5 Taking   metoprolol  succinate (TOPROL  XL) 25 MG 24 hr tablet Take 1 tablet (25 mg total) by mouth daily. (Patient taking differently: Take 12.5 mg by mouth daily.) 90 tablet 3 Taking Differently   Multiple Vitamin (MULTIVITAMIN WITH MINERALS) TABS tablet  Take 1 tablet by mouth daily.   Taking   nitroGLYCERIN  (NITROLINGUAL ) 0.4 MG/SPRAY spray Place 1 spray under the tongue every 5 (five) minutes x 3 doses as needed for chest pain. 12 g 5 Taking As Needed   potassium chloride  (KLOR-CON  M) 10 MEQ tablet Take 1 tablet (10 mEq total) by mouth daily. 90 tablet 3 Taking   pregabalin  (LYRICA ) 100 MG capsule Take 100 mg by mouth 2 (two) times daily.   Taking   Pyridoxine  HCl (B-6 PO) Take 1 tablet by mouth daily.   Taking   rosuvastatin  (CRESTOR ) 20 MG tablet TAKE 1 TABLET BY MOUTH DAILY 90 tablet 3 Taking   tirzepatide (MOUNJARO) 5 MG/0.5ML Pen Inject 5 mg into the skin once a week.   Taking   triamterene -hydrochlorothiazide  (MAXZIDE -25) 37.5-25 MG tablet TAKE 1 TABLET BY MOUTH DAILY 90 tablet 3 Taking   clotrimazole  (MYCELEX ) 10 MG troche Take 1 tablet (10 mg total) by mouth 5 (five) times daily. (Patient not taking: Reported on 12/14/2023) 50 Troche 0 Not Taking   colchicine  0.6 MG tablet Take 0.6 mg by mouth daily as needed (gout flare).      hydrocortisone  2.5 % cream Apply topically 2 (two) times daily. Apply as needed two times daily (Patient not taking: Reported on 12/14/2023) 30 g 0 Not  Taking   ipratropium-albuterol  (DUONEB) 0.5-2.5 (3) MG/3ML SOLN Take 3 mLs by nebulization every 4 (four) hours as needed. (Patient not taking: Reported on 12/14/2023) 360 mL 11 Not Taking   Lancets (ONETOUCH DELICA PLUS LANCET33G) MISC Apply 1 each topically 4 (four) times daily.      NOVOFINE PLUS PEN NEEDLE 32G X 4 MM MISC Inject into the skin.      ONE TOUCH ULTRA TEST test strip Use as directed      predniSONE  (DELTASONE ) 20 MG tablet Take 1 tablet (20 mg total) by mouth daily with breakfast. (Patient not taking: Reported on 12/14/2023) 7 tablet 0 Not Taking   ranolazine  (RANEXA ) 500 MG 12 hr tablet Take 1 tablet (500 mg total) by mouth 2 (two) times daily. 180 tablet 3    Respiratory Therapy Supplies (FLUTTER) DEVI 1 puff by Does not apply route daily. 1 each 0    Allergies  Allergen Reactions   Allopurinol Rash    Toxic epidermal necrolysis    Codeine Other (See Comments)    REACTION: hallucinations, funny in the head    Levaquin [Levofloxacin] Nausea And Vomiting   Sulfa Antibiotics Hives   Sulfonamide Derivatives Hives    Social History   Tobacco Use   Smoking status: Former    Current packs/day: 0.00    Average packs/day: 0.5 packs/day for 25.0 years (12.5 ttl pk-yrs)    Types: Cigarettes    Start date: 08/08/1978    Quit date: 08/09/2003    Years since quitting: 20.4   Smokeless tobacco: Never  Substance Use Topics   Alcohol  use: Not Currently    Family History  Problem Relation Age of Onset   Hypertension Mother    Heart disease Mother    Stroke Mother    Prostate cancer Father    Heart disease Brother    Heart attack Sister      Review of Systems  Constitutional:  Negative for chills and fever.  Respiratory:  Negative for cough and shortness of breath.   Cardiovascular:  Negative for chest pain.  Gastrointestinal:  Negative for nausea and vomiting.  Musculoskeletal:  Positive for arthralgias.  Objective:  Physical Exam Well nourished and well  developed. General: Alert and oriented x3, cooperative and pleasant, no acute distress.  Musculoskeletal: Right Hip: Reproducible groin pain with all attempts at passive ROM Limited internal/external rotation  Left Hip: Full ROM without discomfort  Bilateral knees: Well healed arthroplasty scars No signs of infection Generally full ROM  Vital signs in last 24 hours:    Labs:   Estimated body mass index is 28.53 kg/m as calculated from the following:   Height as of 12/20/23: 5\' 2"  (1.575 m).   Weight as of 12/20/23: 70.8 kg.   Imaging Review Plain radiographs demonstrate severe degenerative joint disease of the right hip(s). The bone quality appears to be adequate for age and reported activity level.      Assessment/Plan:  End stage arthritis, right hip(s)  The patient history, physical examination, clinical judgement of the provider and imaging studies are consistent with end stage degenerative joint disease of the right hip(s) and total hip arthroplasty is deemed medically necessary. The treatment options including medical management, injection therapy, arthroscopy and arthroplasty were discussed at length. The risks and benefits of total hip arthroplasty were presented and reviewed. The risks due to aseptic loosening, infection, stiffness, dislocation/subluxation,  thromboembolic complications and other imponderables were discussed.  The patient acknowledged the explanation, agreed to proceed with the plan and consent was signed. Patient is being admitted for inpatient treatment for surgery, pain control, PT, OT, prophylactic antibiotics, VTE prophylaxis, progressive ambulation and ADL's and discharge planning.The patient is planning to be discharged home.   Kim Pen, PA-C Orthopedic Surgery [facility] [place] [phone]

## 2024-01-02 ENCOUNTER — Encounter (HOSPITAL_COMMUNITY): Payer: Self-pay | Admitting: Orthopedic Surgery

## 2024-01-02 ENCOUNTER — Ambulatory Visit (HOSPITAL_COMMUNITY): Payer: Self-pay | Admitting: Physician Assistant

## 2024-01-02 ENCOUNTER — Other Ambulatory Visit: Payer: Self-pay

## 2024-01-02 ENCOUNTER — Ambulatory Visit (HOSPITAL_COMMUNITY): Admitting: Anesthesiology

## 2024-01-02 ENCOUNTER — Ambulatory Visit (HOSPITAL_COMMUNITY)

## 2024-01-02 ENCOUNTER — Encounter (HOSPITAL_COMMUNITY)
Admission: RE | Disposition: A | Discharge status: Home / Self Care | Payer: Self-pay | Source: Ambulatory Visit | Attending: Orthopedic Surgery

## 2024-01-02 ENCOUNTER — Observation Stay (HOSPITAL_COMMUNITY)
Admission: RE | Admit: 2024-01-02 | Discharge: 2024-01-03 | Disposition: A | Discharge status: Home / Self Care | Source: Ambulatory Visit | Attending: Orthopedic Surgery | Admitting: Orthopedic Surgery

## 2024-01-02 DIAGNOSIS — J449 Chronic obstructive pulmonary disease, unspecified: Secondary | ICD-10-CM | POA: Insufficient documentation

## 2024-01-02 DIAGNOSIS — I2510 Atherosclerotic heart disease of native coronary artery without angina pectoris: Secondary | ICD-10-CM | POA: Insufficient documentation

## 2024-01-02 DIAGNOSIS — M1611 Unilateral primary osteoarthritis, right hip: Secondary | ICD-10-CM | POA: Diagnosis present

## 2024-01-02 DIAGNOSIS — Z955 Presence of coronary angioplasty implant and graft: Secondary | ICD-10-CM | POA: Insufficient documentation

## 2024-01-02 DIAGNOSIS — E039 Hypothyroidism, unspecified: Secondary | ICD-10-CM | POA: Diagnosis not present

## 2024-01-02 DIAGNOSIS — I129 Hypertensive chronic kidney disease with stage 1 through stage 4 chronic kidney disease, or unspecified chronic kidney disease: Secondary | ICD-10-CM | POA: Diagnosis not present

## 2024-01-02 DIAGNOSIS — Z79899 Other long term (current) drug therapy: Secondary | ICD-10-CM | POA: Diagnosis not present

## 2024-01-02 DIAGNOSIS — Z96653 Presence of artificial knee joint, bilateral: Secondary | ICD-10-CM | POA: Diagnosis not present

## 2024-01-02 DIAGNOSIS — N183 Chronic kidney disease, stage 3 unspecified: Secondary | ICD-10-CM

## 2024-01-02 DIAGNOSIS — E1122 Type 2 diabetes mellitus with diabetic chronic kidney disease: Secondary | ICD-10-CM | POA: Insufficient documentation

## 2024-01-02 DIAGNOSIS — Z87891 Personal history of nicotine dependence: Secondary | ICD-10-CM | POA: Insufficient documentation

## 2024-01-02 DIAGNOSIS — Z96641 Presence of right artificial hip joint: Secondary | ICD-10-CM

## 2024-01-02 DIAGNOSIS — N184 Chronic kidney disease, stage 4 (severe): Secondary | ICD-10-CM | POA: Insufficient documentation

## 2024-01-02 DIAGNOSIS — E119 Type 2 diabetes mellitus without complications: Secondary | ICD-10-CM

## 2024-01-02 HISTORY — PX: TOTAL HIP ARTHROPLASTY: SHX124

## 2024-01-02 LAB — ABO/RH: ABO/RH(D): A POS

## 2024-01-02 LAB — GLUCOSE, CAPILLARY
Glucose-Capillary: 94 mg/dL (ref 70–99)
Glucose-Capillary: 110 mg/dL — ABNORMAL HIGH (ref 70–99)

## 2024-01-02 SURGERY — ARTHROPLASTY, HIP, TOTAL, ANTERIOR APPROACH
Anesthesia: Spinal | Site: Hip | Laterality: Right

## 2024-01-02 MED ORDER — ISOSORBIDE MONONITRATE ER 30 MG PO TB24
30.0000 mg | ORAL_TABLET | Freq: Every day | ORAL | Status: DC
  Administered 2024-01-03: 30 mg via ORAL
  Filled 2024-01-02: qty 1

## 2024-01-02 MED ORDER — MECLIZINE HCL 25 MG PO TABS: 25.0000 mg | ORAL_TABLET | Freq: Three times a day (TID) | ORAL | Status: DC | PRN

## 2024-01-02 MED ORDER — ONDANSETRON HCL 4 MG/2ML IJ SOLN
INTRAMUSCULAR | Status: DC | PRN
  Administered 2024-01-02: 4 mg via INTRAVENOUS

## 2024-01-02 MED ORDER — FLUTICASONE PROPIONATE 50 MCG/ACT NA SUSP: 2.0000 | Freq: Every day | NASAL | Status: DC | PRN

## 2024-01-02 MED ORDER — METOCLOPRAMIDE HCL 5 MG PO TABS: 5.0000 mg | ORAL_TABLET | Freq: Three times a day (TID) | ORAL | Status: DC | PRN

## 2024-01-02 MED ORDER — OXYCODONE HCL 5 MG PO TABS
10.0000 mg | ORAL_TABLET | ORAL | Status: DC | PRN
  Administered 2024-01-02: 10 mg via ORAL
  Filled 2024-01-02: qty 2

## 2024-01-02 MED ORDER — ALUM & MAG HYDROXIDE-SIMETH 200-200-20 MG/5ML PO SUSP: 30.0000 mL | ORAL | Status: DC | PRN

## 2024-01-02 MED ORDER — CEFAZOLIN SODIUM-DEXTROSE 2-4 GM/100ML-% IV SOLN
2.0000 g | Freq: Four times a day (QID) | INTRAVENOUS | Status: AC
  Administered 2024-01-02 – 2024-01-03 (×2): 2 g via INTRAVENOUS
  Filled 2024-01-02 (×2): qty 100

## 2024-01-02 MED ORDER — ASPIRIN 81 MG PO CHEW
81.0000 mg | CHEWABLE_TABLET | Freq: Two times a day (BID) | ORAL | Status: DC
  Administered 2024-01-02 – 2024-01-03 (×2): 81 mg via ORAL
  Filled 2024-01-02 (×2): qty 1

## 2024-01-02 MED ORDER — METHOCARBAMOL 500 MG PO TABS
500.0000 mg | ORAL_TABLET | Freq: Four times a day (QID) | ORAL | Status: DC | PRN
  Administered 2024-01-02 – 2024-01-03 (×2): 500 mg via ORAL
  Filled 2024-01-02 (×2): qty 1

## 2024-01-02 MED ORDER — CEFAZOLIN SODIUM-DEXTROSE 2-4 GM/100ML-% IV SOLN
2.0000 g | INTRAVENOUS | Status: AC
  Administered 2024-01-02: 2 g via INTRAVENOUS
  Filled 2024-01-02: qty 100

## 2024-01-02 MED ORDER — DIPHENHYDRAMINE HCL 12.5 MG/5ML PO ELIX: 12.5000 mg | ORAL_SOLUTION | ORAL | Status: DC | PRN

## 2024-01-02 MED ORDER — STERILE WATER FOR IRRIGATION IR SOLN
Status: DC | PRN
  Administered 2024-01-02: 2000 mL

## 2024-01-02 MED ORDER — OXYCODONE HCL 5 MG/5ML PO SOLN: 5.0000 mg | Freq: Once | ORAL | Status: DC | PRN

## 2024-01-02 MED ORDER — HYDROMORPHONE HCL 1 MG/ML IJ SOLN: 0.2500 mg | INTRAMUSCULAR | Status: DC | PRN

## 2024-01-02 MED ORDER — TRANEXAMIC ACID-NACL 1000-0.7 MG/100ML-% IV SOLN
1000.0000 mg | Freq: Once | INTRAVENOUS | Status: AC
  Administered 2024-01-02: 1000 mg via INTRAVENOUS
  Filled 2024-01-02: qty 100

## 2024-01-02 MED ORDER — CHLORHEXIDINE GLUCONATE 0.12 % MT SOLN
15.0000 mL | Freq: Once | OROMUCOSAL | Status: AC
  Administered 2024-01-02: 15 mL via OROMUCOSAL

## 2024-01-02 MED ORDER — PREGABALIN 100 MG PO CAPS
100.0000 mg | ORAL_CAPSULE | Freq: Two times a day (BID) | ORAL | Status: DC
  Administered 2024-01-02 – 2024-01-03 (×2): 100 mg via ORAL
  Filled 2024-01-02 (×2): qty 1

## 2024-01-02 MED ORDER — ROSUVASTATIN CALCIUM 20 MG PO TABS
20.0000 mg | ORAL_TABLET | Freq: Every day | ORAL | Status: DC
  Administered 2024-01-02: 20 mg via ORAL
  Filled 2024-01-02 (×2): qty 1

## 2024-01-02 MED ORDER — BUPIVACAINE-EPINEPHRINE (PF) 0.25% -1:200000 IJ SOLN
INTRAMUSCULAR | Status: DC | PRN
  Administered 2024-01-02: 30 mL

## 2024-01-02 MED ORDER — BISACODYL 10 MG RE SUPP: 10.0000 mg | Freq: Every day | RECTAL | Status: DC | PRN

## 2024-01-02 MED ORDER — POTASSIUM CHLORIDE CRYS ER 10 MEQ PO TBCR
10.0000 meq | EXTENDED_RELEASE_TABLET | Freq: Every day | ORAL | Status: DC
  Administered 2024-01-02 – 2024-01-03 (×2): 10 meq via ORAL
  Filled 2024-01-02 (×2): qty 1

## 2024-01-02 MED ORDER — ALBUMIN HUMAN 5 % IV SOLN
12.5000 g | Freq: Once | INTRAVENOUS | Status: AC
  Administered 2024-01-02: 12.5 g via INTRAVENOUS

## 2024-01-02 MED ORDER — HYDROMORPHONE HCL 1 MG/ML IJ SOLN: 0.5000 mg | INTRAMUSCULAR | Status: DC | PRN

## 2024-01-02 MED ORDER — EMPAGLIFLOZIN 10 MG PO TABS
10.0000 mg | ORAL_TABLET | Freq: Every day | ORAL | Status: DC
  Administered 2024-01-03: 10 mg via ORAL
  Filled 2024-01-02: qty 1

## 2024-01-02 MED ORDER — TRIAMTERENE-HCTZ 37.5-25 MG PO TABS
1.0000 | ORAL_TABLET | Freq: Every day | ORAL | Status: DC
  Administered 2024-01-03: 1 via ORAL
  Filled 2024-01-02: qty 1

## 2024-01-02 MED ORDER — ACETAMINOPHEN 500 MG PO TABS
1000.0000 mg | ORAL_TABLET | Freq: Once | ORAL | Status: AC
  Administered 2024-01-02: 1000 mg via ORAL
  Filled 2024-01-02: qty 2

## 2024-01-02 MED ORDER — FENTANYL CITRATE (PF) 100 MCG/2ML IJ SOLN
INTRAMUSCULAR | Status: DC | PRN
  Administered 2024-01-02: 50 ug via INTRAVENOUS

## 2024-01-02 MED ORDER — POLYETHYLENE GLYCOL 3350 17 G PO PACK
17.0000 g | PACK | Freq: Two times a day (BID) | ORAL | Status: DC
  Administered 2024-01-02 – 2024-01-03 (×2): 17 g via ORAL
  Filled 2024-01-02 (×2): qty 1

## 2024-01-02 MED ORDER — MIDAZOLAM HCL 2 MG/2ML IJ SOLN: 0.5000 mg | Freq: Once | INTRAMUSCULAR | Status: DC | PRN

## 2024-01-02 MED ORDER — 0.9 % SODIUM CHLORIDE (POUR BTL) OPTIME
TOPICAL | Status: DC | PRN
  Administered 2024-01-02: 1000 mL

## 2024-01-02 MED ORDER — PROPOFOL 500 MG/50ML IV EMUL
INTRAVENOUS | Status: DC | PRN
  Administered 2024-01-02: 50 ug/kg/min via INTRAVENOUS

## 2024-01-02 MED ORDER — EZETIMIBE 10 MG PO TABS
10.0000 mg | ORAL_TABLET | Freq: Every day | ORAL | Status: DC
  Administered 2024-01-02: 10 mg via ORAL
  Filled 2024-01-02 (×2): qty 1

## 2024-01-02 MED ORDER — SENNA 8.6 MG PO TABS
2.0000 | ORAL_TABLET | Freq: Every day | ORAL | Status: DC
  Administered 2024-01-02: 17.2 mg via ORAL
  Filled 2024-01-02: qty 2

## 2024-01-02 MED ORDER — MENTHOL 3 MG MT LOZG: 1.0000 | LOZENGE | OROMUCOSAL | Status: DC | PRN

## 2024-01-02 MED ORDER — FENTANYL CITRATE (PF) 100 MCG/2ML IJ SOLN
INTRAMUSCULAR | Status: AC
  Filled 2024-01-02: qty 2

## 2024-01-02 MED ORDER — ALBUTEROL SULFATE (2.5 MG/3ML) 0.083% IN NEBU
3.0000 mL | INHALATION_SOLUTION | Freq: Four times a day (QID) | RESPIRATORY_TRACT | Status: DC | PRN
Start: 2024-01-02 — End: 2024-01-03

## 2024-01-02 MED ORDER — OXYCODONE HCL 5 MG PO TABS: 5.0000 mg | ORAL_TABLET | Freq: Once | ORAL | Status: DC | PRN

## 2024-01-02 MED ORDER — RANOLAZINE ER 500 MG PO TB12
500.0000 mg | ORAL_TABLET | Freq: Two times a day (BID) | ORAL | Status: DC
  Administered 2024-01-02 – 2024-01-03 (×2): 500 mg via ORAL
  Filled 2024-01-02 (×2): qty 1

## 2024-01-02 MED ORDER — DEXAMETHASONE SODIUM PHOSPHATE 10 MG/ML IJ SOLN
10.0000 mg | Freq: Once | INTRAMUSCULAR | Status: AC
  Administered 2024-01-03: 10 mg via INTRAVENOUS
  Filled 2024-01-02: qty 1

## 2024-01-02 MED ORDER — SODIUM CHLORIDE (PF) 0.9 % IJ SOLN
INTRAMUSCULAR | Status: AC
  Filled 2024-01-02: qty 30

## 2024-01-02 MED ORDER — LACTATED RINGERS IV SOLN: INTRAVENOUS | Status: DC

## 2024-01-02 MED ORDER — SODIUM CHLORIDE (PF) 0.9 % IJ SOLN
INTRAMUSCULAR | Status: DC | PRN
  Administered 2024-01-02: 30 mL

## 2024-01-02 MED ORDER — FUROSEMIDE 20 MG PO TABS
20.0000 mg | ORAL_TABLET | Freq: Two times a day (BID) | ORAL | Status: DC
  Administered 2024-01-03: 20 mg via ORAL
  Filled 2024-01-02: qty 1

## 2024-01-02 MED ORDER — MIDAZOLAM HCL 2 MG/2ML IJ SOLN
INTRAMUSCULAR | Status: AC
  Filled 2024-01-02: qty 2

## 2024-01-02 MED ORDER — METHOCARBAMOL 1000 MG/10ML IJ SOLN: 500.0000 mg | Freq: Four times a day (QID) | INTRAMUSCULAR | Status: DC | PRN

## 2024-01-02 MED ORDER — TRANEXAMIC ACID-NACL 1000-0.7 MG/100ML-% IV SOLN
1000.0000 mg | INTRAVENOUS | Status: AC
  Administered 2024-01-02: 1000 mg via INTRAVENOUS
  Filled 2024-01-02: qty 100

## 2024-01-02 MED ORDER — DEXAMETHASONE SODIUM PHOSPHATE 10 MG/ML IJ SOLN
8.0000 mg | Freq: Once | INTRAMUSCULAR | Status: AC
  Administered 2024-01-02: 8 mg via INTRAVENOUS

## 2024-01-02 MED ORDER — ACETAMINOPHEN 500 MG PO TABS
1000.0000 mg | ORAL_TABLET | Freq: Four times a day (QID) | ORAL | Status: DC
  Administered 2024-01-02 – 2024-01-03 (×2): 1000 mg via ORAL
  Filled 2024-01-02 (×2): qty 2

## 2024-01-02 MED ORDER — FINERENONE 10 MG PO TABS: 10.0000 mg | ORAL_TABLET | Freq: Every day | ORAL | Status: DC

## 2024-01-02 MED ORDER — BUPIVACAINE-EPINEPHRINE (PF) 0.25% -1:200000 IJ SOLN
INTRAMUSCULAR | Status: AC
  Filled 2024-01-02: qty 30

## 2024-01-02 MED ORDER — LACTATED RINGERS IV SOLN: INTRAVENOUS | Status: DC | PRN

## 2024-01-02 MED ORDER — ALBUMIN HUMAN 5 % IV SOLN
INTRAVENOUS | Status: AC
  Filled 2024-01-02: qty 250

## 2024-01-02 MED ORDER — SODIUM CHLORIDE 0.9% FLUSH
3.0000 mL | Freq: Two times a day (BID) | INTRAVENOUS | Status: DC
  Administered 2024-01-02 – 2024-01-03 (×2): 10 mL via INTRAVENOUS

## 2024-01-02 MED ORDER — LEVOTHYROXINE SODIUM 75 MCG PO TABS
150.0000 ug | ORAL_TABLET | Freq: Every day | ORAL | Status: DC
  Administered 2024-01-03: 150 ug via ORAL
  Filled 2024-01-02: qty 2

## 2024-01-02 MED ORDER — METOCLOPRAMIDE HCL 5 MG/ML IJ SOLN: 5.0000 mg | Freq: Three times a day (TID) | INTRAMUSCULAR | Status: DC | PRN

## 2024-01-02 MED ORDER — SODIUM CHLORIDE 0.9% FLUSH: 3.0000 mL | INTRAVENOUS | Status: DC | PRN

## 2024-01-02 MED ORDER — INSULIN ASPART 100 UNIT/ML IJ SOLN: 0.0000 [IU] | INTRAMUSCULAR | Status: DC | PRN

## 2024-01-02 MED ORDER — PHENOL 1.4 % MT LIQD: 1.0000 | OROMUCOSAL | Status: DC | PRN

## 2024-01-02 MED ORDER — POVIDONE-IODINE 10 % EX SWAB
2.0000 | Freq: Once | CUTANEOUS | Status: AC
  Administered 2024-01-02: 2 via TOPICAL

## 2024-01-02 MED ORDER — POLYVINYL ALCOHOL 1.4 % OP SOLN
1.0000 [drp] | Freq: Every day | OPHTHALMIC | Status: DC
  Filled 2024-01-02: qty 15

## 2024-01-02 MED ORDER — ORAL CARE MOUTH RINSE: 15.0000 mL | Freq: Once | OROMUCOSAL | Status: AC

## 2024-01-02 MED ORDER — PHENYLEPHRINE HCL-NACL 20-0.9 MG/250ML-% IV SOLN
INTRAVENOUS | Status: DC | PRN
Start: 2024-01-02 — End: 2024-01-02
  Administered 2024-01-02: 25 ug/min via INTRAVENOUS

## 2024-01-02 MED ORDER — ONDANSETRON HCL 4 MG/2ML IJ SOLN
4.0000 mg | Freq: Four times a day (QID) | INTRAMUSCULAR | Status: DC | PRN
  Administered 2024-01-02: 4 mg via INTRAVENOUS
  Filled 2024-01-02: qty 2

## 2024-01-02 MED ORDER — KETOROLAC TROMETHAMINE 30 MG/ML IJ SOLN
INTRAMUSCULAR | Status: AC
  Filled 2024-01-02: qty 1

## 2024-01-02 MED ORDER — OXYCODONE HCL 5 MG PO TABS
5.0000 mg | ORAL_TABLET | ORAL | Status: DC | PRN
  Administered 2024-01-02 – 2024-01-03 (×2): 10 mg via ORAL
  Filled 2024-01-02 (×2): qty 2

## 2024-01-02 MED ORDER — KETOROLAC TROMETHAMINE 30 MG/ML IJ SOLN
INTRAMUSCULAR | Status: DC | PRN
  Administered 2024-01-02: 30 mg via INTRA_ARTICULAR

## 2024-01-02 MED ORDER — FLUTICASONE FUROATE-VILANTEROL 200-25 MCG/ACT IN AEPB
1.0000 | INHALATION_SPRAY | Freq: Every day | RESPIRATORY_TRACT | Status: DC
  Filled 2024-01-02: qty 28

## 2024-01-02 MED ORDER — MIDAZOLAM HCL 5 MG/5ML IJ SOLN
INTRAMUSCULAR | Status: DC | PRN
  Administered 2024-01-02: 2 mg via INTRAVENOUS

## 2024-01-02 MED ORDER — ONDANSETRON HCL 4 MG PO TABS: 4.0000 mg | ORAL_TABLET | Freq: Four times a day (QID) | ORAL | Status: DC | PRN

## 2024-01-02 MED ORDER — BUPIVACAINE IN DEXTROSE 0.75-8.25 % IT SOLN
INTRATHECAL | Status: DC | PRN
Start: 2024-01-02 — End: 2024-01-02
  Administered 2024-01-02: 12 mg via INTRATHECAL

## 2024-01-02 MED ORDER — METOPROLOL SUCCINATE ER 25 MG PO TB24
12.5000 mg | ORAL_TABLET | Freq: Every day | ORAL | Status: DC
  Filled 2024-01-02: qty 1

## 2024-01-02 SURGICAL SUPPLY — 38 items
BAG COUNTER SPONGE SURGICOUNT (BAG) IMPLANT
BAG ZIPLOCK 12X15 (MISCELLANEOUS) IMPLANT
BLADE SAG 18X100X1.27 (BLADE) ×1 IMPLANT
COVER PERINEAL POST (MISCELLANEOUS) ×1 IMPLANT
COVER SURGICAL LIGHT HANDLE (MISCELLANEOUS) ×1 IMPLANT
CUP ACETBLR 52 OD PINNACLE (Hips) IMPLANT
DERMABOND ADVANCED .7 DNX12 (GAUZE/BANDAGES/DRESSINGS) ×1 IMPLANT
DRAPE FOOT SWITCH (DRAPES) ×1 IMPLANT
DRAPE STERI IOBAN 125X83 (DRAPES) ×1 IMPLANT
DRAPE U-SHAPE 47X51 STRL (DRAPES) ×2 IMPLANT
DRESSING AQUACEL AG SP 3.5X10 (GAUZE/BANDAGES/DRESSINGS) ×1 IMPLANT
DURAPREP 26ML APPLICATOR (WOUND CARE) ×1 IMPLANT
ELECT REM PT RETURN 15FT ADLT (MISCELLANEOUS) ×1 IMPLANT
GLOVE BIO SURGEON STRL SZ 6 (GLOVE) ×1 IMPLANT
GLOVE BIOGEL PI IND STRL 6.5 (GLOVE) ×1 IMPLANT
GLOVE BIOGEL PI IND STRL 7.5 (GLOVE) ×1 IMPLANT
GLOVE ORTHO TXT STRL SZ7.5 (GLOVE) ×2 IMPLANT
GOWN STRL REUS W/ TWL LRG LVL3 (GOWN DISPOSABLE) ×2 IMPLANT
HEAD CERAMIC DELTA 36 PLUS 1.5 (Hips) IMPLANT
HOLDER FOLEY CATH W/STRAP (MISCELLANEOUS) ×1 IMPLANT
KIT TURNOVER KIT A (KITS) IMPLANT
LINER NEUTRAL 52X36MM PLUS 4 (Liner) IMPLANT
MANIFOLD NEPTUNE II (INSTRUMENTS) ×1 IMPLANT
NDL SAFETY ECLIPSE 18X1.5 (NEEDLE) IMPLANT
PACK ANTERIOR HIP CUSTOM (KITS) ×1 IMPLANT
PENCIL SMOKE EVACUATOR (MISCELLANEOUS) ×1 IMPLANT
SCREW 6.5MMX30MM (Screw) IMPLANT
STEM FEM ACTIS HIGH SZ3 (Stem) IMPLANT
SUT MNCRL AB 4-0 PS2 18 (SUTURE) ×1 IMPLANT
SUT VIC AB 1 CT1 36 (SUTURE) ×3 IMPLANT
SUT VIC AB 2-0 CT1 TAPERPNT 27 (SUTURE) ×2 IMPLANT
SUTURE STRATFX 0 PDS 27 VIOLET (SUTURE) ×1 IMPLANT
SYR 3ML LL SCALE MARK (SYRINGE) IMPLANT
TOWEL GREEN STERILE FF (TOWEL DISPOSABLE) ×1 IMPLANT
TRAY FOLEY MTR SLVR 16FR STAT (SET/KITS/TRAYS/PACK) ×1 IMPLANT
TRAY FOLEY W/BAG SLVR 14FR LF (SET/KITS/TRAYS/PACK) IMPLANT
TUBE SUCTION HIGH CAP CLEAR NV (SUCTIONS) ×1 IMPLANT
WATER STERILE IRR 1000ML POUR (IV SOLUTION) ×1 IMPLANT

## 2024-01-02 NOTE — Discharge Instructions (Signed)

## 2024-01-02 NOTE — Anesthesia Procedure Notes (Signed)
 Spinal  Patient location during procedure: OR End time: 01/02/2024 2:56 PM Reason for block: surgical anesthesia Staffing Performed: anesthesiologist  Anesthesiologist: Jonne Netters, MD Performed by: Jonne Netters, MD Authorized by: Jonne Netters, MD   Preanesthetic Checklist Completed: patient identified, IV checked, site marked, risks and benefits discussed, surgical consent, monitors and equipment checked, pre-op evaluation and timeout performed Spinal Block Patient position: sitting Prep: DuraPrep Patient monitoring: heart rate, cardiac monitor, continuous pulse ox and blood pressure Approach: midline Location: L3-4 Injection technique: single-shot Needle Needle type: Pencan and Introducer  Needle gauge: 24 G Needle length: 9 cm Assessment Sensory level: T4 Events: CSF return Additional Notes Pt identified in Operating room.  Monitors applied. Working IV access confirmed. Sterile prep, drape lumbar spine.  1% lido local L 3,4.  #24ga Pencan into clear CSF L 3,4.  12mg  0.75% Bupivacaine  with dextrose  injected with asp CSF beginning and end of injection.  Patient asymptomatic, VSS, no heme aspirated, tolerated well.  Fay Hoop, MD

## 2024-01-02 NOTE — Interval H&P Note (Signed)
 History and Physical Interval Note:  01/02/2024 1:33 PM  [name]  has presented today for surgery, with the diagnosis of Right hip osteoarthritis.  The various methods of treatment have been discussed with the patient and family. After consideration of risks, benefits and other options for treatment, the patient has consented to  Procedure(s): ARTHROPLASTY, HIP, TOTAL, ANTERIOR APPROACH (Right) as a surgical intervention.  The patient's history has been reviewed, patient examined, no change in status, stable for surgery.  I have reviewed the patient's chart and labs.  Questions were answered to the patient's satisfaction.     Bevin Bucks

## 2024-01-02 NOTE — Anesthesia Postprocedure Evaluation (Signed)
 Anesthesia Post Note  Patient: [name]  Procedure(s) Performed: ARTHROPLASTY, HIP, TOTAL, ANTERIOR APPROACH (Right: Hip)     Patient location during evaluation: PACU Anesthesia Type: Spinal Level of consciousness: awake and alert, patient cooperative and oriented Pain management: pain level controlled Vital Signs Assessment: post-procedure vital signs reviewed and stable Respiratory status: nonlabored ventilation, spontaneous breathing and respiratory function stable Cardiovascular status: blood pressure returned to baseline and stable Postop Assessment: no apparent nausea or vomiting and spinal receding Anesthetic complications: no   No notable events documented.  Last Vitals:  Vitals:   01/02/24 1800 01/02/24 1824  BP: (!) 103/54 114/73  Pulse: (!) 53 (!) 55  Resp: 16 18  Temp: (!) 36.3 C 36.4 C  SpO2: 95% 98%         Ameliya Nicotra,E. Quinnlyn Hearns

## 2024-01-02 NOTE — Transfer of Care (Signed)
 Immediate Anesthesia Transfer of Care Note  Patient: [name]  Procedure(s) Performed: ARTHROPLASTY, HIP, TOTAL, ANTERIOR APPROACH (Right: Hip)  Patient Location: PACU  Anesthesia Type:MAC and Spinal  Level of Consciousness: drowsy  Airway & Oxygen Therapy: Patient Spontanous Breathing and Patient connected to face mask oxygen  Post-op Assessment: Report given to RN and Post -op Vital signs reviewed and stable  Post vital signs: Reviewed and stable  Last Vitals:  Vitals Value Taken Time  BP 105/51 01/02/24 1634  Temp    Pulse 75 01/02/24 1636  Resp 20 01/02/24 1636  SpO2 100 % 01/02/24 1636  Vitals shown include unfiled device data.  Last Pain:  Vitals:   01/02/24 1328  TempSrc:   PainSc: 9          Complications: No notable events documented.

## 2024-01-02 NOTE — Op Note (Signed)
 NAME:  Denise NAND.: [id number]      MEDICAL RECORD NO.: [id number]      FACILITY:  [facility]      PHYSICIAN:  Bevin Bucks  DATE OF BIRTH:  1956/04/01     DATE OF PROCEDURE:  01/02/2024                                 OPERATIVE REPORT         PREOPERATIVE DIAGNOSIS: Right  hip osteoarthritis.      POSTOPERATIVE DIAGNOSIS:  Right hip osteoarthritis.      PROCEDURE:  Right total hip replacement through an anterior approach   utilizing DePuy THR system, component size 52 mm pinnacle cup, a size 36+4 neutral   Altrex liner, a size 3 Hi Actis stem with a 36+1.5 delta ceramic   ball.      SURGEON:  Azalea Lento. Bernard Brick, M.D.      ASSISTANT:  Kim Pen, PA-C     ANESTHESIA:  Spinal.      SPECIMENS:  None.      COMPLICATIONS:  None.      BLOOD LOSS:  550-600 cc     DRAINS:  None.      INDICATION OF THE PROCEDURE:  [name] is a 68 y.o. female who had   presented to office for evaluation of right hip pain.  Radiographs revealed   progressive degenerative changes with bone-on-bone   articulation of the  hip joint, including subchondral cystic changes and osteophytes.  The patient had painful limited range of   motion significantly affecting their overall quality of life and function.  The patient was failing to    respond to conservative measures including medications and/or injections and activity modification and at this point was ready   to proceed with more definitive measures.  Consent was obtained for   benefit of pain relief.  Specific risks of infection, DVT, component   failure, dislocation, neurovascular injury, and need for revision surgery were reviewed in the office.     PROCEDURE IN DETAIL:  The patient was brought to operative theater.   Once adequate anesthesia, preoperative antibiotics, 2 gm of Ancef , 1 gm of Tranexamic Acid , and 10 mg of Decadron  were administered, the patient was positioned supine  on the [employer] table.  Once the patient was safely positioned with adequate padding of boney prominences we predraped out the hip, and used fluoroscopy to confirm orientation of the pelvis.      The right hip was then prepped and draped from proximal iliac crest to   mid thigh with a shower curtain technique.      Time-out was performed identifying the patient, planned procedure, and the appropriate extremity.     An incision was then made 2 cm lateral to the   anterior superior iliac spine extending over the orientation of the   tensor fascia lata muscle and sharp dissection was carried down to the   fascia of the muscle.      The fascia was then incised.  The muscle belly was identified and swept   laterally and retractor placed along the superior neck.  Following   cauterization of the circumflex vessels and removing some pericapsular   fat, a second cobra retractor was placed on the inferior  neck.  A T-capsulotomy was made along the line of the   superior neck to the trochanteric fossa, then extended proximally and   distally.  Tag sutures were placed and the retractors were then placed   intracapsular.  We then identified the trochanteric fossa and   orientation of my neck cut and then made a neck osteotomy with the femur on traction.  The femoral   head was removed without difficulty or complication.  Traction was let   off and retractors were placed posterior and anterior around the   acetabulum.      The labrum and foveal tissue were debrided.  I began reaming with a 45 mm   reamer and reamed up to 51 mm reamer with good bony bed preparation and a 52 mm  cup was chosen.  The final 52 mm Pinnacle cup was then impacted under fluoroscopy to confirm the depth of penetration and orientation with respect to   Abduction and forward flexion.  A screw was placed into the ilium followed by the hole eliminator.  The final   36+4 neutral Altrex liner was impacted with good visualized rim  fit.  The cup was positioned anatomically within the acetabular portion of the pelvis.      At this point, the femur was rolled to 100 degrees.  Further capsule was   released off the inferior aspect of the femoral neck.  I then   released the superior capsule proximally.  With the leg in a neutral position the hook was placed laterally   along the femur under the vastus lateralis origin and elevated manually and then held in position using the hook attachment on the bed.  The leg was then extended and adducted with the leg rolled to 100   degrees of external rotation.  Retractors were placed along the medial calcar and posteriorly over the greater trochanter.  Once the proximal femur was fully   exposed, I used a box osteotome to set orientation.  I then began   broaching with the starting chili pepper broach and passed this by hand and then broached up to 3.  With the 3 broach in place I chose a high offset neck and did several trial reductions.  The offset was appropriate, leg lengths   appeared to be equal best matched with the +1.5 head ball trial confirmed radiographically.   Given these findings, I went ahead and dislocated the hip, repositioned all   retractors and positioned the right hip in the extended and abducted position.  The final 3 Hi Actis stem was   chosen and it was impacted down to the level of neck cut.  Based on this   and the trial reductions, a final 36+1.5 delta ceramic ball was chosen and   impacted onto a clean and dry trunnion, and the hip was reduced.  The   hip had been irrigated throughout the case again at this point.  I did   reapproximate the superior capsular leaflet to the anterior leaflet   using #1 Vicryl.  The fascia of the   tensor fascia lata muscle was then reapproximated using #1 Vicryl and #0 Stratafix sutures.  The   remaining wound was closed with 2-0 Vicryl and running 4-0 Monocryl.   The hip was cleaned, dried, and dressed sterilely using  Dermabond and   Aquacel dressing.  The patient was then brought   to recovery room in stable condition tolerating the procedure well.    Odilia Bennett  Cleotilde Dago, PA-C was present for the entirety of the case involved from   preoperative positioning, perioperative retractor management, general   facilitation of the case, as well as primary wound closure as assistant.            Azalea Lento Bernard Brick, M.D.        01/02/2024 1:34 PM

## 2024-01-03 ENCOUNTER — Encounter (HOSPITAL_COMMUNITY): Payer: Self-pay | Admitting: Orthopedic Surgery

## 2024-01-03 ENCOUNTER — Other Ambulatory Visit (HOSPITAL_COMMUNITY): Payer: Self-pay

## 2024-01-03 ENCOUNTER — Other Ambulatory Visit: Payer: Self-pay

## 2024-01-03 ENCOUNTER — Other Ambulatory Visit: Payer: Self-pay | Admitting: Nurse Practitioner

## 2024-01-03 DIAGNOSIS — M1611 Unilateral primary osteoarthritis, right hip: Secondary | ICD-10-CM | POA: Diagnosis not present

## 2024-01-03 LAB — CBC
HCT: 27.3 % — ABNORMAL LOW (ref 36.0–46.0)
Hemoglobin: 8.2 g/dL — ABNORMAL LOW (ref 12.0–15.0)
MCH: 24.6 pg — ABNORMAL LOW (ref 26.0–34.0)
MCHC: 30 g/dL (ref 30.0–36.0)
MCV: 82 fL (ref 80.0–100.0)
Platelets: 150 10*3/uL (ref 150–400)
RBC: 3.33 MIL/uL — ABNORMAL LOW (ref 3.87–5.11)
RDW: 14.9 % (ref 11.5–15.5)
WBC: 9.5 10*3/uL (ref 4.0–10.5)
nRBC: 0 % (ref 0.0–0.2)

## 2024-01-03 LAB — BASIC METABOLIC PANEL WITH GFR
Anion gap: 8 (ref 5–15)
BUN: 38 mg/dL — ABNORMAL HIGH (ref 8–23)
CO2: 22 mmol/L (ref 22–32)
Calcium: 8 mg/dL — ABNORMAL LOW (ref 8.9–10.3)
Chloride: 107 mmol/L (ref 98–111)
Creatinine, Ser: 1.73 mg/dL — ABNORMAL HIGH (ref 0.44–1.00)
GFR, Estimated: 32 mL/min — ABNORMAL LOW (ref >60)
Glucose, Bld: 307 mg/dL — ABNORMAL HIGH (ref 70–99)
Potassium: 4.1 mmol/L (ref 3.5–5.1)
Sodium: 137 mmol/L (ref 135–145)

## 2024-01-03 MED ORDER — HYDROMORPHONE HCL 2 MG PO TABS
2.0000 mg | ORAL_TABLET | ORAL | Status: DC | PRN
  Administered 2024-01-03: 2 mg via ORAL
  Filled 2024-01-03: qty 1

## 2024-01-03 MED ORDER — ASPIRIN 81 MG PO CHEW
81.0000 mg | CHEWABLE_TABLET | Freq: Two times a day (BID) | ORAL | 0 refills | Status: AC
  Filled 2024-01-03: qty 56, 28d supply, fill #0

## 2024-01-03 MED ORDER — METHOCARBAMOL 500 MG PO TABS
500.0000 mg | ORAL_TABLET | Freq: Four times a day (QID) | ORAL | 2 refills | Status: AC | PRN
  Filled 2024-01-03: qty 40, 10d supply, fill #0

## 2024-01-03 MED ORDER — HYDROMORPHONE HCL 2 MG PO TABS
1.0000 mg | ORAL_TABLET | ORAL | 0 refills | Status: AC | PRN
  Filled 2024-01-03: qty 30, 5d supply, fill #0

## 2024-01-03 MED ORDER — POLYETHYLENE GLYCOL 3350 17 GM/SCOOP PO POWD
17.0000 g | Freq: Two times a day (BID) | ORAL | 0 refills | Status: AC
  Filled 2024-01-03: qty 238, 7d supply, fill #0

## 2024-01-03 MED ORDER — HYDROMORPHONE HCL 2 MG PO TABS
1.0000 mg | ORAL_TABLET | ORAL | Status: DC | PRN
  Filled 2024-01-03: qty 1

## 2024-01-03 MED ORDER — SENNA 8.6 MG PO TABS
2.0000 | ORAL_TABLET | Freq: Every day | ORAL | 0 refills | Status: AC
  Filled 2024-01-03: qty 28, 14d supply, fill #0

## 2024-01-03 NOTE — Plan of Care (Signed)
  Problem: Clinical Measurements: Goal: Will remain free from infection Outcome: Progressing   Problem: Activity: Goal: Risk for activity intolerance will decrease Outcome: Progressing   Problem: Pain Managment: Goal: General experience of comfort will improve and/or be controlled Outcome: Progressing   Problem: Safety: Goal: Ability to remain free from injury will improve Outcome: Progressing

## 2024-01-03 NOTE — Progress Notes (Signed)
 Physical Therapy Treatment Patient Details Name: JULIO STORR MRN: [id number] DOB: 1955/09/09 Today's Date: 01/03/2024   History of Present Illness 68 Yo female S/P R Direct amterior THA on 01/02/24. PMH: BilTKA, DM,peripheral neuropathy,  cervicl fusion, CKD    PT Comments  The patient has met PT goals for  safe Dc to home.    If plan is discharge home, recommend the following: A lot of help with bathing/dressing/bathroom;Assistance with cooking/housework;Assist for transportation;Help with stairs or ramp for entrance   Can travel by private vehicle        Equipment Recommendations  Rolling walker (2 wheels)    Recommendations for Other Services       Precautions / Restrictions Precautions Precautions: Fall Restrictions Weight Bearing Restrictions Per Provider Order: No     Mobility  Bed Mobility Overal bed mobility: Modified Independent             General bed mobility comments: seated on EOB    Transfers Overall transfer level: Needs assistance Equipment used: Rolling walker (2 wheels) Transfers: Sit to/from Stand Sit to Stand: Modified independent (Device/Increase time)                Ambulation/Gait Ambulation/Gait assistance: Supervision Gait Distance (Feet): 150 Feet Assistive device: Rolling walker (2 wheels) Gait Pattern/deviations: Step-through pattern       General Gait Details: gait slow and steady   Stairs Stairs: Yes Stairs assistance: Supervision Stair Management: Two rails, Forwards Number of Stairs: 3 General stair comments: pt. knows sequence   Wheelchair Mobility     Tilt Bed    Modified Rankin (Stroke Patients Only)       Balance Overall balance assessment: No apparent balance deficits (not formally assessed)                                          Communication Communication Communication: No apparent difficulties  Cognition Arousal: Alert Behavior During Therapy: WFL for tasks  assessed/performed   PT - Cognitive impairments: No apparent impairments                                Cueing    Exercises    General Comments        Pertinent Vitals/Pain Pain Assessment Pain Assessment: 0-10 Pain Score: 3  Pain Descriptors / Indicators: Sore Pain Intervention(s): Monitored during session, Premedicated before session    Home Living Family/patient expects to be discharged to:: Private residence Living Arrangements: Other relatives Available Help at Discharge: Family Type of Home: House Home Access: Stairs to enter Entrance Stairs-Rails: Right;Left;Can reach both Secretary/administrator of Steps: 5   Home Layout: One level Home Equipment: Agricultural consultant (2 wheels)      Prior Function            PT Goals (current goals can now be found in the care plan section) Acute Rehab PT Goals Patient Stated Goal: go home PT Goal Formulation: With patient/family Time For Goal Achievement: 01/10/24 Potential to Achieve Goals: Good Progress towards PT goals: Progressing toward goals    Frequency    7X/week      PT Plan      Co-evaluation              AM-PAC PT "6 Clicks" Mobility   Outcome Measure  Help needed turning from your  back to your side while in a flat bed without using bedrails?: None Help needed moving from lying on your back to sitting on the side of a flat bed without using bedrails?: None Help needed moving to and from a bed to a chair (including a wheelchair)?: None Help needed standing up from a chair using your arms (e.g., wheelchair or bedside chair)?: None Help needed to walk in hospital room?: A Little Help needed climbing 3-5 steps with a railing? : A Little 6 Click Score: 22    End of Session Equipment Utilized During Treatment: Gait belt Activity Tolerance: Patient tolerated treatment well Patient left: in bed;with family/visitor present;with call bell/phone within reach Nurse Communication: Mobility  status PT Visit Diagnosis: Unsteadiness on feet (R26.81);Difficulty in walking, not elsewhere classified (R26.2);Pain Pain - Right/Left: Right Pain - part of body: Hip     Time: 1130-1140 PT Time Calculation (min) (ACUTE ONLY): 10 min  Charges:    $Gait Training: 8-22 mins PT General Charges $$ ACUTE PT VISIT: 1 Visit                     Abelina Hoes PT Acute Rehabilitation Services Office [phone]  Dareen Ebbing 01/03/2024, 12:48 PM

## 2024-01-03 NOTE — Care Management Obs Status (Signed)
 MEDICARE OBSERVATION STATUS NOTIFICATION   Patient Details  [name] MRN: [id number] Date of Birth: Nov 16, 1955   Medicare Observation Status Notification Given:  Yes    Bari Leys, RN 01/03/2024, 9:31 AM

## 2024-01-03 NOTE — TOC Transition Note (Signed)
 Transition of Care [facility]) - Discharge Note   Patient Details  Name: LEONETTE TISCHER MRN: [id number] Date of Birth: [birth year]  [facility] of [facility] CM/SW Contact:  Bari Leys, RN Phone Number: 01/03/2024, 9:45 AM   Clinical Narrative:  Met with patient at bedside to review dc therapy and home equipment needs, pt confirmed HEP, reports she has a RW but it is older and would like a new one if possible, youth RW delivered to bedside by [employer]. MOON completed. No TOC needs.      Final next level of care: Home/Self Care     Patient Goals and CMS Choice Patient states their goals for this hospitalization and ongoing recovery are:: return home          Discharge Placement                       Discharge Plan and Services Additional resources added to the After Visit Summary for                                       Social Drivers of Health (SDOH) Interventions SDOH Screenings   Food Insecurity: No Food Insecurity (01/02/2024)  Housing: Low Risk  (01/02/2024)  Transportation Needs: No Transportation Needs (01/02/2024)  Utilities: Not At Risk (01/02/2024)  Social Connections: Moderately Integrated (01/02/2024)  Tobacco Use: Medium Risk (01/02/2024)     Readmission Risk Interventions     No data to display

## 2024-01-03 NOTE — Progress Notes (Signed)
   Subjective: 1 Day Post-Op Procedure(s) (LRB): ARTHROPLASTY, HIP, TOTAL, ANTERIOR APPROACH (Right) Patient reports pain as mild.   Patient seen in rounds with Dr. Bernard Brick. Patient is resting in bed on exam this morning. No acute events overnight. Foley catheter removed. Patient has not been up with PT yet.  We will start therapy today.   Objective: Vital signs in last 24 hours: Temp:  [96.2 F (35.7 C)-98.6 F (37 C)] 97.6 F (36.4 C) (05/28 0529) Pulse Rate:  [50-76] 66 (05/28 0757) Resp:  [16-19] 16 (05/28 0529) BP: (81-137)/(51-73) 110/58 (05/28 0757) SpO2:  [95 %-100 %] 95 % (05/28 0757) Weight:  [70.8 kg] 70.8 kg (05/27 1328)  Intake/Output from previous day:  Intake/Output Summary (Last 24 hours) at 01/03/2024 0824 Last data filed at 01/03/2024 0529 Gross per 24 hour  Intake 1269.97 ml  Output 1100 ml  Net 169.97 ml     Intake/Output this shift: No intake/output data recorded.  Labs: Recent Labs    01/03/24 0349  HGB 8.2*   Recent Labs    01/03/24 0349  WBC 9.5  RBC 3.33*  HCT 27.3*  PLT 150   Recent Labs    01/03/24 0349  NA 137  K 4.1  CL 107  CO2 22  BUN 38*  CREATININE 1.73*  GLUCOSE 307*  CALCIUM  8.0*   No results for input(s): "LABPT", "INR" in the last 72 hours.  Exam: General - Patient is Alert and Oriented Extremity - Neurologically intact Sensation intact distally Intact pulses distally Dorsiflexion/Plantar flexion intact Dressing - dressing C/D/I Motor Function - intact, moving foot and toes well on exam.   Past Medical History:  Diagnosis Date   Acute bronchitis    Anemia    hx of anemia   Anginal pain (HCC)    Arthritis    osteo   Asthma    Chronic kidney disease (CKD), stage IV (severe) (HCC)    COPD (chronic obstructive pulmonary disease) (HCC)    Coronary atherosclerosis    Cough    GERD (gastroesophageal reflux disease)    History of colon polyps    History of migraine    Hyperlipidemia    Hypertension     Obstructive sleep apnea (adult) (pediatric)    NO CPAP    Peripheral neuropathy    Type II or unspecified type diabetes mellitus without mention of complication, not stated as uncontrolled    type 2   Unspecified hypothyroidism     Assessment/Plan: 1 Day Post-Op Procedure(s) (LRB): ARTHROPLASTY, HIP, TOTAL, ANTERIOR APPROACH (Right) Principal Problem:   S/P total right hip arthroplasty  Estimated body mass index is 28.53 kg/m as calculated from the following:   Height as of this encounter: 5\' 2"  (1.575 m).   Weight as of this encounter: 70.8 kg. Advance diet Up with therapy D/C IV fluids  DVT Prophylaxis - Aspirin  Weight bearing as tolerated.  Will switch to dilaudid  related to nausea yesterday Will send home with phenergan   Plan is to go Home after hospital stay. Plan for discharge today after meeting goals with therapy. Follow up in the office in 2 weeks.   Kim Pen, PA-C Orthopedic Surgery [phone] 01/03/2024, 8:24 AM

## 2024-01-03 NOTE — Evaluation (Signed)
 Physical Therapy Evaluation Patient Details [name] MRN: [id number] DOB: 27-Mar-1956 Today's Date: 01/03/2024  History of Present Illness  68 Yo female S/P R Direct amterior THA on 01/02/24. PMH: BilTKA, DM,peripheral neuropathy,  cervicl fusion, CKD  Clinical Impression  The patient is progressing very well. Patient ill practice steps then should be ready for DC. Patient will benefit from  PT while in acute care.       If plan is discharge home, recommend the following: A lot of help with bathing/dressing/bathroom;Assistance with cooking/housework;Assist for transportation;Help with stairs or ramp for entrance   Can travel by private vehicle        Equipment Recommendations Rolling walker (2 wheels)  Recommendations for Other Services       Functional Status Assessment Patient has had a recent decline in their functional status and demonstrates the ability to make significant improvements in function in a reasonable and predictable amount of time.     Precautions / Restrictions Precautions Precautions: Fall Restrictions Weight Bearing Restrictions Per Provider Order: No      Mobility  Bed Mobility Overal bed mobility: Modified Independent                  Transfers Overall transfer level: Needs assistance Equipment used: Rolling walker (2 wheels) Transfers: Sit to/from Stand Sit to Stand: Supervision                Ambulation/Gait Ambulation/Gait assistance: Supervision Gait Distance (Feet): 200 Feet Assistive device: Rolling walker (2 wheels) Gait Pattern/deviations: Step-through pattern       General Gait Details: gait slow and steady  Stairs            Wheelchair Mobility     Tilt Bed    Modified Rankin (Stroke Patients Only)       Balance Overall balance assessment: No apparent balance deficits (not formally assessed)                                           Pertinent Vitals/Pain Pain  Assessment Pain Assessment: 0-10 Pain Score: 3  Pain Descriptors / Indicators: Sore Pain Intervention(s): Monitored during session, Premedicated before session    Home Living Family/patient expects to be discharged to:: Private residence Living Arrangements: Other relatives Available Help at Discharge: Family Type of Home: House Home Access: Stairs to enter Entrance Stairs-Rails: Right;Left;Can reach both Secretary/administrator of Steps: 5   Home Layout: One level Home Equipment: Agricultural consultant (2 wheels)      Prior Function Prior Level of Function : Independent/Modified Independent                     Extremity/Trunk Assessment   Upper Extremity Assessment Upper Extremity Assessment: Overall WFL for tasks assessed    Lower Extremity Assessment Lower Extremity Assessment: RLE deficits/detail RLE Deficits / Details: hip flex and abd  with no assistance    Cervical / Trunk Assessment Cervical / Trunk Assessment: Normal  Communication   Communication Communication: No apparent difficulties    Cognition Arousal: Alert Behavior During Therapy: WFL for tasks assessed/performed   PT - Cognitive impairments: No apparent impairments                                 Cueing       General Comments  Exercises Total Joint Exercises Ankle Circles/Pumps: AROM, Both, 10 reps Short Arc Quad: AROM, Right, 10 reps Heel Slides: AROM, Right, 10 reps Hip ABduction/ADduction: AROM, Right, 10 reps Long Arc Quad: AROM, Right, 10 reps   Assessment/Plan    PT Assessment Patient needs continued PT services  PT Problem List Decreased strength;Decreased range of motion;Decreased activity tolerance;Decreased mobility;Pain       PT Treatment Interventions DME instruction;Therapeutic exercise;Gait training;Stair training;Functional mobility training;Therapeutic activities;Patient/family education    PT Goals (Current goals can be found in the Care Plan  section)  Acute Rehab PT Goals Patient Stated Goal: go home PT Goal Formulation: With patient/family Time For Goal Achievement: 01/10/24 Potential to Achieve Goals: Good    Frequency 7X/week     Co-evaluation               AM-PAC PT "6 Clicks" Mobility  Outcome Measure Help needed turning from your back to your side while in a flat bed without using bedrails?: None Help needed moving from lying on your back to sitting on the side of a flat bed without using bedrails?: None Help needed moving to and from a bed to a chair (including a wheelchair)?: A Little Help needed standing up from a chair using your arms (e.g., wheelchair or bedside chair)?: A Little Help needed to walk in hospital room?: A Little Help needed climbing 3-5 steps with a railing? : A Little 6 Click Score: 20    End of Session Equipment Utilized During Treatment: Gait belt Activity Tolerance: Patient tolerated treatment well Patient left: in chair;with call bell/phone within reach;with family/visitor present Nurse Communication: Mobility status PT Visit Diagnosis: Unsteadiness on feet (R26.81);Difficulty in walking, not elsewhere classified (R26.2);Pain Pain - Right/Left: Right Pain - part of body: Hip    Time: 8295-6213 PT Time Calculation (min) (ACUTE ONLY): 27 min   Charges:   PT Evaluation $PT Eval Low Complexity: 1 Low PT Treatments $Gait Training: 8-22 mins PT General Charges $$ ACUTE PT VISIT: 1 Visit         [name] PT Acute Rehabilitation Services Office [phone]   [name] 01/03/2024, 10:34 AM

## 2024-01-03 NOTE — Progress Notes (Signed)
 PT requested RT return Breo DPI to Pharmacy for credit- done at [phone].

## 2024-01-10 ENCOUNTER — Other Ambulatory Visit: Payer: Self-pay

## 2024-01-10 NOTE — Progress Notes (Signed)
 Specialty Pharmacy Ongoing Clinical Assessment Note  [name] is a 68 y.o. female who is being followed by the specialty pharmacy service for RxSp Asthma/COPD   Patient's specialty medication(s) reviewed today: Mepolizumab  (Nucala )   Missed doses in the last 4 weeks: 0   Patient/Caregiver did not have any additional questions or concerns.   Therapeutic benefit summary: Patient is achieving benefit   Adverse events/side effects summary: No adverse events/side effects   Patient's therapy is appropriate to: Continue    Goals Addressed             This Visit's Progress    Reduce disease symptoms including coughing and shortness of breath       Patient is on track. Patient will maintain adherence and avoid flare triggers. Patient reported that her breathing was well-controlled and no recent flare ups.         Follow up: 12 months  [facility]

## 2024-01-10 NOTE — Progress Notes (Signed)
 Specialty Pharmacy Refill Coordination Note  [name] is a 68 y.o. female contacted today regarding refills of specialty medication(s) Mepolizumab  (Nucala )   Patient requested Delivery   Delivery date: 01/18/24   Verified address: [address], [place]   Medication will be filled on 01/17/24.

## 2024-01-16 ENCOUNTER — Other Ambulatory Visit: Payer: Self-pay | Admitting: Cardiovascular Disease

## 2024-01-22 NOTE — Discharge Summary (Signed)
 Patient ID: [name] MRN: [id number] DOB/AGE: 04-05-1956 68 y.o.  Admit date: 01/02/2024 Discharge date: 01/03/2024  Admission Diagnoses:  Right hip osteoarthritis  Discharge Diagnoses:  Principal Problem:   S/P total right hip arthroplasty   Past Medical History:  Diagnosis Date   Acute bronchitis    Anemia    hx of anemia   Anginal pain (HCC)    Arthritis    osteo   Asthma    Chronic kidney disease (CKD), stage IV (severe) (HCC)    COPD (chronic obstructive pulmonary disease) (HCC)    Coronary atherosclerosis    Cough    GERD (gastroesophageal reflux disease)    History of colon polyps    History of migraine    Hyperlipidemia    Hypertension    Obstructive sleep apnea (adult) (pediatric)    NO CPAP    Peripheral neuropathy    Type II or unspecified type diabetes mellitus without mention of complication, not stated as uncontrolled    type 2   Unspecified hypothyroidism     Surgeries: Procedure(s): ARTHROPLASTY, HIP, TOTAL, ANTERIOR APPROACH on 01/02/2024   Consultants:   Discharged Condition: Improved  Hospital Course: [name] is an 68 y.o. female who was admitted 01/02/2024 for operative treatment ofS/P total right hip arthroplasty. Patient has severe unremitting pain that affects sleep, daily activities, and work/hobbies. After pre-op clearance the patient was taken to the operating room on 01/02/2024 and underwent  Procedure(s): ARTHROPLASTY, HIP, TOTAL, ANTERIOR APPROACH.    Patient was given perioperative antibiotics:  Anti-infectives (From admission, onward)    Start     Dose/Rate Route Frequency Ordered Stop   01/03/24 0600  ceFAZolin  (ANCEF ) IVPB 2g/100 mL premix        2 g 200 mL/hr over 30 Minutes Intravenous On call to O.R. 01/02/24 1227 01/02/24 1518   01/02/24 2100  ceFAZolin  (ANCEF ) IVPB 2g/100 mL premix        2 g 200 mL/hr over 30 Minutes Intravenous Every 6 hours 01/02/24 1808 01/03/24 0420        Patient was given  sequential compression devices, early ambulation, and chemoprophylaxis to prevent DVT. Patient worked with PT and was meeting their goals regarding safe ambulation and transfers.  Patient benefited maximally from hospital stay and there were no complications.    Recent vital signs: No data found.   Recent laboratory studies: No results for input(s): WBC, HGB, HCT, PLT, NA, K, CL, CO2, BUN, CREATININE, GLUCOSE, INR, CALCIUM  in the last 72 hours.  Invalid input(s): PT, 2   Discharge Medications:   Allergies as of 01/03/2024       Reactions   Allopurinol Rash   Toxic epidermal necrolysis   Codeine Other (See Comments)   REACTION: hallucinations, funny in the head    Levaquin [levofloxacin] Nausea And Vomiting   Sulfa Antibiotics Hives   Sulfonamide Derivatives Hives        Medication List     STOP taking these medications    diazepam  5 MG tablet Commonly known as: VALIUM    diclofenac 75 MG EC tablet Commonly known as: VOLTAREN   predniSONE  20 MG tablet Commonly known as: DELTASONE        TAKE these medications    albuterol  108 (90 Base) MCG/ACT inhaler Commonly known as: Proventil  HFA Inhale 2 puffs into the lungs every 6 (six) hours as needed for wheezing or shortness of breath. For shortness of breath and wheezing   Aspirin  Low Dose 81 MG chewable tablet  Generic drug: aspirin  Chew 1 tablet (81 mg total) by mouth 2 (two) times daily for 28 days.   B-12 PO Take 1,000 mcg by mouth daily.   B-6 PO Take 1 tablet by mouth daily.   BIOTIN PO Take 500 mg by mouth daily.   budesonide -formoterol  160-4.5 MCG/ACT inhaler Commonly known as: SYMBICORT  Inhale 2 puffs into the lungs 2 (two) times daily.   clotrimazole  10 MG troche Commonly known as: MYCELEX  Take 1 tablet (10 mg total) by mouth 5 (five) times daily.   colchicine  0.6 MG tablet Take 0.6 mg by mouth daily as needed (gout flare).   Dexlansoprazole 30 MG capsule DR Take  30 mg by mouth daily as needed (acid reflux).   ezetimibe  10 MG tablet Commonly known as: ZETIA  Take 10 mg by mouth daily.   fluticasone  50 MCG/ACT nasal spray Commonly known as: Flonase  Place 2 sprays into both nostrils daily. What changed:  when to take this reasons to take this   Flutter Devi 1 puff by Does not apply route daily.   furosemide  40 MG tablet Commonly known as: LASIX  Take 1 tablet (40 mg total) by mouth daily. In the morning What changed:  how much to take when to take this additional instructions   hydrocortisone  2.5 % cream Apply topically 2 (two) times daily. Apply as needed two times daily   HYDROmorphone  2 MG tablet Commonly known as: DILAUDID  Take 0.5-1 tablets (1-2 mg total) by mouth every 4 (four) hours as needed for severe pain (pain score 7-10).   ipratropium-albuterol  0.5-2.5 (3) MG/3ML Soln Commonly known as: DUONEB Take 3 mLs by nebulization every 4 (four) hours as needed.   Jardiance  10 MG Tabs tablet Generic drug: empagliflozin  Take 10 mg by mouth daily.   Kerendia  10 MG Tabs Generic drug: Finerenone  Take 10 mg by mouth daily.   levothyroxine  150 MCG tablet Commonly known as: SYNTHROID  Take 150 mcg by mouth daily before breakfast.   meclizine  25 MG tablet Commonly known as: ANTIVERT  Take 25 mg by mouth 3 (three) times daily as needed for dizziness or nausea.   methocarbamol  500 MG tablet Commonly known as: ROBAXIN  Take 1 tablet (500 mg total) by mouth every 6 (six) hours as needed for muscle spasms.   metoprolol  succinate 25 MG 24 hr tablet Commonly known as: TOPROL -XL TAKE 1 TABLET BY MOUTH DAILY What changed: how much to take   Mounjaro 5 MG/0.5ML Pen Generic drug: tirzepatide Inject 5 mg into the skin once a week.   multivitamin with minerals Tabs tablet Take 1 tablet by mouth daily.   nitroGLYCERIN  0.4 MG/SPRAY spray Commonly known as: NITROLINGUAL  Place 1 spray under the tongue every 5 (five) minutes x 3 doses as  needed for chest pain.   NovoFine Plus Pen Needle 32G X 4 MM Misc Generic drug: Insulin  Pen Needle Inject into the skin.   Nucala  100 MG/ML Soaj Generic drug: Mepolizumab  Inject 1 mL (100 mg total) into the skin every 28 (twenty-eight) days.   ONE TOUCH ULTRA TEST test strip Generic drug: glucose blood Use as directed   OneTouch Delica Plus Lancet33G Misc Apply 1 each topically 4 (four) times daily.   polyethylene glycol powder 17 GM/SCOOP powder Commonly known as: GLYCOLAX /MIRALAX  Take 17 grams dissolved in liquid by mouth 2 (two) times daily.   potassium chloride  10 MEQ tablet Commonly known as: KLOR-CON  M Take 1 tablet (10 mEq total) by mouth daily.   pregabalin  100 MG capsule Commonly known as: LYRICA  Take  100 mg by mouth 2 (two) times daily.   ranolazine  500 MG 12 hr tablet Commonly known as: RANEXA  Take 1 tablet (500 mg total) by mouth 2 (two) times daily.   Refresh Optive Mega-3 0.5-1-0.5 % Soln Generic drug: Carboxymeth-Glyc-Polysorb PF Place 1 drop into both eyes daily in the afternoon.   rosuvastatin  20 MG tablet Commonly known as: CRESTOR  TAKE 1 TABLET BY MOUTH DAILY   triamterene -hydrochlorothiazide  37.5-25 MG tablet Commonly known as: MAXZIDE -25 TAKE 1 TABLET BY MOUTH DAILY   VITAMIN C PO Take 1 tablet by mouth daily.       ASK your doctor about these medications    senna 8.6 MG Tabs tablet Commonly known as: SENOKOT Take 2 tablets (17.2 mg total) by mouth at bedtime for 14 days. Ask about: Should I take this medication?               Discharge Care Instructions  (From admission, onward)           Start     Ordered   01/03/24 0000  Change dressing       Comments: Maintain surgical dressing until follow up in the clinic. If the edges start to pull up, may reinforce with tape. If the dressing is no longer working, may remove and cover with gauze and tape, but must keep the area dry and clean.  Call with any questions or concerns.    01/03/24 0829            Diagnostic Studies: DG Pelvis Portable Result Date: 01/02/2024 CLINICAL DATA:  Status post hip arthroplasty. EXAM: PORTABLE PELVIS 1-2 VIEWS COMPARISON:  None Available. FINDINGS: Right hip arthroplasty in expected alignment. No periprosthetic lucency or fracture. Recent postsurgical change includes air and edema in the soft tissues. IMPRESSION: Right hip arthroplasty without immediate postoperative complication. Electronically Signed   By: Chadwick Colonel M.D.   On: 01/02/2024 18:19   DG HIP UNILAT WITH PELVIS 1V RIGHT Result Date: 01/02/2024 CLINICAL DATA:  Elective surgery. EXAM: DG HIP (WITH OR WITHOUT PELVIS) 1V RIGHT COMPARISON:  None Available. FINDINGS: Two fluoroscopic spot views of the pelvis and right hip obtained in the operating room. Images during hip arthroplasty. Fluoroscopy time 10 seconds. Dose 1.088 mGy. IMPRESSION: Intraoperative fluoroscopy during right hip arthroplasty. Electronically Signed   By: Chadwick Colonel M.D.   On: 01/02/2024 18:18   DG C-Arm 1-60 Min-No Report Result Date: 01/02/2024 Fluoroscopy was utilized by the requesting physician.  No radiographic interpretation.   DG C-Arm 1-60 Min-No Report Result Date: 01/02/2024 Fluoroscopy was utilized by the requesting physician.  No radiographic interpretation.    Disposition: Discharge disposition: 01-Home or Self Care       Discharge Instructions     Call MD / Call 911   Complete by: As directed    If you experience chest pain or shortness of breath, CALL 911 and be transported to the hospital emergency room.  If you develope a fever above 101 F, pus (white drainage) or increased drainage or redness at the wound, or calf pain, call your surgeon's office.   Change dressing   Complete by: As directed    Maintain surgical dressing until follow up in the clinic. If the edges start to pull up, may reinforce with tape. If the dressing is no longer working, may remove and cover  with gauze and tape, but must keep the area dry and clean.  Call with any questions or concerns.   Constipation Prevention   Complete  by: As directed    Drink plenty of fluids.  Prune juice may be helpful.  You may use a stool softener, such as Colace (over the counter) 100 mg twice a day.  Use MiraLax  (over the counter) for constipation as needed.   Diet - low sodium heart healthy   Complete by: As directed    Increase activity slowly as tolerated   Complete by: As directed    Weight bearing as tolerated with assist device (walker, cane, etc) as directed, use it as long as suggested by your surgeon or therapist, typically at least 4-6 weeks.   Post-operative opioid taper instructions:   Complete by: As directed    POST-OPERATIVE OPIOID TAPER INSTRUCTIONS: It is important to wean off of your opioid medication as soon as possible. If you do not need pain medication after your surgery it is ok to stop day one. Opioids include: Codeine, Hydrocodone (Norco, Vicodin), Oxycodone (Percocet, oxycontin ) and hydromorphone  amongst others.  Long term and even short term use of opiods can cause: Increased pain response Dependence Constipation Depression Respiratory depression And more.  Withdrawal symptoms can include Flu like symptoms Nausea, vomiting And more Techniques to manage these symptoms Hydrate well Eat regular healthy meals Stay active Use relaxation techniques(deep breathing, meditating, yoga) Do Not substitute Alcohol  to help with tapering If you have been on opioids for less than two weeks and do not have pain than it is ok to stop all together.  Plan to wean off of opioids This plan should start within one week post op of your joint replacement. Maintain the same interval or time between taking each dose and first decrease the dose.  Cut the total daily intake of opioids by one tablet each day Next start to increase the time between doses. The last dose that should be eliminated  is the evening dose.      TED hose   Complete by: As directed    Use stockings (TED hose) for 2 weeks on both leg(s).  You may remove them at night for sleeping.        Follow-up Information     Claiborne Crew, MD. Schedule an appointment as soon as possible for a visit in 2 week(s).   Specialty: Orthopedic Surgery Contact information: [address] [place] [address] [phone]                  Signed: Earnie Gola 01/22/2024, 2:42 PM

## 2024-02-06 ENCOUNTER — Other Ambulatory Visit (HOSPITAL_COMMUNITY): Payer: Self-pay

## 2024-02-08 ENCOUNTER — Other Ambulatory Visit: Payer: Self-pay

## 2024-02-08 NOTE — Progress Notes (Signed)
 Specialty Pharmacy Refill Coordination Note  [name] is a 68 y.o. female contacted today regarding refills of specialty medication(s) Mepolizumab  (Nucala )   Patient requested Delivery   Delivery date: 02/14/24   Verified address: [address], [place]   Medication will be filled on 07.08.25.

## 2024-02-12 ENCOUNTER — Other Ambulatory Visit: Payer: Self-pay | Admitting: Nurse Practitioner

## 2024-02-28 ENCOUNTER — Other Ambulatory Visit (HOSPITAL_COMMUNITY): Payer: Self-pay

## 2024-03-13 ENCOUNTER — Other Ambulatory Visit: Payer: Self-pay

## 2024-03-13 NOTE — Progress Notes (Signed)
 Specialty Pharmacy Refill Coordination Note  [name] is a 68 y.o. female contacted today regarding refills of specialty medication(s) Mepolizumab  (Nucala )   Patient requested Delivery   Delivery date: 03/19/24   Verified address: [address], [place]   Medication will be filled on 08.11.25.

## 2024-03-18 ENCOUNTER — Other Ambulatory Visit: Payer: Self-pay

## 2024-04-09 ENCOUNTER — Other Ambulatory Visit (HOSPITAL_COMMUNITY): Payer: Self-pay

## 2024-04-09 ENCOUNTER — Other Ambulatory Visit: Payer: Self-pay

## 2024-04-09 ENCOUNTER — Other Ambulatory Visit: Payer: Self-pay | Admitting: Pulmonary Disease

## 2024-04-09 DIAGNOSIS — J4551 Severe persistent asthma with (acute) exacerbation: Secondary | ICD-10-CM

## 2024-04-09 NOTE — Progress Notes (Signed)
 Specialty Pharmacy Refill Coordination Note  [name] is a 68 y.o. female contacted today regarding refills of specialty medication(s) Mepolizumab  (Nucala )   Patient requested Delivery   Delivery date: 04/13/27   Verified address: [address], [place]   Medication will be filled on 04/11/24. This fill date is pending response to refill request from provider. Patient is aware and if they have not received fill by intended date they must follow up with pharmacy.

## 2024-04-11 ENCOUNTER — Other Ambulatory Visit: Payer: Self-pay

## 2024-04-11 MED ORDER — NUCALA 100 MG/ML ~~LOC~~ SOAJ
100.0000 mg | SUBCUTANEOUS | 1 refills | Status: DC
  Filled 2024-04-11: qty 1, 28d supply, fill #0
  Filled 2024-05-02 – 2024-05-06 (×2): qty 1, 28d supply, fill #1

## 2024-04-11 NOTE — Telephone Encounter (Signed)
 Refill sent for NUCALA  to [facility]: [phone]   Dose: 100mg  [place] every 28 days   Last OV: 09/05/23 Provider: Dr. Neda  Next OV: overdue, due in June 2025  Routing to scheduling team for follow-up on appt scheduling  Aleck Puls, PharmD, BCPS Clinical Pharmacist  [facility]

## 2024-04-15 ENCOUNTER — Other Ambulatory Visit: Payer: Self-pay

## 2024-04-16 ENCOUNTER — Ambulatory Visit (INDEPENDENT_AMBULATORY_CARE_PROVIDER_SITE_OTHER): Admitting: Pulmonary Disease

## 2024-04-16 VITALS — BP 104/55 | HR 73 | Ht 62.0 in | Wt 155.0 lb

## 2024-04-16 DIAGNOSIS — J8283 Eosinophilic asthma: Secondary | ICD-10-CM

## 2024-04-16 DIAGNOSIS — J4550 Severe persistent asthma, uncomplicated: Secondary | ICD-10-CM

## 2024-04-16 DIAGNOSIS — Z87891 Personal history of nicotine dependence: Secondary | ICD-10-CM

## 2024-04-16 DIAGNOSIS — K219 Gastro-esophageal reflux disease without esophagitis: Secondary | ICD-10-CM

## 2024-04-16 NOTE — Patient Instructions (Signed)
 Continue Nucala   Try and get back to using Symbicort  -Try using his Symbicort  with a spacer, this will decrease the likelihood of yeast infection  Continue albuterol  as needed  Graded activities as tolerated  Call us  with significant concerns  Follow-up in 6 months

## 2024-04-16 NOTE — Progress Notes (Signed)
 Synopsis: Referred in January 2021 for SOB by Nichole Senior, MD.   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  Continues to try to stay active    She has a history of allergic rhinosinusitis, sinus drainage Flonase , Singulair   Not feeling acutely ill at present  Past Medical History:  Diagnosis Date   Acute bronchitis    Anemia    hx of anemia   Anginal pain (HCC)    Arthritis    osteo   Asthma    Chronic kidney disease (CKD), stage IV (severe) (HCC)    COPD (chronic obstructive pulmonary disease) (HCC)    Coronary atherosclerosis    Cough    GERD (gastroesophageal reflux disease)    History of colon polyps    History of migraine    Hyperlipidemia    Hypertension    Obstructive sleep apnea (adult) (pediatric)    NO CPAP    Peripheral neuropathy    Type II or unspecified type diabetes mellitus without mention of complication, not stated as uncontrolled    type 2   Unspecified hypothyroidism      Family History  Problem Relation Age of Onset   Hypertension Mother    Heart disease Mother    Stroke Mother    Prostate cancer Father    Heart disease Brother    Heart attack Sister      Past Surgical History:  Procedure Laterality Date   ANTERIOR FUSION CERVICAL SPINE  2009   CARPAL TUNNEL RELEASE Left    COLONOSCOPY     CORONARY STENT INTERVENTION  2007   LEFT HEART CATHETERIZATION WITH CORONARY ANGIOGRAM N/A 05/29/2014   Procedure: LEFT HEART CATHETERIZATION WITH CORONARY ANGIOGRAM;  Surgeon: Victory LELON Claudene DOUGLAS, MD;  Location: [facility] CATH [facility];  Service:  Cardiovascular;  Laterality: N/A;   TOTAL HIP ARTHROPLASTY Right 01/02/2024   Procedure: ARTHROPLASTY, HIP, TOTAL, ANTERIOR APPROACH;  Surgeon: Ernie Cough, MD;  Location: [facility];  Service: Orthopedics;  Laterality: Right;   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   Procedure: TOTAL KNEE ARTHROPLASTY;  Surgeon: Duwayne Purchase, MD;  Location: [facility];  Service: Orthopedics;  Laterality: Left;  3 hrs   TOTAL KNEE ARTHROPLASTY Right 10/01/2021   Procedure: TOTAL KNEE ARTHROPLASTY;  Surgeon: Duwayne Purchase, MD;  Location: [facility];  Service: Orthopedics;  Laterality: Right;   TUBAL LIGATION  1990   WISDOM TOOTH EXTRACTION      Social History   Socioeconomic History   Marital status: Widowed    Spouse name: Not on file   Number of children: 0   Years of education: Not on file   Highest education level: Not on file  Occupational History   Occupation: retired  Tobacco Use   Smoking status: Former    Current packs/day: 0.00    Average packs/day: 0.5 packs/day for 25.0 years (12.5 ttl pk-yrs)    Types: Cigarettes    Start date: 08/08/1978    Quit date: 08/09/2003    Years since quitting: 20.7   Smokeless tobacco: Never  Vaping Use   Vaping status: Never Used  Substance and Sexual Activity  Alcohol  use: Not Currently   Drug use: No   Sexual activity: Not on file  Other Topics Concern   Not on file  Social History Narrative   Not on file   Social Drivers of Health   Financial Resource Strain: Not on file  Food Insecurity: No Food Insecurity (01/02/2024)   Hunger Vital Sign    Worried About Running Out of Food in the Last Year: Never true    Ran Out of Food in the Last Year: Never true  Transportation Needs: No Transportation Needs (01/02/2024)   PRAPARE - Administrator, Civil Service (Medical): No    Lack of Transportation (Non-Medical): No  Physical Activity: Not on file  Stress: Not on file  Social Connections: Moderately Integrated (01/02/2024)   Social Connection  and Isolation Panel    Frequency of Communication with Friends and Family: More than three times a week    Frequency of Social Gatherings with Friends and Family: More than three times a week    Attends Religious Services: More than 4 times per year    Active Member of [employer] or Organizations: No    Attends Engineer, structural: More than 4 times per year    Marital Status: Widowed  Intimate Partner Violence: Not At Risk (01/02/2024)   Humiliation, Afraid, Rape, and Kick questionnaire    Fear of Current or Ex-Partner: No    Emotionally Abused: No    Physically Abused: No    Sexually Abused: No     Allergies  Allergen Reactions   Allopurinol Rash    Toxic epidermal necrolysis    Codeine Other (See Comments)    REACTION: hallucinations, funny in the head    Levaquin [Levofloxacin] Nausea And Vomiting   Sulfa Antibiotics Hives   Sulfonamide Derivatives Hives     Immunization History  Administered Date(s) Administered   Fluad Quad(high Dose 65+) 06/05/2019   Influenza Split 05/08/2010, 05/10/2011, 05/09/2012, 05/31/2013, 05/26/2020   Influenza Whole 05/08/2010, 05/26/2020   Influenza,inj,Quad PF,6+ Mos 09/14/2015, 04/19/2016, 04/24/2017   Influenza,inj,quad, With Preservative 05/23/2019   Influenza-Unspecified 04/22/2010, 05/09/2012, 08/08/2012, 06/04/2014, 05/14/2020   PFIZER Comirnaty(Gray Top)Covid-19 Tri-Sucrose Vaccine 06/28/2020, 01/31/2021   PFIZER(Purple Top)SARS-COV-2 Vaccination 10/03/2019, 10/25/2019   Pneumococcal Polysaccharide-23 08/09/2007, 07/08/2010   Td 08/08/2008   Td (Adult),5 Lf Tetanus Toxid, Preservative Free 08/08/2008    Outpatient Medications Prior to Visit  Medication Sig Dispense Refill   albuterol  (PROVENTIL  HFA) 108 (90 Base) MCG/ACT inhaler Inhale 2 puffs into the lungs every 6 (six) hours as needed for wheezing or shortness of breath. For shortness of breath and wheezing 18 each 2   Ascorbic Acid  (VITAMIN C PO) Take 1 tablet by mouth  daily.     BIOTIN PO Take 500 mg by mouth daily.     budesonide -formoterol  (SYMBICORT ) 160-4.5 MCG/ACT inhaler Inhale 2 puffs into the lungs 2 (two) times daily. 1 each 6   Carboxymeth-Glyc-Polysorb PF (REFRESH OPTIVE MEGA-3) 0.5-1-0.5 % SOLN Place 1 drop into both eyes daily in the afternoon.     colchicine  0.6 MG tablet Take 0.6 mg by mouth daily as needed (gout flare).     Cyanocobalamin  (B-12 PO) Take 1,000 mcg by mouth daily.     Dexlansoprazole 30 MG capsule DR Take 30 mg by mouth daily as needed (acid reflux).     ezetimibe  (ZETIA ) 10 MG tablet Take 10 mg by mouth daily.     Finerenone  (KERENDIA ) 10 MG TABS Take 10 mg by mouth daily.  fluticasone  (FLONASE ) 50 MCG/ACT nasal spray Place 2 sprays into both nostrils daily. (Patient taking differently: Place 2 sprays into both nostrils daily as needed for allergies.) 18.2 mL 11   furosemide  (LASIX ) 40 MG tablet Take 1 tablet (40 mg total) by mouth daily. In the morning (Patient taking differently: Take 20 mg by mouth 2 (two) times daily.) 90 tablet 3   hydrocortisone  2.5 % cream Apply topically 2 (two) times daily. Apply as needed two times daily 30 g 0   ipratropium-albuterol  (DUONEB) 0.5-2.5 (3) MG/3ML SOLN Take 3 mLs by nebulization every 4 (four) hours as needed. 360 mL 11   isosorbide  mononitrate (IMDUR ) 30 MG 24 hr tablet TAKE 1 TABLET BY MOUTH DAILY 90 tablet 3   JARDIANCE  10 MG TABS tablet Take 10 mg by mouth daily.     Lancets (ONETOUCH DELICA PLUS LANCET33G) MISC Apply 1 each topically 4 (four) times daily.     levothyroxine  (SYNTHROID ) 150 MCG tablet Take 150 mcg by mouth daily before breakfast.     meclizine  (ANTIVERT ) 25 MG tablet Take 25 mg by mouth 3 (three) times daily as needed for dizziness or nausea.     Mepolizumab  (NUCALA ) 100 MG/ML SOAJ Inject 1 mL (100 mg total) into the skin every 28 (twenty-eight) days. 1 mL 1   methocarbamol  (ROBAXIN ) 500 MG tablet Take 1 tablet (500 mg total) by mouth every 6 (six) hours as needed  for muscle spasms. 40 tablet 2   metoprolol  succinate (TOPROL -XL) 25 MG 24 hr tablet TAKE 1 TABLET BY MOUTH DAILY 90 tablet 3   Multiple Vitamin (MULTIVITAMIN WITH MINERALS) TABS tablet Take 1 tablet by mouth daily.     nitroGLYCERIN  (NITROLINGUAL ) 0.4 MG/SPRAY spray Place 1 spray under the tongue every 5 (five) minutes x 3 doses as needed for chest pain. 12 g 5   NOVOFINE PLUS PEN NEEDLE 32G X 4 MM MISC Inject into the skin.     ONE TOUCH ULTRA TEST test strip Use as directed     potassium chloride  (KLOR-CON  M) 10 MEQ tablet TAKE 1 TABLET BY MOUTH DAILY 90 tablet 2   pregabalin  (LYRICA ) 100 MG capsule Take 100 mg by mouth 2 (two) times daily.     Pyridoxine  HCl (B-6 PO) Take 1 tablet by mouth daily.     ranolazine  (RANEXA ) 500 MG 12 hr tablet Take 1 tablet (500 mg total) by mouth 2 (two) times daily. 180 tablet 3   Respiratory Therapy Supplies (FLUTTER) DEVI 1 puff by Does not apply route daily. 1 each 0   rosuvastatin  (CRESTOR ) 20 MG tablet TAKE 1 TABLET BY MOUTH DAILY 90 tablet 3   tirzepatide (MOUNJARO) 5 MG/0.5ML Pen Inject 5 mg into the skin once a week.     triamterene -hydrochlorothiazide  (MAXZIDE -25) 37.5-25 MG tablet TAKE 1 TABLET BY MOUTH DAILY 90 tablet 3   clotrimazole  (MYCELEX ) 10 MG troche Take 1 tablet (10 mg total) by mouth 5 (five) times daily. (Patient not taking: Reported on 12/14/2023) 50 Troche 0   HYDROmorphone  (DILAUDID ) 2 MG tablet Take 0.5-1 tablets (1-2 mg total) by mouth every 4 (four) hours as needed for severe pain (pain score 7-10). 30 tablet 0   polyethylene glycol powder (GLYCOLAX /MIRALAX ) 17 GM/SCOOP powder Take 17 grams dissolved in liquid by mouth 2 (two) times daily. 238 g 0   No facility-administered medications prior to visit.    Review of Systems  Constitutional:  Negative for chills, fever and weight loss.  HENT:  Negative for congestion.  Postnasal drip  Respiratory:  Positive for wheezing. Negative for cough and shortness of breath.    Cardiovascular:  Negative for chest pain and leg swelling.  Gastrointestinal:  Negative for heartburn.  Endo/Heme/Allergies:  Positive for environmental allergies.     Objective:   Vitals:   04/16/24 1130  BP: (!) 104/55  Pulse: 73  SpO2: 96%  Weight: 155 lb (70.3 kg)  Height: 5' 2 (1.575 m)   96% on   RA BMI Readings from Last 3 Encounters:  04/16/24 28.35 kg/m  01/02/24 28.53 kg/m  12/20/23 28.53 kg/m   Wt Readings from Last 3 Encounters:  04/16/24 155 lb (70.3 kg)  01/02/24 156 lb (70.8 kg)  12/20/23 156 lb (70.8 kg)    Physical Exam Vitals reviewed.  Constitutional:      Appearance: Normal appearance. She is not ill-appearing.  HENT:     Head: Normocephalic and atraumatic.     Mouth/Throat:     Mouth: Mucous membranes are moist.  Eyes:     General: No scleral icterus. Cardiovascular:     Rate and Rhythm: Normal rate and regular rhythm.     Heart sounds: No murmur heard.    No friction rub.  Pulmonary:     Effort: No respiratory distress.     Breath sounds: No stridor. No wheezing or rhonchi.  Musculoskeletal:     Cervical back: No rigidity or tenderness.  Lymphadenopathy:     Cervical: No cervical adenopathy.  Skin:    General: Skin is warm.  Neurological:     Mental Status: She is alert.     Motor: No weakness.     Coordination: Coordination normal.  Psychiatric:        Mood and Affect: Mood normal.        Behavior: Behavior normal.    CBC    Component Value Date/Time   WBC 9.5 01/03/2024 0349   RBC 3.33 (L) 01/03/2024 0349   HGB 8.2 (L) 01/03/2024 0349   HGB 10.9 (L) 03/28/2018 1031   HCT 27.3 (L) 01/03/2024 0349   HCT 35.4 03/28/2018 1031   PLT 150 01/03/2024 0349   PLT 312 03/28/2018 1031   MCV 82.0 01/03/2024 0349   MCV 77 (L) 03/28/2018 1031   MCH 24.6 (L) 01/03/2024 0349   MCHC 30.0 01/03/2024 0349   RDW 14.9 01/03/2024 0349   RDW 17.0 (H) 03/28/2018 1031   LYMPHSABS 1.6 11/15/2021 1539   MONOABS 0.7 11/15/2021 1539    EOSABS 0.1 11/15/2021 1539   BASOSABS 0.0 11/15/2021 1539    09/05/2019 labs: IgE 419 Eosinophils 800  02/17/2021-Chem-7 within normal limits-BUN 29, creatinine 1.9, 02/16/2021 CBC within normal limits  Chest Imaging- films reviewed: CXR, 2 view 12/08/2015- C-spine hardware, otherwise normal No recent chest x-ray available  Pulmonary Functions Testing Results:    Latest Ref Rng & Units 12/22/2015    1:49 PM  PFT Results  FVC-Pre L 2.60   FVC-Predicted Pre % 106   FVC-Post L 2.48   FVC-Predicted Post % 102   Pre FEV1/FVC % % 72   Post FEV1/FCV % % 67   FEV1-Pre L 1.87   FEV1-Predicted Pre % 97   FEV1-Post L 1.67   DLCO uncorrected ml/min/mmHg 13.06   DLCO UNC% % 60   DLVA Predicted % 66   TLC L 4.76   TLC % Predicted % 100   RV % Predicted % 118    2017- FVC 3.0 (97%) 3.18 (102%, +6%)  FEV1  2.05 (  87%) 2.14 (91%, +5%) Ratio 68 TLC 106% RV 119% DLCO 62%   Echocardiogram 04/23/2019: LVEF 60 to 65%, mild LVH.  Normal diastolic function.  Normal LA, RV, RA.  Mild AI, otherwise normal valves.    Assessment & Plan:   Severe persistent asthma with elevated IgE levels and eosinophils - On Nucala  - She notices benefit from being on Nucala   Has not been using Symbicort  but notices that she has wheezing that she has to use albuterol  for ACT score of 17 - I did reiterate the importance of being on a controller inhaler as well - She agreed to start using Symbicort  - Encouraged to use Symbicort  with a spacer to reduce the likelihood of developing thrush  Chronic rhinosinusitis - Uses nasal steroids  History of GERD - Continues on antireflux medications  History of Elspeth Louder syndrome -Breathing remains stable -Encouraged to call with any significant concerns with regards to her breathing  Graded activities as tolerated  Follow-up in about 6 months

## 2024-04-17 MED ORDER — TRIAMTERENE-HCTZ 37.5-25 MG PO TABS: 1.0000 | ORAL_TABLET | Freq: Every day | ORAL | 2 refills | Status: AC

## 2024-05-01 ENCOUNTER — Other Ambulatory Visit: Payer: Self-pay

## 2024-05-02 ENCOUNTER — Other Ambulatory Visit: Payer: Self-pay

## 2024-05-06 ENCOUNTER — Other Ambulatory Visit: Payer: Self-pay | Admitting: Pharmacy Technician

## 2024-05-06 ENCOUNTER — Other Ambulatory Visit: Payer: Self-pay

## 2024-05-06 NOTE — Progress Notes (Signed)
 Specialty Pharmacy Refill Coordination Note  [name] is a 68 y.o. female contacted today regarding refills of specialty medication(s) Mepolizumab  (Nucala )   Patient requested Delivery   Delivery date: 05/16/24   Verified address: [address]   [place] [address]   Medication will be filled on 05/15/24. Injection due on 05/18/24.

## 2024-05-15 ENCOUNTER — Other Ambulatory Visit: Payer: Self-pay

## 2024-06-05 ENCOUNTER — Other Ambulatory Visit: Payer: Self-pay

## 2024-06-05 ENCOUNTER — Other Ambulatory Visit: Payer: Self-pay | Admitting: Pulmonary Disease

## 2024-06-05 ENCOUNTER — Other Ambulatory Visit (HOSPITAL_COMMUNITY): Payer: Self-pay

## 2024-06-05 DIAGNOSIS — J4551 Severe persistent asthma with (acute) exacerbation: Secondary | ICD-10-CM

## 2024-06-05 MED ORDER — NUCALA 100 MG/ML ~~LOC~~ SOAJ
100.0000 mg | SUBCUTANEOUS | 5 refills | Status: AC
  Filled 2024-06-05 (×2): qty 1, 28d supply, fill #0
  Filled 2024-07-08: qty 1, 28d supply, fill #1
  Filled 2024-08-05 – 2024-08-07 (×2): qty 1, 28d supply, fill #2
  Filled 2024-08-29: qty 1, 28d supply, fill #3

## 2024-06-05 NOTE — Progress Notes (Signed)
 Specialty Pharmacy Refill Coordination Note  [name] is a 68 y.o. female contacted today regarding refills of specialty medication(s) Mepolizumab  (Nucala )   Patient requested Delivery   Delivery date: 06/18/24   Verified address: [address] [place] [address]   Medication will be filled on: 06/17/24

## 2024-06-05 NOTE — Telephone Encounter (Signed)
 Got it!     Thanks =).

## 2024-06-05 NOTE — Telephone Encounter (Signed)
 Called and spoke to pt regarding her MyChart msg requesting appt with Daneen, NP. She is c/o CP. She states that it is Angina, and she does have hx of this (on Ranexa ); which has worsened, and she fainted 2 weeks ago. Last week she saw her PCP (Dr. Nichole) who advised her to call Cardiology. Pt denies SOB/DOE, dizziness/lightheadedness. The pain radiates to her left arm and feels like very bad indigestion, with no relief from antacids. She did try SL NTG one night and it eased up. She also has recently lost weight and she is not taking her metoprolol  as this causes her BP to drop too low (when she weighs less).   She is not able to make 06/13/24 appt with Daneen, NP as she has a f/u with her Hip doctor. Requesting to see Scot Ford, PA on 06/11/24. Emergency Room precautions reviewed with pt who verbalized understanding. (Has not yet seen Dr. Francyne, but his first available is not until 09/09/2024)

## 2024-06-05 NOTE — Telephone Encounter (Signed)
 Refill sent for NUCALA  to [facility]: [phone]   Dose: 100mg  [place] every 28 days  Last OV: 04/16/24 Provider: Dr. Neda  Next OV: due March 2026, not yet scheduled  [name], PharmD, BCPS Clinical Pharmacist  [facility]

## 2024-06-11 ENCOUNTER — Encounter (HOSPITAL_COMMUNITY): Payer: Self-pay

## 2024-06-11 ENCOUNTER — Emergency Department (HOSPITAL_COMMUNITY)
Admission: EM | Admit: 2024-06-11 | Discharge: 2024-06-11 | Disposition: A | Discharge status: Home / Self Care | Attending: Emergency Medicine | Admitting: Emergency Medicine

## 2024-06-11 ENCOUNTER — Ambulatory Visit: Attending: Physician Assistant | Admitting: Physician Assistant

## 2024-06-11 ENCOUNTER — Other Ambulatory Visit: Payer: Self-pay

## 2024-06-11 ENCOUNTER — Emergency Department (HOSPITAL_COMMUNITY)

## 2024-06-11 VITALS — BP 104/58 | HR 77 | Wt 155.0 lb

## 2024-06-11 DIAGNOSIS — Z79899 Other long term (current) drug therapy: Secondary | ICD-10-CM | POA: Insufficient documentation

## 2024-06-11 DIAGNOSIS — I25709 Atherosclerosis of coronary artery bypass graft(s), unspecified, with unspecified angina pectoris: Secondary | ICD-10-CM

## 2024-06-11 DIAGNOSIS — J45909 Unspecified asthma, uncomplicated: Secondary | ICD-10-CM | POA: Insufficient documentation

## 2024-06-11 DIAGNOSIS — I129 Hypertensive chronic kidney disease with stage 1 through stage 4 chronic kidney disease, or unspecified chronic kidney disease: Secondary | ICD-10-CM | POA: Insufficient documentation

## 2024-06-11 DIAGNOSIS — E785 Hyperlipidemia, unspecified: Secondary | ICD-10-CM | POA: Insufficient documentation

## 2024-06-11 DIAGNOSIS — R072 Precordial pain: Secondary | ICD-10-CM | POA: Diagnosis not present

## 2024-06-11 DIAGNOSIS — N184 Chronic kidney disease, stage 4 (severe): Secondary | ICD-10-CM | POA: Insufficient documentation

## 2024-06-11 DIAGNOSIS — I2510 Atherosclerotic heart disease of native coronary artery without angina pectoris: Secondary | ICD-10-CM | POA: Diagnosis not present

## 2024-06-11 DIAGNOSIS — R55 Syncope and collapse: Secondary | ICD-10-CM

## 2024-06-11 DIAGNOSIS — D649 Anemia, unspecified: Secondary | ICD-10-CM | POA: Diagnosis not present

## 2024-06-11 DIAGNOSIS — E039 Hypothyroidism, unspecified: Secondary | ICD-10-CM | POA: Insufficient documentation

## 2024-06-11 DIAGNOSIS — E1122 Type 2 diabetes mellitus with diabetic chronic kidney disease: Secondary | ICD-10-CM | POA: Diagnosis not present

## 2024-06-11 DIAGNOSIS — Z87891 Personal history of nicotine dependence: Secondary | ICD-10-CM | POA: Diagnosis not present

## 2024-06-11 DIAGNOSIS — R079 Chest pain, unspecified: Secondary | ICD-10-CM | POA: Diagnosis not present

## 2024-06-11 DIAGNOSIS — I2089 Other forms of angina pectoris: Secondary | ICD-10-CM

## 2024-06-11 LAB — BASIC METABOLIC PANEL WITH GFR
Anion gap: 10 (ref 5–15)
BUN: 29 mg/dL — ABNORMAL HIGH (ref 8–23)
CO2: 22 mmol/L (ref 22–32)
Calcium: 8.4 mg/dL — ABNORMAL LOW (ref 8.9–10.3)
Chloride: 103 mmol/L (ref 98–111)
Creatinine, Ser: 1.9 mg/dL — ABNORMAL HIGH (ref 0.44–1.00)
GFR, Estimated: 28 mL/min — ABNORMAL LOW (ref >60)
Glucose, Bld: 111 mg/dL — ABNORMAL HIGH (ref 70–99)
Potassium: 4.3 mmol/L (ref 3.5–5.1)
Sodium: 135 mmol/L (ref 135–145)

## 2024-06-11 LAB — CBC
HCT: 36.2 % (ref 36.0–46.0)
Hemoglobin: 11.1 g/dL — ABNORMAL LOW (ref 12.0–15.0)
MCH: 24.4 pg — ABNORMAL LOW (ref 26.0–34.0)
MCHC: 30.7 g/dL (ref 30.0–36.0)
MCV: 79.7 fL — ABNORMAL LOW (ref 80.0–100.0)
Platelets: 191 K/uL (ref 150–400)
RBC: 4.54 MIL/uL (ref 3.87–5.11)
RDW: 17.1 % — ABNORMAL HIGH (ref 11.5–15.5)
WBC: 7.6 K/uL (ref 4.0–10.5)
nRBC: 0 % (ref 0.0–0.2)

## 2024-06-11 LAB — TROPONIN I (HIGH SENSITIVITY)
Troponin I (High Sensitivity): 12 ng/L (ref <18)
Troponin I (High Sensitivity): 11 ng/L (ref <18)

## 2024-06-11 NOTE — ED Triage Notes (Signed)
 Pt c.o chest pain for a while sob on exertion.

## 2024-06-11 NOTE — Consult Note (Addendum)
 Cardiology Consultation   Patient ID: [name] MRN: [id number]; DOB: 1956/02/21  Admit date: 06/11/2024 Date of Consult: 06/11/2024  PCP:  Nichole Senior, MD   [facility] Providers Cardiologist:  Debby Sor, MD (Inactive)    - plan to set up with Dr. Francyne     Patient Profile: [name] is a 68 y.o. female with a hx of CAD, OSA, hypertension, hyperlipidemia, DM 2, CKD stage IIIb, Elspeth Louder syndrome, severe persistent asthma followed by Dr. Neda and hypothyroidism who is being seen 06/11/2024 for the evaluation of chest pain.  History of Present Illness: [name] is a 68 y.o. female with a hx of CAD, OSA, hypertension, hyperlipidemia, DM 2, CKD stage IIIb, Elspeth Louder syndrome, severe persistent asthma followed by Dr. Neda and hypothyroidism.  Patient underwent DES to proximal RCA in 2007.  She also had concomitant 60 to 70% disease in LAD, 20% disease in left circumflex artery at the time.  Repeat cardiac catheterization in July 2010 showed 60% LAD disease, 20 to 30% left circumflex disease, widely patent RCA stent.  She had a high risk Myoview in 2015 and underwent repeat cardiac catheterization in October 2015 that showed stable anatomy that is unchanged when compared to the previous cath, 50% proximal to mid LAD stenosis with FFR of 0.94.  Echocardiogram in September 2020 showed normal systolic and diastolic function with mild LVH, moderate AI.  She has been followed by Dr. Macel of nephrology service who started her on Kerendia  for renal protection.  Echocardiogram obtained in December 2024 showed EF 60 to 65%, grade 1 DD, mild AI.  Patient was last seen in April 2025 by Dr. Sor.  She did not have any chest discomfort at the time however blood pressure was low.  Lasix  was decreased from 40 mg a.m. and 20 mg p.m. to 40 mg a.m. only.  Metoprolol  succinate was also reduced from 25 mg daily to alternating 25 mg and to 12.5 mg every other day.   Per report, patient has stopped her CPAP for the past 4 years.  She underwent right total hip replacement by Dr. Ernie in May 2025.  Per recent telephone note, patient reportedly fainted 2 weeks ago.  She is also having worsening anginal symptom.  She called her PCP who instructed her to call cardiology service.  Of note, prior to Dr. Joesphine retirement, he has suggested to the patient to follow-up with Dr. Francyne.   Patient presents today for evaluation of chest discomfort that has been worsening for the past month.  Symptom is also accompanied by worsening dyspnea on exertion as well.  She appears to be euvolemic on exam.  She says the chest discomfort can last several hours at a time and improved with nitroglycerin .  This is reminiscent of the previous angina back in 2007.  Last cardiac catheterization in 2015 showed a stable coronary anatomy.  She also mention she passed out roughly 2 weeks ago.  Although metoprolol  succinate is listed on her medication list, she has not been taking it very consistently, she uses her weight is the cutoff.  If her weight dropped down to the 140s, she would not take the metoprolol .  She would only take the metoprolol  if her weight is in the 150s.  I asked her to use her blood pressure is the cutoff regarding metoprolol  usage, she has been instructed to hold metoprolol  succinate if systolic blood pressure is less than 110.  I also want to discontinue her  Maxide to improve her blood pressure.  EKG today showed no significant ST-T wave changes.  However her symptom is very concerning for unstable angina.  I discussed the case with DOD Dr. Jeffrie who recommended sending the patient to the ED.  Given her baseline renal function and a history of anemia, we recommend she is admitted by hospitalist and cardiology service to consult.  Depend on the renal function and hemoglobin, may consider definitive angiography in the next few days.   Past Medical History:  Diagnosis Date    Acute bronchitis    Anemia    hx of anemia   Anginal pain    Arthritis    osteo   Asthma    Chronic kidney disease (CKD), stage IV (severe) (HCC)    COPD (chronic obstructive pulmonary disease) (HCC)    Coronary atherosclerosis    Cough    GERD (gastroesophageal reflux disease)    History of colon polyps    History of migraine    Hyperlipidemia    Hypertension    Obstructive sleep apnea (adult) (pediatric)    NO CPAP    Peripheral neuropathy    Type II or unspecified type diabetes mellitus without mention of complication, not stated as uncontrolled    type 2   Unspecified hypothyroidism     Past Surgical History:  Procedure Laterality Date   ANTERIOR FUSION CERVICAL SPINE  2009   CARPAL TUNNEL RELEASE Left    COLONOSCOPY     CORONARY STENT INTERVENTION  2007   LEFT HEART CATHETERIZATION WITH CORONARY ANGIOGRAM N/A 05/29/2014   Procedure: LEFT HEART CATHETERIZATION WITH CORONARY ANGIOGRAM;  Surgeon: Victory LELON Claudene DOUGLAS, MD;  Location: [facility] CATH LAB;  Service: Cardiovascular;  Laterality: N/A;   TOTAL HIP ARTHROPLASTY Right 01/02/2024   Procedure: ARTHROPLASTY, HIP, TOTAL, ANTERIOR APPROACH;  Surgeon: Ernie Cough, MD;  Location: [facility] ORS;  Service: Orthopedics;  Laterality: Right;   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   Procedure: TOTAL KNEE ARTHROPLASTY;  Surgeon: Duwayne Purchase, MD;  Location: [facility] ORS;  Service: Orthopedics;  Laterality: Left;  3 hrs   TOTAL KNEE ARTHROPLASTY Right 10/01/2021   Procedure: TOTAL KNEE ARTHROPLASTY;  Surgeon: Duwayne Purchase, MD;  Location: [facility] ORS;  Service: Orthopedics;  Laterality: Right;   TUBAL LIGATION  1990   WISDOM TOOTH EXTRACTION       Home Medications:  Prior to Admission medications   Medication Sig Start Date End Date Taking? Authorizing Provider  albuterol  (PROVENTIL  HFA) 108 (90 Base) MCG/ACT inhaler Inhale 2 puffs into the lungs every 6 (six) hours as needed for wheezing or shortness of breath. For shortness of breath and wheezing  11/27/20   Neda Hammond A, MD  Ascorbic Acid  (VITAMIN C PO) Take 1 tablet by mouth daily.    [provider]  BIOTIN PO Take 500 mg by mouth daily.    [provider]  budesonide -formoterol  (SYMBICORT ) 160-4.5 MCG/ACT inhaler Inhale 2 puffs into the lungs 2 (two) times daily. 09/15/23   Olalere, Hammond LABOR, MD  Carboxymeth-Glyc-Polysorb PF (REFRESH OPTIVE MEGA-3) 0.5-1-0.5 % SOLN Place 1 drop into both eyes daily in the afternoon. 05/26/22   [provider]  clotrimazole  (MYCELEX ) 10 MG troche Take 1 tablet (10 mg total) by mouth 5 (five) times daily. Patient not taking: Reported on 12/14/2023 06/19/23   Neda Hammond LABOR, MD  colchicine  0.6 MG tablet Take 0.6 mg by mouth daily as needed (gout flare).    [provider]  Cyanocobalamin  (B-12 PO) Take  1,000 mcg by mouth daily.    [provider]  Dexlansoprazole 30 MG capsule DR Take 30 mg by mouth daily as needed (acid reflux). 05/29/21   [provider]  ezetimibe  (ZETIA ) 10 MG tablet Take 10 mg by mouth daily.    [provider]  Finerenone  (KERENDIA ) 10 MG TABS Take 10 mg by mouth daily.    [provider]  fluticasone  (FLONASE ) 50 MCG/ACT nasal spray Place 2 sprays into both nostrils daily. Patient taking differently: Place 2 sprays into both nostrils daily as needed for allergies. 09/05/19   Gretta Leita SQUIBB, DO  furosemide  (LASIX ) 40 MG tablet Take 1 tablet (40 mg total) by mouth daily. In the morning Patient taking differently: Take 20 mg by mouth 2 (two) times daily. 01/20/21   Burnard Debby LABOR, MD  hydrocortisone  2.5 % cream Apply topically 2 (two) times daily. Apply as needed two times daily 10/02/21   McClung, Kevan D, PA  HYDROmorphone  (DILAUDID ) 2 MG tablet Take 0.5-1 tablets (1-2 mg total) by mouth every 4 (four) hours as needed for severe pain (pain score 7-10). 01/03/24   Patti Rosina SAUNDERS, PA-C  ipratropium-albuterol  (DUONEB) 0.5-2.5 (3) MG/3ML SOLN Take 3 mLs by  nebulization every 4 (four) hours as needed. 11/19/20   Parrett, Madelin RAMAN, NP  isosorbide  mononitrate (IMDUR ) 30 MG 24 hr tablet TAKE 1 TABLET BY MOUTH DAILY 01/17/24   Burnard Debby LABOR, MD  JARDIANCE  10 MG TABS tablet Take 10 mg by mouth daily. 06/24/20   [provider]  Lancets (ONETOUCH DELICA PLUS [place]) MISC Apply 1 each topically 4 (four) times daily. 06/24/20   [provider]  levothyroxine  (SYNTHROID ) 150 MCG tablet Take 150 mcg by mouth daily before breakfast.    [provider]  meclizine  (ANTIVERT ) 25 MG tablet Take 25 mg by mouth 3 (three) times daily as needed for dizziness or nausea.    [provider]  Mepolizumab  (NUCALA ) 100 MG/ML SOAJ Inject 1 mL (100 mg total) into the skin every 28 (twenty-eight) days. 06/05/24   Neda Jennet LABOR, MD  methocarbamol  (ROBAXIN ) 500 MG tablet Take 1 tablet (500 mg total) by mouth every 6 (six) hours as needed for muscle spasms. 01/03/24   Patti Rosina SAUNDERS, PA-C  metoprolol  succinate (TOPROL -XL) 25 MG 24 hr tablet TAKE 1 TABLET BY MOUTH DAILY 01/03/24   Burnard Debby LABOR, MD  Multiple Vitamin (MULTIVITAMIN WITH MINERALS) TABS tablet Take 1 tablet by mouth daily.    [provider]  nitroGLYCERIN  (NITROLINGUAL ) 0.4 MG/SPRAY spray Place 1 spray under the tongue every 5 (five) minutes x 3 doses as needed for chest pain. 11/14/22   Daneen Damien BROCKS, NP  NOVOFINE PLUS PEN NEEDLE 32G X 4 MM MISC Inject into the skin. 06/24/20   [provider]  ONE TOUCH ULTRA TEST test strip Use as directed 05/05/11   [provider]  polyethylene glycol powder (GLYCOLAX /MIRALAX ) 17 GM/SCOOP powder Take 17 grams dissolved in liquid by mouth 2 (two) times daily. 01/03/24   Patti Rosina SAUNDERS, PA-C  potassium chloride  (KLOR-CON  M) 10 MEQ tablet TAKE 1 TABLET BY MOUTH DAILY 02/12/24   Daneen Damien BROCKS, NP  pregabalin  (LYRICA ) 100 MG capsule Take 100 mg by mouth 2 (two) times daily.    [provider]  Pyridoxine  HCl  (B-6 PO) Take 1 tablet by mouth daily.    [provider]  ranolazine  (RANEXA ) 500 MG 12 hr tablet Take 1 tablet (500 mg total) by mouth  2 (two) times daily. 12/27/23   Burnard Debby LABOR, MD  Respiratory Therapy Supplies (FLUTTER) DEVI 1 puff by Does not apply route daily. 10/01/19   Gretta Leita SQUIBB, DO  rosuvastatin  (CRESTOR ) 20 MG tablet TAKE 1 TABLET BY MOUTH DAILY 06/08/23   Burnard Debby LABOR, MD  tirzepatide [facility]) 5 MG/0.5ML Pen Inject 5 mg into the skin once a week. 05/25/21   [provider]  triamterene -hydrochlorothiazide  (MAXZIDE -25) 37.5-25 MG tablet Take 1 tablet by mouth daily. 04/17/24   Daneen Damien BROCKS, NP    Scheduled Meds:  Continuous Infusions:  PRN Meds:   Allergies:    Allergies  Allergen Reactions   Allopurinol Rash    Toxic epidermal necrolysis    Codeine Other (See Comments)    REACTION: hallucinations, funny in the head    Levaquin [Levofloxacin] Nausea And Vomiting   Sulfa Antibiotics Hives   Sulfonamide Derivatives Hives    Social History:   Social History   Socioeconomic History   Marital status: Widowed    Spouse name: Not on file   Number of children: 0   Years of education: Not on file   Highest education level: Not on file  Occupational History   Occupation: retired  Tobacco Use   Smoking status: Former    Current packs/day: 0.00    Average packs/day: 0.5 packs/day for 25.0 years (12.5 ttl pk-yrs)    Types: Cigarettes    Start date: 08/08/1978    Quit date: 08/09/2003    Years since quitting: 20.8   Smokeless tobacco: Never  Vaping Use   Vaping status: Never Used  Substance and Sexual Activity   Alcohol  use: Not Currently   Drug use: No   Sexual activity: Not on file  Other Topics Concern   Not on file  Social History Narrative   Not on file   Social Drivers of Health   Financial Resource Strain: Not on file  Food Insecurity: No Food Insecurity (01/02/2024)   Hunger Vital Sign    Worried About Running Out of Food  in the Last Year: Never true    Ran Out of Food in the Last Year: Never true  Transportation Needs: No Transportation Needs (01/02/2024)   PRAPARE - Administrator, Civil Service (Medical): No    Lack of Transportation (Non-Medical): No  Physical Activity: Not on file  Stress: Not on file  Social Connections: Moderately Integrated (01/02/2024)   Social Connection and Isolation Panel    Frequency of Communication with Friends and Family: More than three times a week    Frequency of Social Gatherings with Friends and Family: More than three times a week    Attends Religious Services: More than 4 times per year    Active Member of [employer] or Organizations: No    Attends Engineer, Structural: More than 4 times per year    Marital Status: Widowed  Intimate Partner Violence: Not At Risk (01/02/2024)   Humiliation, Afraid, Rape, and Kick questionnaire    Fear of Current or Ex-Partner: No    Emotionally Abused: No    Physically Abused: No    Sexually Abused: No    Family History:    Family History  Problem Relation Age of Onset   Hypertension Mother    Heart disease Mother    Stroke Mother    Prostate cancer Father    Heart disease Brother    Heart attack Sister      ROS:  Please see  the history of present illness.   All other ROS reviewed and negative.     Physical Exam/Data: Vitals:   06/11/24 1631  BP: 114/66  Pulse: 72  Resp: 18  Temp: 97.6 F (36.4 C)  TempSrc: Oral  SpO2: 100%   No intake or output data in the 24 hours ending 06/11/24 1704    06/11/2024    3:10 PM 04/16/2024   11:30 AM 01/02/2024    1:28 PM  Last 3 Weights  Weight (lbs) 155 lb 155 lb 156 lb  Weight (kg) 70.308 kg 70.308 kg 70.761 kg     There is no height or weight on file to calculate BMI.  General:  Well nourished, well developed, in no acute distress HEENT: normal Neck: no JVD Vascular: No carotid bruits; Distal pulses 2+ bilaterally Cardiac:  normal S1, S2; RRR; no murmur   Lungs:  clear to auscultation bilaterally, no wheezing, rhonchi or rales  Abd: soft, nontender, no hepatomegaly  Ext: no edema Musculoskeletal:  No deformities, BUE and BLE strength normal and equal Skin: warm and dry  Neuro:  CNs 2-12 intact, no focal abnormalities noted Psych:  Normal affect   EKG:  The EKG was personally reviewed and demonstrates: Normal sinus rhythm, no significant ST-T wave changes Telemetry:  Telemetry was personally reviewed and demonstrates:  N/A  Relevant CV Studies:  Cath 05/29/2014 PROCEDURE: 1. Left heart catheterization; 2. Coronary angiography; 3. Left ventriculography; 4. FFR LAD   CONSENT:  The risks, benefits, and details of the procedure were explained in detail to the patient. Risks including death, stroke, heart attack, kidney injury, allergy , limb ischemia, bleeding and radiation injury were discussed.  The patient verbalized understanding and wanted to proceed.  Informed written consent was obtained.   PROCEDURE TECHNIQUE:  After Xylocaine  anesthesia a 5 French Slender sheath was placed in the right radial artery with an angiocath and the modified Seldinger technique.  Coronary angiography was done using a 5 F JR 4, JL 3.5 cm, and 3.0 cm EBU (Guide) catheter.  Left ventriculography was done using the JR 4 catheter and hand injection.    Review of the images demonstrated anatomy very similar to that noted at the time of last catheterization performed by Dr. Burnard in 2010. The right coronary stent is widely patent. There is a segmental eccentric 50-60% proximal to mid LAD stenosis. Circumflex is widely patent with minimal irregularities noted in the distal vessel. Because of the abnormalities noted on the nuclear perfusion study, FFR was performed of the LAD lesion. This was done after adequate anticoagulation he get an ACT greater than 250. FFR was noted to be 0.94. The case was then terminated. Hemostasis was achieved with a Vasc Band. No complications  occurred. The patient did develop significant dyspnea with adenosine  infusion during the FFR protocol.   CONTRAST:  Total of 100 cc.   COMPLICATIONS:  Dyspnea during adenosine  infusion    HEMODYNAMICS:  Aortic pressure 159/56 mmHg; LV pressure 159/9 mmHg; LVEDP 18 mm mercury   ANGIOGRAPHIC DATA:   The left main coronary artery is widely patent.   The left anterior descending artery is moderately calcified in the proximal and mid segment there is an eccentric shelflike region of narrowing extending from the first of perforator to the mid vessel. This is similar to prior angiographic appearance with 50% narrowing. Scattered irregularities are noted further distally and near the apex. The first diagonal contains eccentric 70% narrowing. The territory supplied a small..   The left  circumflex artery is a large vessel. It gives origin to 3 obtuse marginal branches. Eccentric narrowing is noted in the circumflex beyond the first obtuse marginal that obstructs the vessel by up to 30%. Further distally beyond the origin of the second obtuse marginal there is 40% narrowing. The proximal portion of the second obtuse marginal contains 30-40% narrowing..   The right coronary artery is widely patent including the stent that is present in the proximal segment. Small PDA arises.   PCI RESULTS: As described above, PCI was not performed. FFR of the proximal to mid segmental LAD stenosis was 0.94.   LEFT VENTRICULOGRAM:  Left ventricular angiogram was done in the 30 RAO projection and revealed a normal cavity size with estimated ejection fraction of 60%.     IMPRESSIONS:  1. Widely patent right coronary stent 2. Segmental 50% proximal to mid LAD stenosis with FFR of 0.94. The angiographic appearance is unchanged from 2010. 3. False positive myocardial perfusion study 4. Normal left ventricular function 5. Irregularities noted in the circumflex       RECOMMENDATION:  Continued medical therapy, which  include aggressive risk factor modification. There is no indication for intervention on the LAD which supplies a territory that caused the appearance of ischemia on nuclear scintigraphy. It would appear this time that the study was false positive. Other explanations might include vasovagal reactive blood flow disturbance during the procedure with coronary spasm being induced. In the cath lab adenosine  perfusion did not demonstrate evidence of hemodynamic significance in the proximal to mid LAD stenosis.    Echo 07/24/2023  1. Left ventricular ejection fraction, by estimation, is 60 to 65%. The  left ventricle has normal function. The left ventricle has no regional  wall motion abnormalities. Left ventricular diastolic parameters are  consistent with Grade I diastolic  dysfunction (impaired relaxation). Elevated left atrial pressure. The  average left ventricular global longitudinal strain is -21.8 %. The global  longitudinal strain is normal.   2. Right ventricular systolic function is normal. The right ventricular  size is normal.   3. The mitral valve is normal in structure. No evidence of mitral valve  regurgitation. No evidence of mitral stenosis.   4. The aortic valve has an indeterminant number of cusps. Aortic valve  regurgitation is mild. No aortic stenosis is present.   5. The inferior vena cava is normal in size with greater than 50%  respiratory variability, suggesting right atrial pressure of 3 mmHg.     Laboratory Data: High Sensitivity Troponin:  No results for input(s): TROPONINIHS in the last 720 hours.   ChemistryNo results for input(s): NA, K, CL, CO2, GLUCOSE, BUN, CREATININE, CALCIUM , MG, GFRNONAA, GFRAA, ANIONGAP in the last 168 hours.  No results for input(s): PROT, ALBUMIN , AST, ALT, ALKPHOS, BILITOT in the last 168 hours. Lipids No results for input(s): CHOL, TRIG, HDL, LABVLDL, LDLCALC, CHOLHDL in the last 168  hours.  HematologyNo results for input(s): WBC, RBC, HGB, HCT, MCV, MCH, MCHC, RDW, PLT in the last 168 hours. Thyroid  No results for input(s): TSH, FREET4 in the last 168 hours.  BNPNo results for input(s): BNP, PROBNP in the last 168 hours.  DDimer No results for input(s): DDIMER in the last 168 hours.  Radiology/Studies:  No results found.   Assessment and Plan:  Chest pain: Symptoms concerning for unstable angina.  Is reminiscent of the previous anginal symptom.  Will send the patient to the emergency room for further evaluation.  Patient appears to be stable, discussed  with MD, she is to go to ED via private vehicle.  I have messaged cardiology staff, recommend admit by hospitalist service.  Cardiology service to evaluate the patient in the hospital and decide whether or not to proceed with coronary angiography in the next few days depending on her renal function and blood work.             - If decided to proceed with cardiac catheterization, will need to discuss with her benefit and risk of the procedure by hospital staff.   Syncope: Occurred 2 weeks ago.  Likely due to low blood pressure.  I recommend she discontinue Maxide.  He has not been taking metoprolol  succinate consistently in the past few months due to fear of dropping her blood pressure.  With the discontinuation of Maxide, I am hoping that her systolic blood pressure will be above 110 so she can resume metoprolol  succinate for antianginal purpose   CAD: Patient had stent to the RCA in 2007.  She underwent relook cardiac catheterization in 2010 and again in 2015 that showed stable coronary anatomy.  2015 cardiac catheterization was performed as result of positive stress test, the stress test was false positive.   Anemia: Last hemoglobin was 8.2 today after her hip replacement surgery.  Will need to repeat blood work to make sure her anemia improved.     Hypertension: Discontinue Maxide.  Okay to  continue metoprolol  succinate if systolic blood pressure is greater than 110.   Hyperlipidemia: On Zetia  and rosuvastatin .   DM2: Per primary   CKD stage III: Followed by Dr. Macel of [facility].  With recent syncope, will need repeat blood work.   Risk Assessment/Risk Scores:    TIMI Risk Score for Unstable Angina or Non-ST Elevation MI:   The patient's TIMI risk score is 5, which indicates a 26% risk of all cause mortality, new or recurrent myocardial infarction or need for urgent revascularization in the next 14 days.       For questions or updates, please contact [facility] Please consult [email or web address] for contact info under    Signed, Scot Ford, [place]  06/11/2024 5:04 PM  Personally seen and examined. Agree with above.  68 year old female with known coronary artery disease prior RCA stent here with ongoing chest discomfort intermittent/worsening over the past several weeks.  She feels as though this was similar to what she had prior to her RCA stenting.  She feels more short of breath with exertion.  In discussing with her she appeared fairly comfortable in no acute distress.  Lungs were clear heart regular rate and rhythm.  She has had some issues with anemia in the past and chronic kidney disease stage III AA as well.  2015 was her last cardiac catheterization which showed widely patent stent.  EF was normal on echocardiogram 2024  EKG has nonspecific ST-T wave changes.  Chest pain with known CAD right coronary artery stent with recent anemia hypertension hyperlipidemia diabetes chronic kidney disease stage III AA followed by [facility] with syncope likely secondary to hypotension.  -We discontinued her Maxide -Sending to ER for blood work and evaluation with troponin etc. -Prior nuclear stress test was positive in 2015 which resulted in cardiac catheterization which showed no evidence of flow-limiting disease.  Stent was patent. -May need  coronary angiography, dye sparing as best as possible for further evaluation.  Oneil Parchment, MD

## 2024-06-11 NOTE — Patient Instructions (Addendum)
 Medication Instructions:  Your physician recommends that you continue on your current medications as directed. Please refer to the Current Medication list given to you today.  *If you need a refill on your cardiac medications before your next appointment, please call your pharmacy*  Lab Work: NONE ordered at this time of appointment   Testing/Procedures: NONE ordered at this time of appointment    Follow-Up: At [facility], you and your health needs are our priority.  As part of our continuing mission to provide you with exceptional heart care, our providers are all part of one team.  This team includes your primary Cardiologist (physician) and Advanced Practice Providers or APPs (Physician Assistants and Nurse Practitioners) who all work together to provide you with the care you need, when you need it.  Your next appointment:   3-4 week f/u Damien Braver NP or Scot Ford PA 4 months ov with Dr. Pietro   We recommend signing up for the patient portal called MyChart.  Sign up information is provided on this After Visit Summary.  MyChart is used to connect with patients for Virtual Visits (Telemedicine).  Patients are able to view lab/test results, encounter notes, upcoming appointments, etc.  Non-urgent messages can be sent to your provider as well.   To learn more about what you can do with MyChart, go to [email or web address].   Other Instructions Proceed to ED

## 2024-06-11 NOTE — ED Triage Notes (Signed)
 Pt sent for from heart center for further evaluation; pt states feel like indigestion; hx of same

## 2024-06-11 NOTE — ED Provider Notes (Signed)
 [place] EMERGENCY DEPARTMENT AT [facility] Provider Note  CSN: [id number] Arrival date & time: 06/11/24 1627  Chief Complaint(s) Chest Pain  HPI [name] is a 68 y.o. female who is here today for angina which has been intermittently occurring over the last 1 month.  Patient was at her outpatient cardiology appointment today, and her story is concerning for unstable angina.  Her cardiology team sent her to the ED with a plan for admission.   Past Medical History Past Medical History:  Diagnosis Date   Acute bronchitis    Anemia    hx of anemia   Anginal pain    Arthritis    osteo   Asthma    Chronic kidney disease (CKD), stage IV (severe) (HCC)    COPD (chronic obstructive pulmonary disease) (HCC)    Coronary atherosclerosis    Cough    GERD (gastroesophageal reflux disease)    History of colon polyps    History of migraine    Hyperlipidemia    Hypertension    Obstructive sleep apnea (adult) (pediatric)    NO CPAP    Peripheral neuropathy    Type II or unspecified type diabetes mellitus without mention of complication, not stated as uncontrolled    type 2   Unspecified hypothyroidism    Patient Active Problem List   Diagnosis Date Noted   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   Severe persistent asthma (HCC) 10/05/2020   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   Allergic reaction 04/26/2020   Acute renal failure superimposed on stage 3 chronic kidney disease (HCC) 04/26/2020   Stomatitis 04/26/2020   Allergic reaction caused by a drug 04/26/2020   Rash    Left knee DJD 02/05/2020   Acute bronchitis 08/10/2014   Unstable angina (HCC) 05/29/2014   Abnormal nuclear stress test 05/29/2014   Chest pain 05/06/2014   Exertional shortness of breath 05/06/2014   Mixed hyperlipidemia 05/06/2014   Hyperlipidemia with target LDL less than 70 08/05/2013   HTN (hypertension) 08/05/2013   Chest wall pain 08/05/2013    SINUSITIS, ACUTE 04/07/2009   HYPOTHYROIDISM 12/10/2007   Type 2 diabetes mellitus with hemoglobin A1c goal of less than 7.0% (HCC) 05/28/2007   Obstructive sleep apnea 05/28/2007   Coronary atherosclerosis 05/28/2007   Seasonal and perennial allergic rhinitis 05/28/2007   COUGH, CHRONIC 05/28/2007   Home Medication(s) Prior to Admission medications   Medication Sig Start Date End Date Taking? Authorizing Provider  albuterol  (PROVENTIL  HFA) 108 (90 Base) MCG/ACT inhaler Inhale 2 puffs into the lungs every 6 (six) hours as needed for wheezing or shortness of breath. For shortness of breath and wheezing 11/27/20   Neda Hammond A, MD  Ascorbic Acid  (VITAMIN C PO) Take 1 tablet by mouth daily.    [provider]  BIOTIN PO Take 500 mg by mouth daily.    [provider]  budesonide -formoterol  (SYMBICORT ) 160-4.5 MCG/ACT inhaler Inhale 2 puffs into the lungs 2 (two) times daily. 09/15/23   Olalere, Hammond LABOR, MD  Carboxymeth-Glyc-Polysorb PF (REFRESH OPTIVE MEGA-3) 0.5-1-0.5 % SOLN Place 1 drop into both eyes daily in the afternoon. 05/26/22   [provider]  clotrimazole  (MYCELEX ) 10 MG troche Take 1 tablet (10 mg total) by mouth 5 (five) times daily. Patient not taking: Reported on 12/14/2023 06/19/23   Neda Hammond LABOR, MD  colchicine  0.6 MG tablet Take 0.6 mg by mouth daily as needed (gout flare).  [provider]  Cyanocobalamin  (B-12 PO) Take 1,000 mcg by mouth daily.    [provider]  Dexlansoprazole 30 MG capsule DR Take 30 mg by mouth daily as needed (acid reflux). 05/29/21   [provider]  ezetimibe  (ZETIA ) 10 MG tablet Take 10 mg by mouth daily.    [provider]  Finerenone  (KERENDIA ) 10 MG TABS Take 10 mg by mouth daily.    [provider]  fluticasone  (FLONASE ) 50 MCG/ACT nasal spray Place 2 sprays into both nostrils daily. Patient taking differently: Place 2 sprays into both nostrils daily as needed  for allergies. 09/05/19   Gretta Leita SQUIBB, DO  furosemide  (LASIX ) 40 MG tablet Take 1 tablet (40 mg total) by mouth daily. In the morning Patient taking differently: Take 20 mg by mouth 2 (two) times daily. 01/20/21   Burnard Debby LABOR, MD  hydrocortisone  2.5 % cream Apply topically 2 (two) times daily. Apply as needed two times daily 10/02/21   McClung, Kevan D, PA  HYDROmorphone  (DILAUDID ) 2 MG tablet Take 0.5-1 tablets (1-2 mg total) by mouth every 4 (four) hours as needed for severe pain (pain score 7-10). 01/03/24   Patti Rosina SAUNDERS, PA-C  ipratropium-albuterol  (DUONEB) 0.5-2.5 (3) MG/3ML SOLN Take 3 mLs by nebulization every 4 (four) hours as needed. 11/19/20   Parrett, Madelin RAMAN, NP  isosorbide  mononitrate (IMDUR ) 30 MG 24 hr tablet TAKE 1 TABLET BY MOUTH DAILY 01/17/24   Burnard Debby LABOR, MD  JARDIANCE  10 MG TABS tablet Take 10 mg by mouth daily. 06/24/20   [provider]  Lancets (ONETOUCH DELICA PLUS Friedens) MISC Apply 1 each topically 4 (four) times daily. 06/24/20   [provider]  levothyroxine  (SYNTHROID ) 150 MCG tablet Take 150 mcg by mouth daily before breakfast.    [provider]  meclizine  (ANTIVERT ) 25 MG tablet Take 25 mg by mouth 3 (three) times daily as needed for dizziness or nausea.    [provider]  Mepolizumab  (NUCALA ) 100 MG/ML SOAJ Inject 1 mL (100 mg total) into the skin every 28 (twenty-eight) days. 06/05/24   Neda Jennet LABOR, MD  methocarbamol  (ROBAXIN ) 500 MG tablet Take 1 tablet (500 mg total) by mouth every 6 (six) hours as needed for muscle spasms. 01/03/24   Patti Rosina SAUNDERS, PA-C  metoprolol  succinate (TOPROL -XL) 25 MG 24 hr tablet TAKE 1 TABLET BY MOUTH DAILY 01/03/24   Burnard Debby LABOR, MD  Multiple Vitamin (MULTIVITAMIN WITH MINERALS) TABS tablet Take 1 tablet by mouth daily.    [provider]  nitroGLYCERIN  (NITROLINGUAL ) 0.4 MG/SPRAY spray Place 1 spray under the tongue every 5 (five) minutes x 3 doses as needed for  chest pain. 11/14/22   Daneen Damien BROCKS, NP  NOVOFINE PLUS PEN NEEDLE 32G X 4 MM MISC Inject into the skin. 06/24/20   [provider]  ONE TOUCH ULTRA TEST test strip Use as directed 05/05/11   [provider]  polyethylene glycol powder (GLYCOLAX /MIRALAX ) 17 GM/SCOOP powder Take 17 grams dissolved in liquid by mouth 2 (two) times daily. 01/03/24   Patti Rosina SAUNDERS, PA-C  potassium chloride  (KLOR-CON  M) 10 MEQ tablet TAKE 1 TABLET BY MOUTH DAILY 02/12/24   Daneen Damien BROCKS, NP  pregabalin  (LYRICA ) 100 MG capsule Take 100 mg by mouth 2 (two) times daily.    [provider]  Pyridoxine  HCl (B-6 PO) Take 1 tablet by mouth daily.    [provider]  ranolazine  (RANEXA ) 500 MG 12 hr tablet  Take 1 tablet (500 mg total) by mouth 2 (two) times daily. 12/27/23   Burnard Debby LABOR, MD  Respiratory Therapy Supplies (FLUTTER) DEVI 1 puff by Does not apply route daily. 10/01/19   Gretta Leita SQUIBB, DO  rosuvastatin  (CRESTOR ) 20 MG tablet TAKE 1 TABLET BY MOUTH DAILY 06/08/23   Burnard Debby LABOR, MD  tirzepatide [facility]) 5 MG/0.5ML Pen Inject 5 mg into the skin once a week. 05/25/21   [provider]  triamterene -hydrochlorothiazide  (MAXZIDE -25) 37.5-25 MG tablet Take 1 tablet by mouth daily. 04/17/24   Daneen Damien BROCKS, NP                                                                                                                                    Past Surgical History Past Surgical History:  Procedure Laterality Date   ANTERIOR FUSION CERVICAL SPINE  2009   CARPAL TUNNEL RELEASE Left    COLONOSCOPY     CORONARY STENT INTERVENTION  2007   LEFT HEART CATHETERIZATION WITH CORONARY ANGIOGRAM N/A 05/29/2014   Procedure: LEFT HEART CATHETERIZATION WITH CORONARY ANGIOGRAM;  Surgeon: Victory LELON Claudene DOUGLAS, MD;  Location: [facility] CATH LAB;  Service: Cardiovascular;  Laterality: N/A;   TOTAL HIP ARTHROPLASTY Right 01/02/2024   Procedure: ARTHROPLASTY, HIP, TOTAL, ANTERIOR APPROACH;  Surgeon:  Ernie Cough, MD;  Location: [facility] ORS;  Service: Orthopedics;  Laterality: Right;   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   Procedure: TOTAL KNEE ARTHROPLASTY;  Surgeon: Duwayne Purchase, MD;  Location: [facility] ORS;  Service: Orthopedics;  Laterality: Left;  3 hrs   TOTAL KNEE ARTHROPLASTY Right 10/01/2021   Procedure: TOTAL KNEE ARTHROPLASTY;  Surgeon: Duwayne Purchase, MD;  Location: [facility] ORS;  Service: Orthopedics;  Laterality: Right;   TUBAL LIGATION  1990   WISDOM TOOTH EXTRACTION     Family History Family History  Problem Relation Age of Onset   Hypertension Mother    Heart disease Mother    Stroke Mother    Prostate cancer Father    Heart disease Brother    Heart attack Sister     Social History Social History   Tobacco Use   Smoking status: Former    Current packs/day: 0.00    Average packs/day: 0.5 packs/day for 25.0 years (12.5 ttl pk-yrs)    Types: Cigarettes    Start date: 08/08/1978    Quit date: 08/09/2003    Years since quitting: 20.8   Smokeless tobacco: Never  Vaping Use   Vaping status: Never Used  Substance Use Topics   Alcohol  use: Not Currently   Drug use: No   Allergies Allopurinol, Codeine, Levaquin [levofloxacin], Sulfa antibiotics, and Sulfonamide derivatives  Review of Systems Review of Systems  Physical Exam Vital Signs  I have reviewed the triage vital signs BP 137/73   Pulse 62   Temp 98 F (36.7 C)   Resp 18   SpO2 99%   Physical Exam Vitals and  nursing note reviewed.  HENT:     Head: Normocephalic.  Cardiovascular:     Rate and Rhythm: Normal rate.     Heart sounds: Normal heart sounds.  Pulmonary:     Effort: Pulmonary effort is normal.     Breath sounds: Normal breath sounds.  Abdominal:     Palpations: Abdomen is soft.  Musculoskeletal:        General: Normal range of motion.  Neurological:     General: No focal deficit present.     Mental Status: She is alert.     ED Results and Treatments Labs (all labs ordered are  listed, but only abnormal results are displayed) Labs Reviewed  BASIC METABOLIC PANEL WITH GFR - Abnormal; Notable for the following components:      Result Value   Glucose, Bld 111 (*)    BUN 29 (*)    Creatinine, Ser 1.90 (*)    Calcium  8.4 (*)    GFR, Estimated 28 (*)    All other components within normal limits  CBC - Abnormal; Notable for the following components:   Hemoglobin 11.1 (*)    MCV 79.7 (*)    MCH 24.4 (*)    RDW 17.1 (*)    All other components within normal limits  TROPONIN I (HIGH SENSITIVITY)  TROPONIN I (HIGH SENSITIVITY)                                                                                                                          Radiology DG Chest 2 View Result Date: 06/11/2024 EXAM: 2 VIEW(S) XRAY OF THE CHEST 06/11/2024 06:00:00 PM COMPARISON: 11/15/2021 CLINICAL HISTORY: cp cp FINDINGS: LUNGS AND PLEURA: No focal pulmonary opacity. No pulmonary edema. No pleural effusion. No pneumothorax. HEART AND MEDIASTINUM: No acute abnormality of the cardiac and mediastinal silhouettes. BONES AND SOFT TISSUES: No acute osseous abnormality. IMPRESSION: 1. No acute cardiopulmonary process. Electronically signed by: Franky Crease MD 06/11/2024 06:16 PM EST RP Workstation: [id number]    Pertinent labs & imaging results that were available during my care of the patient were reviewed by me and considered in my medical decision making (see MDM for details).  Medications Ordered in ED Medications - No data to display  Procedures Procedures  (including critical care time)  Medical Decision Making / ED Course   This patient presents to the ED for concern of angina, this involves an extensive number of treatment options, and is a complaint that carries with it a high risk of complications and morbidity.  The differential diagnosis includes  angina, stable angina, unstable angina, chest pain.  MDM: Patient has flat troponins.  Her EKG shows no significant ST changes from prior.  She is not currently have any chest pain.  I evaluated the patient, and discussed with her how her cardiology team was concerned about her story and after recommend admission with possible cath this week.  Patient was very surprised by this, and telling me that she could not be admitted, and that she did not wish to be admitted.  I explained to the patient other concern was that she could be having trouble with her heart may require heart cath.  Patient understood this, however was adamant that she did not wish to be admitted.  Patient has flat troponins and no significant EKG changes.  She is not currently having any pain.  Overall I do believe the patient would benefit from admission, she has capacity to make this decision and understands that going home is certainly riskier than remain in the hospital for potential angiography.  Patient assures me she will return to the emergency room if she starts develop worsening symptoms or persistent pain.  I sent an inbox message to the PA that saw the patient at the cardiology office today, as well as the attending cardiologist.     Additional history obtained: -Additional history obtained from [place] at bedside -External records from outside source obtained and reviewed including: Chart review including previous notes, labs, imaging, consultation notes   Lab Tests: -I ordered, reviewed, and interpreted labs.   The pertinent results include:   Labs Reviewed  BASIC METABOLIC PANEL WITH GFR - Abnormal; Notable for the following components:      Result Value   Glucose, Bld 111 (*)    BUN 29 (*)    Creatinine, Ser 1.90 (*)    Calcium  8.4 (*)    GFR, Estimated 28 (*)    All other components within normal limits  CBC - Abnormal; Notable for the following components:   Hemoglobin 11.1 (*)    MCV 79.7 (*)     MCH 24.4 (*)    RDW 17.1 (*)    All other components within normal limits  TROPONIN I (HIGH SENSITIVITY)  TROPONIN I (HIGH SENSITIVITY)      EKG sinus rhythm, no ST segment depressions or elevations, no acute ischemia.  EKG Interpretation Date/Time:    Ventricular Rate:    PR Interval:    QRS Duration:    QT Interval:    QTC Calculation:   R Axis:      Text Interpretation:           Imaging Studies ordered: I ordered imaging studies including chest x-ray I independently visualized and interpreted imaging. I agree with the radiologist interpretation   Medicines ordered and prescription drug management: No orders of the defined types were placed in this encounter.   -I have reviewed the patients home medicines and have made adjustments as needed   Cardiac Monitoring: The patient was maintained on a cardiac monitor.  I personally viewed and interpreted the cardiac monitored which showed an underlying rhythm of: Normal sinus rhythm  Reevaluation: After the interventions noted above, I reevaluated the  patient and found that they have :improved  Co morbidities that complicate the patient evaluation  Past Medical History:  Diagnosis Date   Acute bronchitis    Anemia    hx of anemia   Anginal pain    Arthritis    osteo   Asthma    Chronic kidney disease (CKD), stage IV (severe) (HCC)    COPD (chronic obstructive pulmonary disease) (HCC)    Coronary atherosclerosis    Cough    GERD (gastroesophageal reflux disease)    History of colon polyps    History of migraine    Hyperlipidemia    Hypertension    Obstructive sleep apnea (adult) (pediatric)    NO CPAP    Peripheral neuropathy    Type II or unspecified type diabetes mellitus without mention of complication, not stated as uncontrolled    type 2   Unspecified hypothyroidism       Dispostion: I considered admission for this patient, however the patient refuses admission, and she has capacity to make that  decision.    Final Clinical Impression(s) / ED Diagnoses Final diagnoses:  None     @PCDICTATION @    Mannie Pac T, DO 06/11/24 2233

## 2024-06-11 NOTE — ED Notes (Signed)
..  Patient is A&Ox4 upon discharge. Patient verbalized understanding of discharge instructions and appropriate follow-up care. Patient ambulatory from ED with steady gait AO x4

## 2024-06-11 NOTE — Progress Notes (Signed)
 Cardiology Office Note   Date:  06/11/2024  ID:  Tejasvi, Brissett 17-Apr-1956, MRN [id number] PCP: Nichole Senior, MD  [facility] Providers Cardiologist:  Debby Sor, MD (Inactive)  -- plan to set up with Dr. Francyne    History of Present Illness [name] is a 68 y.o. female with a hx of CAD, OSA, hypertension, hyperlipidemia, DM 2, CKD stage IIIb, Elspeth Louder syndrome, severe persistent asthma followed by Dr. Neda and hypothyroidism.  Patient underwent DES to proximal RCA in 2007.  She also had concomitant 60 to 70% disease in LAD, 20% disease in left circumflex artery at the time.  Repeat cardiac catheterization in July 2010 showed 60% LAD disease, 20 to 30% left circumflex disease, widely patent RCA stent.  She had a high risk Myoview in 2015 and underwent repeat cardiac catheterization in October 2015 that showed stable anatomy that is unchanged when compared to the previous cath, 50% proximal to mid LAD stenosis with FFR of 0.94.  Echocardiogram in September 2020 showed normal systolic and diastolic function with mild LVH, moderate AI.  She has been followed by Dr. Macel of nephrology service who started her on Kerendia  for renal protection.  Echocardiogram obtained in December 2024 showed EF 60 to 65%, grade 1 DD, mild AI.  Patient was last seen in April 2025 by Dr. Sor.  She did not have any chest discomfort at the time however blood pressure was low.  Lasix  was decreased from 40 mg a.m. and 20 mg p.m. to 40 mg a.m. only.  Metoprolol  succinate was also reduced from 25 mg daily to alternating 25 mg and to 12.5 mg every other day.  Per report, patient has stopped her CPAP for the past 4 years.  She underwent right total hip replacement by Dr. Ernie in May 2025.  Per recent telephone note, patient reportedly fainted 2 weeks ago.  She is also having worsening anginal symptom.  She called her PCP who instructed her to call cardiology service.  Of note, prior to Dr.  Joesphine retirement, he has suggested to the patient to follow-up with Dr. Francyne.  Patient presents today for evaluation of chest discomfort that has been worsening for the past month.  Symptom is also accompanied by worsening dyspnea on exertion as well.  She appears to be euvolemic on exam.  She says the chest discomfort can last several hours at a time and improved with nitroglycerin .  This is reminiscent of the previous angina back in 2007.  Last cardiac catheterization in 2015 showed a stable coronary anatomy.  She also mention she passed out roughly 2 weeks ago.  Although metoprolol  succinate is listed on her medication list, she has not been taking it very consistently, she uses her weight is the cutoff.  If her weight dropped down to the 140s, she would not take the metoprolol .  She would only take the metoprolol  if her weight is in the 150s.  I asked her to use her blood pressure is the cutoff regarding metoprolol  usage, she has been instructed to hold metoprolol  succinate if systolic blood pressure is less than 110.  I also want to discontinue her Maxide to improve her blood pressure.  EKG today showed no significant ST-T wave changes.  However her symptom is very concerning for unstable angina.  I discussed the case with DOD Dr. Jeffrie who recommended sending the patient to the ED.  Given her baseline renal function and a history of anemia, we recommend she  is admitted by hospitalist and cardiology service to consult.  Depend on the renal function and hemoglobin, may consider definitive angiography in the next few days.  ROS:   Patient complains of chest pain.  She has no lower extremity edema, orthopnea or PND.  She has been having worsening dyspnea on exertion  Studies Reviewed EKG Interpretation Date/Time:  Tuesday June 11 2024 15:35:29 EST Ventricular Rate:  66 PR Interval:  160 QRS Duration:  74 QT Interval:  416 QTC Calculation: 436 R Axis:   72  Text Interpretation: Normal  sinus rhythm Nonspecific T wave abnormality When compared with ECG of 29-Nov-2023 11:56, No significant change was found Confirmed by Janene Boer [phone]) on 06/11/2024 3:37:12 PM    Cardiac Studies & Procedures   ______________________________________________________________________________________________     ECHOCARDIOGRAM  ECHOCARDIOGRAM COMPLETE 07/24/2023  Narrative ECHOCARDIOGRAM REPORT    Patient Name:   [name] Date of Exam: 07/24/2023 Medical Rec #:  [id number]         Height:       62.0 in Accession #:    [id number]        Weight:       160.6 lb Date of Birth:  [birth year]         BSA:          1.741 m Patient Age:    67 years          BP:           151/79 mmHg Patient Gender: F                 HR:           84 bpm. Exam Location:  Outpatient  Procedure: 2D Echo, 3D Echo, Cardiac Doppler, Color Doppler and Strain Analysis  Indications:    Essential Hypertension  History:        Patient has prior history of Echocardiogram examinations, most recent 04/23/2019. Risk Factors:Hypertension, Dyslipidemia, Former Smoker and Sleep Apnea.  Sonographer:    Ozell Free Referring Phys: [id number] GORDY BERGAMO   Sonographer Comments: Global longitudinal strain was attempted. IMPRESSIONS   1. Left ventricular ejection fraction, by estimation, is 60 to 65%. The left ventricle has normal function. The left ventricle has no regional wall motion abnormalities. Left ventricular diastolic parameters are consistent with Grade I diastolic dysfunction (impaired relaxation). Elevated left atrial pressure. The average left ventricular global longitudinal strain is -21.8 %. The global longitudinal strain is normal. 2. Right ventricular systolic function is normal. The right ventricular size is normal. 3. The mitral valve is normal in structure. No evidence of mitral valve regurgitation. No evidence of mitral stenosis. 4. The aortic valve has an indeterminant number of cusps. Aortic valve  regurgitation is mild. No aortic stenosis is present. 5. The inferior vena cava is normal in size with greater than 50% respiratory variability, suggesting right atrial pressure of 3 mmHg.  FINDINGS Left Ventricle: Left ventricular ejection fraction, by estimation, is 60 to 65%. The left ventricle has normal function. The left ventricle has no regional wall motion abnormalities. The average left ventricular global longitudinal strain is -21.8 %. The global longitudinal strain is normal. The left ventricular internal cavity size was normal in size. There is no left ventricular hypertrophy. Left ventricular diastolic parameters are consistent with Grade I diastolic dysfunction (impaired relaxation). Elevated left atrial pressure.  Right Ventricle: The right ventricular size is normal. Right ventricular systolic function is normal.  Left Atrium: Left atrial size was normal  in size.  Right Atrium: Right atrial size was normal in size.  Pericardium: There is no evidence of pericardial effusion.  Mitral Valve: The mitral valve is normal in structure. No evidence of mitral valve regurgitation. No evidence of mitral valve stenosis.  Tricuspid Valve: The tricuspid valve is normal in structure. Tricuspid valve regurgitation is not demonstrated. No evidence of tricuspid stenosis.  Aortic Valve: The aortic valve has an indeterminant number of cusps. Aortic valve regurgitation is mild. No aortic stenosis is present. Aortic valve mean gradient measures 5.0 mmHg. Aortic valve peak gradient measures 9.5 mmHg. Aortic valve area, by VTI measures 2.24 cm.  Pulmonic Valve: The pulmonic valve was not well visualized. Pulmonic valve regurgitation is not visualized. No evidence of pulmonic stenosis.  Aorta: The aortic root is normal in size and structure.  Venous: The inferior vena cava is normal in size with greater than 50% respiratory variability, suggesting right atrial pressure of 3 mmHg.  IAS/Shunts: No  atrial level shunt detected by color flow Doppler.   LEFT VENTRICLE PLAX 2D LVIDd:         4.30 cm   Diastology LVIDs:         2.80 cm   LV e' medial:    5.33 cm/s LV PW:         1.10 cm   LV E/e' medial:  16.0 LV IVS:        1.10 cm   LV e' lateral:   11.10 cm/s LVOT diam:     2.00 cm   LV E/e' lateral: 7.7 LV SV:         64 LV SV Index:   37        2D Longitudinal Strain LVOT Area:     3.14 cm  2D Strain GLS Avg:     -21.8 %  3D Volume EF: 3D EF:        71 % LV EDV:       73 ml LV ESV:       21 ml LV SV:        52 ml  RIGHT VENTRICLE             IVC RV Basal diam:  4.00 cm     IVC diam: 1.20 cm RV S prime:     14.60 cm/s TAPSE (M-mode): 1.9 cm  LEFT ATRIUM             Index        RIGHT ATRIUM           Index LA diam:        3.60 cm 2.07 cm/m   RA Area:     22.10 cm LA Vol (A2C):   31.8 ml 18.26 ml/m  RA Volume:   68.50 ml  39.33 ml/m LA Vol (A4C):   29.1 ml 16.71 ml/m LA Biplane Vol: 31.3 ml 17.97 ml/m AORTIC VALVE AV Area (Vmax):    2.12 cm AV Area (Vmean):   2.01 cm AV Area (VTI):     2.24 cm AV Vmax:           154.00 cm/s AV Vmean:          104.000 cm/s AV VTI:            0.286 m AV Peak Grad:      9.5 mmHg AV Mean Grad:      5.0 mmHg LVOT Vmax:         104.00 cm/s LVOT Vmean:  66.600 cm/s LVOT VTI:          0.204 m LVOT/AV VTI ratio: 0.71  AORTA Ao Root diam: 2.60 cm Ao Asc diam:  2.90 cm  MITRAL VALVE MV Area (PHT): 4.17 cm    SHUNTS MV Decel Time: 182 msec    Systemic VTI:  0.20 m MV E velocity: 85.20 cm/s  Systemic Diam: 2.00 cm MV A velocity: 96.40 cm/s MV E/A ratio:  0.88  Redell Shallow MD Electronically signed by Redell Shallow MD Signature Date/Time: 07/24/2023/2:32:11 PM    Final          ______________________________________________________________________________________________      Risk Assessment/Calculations           Physical Exam VS:  BP (!) 104/58   Pulse 77   Wt 155 lb (70.3 kg)   SpO2 96%    BMI 28.35 kg/m        Wt Readings from Last 3 Encounters:  06/11/24 155 lb (70.3 kg)  04/16/24 155 lb (70.3 kg)  01/02/24 156 lb (70.8 kg)    GEN: Well nourished, well developed in no acute distress NECK: No JVD; No carotid bruits CARDIAC: RRR, no murmurs, rubs, gallops RESPIRATORY:  Clear to auscultation without rales, wheezing or rhonchi  ABDOMEN: Soft, non-tender, non-distended EXTREMITIES:  No edema; No deformity   ASSESSMENT AND PLAN  Chest pain: Symptoms concerning for unstable angina.  Is reminiscent of the previous anginal symptom.  Will send the patient to the emergency room for further evaluation.  Patient appears to be stable, discussed with MD, she is to go to ED via private vehicle.  I have messaged cardiology staff, recommend admit by hospitalist service.  Cardiology service to evaluate the patient in the hospital and decide whether or not to proceed with coronary angiography in the next few days depending on her renal function and blood work.  - If decided to proceed with cardiac catheterization, will need to discuss with her benefit and risk of the procedure by hospital staff.  Syncope: Occurred 2 weeks ago.  Likely due to low blood pressure.  I recommend she discontinue Maxide.  He has not been taking metoprolol  succinate consistently in the past few months due to fear of dropping her blood pressure.  With the discontinuation of Maxide, I am hoping that her systolic blood pressure will be above 110 so she can resume metoprolol  succinate for antianginal purpose  CAD: Patient had stent to the RCA in 2007.  She underwent relook cardiac catheterization in 2010 and again in 2015 that showed stable coronary anatomy.  2015 cardiac catheterization was performed as result of positive stress test, the stress test was false positive.  Anemia: Last hemoglobin was 8.2 today after her hip replacement surgery.  Will need to repeat blood work to make sure her anemia improved.     Hypertension: Discontinue Maxide.  Okay to continue metoprolol  succinate if systolic blood pressure is greater than 110.  Hyperlipidemia: On Zetia  and rosuvastatin .  DM2: Per primary  CKD stage III: Followed by Dr. Macel of [facility].  With recent syncope, will need repeat blood work.        Dispo: Follow-up with me in 4 weeks, follow-up with Dr. Francyne in 4 months.  Signed, Scot Ford, PA

## 2024-06-11 NOTE — ED Triage Notes (Signed)
 Pt gives verbal consent for mse

## 2024-06-11 NOTE — Discharge Instructions (Addendum)
 Please call your cardiology office tomorrow for a follow-up appointment.  Return to the emergency department if develop any recurrent chest pain, difficulty breathing, shortness of breath.  Continue to take all medications as prescribed.

## 2024-06-17 ENCOUNTER — Other Ambulatory Visit: Payer: Self-pay

## 2024-06-19 ENCOUNTER — Other Ambulatory Visit (HOSPITAL_COMMUNITY): Payer: Self-pay

## 2024-07-08 ENCOUNTER — Other Ambulatory Visit: Payer: Self-pay

## 2024-07-09 ENCOUNTER — Other Ambulatory Visit: Payer: Self-pay | Admitting: General Practice

## 2024-07-10 ENCOUNTER — Other Ambulatory Visit (HOSPITAL_COMMUNITY): Payer: Self-pay

## 2024-07-10 ENCOUNTER — Other Ambulatory Visit: Payer: Self-pay

## 2024-07-10 NOTE — Progress Notes (Signed)
 Specialty Pharmacy Refill Coordination Note  [name] is a 68 y.o. female contacted today regarding refills of specialty medication(s) Mepolizumab  (Nucala )   Patient requested Delivery   Delivery date: 07/17/24   Verified address: [address] [place] [address]   Medication will be filled on: 07/16/24

## 2024-07-11 MED ORDER — ROSUVASTATIN CALCIUM 20 MG PO TABS: 20.0000 mg | ORAL_TABLET | Freq: Every day | ORAL | 0 refills | Status: DC

## 2024-07-16 ENCOUNTER — Other Ambulatory Visit: Payer: Self-pay

## 2024-08-03 ENCOUNTER — Other Ambulatory Visit: Payer: Self-pay | Admitting: Physician Assistant

## 2024-08-05 ENCOUNTER — Other Ambulatory Visit: Payer: Self-pay

## 2024-08-07 ENCOUNTER — Other Ambulatory Visit: Payer: Self-pay

## 2024-08-09 ENCOUNTER — Other Ambulatory Visit: Payer: Self-pay

## 2024-08-09 NOTE — Progress Notes (Signed)
 Specialty Pharmacy Refill Coordination Note  [name] is a 69 y.o. female contacted today regarding refills of specialty medication(s) Mepolizumab  (Nucala )   Patient requested Delivery   Delivery date: 08/13/24   Verified address: [address] [place] [address]   Medication will be filled on: 08/12/24

## 2024-08-12 ENCOUNTER — Other Ambulatory Visit: Payer: Self-pay

## 2024-08-29 ENCOUNTER — Other Ambulatory Visit: Payer: Self-pay

## 2024-09-03 ENCOUNTER — Other Ambulatory Visit: Payer: Self-pay | Admitting: Pharmacy Technician

## 2024-09-03 ENCOUNTER — Other Ambulatory Visit: Payer: Self-pay

## 2024-09-03 NOTE — Progress Notes (Signed)
 Specialty Pharmacy Refill Coordination Note  [name] is a 69 y.o. female contacted today regarding refills of specialty medication(s) Mepolizumab  (Nucala )   Patient requested Delivery   Delivery date: 09/12/24   Verified address: [address]  [place]   Medication will be filled on: 09/11/24

## 2024-09-11 ENCOUNTER — Other Ambulatory Visit: Payer: Self-pay

## 2024-11-25 ENCOUNTER — Ambulatory Visit: Admitting: Cardiovascular Disease

## 2024-12-11 ENCOUNTER — Ambulatory Visit: Admitting: Pulmonary Disease
